# Patient Record
Sex: Male | Born: 1955
Health system: Southern US, Community
[De-identification: ages and names within clinical notes are randomized; demographics above are authoritative.]

## PROBLEM LIST (undated history)

## (undated) ENCOUNTER — Inpatient Hospital Stay: Admission: EM | Payer: Self-pay | Source: Home / Self Care

## (undated) DIAGNOSIS — H9190 Unspecified hearing loss, unspecified ear: Secondary | ICD-10-CM

## (undated) DIAGNOSIS — I252 Old myocardial infarction: Secondary | ICD-10-CM

## (undated) DIAGNOSIS — F329 Major depressive disorder, single episode, unspecified: Secondary | ICD-10-CM

## (undated) DIAGNOSIS — I10 Essential (primary) hypertension: Secondary | ICD-10-CM

## (undated) DIAGNOSIS — C679 Malignant neoplasm of bladder, unspecified: Secondary | ICD-10-CM

## (undated) DIAGNOSIS — G4730 Sleep apnea, unspecified: Secondary | ICD-10-CM

## (undated) DIAGNOSIS — I701 Atherosclerosis of renal artery: Secondary | ICD-10-CM

## (undated) DIAGNOSIS — H269 Unspecified cataract: Secondary | ICD-10-CM

## (undated) DIAGNOSIS — K219 Gastro-esophageal reflux disease without esophagitis: Secondary | ICD-10-CM

## (undated) DIAGNOSIS — N183 Chronic kidney disease, stage 3 unspecified: Secondary | ICD-10-CM

## (undated) DIAGNOSIS — E785 Hyperlipidemia, unspecified: Secondary | ICD-10-CM

## (undated) DIAGNOSIS — R3915 Urgency of urination: Secondary | ICD-10-CM

## (undated) DIAGNOSIS — K08409 Partial loss of teeth, unspecified cause, unspecified class: Secondary | ICD-10-CM

## (undated) DIAGNOSIS — Z8679 Personal history of other diseases of the circulatory system: Secondary | ICD-10-CM

## (undated) DIAGNOSIS — Z9989 Dependence on other enabling machines and devices: Secondary | ICD-10-CM

## (undated) DIAGNOSIS — D696 Thrombocytopenia, unspecified: Secondary | ICD-10-CM

## (undated) DIAGNOSIS — Z905 Acquired absence of kidney: Secondary | ICD-10-CM

## (undated) DIAGNOSIS — F411 Generalized anxiety disorder: Secondary | ICD-10-CM

## (undated) DIAGNOSIS — F32A Depression, unspecified: Secondary | ICD-10-CM

## (undated) DIAGNOSIS — Q600 Renal agenesis, unilateral: Secondary | ICD-10-CM

## (undated) DIAGNOSIS — J189 Pneumonia, unspecified organism: Secondary | ICD-10-CM

## (undated) DIAGNOSIS — Z794 Long term (current) use of insulin: Secondary | ICD-10-CM

## (undated) DIAGNOSIS — Z973 Presence of spectacles and contact lenses: Secondary | ICD-10-CM

## (undated) DIAGNOSIS — IMO0002 Reserved for concepts with insufficient information to code with codable children: Secondary | ICD-10-CM

## (undated) DIAGNOSIS — G620 Drug-induced polyneuropathy: Secondary | ICD-10-CM

## (undated) DIAGNOSIS — I201 Angina pectoris with documented spasm: Secondary | ICD-10-CM

## (undated) DIAGNOSIS — Z8554 Personal history of malignant neoplasm of ureter: Secondary | ICD-10-CM

## (undated) DIAGNOSIS — Z9221 Personal history of antineoplastic chemotherapy: Secondary | ICD-10-CM

## (undated) DIAGNOSIS — E119 Type 2 diabetes mellitus without complications: Secondary | ICD-10-CM

## (undated) DIAGNOSIS — G4733 Obstructive sleep apnea (adult) (pediatric): Secondary | ICD-10-CM

## (undated) DIAGNOSIS — R351 Nocturia: Secondary | ICD-10-CM

## (undated) DIAGNOSIS — M1990 Unspecified osteoarthritis, unspecified site: Secondary | ICD-10-CM

## (undated) DIAGNOSIS — C651 Malignant neoplasm of right renal pelvis: Secondary | ICD-10-CM

## (undated) DIAGNOSIS — I2510 Atherosclerotic heart disease of native coronary artery without angina pectoris: Secondary | ICD-10-CM

## (undated) DIAGNOSIS — T451X5A Adverse effect of antineoplastic and immunosuppressive drugs, initial encounter: Secondary | ICD-10-CM

## (undated) HISTORY — DX: Atherosclerotic heart disease of native coronary artery without angina pectoris: I25.10

## (undated) HISTORY — DX: Thrombocytopenia, unspecified: D69.6

## (undated) HISTORY — DX: Acquired absence of kidney: Z90.5

## (undated) HISTORY — PX: HERNIA REPAIR: SHX51

## (undated) HISTORY — PX: JOINT REPLACEMENT: SHX530

## (undated) HISTORY — DX: Unspecified cataract: H26.9

## (undated) HISTORY — PX: OTHER SURGICAL HISTORY: SHX169

## (undated) HISTORY — PX: COLON SURGERY: SHX602

## (undated) HISTORY — DX: Hyperlipidemia, unspecified: E78.5

## (undated) HISTORY — DX: Sleep apnea, unspecified: G47.30

## (undated) HISTORY — DX: Malignant neoplasm of right renal pelvis: C65.1

## (undated) HISTORY — DX: Atherosclerosis of renal artery: I70.1

## (undated) HISTORY — DX: Essential (primary) hypertension: I10

## (undated) HISTORY — DX: Malignant neoplasm of bladder, unspecified: C67.9

## (undated) HISTORY — PX: CARDIAC CATHETERIZATION: SHX172

---

## 2002-05-12 HISTORY — PX: OTHER SURGICAL HISTORY: SHX169

## 2002-11-24 ENCOUNTER — Encounter: Admission: RE | Admit: 2002-11-24 | Discharge: 2003-02-22 | Payer: Self-pay | Admitting: Internal Medicine

## 2003-03-20 ENCOUNTER — Encounter: Admission: RE | Admit: 2003-03-20 | Discharge: 2003-03-20 | Payer: Self-pay | Admitting: General Surgery

## 2003-04-28 ENCOUNTER — Other Ambulatory Visit: Admission: RE | Admit: 2003-04-28 | Discharge: 2003-04-28 | Payer: Self-pay | Admitting: Internal Medicine

## 2003-05-13 DIAGNOSIS — I252 Old myocardial infarction: Secondary | ICD-10-CM

## 2003-05-13 DIAGNOSIS — I219 Acute myocardial infarction, unspecified: Secondary | ICD-10-CM

## 2003-05-13 HISTORY — DX: Old myocardial infarction: I25.2

## 2003-05-13 HISTORY — DX: Acute myocardial infarction, unspecified: I21.9

## 2003-05-13 HISTORY — PX: SHOULDER SURGERY: SHX246

## 2003-06-07 ENCOUNTER — Encounter (INDEPENDENT_AMBULATORY_CARE_PROVIDER_SITE_OTHER): Payer: Self-pay | Admitting: Specialist

## 2003-06-07 ENCOUNTER — Ambulatory Visit (HOSPITAL_BASED_OUTPATIENT_CLINIC_OR_DEPARTMENT_OTHER): Admission: RE | Admit: 2003-06-07 | Discharge: 2003-06-07 | Payer: Self-pay | Admitting: Urology

## 2003-06-07 ENCOUNTER — Ambulatory Visit (HOSPITAL_COMMUNITY): Admission: RE | Admit: 2003-06-07 | Discharge: 2003-06-07 | Payer: Self-pay | Admitting: Urology

## 2003-07-07 ENCOUNTER — Ambulatory Visit (HOSPITAL_BASED_OUTPATIENT_CLINIC_OR_DEPARTMENT_OTHER): Admission: RE | Admit: 2003-07-07 | Discharge: 2003-07-07 | Payer: Self-pay | Admitting: Orthopedic Surgery

## 2003-07-07 ENCOUNTER — Ambulatory Visit (HOSPITAL_COMMUNITY): Admission: RE | Admit: 2003-07-07 | Discharge: 2003-07-07 | Payer: Self-pay | Admitting: Orthopedic Surgery

## 2003-07-07 HISTORY — PX: ELBOW SURGERY: SHX618

## 2003-09-08 ENCOUNTER — Inpatient Hospital Stay (HOSPITAL_COMMUNITY): Admission: AD | Admit: 2003-09-08 | Discharge: 2003-09-11 | Payer: Self-pay | Admitting: Internal Medicine

## 2003-11-02 ENCOUNTER — Inpatient Hospital Stay (HOSPITAL_COMMUNITY): Admission: EM | Admit: 2003-11-02 | Discharge: 2003-11-05 | Payer: Self-pay | Admitting: *Deleted

## 2003-11-26 ENCOUNTER — Encounter: Payer: Self-pay | Admitting: Pulmonary Disease

## 2003-11-26 ENCOUNTER — Ambulatory Visit (HOSPITAL_BASED_OUTPATIENT_CLINIC_OR_DEPARTMENT_OTHER): Admission: RE | Admit: 2003-11-26 | Discharge: 2003-11-26 | Payer: Self-pay | Admitting: Internal Medicine

## 2003-11-27 ENCOUNTER — Encounter (HOSPITAL_COMMUNITY): Admission: RE | Admit: 2003-11-27 | Discharge: 2004-02-25 | Payer: Self-pay | Admitting: Cardiology

## 2004-02-08 ENCOUNTER — Ambulatory Visit (HOSPITAL_COMMUNITY): Admission: RE | Admit: 2004-02-08 | Discharge: 2004-02-09 | Payer: Self-pay | Admitting: Orthopedic Surgery

## 2004-02-08 ENCOUNTER — Encounter (INDEPENDENT_AMBULATORY_CARE_PROVIDER_SITE_OTHER): Payer: Self-pay | Admitting: *Deleted

## 2004-02-08 HISTORY — PX: KNEE ARTHROSCOPY W/ DEBRIDEMENT: SHX1867

## 2004-02-26 ENCOUNTER — Encounter (HOSPITAL_COMMUNITY): Admission: RE | Admit: 2004-02-26 | Discharge: 2004-04-27 | Payer: Self-pay | Admitting: Cardiology

## 2004-04-25 ENCOUNTER — Ambulatory Visit: Payer: Self-pay | Admitting: Internal Medicine

## 2004-04-25 ENCOUNTER — Ambulatory Visit: Payer: Self-pay | Admitting: Pulmonary Disease

## 2004-05-23 ENCOUNTER — Ambulatory Visit: Payer: Self-pay | Admitting: Pulmonary Disease

## 2004-06-11 ENCOUNTER — Ambulatory Visit: Payer: Self-pay | Admitting: Internal Medicine

## 2004-08-03 ENCOUNTER — Inpatient Hospital Stay (HOSPITAL_COMMUNITY): Admission: EM | Admit: 2004-08-03 | Discharge: 2004-08-05 | Payer: Self-pay | Admitting: Emergency Medicine

## 2004-08-03 ENCOUNTER — Encounter (INDEPENDENT_AMBULATORY_CARE_PROVIDER_SITE_OTHER): Payer: Self-pay | Admitting: *Deleted

## 2004-08-03 HISTORY — PX: APPENDECTOMY: SHX54

## 2004-10-03 ENCOUNTER — Ambulatory Visit: Payer: Self-pay | Admitting: Internal Medicine

## 2004-11-23 ENCOUNTER — Emergency Department (HOSPITAL_COMMUNITY): Admission: EM | Admit: 2004-11-23 | Discharge: 2004-11-23 | Payer: Self-pay | Admitting: Emergency Medicine

## 2005-05-16 ENCOUNTER — Ambulatory Visit: Payer: Self-pay | Admitting: Internal Medicine

## 2005-07-11 ENCOUNTER — Ambulatory Visit: Payer: Self-pay | Admitting: Pulmonary Disease

## 2005-07-29 ENCOUNTER — Ambulatory Visit: Payer: Self-pay | Admitting: Cardiology

## 2005-07-31 ENCOUNTER — Inpatient Hospital Stay (HOSPITAL_BASED_OUTPATIENT_CLINIC_OR_DEPARTMENT_OTHER): Admission: RE | Admit: 2005-07-31 | Discharge: 2005-07-31 | Payer: Self-pay | Admitting: Internal Medicine

## 2005-07-31 ENCOUNTER — Ambulatory Visit: Payer: Self-pay | Admitting: Internal Medicine

## 2005-08-25 ENCOUNTER — Ambulatory Visit: Payer: Self-pay | Admitting: Internal Medicine

## 2005-10-08 ENCOUNTER — Ambulatory Visit: Payer: Self-pay | Admitting: Internal Medicine

## 2005-12-19 ENCOUNTER — Ambulatory Visit: Payer: Self-pay | Admitting: Internal Medicine

## 2006-02-06 ENCOUNTER — Ambulatory Visit: Payer: Self-pay | Admitting: Internal Medicine

## 2006-02-06 ENCOUNTER — Ambulatory Visit: Payer: Self-pay | Admitting: Gastroenterology

## 2006-02-11 ENCOUNTER — Ambulatory Visit: Payer: Self-pay | Admitting: Pulmonary Disease

## 2006-02-20 ENCOUNTER — Ambulatory Visit: Payer: Self-pay | Admitting: Gastroenterology

## 2006-02-20 ENCOUNTER — Encounter (INDEPENDENT_AMBULATORY_CARE_PROVIDER_SITE_OTHER): Payer: Self-pay | Admitting: Specialist

## 2006-06-01 ENCOUNTER — Ambulatory Visit (HOSPITAL_BASED_OUTPATIENT_CLINIC_OR_DEPARTMENT_OTHER): Admission: RE | Admit: 2006-06-01 | Discharge: 2006-06-01 | Payer: Self-pay | Admitting: Urology

## 2006-06-01 ENCOUNTER — Encounter (INDEPENDENT_AMBULATORY_CARE_PROVIDER_SITE_OTHER): Payer: Self-pay | Admitting: *Deleted

## 2006-08-27 ENCOUNTER — Ambulatory Visit: Payer: Self-pay | Admitting: Internal Medicine

## 2006-10-07 ENCOUNTER — Ambulatory Visit (HOSPITAL_BASED_OUTPATIENT_CLINIC_OR_DEPARTMENT_OTHER): Admission: RE | Admit: 2006-10-07 | Discharge: 2006-10-07 | Payer: Self-pay | Admitting: Urology

## 2006-10-07 ENCOUNTER — Encounter (INDEPENDENT_AMBULATORY_CARE_PROVIDER_SITE_OTHER): Payer: Self-pay | Admitting: Urology

## 2006-10-08 ENCOUNTER — Ambulatory Visit: Payer: Self-pay | Admitting: Internal Medicine

## 2006-12-14 ENCOUNTER — Ambulatory Visit: Payer: Self-pay | Admitting: Internal Medicine

## 2006-12-16 ENCOUNTER — Ambulatory Visit: Payer: Self-pay

## 2007-01-13 ENCOUNTER — Ambulatory Visit (HOSPITAL_BASED_OUTPATIENT_CLINIC_OR_DEPARTMENT_OTHER): Admission: RE | Admit: 2007-01-13 | Discharge: 2007-01-13 | Payer: Self-pay | Admitting: Urology

## 2007-01-13 ENCOUNTER — Encounter (INDEPENDENT_AMBULATORY_CARE_PROVIDER_SITE_OTHER): Payer: Self-pay | Admitting: Urology

## 2007-01-13 HISTORY — PX: TRANSURETHRAL RESECTION OF BLADDER TUMOR: SHX2575

## 2007-01-25 ENCOUNTER — Ambulatory Visit: Payer: Self-pay | Admitting: Internal Medicine

## 2007-01-25 LAB — CONVERTED CEMR LAB
ALT: 54 units/L — ABNORMAL HIGH (ref 0–53)
AST: 34 units/L (ref 0–37)
Albumin: 4 g/dL (ref 3.5–5.2)
Alkaline Phosphatase: 38 units/L — ABNORMAL LOW (ref 39–117)
Bilirubin, Direct: 0.1 mg/dL (ref 0.0–0.3)
Cholesterol: 103 mg/dL (ref 0–200)
HDL: 22.3 mg/dL — ABNORMAL LOW (ref >39.0)
LDL Cholesterol: 57 mg/dL (ref 0–99)
Total Bilirubin: 0.8 mg/dL (ref 0.3–1.2)
Total CHOL/HDL Ratio: 4.6
Total Protein: 6.6 g/dL (ref 6.0–8.3)
Triglycerides: 118 mg/dL (ref 0–149)
VLDL: 24 mg/dL (ref 0–40)

## 2007-04-19 ENCOUNTER — Ambulatory Visit (HOSPITAL_BASED_OUTPATIENT_CLINIC_OR_DEPARTMENT_OTHER): Admission: RE | Admit: 2007-04-19 | Discharge: 2007-04-19 | Payer: Self-pay | Admitting: Urology

## 2007-04-19 ENCOUNTER — Encounter (INDEPENDENT_AMBULATORY_CARE_PROVIDER_SITE_OTHER): Payer: Self-pay | Admitting: Urology

## 2007-05-17 ENCOUNTER — Ambulatory Visit (HOSPITAL_COMMUNITY): Admission: RE | Admit: 2007-05-17 | Discharge: 2007-05-18 | Payer: Self-pay | Admitting: Otolaryngology

## 2007-05-17 HISTORY — PX: OTHER SURGICAL HISTORY: SHX169

## 2007-06-14 ENCOUNTER — Ambulatory Visit: Payer: Self-pay | Admitting: Internal Medicine

## 2007-06-17 ENCOUNTER — Inpatient Hospital Stay (HOSPITAL_BASED_OUTPATIENT_CLINIC_OR_DEPARTMENT_OTHER): Admission: RE | Admit: 2007-06-17 | Discharge: 2007-06-17 | Payer: Self-pay | Admitting: Internal Medicine

## 2007-06-17 ENCOUNTER — Ambulatory Visit: Payer: Self-pay | Admitting: Internal Medicine

## 2007-07-09 ENCOUNTER — Ambulatory Visit: Payer: Self-pay | Admitting: Internal Medicine

## 2007-08-13 ENCOUNTER — Ambulatory Visit (HOSPITAL_BASED_OUTPATIENT_CLINIC_OR_DEPARTMENT_OTHER): Admission: RE | Admit: 2007-08-13 | Discharge: 2007-08-13 | Payer: Self-pay | Admitting: Urology

## 2007-08-13 ENCOUNTER — Encounter (INDEPENDENT_AMBULATORY_CARE_PROVIDER_SITE_OTHER): Payer: Self-pay | Admitting: Urology

## 2007-11-04 ENCOUNTER — Encounter: Payer: Self-pay | Admitting: Pulmonary Disease

## 2007-11-16 DIAGNOSIS — I10 Essential (primary) hypertension: Secondary | ICD-10-CM | POA: Insufficient documentation

## 2007-11-16 DIAGNOSIS — G4733 Obstructive sleep apnea (adult) (pediatric): Secondary | ICD-10-CM | POA: Insufficient documentation

## 2007-11-17 ENCOUNTER — Ambulatory Visit: Payer: Self-pay | Admitting: Pulmonary Disease

## 2007-11-17 DIAGNOSIS — G2581 Restless legs syndrome: Secondary | ICD-10-CM | POA: Insufficient documentation

## 2007-11-19 ENCOUNTER — Encounter: Payer: Self-pay | Admitting: Pulmonary Disease

## 2007-11-25 ENCOUNTER — Ambulatory Visit: Payer: Self-pay | Admitting: Internal Medicine

## 2007-12-20 ENCOUNTER — Encounter: Payer: Self-pay | Admitting: Pulmonary Disease

## 2007-12-30 ENCOUNTER — Ambulatory Visit: Payer: Self-pay | Admitting: Pulmonary Disease

## 2008-02-16 ENCOUNTER — Ambulatory Visit (HOSPITAL_BASED_OUTPATIENT_CLINIC_OR_DEPARTMENT_OTHER): Admission: RE | Admit: 2008-02-16 | Discharge: 2008-02-16 | Payer: Self-pay | Admitting: Urology

## 2008-02-16 HISTORY — PX: OTHER SURGICAL HISTORY: SHX169

## 2008-02-24 ENCOUNTER — Telehealth (INDEPENDENT_AMBULATORY_CARE_PROVIDER_SITE_OTHER): Payer: Self-pay | Admitting: *Deleted

## 2008-03-31 ENCOUNTER — Ambulatory Visit: Payer: Self-pay | Admitting: Internal Medicine

## 2008-05-10 DIAGNOSIS — C679 Malignant neoplasm of bladder, unspecified: Secondary | ICD-10-CM | POA: Insufficient documentation

## 2008-05-10 DIAGNOSIS — E785 Hyperlipidemia, unspecified: Secondary | ICD-10-CM | POA: Insufficient documentation

## 2008-05-10 DIAGNOSIS — I2510 Atherosclerotic heart disease of native coronary artery without angina pectoris: Secondary | ICD-10-CM | POA: Insufficient documentation

## 2008-05-15 ENCOUNTER — Ambulatory Visit: Payer: Self-pay | Admitting: Internal Medicine

## 2008-05-30 ENCOUNTER — Encounter: Payer: Self-pay | Admitting: Pulmonary Disease

## 2008-08-28 ENCOUNTER — Telehealth: Payer: Self-pay | Admitting: Internal Medicine

## 2008-09-13 ENCOUNTER — Encounter (INDEPENDENT_AMBULATORY_CARE_PROVIDER_SITE_OTHER): Payer: Self-pay | Admitting: Urology

## 2008-09-13 ENCOUNTER — Ambulatory Visit (HOSPITAL_BASED_OUTPATIENT_CLINIC_OR_DEPARTMENT_OTHER): Admission: RE | Admit: 2008-09-13 | Discharge: 2008-09-13 | Payer: Self-pay | Admitting: Urology

## 2008-09-25 ENCOUNTER — Encounter: Payer: Self-pay | Admitting: Internal Medicine

## 2008-10-01 ENCOUNTER — Encounter: Payer: Self-pay | Admitting: Internal Medicine

## 2008-10-11 ENCOUNTER — Encounter (INDEPENDENT_AMBULATORY_CARE_PROVIDER_SITE_OTHER): Payer: Self-pay | Admitting: Urology

## 2008-10-11 ENCOUNTER — Ambulatory Visit (HOSPITAL_BASED_OUTPATIENT_CLINIC_OR_DEPARTMENT_OTHER): Admission: RE | Admit: 2008-10-11 | Discharge: 2008-10-11 | Payer: Self-pay | Admitting: Urology

## 2008-10-11 HISTORY — PX: OTHER SURGICAL HISTORY: SHX169

## 2008-10-15 ENCOUNTER — Emergency Department (HOSPITAL_COMMUNITY): Admission: EM | Admit: 2008-10-15 | Discharge: 2008-10-15 | Payer: Self-pay | Admitting: Emergency Medicine

## 2008-10-30 ENCOUNTER — Encounter (INDEPENDENT_AMBULATORY_CARE_PROVIDER_SITE_OTHER): Payer: Self-pay | Admitting: Urology

## 2008-10-30 ENCOUNTER — Inpatient Hospital Stay (HOSPITAL_COMMUNITY): Admission: RE | Admit: 2008-10-30 | Discharge: 2008-11-08 | Payer: Self-pay | Admitting: Urology

## 2008-10-30 HISTORY — PX: OTHER SURGICAL HISTORY: SHX169

## 2008-11-20 ENCOUNTER — Ambulatory Visit: Payer: Self-pay | Admitting: Internal Medicine

## 2009-01-22 ENCOUNTER — Ambulatory Visit: Payer: Self-pay | Admitting: Internal Medicine

## 2009-01-24 ENCOUNTER — Encounter (INDEPENDENT_AMBULATORY_CARE_PROVIDER_SITE_OTHER): Payer: Self-pay | Admitting: *Deleted

## 2009-02-19 ENCOUNTER — Telehealth (INDEPENDENT_AMBULATORY_CARE_PROVIDER_SITE_OTHER): Payer: Self-pay | Admitting: *Deleted

## 2009-02-21 ENCOUNTER — Encounter (INDEPENDENT_AMBULATORY_CARE_PROVIDER_SITE_OTHER): Payer: Self-pay | Admitting: Urology

## 2009-02-21 ENCOUNTER — Ambulatory Visit (HOSPITAL_BASED_OUTPATIENT_CLINIC_OR_DEPARTMENT_OTHER): Admission: RE | Admit: 2009-02-21 | Discharge: 2009-02-21 | Payer: Self-pay | Admitting: Urology

## 2009-02-21 HISTORY — PX: OTHER SURGICAL HISTORY: SHX169

## 2009-03-19 ENCOUNTER — Ambulatory Visit: Payer: Self-pay | Admitting: Internal Medicine

## 2009-04-02 ENCOUNTER — Ambulatory Visit: Payer: Self-pay | Admitting: Internal Medicine

## 2009-05-28 ENCOUNTER — Telehealth: Payer: Self-pay | Admitting: Pulmonary Disease

## 2009-06-05 ENCOUNTER — Ambulatory Visit: Payer: Self-pay | Admitting: Pulmonary Disease

## 2009-08-08 ENCOUNTER — Telehealth: Payer: Self-pay | Admitting: Internal Medicine

## 2009-09-11 ENCOUNTER — Telehealth: Payer: Self-pay | Admitting: Pulmonary Disease

## 2009-09-17 ENCOUNTER — Ambulatory Visit: Payer: Self-pay | Admitting: Internal Medicine

## 2009-09-19 ENCOUNTER — Encounter: Payer: Self-pay | Admitting: Pulmonary Disease

## 2009-10-02 ENCOUNTER — Ambulatory Visit: Payer: Self-pay | Admitting: Internal Medicine

## 2009-10-02 ENCOUNTER — Encounter (INDEPENDENT_AMBULATORY_CARE_PROVIDER_SITE_OTHER): Payer: Self-pay | Admitting: *Deleted

## 2009-10-02 LAB — CONVERTED CEMR LAB
Cholesterol: 93 mg/dL
HDL: 33 mg/dL
LDL Cholesterol: 35 mg/dL
Triglycerides: 126 mg/dL

## 2009-10-22 ENCOUNTER — Ambulatory Visit: Payer: Self-pay | Admitting: Internal Medicine

## 2009-12-28 ENCOUNTER — Encounter: Payer: Self-pay | Admitting: Pulmonary Disease

## 2009-12-28 ENCOUNTER — Telehealth: Payer: Self-pay | Admitting: Pulmonary Disease

## 2010-03-25 ENCOUNTER — Ambulatory Visit: Payer: Self-pay | Admitting: Internal Medicine

## 2010-06-04 ENCOUNTER — Encounter: Payer: Self-pay | Admitting: Pulmonary Disease

## 2010-06-04 ENCOUNTER — Ambulatory Visit
Admission: RE | Admit: 2010-06-04 | Discharge: 2010-06-04 | Payer: Self-pay | Source: Home / Self Care | Attending: Pulmonary Disease | Admitting: Pulmonary Disease

## 2010-06-04 DIAGNOSIS — G4733 Obstructive sleep apnea (adult) (pediatric): Secondary | ICD-10-CM | POA: Insufficient documentation

## 2010-06-11 NOTE — Assessment & Plan Note (Signed)
Summary: rov for osa, RLS   Primary Provider/Referring Provider:  [name]  CC:  Yearly Follow up.  states he is wearing cpap 4-5 nights/wk for approx 8hours each night.  c/o the mask rubbing a sore on nose and states his mouth is very dry when using the cpap even with the humidifier.  states he believes pressure needs to be increased.  also would like to discuss requip dosage-believes this needs to be increased.Marland Kitchen  History of Present Illness: The pt comes in today for f/u of his known osa.  He has been managed on cpap, but has ongoing issues with mask fit and excoriation over the bridge of his nose.  He requires a full face mask due to mouth opening.  He has been wearing cpap compliantly, and his only compliant is that of dryness and ?inadequate pressure.  He uses heated humidifier at highest temperature, but still gets dry.  He has tried a sleeve for his cpap hose as well.  His machine is 55 years old, and may need to be replaced.  He feels that he sleeps fairly well, and denies any significant EDS.  His weight is up 20 pounds over the last one year.  The pt is also on requip for rls, and feels that he is having breakthru in the later evening.  He currently takes his dose in early evening.  Current Medications (verified): 1)  Crestor 10 Mg Tabs (Rosuvastatin Calcium) .... Take 1 Tablet By Mouth At Bedtime 2)  Wellbutrin Xl 300 Mg  Tb24 (Bupropion Hcl) .... Once Daily 3)  Cvs Aspirin Ec 325 Mg  Tbec (Aspirin) .... Once Daily 4)  Protonix 40 Mg  Pack (Pantoprazole Sodium) .... Once Daily 5)  Norvasc 5 Mg  Tabs (Amlodipine Besylate) .... Two Times A Day 6)  Actos 45 Mg  Tabs (Pioglitazone Hcl) .... Once Daily 7)  Toprol Xl 25 Mg  Tb24 (Metoprolol Succinate) .... Once Daily 8)  Requip 1 Mg  Tabs (Ropinirole Hcl) .... Once Daily 9)  Multivitamins   Caps (Multiple Vitamin) .... Take 1 Tablet By Mouth Once A Day 10)  Cpap 11)  Imdur 120 Mg  Tb24 (Isosorbide Mononitrate) .... Once Daily 12)  Janumet ....  Take 1 Tablet By Mouth Once A Day 13)  Glyburide 5 Mg Tabs (Glyburide) .... Take 1 By Mouth Once Daily  Allergies (verified): No Known Drug Allergies  Review of Systems      See HPI  Vital Signs:  Patient profile:   55 year old male Height:      70 inches Weight:      228.38 pounds BMI:     32.89 O2 Sat:      95 % on Room air Temp:     98.3 degrees F oral Pulse rate:   63 / minute BP sitting:   114 / 78  (left arm) Cuff size:   regular  Vitals Entered By: Raymondo Band RN (June 05, 2009 10:24 AM)  O2 Flow:  Room air CC: Yearly Follow up.  states he is wearing cpap 4-5 nights/wk for approx 8hours each night.  c/o the mask rubbing a sore on nose and states his mouth is very dry when using the cpap even with the humidifier.  states he believes pressure needs to be increased.  also would like to discuss requip dosage-believes this needs to be increased. Comments Medications reviewed with patient Daytime contact number verified with patient. Raymondo Band RN  June 05, 2009 10:31 AM  Physical Exam  General:  ow male in nad Nose:  no skin breakdown or pressure necrosis from cpap mask, but there is mild erythema on bridge of nose. Neurologic:  alert, not sleepy, moves all 4.   Impression & Recommendations:  Problem # 1:  SLEEP APNEA (ICD-780.57) the pt has been a very difficult mask fit, and I would like to try a sleep comfort pad for the bridge of his nose for now.  Will also see if he qualifies for a new cpap machine, and can recheck his pressure with autotitrating mode for optimization.  I have stressed to him the need to work on weight loss.  Problem # 2:  RESTLESS LEGS SYNDROME (ICD-333.94) the pt is having a mild increase in symptoms in the evening.  Will try to increase his requip just a little, but if continues, would like to check iron panel for completeness.  It is unclear if his recent increase in symptoms are truly due to RLS.  Medications Added to Medication  List This Visit: 1)  Requip 0.5 Mg Tabs (Ropinirole hcl) .... As directed to complete 1.5 mg dose at bedtime.  Other Orders: Est. Patient Level III [id number]) DME Referral (DME) DME Referral (DME)  Patient Instructions: 1)  can try taking one and a half mg of requip to see if better control of symptoms.  Will send a 30 day prescription for 0.5mg  to try with your 1mg  tab. 2)  will get sleep comfort care pad to help with sore nose. 3)  will see if we can get you a new machine 4)  work on weight loss 5)  follow up with me in one year   Prescriptions: REQUIP 0.5 MG  TABS (ROPINIROLE HCL) as directed to complete 1.5 mg dose at bedtime.  #30 x 0   Entered and Authorized by:   Kathee Delton MD   Signed by:   Kathee Delton MD on 06/05/2009   Method used:   Electronically to        [facility] (mail-order)             ,          Ph: [phone]       Fax: [phone]   [id number] REQUIP 1 MG  TABS (ROPINIROLE HCL) once daily  #90 x 4   Entered and Authorized by:   Kathee Delton MD   Signed by:   Kathee Delton MD on 06/05/2009   Method used:   Electronically to        [facility] (mail-order)             ,          Ph: [phone]       Fax: [phone]   RxID:   [id number]     Immunization History:  Influenza Immunization History:    Influenza:  historical (02/09/2009)  Pneumovax Immunization History:    Pneumovax:  historical (11/09/2008)

## 2010-06-11 NOTE — Miscellaneous (Signed)
Summary: huge mask leak on auto download.   Clinical Lists Changes  Orders: Added new Referral order of DME Referral (DME) - Signed auto shows optimal pressure at 10cm, huge leak, and adequate compliance. needs mask fit and auto for 2 weeks redone.

## 2010-06-11 NOTE — Miscellaneous (Signed)
Summary: lipids  Clinical Lists Changes  Observations: Added new observation of LDL: 35 mg/dL (10/02/2009 12:26) Added new observation of HDL: 33 mg/dL (10/02/2009 12:26) Added new observation of TRIGLYC TOT: 126 mg/dL (10/02/2009 12:26) Added new observation of CHOLESTEROL: 93 mg/dL (10/02/2009 12:26)

## 2010-06-11 NOTE — Assessment & Plan Note (Signed)
Summary: per check out/sf  Medications Added METOPROLOL SUCCINATE 25 MG XR24H-TAB (METOPROLOL SUCCINATE) 1 tab every day      Allergies Added: NKDA  Primary Provider:  [name]   History of Present Illness: Patient is a 55 year old with a history of mild CAD and probable microvascular dz with TIMII II flow in LAD and LCx.  Also a history of DM, dyslipidemia, HTN and bladder CA.  I las saw him in the spring. Currently he is doing fairly well.  He has infrequent chest discomfort.   If he goes on walk he will notice discomfort early that goes away with slowing. Breathing is OK.   Current Medications (verified): 1)  Crestor 10 Mg Tabs (Rosuvastatin Calcium) .... Take 1 Tablet By Mouth At Bedtime 2)  Wellbutrin Xl 300 Mg  Tb24 (Bupropion Hcl) .... Once Daily 3)  Cvs Aspirin Ec 325 Mg  Tbec (Aspirin) .... Once Daily 4)  Protonix 40 Mg  Pack (Pantoprazole Sodium) .... Once Daily 5)  Norvasc 5 Mg  Tabs (Amlodipine Besylate) .... Two Times A Day 6)  Actos 45 Mg  Tabs (Pioglitazone Hcl) .... Once Daily 7)  Toprol Xl 25 Mg  Tb24 (Metoprolol Succinate) .... Once Daily 8)  Requip 1 Mg  Tabs (Ropinirole Hcl) .... Once Daily 9)  Multivitamins   Caps (Multiple Vitamin) .... Take 1 Tablet By Mouth Once A Day 10)  Cpap 11)  Imdur 120 Mg  Tb24 (Isosorbide Mononitrate) .... Once Daily 12)  Janumet .... Take 1 Tablet By Mouth Once A Day 13)  Requip 0.5 Mg  Tabs (Ropinirole Hcl) .... As Directed To Complete 1.5 Mg Dose At Bedtime. 14)  Nitrostat 0.4 Mg Subl (Nitroglycerin) .... Dissolve 1 Tablet Under Tongue Every Five Minutes. Not To Exceed 3 Tablets in Fifteen Minutes As Needed  Allergies (verified): No Known Drug Allergies  Past History:  Past medical, surgical, family and social histories (including risk factors) reviewed, and no changes noted (except as noted below).  Past Medical History: Reviewed history from 05/10/2008 and no changes required. Coronary Artery  Disease Diabetes Hypertension Sleep Apnea Bladder CA followed by dr. [name] Dyslipidema Does not tolerate Niaspan  Past Surgical History: Reviewed history from 05/10/2008 and no changes required. Cystoscopys with tumor resections Umbilical hernia repair [id number] Elbow surgery (R) 2005 Shoulder surgery (R) 2005  Family History: Reviewed history from 05/10/2008 and no changes required. Mom died of CO poisoning Dad with CAD  Social History: Reviewed history from 05/10/2008 and no changes required. Single.  Disabliity from [employer] Occasional EtOH.  Quit tobacco 2003  Review of Systems       All systmes reviewed.  Neg to the above problem except as noted above.  Vital Signs:  Patient profile:   55 year old male Height:      70 inches Weight:      214 pounds BMI:     30.82 Pulse rate:   59 / minute BP sitting:   104 / 72  (left arm) Cuff size:   regular  Vitals Entered By: Hansel Feinstein CMA (March 25, 2010 3:41 PM)  Physical Exam  Additional Exam:  Patient is in NAD HEENT:  Normocephalic, atraumatic. EOMI, PERRLA.  Neck: JVP is normal. No thyromegaly. No bruits.  Lungs: clear to auscultation. No rales no wheezes.  Heart: Regular rate and rhythm. Normal S1, S2. No S3.   No significant murmurs. PMI not displaced.  Abdomen:  Supple, nontender. Normal bowel sounds. No masses. No hepatomegaly.  Extremities:   Good distal pulses throughout. No lower extremity edema.  Musculoskeletal :moving all extremities.  Neuro:   alert and oriented x3.    EKG  Procedure date:  03/25/2010  Findings:      Sinus bradycardia.  59 bpm.  Incomplete RBBB>  Septal MI.  Impression & Recommendations:  Problem # 1:  CAD, NATIVE VESSEL (ICD-414.01) Patient with probable microvasc disease.  I would keeep him on the same regimen.  He can takie NTG before exerciese to help with his endurance.  I encouraged him to stay active.  Problem # 2:  HYPERTENSION (ICD-401.9) Good control. The  following medications were removed from the medication list:    Toprol Xl 25 Mg Tb24 (Metoprolol succinate) ..... Once daily His updated medication list for this problem includes:    Cvs Aspirin Ec 325 Mg Tbec (Aspirin) ..... Once daily    Norvasc 5 Mg Tabs (Amlodipine besylate) .Marland Kitchen..Marland Kitchen Two times a day    Metoprolol Succinate 25 Mg Xr24h-tab (Metoprolol succinate) .Marland Kitchen... 1 tab every day  Problem # 3:  HYPERLIPIDEMIA-MIXED (P102836.4) Lidpids were excelllent.  He can decreased Crestor to 5 mg.  F/U lipid and AST in 3 months.  Waiting for Lipitor to go generic. His updated medication list for this problem includes:    Crestor 10 Mg Tabs (Rosuvastatin calcium) .Marland Kitchen... Take 1 tablet by mouth at bedtime  Other Orders: EKG w/ Interpretation (93000)  Patient Instructions: 1)  Your physician recommends that you return for a FASTING lipid profile: Lipid and ast 272.0 in Feb 2012 2)  Your physician wants you to follow-up in: 12 months with Dr.Ross  You will receive a reminder letter in the mail two months in advance. If you don't receive a letter, please call our office to schedule the follow-up appointment. Prescriptions: CRESTOR 10 MG TABS (ROSUVASTATIN CALCIUM) Take 1 tablet by mouth at bedtime  #90 x 3   Entered by:   Francetta Found, RN, BSN   Authorized by:   Annye Rusk, MD, [facility]   Signed by:   Francetta Found, RN, BSN on 03/25/2010   Method used:   Faxed to ...       [facility] (mail-order)             , [place]         Ph: [phone]       Fax: [phone]   [id number] METOPROLOL SUCCINATE 25 MG XR24H-TAB (METOPROLOL SUCCINATE) 1 tab every day  #90 x 3   Entered by:   Francetta Found, RN, BSN   Authorized by:   Annye Rusk, MD, [facility]   Signed by:   Francetta Found, RN, BSN on 03/25/2010   Method used:   Faxed to ...       [facility] (mail-order)             , [place]         Ph: [phone]       Fax: [phone]   RxID:   [id number]

## 2010-06-11 NOTE — Progress Notes (Signed)
Summary: cpap pressure ----LMTCB for Isaac [name] w/ [facility]  Phone Note Call from Patient Call back at [facility] Phone [phone]   Caller: Patient Call For: Isaac [name] Summary of Call: pt requests to have pressure changed on cpap. says it's still on "test mode".  call pt's home # or cell [phone] Initial call taken by: Cooper Render, CNA,  Sep 11, 2009 4:35 PM  Follow-up for Phone Call        LMOMTCBx1.  [name] LPN  May  3, 624THL 075-GRM PM   The patient states he had 2 week auto dwnload in Feb 2011 and has never been informed of those results. His machine is also still on test mode. He spoke with Isaac [name] at [facility] and she says she faxed the results on 07/23/2009. The patient gave me her number, [phone], and I have left a msg for her to call so we can have results refaxed to the office for [facility]. LMOMTCB for Isaac [name] with [facility]. Follow-up by: Francesca Jewett CMA,  Sep 12, 2009 9:55 AM  Additional Follow-up for Phone Call Additional follow up Details #1::        Isaac [name] is refaxing this download to [facility]. Iasked her to fax it to [phone], attn: Isaac [name]. Isaac [name] pls be on the look out for this. Francesca Jewett CMA  Sep 12, 2009 1:18 PM    Additional Follow-up for Phone Call Additional follow up Details #2::    spoke with Anette at [facility].  was informed that Isaac [name] is out of the office this week on vacation and won't return until Monday and no one else in the office knows how to get the download.  Will call back on Monday and speak to [place].  [name] LPN  May  6, 624THL [id number] AM   spoke with Holley Raring and [facility] and was informed Isaac [name] is still out of office today.  However, Holley Raring was able to pull up download and fax to me.  [name] LPN  May 9, 624THL QA348G AM    Still haven't received download.  called and spoke with Isaac [name] from [facility] and asked her to refax download.  [name] LPN  May 11, 624THL [id number] AM   received download and put in [facility] very important look at folder.   [name] LPN  May 11, 624THL 075-GRM AM   Additional Follow-up for Phone Call Additional follow up Details #3:: Details for Additional Follow-up Action Taken: noted.  will review Additional Follow-up by: Kathee Delton MD,  Sep 19, 2009 2:23 PM

## 2010-06-11 NOTE — Progress Notes (Signed)
Summary: refill   Phone Note Refill Request Call back at Home Phone [phone] Message from:  Patient on August 08, 2009 2:31 PM  Refills Requested: Medication #1:  TOPROL XL 25 MG  TB24 once daily  Medication #2:  CRESTOR 10 MG TABS Take 1 tablet by mouth at bedtime Send to [facility]   Initial call taken by: Delsa Sale,  August 08, 2009 2:32 PM    Prescriptions: TOPROL XL 25 MG  TB24 (METOPROLOL SUCCINATE) once daily  #90 x 3   Entered by:   Burnett Kanaris   Authorized by:   Annye Rusk, MD, [facility]   Signed by:   Burnett Kanaris on 08/10/2009   Method used:   Electronically to        [facility] (mail-order)             ,          Ph: [phone]       Fax: [phone]   [id number] CRESTOR 10 MG TABS (ROSUVASTATIN CALCIUM) Take 1 tablet by mouth at bedtime  #90 x 3   Entered by:   Burnett Kanaris   Authorized by:   Annye Rusk, MD, [facility]   Signed by:   Burnett Kanaris on 08/10/2009   Method used:   Electronically to        [facility] (mail-order)             ,          Ph: [phone]       Fax: [phone]   [id number]

## 2010-06-11 NOTE — Progress Notes (Signed)
Summary: c pap pressure  Phone Note Call from Patient   Caller: Patient Call For: clance Summary of Call: need prescript sent to [facility] for c pap pressure setting Initial call taken by: Gustavus Bryant,  December 28, 2009 1:10 PM  Follow-up for Phone Call        Pt states that he saw [facility] in jan/feb-had auto done-leak; redid test-july 14th faxed results to [facility]-needs pressure set through [facility]. Glenda in [place] office as contact if needed. Pt states he has been on auto mode since July. Please send order for pt to have setting placed. Thanks.Clayborne Dana CMA  December 28, 2009 2:21 PM   Additional Follow-up for Phone Call Additional follow up Details #1::        see clinical list update. Additional Follow-up by: Kathee Delton MD,  December 28, 2009 3:44 PM

## 2010-06-11 NOTE — Progress Notes (Signed)
Summary: prescript  Phone Note Call from Patient   Caller: Patient Call For: clance Summary of Call: need prescript for requip faxed to [facility] Initial call taken by: Gustavus Bryant,  May 28, 2009 1:39 PM  Follow-up for Phone Call        advised pt the he hasn't been seen since 12/2007 so we needed to get him set up for appt before we can give refills. Pt states he didnt realize it had been that long. Pt is scheduled to see [facility] on 06/05/09 at 10:15. Refill sent. pt aware needs to keep this appt for further refills. [place] Bing CMA  May 28, 2009 2:22 PM     Prescriptions: REQUIP 1 MG  TABS (ROPINIROLE HCL) once daily  #90 x 0   Entered by:   [place] Bing CMA   Authorized by:   Kathee Delton MD   Signed by:   [place] Bing CMA on 05/28/2009   Method used:   Electronically to        [facility] (mail-order)             ,          Ph: [phone]       Fax: [phone]   [id number]

## 2010-06-11 NOTE — Miscellaneous (Signed)
Summary: auto shows poor compliance and optimal pressure 12cm   Clinical Lists Changes  Orders: Added new Referral order of DME Referral (DME) - Signed auto shows that pt wore cpap only 18 days out of 30, and only 53% of time more than 4 hours. needs to improve compliance.  no signif. leak.   optimal pressure 12cm.

## 2010-06-19 ENCOUNTER — Encounter: Payer: Self-pay | Admitting: Pulmonary Disease

## 2010-06-19 ENCOUNTER — Other Ambulatory Visit (INDEPENDENT_AMBULATORY_CARE_PROVIDER_SITE_OTHER): Payer: Medicare Other

## 2010-06-19 ENCOUNTER — Other Ambulatory Visit: Payer: Self-pay

## 2010-06-19 DIAGNOSIS — E785 Hyperlipidemia, unspecified: Secondary | ICD-10-CM

## 2010-06-19 DIAGNOSIS — R0989 Other specified symptoms and signs involving the circulatory and respiratory systems: Secondary | ICD-10-CM

## 2010-06-19 LAB — LIPID PANEL
Cholesterol: 94 mg/dL (ref 0–200)
HDL: 19.9 mg/dL — ABNORMAL LOW (ref >39.00)
LDL Cholesterol: 35 mg/dL (ref 0–99)
Total CHOL/HDL Ratio: 5
Triglycerides: 195 mg/dL — ABNORMAL HIGH (ref 0.0–149.0)
VLDL: 39 mg/dL (ref 0.0–40.0)

## 2010-06-19 LAB — HEPATIC FUNCTION PANEL
ALT: 79 U/L — ABNORMAL HIGH (ref 0–53)
AST: 50 U/L — ABNORMAL HIGH (ref 0–37)
Albumin: 4.2 g/dL (ref 3.5–5.2)
Alkaline Phosphatase: 58 U/L (ref 39–117)
Bilirubin, Direct: 0.2 mg/dL (ref 0.0–0.3)
Total Bilirubin: 0.8 mg/dL (ref 0.3–1.2)
Total Protein: 6.6 g/dL (ref 6.0–8.3)

## 2010-06-19 NOTE — Medication Information (Signed)
Summary: [facility]  Rop[facility]   Imported By: Bubba Hales 06/11/2010 11:29:03  _____________________________________________________________________  External Attachment:    Type:   Image     Comment:   External Document

## 2010-06-19 NOTE — Assessment & Plan Note (Signed)
Summary: Isaac [name], Isaac [name]   Primary Provider/Referring Provider:  [name]  CC:  1 year f/u appt on [name] and Isaac [name].  Pt states Requip is helping with Isaac [name].  states he wears his cpap machine every night.  Approx 6 to 7 hours per night.  C/o problems with mask leaking.  Denies any complaints with pressure.  Marland Kitchen  History of Present Illness: the pt comes in today for f/u of his [name] and Isaac [name].  He is doing well with cpap, and has lost weight since his last visit.  He denies any issues with the cpap pressure, but is having some mask leaks.  He is due for a new set of seals.  He feels that he sleeps well, and is satisfied with his daytime alertness.  He continues a dopamine agonist for his Isaac [name], and is not having issues with this.    Medications Prior to Update: 1)  Crestor 10 Mg Tabs (Rosuvastatin Calcium) .... Take 1 Tablet By Mouth At Bedtime 2)  Wellbutrin Xl 300 Mg  Tb24 (Bupropion Hcl) .... Once Daily 3)  Cvs Aspirin Ec 325 Mg  Tbec (Aspirin) .... Once Daily 4)  Protonix 40 Mg  Pack (Pantoprazole Sodium) .... Once Daily 5)  Norvasc 5 Mg  Tabs (Amlodipine Besylate) .... Two Times A Day 6)  Actos 45 Mg  Tabs (Pioglitazone Hcl) .... Once Daily 7)  Requip 1 Mg  Tabs (Ropinirole Hcl) .... Once Daily 8)  Requip 0.5 Mg  Tabs (Ropinirole Hcl) .... As Directed To Complete 1.5 Mg Dose At Bedtime. 9)  Multivitamins   Caps (Multiple Vitamin) .... Take 1 Tablet By Mouth Once A Day 10)  Cpap 11)  Imdur 120 Mg  Tb24 (Isosorbide Mononitrate) .... Once Daily 12)  Janumet .... Take 1 Tablet By Mouth Once A Day 13)  Nitrostat 0.4 Mg Subl (Nitroglycerin) .... Dissolve 1 Tablet Under Tongue Every Five Minutes. Not To Exceed 3 Tablets in Fifteen Minutes As Needed 14)  Metoprolol Succinate 25 Mg Xr24h-Tab (Metoprolol Succinate) .Marland Kitchen.. 1 Tab Every Day  Allergies (verified): No Known Drug Allergies  Review of Systems       The patient complains of shortness of breath with activity, productive cough, chest pain, acid  heartburn, nasal congestion/difficulty breathing through nose, sneezing, and ear ache.  The patient denies shortness of breath at rest, non-productive cough, coughing up blood, irregular heartbeats, indigestion, loss of appetite, weight change, abdominal pain, difficulty swallowing, sore throat, tooth/dental problems, headaches, itching, anxiety, depression, hand/feet swelling, joint stiffness or pain, rash, change in color of mucus, and fever.    Vital Signs:  Patient profile:   55 year old male Height:      70 inches Weight:      215.50 pounds BMI:     31.03 O2 Sat:      98 % on Room air Temp:     98.0 degrees F oral Pulse rate:   71 / minute BP sitting:   124 / 70  (right arm) Cuff size:   regular  Vitals Entered By: [name] LPN (January 24, [id number] 10:08 AM)  O2 Flow:  Room air CC: 1 year f/u appt on [name] and Isaac [name].  Pt states Requip is helping with Isaac [name].  states he wears his cpap machine every night.  Approx 6 to 7 hours per night.  C/o problems with mask leaking.  Denies any complaints with pressure.   Comments Medications reviewed with patient [name] LPN  January 24, [id number] 10:10 AM  Physical Exam  General:  ow male in nad Nose:  no skin breakdown or pressure necrosis from cpap mask Extremities:  no edema or cyanosis noted. Neurologic:  alert, and oriented, moves all 4. does not appear sleepy.   Impression & Recommendations:  Problem # 1:  OBSTRUCTIVE SLEEP APNEA (ICD-327.23)  the pt overall is doing well with cpap.  He feels it has really helped his sleep and daytime alertness.  He is having some mask leaks that I suspect is due to old seals, although he may have to consider a different style of mask.  He will contact dme to correct this.  He has lost weight since his last visit, and I have encouraged him to continue.  Problem # 2:  RESTLESS LEGS SYNDROME (ICD-333.94)  he is doing well with requip.  will continue on this.  Other Orders: Est. Patient Level III  [id number])  Patient Instructions: 1)  try getting new seal for your mask.  If this does not take care of leak, may have to consider a different type of mask 2)  turn heat up on humidifier to try and get more moisture. 3)  continue to work on weight loss.  You are doing well. 4)  followup with me in one year, but call if having issues.

## 2010-08-05 ENCOUNTER — Telehealth: Payer: Self-pay | Admitting: Cardiovascular Disease

## 2010-08-05 NOTE — Telephone Encounter (Signed)
LOV faxed to [facility] @ [place] @ [phone]

## 2010-08-07 ENCOUNTER — Ambulatory Visit (HOSPITAL_BASED_OUTPATIENT_CLINIC_OR_DEPARTMENT_OTHER)
Admission: RE | Admit: 2010-08-07 | Discharge: 2010-08-07 | Disposition: A | Discharge status: Home / Self Care | Payer: Medicare Other | Source: Ambulatory Visit | Attending: Urology | Admitting: Urology

## 2010-08-07 ENCOUNTER — Other Ambulatory Visit: Payer: Self-pay | Admitting: Urology

## 2010-08-07 DIAGNOSIS — Z8559 Personal history of malignant neoplasm of other urinary tract organ: Secondary | ICD-10-CM | POA: Insufficient documentation

## 2010-08-07 DIAGNOSIS — C679 Malignant neoplasm of bladder, unspecified: Secondary | ICD-10-CM | POA: Insufficient documentation

## 2010-08-07 HISTORY — PX: OTHER SURGICAL HISTORY: SHX169

## 2010-08-07 LAB — POCT I-STAT 4, (NA,K, GLUC, HGB,HCT)
Glucose, Bld: 99 mg/dL (ref 70–99)
HCT: 42 % (ref 39.0–52.0)
Hemoglobin: 14.3 g/dL (ref 13.0–17.0)
Potassium: 3.9 mEq/L (ref 3.5–5.1)
Sodium: 141 mEq/L (ref 135–145)

## 2010-08-07 LAB — GLUCOSE, CAPILLARY: Glucose-Capillary: 94 mg/dL (ref 70–99)

## 2010-08-12 NOTE — Op Note (Signed)
NAMEMarland Kitchen  DURON, HUCKLEBY NO.:  [id number]  MEDICAL RECORD NO.:  [id number]          PATIENT TYPE:  LOCATION:                                 FACILITY:  PHYSICIAN:  Bernestine Amass, M.D.  DATE OF BIRTH:  1955-12-26  DATE OF PROCEDURE:  08/07/2010 DATE OF DISCHARGE:                              OPERATIVE REPORT   PREOPERATIVE DIAGNOSES: 1. Transitional cell carcinoma of the bladder. 2. 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  POSTOPERATIVE DIAGNOSES: 1. Transitional cell carcinoma of the bladder. 2. 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  PROCEDURE PERFORMED:  Cystoscopy, cold cup resection of bladder tumor, fulguration with Bugbee electrode, cystogram, left retrograde pyelogram with fluoroscopic interpretation, right rigid ureteroscopy.  SURGEON:  Bernestine Amass, M.D.  ANESTHESIA:  General.  INDICATIONS:  [name] is 55 years of age.  The patient has a history of transitional cell carcinoma of the bladder and also underwent distal right ureterectomy with right distal ureteral reimplantation in 2010.  The patient had some evidence of potential papillary tumor recurrence on recent assessment.  The patient did have a CT scan of his abdomen and pelvis in the fall of 2011, which showed no evidence of obstruction, obvious ureteral or bladder tumors at that time.  The patient now presents for an entire reevaluation of his urothelium as well as cold cup resection of what appears to be papillary recurrent tumor.  TECHNIQUE AND FINDINGS:  The patient was brought to the operating room where he had successful induction of general anesthesia.  He was placed in the lithotomy position and prepped and draped in the usual manner. Appropriate surgical time-out was performed.  The patient  received perioperative ciprofloxacin.  He also had placement of PAS compression boots.  Initial cystoscopy revealed unremarkable anterior urethra and prostatic urethra.  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  We utilized a cold cup resection forceps to resect most of the visible tumor, which was sent for pathologic analysis.  The surrounding mucosa and biopsy sites were fulgurated with a Bugbee electrode.  A cystogram was done with a red Robinson catheter with fluoroscopic interpretation.  The bladder was filled with approximately 500 cc of dilute contrast.  Bladder contour was normal.  One could see some reflux up the right ureter without obvious filling defects or obstruction.  The bladder was then drained.  A cystoscope was then reinserted.  A left retrograde pyelogram was done with a cone-tip catheter.  The left ureter appeared delicate without obvious obstruction or filling defects.  Fluoroscopic interpretation was performed.  With the cystoscope in position, a guidewire was placed up to the right renal pelvis through the neo-orifice.  Rigid ureteroscopy was then performed to the ureteropelvic junction with no sign of recurrent tumor. The bladder was drained.  Lidocaine jelly was instilled and the patient was brought to the recovery room in stable condition.  Bernestine Amass, M.D.     DSG/MEDQ  D:  08/07/2010  T:  08/07/2010  Job:  [id number]  Electronically Signed by Rana Snare M.D. on 08/12/2010 12:06:51 PM

## 2010-08-15 LAB — GLUCOSE, CAPILLARY: Glucose-Capillary: 135 mg/dL — ABNORMAL HIGH (ref 70–99)

## 2010-08-15 LAB — BASIC METABOLIC PANEL
BUN: 13 mg/dL (ref 6–23)
CO2: 26 mEq/L (ref 19–32)
Calcium: 9 mg/dL (ref 8.4–10.5)
Chloride: 102 mEq/L (ref 96–112)
Creatinine, Ser: 0.9 mg/dL (ref 0.4–1.5)
GFR calc Af Amer: >60 mL/min (ref >60)
GFR calc non Af Amer: >60 mL/min (ref >60)
Glucose, Bld: 93 mg/dL (ref 70–99)
Potassium: 3.9 mEq/L (ref 3.5–5.1)
Sodium: 134 mEq/L — ABNORMAL LOW (ref 135–145)

## 2010-08-15 LAB — HEMOGLOBIN: Hemoglobin: 14.7 g/dL (ref 13.0–17.0)

## 2010-08-19 LAB — URINALYSIS, ROUTINE W REFLEX MICROSCOPIC
Bilirubin Urine: NEGATIVE
Glucose, UA: NEGATIVE mg/dL
Glucose, UA: NEGATIVE mg/dL
Ketones, ur: NEGATIVE mg/dL
Nitrite: NEGATIVE
Protein, ur: 100 mg/dL — AB
Protein, ur: 30 mg/dL — AB
Specific Gravity, Urine: 1.006 (ref 1.005–1.030)
Specific Gravity, Urine: 1.012 (ref 1.005–1.030)
Urobilinogen, UA: 0.2 mg/dL (ref 0.0–1.0)
Urobilinogen, UA: 0.2 mg/dL (ref 0.0–1.0)
pH: 5.5 (ref 5.0–8.0)

## 2010-08-19 LAB — CBC
HCT: 41.2 % (ref 39.0–52.0)
HCT: 41.2 % (ref 39.0–52.0)
Hemoglobin: 14.1 g/dL (ref 13.0–17.0)
Hemoglobin: 13.8 g/dL (ref 13.0–17.0)
MCHC: 33.9 g/dL (ref 30.0–36.0)
MCHC: 34.4 g/dL (ref 30.0–36.0)
MCV: 96.7 fL (ref 78.0–100.0)
Platelets: 177 10*3/uL (ref 150–400)
RBC: 3.34 MIL/uL — ABNORMAL LOW (ref 4.22–5.81)
RBC: 4.26 MIL/uL (ref 4.22–5.81)
RBC: 4.23 MIL/uL (ref 4.22–5.81)
RDW: 12.4 % (ref 11.5–15.5)
WBC: 5 10*3/uL (ref 4.0–10.5)
WBC: 5.4 10*3/uL (ref 4.0–10.5)
WBC: 7.7 10*3/uL (ref 4.0–10.5)

## 2010-08-19 LAB — BASIC METABOLIC PANEL
BUN: 10 mg/dL (ref 6–23)
BUN: 13 mg/dL (ref 6–23)
BUN: 16 mg/dL (ref 6–23)
CO2: 22 mEq/L (ref 19–32)
CO2: 26 mEq/L (ref 19–32)
CO2: 29 mEq/L (ref 19–32)
Calcium: 8.4 mg/dL (ref 8.4–10.5)
Calcium: 8.4 mg/dL (ref 8.4–10.5)
Calcium: 8.5 mg/dL (ref 8.4–10.5)
Calcium: 8.9 mg/dL (ref 8.4–10.5)
Calcium: 9.2 mg/dL (ref 8.4–10.5)
Chloride: 103 mEq/L (ref 96–112)
Chloride: 104 mEq/L (ref 96–112)
Chloride: 105 mEq/L (ref 96–112)
Creatinine, Ser: 0.9 mg/dL (ref 0.4–1.5)
Creatinine, Ser: 0.95 mg/dL (ref 0.4–1.5)
Creatinine, Ser: 1.03 mg/dL (ref 0.4–1.5)
GFR calc Af Amer: 58 mL/min — ABNORMAL LOW (ref >60)
GFR calc Af Amer: >60 mL/min (ref >60)
GFR calc Af Amer: >60 mL/min (ref >60)
GFR calc Af Amer: >60 mL/min (ref >60)
GFR calc Af Amer: >60 mL/min (ref >60)
GFR calc non Af Amer: 48 mL/min — ABNORMAL LOW (ref >60)
GFR calc non Af Amer: >60 mL/min (ref >60)
GFR calc non Af Amer: >60 mL/min (ref >60)
GFR calc non Af Amer: >60 mL/min (ref >60)
Glucose, Bld: 119 mg/dL — ABNORMAL HIGH (ref 70–99)
Glucose, Bld: 138 mg/dL — ABNORMAL HIGH (ref 70–99)
Glucose, Bld: 198 mg/dL — ABNORMAL HIGH (ref 70–99)
Potassium: 3.6 mEq/L (ref 3.5–5.1)
Potassium: 4.1 mEq/L (ref 3.5–5.1)
Potassium: 4.2 mEq/L (ref 3.5–5.1)
Potassium: 5.1 mEq/L (ref 3.5–5.1)
Potassium: 4 mEq/L (ref 3.5–5.1)
Sodium: 133 mEq/L — ABNORMAL LOW (ref 135–145)
Sodium: 136 mEq/L (ref 135–145)
Sodium: 136 mEq/L (ref 135–145)
Sodium: 135 mEq/L (ref 135–145)

## 2010-08-19 LAB — GLUCOSE, CAPILLARY
Glucose-Capillary: 110 mg/dL — ABNORMAL HIGH (ref 70–99)
Glucose-Capillary: 110 mg/dL — ABNORMAL HIGH (ref 70–99)
Glucose-Capillary: 111 mg/dL — ABNORMAL HIGH (ref 70–99)
Glucose-Capillary: 114 mg/dL — ABNORMAL HIGH (ref 70–99)
Glucose-Capillary: 117 mg/dL — ABNORMAL HIGH (ref 70–99)
Glucose-Capillary: 119 mg/dL — ABNORMAL HIGH (ref 70–99)
Glucose-Capillary: 119 mg/dL — ABNORMAL HIGH (ref 70–99)
Glucose-Capillary: 121 mg/dL — ABNORMAL HIGH (ref 70–99)
Glucose-Capillary: 122 mg/dL — ABNORMAL HIGH (ref 70–99)
Glucose-Capillary: 123 mg/dL — ABNORMAL HIGH (ref 70–99)
Glucose-Capillary: 123 mg/dL — ABNORMAL HIGH (ref 70–99)
Glucose-Capillary: 124 mg/dL — ABNORMAL HIGH (ref 70–99)
Glucose-Capillary: 126 mg/dL — ABNORMAL HIGH (ref 70–99)
Glucose-Capillary: 128 mg/dL — ABNORMAL HIGH (ref 70–99)
Glucose-Capillary: 128 mg/dL — ABNORMAL HIGH (ref 70–99)
Glucose-Capillary: 134 mg/dL — ABNORMAL HIGH (ref 70–99)
Glucose-Capillary: 134 mg/dL — ABNORMAL HIGH (ref 70–99)
Glucose-Capillary: 155 mg/dL — ABNORMAL HIGH (ref 70–99)
Glucose-Capillary: 170 mg/dL — ABNORMAL HIGH (ref 70–99)
Glucose-Capillary: 86 mg/dL (ref 70–99)
Glucose-Capillary: 88 mg/dL (ref 70–99)
Glucose-Capillary: 90 mg/dL (ref 70–99)
Glucose-Capillary: 96 mg/dL (ref 70–99)
Glucose-Capillary: 97 mg/dL (ref 70–99)
Glucose-Capillary: 98 mg/dL (ref 70–99)
Glucose-Capillary: 98 mg/dL (ref 70–99)
Glucose-Capillary: 94 mg/dL (ref 70–99)

## 2010-08-19 LAB — HEMOGLOBIN AND HEMATOCRIT, BLOOD
HCT: 37.7 % — ABNORMAL LOW (ref 39.0–52.0)
HCT: 38.1 % — ABNORMAL LOW (ref 39.0–52.0)
Hemoglobin: 12.8 g/dL — ABNORMAL LOW (ref 13.0–17.0)
Hemoglobin: 13 g/dL (ref 13.0–17.0)

## 2010-08-19 LAB — DIFFERENTIAL
Basophils Absolute: 0 10*3/uL (ref 0.0–0.1)
Basophils Relative: 0 % (ref 0–1)
Eosinophils Relative: 1 % (ref 0–5)
Lymphocytes Relative: 21 % (ref 12–46)
Lymphs Abs: 1.6 10*3/uL (ref 0.7–4.0)
Monocytes Absolute: 1 10*3/uL (ref 0.1–1.0)
Monocytes Relative: 11 % (ref 3–12)
Monocytes Relative: 14 % — ABNORMAL HIGH (ref 3–12)
Neutro Abs: 2.8 10*3/uL (ref 1.7–7.7)
Neutro Abs: 4.9 10*3/uL (ref 1.7–7.7)
Neutrophils Relative %: 57 % (ref 43–77)

## 2010-08-19 LAB — POCT I-STAT 4, (NA,K, GLUC, HGB,HCT)
HCT: 45 % (ref 39.0–52.0)
Hemoglobin: 15.3 g/dL (ref 13.0–17.0)

## 2010-08-19 LAB — ABO/RH: ABO/RH(D): A POS

## 2010-08-19 LAB — URINE MICROSCOPIC-ADD ON

## 2010-08-19 LAB — COMPREHENSIVE METABOLIC PANEL
ALT: 27 U/L (ref 0–53)
AST: 29 U/L (ref 0–37)
Albumin: 3 g/dL — ABNORMAL LOW (ref 3.5–5.2)
Alkaline Phosphatase: 33 U/L — ABNORMAL LOW (ref 39–117)
GFR calc Af Amer: >60 mL/min (ref >60)
Glucose, Bld: 87 mg/dL (ref 70–99)
Potassium: 3.8 mEq/L (ref 3.5–5.1)
Sodium: 141 mEq/L (ref 135–145)
Total Protein: 5.3 g/dL — ABNORMAL LOW (ref 6.0–8.3)

## 2010-08-19 LAB — TYPE AND SCREEN
ABO/RH(D): A POS
Antibody Screen: NEGATIVE

## 2010-08-19 LAB — URINE CULTURE

## 2010-08-20 LAB — GLUCOSE, CAPILLARY: Glucose-Capillary: 131 mg/dL — ABNORMAL HIGH (ref 70–99)

## 2010-08-20 LAB — POCT I-STAT 4, (NA,K, GLUC, HGB,HCT)
Glucose, Bld: 120 mg/dL — ABNORMAL HIGH (ref 70–99)
HCT: 45 % (ref 39.0–52.0)
Hemoglobin: 15.3 g/dL (ref 13.0–17.0)

## 2010-08-30 ENCOUNTER — Other Ambulatory Visit: Payer: Self-pay | Admitting: Internal Medicine

## 2010-09-24 NOTE — Op Note (Signed)
Isaac [name], Isaac [name]             ACCOUNT NO.:  [id number]   MEDICAL RECORD NO.:  [id number]          PATIENT TYPE:  AMB   LOCATION:  [facility]                         [facility]ACILITY[facility]   PHYSICIAN:  Lucina Mellow. [name], M.D.DATE OF BIRTH:  [birth year]   DATE OF PROCEDURE:  01/13/2007  DATE OF DISCHARGE:                               OPERATIVE REPORT   PREOPERATIVE DIAGNOSIS:  History of bladder carcinoma.   POSTOPERATIVE DIAGNOSIS:  History of bladder carcinoma, recurrent  bladder tumor and urethral meatal stenosis.   PROCEDURE:  Cystoscopy, urethral meatal dilation, transurethral  resection of bladder tumor and cold cup bladder biopsies.   SURGEON:  Dr. Hessie Diener.   ANESTHESIA:  General.   INDICATIONS:  This 55 year old gentleman has history of bladder  carcinoma going back to January 2005 and then was treated with BCG.  He  had a recurrent bladder tumor earlier this year and underwent BCG  therapy again along with Intron-A.  He is brought to the OR today for  further evaluation and treatment.   PROCEDURE:  The patient brought to the operating room, placed lithotomy  position and external genitalia were prepped and draped in usual  fashion.  He was cystoscoped but only after dilating his urethral  meatus.  Anterior urethra was otherwise normal, prostatic urethra was  unremarkable, bladder had low capacity and a small papillary tumor on  the floor.  Rest of bladder looked fine except for minimal area of  erythema on the dome.  The small bladder tumor on the floor was resected  and sent to pathology along with biopsies from the dome and left wall  and right wall.  All biopsy sites were fulgurated with the Bugbee  electrode and at this time, the bladder was emptied, scope removed.  He  had good hemostasis and an essentially no blood loss.      Lucina Mellow. [name], M.D.  Electronically Signed     LJP/MEDQ  D:  01/13/2007  T:  01/13/2007  Job:  [id number]

## 2010-09-24 NOTE — Assessment & Plan Note (Signed)
[facility] OFFICE NOTE   NAME:O'REILLYLamin, Mcqueary                     MRN:          [id number]  DATE:07/09/2007                            DOB:          [birth year]    IDENTIFICATION:  [name] is a 55 year old gentleman with a history  of CAD (felt to have microvascular disease with a decreased flow).  I  last saw him in early February.   When I saw him his symptoms were worsening and I went ahead and set him  up for another cardiac catheterization to be done.  This was done by Dr.  Pierre Bali on February 5.  The LAD was a moderate-sized vessel, gave  off a moderate to large diagonal in the mid section, several smaller  diagonals.  There was a 30% proximal lesion.  The mid to distal LAD  appeared to have mild to moderate diffuse disease but also appeared to  have a component of vasospasm.  Distally it had TIMI 2 flow.  The left  circumflex gave off a large OM and three PLSAs, normal with borderline  TIMI 2 flow.  The RCA was moderate, codominant vessel.  Flow was just  minimally diminished.  LVF was 60%.   With these findings it was recommended that he probably had  microvascular disease and a possible component of vasospasm, plan to  maximize medical therapy and, again, refrain from smoking.   The patient continues to have episodes of chest pain especially if he  gets stressed.   CURRENT MEDICATIONS:  1. Wellbutrin XL 300.  2. Effexor 150.  3. Clonazepam 0.5 b.i.d.  4. Aspirin 325.  5. Protonix 40.  6. Norvasc 5 b.i.d.  7. Januvia 100.  8. Actos 45.  9. Toprol-XL 25.  10.ReQuip 1.  11.Multivitamin.  12.CPAP.  13.Crestor 20.  14.Imdur 120.   PHYSICAL EXAM:  The patient is in no distress at rest.  Blood pressure  is 132/81, pulse 74, weight 209, up 2 pounds from previous.  LUNGS:  Clear.  NECK:  JVP is normal.  CARDIAC EXAM:  Regular rate and rhythm.  S1, S2.  No S3, no murmurs.  ABDOMEN:   Benign.  EXTREMITIES:  No edema.   IMPRESSION:  1. Microvascular disease with chest pain.  I am going to try to      maximize his nitrates, increase Imdur to 180, also to increase his      Toprol-XL to 37.5.  I question if Ranexa would be useful in this      gentleman.  I think stress management is critical for him.  I think      if he gets adrenaline rises he will only have further vasospasm and      I think stress management and avoiding stressful situations is      imperative.  2. Dyslipidemia.  Continue on Crestor 20.  Will need to have follow-up      lipids at some point.  Note, his HDL is 22.  He failed Niaspan.   I would like to see the patient back in a  few months; again, thoughts  towards other medical therapy.     [name], MD, [facility]  Electronically Signed    PVR/MedQ  DD: 07/09/2007  DT: 07/11/2007  Job #: [id number]   cc:   [name]

## 2010-09-24 NOTE — Op Note (Signed)
Isaac [name], Isaac [name]             ACCOUNT NO.:  [id number]   MEDICAL RECORD NO.:  [id number]          PATIENT TYPE:  AMB   LOCATION:  [facility]                         [facility]ACILITY:  [facility]   PHYSICIAN:  Lucina Mellow. [name], M.D.DATE OF BIRTH:  1955-11-10   DATE OF PROCEDURE:  10/07/2006  DATE OF DISCHARGE:                               OPERATIVE REPORT   PREOPERATIVE DIAGNOSIS:  Rule out recurrent bladder carcinoma.   POSTOPERATIVE DIAGNOSES:  1. Rule out recurrent bladder carcinoma.  2. Urethral meatal stenosis.   PROCEDURE:  Cystoscopy, urethral meatal dilation, bilateral retrograde  pyelograms with interpretation, resection of small bladder tumor and  random bladder biopsies.   SURGEON:  Dr. Hessie Diener.   ANESTHESIA:  General.   INDICATIONS:  This 55 year old gentleman has had history of bladder  carcinoma going back to January 2005 and he was treated with BCG after  that.  He had recurrent tumor discovered earlier this year in January  and underwent BCG therapy again.  He is brought to the OR today for  further evaluation and follow-up.   PROCEDURE:  The patient is brought to the operating room and placed in  the lithotomy position.  External genitalia were prepped and draped in  the usual fashion.  He was cystoscoped after dilating the urethral  meatus.  Anterior urethra was otherwise normal.  Prostatic urethra was  unremarkable.  The bladder had a small papillary tumor on the dome but  otherwise was unremarkable.   Bilateral retrograde pyelograms were obtained by injecting contrast by  means of a cone-tip catheter through the cystoscope and upper tracts  were delicate nonobstructed with no filling defects.   Cold cup bladder biopsy forceps was used to resect this small papillary  tumor on the bladder dome and then random biopsies were taken on the  bladder base in two areas.  All biopsy sites were fulgurated with the  Bugbee electrode.  At this point the bladder was  emptied, the scope  removed and the patient sent to the recovery room in good condition  having tolerated the procedure well.      Lucina Mellow. [name], M.D.  Electronically Signed     LJP/MEDQ  D:  10/07/2006  T:  10/07/2006  Job:  [id number]

## 2010-09-24 NOTE — Op Note (Signed)
NAMEMarland Kitchen  Isaac [name], Isaac [name] NO.:  [id number]   MEDICAL RECORD NO.:  [id number]          PATIENT TYPE:  INP   LOCATION:  1423                         FACILITY:  [facility]   PHYSICIAN:  Bernestine Amass, M.D.  DATE OF BIRTH:  April 18, 1956   DATE OF PROCEDURE:  10/30/2008  DATE OF DISCHARGE:                               OPERATIVE REPORT   PREOPERATIVE DIAGNOSIS:  Transitional cell carcinoma of the right distal  ureter.   POSTOPERATIVE DIAGNOSIS:  Transitional cell carcinoma of the right  distal ureter.   PROCEDURE PERFORMED:  Pelvic exploration with right distal ureterectomy  and psoas hitch, ureteral reimplantation.   SURGEON:  Bernestine Amass, M.D.   ASSISTANT:  Thana Farr. Karsten Ro, M.D.   ANESTHESIA:  General.   INDICATIONS:  [name] is a 55 year old male.  He has had recurrent  papillary transitional cell carcinoma of the urinary bladder and has  been under the care of Dr. Hessie Diener.  The patient initially was  thought to have some tumor in his right distal ureter on evaluation by  Dr. [name].  We were asked to evaluate him for the possibility of  nephroureterectomy.  We reassessed the situation.  We found that the  distal third of his ureter on the right side was carpeted by papillary  tumor.  Cytology and biopsy showed this to be low grade with a couple of  small foci where there appeared to be higher grade tumor but no evidence  of stromal invasion.  The patient has longstanding diabetes mellitus.  We felt that if we could preserve the right renal unit that would be to  his advantage.  He appeared to have normal bladder capacity.  We thought  he might be better served with psoas edge reimplant or Boari flap  reconstruction.  These issues were extensively discussed with him and  his family and full informed consent has been obtained.  The patient  does have an indwelling right ureteral stent at this time.  He has  received perioperative antibiotics and  presents now for the procedure.  He has also had placement of compression boots for DVT prophylaxis.   TECHNIQUE AND FINDINGS:  The patient was brought to the operating room.  He had successful induction of general endotracheal anesthesia.  Appropriate surgical time-out was performed.  He was placed in straight  supine position with careful padding of the extremities.  He was then  prepped and draped in the usual manner.  A standard lower midline  incision was made from the pubic symphysis to just underneath the  umbilicus.  The patient had had a previous umbilical hernia repair with  mesh.  The fascia was incised and the retropubic space was initially  dissected.  Once retropubic space was fully developed, retractors were  placed.  The peritoneal cavity was then entered.  There was no evidence  of any adhesions, status post his umbilical hernia repair.  The bowel  was carefully packed in the upper abdomen.  The retroperitoneum on the  right side was exposed and the ureter was dissected, isolated and  looped.  The ureter  was dissected proximally and distally.  Distally we  freed the ureter to the bladder hiatus.  Based on previous measurements  and x-ray studies, we chose an area approximately 9 cm above the  ureterovesical junction.  Ureter was clipped, transected and a section  was sent for frozen section which revealed no evidence of malignancy  with negative margins.  Once proximal ureter was clipped attention was  turned towards the bladder.  A Foley catheter had been placed sterilely  on the field.  The bladder was filled to help identify and stay sutures  were placed.  A bladder incision was then performed keeping in mind the  possible need for a Boari flap.  The stent that had been placed in the  ureter had been removed from the proximal ureter and clipped into the  distal segment for security.  Once the bladder was opened, both ureteral  orifices were identified.  In the right  side, the stent was seen and a  stay suture was placed in the mucosa and then anchored to the stent.  A  bladder cuff of tissue was taken around the right distal ureter.  The  distal ureter was then taken out of the intramural aspect utilizing a  combination of sharp and blunt dissecting technique as well as some  electrocautery.  Once this was freed the entire distal ureteral segment  was removed and sent for permanent pathologic analysis.   The bladder was then closed at the site of the right ureteral orifice  utilizing a double layer closure with 2-0 and 3-0 Vicryl suture.  At  that juncture, we went up and freed up the psoas muscle.  Important  nerve structures were identified.  We placed three 2-0 PDS sutures in  the superficial fascia of the psoas muscle again making sure that there  was no nerves incorporated in those sutures.  With the bladder opened  and a finger in the dome of the bladder, the right side of the bladder  was then hitched to the psoas muscle.  An neohiatus was then created and  the proximal ureter was brought through the neohiatus utilizing a stay  suture.  There appeared to be no tension on the ureter.  The ureter was  spatulated and anastomosed to the mucosa of the bladder.  We elected to  do this in a refluxing manner.  Because of the possible need for BCG and  a low likelihood in a male of developing pyelonephritis.  The ureter was  also anchored to the outside of the detrusor muscle.  The 24 cm 7-French  stent was also placed in the ureter.  The cystotomy incision was then  closed with multiple layers.  A pelvic drain was placed.  We felt that  given the low grade papillary tumor, that lymph node dissection was not  necessary.  The pelvis was copiously irrigated.  Midline fascia was  closed with running #1 PDS suture.  The wound was infiltrated with  Marcaine and the skin was closed with clips.  All sponge and needle  counts were correct.  The patient was  taken to recovery room in stable  condition.  Estimated blood loss of 150 mL.      Bernestine Amass, M.D.  Electronically Signed     DSG/MEDQ  D:  10/31/2008  T:  11/01/2008  Job:  [id number]

## 2010-09-24 NOTE — Op Note (Signed)
NAMEFRANKY, Isaac [name]             ACCOUNT NO.:  [id number]   MEDICAL RECORD NO.:  [id number]          PATIENT TYPE:  AMB   LOCATION:  [facility]                         [facility]ACILITY:  [facility]   PHYSICIAN:  Lucina Mellow. [name], M.D.DATE OF BIRTH:  1955/08/18   DATE OF PROCEDURE:  04/19/2007  DATE OF DISCHARGE:                               OPERATIVE REPORT   PREOPERATIVE DIAGNOSIS:  Rule out recurrent bladder carcinoma.   POSTOPERATIVE DIAGNOSIS:  Rule out recurrent bladder carcinoma.   PROCEDURE:  Cystoscopy and cold cup bladder biopsy.   ANESTHESIA:  General.   SURGEON:  Lucina Mellow. [name], M.D.   ANESTHESIA:  General.   INDICATIONS:  This gentleman has had bladder carcinoma going back to  January 2005 and has had several recurrences, including this year.  He  has been on BCG which he does not tolerate well.  His tumor is  fortunately been usually low grade and superficial.  He is brought to  the OR today for surveillance cystoscopy and follow-up cold cup bladder  biopsy.   PROCEDURE:  The patient was brought to the operating room, placed in the  lithotomy position.  External genitalia were prepped and draped in usual  fashion.  He was cystoscoped and the anterior urethra was normal.  The  prostatic urethra was unremarkable.  The bladder fortunately had no  lesions whatsoever and had no inflammatory lesions, erythema or  papillary tumors.  Random biopsies were taken with the cold cup bladder  biopsy forceps, and I took 2 biopsies from the base; 1 from the right  wall and 1 from the left wall.  The biopsy sites were fulgurated with  the Bugbee electrode.  The bladder was emptied and the scope removed.  He had essentially no blood loss.  He was taken to the recovery room in  good condition, having tolerated the procedure well.      Lucina Mellow. [name], M.D.  Electronically Signed     LJP/MEDQ  D:  04/19/2007  T:  04/19/2007  Job:  [id number]

## 2010-09-24 NOTE — Assessment & Plan Note (Signed)
[facility] OFFICE NOTE   NAME:OREILLYHarles, Kauk                    MRN:          [id number]  DATE:10/08/2006                            DOB:          11-16-1955    IDENTIFICATION:  [name] is a 55 year old gentleman with a history  of CAD and status post MI in 2005 with an occluded ramus branch.  Also a  slow flow, as noted on cath, down his LAD.  Probable microvascular  disease versus spasm.  Patient also is being followed closely now with  his bladder cancer.  Is followed by Dr. [name].   Since seen, he is going through continued evaluation with Dr. [name]  with another biopsy.  He is also being treated for skin cancer on his  arms, finished topical therapy.   On talking to him, he still has episodes of chest pressure again,  brought on not with a lot of exertion, also with mental stress.  Some  decreased flushing with Norvasc.  A drop in Norvasc dose also.   He has cut his Niaspan in half.  I have told him to stop it totally, as  this will also take care of flushing.   CURRENT MEDICATIONS:  1. Wellbutrin 300.  2. Effexor 150.  3. Aspirin 325.  4. Protonix 40.  5. Actos 45.  6. Toprol XL 25.  7. Requip daily.  8. Jenuvia 1 gm daily.  9. Imdur 90 daily.  10.CPAP at night.  11.Crestor 10.  12.Multivitamin.  13.Klonopin 0.5 b.i.d.  14.Norvasc 5 b.i.d.  15.Niaspan 500 (1 gm, half).   PHYSICAL EXAMINATION:  GENERAL:  Patient is in no distress.  VITAL SIGNS:  Blood pressure is 136/80, pulse 75.  Weight 210, up from  206 last visit.  LUNGS:  Clear.  CARDIAC:  Regular rate and rhythm, S1 and S2.  No S3.  No significant  murmurs.  ABDOMEN:  Benign.  EXTREMITIES:  Skin lesions, upper extremities, were chemo-used.   IMPRESSION:  1. Coronary artery disease:  Again, patient was with an occluded ramus      in the past.  Microvascular disease versus spasm, again with low      flow down his left  anterior descending artery.  Still having      symptoms.  Again, stressed can exacerbate.  I would continue on      medical therapy for now.  I would not change things.  I again would      take activities as tolerated.  I do not feel he is a candidate for      getting back to work.  This has been too stressful.  2. Bladder cancer:  Again, followed by Dr. [name].  3. Dyslipidemia:  Stop Niaspan.  Continue Crestor.  4. Hypertension:  Fair control.   I will see the patient later in the summer.     [name], MD, [facility]  Electronically Signed    PVR/MedQ  DD: 10/15/2006  DT: 10/16/2006  Job #: [id number]

## 2010-09-24 NOTE — Op Note (Signed)
NAMEMarland Kitchen  Isaac [name], Isaac [name]            ACCOUNT NO.:  [id number]   MEDICAL RECORD NO.:  [id number]          PATIENT TYPE:  OIB   LOCATION:  2550                         FACILITY:  [facility]   PHYSICIAN:  Onnie Graham, MD     DATE OF BIRTH:  01-04-1956   DATE OF PROCEDURE:  05/17/2007  DATE OF DISCHARGE:                               OPERATIVE REPORT   PREOPERATIVE DIAGNOSES:  1. Internal nasal valve collapse.  2. Inferior turbinate hypertrophy.  3. Nasal septal perforation.  4. Obstructive sleep apnea.   POSTOP DIAGNOSES:  1. Internal nasal valve collapse.  2. Inferior turbinate hypertrophy.  3. Nasal septal perforation.  4. Obstructive sleep apnea.   PROCEDURE:  1. Open rhinoplasty with placement of modified cartilage graft.  2. Harvest of left ear conchal cartilage grafts.  3. Mucosal flap rotation closure of septal perforation.  4. Bilateral inferior turbinate reduction.   SURGEON:  Dr. Melida Quitter.   ANESTHESIA:  General endotracheal anesthesia.   COMPLICATIONS:  None.   INDICATIONS:  The patient is a 55 year old white male who has had nasal  trauma and nasal surgery in the past who complains of a fairly long  history of restricted nasal breathing.  His nose whistles as well.  Breathing is easier if he pulls the nostrils open.  He tried Breathe-  Right strips with some improvement and tried nasal cones but could not  tolerate them.  He uses CPAP at night because of sleep apnea and  presents for surgical management of his nasal airway as well as closure  of the septal perforation.   FINDINGS:  There was a less than 1 cm perforation of the anterior nasal  septum.  Internal nasal valves were narrow.   DESCRIPTION OF PROCEDURE:  The patient was identified in the holding  room and informed consent having been obtained including discussion of  risks, benefits, alternatives, the patient was brought to the operating  suite and put on the operating room table in the supine  position.  Anesthesia was induced and the patient was intubated by the anesthesia  team without difficulty.  The patient was given  intravenous antibiotics  during the case.  The eyes were taped closed and the face and left ear  were prepped and draped in a sterile fashion.  The left conchal area was  injected 1% lidocaine with 1:100,000 epinephrine.   A skin incision was then made with a 15 blade scalpel around the margin  of the conchal region posteriorly.  A subperichondrial flap was then  elevated over the conchal cartilage using curved scissors.  The incision  was then extended through the cartilage posteriorly around the margin  and the soft tissues were then elevated off the posterior surface of the  cartilage leaving the perichondrium on the cartilage.  The incision was  then extended around the anterior extent of the conchal cartilage and  the cartilage was then removed.  Bleeding was controlled with Bovie  electrocautery.   The surgical site was copiously irrigated with saline.  The skin was  closed with 5-0 nylon in a simple running fashion.  Xeroform was then  placed within the conchal bowl as well as behind the ear and a 2-0 nylon  was then used to do a single through-and-through mattress suture to  create a bolster dressing.  The cartilage had been placed in saline.  During the ear cartilage graft harvest, Afrin soaked pledgets were  placed in the nose and they were removed at this point.   The septum was then injected with 1% lidocaine with 1:100,000  epinephrine on both sides.  The perforation was then rimmed with a 15  blade scalpel removing the edge.  The Killian incision was made on the  right side using a 15 blade scalpel and extending into a  subperichondrial plane along the right side of the septum.  Dissection  was difficult due to previous scarring.  This extended into the into the  perforation.  Dissection then continued inferiorly along the septal bone  and  then onto this the floor of the right nasal passage.  It extended  then laterally toward the inferior turbinate takeoff.   An incision was then made underneath the inferior turbinate freeing the  mucosa to be removed in a medial fashion.  On the opposite side, a long  arcing skin incision was made along the along the dorsal edge of the  septum incurring caudally and inferiorly to the anterior to the  perforation.  The flap was then elevated in the subperichondrial plane  and was allowed to be rotated into the perforation.  An attempt was then  made to close the right side perforation with the nasal floor mucosal  flap but it did not move well.  A couple of 4-0 chromic sutures were  placed.  The left side septal flap was then laid down across the  perforation and covered it entirely.  It was then secured with 4-0  chromic suture around the edges.  A running 4-0 plain gut mattress  stitch was then placed around the perforation with a single running  stitch.   At this point, the nasal passages were suctioned.  The external nose was  then injected with 1% lidocaine with 1:100,000 epinephrine.  A  columellar incision was marked with a marking pen and extended into the  nasal ala in a marginal incision.  This was then made with 15 blade  scalpel.  Soft tissues were then elevated off the lower lateral  cartilages using curved scissors.  Bipolar electrocautery was used  control columellar bleeding.  Dissection extended up and around the  lateral portion of the lower lateral cartilage cartilages and onto the  upper lateral cartilages.  The ear cartilage graft was then laid into  the dissection region and was secured to the lateral extent of the  lateral crura of the lower lateral cartilages using 4-0 nylon suture.  It was then attached to the scroll region of the upper lateral cartilage  using the same suture.  The skin and subcutaneous flap was then laid  back down over the tip of the nose and  the incision was then closed with  5-0 nylon in  simple interrupted fashion.  The marginal incisions were closed with a  single 4-0 chromic suture.   At this point, the inferior turbinates were injected with 1% lidocaine  with 1:100,000 epinephrine.  The Killian incision was closed with a 4-0  chromic in simple interrupted fashion.  Stab incisions were then made at  the anterior edge of the inferior turbinates using a 15 blade scalpel  and the mucosal mucosa was elevated off the underlying bone.  The  microdebrider was then used to remove the submucosal tissue, keeping the  overlying mucosa and underlying bone intact.  This was done on both  sides.  After this was completed, the soft tissues were laid back into  place and Doyle stents were placed on both sides of the nose coated in  bacitracin ointment.  They were then secured at the anterior septum  using a single 2-0 chromic suture.   The nasal dorsal skin was then coated with benzoin and Steri-Strips.  A  thermoplast splint was then placed in hot water until it was malleable  and was then cooled slightly and laid on the dorsum until it hardened.  The throat was then suctioned and the patient was returned to anesthesia  for wake-up.  He was extubated and moved to the recovery room in stable  condition.      Onnie Graham, MD  Electronically Signed     DDB/MEDQ  D:  05/17/2007  T:  05/17/2007  Job:  [id number]   cc:   Onnie Graham, MD

## 2010-09-24 NOTE — Op Note (Signed)
NAME:  Isaac [name], Isaac [name]            ACCOUNT NO.:  [id number]   MEDICAL RECORD NO.:  [id number]          PATIENT TYPE:  AMB   LOCATION:  N[facility]C                         FACILITY:  [facility]   PHYSICIAN:  Lucina Mellow. [name], M.D.DATE OF BIRTH:  [birth year]   DATE OF PROCEDURE:  02/16/2008  DATE OF DISCHARGE:                               OPERATIVE REPORT   PREOPERATIVE DIAGNOSIS:  History of recurrent bladder carcinoma which  had been low-grade and superficial and noninvasive treated with BCG in  the past in addition to bladder biopsies and resections.  He comes in  today for surveillance cystoscopy and he is due for bilateral retrograde  pyelograms.  Preoperative diagnosis history of bladder carcinoma.   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   SURGEON:  Lucina Mellow. [name], M.D.   ANESTHESIA:  General.   PROCEDURE IN DETAIL:  The patient was brought to the operating room and  placed in lithotomy position.  External genitalia were prepped and  draped in the usual fashion and he was cystoscoped and the bladder was  totally unremarkable.  There was no evidence of papillary tumors or  inflammatory areas or increased vascularity.   Then using the cystoscope and then utilizing a cone-tip catheter a left  retrograde pyelogram was obtained which showed normal ureter, normal  pyelocalyceal system with no filling defects or abnormalities.  A right  retrograde pyelogram was then performed. At the brim of the bony pelvis.  there seemed to be slight hangup in contrast passage and almost a C-  shaped abnormality before contrast would go up higher.  This was  repeated on a couple of injections and then I inserted a  flexible core  guidewire, with the tip softened, up the right ureter without  difficulty.  I then injected contrast through an open-ended ureteral  catheter placed over this guidewire above the suspected lesion and the  right pyelocalyceal system was unremarkable and the right upper ureter  was nonobstructed.   I then kept the guidewire up and used a short ureteral access sheath to  dilate the intramural ureter.  I then used the flexible cystoscope to  evaluate the distal ureter including the ureter up to L4.  At the top of  the pelvic brim there were two tiny barely visible frond-like lesions  that to me were suspicious of TCC.  I gave thought to try and obtain a  biopsy as there was concern that there would be intraureteral bleeding  and then an inability necessarily to destroy these two lesions  completely so I opted to use the Holmium laser to treat these two  extremely tiny but suspicious areas.  He does not tolerate catheters  well so I was hoping to leave out a double-J catheter so I injected  contrast through an open-ended ureteral catheter that I had inserted  through the guidewire and the laser treated region was without problem  but there was some extravasation at the right intramural ureter.  Therefore I thought it best to leave a double-J catheter in place which  I did by reinserting a guidewire and at this time I used a Glidewire.  Over that a 6  French 26 cm double-J Contour catheter was placed with a full coil in  the renal pelvis and a full coil in the bladder.  The string was taped  to the penis.  He was taken to the recovery room in good condition  having tolerated the procedure well.      Lucina Mellow. [name], M.D.  Electronically Signed     LJP/MEDQ  D:  02/16/2008  T:  02/16/2008  Job:  [id number]

## 2010-09-24 NOTE — Assessment & Plan Note (Signed)
[place] OFFICE NOTE   NAME:O'REILLYHanan, Isaac [name]                     MRN:          [id number]  DATE:05/15/2008                            DOB:          1956-04-12    IDENTIFICATION:  [name] is a 55 year old gentleman who I follow in  clinic.  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  He  also has a history of diabetes, dyslipidemia, and bladder cancer.  He is  followed by Dr. Renold Genta and Dr. Dwyane Dee.   I saw him last in July.  In the interval, he has been doing fairly well.  He has had a few episodes of chest pain, is under decreased stress now  that he is on disability.   His current medicines include:  1. Wellbutrin XL 300.  2. Effexor XR 150.  3. Klonopin 5 b.i.d.  4. Aspirin 325.  5. Protonix 40.  6. Norvasc 5 b.i.d.  7. Actos 45.  8. Toprol-XL 25.  9. ReQuip 1 daily.  10.Multivitamin.  11.CPAP, does not tolerate well.  12.Crestor 20.  13.Imdur 120.  14.Janumet 1 g daily.  15.TriCor 145.  16.Niaspan 500.   REVIEW OF SYSTEMS:  Notes recent sore throat, coughing up a little brown  sputum.  No fevers or rigors.   PHYSICAL EXAMINATION:  GENERAL:  The patient is in no distress.  VITAL SIGNS:  Blood pressure is 114/73, pulse is 56 and regular, weight  210 pounds up 7 pounds from previous.  HEENT:  Throat is without exudate.  Tonsils are slightly prominent, but  not inflamed.  NECK:  JVP is normal.  LUNGS:  Clear.  No rales.  CARDIAC:  Regular rate and rhythm.  S1 and S2.  No S3.  No significant  murmurs.  ABDOMEN:  Benign.  EXTREMITIES:  No edema.   IMPRESSION:  1. Cardiac, seems to be doing well on current regimen.  I would      continue for now.  2. Hypertension, adequate control.  3. Dyslipidemia.  We will need to get fasting lipids on his new      regimen.  Dr. Dwyane Dee had added the Niaspan back.  He did  not      tolerate in the past.  4. Diabetes, on oral agents.  5. Bladder cancer.  Has urology followup.   I will set to see in October, sooner if problems develop.   ADDENDUM:  A 12-lead EKG, normal sinus rhythm, 64 beats per minute.  Nonspecific ST-T wave changes.     [name], MD, [facility]  Electronically Signed    PVR/MedQ  DD: 05/15/2008  DT: 05/16/2008  Job #: [id number]   cc:   Elayne Snare, M.D.  [name]. Renold Genta, M.D.

## 2010-09-24 NOTE — Assessment & Plan Note (Signed)
[facility] OFFICE NOTE   NAME:[name], [name]                     MRN:          [id number]  DATE:06/14/2007                            DOB:          1955/12/31    IDENTIFICATION:  [name] is a 55 year old gentleman whom I last saw  back in August.  He has a history of CAD (LAD with TIMI 2 flow in the  mid to distal portion; circumflex codominant; TIMI 2 flow in the PDA  from RCA).  The LVF is normal.   Since seen, the patient has complained of over the past month, chest  pain.  The pain can occur with and without activity. He actually has had  episodes that go into his neck.  He has taken nitro forehead.  He does  say he gets winded with activity.   Note, the patient had a Myoview done in August of last year that was  normal.   His current medications include Wellbutrin XL 300, Effexor XR 150,  clonazepam 5 b.i.d., aspirin 325 daily, Protonix 40, Norvasc 5 b.i.d.  Januvia 100 daily, Actos 45, Toprol XL 25, Requip 1 mg, multivitamins,  CPAP, Crestor 20 and Imdur 120 and metformin 1 gram at supper 500 mg  q.h.s.   PHYSICAL EXAM:  The patient is in no distress.  Blood pressure is 130/80 pulse was 81, weight 207.  His lungs are clear.  No wheezes or rales.  Cardiac exam regular rate and rhythm, S1-S2 no S3 no murmurs.  ABDOMEN:  Benign.  BACK:  Sweaty.  EXTREMITIES:  No edema.  2+ pulses.   12-lead EKG normal sinus rhythm 81 beats per minute sinus arrhythmia.   ALLERGIES:  None   PAST MEDICAL HISTORY:  1. Coronary artery disease.  2. Sleep apnea.  3. Bladder CA followed by Dr. [name].  4. Dyslipidemia.  Did not tolerate Niaspan.  5. Hypertension.  6. Diabetes.   SOCIAL HISTORY:  The patient does not smoke or drink.  Currently not  working.   REVIEW OF SYSTEMS:  All systems reviewed and just gives out he says.  Otherwise negative to the above problem except as noted.   IMPRESSION:  Mr.  Isaac [name] is a gentleman with CAD that shows no slow  flow in the LAD and PEA.  Question if he has might microvascular  disease.  Have been treating for this with nitrates and Norvasc.  His  symptoms have increased over the past month and it would be important to  rule out progressive disease.  It has been 2 years since his per last  catheterization.  I am, therefore, recommending that he proceed with  cardiac catheterization.  Will get pre cath testing today.  Continue on  his current medicines for now.  He does have a nitroglycerin to take if  needed.     [name], MD, [facility]  Electronically Signed    PVR/MedQ  DD: 06/14/2007  DT: 06/15/2007  Job #: [id number]

## 2010-09-24 NOTE — Op Note (Signed)
NAME:  Isaac [name], Isaac [name]            ACCOUNT NO.:  [id number]   MEDICAL RECORD NO.:  [id number]          PATIENT TYPE:  AMB   LOCATION:  NESC                         [facility]ACILITY:  [facility]   PHYSICIAN:  Lucina Mellow. [name], M.D.DATE OF BIRTH:  1955/11/30   DATE OF PROCEDURE:  08/13/2007  DATE OF DISCHARGE:                               OPERATIVE REPORT   PREOPERATIVE DIAGNOSIS:  History of bladder carcinoma.   POSTOPERATIVE DIAGNOSIS:  History of bladder carcinoma.   PROCEDURE:  Cystoscopy and cold cup bladder biopsies.   SURGEON:  Dr. Hessie Diener.   ANESTHESIA:  General.   INDICATIONS:  This gentleman has had a past history of bladder  carcinomas which have been low grade and superficial and noninvasive. He  has been treated with BCG before but does not tolerate that well.  He  has had bladder tumors in January 2005 and several recurrences including  resections in January, May and September 2008.  He presents today for  surveillance cystoscopy and of bladder biopsy.   PROCEDURE:  The patient was brought to the operating room, placed in  lithotomy position, external genitalia were prepped and draped in the  usual fashion.  He was cystoscoped and the anterior urethra was normal.  The prostate had partial outlet obstruction.  The bladder had no  papillary tumors and very minimal increased generalized erythema.  I did  random cold cup bladder biopsies on the right wall, left wall and  bladder base and hopefully these will be negative.  The bladder biopsy  sites were fulgurated with the Bugbee electrode and he was taken to the  recovery room in good condition having tolerated the procedure well.      Lucina Mellow. [name], M.D.  Electronically Signed     LJP/MEDQ  D:  08/13/2007  T:  08/13/2007  Job:  [id number]

## 2010-09-24 NOTE — Assessment & Plan Note (Signed)
[place] OFFICE NOTE   NAME:O'REILLYHorst, Stidham                     MRN:          [id number]  DATE:11/25/2007                            DOB:          20-Apr-1956    IDENTIFICATION:  [name] is a 55 year old gentleman.  I last saw  him back in February.  He has a history of mild CAD, but probable  microvascular disease with TIMI 2 flow noted particularly in the LAD and  circumflex, minimally in the RCA.   Since seen, he notes a spell a couple weeks ago, he was sitting and  watching TV when he began to develop chest pressure.  He took a  nitroglycerin, continued, he actually broke out into a sweat, ended up  taking 3, and these symptoms left.  He has not had any other spells like  this since.  He is trying to keep his stress level low, as this has  exacerbated things in the past.   CURRENT MEDICATIONS:  1. Wellbutrin XL 300.  2. Effexor XR 150.  3. Clonazepam 5 mg b.i.d.  4. Aspirin 325.  5. Protonix 40.  6. Norvasc 5 b.i.d.  7. Actos 45.  8. Toprol-XL 25.  9. ReQuip 1.  10.Multivitamin.  11.CPAP.  12.Crestor 20.  13.Imdur 120.   PHYSICAL EXAMINATION:  GENERAL:  The patient is currently in no  distress.  VITAL SIGNS:  His blood pressure is 106/63, pulse is 63, and weight 202.  NECK:  JVP is normal.  LUNGS:  Clear.  CARDIAC:  Regular rate and rhythm.  S1 and S2.  No murmurs.  ABDOMEN:  Benign.  EXTREMITIES:  No edema.   IMPRESSION:  1. Coronary artery disease, mild by cath, but microvascular disease      with TIMI 2 flow.  I have discussed with Alinda Deem indications      really opt for Ranexa I was considering, would keep him on same      medicines for now with nitro as needed.  2. Dyslipidemia.  Continue on statin.  3. Diabetes, on oral agents.  4. History of bladder cancer.  He has an appointment for followup.   I will set to see him back in the winter or sooner if problems  develop.     [name], MD, [facility]  Electronically Signed    PVR/MedQ  DD: 11/25/2007  DT: 11/26/2007  Job #: [id number]

## 2010-09-24 NOTE — Cardiovascular Report (Signed)
NAME:  JIRAH, DOEGE NO.:  [id number]   MEDICAL RECORD NO.:  [id number]          PATIENT TYPE:  OIB   LOCATION:  1962                         FACILITY:  [facility]   PHYSICIAN:  Shaune Pascal. Bensimhon, MDDATE OF BIRTH:  [birth year]   DATE OF PROCEDURE:  06/17/2007  DATE OF DISCHARGE:                            CARDIAC CATHETERIZATION   INDICATIONS:  [name] is a 55 year old male with a history of  hypertension, diabetes and tobacco use.  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  He underwent  cardiac catheterization in March 2007, which showed just minimal  nonobstructive coronary artery disease with TIMI-2 flow and his arteries  suggestive of microvascular disease or endothelial dysfunction.  He  recently has been having more chest pain and is referred for repeat  catheterization.  He does continue to smoke.  This was done in the  outpatient lab.   PROCEDURES PERFORMED:  1. Selective coronary angiography.  2. Left heart catheterization.  3. Left ventriculogram.  4. Abdominal aortogram.   DESCRIPTION OF PROCEDURE:  The risks and indication of procedure  explained.  Consent was signed and placed on the chart.  Right groin  area was prepped and draped in routine sterile fashion, anesthetized  with 1% local lidocaine.  A 4-French arterial sheath was placed in the  right femoral artery using a modified Seldinger technique.  Standard  catheters including a JL-4, 3-D RC and angled pigtail were used  procedure.  All catheter exchanges made over wire.  There were no  apparent complications.  Central aortic pressure is 103/76 with mean of  90.  LV was 113/7 with an EDP of 11.  There was no aortic stenosis.   Left main was normal.   LAD was a moderate-sized vessel that gave off a moderate-to-large  diagonal in the midsection and several small diagonals.  There is a 30%  lesion proximally.  The mid-to-distal LAD appeared to  have mild to  moderate diffuse disease, but also appeared to have a component of  vasospasm.  Intracoronary nitroglycerin was not administered.  Distally  it was TIMI-2 flow.   Left circumflex gave off a large OM-1 and three posterolaterals.  It was  angiographically normal and had borderline TIMI-2 flow.   Right coronary artery was moderate size codominant vessel that gave off  an RV branch and a PDA.  There was some mild luminal irregularities  throughout.  Flow was just minimally diminished.   Left ventriculogram done in the RAO position showed an EF of 60% with no  wall motion abnormalities.   Abdominal aortogram done to rule out any aneurysmal dilatation and  peripheral vascular disease given his smoking showed a 30% lesion in the  left renal.  The right renal was okay.  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   ASSESSMENT:  1. Mild nonobstructive coronary artery disease as described above.  2. TIMI-2 flow in most of the coronary beds likely secondary to      endothelial dysfunction or microvascular disease with a possible      component of  vasospasm.  3. Normal left ventricular function.  4. Thirty-percent left renal artery stenosis.   PLAN:  [name] truly has evidence of endothelial dysfunction and  perhaps microvascular disease and vasospasm.  He will need aggressive  risk factor management.  I emphasized to him that stopping smoking was  very, very important.  He also needed to be treated with aspirin,  nitroglycerin and possibly calcium channel blockers.  He will follow up  with Dr. Harrington Challenger.      Shaune Pascal. Bensimhon, MD  Electronically Signed     DRB/MEDQ  D:  06/17/2007  T:  06/18/2007  Job:  [id number]   cc:   [name], MD, [facility]

## 2010-09-24 NOTE — Op Note (Signed)
NAME:  Isaac [name], Isaac [name]            ACCOUNT NO.:  [id number]   MEDICAL RECORD NO.:  [id number]          PATIENT TYPE:  AMB   LOCATION:  [facility]                         FACILITY:  [facility]   PHYSICIAN:  Lucina Mellow. [name], M.D.DATE OF BIRTH:  1955/09/24   DATE OF PROCEDURE:  09/13/2008  DATE OF DISCHARGE:                               OPERATIVE REPORT   PREOPERATIVE DIAGNOSES:  History of bladder carcinoma and right ureteral  carcinoma.   POSTOPERATIVE DIAGNOSES:  History of bladder carcinoma and right  ureteral carcinoma.   PROCEDURE:  Cystoscopy, cold cup bladder biopsy, bilateral retrograde  pyelogram with interpretation and right ureteroscopy.   SURGEON:  Lucina Mellow. [name], M.D.   ANESTHESIA:  General.   INDICATIONS:  This gentleman has had bladder carcinoma going back to  January of 2005.  He has been treated with BCG.  He has also been  treated with Intron A.  He underwent right ureteroscopy and laser  therapy to two tiny right ureteral tumors in October of 2009.  He is  brought to the OR today for follow up evaluation and appropriate  therapy.   PROCEDURE:  The patient was brought to the operating room and placed in  lithotomy position and the external genitalia were prepped and draped in  usual fashion.  He was cystoscoped and the anterior and prostatic  urethra were unremarkable.  There were no obvious intravesical lesions  including papillary tumors or erythematous areas.   Using a cone-tip catheter and injecting contrast with this catheter  inserted through the cystoscope a left retrograde pyelogram was obtained  which showed a delicate nonobstructed ureter and pyelocaliceal system  with no filling defects.  In like fashion, a left retrograde pyelogram  was performed that unfortunately showed a filling defect in the ureter  just below the pelvic brim.  The pyelocaliceal system was normal.   I then inserted the 6-French rigid ureteroscope up the right ureter  without  difficulty and there was about a 2 cm area below the pelvic brim  that showed classic papillary tumors that were demonstrated on  photographic images.  On the last two fluoroscopy photos the  ureteroscope on film #6 is where the lesions begin and on photograph #7  the scope is above the area of concern.   I then performed random bladder biopsies across the bladder base and  fulgurated these with the Bugbee electrode.  The bladder was emptied,  the scope removed and the patient tolerated the procedure well with  essentially no blood loss.      Lucina Mellow. [name], M.D.  Electronically Signed     LJP/MEDQ  D:  09/13/2008  T:  09/13/2008  Job:  [id number]   cc:   [name], MD, [address] [place]  [place] [address]

## 2010-09-24 NOTE — Op Note (Signed)
NAME:  Isaac [name], Isaac [name]            ACCOUNT NO.:  [id number]   MEDICAL RECORD NO.:  [id number]          PATIENT TYPE:  AMB   LOCATION:  [facility]                         FACILITY:  [facility]   PHYSICIAN:  Bernestine Amass, M.D.  DATE OF BIRTH:  1955-11-28   DATE OF PROCEDURE:  DATE OF DISCHARGE:                               OPERATIVE REPORT   PREOPERATIVE DIAGNOSES:  1. Right distal ureteral tumor.  2. History transitional cell carcinoma of the bladder.   POSTOPERATIVE DIAGNOSES:  1. Right distal ureteral tumor.  2. History transitional cell carcinoma of the bladder.   PROCEDURES PERFORMED:  Cystoscopy, right ureteroscopy, biopsy of  ureteral tumor, brushing of ureteral tumor, and right retrograde  pyelogram, right double-J stent placement.   SURGEON:  Bernestine Amass, M.D.   ANESTHESIA:  General.   INDICATIONS:  Isaac [name] is currently 55 years of age.  He has been a  longstanding patient of Dr. Hessie Diener and has had recurrent  transitional cell carcinoma of the bladder.  These tumors have been  multifocal but low grade and noninvasive.  The patient has received  intravesical therapy on several occasions.  Recently, he was diagnosed  with what appeared to be a papillary tumor carpeting his distal ureter.  Dr. [name] did not do any biopsies and visually made that  determination.  The patient was sent to me for consideration of  nephroureterectomy versus other treatment options.  The patient does  have coronary artery disease along with hypertension and diabetes  mellitus.  We felt that any attempt to try to salvage his kidney might  do very well be reasonable given his situation.  We felt that  consideration needed to be given for the possibility of endoscopic  treatment as well as possible segmental resection of his ureter.  We  felt that pathologic assessment to determine if this was high or low  grade would also be potentially beneficial in his treatment options.  He  presents now for reassessment of the situation.   TECHNIQUE AND FINDINGS:  The patient was brought to the operating room.  He received perioperative antibiotics.  Appropriate surgical time-out  was performed.  The patient had successful induction of general  anesthesia and was placed in lithotomy position and prepped and draped  in the usual manner.  Cystoscopy revealed unremarkable anterior urethra.  No obvious visual obstruction.  The bladder itself, however, did show a  few patchy areas of erythema with some at least mild to moderate  trabeculation.  Initial capacity was only a few hundred mL,  but after  further relaxation with anesthesia, we were able to measure capacity of  his bladder of 800 mL.   Right retrograde pyelogram was performed.  This was done with an 8-  [place] cone-tip catheter with fluoroscopic interpretation.  One could  not see a definitive filling defect but there clearly was some mucosal  abnormalities which appeared to involve primarily the distal third of  the ureter.  More proximal ureter appeared normal.   Guidewire was placed to the right renal pelvis.  The distal ureter was  easily engaged with  a rigid ureteroscope.  We found there to be  carpeting of papillary tumor from just above the ureteral orifice  involving the majority of the distal border to 1/3 of the ureter.  Utilizing the ureteroscope, we measured the length of involvement of  approximately 7-8 cm.  The tumor carpeted the entire distal ureter and  appeared to involve the ureter circumferentially.  There did not appear  to be any option with regard to endoscopic management of this.  The  tumor itself visually appeared to be probably low grade papillary.  We  did cold cup biopsies x4 on some of the areas of involvement.  At the  completion of that, a brush was used for cytologic assessment.   At the completion of this, we decided to place a double-J stent.  We  used a 5-French Polaris 24-cm  stent which was confirmed to be in good  position.  The patient appeared to tolerate the procedure well and there  were no obvious complications or difficulties.  He was brought to the  recovery room in stable condition.      Bernestine Amass, M.D.  Electronically Signed     DSG/MEDQ  D:  10/11/2008  T:  10/11/2008  Job:  [id number]

## 2010-09-24 NOTE — Discharge Summary (Signed)
NAMEMarland Kitchen  ARMANIE, DOSSETT NO.:  [id number]   MEDICAL RECORD NO.:  [id number]          PATIENT TYPE:  INP   LOCATION:  [id number]                         FACILITY:  [facility]   PHYSICIAN:  Bernestine Amass, M.D.  DATE OF BIRTH:  1956-02-05   DATE OF ADMISSION:  10/30/2008  DATE OF DISCHARGE:  11/07/2008                               DISCHARGE SUMMARY   DISCHARGE DIAGNOSES:  1. 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  2. History of coronary artery disease.  3. Type 2 diabetes mellitus.   PROCEDURE PERFORMED:  Right distal ureterectomy with right ureteral  neocystostomy and psoas hitch.   HOSPITAL COURSE:  [name] is a 55 year old male.  He has a  longstanding history of transitional cell carcinoma of the bladder and  has had multiple recurrences, normally under the care of Dr. Hessie Diener.  He had been noted to have some papillary tumor involving his  distal right ureter.  This was visually observed by Dr. [name].  We  performed repeat endoscopy in saw multiple areas of papillary tumor in  the right distal ureter extending to about 7-8 cm above the ureteral  vesicle junction.  We did brushings and biopsies of these areas and  tumor was confirmed.  We felt that the nephroureterectomy would be an  over aggressive approach, but that, that segment of ureter did need to  be removed.  We suggested distal ureterectomy with reimplant and psoas  hitch or __________flap reconstruction.  The patient underwent distal  ureterectomy on October 30, 2008.  A psoas hitch was required to bridge the  gap between the bladder and the ureter and a reimplantation was  performed.  The patient has a double-J stent and a Foley catheter.  His  postoperative course was been relatively unremarkable.  He has had  issues with pain control and also bladder spasms, but otherwise his  situation has been relatively unremarkable.   The patient's final pathology showed acute inflammation  and some  granulation tissue within the ureter and no residual tumor was actually  noted on final pathology.  The patient did have an ileus which resolved.  Jackson-Pratt drain was always relatively minimal in output was removed  on approximately postoperative day #4.  The patient continued to have  excellent urinary output.  He did have an ileus which resolved and he  has currently been ambulating.  The patient is currently ambulating with  some assistance.  He is tolerating a carbohydrate modified diet without  difficulties.  He has had excellent urinary output.  His exam has  continued to show some bilateral lower extremity edema and some scrotal  edema.  There has been no evidence of any infection.  The patient's most  recent labs have been within normal limits.  The patient's hemoglobin on  November 06, 2008, was 11.1 with a normal white blood cell count.  The  patient's BUN and creatinine were 8 and 0.9 respectively.   DISPOSITION:  The patient is to be discharged for short-term  rehabilitation.  We feel he will benefit since he lives at home and his  family has had great concerns.   DISCHARGE MEDICATIONS:  1. Flomax 0.4 mg p.o. daily.  2. Wellbutrin 300 mg p.o. daily.  3. Protonix 40 mg p.o. b.i.d.  4. Isosorbide mononitrate 120 mg p.o. daily.  5. Norvasc 5 mg p.o. daily.  6. Metoprolol XL 25 mg p.o. daily.  7. Requip 1 mg p.o. daily.  8. Crestor 10 mg p.o. daily.  9. Amaryl 1 mg p.o. q.a.m.  10.Actos 45 mg p.o. daily at supper.  11.Januvia 50 mg p.o. at supper.  12.Metformin 1000 mg p.o. at supper.  13.Ditropan extended-release 10 mg p.o. daily.  14.Tylox 1-2 p.o. q.4-6 h., p.r.n.  15.Ciprofloxacin 250 mg p.o. b.i.d. to start on November 11, 2008, x7      days.  16.Colace 100 mg p.o. b.i.d.  17.Laxative of choice p.r.n.   Foley catheter will be left to drainage.   ADDITIONAL INSTRUCTIONS:  Removal of Foley catheter on November 13, 2008.  Removal of abdominal staples with  placement of Steri-Strips on November 13, 2008.      Bernestine Amass, M.D.  Electronically Signed     DSG/MEDQ  D:  11/07/2008  T:  11/07/2008  Job:  [id number]

## 2010-09-24 NOTE — Assessment & Plan Note (Signed)
[place] OFFICE NOTE   NAME:OREILLYJaven, Baldassare                    MRN:          [id number]  DATE:12/17/2006                            DOB:          03-15-1956    [name] is a gentleman I followed in clinic.  He has a history of  CAD by cardiac catheterization.  LAD had no angiographic CAD, but there  was TIMI II flow through the mid distal portion.  Left circumflex was  codominant.  Large OM normal.  There was no evidence of a ramus or  occluded ramus as documented previously.  RCA was codominant.  There  was, again, TIMI II flow in the PDA that was very small.  LV function  was 65%.  It was felt that, therefore, he had probably small vessel  disease.  I have managed him with vasodilators.  He is also being  treated for bladder cancer.   He comes in today for further evaluation.  He had been seen by Dr.  Renold Genta last week.  He has diffuse sweating.  His appetite is so-so.  He  gets winded with activity.  He has chest pain again with and without  activity.  Also complaining of fatigue.   CURRENT MEDICATIONS:  1. Wellbutrin 300.  2. Effexor 150.  3. Klonopin 5 b.i.d.  4. Aspirin 325 daily.  5. Protonix 40 daily.  6. Imdur 90.  7. Norvasc 5 b.i.d.  8. Metformin 1 g b.i.d.  9. Januvia 100 daily.  10.Actos 45 daily.  11.Toprol XL 25 daily.  12.Requip 1 daily.  13.Crestor 10.  14.Multivitamin.  15.CPAP.   PHYSICAL EXAM:  The patient is in no distress, but he is diffusely  sweating, saturating the paper on the table.  Blood pressure is 130/90, pulse is 64, weight 207.  NECK:  JVP is normal.  LUNGS:  No rales or wheezes.  CARDIAC:  Regular rate and rhythm.  S1, S2.  No S3.  No significant  murmurs.  ABDOMEN:  Benign.  EXTREMITIES:  No edema.   A 12-lead EKG shows sinus rhythm, 63 beats per minute.   IMPRESSION:  [name] has been difficult with his chest pain.  Again, he seems to have  microvascular disease versus spasm and I have  talked this over with his sister and the patient today.  I do not think  he is at risk for an acute event or dying suddenly now.  Again, I worry  about his bladder cancer, particularly with the diffuse sweating.   I have recommended for now that we increase his Imdur to 120 and we will  schedule for a Myoview scan.  If the Myoview is negative, schedule  followup in 3 months.   The patient has been going through so much.  I again favor that he be  considered for disability from the job he was working, which was very  high stress.  He could try to find other positions, but again, that is  up to him.  We will be in touch with him.     Nevin Bloodgood  Alcus Dad, MD, [facility]  Electronically Signed    PVR/MedQ  DD: 12/17/2006  DT: 12/18/2006  Job #: [id number]

## 2010-09-27 NOTE — Cardiovascular Report (Signed)
NAMECRISTOFHER, Isaac [name] NO.:  [id number]   MEDICAL RECORD NO.:  [id number]          PATIENT TYPE:  OIB   LOCATION:  1967                         FACILITY:  [facility]   PHYSICIAN:  Glori Bickers, M.D. LHCDATE OF BIRTH:  06/27/1955   DATE OF PROCEDURE:  07/31/2005  DATE OF DISCHARGE:  07/31/2005                              CARDIAC CATHETERIZATION   PRIMARY CARE PHYSICIAN:  Dr. Karle Starch [name].   ENDOCRINOLOGIST:  Dr. Dwyane Dee.   CARDIOLOGIST:  Dr. [name].   PATIENT IDENTIFICATION:  [name] is a 55 year old male with a history of  diabetes, hyperlipidemia, hypertension. He reportedly had a non-ST elevation  myocardial infarction in Juneof 2005. Cardiac catheterization at that time  showed a small occluded ramus branch, but otherwise normal coronary  arteries. He had a somewhat chronic chest pain which has been worse over the  past few weeks. This occurs both with exertion and at rest. He saw Dr.  Lia Foyer in the office for an unscheduled visit and was scheduled for cardiac  catheterization which was performed in the outpatient catheterization  laboratory.   PROCEDURES PERFORMED:  1.  Selective coronary angiography.  2.  Left heart cath.  3.  Left ventriculogram.   DESCRIPTION OF PROCEDURE:  Risks and benefits of catheterization were  explained to Isaac [name]; consent was signed and placed on the chart. A 4-  [place] arterial sheath was placed in the right femoral artery using a  modified Seldinger technique. Standard catheters including preformed Judkins  JL-4, JR-4 and angled pigtail were used for the procedure. All catheter  exchanges made over wire. There were no apparent complications.   Central aortic pressure was 83/57, mean of 70. LV pressure was 90/8 with EDP  of 14. There was no aortic stenosis.   Left main was normal.   LAD was a long vessel coursing to the apex. It gave off several septal  perforators and a large diagonal in the mid-portion.  There was no  angiographic CAD. However, there was TIMI II flow with evidence of vasospasm  through the mid-portion and distal LAD.   Left circumflex was a large codominant vessel with a large OM-1 and three  posterior laterals. It was angiographically normal with normal flow. I did  not visualize any evidence of a ramus or previous occluded ramus branch as  documented previously.    Right coronary artery was a moderate-sized codominant vessel. It gave off an  RV branch and terminated in a small PDA. There was TIMI II flow in the very  distal vessel without any significant angiographic CAD.   Left ventriculogram done in the RAO approach showed an EF of 65% with no  mitral regurgitation or wall motion abnormalities.   ASSESSMENT:  1.  Normal coronary arteries without any angiographic coronary artery      disease. However, there is slightly slow flow in the mid to distal left      anterior descending artery and the distal right coronary artery      consistent likely due to vasospam +/- microvascular disease.  2.  Normal left ventricular function.  PLAN/DISCUSSION:  At this point, we will continue medical management with  calcium channel blocker, nitroglycerin and Lipitor for his vasospasm and  possible microvascular disease. Unfortunately, given his low blood pressure,  we do not have room to titrate these medications at this time.      Glori Bickers, M.D. [facility]  Electronically Signed     DB/MEDQ  D:  07/31/2005  T:  08/01/2005  Job:  [id number]   cc:   [name]. Renold Genta, M.D.  Fax: [phone]   Elayne Snare, M.D.  Fax: [phone]   [name], M.D.  [address] [place]  [place] [address]

## 2010-09-27 NOTE — H&P (Signed)
NAME:  Isaac [name], Isaac [name] NO.:  [id number]   MEDICAL RECORD NO.:  [id number]                   PATIENT TYPE:  INP   LOCATION:  3701                                 FACILITY:  [place]   PHYSICIAN:  [name], M.D.                 DATE OF BIRTH:  01-12-1956   DATE OF ADMISSION:  11/02/2003  DATE OF DISCHARGE:                                HISTORY & PHYSICAL   IDENTIFICATION:  The patient is a 55 year old gentleman who presents to the  emergency room for evaluation of chest pain.   HISTORY OF PRESENT ILLNESS:  The patient has no known coronary artery  disease.  He notes that for the past couple of months occasional episodes  (about one time per week) of chest pressure.  It would occur with activity.  When the patient was at work, sometimes he would spells that he would have  to sit down to let the pressure ease.  It will also occur after working on a  treadmill.  He will walk for about 30-40 minutes and will have chest  pressure near the end.  It will again ease with rest.  The patient has also  had spells where he has woken up and with initial activity has had some  chest pressure that eases off on its own.   This morning he woke up with substernal chest pressure radiating to his jaw  associated with shortness of breath, sweating, and nausea.  He took Rolaids  without relief and went to bed.  He found that laying flat on his bed gave  him the best relief and he went on to sleep.  He awoke at 4:45 a.m. and the  pain was still present.  Positive diaphoresis.  He called his primary care  Maecyn Panning, who told him to come to the ER.  The patient was seen in the  clinic yesterday by his primary care Bobie Caris (Dr. Renold Genta) and was set up  for a stress test.   ALLERGIES:  None.   MEDICATIONS:  1. Humalog 28 units at breakfast and 14 units at supper.  2. Vicodin p.r.n.  3. Protonix 40 mg daily.  4. Klonopin b.i.d.  5. Wellbutrin XL 30 mg daily.  6. Effexor  75 mg daily.   PAST MEDICAL HISTORY:  1. Diabetes.  2. Diagnosed a year-and-a-half ago with bladder cancer.  Removed in February     of 2005.  3. 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  4. History of thyroid problems one year ago.  5. History of tendon repair of right elbow.   SOCIAL HISTORY:  The patient lives in [place], [place].  He works at  [employer].  He is single.  Quit tobacco in April of 2003, 32-pack-  year.  Drinks six beers per week.  Walks on a treadmill.   FAMILY HISTORY:  His father had bypass surgery at age 5.  His mother died  of carbon monoxide poisoning.  One sister with depression.   REVIEW OF SYSTEMS:  The patient notes increased fatigue over the past  several months (six months).  History of GE reflux.   PHYSICAL EXAMINATION:  GENERAL APPEARANCE:  The patient is in no distress.  Minimal discomfort.  VITAL SIGNS:  Blood pressure 144/87, pulse 76, temperature 97.5 degrees.  NECK:  No bruits.  JVP is normal.  LUNGS:  Clear.  CARDIAC:  Regular rate and rhythm.  S1 and S2.  No S3 or S4.  ABDOMEN:  No hepatomegaly.  No bruits.  Benign.  EXTREMITIES:  Good distal pulses.  No lower extremity edema.   LABORATORIES:  Significant for a hemoglobin of 15.5.  BUN and creatinine 21  and 1.1, respectively.  Potassium 3.9.  Initial CK less than 0.05, the  second was 0.09, and the third was 0.28.  MB fraction of 2, 2.9, and 4.8.  EKG shows normal sinus rhythm and no acute ST changes.  Chest x-ray with  borderline heart size and no acute disease.   IMPRESSION:  The patient is a 55 year old gentleman with diabetes and a  history of tobacco use in the past, who presents with increasing chest pain.  Enzymes show cardiac injury.  EKG without changes.  Currently with minimal  discomfort.  I would recommend admitting the patient for cardiac  catheterization.  The risks and benefits were described.  The patient  understands and agrees to proceed.   Will check a fasting lipid profile.  He  should be on a statin.  Will initiate aspirin, Lovenox, Plavix, and beta  blocker.                                                [name], M.D.    PVR/MEDQ  D:  11/02/2003  T:  11/04/2003  Job:  [id number]

## 2010-09-27 NOTE — Cardiovascular Report (Signed)
NAME:  Isaac [name], Isaac [name]                      ACCOUNT NO.:  [id number]   MEDICAL RECORD NO.:  [id number]                   PATIENT TYPE:  INP   LOCATION:  3701                                 FACILITY:  [facility]   PHYSICIAN:  Ernestine Mcmurray, M.D. [facility]             DATE OF BIRTH:  [birth year]   DATE OF PROCEDURE:  11/03/2003  DATE OF DISCHARGE:                              CARDIAC CATHETERIZATION   CARDIOLOGIST:  [name], M.D.   PROCEDURES PERFORMED:  1. Left heart catheterization with selective coronary angiography.  2. Ventriculography.   DIAGNOSES:  1. Single-vessel coronary disease with an occluded ramus branch.  2. Non-Q-wave myocardial infarction.   INDICATION:  The patient is a 55 year old diabetic male who was admitted  with sudden onset of substernal chest pain.  The patient had no EKG changes,  but had positive troponins and ruled in for a non-ST elevation myocardial  infarction.  He is now referred for diagnostic catheterization to assess his  coronary anatomy.   DESCRIPTION OF PROCEDURE:  After informed consent was obtained, the patient  was brought to the catheterization laboratory.  The right groin was  sterilely prepped and draped.  A 6-French arterial sheath was placed using  the modified Seldinger technique.  The 6-French JL-4 and JR-4 catheters were  used for coronary angiography.  A 6-French pigtail catheter was used for  ventriculography.  No complications were encountered during the procedure,  and at the termination of the procedure, all catheters and sheaths were  removed.  Of note was that the patient did experience substernal chest pain  after injection of the left coronary system.  This resolved with morphine  and nitroglycerin spray as well as a small dose of intravenous beta blocker  therapy.  At the termination of the case, the patient was pain free.   FINDINGS:   HEMODYNAMICS:  1. Left ventricular pressure 103/60 mmHg.  2. Aortic pressure 103/60  mmHg.  3. There was no gradient upon pullback across the aortic outflow tract.   VENTRICULOGRAPHY:  Ejection fraction 60-65% with no segmental wall motion  abnormalities.   SELECTIVE CORONARY ANGIOGRAPHY:  1. The left main coronary artery is a moderate size vessel with no evidence     of flow-limiting disease.  Overall, the patient had relatively small     vessels, likely secondary to underlying diabetes.  2. The LAD was a moderate size vessel wrapping around the apex giving rise     to several diagonal branches with flow-limiting lesions.  3. Circumflex coronary artery was a large caliber vessel with no evidence of     flow-limiting lesions.  PDA was also free of flow-limiting lesions.  4. Ramus:  The patient had a long but very small ramus branch which was     substernally occluded in the proximal segment which is likely the infarct-     related artery.  5. The right coronary artery was nondominant and  had no flow-limiting     lesions.   RECOMMENDATIONS:  It appears that the ramus intermedius is the infarct-  related artery.  Although this vessel is a long vessel, it is very small,  probably less than 1 mm, and it is not favorable for percutaneous  coronary  intervention.  The angiographic findings were reviewed with Dr. Olevia Perches, and  he also agreed to continue medical treatment.  The patient was given Imdur  as well as aspirin.  Consideration could be given to nine months of Plavix.  The patient also will need to continue statin drug therapy.  We anticipate  that the patient will be admitted for the next 24-48 hours to make sure that  he is completely pain free.                                               Ernestine Mcmurray, M.D. [facility]    GED/MEDQ  D:  11/03/2003  T:  11/04/2003  Job:  [id number]   cc:   [name]. Renold Genta, M.D.  [address]  [place]  [place] [address]  Fax: [phone]   [name], M.D.

## 2010-09-27 NOTE — Op Note (Signed)
NAME:  Isaac [name], Isaac [name]            ACCOUNT NO.:  [id number]   MEDICAL RECORD NO.:  [id number]          PATIENT TYPE:  OIB   LOCATION:  [id number]                         FACILITY:  [facility]   PHYSICIAN:  Youlanda Mighty. Sypher Brooke Bonito., M.D.DATE OF BIRTH:  [birth year]   DATE OF PROCEDURE:  02/08/2004  DATE OF DISCHARGE:                                 OPERATIVE REPORT   PREOPERATIVE DIAGNOSIS:  Chronic stage II/III impingement, right shoulder,  with plain film and MRI evidence of significant acromioclavicular  degenerative arthritis and evidence of supraspinatus tendinopathy with a  partial-thickness or near-full-thickness tear of the anterior supraspinatus  tendon noted on the MRI.   POSTOPERATIVE DIAGNOSIS:  Chronic stage II/III impingement, right shoulder,  with plain film and MRI evidence of significant acromioclavicular  degenerative arthritis and evidence of supraspinatus tendinopathy with a  partial-thickness or near-full-thickness tear of the anterior supraspinatus  tendon noted on the MRI with identification of severe acromioclavicular  arthropathy, a prominent type 2 acromion and intra-articular identification  of degenerative tendinopathy of the superior half of the subscapularis  tendon at its insertion anteriorly and a 70% partial-thickness tear of the  anterior supraspinatus at the rotator interval.   OPERATION:  1.  Diagnostic arthroscopy, right glenohumeral joint, with debridement of      the subscapularis and supraspinatus partial-thickness tendon      degenerative tears.  2.  Subacromial decompression with coracoacromial ligament release,      bursectomy and acromioplasty.  3.  Open distal clavicle resection.   OPERATING SURGEON:  Youlanda Mighty. Sypher, M.D.   ASSISTANT:  Julian Reil, P.A.   ANESTHESIA:  General by LMA.   SUPERVISING ANESTHESIOLOGIST:  Crissie Sickles. Conrad [place], M.D.   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   I initially met [name] in January of 2005 after a fall in which he  fell sustaining significant injury to his right upper extremity.   Clinical examination revealed a loose body in his elbow and signs of a  flexor pronator muscle injury.  Serial examinations documented a complete  rupture of the medial collateral ligament of the elbow and a disruption of  the flexor pronator muscles as well as an intra-articular loose body.   On July 07, 2003, [name] underwent a procedure with Dr. Daryll Brod  and myself consisting of elbow arthroscopy followed by reconstruction of his  elbow medial collateral ligament.  He successfully rehabilitated his elbow  during the spring of 2005, but during rehabilitation, had progressive pain  in his shoulder with stiffening of the shoulder.   Dr. Fredna Dow referred him for an MRI of his shoulder on August 28, 2003,  interpreted by Ophelia Charter at [facility] to reveal degenerative changes  of the [facility] joint with encroachment upon the subacromial outlet and  supraspinatus tendinopathy without evidence of retracted full-thickness  tear.   Over-reading of the films revealed signs of a significant partial-thickness  and even perhaps a full-thickness tear of the anterior supraspinatus.   [name] did not respond to therapy and has had  a number of intercurrent  severe medical problems including bladder cancer, a myocardial infarction  and a diagnosis of sleep apnea.   All of these issues have been evaluated by the physicians at [facility], [facility] and a sleep study.   He has been stabilized and is now prepared for decompression of his right  shoulder.   Preoperatively, we advised him that we may consider repair of the rotator  cuff if we found a full-thickness tear.   PROCEDURE:  Isaac [name] was brought to the operating room and placed in  a supine position on the operating  table.   An interscalen block had been placed in the holding area, leading to  complete anesthesia of the right forequarter.   General orotracheal anesthesia was induced followed by careful positioning  in the beach chair position with Beta torso and head-holder designed for  shoulder arthroscopy.   Procedure commenced with distention of the shoulder joint with 20 mL of  sterile saline with a spinal needle posteriorly.  Diagnostic arthroscopy  revealed intact hyaline and articular cartilages on the glenoid and humeral  head.  The long head of the  biceps was intact.  The labrum was basically  sound.  Deep surface of the subscapularis was inspected and found to be  quite degenerative with fibrillar tearing of the superior half of the  subscapularis at its insertion.  The supraspinatus and infraspinatus tendons  were well-visualized.  The anterior half of the supraspinatus had  considerable fibrillar degeneration.  An anterior portal was created and  both the subscapularis and supraspinatus were debrided of all necrotic-  appearing tendon tissues.  The subscapularis was about a 50% tear and the  supraspinatus about a 70% tear in the anterior half.  There was no sign of a  full-thickness retracted tear.  The remainder of the glenohumeral exam was  normal.  The biceps anchor was stable, the anterior glenohumeral ligaments  were noted to be normal and the biceps tendon was normal to the rotator  interval.   Arthroscope was removed from the glenohumeral joint and placed in the  subacromial space.  There was a florid bursitis noted.  After thorough  bursectomy, the anatomy of the coracoacromial arch was studied.  The distal  clavicle was quite prominent.  The cutting cautery was used to release the  coracoacromial ligament and approximately a 1-cm segment of the ligament was  removed.  Hemostasis was achieved with bipolar cautery.  The acromion was leveled to a type I morphology and a  thorough bursectomy revealed some  fibrillar degeneration of the supraspinatus, but again, no sign of a  retracted rotator cuff tear.   The capsule of the [facility] joint was taken down with the cutting cautery,  followed by use of a suction bur to remove the capsule.  At this point, we  converted to an open procedure to resect the distal 15 mm of clavicle.  A 2-  cm incision was fashioned directly over the distal clavicle, followed by  subperiosteal dissection, revealing the distal 15 mm of capsule and release  of the superior acromioclavicular ligaments.  The oscillating saw was used  to remove the distal 15 mm of clavicle without difficulty.  This was removed  and the resected surface palpated.  There were no medial osteophytes on the  acromion following the arthroscopic decompression.   The dead space created by the distal clavicle resection was closed with  mattress suture of #2 FiberWire, followed  by repair of the skin with  subdermal sutures of 2-0 Vicryl and intradermal 3-0 Prolene with Steri-  Strips.   The arthroscopic portals were then irrigated and the scope replaced in the  subacromial space to assure hemostasis.  There was some bleeding from the  bursa that was controlled with bipolar cautery.  Final photographic  documentation of the decompression was accomplished.   The wounds were then repaired with intradermal 3-0 Prolene mattress sutures.   A compressive dressing was applied with Hypafix over Xeroflo and sterile  gauze.  There were no apparent complications.       RVS/MEDQ  D:  02/08/2004  T:  02/09/2004  Job:  [id number]   cc:   Daryll Brod, M.D.  [address]  [place]  [place] [address]  Fax: [place] Renold Genta, M.D.  [address], [place]  [place] [address]  Fax: [phone]

## 2010-09-27 NOTE — H&P (Signed)
NAMEMarland [name]  [name], [name]            ACCOUNT NO.:  [id number]   MEDICAL RECORD NO.:  [id number]          PATIENT TYPE:  INP   LOCATION:  0101                         [facility]AC[facility]   PHYSICIAN:  Edsel Petrin. Dalbert Batman, M.D.DATE OF BIRTH:  [birth year]   DATE OF ADMISSION:  08/03/2004  DATE OF DISCHARGE:                                HISTORY & PHYSICAL   CHIEF COMPLAINT:  Right lower quadrant abdominal pain.   HISTORY OF PRESENT ILLNESS:  This is a 55 year old white man, who noted the  onset of right lower quadrant pain at 11 a.m. yesterday.  The pain has been  steady in character, progressive in intensity, associated with nausea but no  vomiting.  He does not really think he has had any fever or chills.  He has  been voiding okay.  He has had 2 episodes of diarrhea which is a bit unusual  for him.  He has not had any prior similar problems.  [facility]  They sent him to the [facility].  Dr. Eulis Foster saw the  patient.  A CT scan was obtained which shows an inflammatory mass at the  base of the appendix but no free fluid or abscess.  I was asked to see him  at that point.  I think he has appendicitis as the most likely diagnosis.  He is being admitted and being prepared for surgery.   PAST HISTORY:  1.  Diabetes mellitus which was out of control requiring hospitalization in      May 2005.  2.  Sleep apnea, followed by Dr. Danton Sewer.  Needs a CPAP machine at      home.  3.  Umbilical hernia repair with mesh by Dr. Zella Richer in November 2004.  4.  Coronary artery disease with myocardial infarction, November 02, 2003.  Dr.      Harrington Challenger arranged for cardiac catheterization.  He was found to have small      vessels, not a candidate for bypass, on Plavix and aspirin.  5.  Bladder cancer, in February 2005, underwent TUR of bladder tumor by Dr.      [name] with 6 instillations of BCG.  6.  Right elbow surgery, February 2005.  7.  Right shoulder surgery, September 2005.   CURRENT MEDICATIONS:  1.  Toprol.  2.  Norvasc.  3.  Lipitor.  4.  Trazodone.  5.  Wellbutrin.  6.  Effexor.  7.  Protonix.  8.  Plavix.  9.  Aspirin.  10. Imdur.  11. Actos.  12. Glipizide.  13. Requip.  All doses unknown.   DRUG ALLERGIES:  None known.   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  He used to smoke  but quit 3 years ago.  He drinks 1 beer a day.   FAMILY HISTORY:  Mother died of carbon monoxide poisoning as an accident in  her garage.  Father living, has had coronary artery bypass surgery.  Sister  living and well.  Grandmother has  diabetes.   REVIEW OF SYSTEMS:  Fifteen system review of systems is performed.  It is  noncontributory except as described above.   PHYSICAL EXAMINATION:  GENERAL:  A pleasant, middle-aged gentleman, somewhat  overweight in mild to moderate distress, very cooperative and oriented and  appropriate.  VITAL SIGNS:  Temp 98.9, pulse 79, respirations 18, blood pressure 130/83.  EYES:  Sclerae clear.  Extraocular movements intact.  EAR/NOSE/MOUTH/THROAT:  Nose, lips, tongue, and oropharynx are without gross  lesions.  NECK:  Supple, nontender, no thyroid mass, no adenopathy, no jugular venous  distention.  LUNGS:  Clear to auscultation.  No chest wall tenderness.  HEART:  Regular rate and rhythm, no murmurs.  Radial and femoral pulses are  palpable.  No peripheral edema.  ABDOMEN:  He has a transverse scar below the umbilicus about 8 cm in length.  There is no hernia there.  He is tender with involuntary guarding and direct  rebound in the right lower quadrant.  I cannot really feel a mass, but he is  guarding too much to be sure.  He seems relatively soft elsewhere.  Liver  and spleen do not appear enlarged.  No inguinal adenopathy or hernia.  EXTREMITIES:  He moves all 4 extremities well without pain or deformity.  NEUROLOGIC:  No gross  motor or sensory deficits.   ADMISSION DATA:  The CT scan is reviewed with the radiologist.  They state  that there is an inflammatory mass of the appendix, more at the base than at  the tip, but there is no sign of any rupture or abscess.  No other  abnormalities noted.  White blood cell count 8900, but he does have a left  shift.  Glucose 104, creatinine 1.0, urinalysis negative.   ASSESSMENT:  1.  Acute appendicitis is the most likely diagnosis.  Differential diagnosis      would include cancer of the cecum, diverticulitis of the cecum, Crohn's      disease, or other inflammatory or neoplastic process.  Appendicitis is      most likely.  2.  Coronary artery disease, on Plavix and aspirin.  3.  Diabetes mellitus.  4.  Hypertension.  5.  Sleep apnea.  6.  Status post umbilical hernia repair with mesh.  7.  Bladder cancer, status post TUR bladder tumor.   PLAN:  The patient will be taken to the operating room for diagnostic  laparoscopy and either laparoscopic or open appendectomy.   I have discussed the indications and details of surgery with the patient.  I  have discussed the differential diagnosis with him, and he understands that  this may be a simply appendectomy or may be a more complex bowel resection  depending on diagnosis.   I have discussed the indications and details of surgery with him.  Risks and  complications have been outlined, including but not limited to bleeding,  infection; conversion to open laparotomy; wound problems such infection or  hernia, injury to adjacent organs such as the ureter, intestine, bladder  with major reconstructive surgery; cardia, pulmonary, and thromboembolic  problems and wound problems.  He seems to understand these issues well.  At  this time, all of his questions are answered.  He is in full agreement with  this plan.      HMI/MEDQ  D:  08/03/2004  T:  08/03/2004  Job:  [id number]  cc:   [name]. Renold Genta, M.D.  [address]  [place]  [id number]  Fax: [phone]   Danton Sewer, M.D. [facility]   [employer]. [name], M.D.  [place]. [address], [address] [place]   [address]  Fax: [phone]

## 2010-09-27 NOTE — Letter (Signed)
August 31, 2007     RE:  [name], [name]  MRN:  [id number]  /  DOB:  1955-10-20   To Whom It May Concern:   This is a letter regarding [name].  He is a 55 year old  gentleman who I follow in cardiology clinic.  He has significant  microvascular coronary disease and has had significant symptoms from  this.  He also has moderate macrovascular disease.  I have followed him  for several years and I am working with medicines to help improve his  symptoms.   He has had problems with angina, particularly with stress.  In my  viewpoint, he should take activities only as tolerated.  His other  medical problems, as well, have also put a strain on him.   If he has spells of angina, he should stop and try to get control of the  situation.  In this regard, I do not think he is fit to work.  I would  support his move for Disability.   If you have any questions, please feel free to contact me at [phone].    Sincerely,      [name], MD, [facility]  Electronically Signed    PVR/MedQ  DD: 08/31/2007  DT: 08/31/2007  Job #: [id number]

## 2010-09-27 NOTE — Op Note (Signed)
NAME:  Isaac [name], Isaac [name]                      ACCOUNT NO.:  [id number]   MEDICAL RECORD NO.:  [id number]                   PATIENT TYPE:  AMB   LOCATION:  [facility]                                 FACILITY:  [facility]   PHYSICIAN:  Lucina Mellow. [name], M.D.             DATE OF BIRTH:  1955-07-31   DATE OF PROCEDURE:  06/07/2003  DATE OF DISCHARGE:                                 OPERATIVE REPORT   PREOPERATIVE DIAGNOSIS:  Bladder carcinoma.   POSTOPERATIVE DIAGNOSIS:  Bladder carcinoma.   PROCEDURE:  Cystoscopy, bilateral retrograde pyelograms with interpretation  and resection of bladder tumor, transurethral resection of bladder tumor.   SURGEON:  Lucina Mellow. [name], M.D.   ANESTHESIA:  General.   INDICATIONS FOR PROCEDURE:  This 56 year old white male had initial  hematuria in the last couple of months and he was seen in the office and he  had microhematuria and stone protocol CT scan revealed lesion on the bladder  base but no stones were seen.  No renal masses were seen.  He was  cystoscoped and he had a raised papillary tumor just on the right trigone  and no other lesions were noted and he was brought to the OR today for  resection of this lesion and further evaluation.   DESCRIPTION OF PROCEDURE:  The patient was brought to the operating room and  placed in lithotomy position.  The external genitalia are prepped and draped  in the usual fashion.  He was cystoscope, the anterior urethra and prostatic  urethra were unremarkable.  The only lesion in the bladder was a 3 cm  papillary lesion overlying the right intramural ureter and it was on a  fairly narrow stalk.  The cone tip catheter was utilized to perform  bilateral retrograde pyelograms which showed no obstruction or filling  defects in the upper tracts. I left the cone tip catheter in the right  ureter since this lesion was adjacent to it.   I resected the lesion completely by using the cold cup bladder biopsy  forceps at this  relatively narrow stalk to remove this 3 cm bladder tumor  completely.  Hemostasis was obtained with use of the Bugbee electrode and  was also used to coagulate the biopsy site and surrounding mucosa destroying  any residual tumor.  At this point, there  was good hemostasis, I injected contrast through the ureteral catheter and  the right ureter was delicate with no extravasation and drained properly so  I felt that he did not need a double J catheter.  He was taken to the  recovery room in good condition having tolerated the procedure well.                                               Lucina Mellow. [name], M.D.  LJP/MEDQ  D:  06/07/2003  T:  06/07/2003  Job:  [id number]   cc:   [name]. Renold Genta, M.D.  [address], [place]  [place] [address]  Fax: [phone]

## 2010-09-27 NOTE — Op Note (Signed)
NAMEMarland [name]  MARGUES, EXUME NO.:  [id number]   MEDICAL RECORD NO.:  [id number]          PATIENT TYPE:  INP   LOCATION:  [id number]                         FACILITY:  [facility]   PHYSICIAN:  Edsel Petrin. Dalbert Batman, M.D.DATE OF BIRTH:  Sep 07, 1955   DATE OF PROCEDURE:  08/03/2004  DATE OF DISCHARGE:                                 OPERATIVE REPORT   PREOPERATIVE DIAGNOSIS:  Acute appendicitis.   POSTOPERATIVE DIAGNOSIS:  Acute appendicitis.   OPERATION PERFORMED:  Laparoscopic appendectomy.   SURGEON:  Dr. Fanny Skates   OPERATIVE INDICATIONS:  This is a 55 year old white man with diabetes,  coronary artery disease, sleep apnea, and a history of an umbilical hernia  repair with mesh and a history of bladder cancer.  He presented with a 24-  hour history of right lower quadrant pain and nausea.  He had localized  tenderness in the right lower quadrant.  His white blood cell count was  normal, but a CT scan showed an obviously inflamed periappendiceal area,  consistent with acute appendicitis.  He is brought to the operating room  urgently.   OPERATIVE FINDINGS:  The patient had acute appendicitis.  The appendix was  clearly edematous, inflamed, and had an exudate on it.  The appendiceal  mesentery was thickened and edematous as well but once we had skeletonized  the appendiceal mesentery, the point of the appendix insertional to the  cecum was fairly soft, and there did not appear to be any mass of the  appendix.  The terminal ileum and right colon looked normal.  The liver  looked normal.  The pelvis looked normal.  There was no ascites.  There was  no abscess.   OPERATIVE TECHNIQUE:  Following the induction of general endotracheal  anesthesia, a Foley catheter was inserted.  The patient's abdomen was  prepped and draped in a sterile fashion.  Intravenous antibiotics had been  given within the past hour.  Marcaine 0.5% with epinephrine was used as a  local infiltration  anesthetic.  A short transverse incision was made at the  lateral border of the lateral border of the left rectus sheath just above  the level of the umbilicus.  Using a 0-degree scope and an OptiView port, an  OptiView port was inserted without any difficulty, and we found that we did  not have any adhesions under the umbilicus.  There was no mesh visible from  the abdominal cavity.  A 5 mm trocar was placed in the left rectus muscle  just below the umbilicus, and a 12 mm trocar was placed in the left  suprapubic area.  Exploration was carried out with findings as described  above.  The patient was positioned tilted to the left and in Trendelenburg  position.  We elevated the tip of the appendix.  We took down a few  adhesions.  We very slowly, very carefully used the harmonic scalpel to  divide the appendiceal mesentery, being careful to get excellent hemostasis  throughout.  Once we had skeletonized the appendiceal mesentery, we  visualized the insertion of the appendix into the base of the cecum.  A  EndoGIA stapler with a white vascular load was inserted and placed across  the cecum at the base of the appendix.  This was held in place for 60  seconds and then fired and removed.  The appendix was placed in the specimen  bag and removed.  The trocar was reinserted.  We spent some time irrigating  the operative field and watching for any bleeding.  We did not identify any  major bleeders anywhere, but there was just a slight amount of red  irrigation fluid that persisted.  I therefore used a piece of Surgicel gauze  and placed it over the staple line and on the raw surface of where we had  divided the appendiceal mesentery.  This was observed for about 5 minutes,  and we had excellent hemostasis.  I made sure that all the rest of the  irrigation fluid was removed.  The trocars were removed carefully under  direct vision and observed, and there was no bleeding from the trocar sites.  The  pneumoperitoneum was released.  The skin incisions were then closed  with subcuticular sutures of 4-0 Monocryl and Steri-Strips.  Clean bandages  were placed and the patient taken to recovery room in stable condition.  Estimated blood loss was about 15 mL.  Complications were none.  Sponge,  needle, and instrument counts were correct.      HMI/MEDQ  D:  08/03/2004  T:  08/03/2004  Job:  [id number]   cc:   [name]. Renold Genta, M.D.  [address]  [place]  [place] [address]  Fax: [phone]   Danton Sewer, M.D. [facility]

## 2010-09-27 NOTE — Consult Note (Signed)
NAME:  Isaac [name], Isaac [name]                      ACCOUNT NO.:  [id number]   MEDICAL RECORD NO.:  [id number]                   PATIENT TYPE:  INP   LOCATION:  5027                                 FACILITY:  [facility]   PHYSICIAN:  Elayne Snare, M.D.                    DATE OF BIRTH:  22-Feb-1956   DATE OF CONSULTATION:  DATE OF DISCHARGE:                                   CONSULTATION   REASON FOR CONSULTATION:  Diabetes management.   HISTORY:  This is a 55 year old Caucasian patient admitted yesterday with  poorly controlled diabetes.  The patient apparently was told last June to  have mild glucose intolerance, and was sent for some nutritional education.  However, the patient did not do any home glucose monitoring, and did not  have any symptoms at that time.  According to the chart, his hemoglobin A1c  in October of last year was up to 7%.   He says that for the last two weeks or so, he has started getting increasing  fatigue and lethargy.  He also has had increased thirst and urination as  well as blurred vision.  He also increased appetite.  He probably has lost  some weight.  According to his chart, he has lost about eight pounds this  year.  He apparently had been drinking a lot of regular soft drinks,  especially since he was told not to have any beer while taking pain  medications for his various operations that he has had.  Since his blood  sugar was found to be high again last week, he had stopped drinking these  regular soft drinks and is drinking more water.  He also gives a history of  steroid injection in his right shoulder, given by his orthopedic surgeon for  rotator cuff problem.  He was found to have a blood sugar of 316 on the 25th  of this month, and was given Glucotrol 5 mg.  Two days later, however, his  sugar was 528.  Because of ketonuria in the office, he was admitted for  management and rehydration.   On admission to the hospital yesterday, his blood sugar was 369,  and serum  ketones were negative.  He was not found to be in acidosis.  He did have  some small ketones in his urine.  He had been given Regular insulin last  evening, 10 units intravenous first and then total of 12 units  subcutaneously, with which his blood sugar came down to 139 last night, but  at lunch time today, it is up to 264.  He had received 20 NPH and 3 Regular  this morning at breakfast.   He does feel much better with his thirst and urination, and he feels a  little clearer in his head also.   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  He has  had umbilical hernia repair last November.  In February, he  had mediolateral ligament reconstruction of the right elbow.   MEDICATIONS ON ADMISSION:  1. Bextra.  2. Wellbutrin.  3. Glucotrol.  4. Protonix.  5. Effexor.   FAMILY HISTORY:  Grandmother had diabetes.   SOCIAL HISTORY:  He is single.  He is a nonsmoker.   REVIEW OF SYSTEMS:  He has had some depression and reflux problems.  He  otherwise had been healthy except for the surgical problems mentioned above.  He also had had pneumonia in 1995 and 1999.   He has not had any hypertension, any GI problems, or chest pain.  No history  of thryoid disease.  He has occasional numbness in his right toes only in  certain positions.  He does not have any other sensory symptoms in his feet  or hands.  No history of muscle weakness.   PHYSICAL EXAMINATION:  GENERAL:  The patient is heavy-set, pleasant, and  alert.  No pallor present, no lymphadenopathy in his neck.  VITAL SIGNS:  Blood pressure today is 178/79, pulse 70.  HEENT:  Eyes showed no adenopathy.  Fundus exam, ENT exam normal.  Thyroid  is not palpable.  HEART:  Sounds are normal.  LUNGS:  Clear.  ABDOMEN:  No mass or tenderness.  EXTREMITIES:  No edema or skin lesions.  He has normal ankle reflexes.  No  sensory loss in his feet by touch.  Gait was not tested.   ASSESSMENT:   The patient has marked hyperglycemia without ketoacidosis.  He  probably has type 2 diabetes which is relatively insulin dependent as he has  responded well to hydration and small amounts of insulin.  His diabetes is  clearly worse with intraarticular steroid last week.  Because of the glucose  toxicity, he will need insulin currently for management.  He may well be on  oral agents subsequently.   PLANS:  Detailed in the chart.                                               Elayne Snare, M.D.    AK/MEDQ  D:  09/09/2003  T:  09/09/2003  Job:  [id number]   cc:   [name]. Renold Genta, M.D.  [address] [place]  [place] [address]  Fax: [phone]

## 2010-09-27 NOTE — Discharge Summary (Signed)
NAME:  Isaac [name], Isaac [name]                      ACCOUNT NO.:  [id number]   MEDICAL RECORD NO.:  [id number]                   PATIENT TYPE:  INP   LOCATION:  5027                                 FACILITY:  [facility]   PHYSICIAN:  [name]. Renold Genta, M.D.                DATE OF BIRTH:  09/29/1955   DATE OF ADMISSION:  09/08/2003  DATE OF DISCHARGE:  09/11/2003                                 DISCHARGE SUMMARY   FINAL DIAGNOSES:  1. Hyperglycemia.  2. Ketonuria.  3. History of papillary carcinoma of the bladder.  4. History of anxiety/depression.  5. Right shoulder bursitis.  6. Status post right medial collateral ligament elbow reconstruction.  7. Gastroesophageal reflux.   DISCHARGE MEDICATIONS:  1. Humalog mixed insulin, 28 units subcutaneously before breakfast, 14 units     subcutaneously before supper.  2. Vicodin 1-2 every six hours as needed for shoulder pain.  3. Protonix 40 mg daily for reflux.  4. Klonopin 0.5 mg, 1/2 to 1 p.o. b.i.d. for anxiety.  5. Wellbutrin XL 300 mg daily.  6. Effexor 75 mg daily.   The patient was already out of work due to elbow problem for which he had  recently been under the treatment of Dr. Fredna Dow.  The patient was advised to  call for appointment to see Dr. Renold Genta one week after discharge and was to  see Dr. Dwyane Dee two weeks after discharge.  The patient was to check Accu-Chek  before meals and at bedtime, four times a day, and to keep a record which he  would bring to the office.   BRIEF HISTORY:  This 55 year old white male, with a history of mild glucose  intolerance - previously diet controlled, came to the office on September 04, 2003, for evaluation of urinary frequency, malaise, and fatigue, along with  blurry vision.  His urinalysis had 3+ glucose and 3+ ketones, and his Accu-  Chek, in the office, was 316.  He said he was fasting, so I thought the  ketones were due perhaps to fasting, and he was begun on Glucotrol XL 5 mg  daily, but his  Accu-Chek actually got worse as an outpatient.  His highest  value was 528 on April 27th.  On the day of admission, his Accu-Chek was  342, and he was noted to have 3+ ketones and 3+ glucose in his urine, after  eating toast and jelly for breakfast.  He had gone to the diabetes treatment  center last year for nutritional counseling.  Glycohemoglobin, in October  2004, was 7%.  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 Lucina Mellow. [name]).  He had an umbilical hernia repair in  November 2004, and in February 2005, he had medial lateral ligament  reconstruction of his right elbow by Dr. Fredna Dow.  He also  had a recent MRI of  his right shoulder to rule out rotator cuff tear.  No tear was noted, but he  was diagnosed with bursitis and the shoulder was injected with Celestone,  Xylocaine, and Marcaine.  It is plausible that the steroid injection  aggravated his hyperglycemia.  He is now admitted for IV fluids and  management of florid diabetes mellitus with ketosis.  He has lost 8 pounds  since January 2005.   He has no known drug allergies.   CURRENT MEDICATIONS:  1. Bextra 10 mg daily but that has been discontinued recently because it was     taken off the market.  2. He has been on Glucotrol XL 5 mg daily since April 25th.  3. Wellbutrin XL 300 mg daily for a history of anxiety depression.  4. Protonix 40 mg daily for GE reflux.  5. Effexor 75 mg XR daily for anxiety depression.   PAST MEDICAL HISTORY:  1. History of pneumonia in January 1995 and February 1999.  2. He has been depressed since September 2003.  3. He had a deviated septum repair twice, at ages 38 and 58.   Tetanus immunization:  Up to date in 1996.  He was given Pneumovax  immunization in 1999.   SOCIAL HISTORY:  He quit smoking two years prior to admission, i.e., April  2003, after smoking for 20 years.  He is  single, never been married.  Drinks  one beer daily.  His sister has a history of depression.  He has been a  Nature conservation officer at [employer] and formerly worked as a foreign Administrator, sports.  He does not like his job and describes it as very stressful.   FAMILY HISTORY:  Father living age 9.  Mother died accidentally with carbon  monoxide poisoning at age 50.   PHYSICAL EXAMINATION:  VITAL SIGNS:  Temperature 98 degrees orally, pulse  70, blood pressure 118/80, weight 194 pounds.  SKIN:  Pale, warm and dry.  NODES:  None.  HEENT:  Head is normocephalic, atraumatic.  Sclerae and conjunctivae are  clear.  Pharynx is clear.  TMs are clear.  NECK:  Supple.  No thyromegaly.  CHEST:  Clear.  CARDIAC:  Regular rate and rhythm.  Normal S1 and S2 without murmurs, rubs  or gallops.  ABDOMEN:  No organomegaly, masses, or tenderness.  EXTREMITIES:  No edema.   The patient was admitted to a regular medical floor and was given vigorous  IV hydration consisting of 1 liter of normal saline IV over one hour and 10  units of regular insulin IV.  He was then started on IV fluids consisting of  normal saline with 30 mEq of potassium chloride per liter at 200 cc/hr.  Accu-Chek was checked every two hours for eight hours, and he was placed on  a sliding scale regimen.  Chest x-ray was obtained and was negative.  He had  diabetic teaching by the nursing staff and a nutritionist did come to see  him.  He was seen in consultation by Dr. Elayne Snare, endocrinologist.  I  started him, the day following admission, on Humulin N 20 units subcu before  breakfast and Humulin N 7 units before supper.  Sliding scale was continued.  IV fluids were decreased, the day after admission, to 150 cc/hr.  Dr. Dwyane Dee  put him on 10 units of Humulin N h.s. instead of before supper and put him  on 24 units of Humulin N and 6  units of Novolog before breakfast.  He was also placed on 6 units of Novolog before supper daily.  He was  given Vicodin  for shoulder pain and Ambien to sleep.  He was given Xanax for anxiety.  The  patient expressed some anxiety and concern about whether or not he could  actually mix two types of insulin.  Subsequently, he was placed on a Humalog  mix insulin preparation 28 units before breakfast and 14 units before  supper.  His Accu-Chek improved considerably and rapidly.  He did well in  the hospital.   He was instructed in a diabetic diet, 1800 calories, although it was  anticipated he would need more calories when he returned to work, perhaps  2,000 to 2200 per day, depending on his activity at work.  It is anticipated  he was going to be out of work at least two more weeks with regard to his  elbow problem.  This will give him some time to adjust to this insulin  regimen.   He was discharged home in stable condition on May 2.   Accu-Chek had gotten down to the low to mid 100s by that time.  Maximum Accu-  Chek, on the day of admission, was 430.  He made considerable progress in a  short period of time and will be followed up closely as an outpatient.                                                [name]. Renold Genta, M.D.    MJB/MEDQ  D:  10/26/2003  T:  10/27/2003  Job:  [id number]   cc:   Elayne Snare, M.D.  [id number] [place] [address]  Fax: [phone]   Daryll Brod, M.D.  [address]  [place]  [place] [address]  Fax: [place] [name], M.D.  [place] [place]  [place] [address]  Fax: [phone]

## 2010-09-27 NOTE — Discharge Summary (Signed)
NAME:  Isaac [name], Isaac [name]                      ACCOUNT NO.:  [id number]   MEDICAL RECORD NO.:  [id number]                   PATIENT TYPE:  INP   LOCATION:  [id number]                                 FACILITY:  [facility]   PHYSICIAN:  [name], M.D.                 DATE OF BIRTH:  [birth year]   DATE OF ADMISSION:  11/02/2003  DATE OF DISCHARGE:  11/05/2003                           DISCHARGE SUMMARY - REFERRING   DISCHARGE DIAGNOSES:  1. Non-ST-segment-elevated myocardial infarction.  2. Diabetes mellitus, insulin dependent.  3. Leukopenia.  4. Dyslipidemia.  5. History of bladder cancer status post chemotherapy.   HOSPITAL COURSE:  [name] is a 55 year old white male with no known  history of coronary artery disease but with significant cardiac risk factors  including diabetes mellitus, insulin dependent, as well as former tobacco  abuse.  He was awakened on the morning of admission with substernal chest  pain that radiated to his jaw associated with shortness of breath,  diaphoresis, and nausea.  He did finally admit to chest pain spells at work  after waking for approximately a week.  The patient underwent cardiac  catheterization the following day and found that the small ramus branch was  occluded in the proximal segment.  The other vessels revealed no  angiographically evident coronary artery disease.  His EF was 60-65% with no  segmental wall motion abnormalities.  The patient remained in the hospital  until November 05, 2003 and was discharged to home in stable condition.   His EKG revealed sinus bradycardia, rate 59, with no acute ST-T wave  changes.  Laboratory studies during this hospital stay include a white count  of 3.9, hematocrit 45, hemoglobin 15.3, platelets 226.  Sodium 140,  potassium 3.9, BUN 21, creatinine 1.1.  Maximum CK was 154 with a 13.5 MB  fraction, maximum troponin was 2.22.  Total cholesterol 129, triglycerides  180, HDL 24, LDL 69.  TSH 0.466.   The  patient was discharged to home in stable condition on the following  medications:  1. Lipitor 40 mg q.h.s.  2. Enteric-coated aspirin 325 mg a day.  3. Protonix 40 mg a day.  4. Klonopin as needed.  5. Effexor 75 mg a day.  6. Diabetes medicines plus insulin as prior to admission.  7. Wellbutrin XL 300 mg a day.  8. Imdur 30 mg a day.  9. Lopressor 25 mg b.i.d.  10.      Plavix 75 mg a day.  11.      Sublingual nitroglycerin p.r.n. chest pain.   No lifting or straining, remain out of work until seen in the office.  Remain on low fat diet as well as a diabetic diet.  Clean over  catheterization site with soap and water, no scrubbing.  Call office if any  problems at [phone].  Dr. Arlina Robes office with call with a follow-up  appointment for him to see the patient  in 1-2 weeks.      Joesphine Bare, P.A. [facility]                      [name], M.D.    LB/MEDQ  D:  12/25/2003  T:  12/25/2003  Job:  [id number]   cc:   [name]. Renold Genta, M.D.  [address]  [place]  [place] [address]  Fax: [phone]   Ernestine Mcmurray, M.D. [facility]

## 2010-09-27 NOTE — Op Note (Signed)
NAME:  Isaac [name], Isaac [name]                      ACCOUNT NO.:  [id number]   MEDICAL RECORD NO.:  [id number]                   PATIENT TYPE:  AMB   LOCATION:  [place]                                  FACILITY:  [facility]   PHYSICIAN:  Daryll Brod, M.D.                    DATE OF BIRTH:  [birth year]   DATE OF PROCEDURE:  07/07/2003  DATE OF DISCHARGE:                                 OPERATIVE REPORT   PREOPERATIVE DIAGNOSES:  Rupture ulnar collateral ligament right elbow.   POSTOPERATIVE DIAGNOSES:  Rupture ulnar collateral ligament right elbow.   OPERATION:  Arthroscopy with debridement right elbow with open  reconstruction of ulnar collateral ligament using palmaris longus graft  right elbow.   SURGEON:  Daryll Brod, M.D.   ASSISTANT:  Youlanda Mighty. Sypher, M.D.   ANESTHESIA:  General.   HISTORY:  The patient is a 55 year old male who suffered an injury to his  right elbow. This was treated conservatively.  He, however, has had  continued pain and discomfort.  X-rays reveal a probable loose body within  the joint.  MRI reveals rupture of the ulnar collateral ligament, injury to  the radial side of his joint with possible rupture of the radial collateral  ligament and avulsion of flexor pronator mass and extensor origin.   DESCRIPTION OF PROCEDURE:  The patient was brought to the operating room  where a general anesthetic was carried out without difficulty.  It was  placed in the lateral decubitus position with the arm for placement of elbow  arthroscopy.  A beanbag was used, he was prepped using duraprep in that  position.  After prepping and draping, the portals were opened after  inflation of the joint with 30 mm of saline through the posterior radial  capitellar area. The joint was entered from the medial portal. This was  palpated approximately 2 cm proximal to the epicondyle, an incision made  longitudinally, the intramuscular septum identified to the anterior aspect  of the elbow. The  scope was inserted. The joint was inspected, the radial  capitellar joint showed no significant changes, very significant synovitis  was present across the joint. A large plica was present in the anterior  aspect with a portion of bony material centrally. A radial portal was then  opened using a switching stick passing this just anterior proximal to the  radial head. An incision was made, this was then used to inspect the ulnar  side of the joint. A significant opening was present to stressing in 30  degrees. This was also noted prior to the procedure when examination under  anesthesia revealed significant opening and subluxation of the entire joint.  The lateral side appeared stable. A debridement was then performed with a  shaver and grasper removing the loose material and the plica. The posterior  joint was then inspected, a compression area on the posterior aspect of the  capitellum was  immediately apparent with loss of cartilage. The ulnohumeral  joint showed no degenerative changes. The posterior fossa showed no loose  bodies. The arthroscopy was then terminated. The patient was reprepped and  redraped in the supine position with the right arm free, the limb was  exsanguinated with an esmarch bandage, tourniquet placed high and the arm  was inflated to 240 mmHg. A medial incision was made over the medial  epicondyle, carried down through subcutaneous tissue. The medial  antebrachial cutaneous nerve of the arm was identified and protected,  significant scarring of the entire flexor pronator mass with avulsion was  present.  The ulnar nerve was identified and protected throughout the  procedure. The flexor pronator muscle mass was then split, this was found to  be entirely avulsed with significant scar about the entire area. This was  dissected down to the medial collateral ligament which was entirely ruptured  and unable to be identified.  Again significant opening of the joint was   immediately apparent.  Drill holes were then placed in the coronoid and into  the medial epicondyle where the remnants of collateral ligament were  present. The palmaris longus was then harvested through a transverse  incision at the radius. A Brand tendon stripper was then used to remove  this.  This was done and the wound closed.  The tendon was put aside,  debrided of muscle.  The tendon was then triple folded and woven together  with a #2 FiberWire. This was done in a Krakow-type stitch incorporating all  three limbs. This was done proximally and distally. Drill holes were then  placed for placement of an AccuMed interference button. A 4 mm drill hole  was placed, the loop placed around the base of the graft and this was placed  into the hole. A 15 mm long 4.7 mm button was then selected and this was  then placed into the proximal hole and immediately pulled out. A second 5 mm  button was then inserted and this also pulled out. It was decided to proceed  with drilling of the opposite cortex using this as a bony bridge to suture  the sutures from the graft in over the ulna proximally.  This was done  through a separate incision after drilling Lanny Hurst needles through the  opposite cortex of the olecranon.  This was sutured firmly fixing the graft  distally. The graft was then placed proximally, the drill holes placed  through the entire epicondyle. It was decided not to proceed with the  interference screw on that side.  The ulnar nerve was protected, the graft  was then passed through this using the FiberWire to suture around the  epicondyle beneath the musculature. This firmly fixed the graft in position  with the elbow placed through a full range of motion to be certain that it  was taut in all positions without excessive pulling or stretching and any  giving.  It appeared to be symmetric. This was then fully sutured and closed onto itself with the sutures passing around the epicondyle  into the graft  proximally and sutured into position. This firmly fixed the graft into  position, the wound was then copiously irrigated with saline.  The remainder  of the collateral ligament was then repaired over the graft using 2-0  FiberWire. The medial musculature was closed with figure-of-eight 4-0 Vicryl  sutures, the subcutaneous tissue with 4-0 Vicryl and the skin was closed  with subcuticular 2-0 Prolene sutures.  A  sterile compressive dressing, long  arm splint maintaining no ulnar stress forces was applied. The patient  tolerated the procedure well and was taken to the recovery room for  observation in satisfactory condition. He is admitted for overnight stay, he  will be discharged on Percocet and Keflex to return to the office in one  week.                                               Daryll Brod, M.D.    Aretta Nip  D:  07/07/2003  T:  07/08/2003  Job:  [id number]   cc:   Youlanda Mighty. Luisa Dago., M.D.  [address]  [place]  [place] [address]  Fax: [phone]

## 2010-09-27 NOTE — Assessment & Plan Note (Signed)
[facility]                            CARDIOLOGY OFFICE NOTE   NAME:OREILLYPrecious, Derman                    MRN:          [id number]  DATE:08/27/2006                            DOB:          Sep 21, 1955    IDENTIFICATION:  [name] is a 55 year old gentleman with coronary  artery disease. I last saw him in cardiology clinic back in August. He  is status post myocardial infarction in 2005 with an occluded ramus  branch. Also with history of hypertension, dyslipidemia, and recently  diagnosed recurrent bladder cancer. He had undergone BCG therapy for  this.   [name] called once while he was undergoing therapy, he had had  some increased chest discomfort. I recommended going up on his Norvasc  as I did not think intervention during the time of his chemo was a good  idea.   He has __________ he has a lot of complaints today. His chest pressure  may be some better. He does note his blood pressure goes up without much  exertion, actually is quite labile 110s to 160s, appetite is gone, he  has had extreme temperature changes, has complaints of headaches. He is  due to be seen by Dr. [name] on May 5.   CURRENT MEDICATIONS:  Include;  1. Wellbutrin XL 300 daily.  2. Effexor 75 daily.  3. Aspirin 325 daily.  4. Protonix 40 daily.  5. Actos 45 daily.  6. Toprol XL 25 daily.  7. Requip 1 mg daily.  8. Januvia 100 daily.  9. Imdur 90 daily.  10.CPAP nightly.  11.Crestor 10 daily.  12.Norvasc 5 b.i.d.  13.__________ 5 b.i.d.  14.Niaspan 1 gram daily.  15.Metformin 1 gram q.a.m. 500 mg q.p.m.   PHYSICAL EXAMINATION:  Patient is in no distress. Blood pressure 118/73,  pulse is 71 and regular, weight 206.  NECK: JVP is normal.  LUNGS: Clear.  CARDIAC EXAM: Regular rate and rhythm. S1, S2, no S3.  ABDOMEN: Benign.  EXTREMITIES: No edema.   IMPRESSION:  1. Coronary artery disease, I am not sure if the patient's pain was      indeed cardiac  ischemia, with all  his other symptoms of hot and      heat intolerance, headaches, I told him to back down on his Norvasc      to 5 daily and we will follow. He will call in a couple of weeks.      Also to cut back on his Niaspan, __________ he may be getting some      flushing, cut back to 500 daily.  2. Dyslipidemia, as noted above. Continue Niaspan and Crestor. Cut      down Niaspan dose.  3. Hypertension, labile, adequate today. We will need to follow again      cutting back on Norvasc some.   I would like to see the patient back in a couple of months. He will call  in a couple of weeks.     [name], MD, [facility]  Electronically Signed    PVR/MedQ  DD: 08/27/2006  DT: 08/28/2006  Job #: [id number]  cc:   Lucina Mellow. [name], M.D.  [name]. Renold Genta, M.D.

## 2010-09-27 NOTE — Assessment & Plan Note (Signed)
[facility] OFFICE NOTE   NAME:OREILLYObryan, Rosberg                    MRN:          [id number]  DATE:12/19/2005                            DOB:          [birth year]    Mr. Pernell Dupre is a 55 year old gentleman whom I last saw in clinic back in  May.  He has a history of coronary artery disease (status post MI in June  2005 with an occluded ramus branch), hypertension, dyslipidemia.   When I saw him last, he had some chest pressure, question vasospasm,  question small vessel disease, and I recommended continuing his current  regimen.   Since then he has done fairly well.  He is under increased stress at his job  and says really his only chest pain comes when he gets really excited or  upset.  Blood pressure also when he is upset can go up into the 150/99 range  at maximum.  He notes occasional dizziness with bending.   Otherwise when not stressed, doing well.   CURRENT MEDICATIONS:  1. Wellbutrin XL 300 mg daily.  2. Effexor XR 75 mg daily.  3. Aspirin 325 mg daily.  4. Protonix 40 mg daily.  5. Tricor 145 mg daily.  6. Actos 45 mg daily.  7. Toprol XL 25 mg daily.  8. Requip one daily.  9. Lipitor 20 mg daily.  10.Norvasc 5 mg daily.  11.Trazodone 50 mg daily.  12.Genuvia 100 mg daily.  13.Imdur 90 mg daily.  14.CPAP at night.  15.Metformin ER 500 mg b.i.d.   PHYSICAL EXAMINATION:  GENERAL:  The patient is in no distress.  VITAL SIGNS:  Blood pressure is 112/72, pulse is 68 and regular.  Weight  201, down from 207 last visit.  LUNGS:  Clear.  CARDIAC:  Regular rate and rhythm, S1, S2, no S3, no murmurs.  ABDOMEN:  Benign.  EXTREMITIES:  No edema.   IMPRESSION:  1. Coronary artery disease.  Clinically doing well.  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  Again, keep on current regimen.  2. Hypertension.  Adequate control when not stressed.  3. Dyslipidemia.  His last lipid panel, I would like to get a better      improvement in his HDL today and I have switched him today and given      him a 54-month supply of Crestor.  He should take 10 mg a day and will      follow up with a Lipomed panel and see if we can improve the HDL from      31, it had been 23 at a nadir.  I will be in touch with him regarding      the blood work and how to proceed with his lipids.  If indeed we keep      him on Lipitor, we could go to combo Caduet to conserve costs.  4. Otherwise, follow-up in 6 months, sooner if problems develop.  I      recommended he set up follow-up with Kathee Delton, MD.  He is noting      some problems with his CPAP.                                [name], MD, [facility]    PVR/MedQ  DD:  12/19/2005  DT:  12/20/2005  Job #:  [id number]   cc:   Kathee Delton, MD, [facility]  [name]. Renold Genta, MD

## 2010-09-27 NOTE — Procedures (Signed)
NAME:  Isaac [name], Isaac [name]          ACCOUNT NO.:  [id number]   MEDICAL RECORD NO.:  [id number]          PATIENT TYPE:  OUT   LOCATION:  SLEEP CENTER                 FACILITY:  [facility]   PHYSICIAN:  Danton Sewer, M.D. [facility] DATE OF BIRTH:  [birth year]   DATE OF ADMISSION:  11/26/2003  DATE OF DISCHARGE:  11/26/2003                              NOCTURNAL POLYSOMNOGRAM   REFERRING PHYSICIAN:  Dr. [name]   INDICATION FOR STUDY/HISTORY OF PRESENT ILLNESS:  Hypersomnia with sleep  apnea.   SLEEP ARCHITECTURE:  The patient had total sleep time of 403 minutes with  large amounts of REM and very little slow wave sleep.  Sleep onset latency  was prolonged at 49 minutes and REM latency was normal.   IMPRESSION:  1. Split night study reveals severe obstructive sleep apnea during the first     half of the study, with 148 obstructive events in the first 101 minutes     of sleep.  This gave the patient a respiratory disturbance index of 88     events per hour with moderate O2 desaturation as low as 75%.  There was     moderate snoring during this time.  By protocol, the patient was then     placed on a large Respironics full face mask and CPAP was initiated.  At     a pressure of 5 both snoring and obstructive events were eliminated.  The     patient had a few scattered central events that are not considered     significant.  The patient had excellent control of his sleep apnea even     in supine REM on the 5 cm of pressure.  2. No clinically significant cardiac arrhythmias.  3. Large numbers of leg jerks with minimal sleep disruption.                                   ______________________________                                Danton Sewer, M.D. [facility]     KC/MEDQ  D:  12/04/2003 10:21:03  T:  12/04/2003 12:40:27  Job:  [id number]

## 2010-09-27 NOTE — Op Note (Signed)
NAMEGREYSYN, Isaac [name]             ACCOUNT NO.:  [id number]   MEDICAL RECORD NO.:  [id number]          PATIENT TYPE:  AMB   LOCATION:  [facility]                         FACILITY:  [facility]   PHYSICIAN:  Lucina Mellow. [name], M.D.DATE OF BIRTH:  July 04, 1955   DATE OF PROCEDURE:  06/01/2006  DATE OF DISCHARGE:                               OPERATIVE REPORT   PREOPERATIVE DIAGNOSIS:  Rule out recurrent bladder carcinoma.   POSTOPERATIVE DIAGNOSIS:  Rule out recurrent bladder carcinoma.   PROCEDURE:  Cystoscopy and bladder biopsy and fulguration.   SURGEON:  Dr. Hessie Diener.   ANESTHESIA:  General.   INDICATIONS:  This 55 year old gentleman had bladder carcinoma resected  in January 2005 followed by BCG therapy.  He had a papillary lesion at  cystoscopy the other day and he is brought to the OR today for bladder  biopsy.   DESCRIPTION OF PROCEDURE:  The patient was brought to the operating room  and placed in lithotomy position. The external genitalia were prepped  and draped in the usual fashion.  He was cystoscoped, the anterior and  prostatic urethras were unremarkable.  The only lesion on the bladder  was a slightly raised papillary lesion 1/2 to 1 cm in size on the  trigone.  The rest of the bladder was completely normal.  Cold cup  bladder biopsy forceps were used to biopsy this lesion on the trigone  and the rest of the lesion was destroyed with the Bugbee electrode.  At  this point, I  recystoscoped and there was no evidence of any residual  abnormal mucosa. The bladder was emptied, scope removed and the patient  sent to the recovery room in good condition having tolerated the  procedure well.      Lucina Mellow. [name], M.D.  Electronically Signed     LJP/MEDQ  D:  06/01/2006  T:  06/01/2006  Job:  [id number]

## 2010-12-27 ENCOUNTER — Telehealth: Payer: Self-pay | Admitting: Pulmonary Disease

## 2010-12-27 DIAGNOSIS — G4733 Obstructive sleep apnea (adult) (pediatric): Secondary | ICD-10-CM

## 2010-12-27 NOTE — Telephone Encounter (Signed)
Ok with me 

## 2010-12-27 NOTE — Telephone Encounter (Signed)
Dr. Gwenette Greet, pt is requesting an order to be sent to [facility] for a hose called Climate Line 2. He states this will help with the dryness.  Is this ok?

## 2010-12-27 NOTE — Telephone Encounter (Signed)
Order placed -- lmomtcb to inform pt. 

## 2010-12-30 NOTE — Telephone Encounter (Signed)
Called, spoke with pt.  He is aware order placed.  He verbalized understanding of this and voiced no further questions/concerns at this time.

## 2011-01-22 ENCOUNTER — Encounter: Payer: Self-pay | Admitting: Internal Medicine

## 2011-01-24 ENCOUNTER — Telehealth: Payer: Self-pay | Admitting: Internal Medicine

## 2011-01-24 NOTE — Telephone Encounter (Signed)
Called patient to schedule appt with Dr. Renold Genta.  Pt is being followed by Dr. Harrington Challenger for his lipids and Dr. Harrington Challenger prescribes Crestor.  Pt decided he would contact Dr. Harrington Challenger about labs and follow up there.  He will call back for an appt with Dr. Renold Genta if Dr. Harrington Challenger prefers Dr. Renold Genta handle.

## 2011-01-24 NOTE — Telephone Encounter (Signed)
Called patient back. He advised me that Dr.Kumar's office told him that he had a bump in his liver enzymes. Advised him to have the labs faxed to Dr.Ross so she can evaluate his Crestor therapy. Has appointment at the end of the month.

## 2011-01-24 NOTE — Telephone Encounter (Signed)
Pt calling to schedule appt w/ Dr. Harrington Challenger. Pt endocrinologists Dr. Dwyane Dee got blood work on pt recently and results showed decline liver function and explained it could be caused by pt taking crestor. Pt scheduled appt w/ Dr. Harrington Challenger 9/28 but in the meantime wants to know if he should stop taking his crestor. Please return pt call to discuss further.

## 2011-01-24 NOTE — Telephone Encounter (Signed)
Lab results shown to Dr. Renold Genta.  She advised me to schedule pt appt to see her next week.  Pt called & appt scheduled.

## 2011-02-04 LAB — BASIC METABOLIC PANEL
CO2: 27
Calcium: 9.5
Creatinine, Ser: 0.97
GFR calc Af Amer: >60
GFR calc non Af Amer: >60

## 2011-02-06 ENCOUNTER — Encounter: Payer: Self-pay | Admitting: Internal Medicine

## 2011-02-07 ENCOUNTER — Ambulatory Visit (INDEPENDENT_AMBULATORY_CARE_PROVIDER_SITE_OTHER): Payer: Medicare Other | Admitting: Internal Medicine

## 2011-02-07 ENCOUNTER — Encounter: Payer: Self-pay | Admitting: Internal Medicine

## 2011-02-07 DIAGNOSIS — R748 Abnormal levels of other serum enzymes: Secondary | ICD-10-CM

## 2011-02-07 DIAGNOSIS — E785 Hyperlipidemia, unspecified: Secondary | ICD-10-CM

## 2011-02-07 DIAGNOSIS — I10 Essential (primary) hypertension: Secondary | ICD-10-CM

## 2011-02-07 DIAGNOSIS — R7401 Elevation of levels of liver transaminase levels: Secondary | ICD-10-CM

## 2011-02-07 DIAGNOSIS — I2510 Atherosclerotic heart disease of native coronary artery without angina pectoris: Secondary | ICD-10-CM

## 2011-02-07 LAB — HEPATIC FUNCTION PANEL
ALT: 115 U/L — ABNORMAL HIGH (ref 0–53)
AST: 72 U/L — ABNORMAL HIGH (ref 0–37)
Total Bilirubin: 0.7 mg/dL (ref 0.3–1.2)
Total Protein: 6.8 g/dL (ref 6.0–8.3)

## 2011-02-07 MED ORDER — METOPROLOL SUCCINATE ER 25 MG PO TB24: 25.0000 mg | ORAL_TABLET | Freq: Every day | ORAL | Status: DC

## 2011-02-07 NOTE — Patient Instructions (Signed)
Lab work today. We will call you with results.  Your physician wants you to follow-up in:12 months with Dr.Ross You will receive a reminder letter in the mail two months in advance. If you don't receive a letter, please call our office to schedule the follow-up appointment.

## 2011-02-07 NOTE — Progress Notes (Signed)
Isaac [name] is a 55 year old with a history of mild CAD and probable microvascular dz with TIMII II flow in LAD and LCx.  Also a history of DM, dyslipidemia, HTN and bladder CA.  I las saw him last winter. Since Seen has been seen by K. Clance. Chest seems better   Not as many chest pains.  Trying to avoid stress.   Occasionally SOB.  With and without  Activity Sometimes with mowing lawn lawn with have chest pain will have SOB and chest pain Had LFTs checked and they were increased.  Told to stop crestor.  Does note trip to [facility]  Late summer.  Ate a lot of seafood, all cooked though.. Allergies not on file  Current Outpatient Prescriptions  Medication Sig Dispense Refill  . amLODipine (NORVASC) 5 MG tablet Take 5 mg by mouth daily.        Marland Kitchen aspirin 325 MG tablet Take 325 mg by mouth daily.        . Calcium-Magnesium-Vitamin D 185-50-100 MG-MG-UNIT CAPS Take by mouth.        Marland Kitchen glimepiride (AMARYL) 1 MG tablet Take 1 mg by mouth daily before breakfast.        . isosorbide mononitrate (IMDUR) 120 MG 24 hr tablet TAKE 1 TABLET DAILY  30 tablet  2  . Liraglutide (VICTOZA Spry) Inject 0.6 mg into the skin daily.        . milk thistle 175 MG tablet Take 175 mg by mouth daily.        . Multiple Vitamins-Minerals (MULTIVITAMIN WITH MINERALS) tablet Take 1 tablet by mouth daily.        . pantoprazole (PROTONIX) 40 MG tablet       . rOPINIRole (REQUIP) 1 MG tablet Take 1 mg by mouth daily. 1.5 mg daily       . rosuvastatin (CRESTOR) 10 MG tablet Take 5 mg by mouth daily.        . sitaGLIPtan-metformin (JANUMET) 50-1000 MG per tablet Take 1 tablet by mouth 2 (two) times daily with a meal.        . TOPROL XL 25 MG 24 hr tablet         Past Medical History  Diagnosis Date  . Coronary artery disease   . Diabetes mellitus   . Hypertension   . Sleep apnea   . Bladder cancer     followed Dr [name]  . Dyslipidemia     pt unable to tolerate niaspan    Past Surgical History  Procedure Date  .  Cystoscopy     with rumor resections  . Umbillical hernia repair [id number]  . Elbow surgery   . Shoulder surgery     2005    Family History  Problem Relation Age of Onset  . Coronary artery disease Father     History   Social History  . Marital Status: Single    Spouse Name: N/A    Number of Children: N/A  . Years of Education: N/A   Occupational History  . Not on file.   Social History Main Topics  . Smoking status: Former Research scientist (life sciences)  . Smokeless tobacco: Not on file  . Alcohol Use: Not on file  . Drug Use: Not on file  . Sexually Active: Not on file   Other Topics Concern  . Not on file   Social History Narrative  . No narrative on file    Review of Systems:  All systems reviewed.  They are  negative to the above problem except as previously stated.  Vital Signs: BP 130/85  Pulse 77  Ht 5\' 9"  (1.753 m)  Wt 210 lb 12.8 oz (95.618 kg)  BMI 31.13 kg/m2  Physical Exam   Pateint in NAD HEENT:  Normocephalic, atraumatic. EOMI, PERRLA.  Neck: JVP is normal. No thyromegaly. No bruits.  Lungs: clear to auscultation. No rales no wheezes.  Heart: Regular rate and rhythm. Normal S1, S2. No S3.   No significant murmurs. PMI not displaced.  Abdomen:  Supple, nontender. Normal bowel sounds. No masses. No hepatomegaly.  Extremities:   Good distal pulses throughout. No lower extremity edema.  Musculoskeletal :moving all extremities.  Neuro:   alert and oriented x3.  CN II-XII grossly intact.  EKG:  SInus rhythm  78 bpm.   Assessment and Plan:

## 2011-02-10 ENCOUNTER — Other Ambulatory Visit: Payer: Self-pay | Admitting: *Deleted

## 2011-02-10 DIAGNOSIS — R748 Abnormal levels of other serum enzymes: Secondary | ICD-10-CM

## 2011-02-10 LAB — POCT I-STAT 4, (NA,K, GLUC, HGB,HCT)
Hemoglobin: 14.6
Sodium: 138

## 2011-02-11 NOTE — Assessment & Plan Note (Signed)
Continue meds. 

## 2011-02-11 NOTE — Assessment & Plan Note (Signed)
INtermittent chest tightness.  NO worsening.  I would keep on same regimen.

## 2011-02-11 NOTE — Assessment & Plan Note (Signed)
LFTs were increased and Crestor was stopped.  I am not convinced it is the crestor as he has tolerated for a long time. Will recheck LFTs now.

## 2011-02-12 ENCOUNTER — Ambulatory Visit: Payer: Medicare Other | Admitting: *Deleted

## 2011-02-12 DIAGNOSIS — R748 Abnormal levels of other serum enzymes: Secondary | ICD-10-CM

## 2011-02-12 DIAGNOSIS — Z79899 Other long term (current) drug therapy: Secondary | ICD-10-CM

## 2011-02-13 LAB — HEPATITIS PANEL, ACUTE
Hep A IgM: NEGATIVE
Hep B C IgM: NEGATIVE
Hepatitis B Surface Ag: NEGATIVE

## 2011-02-14 LAB — BASIC METABOLIC PANEL
Chloride: 106
GFR calc Af Amer: >60
GFR calc non Af Amer: >60
Potassium: 4.7
Sodium: 138

## 2011-02-14 LAB — CBC
HCT: 46.8
MCV: 95.8
RBC: 4.88
WBC: 4.9

## 2011-02-17 LAB — BASIC METABOLIC PANEL
BUN: 19
CO2: 31
Chloride: 102
Creatinine, Ser: 1.01
Potassium: 4.8

## 2011-02-17 LAB — POCT HEMOGLOBIN-HEMACUE: Hemoglobin: 15.1

## 2011-02-18 ENCOUNTER — Telehealth: Payer: Self-pay | Admitting: Internal Medicine

## 2011-02-18 NOTE — Telephone Encounter (Signed)
Pt returning your call

## 2011-02-18 NOTE — Telephone Encounter (Signed)
Patient is aware of appointment for LFT on 03/06/11.

## 2011-02-18 NOTE — Telephone Encounter (Signed)
Pt called. He wants to know his tests results

## 2011-02-18 NOTE — Telephone Encounter (Signed)
Lm that his liver tests were not repeated. Is schedule 10/25 for repeat liver labs,

## 2011-02-21 LAB — I-STAT 8, (EC8 V) (CONVERTED LAB)
Acid-Base Excess: 1
Chloride: 104
Glucose, Bld: 132 — ABNORMAL HIGH
Hemoglobin: 15
Potassium: 4.3
Sodium: 139
TCO2: 27
pH, Ven: 7.397 — ABNORMAL HIGH

## 2011-03-06 ENCOUNTER — Other Ambulatory Visit (INDEPENDENT_AMBULATORY_CARE_PROVIDER_SITE_OTHER): Payer: Medicare Other | Admitting: *Deleted

## 2011-03-06 DIAGNOSIS — Z79899 Other long term (current) drug therapy: Secondary | ICD-10-CM

## 2011-03-06 DIAGNOSIS — R7401 Elevation of levels of liver transaminase levels: Secondary | ICD-10-CM

## 2011-03-06 DIAGNOSIS — R748 Abnormal levels of other serum enzymes: Secondary | ICD-10-CM

## 2011-03-06 LAB — HEPATIC FUNCTION PANEL: Albumin: 4.2 g/dL (ref 3.5–5.2)

## 2011-03-07 ENCOUNTER — Other Ambulatory Visit: Payer: Self-pay | Admitting: *Deleted

## 2011-03-07 DIAGNOSIS — IMO0001 Reserved for inherently not codable concepts without codable children: Secondary | ICD-10-CM

## 2011-03-07 MED ORDER — PANTOPRAZOLE SODIUM 40 MG PO TBEC: 40.0000 mg | DELAYED_RELEASE_TABLET | Freq: Every day | ORAL | Status: DC

## 2011-03-14 ENCOUNTER — Other Ambulatory Visit: Payer: Self-pay | Admitting: *Deleted

## 2011-03-14 DIAGNOSIS — R748 Abnormal levels of other serum enzymes: Secondary | ICD-10-CM

## 2011-03-18 ENCOUNTER — Encounter: Payer: Self-pay | Admitting: Gastroenterology

## 2011-03-28 ENCOUNTER — Other Ambulatory Visit: Payer: Self-pay | Admitting: Urology

## 2011-03-31 ENCOUNTER — Telehealth: Payer: Self-pay | Admitting: Internal Medicine

## 2011-03-31 ENCOUNTER — Encounter (HOSPITAL_BASED_OUTPATIENT_CLINIC_OR_DEPARTMENT_OTHER): Payer: Self-pay | Admitting: *Deleted

## 2011-03-31 NOTE — Telephone Encounter (Signed)
All Cardiac faxed to [facility] @ [phone]  03/31/11/km

## 2011-03-31 NOTE — Pre-Procedure Instructions (Signed)
Pt instructed npo after MN except imdur,protonix,norvasc w/ sip of water.  To [facility] @ [id number]. Needs [email or web address], cardiac studies requested from .Dr. [name].

## 2011-04-06 NOTE — H&P (Signed)
H&P  Chief Complaint: Here for outpatient endoscopic treatment of recurrent tumor.  History of Present Illness:  [name] returns for follow-up. 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  Prior to that, Dr. 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  He is  status post right ureteral resection with reimplantation due to recurrent transitional cell carcinoma of the bladder and involvement of the distal right ureter (June 2010).  Again, we felt nephroureterectomy would be overaggressive therapy.  The patient had reassessment about 24 months ago (02/2009).  This included a cystogram which showed nice reflux up the right side with no evidence of stricturing, no evidence of filling defects.  We were able to drive the ureteroscope right up the ureter as well and has had no evidence of recurrence.  Urine cytology was unremarkable and there was no evidence of any bladder cancer.  Overall we were quite pleased with things and he has recovered from that procedure very nicely. He continues to do well. Follow up 06/2009 including cystoscopy and NMP-22 was negative.         Follow up 10/2009 cystoscopy and cytology were negative.   Follow up 02/2010 cystoscopy and cytology were negative  CT scan also ok.  [name] status post his most recent cystoscopy with bladder biopsy.     Since my distal ureterectomy with reimplant, he  has had no evidence of recurrences until his follow-up in February of 2012 where we saw some carpeting of papillary tumor.  The patient recently underwent cold-cup biopsy/resection of these areas along with fulguration.  Pathology did indeed confirm this to  be transitional cell carcinoma, but fortunately it was low-grade and noninvasive. He has had 2 induction courses of BCG and did not really tolerate the last one that well.     Past  Medical History  Diagnosis Date  . Hypertension   . Sleep apnea   . Dyslipidemia     pt unable to tolerate niaspan  . Bladder cancer     followed Dr Isaac [name]  . Coronary artery disease     followed by Dr. Dorris [name]  . GERD (gastroesophageal reflux disease)   . Myocardial infarction 6/05    denies s&s  . Diabetes mellitus     followed by Dr. Dwyane [name]   Past Surgical History  Procedure Date  . Cystoscopy 2010      rt uretertal ca w/ removal  . Umbillical hernia repair [id number]  . Elbow surgery   . Shoulder surgery     2005  . Appendectomy   . Abdominal hysterectomy   . Ureterectomy 2010    rt ureterectomy w/ reimplantation for ca    Home Medications:  No prescriptions prior to admission   Allergies: No Known Allergies  Family History  Problem Relation Age of Onset  . Coronary artery disease Father    Social History:  reports that he has quit smoking. He does not have any smokeless tobacco history on file. He reports that he does not drink alcohol. His drug history not on file.  ROS: A complete review of systems was performed.  All systems are negative except for pertinent findings as noted. @ROS @  Physical Exam:  Vital signs in last 24 hours:   General:  Alert and oriented, No acute distress HEENT: Normocephalic, atraumatic Neck: No JVD or lymphadenopathy Cardiovascular: Regular rate  and rhythm Lungs: Clear bilaterally Abdomen: Soft, nontender, nondistended, no abdominal masses. Well healed incision. Back: No CVA tenderness Genitourinary:  Normal male phallus, testes are descended bilaterally and non-tender and without masses, scrotum is normal in appearance without lesions or masses, perineum is normal on inspection. Rectal: Normal sphincter tone, no rectal masses, prostate is non tender and without nodularity. Prostate size is estimated to be 30 cc Extremities: No edema Neurologic: Grossly intact  Laboratory Data:  No results found for this or any previous visit  (from the past 24 hour(s)). No results found for this or any previous visit (from the past 240 hour(s)). Creatinine: No results found for this basename: CREATININE:7 in the last 168 hours  Impression/Assessment:   On cystoscopy [name] really has a very small area of what appears to be low-grade papillary tumor recurrence. This is up on the start of the posterior wall of his bladder on the left side. This is again, highly less likely to be transitional cell carcinoma but is likely also to be low grade and noninvasive. This area is small enough that one could consider in-office fulguration but is not comfortable with this and has a fair amount of discomfort with cystoscopy. In addition, if we put him to sleep we would more effectively be able to do a cystogram and also a ureteroscopy to take a good look at that right collecting system where he has had his ureteral tumor in the past and required reconstructive surgery. There is certainly no urgent need to do this and we will try to get this set up for some time in the next several weeks.   Plan: Here today for cystoscopy with Bx/ fulgeration, cystogram and ureteroscopy.  Isaac [name] S 04/06/2011, 6:20 PM

## 2011-04-07 ENCOUNTER — Ambulatory Visit (HOSPITAL_BASED_OUTPATIENT_CLINIC_OR_DEPARTMENT_OTHER)
Admission: RE | Admit: 2011-04-07 | Discharge: 2011-04-07 | Disposition: A | Discharge status: Home / Self Care | Payer: Medicare Other | Source: Ambulatory Visit | Attending: Urology | Admitting: Urology

## 2011-04-07 ENCOUNTER — Encounter (HOSPITAL_BASED_OUTPATIENT_CLINIC_OR_DEPARTMENT_OTHER)
Admission: RE | Disposition: A | Discharge status: Home / Self Care | Payer: Self-pay | Source: Ambulatory Visit | Attending: Urology

## 2011-04-07 ENCOUNTER — Encounter (HOSPITAL_BASED_OUTPATIENT_CLINIC_OR_DEPARTMENT_OTHER): Payer: Self-pay | Admitting: Anesthesiology

## 2011-04-07 ENCOUNTER — Other Ambulatory Visit: Payer: Self-pay | Admitting: Urology

## 2011-04-07 ENCOUNTER — Ambulatory Visit (HOSPITAL_BASED_OUTPATIENT_CLINIC_OR_DEPARTMENT_OTHER): Payer: Medicare Other | Admitting: Anesthesiology

## 2011-04-07 DIAGNOSIS — E119 Type 2 diabetes mellitus without complications: Secondary | ICD-10-CM | POA: Insufficient documentation

## 2011-04-07 DIAGNOSIS — Z8559 Personal history of malignant neoplasm of other urinary tract organ: Secondary | ICD-10-CM | POA: Insufficient documentation

## 2011-04-07 DIAGNOSIS — C670 Malignant neoplasm of trigone of bladder: Secondary | ICD-10-CM | POA: Insufficient documentation

## 2011-04-07 DIAGNOSIS — I252 Old myocardial infarction: Secondary | ICD-10-CM | POA: Insufficient documentation

## 2011-04-07 DIAGNOSIS — G4730 Sleep apnea, unspecified: Secondary | ICD-10-CM | POA: Insufficient documentation

## 2011-04-07 DIAGNOSIS — C674 Malignant neoplasm of posterior wall of bladder: Secondary | ICD-10-CM | POA: Insufficient documentation

## 2011-04-07 DIAGNOSIS — I10 Essential (primary) hypertension: Secondary | ICD-10-CM | POA: Insufficient documentation

## 2011-04-07 DIAGNOSIS — K219 Gastro-esophageal reflux disease without esophagitis: Secondary | ICD-10-CM | POA: Insufficient documentation

## 2011-04-07 DIAGNOSIS — E785 Hyperlipidemia, unspecified: Secondary | ICD-10-CM | POA: Insufficient documentation

## 2011-04-07 DIAGNOSIS — I2510 Atherosclerotic heart disease of native coronary artery without angina pectoris: Secondary | ICD-10-CM | POA: Insufficient documentation

## 2011-04-07 HISTORY — DX: Gastro-esophageal reflux disease without esophagitis: K21.9

## 2011-04-07 HISTORY — PX: CYSTOSCOPY WITH BIOPSY: SHX5122

## 2011-04-07 LAB — POCT I-STAT 4, (NA,K, GLUC, HGB,HCT)
Glucose, Bld: 106 mg/dL — ABNORMAL HIGH (ref 70–99)
Potassium: 4 mEq/L (ref 3.5–5.1)
Sodium: 141 mEq/L (ref 135–145)

## 2011-04-07 LAB — GLUCOSE, CAPILLARY: Glucose-Capillary: 120 mg/dL — ABNORMAL HIGH (ref 70–99)

## 2011-04-07 SURGERY — CYSTOSCOPY, WITH BIOPSY
Anesthesia: General | Site: Ureter | Laterality: Right | Wound class: Clean Contaminated

## 2011-04-07 MED ORDER — LIDOCAINE HCL 2 % EX GEL
CUTANEOUS | Status: DC | PRN
  Administered 2011-04-07: 1

## 2011-04-07 MED ORDER — LACTATED RINGERS IV SOLN
INTRAVENOUS | Status: DC
  Administered 2011-04-07: 10:00:00 via INTRAVENOUS
  Administered 2011-04-07: 125 mL/h via INTRAVENOUS

## 2011-04-07 MED ORDER — MITOMYCIN CHEMO FOR BLADDER INSTILLATION 40 MG
40.0000 mg | Freq: Once | INTRAVENOUS | Status: AC
  Administered 2011-04-07: 40 mg via INTRAVESICAL
  Filled 2011-04-07: qty 40

## 2011-04-07 MED ORDER — SODIUM CHLORIDE 0.9 % IR SOLN
Status: DC | PRN
  Administered 2011-04-07: 3000 mL

## 2011-04-07 MED ORDER — CIPROFLOXACIN IN D5W 400 MG/200ML IV SOLN
400.0000 mg | INTRAVENOUS | Status: AC
  Administered 2011-04-07: 400 mg via INTRAVENOUS

## 2011-04-07 MED ORDER — PHENAZOPYRIDINE HCL 200 MG PO TABS
200.0000 mg | ORAL_TABLET | Freq: Once | ORAL | Status: AC
  Administered 2011-04-07: 200 mg via ORAL

## 2011-04-07 MED ORDER — ONDANSETRON HCL 4 MG/2ML IJ SOLN
INTRAMUSCULAR | Status: DC | PRN
  Administered 2011-04-07: 4 mg via INTRAVENOUS

## 2011-04-07 MED ORDER — OXYCODONE-ACETAMINOPHEN 5-500 MG PO CAPS: 1.0000 | ORAL_CAPSULE | ORAL | Status: AC | PRN

## 2011-04-07 MED ORDER — MIDAZOLAM HCL 5 MG/5ML IJ SOLN
INTRAMUSCULAR | Status: DC | PRN
  Administered 2011-04-07: 2 mg via INTRAVENOUS

## 2011-04-07 MED ORDER — PROMETHAZINE HCL 25 MG/ML IJ SOLN: 6.2500 mg | INTRAMUSCULAR | Status: DC | PRN

## 2011-04-07 MED ORDER — PROPOFOL 10 MG/ML IV EMUL
INTRAVENOUS | Status: DC | PRN
  Administered 2011-04-07: 200 mg via INTRAVENOUS

## 2011-04-07 MED ORDER — FENTANYL CITRATE 0.05 MG/ML IJ SOLN
INTRAMUSCULAR | Status: DC | PRN
  Administered 2011-04-07: 25 ug via INTRAVENOUS
  Administered 2011-04-07 (×2): 50 ug via INTRAVENOUS
  Administered 2011-04-07: 25 ug via INTRAVENOUS

## 2011-04-07 MED ORDER — DIATRIZOATE MEGLUMINE 30 % UR SOLN
URETHRAL | Status: DC | PRN
  Administered 2011-04-07: 300 mL

## 2011-04-07 MED ORDER — LIDOCAINE HCL (CARDIAC) 20 MG/ML IV SOLN
INTRAVENOUS | Status: DC | PRN
  Administered 2011-04-07: 100 mg via INTRAVENOUS

## 2011-04-07 MED ORDER — IOHEXOL 350 MG/ML SOLN
INTRAVENOUS | Status: DC | PRN
  Administered 2011-04-07: 50 mL

## 2011-04-07 MED ORDER — DEXAMETHASONE SODIUM PHOSPHATE 4 MG/ML IJ SOLN
INTRAMUSCULAR | Status: DC | PRN
  Administered 2011-04-07: 8 mg via INTRAVENOUS

## 2011-04-07 MED ORDER — MEPERIDINE HCL 25 MG/ML IJ SOLN: 6.2500 mg | INTRAMUSCULAR | Status: DC | PRN

## 2011-04-07 MED ORDER — FENTANYL CITRATE 0.05 MG/ML IJ SOLN
25.0000 ug | INTRAMUSCULAR | Status: AC | PRN
  Administered 2011-04-07 (×3): 25 ug via INTRAVENOUS
  Administered 2011-04-07 (×2): 50 ug via INTRAVENOUS
  Administered 2011-04-07: 25 ug via INTRAVENOUS

## 2011-04-07 MED ORDER — KETOROLAC TROMETHAMINE 30 MG/ML IJ SOLN: 15.0000 mg | Freq: Once | INTRAMUSCULAR | Status: DC | PRN

## 2011-04-07 MED ORDER — OXYCODONE-ACETAMINOPHEN 5-325 MG PO TABS
1.0000 | ORAL_TABLET | ORAL | Status: DC | PRN
  Administered 2011-04-07: 1 via ORAL

## 2011-04-07 MED ORDER — STERILE WATER FOR IRRIGATION IR SOLN
Status: DC | PRN
  Administered 2011-04-07: 3000 mL

## 2011-04-07 SURGICAL SUPPLY — 41 items
ADAPTER CATH URET PLST 4-6FR (CATHETERS) ×3 IMPLANT
BAG DRAIN URO-CYSTO SKYTR STRL (DRAIN) ×3 IMPLANT
BASKET LASER NITINOL 1.9FR (BASKET) IMPLANT
BASKET STNLS GEMINI 4WIRE 3FR (BASKET) IMPLANT
BASKET ZERO TIP NITINOL 2.4FR (BASKET) IMPLANT
BENZOIN TINCTURE PRP APPL 2/3 (GAUZE/BANDAGES/DRESSINGS) IMPLANT
CANISTER SUCT LVC 12 LTR MEDI- (MISCELLANEOUS) ×3 IMPLANT
CATH FOLEY 2WAY SLVR  5CC 18FR (CATHETERS) ×1
CATH FOLEY 2WAY SLVR 5CC 18FR (CATHETERS) ×2 IMPLANT
CATH ROBINSON RED A/P 16FR (CATHETERS) ×3 IMPLANT
CATH URET 5FR 28IN OPEN ENDED (CATHETERS) ×3 IMPLANT
CLOTH BEACON ORANGE TIMEOUT ST (SAFETY) ×3 IMPLANT
DRAPE CAMERA CLOSED 9X96 (DRAPES) ×3 IMPLANT
ELECT REM PT RETURN 9FT ADLT (ELECTROSURGICAL) ×3
ELECTRODE REM PT RTRN 9FT ADLT (ELECTROSURGICAL) ×2 IMPLANT
GLOVE BIO SURGEON STRL SZ 6.5 (GLOVE) ×3 IMPLANT
GLOVE BIO SURGEON STRL SZ7.5 (GLOVE) ×3 IMPLANT
GLOVE ECLIPSE 6.0 STRL STRAW (GLOVE) ×3 IMPLANT
GOWN BRE IMP SLV AUR LG STRL (GOWN DISPOSABLE) ×3 IMPLANT
GOWN STRL REIN XL XLG (GOWN DISPOSABLE) ×3 IMPLANT
GUIDEWIRE 0.038 PTFE COATED (WIRE) IMPLANT
GUIDEWIRE ANG ZIPWIRE 038X150 (WIRE) IMPLANT
GUIDEWIRE STR DUAL SENSOR (WIRE) ×3 IMPLANT
IV NS IRRIG 3000ML ARTHROMATIC (IV SOLUTION) ×3 IMPLANT
KIT BALLIN UROMAX 15FX10 (LABEL) IMPLANT
KIT BALLN UROMAX 15FX4 (MISCELLANEOUS) IMPLANT
KIT BALLN UROMAX 26 75X4 (MISCELLANEOUS)
LASER FIBER DISP (UROLOGICAL SUPPLIES) IMPLANT
LOOP CUTTING 24FR OLYMPUS (CUTTING LOOP) ×3 IMPLANT
NDL SAFETY ECLIPSE 18X1.5 (NEEDLE) IMPLANT
NEEDLE HYPO 18GX1.5 SHARP (NEEDLE)
NEEDLE HYPO 22GX1.5 SAFETY (NEEDLE) IMPLANT
NS IRRIG 500ML POUR BTL (IV SOLUTION) IMPLANT
PACK CYSTOSCOPY (CUSTOM PROCEDURE TRAY) ×3 IMPLANT
PLUG CATH AND CAP STER (CATHETERS) ×3 IMPLANT
SET HIGH PRES BAL DIL (LABEL)
SHEATH URET ACCESS 12FR/35CM (UROLOGICAL SUPPLIES) IMPLANT
SHEATH URET ACCESS 12FR/55CM (UROLOGICAL SUPPLIES) IMPLANT
SYR 20CC LL (SYRINGE) IMPLANT
SYR BULB IRRIGATION 50ML (SYRINGE) ×3 IMPLANT
WATER STERILE IRR 3000ML UROMA (IV SOLUTION) ×3 IMPLANT

## 2011-04-07 NOTE — Op Note (Signed)
Preoperative diagnosis: Recurrent transitional cell carcinoma the bladder, history of transitional cell carcinoma of the right ureter status post reimplantation. Postoperative diagnosis: Same Procedure: Cystoscopy, bladder biopsy with fulguration, cystogram, left retrograde pyelogram, right ureteroscopy and TURBT. Surgeon: Bernestine Amass M.D. Anesthesia: Gen.  Indications: The patient has a history of transitional cell carcinoma of the urinary bladder as well as the distal right ureter. He has undergone distal right ureteral resection with reimplantation. Recent office followup cystoscopy revealed multiple areas of transitional cell carcinoma recurrence within the urinary bladder. He presents now for management of those as well as a reassessment of his entire urothelium. Risks benefits carefully discussed in full informed consent obtained. The patient has received perioperative antibiotics and placement of PAS compression boots.  Technique and findings: The patient was brought to the operating room where he had successful induction of general anesthesia. He was placed in lithotomy position prepped and draped in the usual manner. Appropriate surgical timeout was performed. Cystoscopy was then performed. The patient's reimplanted right ureter was noted again near the dome of the bladder. The patient had 4 discrete areas of recurrent transitional cell carcinoma. There were 2 areas on the posterior wall in 2 areas in the right hemitrigone near the bladder neck. The largest of these was approximately 6-8 mm in size. We performed cold cup biopsy/resection of the 2 posterior tumors which were then fulgurated. The tumor on the right hemitrigone was resected with a 28 French resectoscope. The specimens were sent separately. Left retrograde pyelograms performed with fluoroscopic interpretation. This showed a delicate left ureter without filling defect or obstruction. A cystogram was done which showed clear evidence  of reflux up the right ureter. Again no evidence of obvious obstruction or filling defect. Through a cystoscope a guidewire was placed in the right renal unit. Rigid ureteroscopy revealed unremarkable distal right ureter without evidence of obvious tumor recurrence at the area of the anastomosis. At the completion of the procedure hemostasis was excellent. There is no evidence of bladder injury/perforation. A 20 French Foley catheter was placed. We will order mitomycin to be instilled in the recovery room with plans for removal of the catheter status post that instillation. The patient was brought to PACU in stable condition having had no obvious complications or problems.

## 2011-04-07 NOTE — Interval H&P Note (Signed)
History and Physical Interval Note:   04/07/2011   7:29 AM   Isaac [name]  has presented today for surgery, with the diagnosis of bladder cancer, history of urethral cancer  The various methods of treatment have been discussed with the patient and family. After consideration of risks, benefits and other options for treatment, the patient has consented to  Procedure(s): CYSTOSCOPY WITH BIOPSY URETEROSCOPY as a surgical intervention .  The patients' history has been reviewed, patient examined, no change in status, stable for surgery.  I have reviewed the patients' chart and labs.  Questions were answered to the patient's satisfaction.     Bernestine Amass  MD

## 2011-04-07 NOTE — Anesthesia Procedure Notes (Addendum)
Procedure Name: LMA Insertion Date/Time: 04/07/2011 7:41 AM Performed by: Denna Haggard D Pre-anesthesia Checklist: Patient identified, Emergency Drugs available, Suction available and Patient being monitored Patient Re-evaluated:Patient Re-evaluated prior to inductionOxygen Delivery Method: Circle System Utilized Preoxygenation: Pre-oxygenation with 100% oxygen Intubation Type: IV induction Ventilation: Mask ventilation without difficulty LMA: LMA inserted LMA Size: 4.0 Number of attempts: 1 Placement Confirmation: positive ETCO2 Tube secured with: Tape Dental Injury: Teeth and Oropharynx as per pre-operative assessment

## 2011-04-07 NOTE — Anesthesia Preprocedure Evaluation (Addendum)
Anesthesia Evaluation  Patient identified by MRN, date of birth, ID band Patient awake    Reviewed: Allergy & Precautions, H&P , NPO status , Patient's Chart, lab work & pertinent test results  Airway Mallampati: III      Dental  (+) Teeth Intact   Pulmonary neg pulmonary ROS, sleep apnea ,    Pulmonary exam normal       Cardiovascular hypertension, + CAD, + Past MI and neg cardio ROS Regular Normal    Neuro/Psych Negative Neurological ROS  Negative Psych ROS   GI/Hepatic negative GI ROS, Neg liver ROS, GERD-  Medicated and Controlled,  Endo/Other  Negative Endocrine ROSDiabetes mellitus-, Type 2  Renal/GU negative Renal ROS  Genitourinary negative   Musculoskeletal negative musculoskeletal ROS (+)   Abdominal Normal abdominal exam  (+)   Peds negative pediatric ROS (+)  Hematology negative hematology ROS (+)   Anesthesia Other Findings   Reproductive/Obstetrics negative OB ROS                          Anesthesia Physical Anesthesia Plan  ASA: III  Anesthesia Plan: General   Post-op Pain Management:    Induction: Intravenous  Airway Management Planned: LMA  Additional Equipment:   Intra-op Plan:   Post-operative Plan:   Informed Consent: I have reviewed the patients History and Physical, chart, labs and discussed the procedure including the risks, benefits and alternatives for the proposed anesthesia with the patient or authorized representative who has indicated his/her understanding and acceptance.   Dental advisory given  Plan Discussed with: CRNA  Anesthesia Plan Comments:         Anesthesia Quick Evaluation

## 2011-04-07 NOTE — Transfer of Care (Signed)
Immediate Anesthesia Transfer of Care Note  Patient: Isaac [name]  Procedure(s) Performed:  CYSTOSCOPY WITH BIOPSY -   cystoscopy, cystogram, biopsy and fulgeration; URETEROSCOPY; TRANSURETHRAL RESECTION OF BLADDER TUMOR (TURBT)  Patient Location: PACU  Anesthesia Type: General  Level of Consciousness: awake, oriented, sedated and patient cooperative  Airway & Oxygen Therapy: Patient Spontanous Breathing and Patient connected to face mask oxygen  Post-op Assessment: Report given to PACU RN and Post -op Vital signs reviewed and stable  Post vital signs: Reviewed and stable  Complications: No apparent anesthesia complications

## 2011-04-07 NOTE — Progress Notes (Signed)
Pt continue to c/o burning. Dr. Risa Grill paged and informed.  Order received for pyridium.  Pt states he has some at home.

## 2011-04-07 NOTE — Anesthesia Postprocedure Evaluation (Signed)
  Anesthesia Post-op Note  Patient: Isaac [name]  Procedure(s) Performed:  CYSTOSCOPY WITH BIOPSY -   cystoscopy, cystogram, biopsy and fulgeration; URETEROSCOPY; TRANSURETHRAL RESECTION OF BLADDER TUMOR (TURBT)  Patient Location: PACU  Anesthesia Type: General  Level of Consciousness: awake and alert   Airway and Oxygen Therapy: Patient Spontanous Breathing  Post-op Pain: mild  Post-op Assessment: Post-op Vital signs reviewed, Patient's Cardiovascular Status Stable, Respiratory Function Stable, Patent Airway and No signs of Nausea or vomiting  Post-op Vital Signs: stable  Complications: No apparent anesthesia complications

## 2011-04-07 NOTE — Progress Notes (Signed)
Reported to B. Wynetta Emery, RN as caregiver

## 2011-04-10 ENCOUNTER — Encounter (HOSPITAL_BASED_OUTPATIENT_CLINIC_OR_DEPARTMENT_OTHER): Payer: Self-pay | Admitting: Urology

## 2011-05-15 ENCOUNTER — Other Ambulatory Visit (INDEPENDENT_AMBULATORY_CARE_PROVIDER_SITE_OTHER): Payer: Medicare Other | Admitting: *Deleted

## 2011-05-15 DIAGNOSIS — R748 Abnormal levels of other serum enzymes: Secondary | ICD-10-CM

## 2011-05-15 DIAGNOSIS — R7401 Elevation of levels of liver transaminase levels: Secondary | ICD-10-CM

## 2011-05-15 LAB — HEPATIC FUNCTION PANEL
ALT: 101 U/L — ABNORMAL HIGH (ref 0–53)
AST: 49 U/L — ABNORMAL HIGH (ref 0–37)
Albumin: 4.1 g/dL (ref 3.5–5.2)
Total Protein: 6.9 g/dL (ref 6.0–8.3)

## 2011-05-23 ENCOUNTER — Telehealth: Payer: Self-pay | Admitting: Internal Medicine

## 2011-05-23 NOTE — Telephone Encounter (Signed)
New problem:  Test results.  

## 2011-05-23 NOTE — Telephone Encounter (Signed)
Dr.Ross left a message that she will review his liver panel with a GI physician and call him back.

## 2011-05-30 ENCOUNTER — Encounter: Payer: Self-pay | Admitting: Internal Medicine

## 2011-06-05 ENCOUNTER — Ambulatory Visit: Payer: Self-pay | Admitting: Pulmonary Disease

## 2011-06-16 ENCOUNTER — Other Ambulatory Visit: Payer: Self-pay | Admitting: *Deleted

## 2011-06-16 DIAGNOSIS — E782 Mixed hyperlipidemia: Secondary | ICD-10-CM

## 2011-06-19 ENCOUNTER — Encounter: Payer: Self-pay | Admitting: Internal Medicine

## 2011-06-19 ENCOUNTER — Ambulatory Visit (INDEPENDENT_AMBULATORY_CARE_PROVIDER_SITE_OTHER): Payer: Medicare Other | Admitting: Internal Medicine

## 2011-06-19 VITALS — BP 128/84 | HR 80 | Temp 98.8°F | Ht 68.25 in | Wt 208.0 lb

## 2011-06-19 DIAGNOSIS — H6503 Acute serous otitis media, bilateral: Secondary | ICD-10-CM

## 2011-06-19 DIAGNOSIS — I2510 Atherosclerotic heart disease of native coronary artery without angina pectoris: Secondary | ICD-10-CM

## 2011-06-19 DIAGNOSIS — J029 Acute pharyngitis, unspecified: Secondary | ICD-10-CM

## 2011-06-19 DIAGNOSIS — F341 Dysthymic disorder: Secondary | ICD-10-CM

## 2011-06-19 DIAGNOSIS — Z8551 Personal history of malignant neoplasm of bladder: Secondary | ICD-10-CM

## 2011-06-19 DIAGNOSIS — Z85828 Personal history of other malignant neoplasm of skin: Secondary | ICD-10-CM | POA: Insufficient documentation

## 2011-06-19 DIAGNOSIS — J069 Acute upper respiratory infection, unspecified: Secondary | ICD-10-CM

## 2011-06-19 DIAGNOSIS — H6500 Acute serous otitis media, unspecified ear: Secondary | ICD-10-CM

## 2011-06-19 DIAGNOSIS — F329 Major depressive disorder, single episode, unspecified: Secondary | ICD-10-CM

## 2011-06-19 DIAGNOSIS — E1110 Type 2 diabetes mellitus with ketoacidosis without coma: Secondary | ICD-10-CM | POA: Insufficient documentation

## 2011-06-19 DIAGNOSIS — F32A Depression, unspecified: Secondary | ICD-10-CM | POA: Insufficient documentation

## 2011-06-19 DIAGNOSIS — E119 Type 2 diabetes mellitus without complications: Secondary | ICD-10-CM

## 2011-06-19 NOTE — Progress Notes (Signed)
  Subjective:    Patient ID: Isaac [name], male    DOB: 1956-02-18, 56 y.o.   MRN: [id number]  HPI 56 year old white male with history of diabetes mellitus, coronary artery disease, bladder cancer, skin cancer, anxiety depression. Three-day history of URI symptoms. Has had scratchy throat now developed into a significant sore throat, cough, nasal congestion. No fever or shaking chills.    Review of Systems     Objective:   Physical Exam HEENT exam: TMs are full bilaterally; pharynx red without exudate; rapid strep screen negative. Neck is supple without significant adenopathy. Chest clear; cardiac exam regular rate and rhythm        Assessment & Plan:  Serous otitis media acute  Pharyngitis  URI  Coronary artery disease  Diabetes mellitus  History of bladder cancer  Plan: Levaquin 500 milligrams daily for 10 days. Hycodan 8 ounces 1 teaspoon by mouth every 6 hours when necessary cough.

## 2011-06-19 NOTE — Patient Instructions (Signed)
Take Levaquin 500 milligrams daily for 10 days. Use Hycodan sparingly for cough. Call if not better in 10 days to 2 weeks.

## 2011-07-02 ENCOUNTER — Encounter: Payer: Self-pay | Admitting: Internal Medicine

## 2011-07-25 ENCOUNTER — Other Ambulatory Visit: Payer: Self-pay | Admitting: *Deleted

## 2011-07-25 NOTE — Telephone Encounter (Signed)
Patient has not been seen in over 1 year, 06/04/10. Pt will need an appt for refills. Patient cancelled 06/05/11 appt and did not reschedule.

## 2011-07-28 ENCOUNTER — Telehealth: Payer: Self-pay | Admitting: Internal Medicine

## 2011-07-28 NOTE — Telephone Encounter (Signed)
Called patient and advised that he still needs lab work done because Dr.Ross ordered a Hepatic and a Lipid Panel and his other physician ordered a Liver Panel but not a Lipid Panel. He will come in for lab work on 4/2.

## 2011-07-28 NOTE — Telephone Encounter (Signed)
Pt needs to know if he still needs to come in for lab work or did you get lab results form other provider

## 2011-08-12 ENCOUNTER — Other Ambulatory Visit (INDEPENDENT_AMBULATORY_CARE_PROVIDER_SITE_OTHER): Payer: Medicare Other

## 2011-08-12 DIAGNOSIS — E782 Mixed hyperlipidemia: Secondary | ICD-10-CM

## 2011-08-12 LAB — LIPID PANEL
Cholesterol: 101 mg/dL (ref 0–200)
HDL: 31.7 mg/dL — ABNORMAL LOW (ref >39.00)
LDL Cholesterol: 54 mg/dL (ref 0–99)
Triglycerides: 77 mg/dL (ref 0.0–149.0)
VLDL: 15.4 mg/dL (ref 0.0–40.0)

## 2011-08-12 LAB — HEPATIC FUNCTION PANEL
AST: 33 U/L (ref 0–37)
Total Bilirubin: 0.6 mg/dL (ref 0.3–1.2)

## 2011-08-25 ENCOUNTER — Telehealth: Payer: Self-pay | Admitting: Pulmonary Disease

## 2011-08-25 MED ORDER — ROPINIROLE HCL 1 MG PO TABS: ORAL_TABLET | ORAL | Status: DC

## 2011-08-25 NOTE — Telephone Encounter (Signed)
Ok to have one month supply with no refills.

## 2011-08-25 NOTE — Telephone Encounter (Signed)
Pt advised and he states to send one month supply to [facility].  Bing, CMA

## 2011-08-25 NOTE — Telephone Encounter (Signed)
Patient last seen by [facility] 1.24.12, follow up in 1 year > cancelled Jan '13 appt, no upcoming appts.  Per last ov:  Impression & Recommendations:  Problem # 1:  OBSTRUCTIVE SLEEP APNEA (ICD-327.23)  the pt overall is doing well with cpap.  He feels it has really helped his sleep and daytime alertness.  He is having some mask leaks that I suspect is due to old seals, although he may have to consider a different style of mask.  He will contact dme to correct this.  He has lost weight since his last visit, and I have encouraged him to continue.  Problem # 2:  RESTLESS LEGS SYNDROME (ICD-333.94)  he is doing well with requip.  will continue on this.  Other Orders: Est. Patient Level III [id number]  Patient Instructions: 1)  try getting new seal for your mask.  If this does not take care of leak, may have to consider a different type of mask 2)  turn heat up on humidifier to try and get more moisture. 3)  continue to work on weight loss.  You are doing well. 4)  followup with me in one year, but call if having issues. ----------------- Called spoke with patient, verified the name and dosage of medication requested.  Advised pt that he is overdue for appt with [facility] > appt scheduled 5.2.13 @ 0945.  Pt okay with this date and time.  Dr Gwenette Greet, may pt have refills?  Thanks!  [facility], 90 day supply.

## 2011-09-11 ENCOUNTER — Encounter: Payer: Self-pay | Admitting: Pulmonary Disease

## 2011-09-11 ENCOUNTER — Ambulatory Visit (INDEPENDENT_AMBULATORY_CARE_PROVIDER_SITE_OTHER): Payer: Medicare Other | Admitting: Pulmonary Disease

## 2011-09-11 VITALS — BP 120/78 | HR 68 | Temp 97.7°F | Ht 69.0 in | Wt 208.8 lb

## 2011-09-11 DIAGNOSIS — G4733 Obstructive sleep apnea (adult) (pediatric): Secondary | ICD-10-CM

## 2011-09-11 DIAGNOSIS — G2581 Restless legs syndrome: Secondary | ICD-10-CM

## 2011-09-11 MED ORDER — ROPINIROLE HCL 1 MG PO TABS: ORAL_TABLET | ORAL | Status: DC

## 2011-09-11 MED ORDER — ROPINIROLE HCL 0.5 MG PO TABS: ORAL_TABLET | ORAL | Status: DC

## 2011-09-11 NOTE — Assessment & Plan Note (Signed)
The patient is doing fairly well on Requip, and is not having any significant breakthrough.  I've asked him to continue on this medication.

## 2011-09-11 NOTE — Patient Instructions (Signed)
Stay on cpap, ask your dme about new masks as they are coming to market Continue to work on weight loss Stay on requip followup with me in one year.

## 2011-09-11 NOTE — Progress Notes (Signed)
  Subjective:    Patient ID: Isaac [name], male    DOB: Apr 14, 1956, 56 y.o.   MRN: [id number]  HPI The patient and then today for followup of his known obstructive sleep apnea and also restless leg syndrome.  He is wearing CPAP compliantly, and is having no significant mask or pressure issues.  He has leaks at times from his mask, but they are not a significant issue.  He feels that he is sleeping well, and is satisfied with his daytime alertness.  He continues on his dopamine agonist for his restless leg syndrome.   Review of Systems  Constitutional: Negative for fever and unexpected weight change.  HENT: Negative for ear pain, nosebleeds, congestion, sore throat, rhinorrhea, sneezing, trouble swallowing, dental problem, postnasal drip and sinus pressure.   Eyes: Negative for redness and itching.  Respiratory: Negative for cough, chest tightness, shortness of breath and wheezing.   Cardiovascular: Negative for palpitations and leg swelling.  Gastrointestinal: Negative for nausea and vomiting.  Genitourinary: Negative for dysuria.  Musculoskeletal: Negative for joint swelling.  Skin: Negative for rash.  Neurological: Positive for dizziness and light-headedness. Negative for headaches.  Hematological: Does not bruise/bleed easily.  Psychiatric/Behavioral: Negative for dysphoric mood. The patient is not nervous/anxious.        Objective:   Physical Exam Ow male in nad No skin breakdown or pressure necrosis from cpap mask No LE edema or cyanosis noted. Alert, does not appear sleepy, moves all 4.        Assessment & Plan:

## 2011-09-11 NOTE — Assessment & Plan Note (Signed)
The patient is wearing CPAP compliantly, and is having no significant issues.  He is satisfied with his sleep and daytime alertness.  He will occasionally have leaks from his mask, and have asked him to continue looking at the new masks as they become available over the course of the year.  I've also encouraged him to continue working on weight loss.

## 2011-09-30 ENCOUNTER — Other Ambulatory Visit: Payer: Self-pay | Admitting: Internal Medicine

## 2011-09-30 MED ORDER — ISOSORBIDE MONONITRATE ER 120 MG PO TB24: 120.0000 mg | ORAL_TABLET | Freq: Every day | ORAL | Status: DC

## 2011-09-30 NOTE — Telephone Encounter (Signed)
Refill - Isosorbide Mononitrate (Imdur) 120 MG 24 hr tablet   Verified preferred [facility], patient can be reached at [phone] for additional information.

## 2011-10-30 ENCOUNTER — Other Ambulatory Visit: Payer: Self-pay | Admitting: Urology

## 2011-12-09 ENCOUNTER — Other Ambulatory Visit: Payer: Self-pay

## 2011-12-09 MED ORDER — ISOSORBIDE MONONITRATE ER 120 MG PO TB24: 120.0000 mg | ORAL_TABLET | Freq: Every day | ORAL | Status: DC

## 2011-12-11 ENCOUNTER — Other Ambulatory Visit: Payer: Self-pay | Admitting: Internal Medicine

## 2011-12-11 MED ORDER — ISOSORBIDE MONONITRATE ER 120 MG PO TB24: 120.0000 mg | ORAL_TABLET | Freq: Every day | ORAL | Status: DC

## 2011-12-19 ENCOUNTER — Encounter (HOSPITAL_BASED_OUTPATIENT_CLINIC_OR_DEPARTMENT_OTHER): Payer: Self-pay | Admitting: *Deleted

## 2011-12-19 NOTE — Progress Notes (Signed)
NPO AFTER MN. ARRIVES AT 0615. NEEDS ISTAT. CURRENT EKG IN EPIC AND CHART. WILL TAKE PROTONIX, NORVASC, AND IMDUR AM OF SURG W. SIP OF WATER.

## 2011-12-28 NOTE — H&P (Signed)
Isaac [name] is an 56 y.o. male.   Chief Complaint: Recurrent bladder cancer. HPI:   [name] returns for follow-up. 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  Prior to that, Dr. 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  He is  status post right ureteral resection with reimplantation due to recurrent transitional cell carcinoma of the bladder and involvement of the distal right ureter (June 2010).  Again, we felt nephroureterectomy would be overaggressive therapy.  The patient had reassessment about 18 months ago (02/2009).  This included a cystogram which showed nice reflux up the right side with no evidence of stricturing, no evidence of filling defects.  We were able to drive the ureteroscope right up the ureter as well and has had no evidence of recurrence.  Urine cytology was unremarkable and there was no evidence of any bladder cancer.  Overall we were quite pleased with things and he has recovered from that procedure very nicely. He continues to do well. Follow up 06/2009 including cystoscopy and NMP-22 was negative.         Follow up 10/2009 cystoscopy and cytology were negative.   Follow up 02/2010 cystoscopy and cytology were negative  CT scan also ok.  [name] status post his most recent cystoscopy with bladder biopsy.     Since my distal ureterectomy with reimplant, he  has had no evidence of recurrences until his follow-up in February of 2012 where we saw some carpeting of papillary tumor.  The patient recently underwent cold-cup biopsy/resection of these areas along with fulguration.  Pathology did indeed confirm this to  be transitional cell carcinoma, but fortunately it was low-grade and noninvasive. He has had 2 induction courses of BCG and did not really tolerate the last one that well.    Isaac [name]  was noted to have several small  bladder tumor recurrences in November of 2012.    The patient had papillary tumors noted.  These were noninvasive, felt to be low grade on the trigone.  The posterior wall tumor looked mostly low grade with some small little foci of potentially higher grade tumor, but again, no evidence of invasion on either lesion.  The patient did have installation of mitomycin.        Past Medical History  Diagnosis Date  . Hypertension   . Dyslipidemia     pt unable to tolerate niaspan  . Coronary artery disease     followed by Dr. [name]  . GERD (gastroesophageal reflux disease)   . Diabetes mellitus     followed by Dr. Dwyane Dee  . History of non-ST elevation myocardial infarction (NSTEMI) 2005  . OSA on CPAP   . Bladder cancer followed by dr Risa Grill  . 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 History of angina CHRONIC -- CONTROLLED W/ IMDUR  . Peripheral neuropathy RIGHT LEG NUMBNESS    Past Surgical History  Procedure Date  . Umbillical hernia repair [id number]  . Elbow surgery 07-07-2003    RIGHT ELBOW ARTHROSCOPY W/ OPEN RECONSTRUCTION  . Shoulder surgery 2005    RIGHT ROTATOR CUFF REPAIR  . Cystoscopy with biopsy 04/07/2011    Procedure: CYSTOSCOPY WITH BIOPSY/ RIGHT URETEROSCOPY;  Surgeon: Bernestine Amass, MD;  Location: [facility];  Service: Urology;  Laterality: N/A;    cystoscopy,  cystogram, biopsy and fulgeration  . Cysto/ resection bladder tumor/ left retrograde pyelogram/ right ureteroscopy 08-07-2010    TCC OF BLADDER   S/P DISTAL URETERECTOMY/ REIMPLANTATION  . Cysto/ cystogram/ ureteroscopy 02-21-2009  . Right distal ureterectomy w/ reimplantation 10-30-2008    TCC OF RIGHT DISTAL URETER  . Cysto/ right ureteroscopy/ bx ureteral tumor 10-11-2008  . Cysto/ bladder bx 09-13-2008;  08-13-2007; 04-19-2007; 06-01-2006  . Cysto/ bilateral retrograde pyelogram/ right ureteroscopic laser fulguration ureteral tumor 02-16-2008  .  Rhinoplasty w/ modified cartilage graft 05-17-2007    INTERNAL NASAL VALVE COLLAPSE/ OSA/ SEPTAL PERFERATION  . Transurethral resection of bladder tumor 01-13-2007  . Appendectomy 08-03-2004  . Knee arthroscopy w/ debridement 02-08-2004    RIGHT KNEE  . Cardiac catheterization x3  last one 06-17-2007    MILD NON-OBSTRUCTIVE CAD/ NORMAL LVF/ 30% LEFT RENAL ARTERY STENOSIS    Family History  Problem Relation Age of Onset  . Coronary artery disease Father    Social History:  reports that he quit smoking about 4 years ago. His smoking use included Cigarettes. He has a 10 pack-year smoking history. He has never used smokeless tobacco. He reports that he does not drink alcohol. His drug history not on file.  Allergies: No Known Allergies  No prescriptions prior to admission    No results found for this or any previous visit (from the past 48 hour(s)). No results found.  Review of Systems - Negative except for anxiety  Height 5\' 9"  (1.753 m), weight 94.348 kg (208 lb). General appearance: alert, cooperative and no distress Neck: no adenopathy and no JVD Resp: clear to auscultation bilaterally Cardio: regular rate and rhythm GI: soft, non-tender; bowel sounds normal; no masses,  no organomegaly Male genitalia: normal, penis: no lesions or discharge. testes: no masses or tenderness. no hernias Pelvic: external genitalia normal Extremities: extremities normal, atraumatic, no cyanosis or edema Skin: Skin color, texture, turgor normal. No rashes or lesions Neurologic: Grossly normal  Assessment/Plan [name] clearly has a recurrent papillary tumor on his posterior wall. It is quite small and appears noninvasive and is likely to be low grade. I do not believe he would tolerate in-office fulguration and does not really want to give that a try. We will plan on a out-patient quick biopsy with fulguration. Otherwise, things are fairly stable. He continues to use Viagra with pretty good  results and has no new issues today.     Timya Trimmer S 12/28/2011, 1:01 PM

## 2011-12-29 ENCOUNTER — Encounter (HOSPITAL_BASED_OUTPATIENT_CLINIC_OR_DEPARTMENT_OTHER): Payer: Self-pay | Admitting: *Deleted

## 2011-12-29 ENCOUNTER — Ambulatory Visit (HOSPITAL_BASED_OUTPATIENT_CLINIC_OR_DEPARTMENT_OTHER): Payer: Medicare Other | Admitting: Anesthesiology

## 2011-12-29 ENCOUNTER — Encounter (HOSPITAL_BASED_OUTPATIENT_CLINIC_OR_DEPARTMENT_OTHER): Payer: Self-pay | Admitting: Anesthesiology

## 2011-12-29 ENCOUNTER — Ambulatory Visit (HOSPITAL_BASED_OUTPATIENT_CLINIC_OR_DEPARTMENT_OTHER)
Admission: RE | Admit: 2011-12-29 | Discharge: 2011-12-29 | Disposition: A | Discharge status: Home / Self Care | Payer: Medicare Other | Source: Ambulatory Visit | Attending: Urology | Admitting: Urology

## 2011-12-29 ENCOUNTER — Other Ambulatory Visit: Payer: Self-pay

## 2011-12-29 ENCOUNTER — Encounter (HOSPITAL_BASED_OUTPATIENT_CLINIC_OR_DEPARTMENT_OTHER)
Admission: RE | Disposition: A | Discharge status: Home / Self Care | Payer: Self-pay | Source: Ambulatory Visit | Attending: Urology

## 2011-12-29 DIAGNOSIS — I2510 Atherosclerotic heart disease of native coronary artery without angina pectoris: Secondary | ICD-10-CM | POA: Insufficient documentation

## 2011-12-29 DIAGNOSIS — C679 Malignant neoplasm of bladder, unspecified: Secondary | ICD-10-CM | POA: Insufficient documentation

## 2011-12-29 DIAGNOSIS — I10 Essential (primary) hypertension: Secondary | ICD-10-CM | POA: Insufficient documentation

## 2011-12-29 DIAGNOSIS — E785 Hyperlipidemia, unspecified: Secondary | ICD-10-CM | POA: Insufficient documentation

## 2011-12-29 DIAGNOSIS — G4733 Obstructive sleep apnea (adult) (pediatric): Secondary | ICD-10-CM | POA: Insufficient documentation

## 2011-12-29 DIAGNOSIS — E119 Type 2 diabetes mellitus without complications: Secondary | ICD-10-CM | POA: Insufficient documentation

## 2011-12-29 DIAGNOSIS — C669 Malignant neoplasm of unspecified ureter: Secondary | ICD-10-CM | POA: Insufficient documentation

## 2011-12-29 DIAGNOSIS — K219 Gastro-esophageal reflux disease without esophagitis: Secondary | ICD-10-CM | POA: Insufficient documentation

## 2011-12-29 HISTORY — PX: CYSTOSCOPY WITH BIOPSY: SHX5122

## 2011-12-29 HISTORY — DX: Personal history of malignant neoplasm of ureter: Z85.54

## 2011-12-29 HISTORY — DX: Personal history of other diseases of the circulatory system: Z86.79

## 2011-12-29 HISTORY — DX: Dependence on other enabling machines and devices: Z99.89

## 2011-12-29 HISTORY — DX: Old myocardial infarction: I25.2

## 2011-12-29 HISTORY — DX: Obstructive sleep apnea (adult) (pediatric): G47.33

## 2011-12-29 LAB — GLUCOSE, CAPILLARY: Glucose-Capillary: 173 mg/dL — ABNORMAL HIGH (ref 70–99)

## 2011-12-29 SURGERY — CYSTOSCOPY, WITH BIOPSY
Anesthesia: General | Site: Bladder | Wound class: Clean Contaminated

## 2011-12-29 MED ORDER — ACETAMINOPHEN 10 MG/ML IV SOLN: 1000.0000 mg | Freq: Once | INTRAVENOUS | Status: DC | PRN

## 2011-12-29 MED ORDER — LACTATED RINGERS IV SOLN
INTRAVENOUS | Status: DC
  Administered 2011-12-29: 07:00:00 via INTRAVENOUS

## 2011-12-29 MED ORDER — OXYCODONE HCL 5 MG/5ML PO SOLN: 5.0000 mg | Freq: Once | ORAL | Status: DC | PRN

## 2011-12-29 MED ORDER — PROMETHAZINE HCL 25 MG/ML IJ SOLN: 6.2500 mg | INTRAMUSCULAR | Status: DC | PRN

## 2011-12-29 MED ORDER — OXYCODONE HCL 5 MG PO TABS: 5.0000 mg | ORAL_TABLET | Freq: Once | ORAL | Status: DC | PRN

## 2011-12-29 MED ORDER — CIPROFLOXACIN IN D5W 400 MG/200ML IV SOLN
INTRAVENOUS | Status: DC | PRN
  Administered 2011-12-29: 400 mg via INTRAVENOUS

## 2011-12-29 MED ORDER — CIPROFLOXACIN IN D5W 400 MG/200ML IV SOLN
400.0000 mg | INTRAVENOUS | Status: AC
  Administered 2011-12-29: 400 mg via INTRAVENOUS

## 2011-12-29 MED ORDER — LACTATED RINGERS IV SOLN
INTRAVENOUS | Status: DC | PRN
  Administered 2011-12-29: 07:00:00 via INTRAVENOUS

## 2011-12-29 MED ORDER — HYDROMORPHONE HCL PF 1 MG/ML IJ SOLN: 0.2500 mg | INTRAMUSCULAR | Status: DC | PRN

## 2011-12-29 MED ORDER — BELLADONNA ALKALOIDS-OPIUM 16.2-60 MG RE SUPP
RECTAL | Status: DC | PRN
  Administered 2011-12-29: 1 via RECTAL

## 2011-12-29 MED ORDER — FENTANYL CITRATE 0.05 MG/ML IJ SOLN
INTRAMUSCULAR | Status: DC | PRN
  Administered 2011-12-29: 50 ug via INTRAVENOUS
  Administered 2011-12-29 (×2): 25 ug via INTRAVENOUS
  Administered 2011-12-29: 100 ug via INTRAVENOUS

## 2011-12-29 MED ORDER — STERILE WATER FOR IRRIGATION IR SOLN
Status: DC | PRN
  Administered 2011-12-29: 3000 mL

## 2011-12-29 MED ORDER — KETOROLAC TROMETHAMINE 30 MG/ML IJ SOLN
INTRAMUSCULAR | Status: DC | PRN
  Administered 2011-12-29: 30 mg via INTRAVENOUS

## 2011-12-29 MED ORDER — MIDAZOLAM HCL 5 MG/5ML IJ SOLN
INTRAMUSCULAR | Status: DC | PRN
  Administered 2011-12-29: 2 mg via INTRAVENOUS

## 2011-12-29 MED ORDER — PROPOFOL 10 MG/ML IV EMUL
INTRAVENOUS | Status: DC | PRN
  Administered 2011-12-29: 100 mg via INTRAVENOUS
  Administered 2011-12-29: 300 mg via INTRAVENOUS

## 2011-12-29 MED ORDER — ONDANSETRON HCL 4 MG/2ML IJ SOLN
INTRAMUSCULAR | Status: DC | PRN
  Administered 2011-12-29: 4 mg via INTRAVENOUS

## 2011-12-29 MED ORDER — LIDOCAINE HCL (CARDIAC) 20 MG/ML IV SOLN
INTRAVENOUS | Status: DC | PRN
  Administered 2011-12-29: 100 mg via INTRAVENOUS

## 2011-12-29 MED ORDER — MEPERIDINE HCL 25 MG/ML IJ SOLN: 6.2500 mg | INTRAMUSCULAR | Status: DC | PRN

## 2011-12-29 MED ORDER — LIDOCAINE HCL 2 % EX GEL
CUTANEOUS | Status: DC | PRN
  Administered 2011-12-29: 1

## 2011-12-29 SURGICAL SUPPLY — 19 items
BAG DRAIN URO-CYSTO SKYTR STRL (DRAIN) ×2 IMPLANT
CANISTER SUCT LVC 12 LTR MEDI- (MISCELLANEOUS) ×2 IMPLANT
CATH ROBINSON RED A/P 14FR (CATHETERS) IMPLANT
CATH ROBINSON RED A/P 16FR (CATHETERS) IMPLANT
CLOTH BEACON ORANGE TIMEOUT ST (SAFETY) ×2 IMPLANT
DRAPE CAMERA CLOSED 9X96 (DRAPES) ×2 IMPLANT
ELECT REM PT RETURN 9FT ADLT (ELECTROSURGICAL) ×2
ELECTRODE REM PT RTRN 9FT ADLT (ELECTROSURGICAL) ×1 IMPLANT
GLOVE BIO SURGEON STRL SZ7.5 (GLOVE) ×2 IMPLANT
GOWN STRL NON-REIN LRG LVL3 (GOWN DISPOSABLE) ×2 IMPLANT
GOWN STRL REIN XL XLG (GOWN DISPOSABLE) ×2 IMPLANT
NDL SAFETY ECLIPSE 18X1.5 (NEEDLE) IMPLANT
NEEDLE HYPO 18GX1.5 SHARP (NEEDLE)
NEEDLE HYPO 22GX1.5 SAFETY (NEEDLE) IMPLANT
NS IRRIG 500ML POUR BTL (IV SOLUTION) IMPLANT
PACK CYSTOSCOPY (CUSTOM PROCEDURE TRAY) ×2 IMPLANT
SYR 20CC LL (SYRINGE) IMPLANT
SYR BULB IRRIGATION 50ML (SYRINGE) IMPLANT
WATER STERILE IRR 3000ML UROMA (IV SOLUTION) ×2 IMPLANT

## 2011-12-29 NOTE — Anesthesia Preprocedure Evaluation (Addendum)
Anesthesia Evaluation  Patient identified by MRN, date of birth, ID band Patient awake    Reviewed: Allergy & Precautions, H&P , NPO status , Patient's Chart, lab work & pertinent test results, reviewed documented beta blocker date and time   History of Anesthesia Complications Negative for: history of anesthetic complications  Airway Mallampati: I TM Distance: >3 FB Neck ROM: Full    Dental  (+) Teeth Intact and Dental Advisory Given   Pulmonary sleep apnea and Continuous Positive Airway Pressure Ventilation , former smoker,  breath sounds clear to auscultation  Pulmonary exam normal       Cardiovascular Exercise Tolerance: Good hypertension, Pt. on medications and Pt. on home beta blockers + CAD and + Past MI Rhythm:Regular Rate:Normal     Neuro/Psych    GI/Hepatic Neg liver ROS, GERD-  Medicated and Controlled,  Endo/Other  Type 2  Renal/GU negative Renal ROS     Musculoskeletal   Abdominal (+)  Abdomen: soft.    Peds  Hematology negative hematology ROS (+)   Anesthesia Other Findings   Reproductive/Obstetrics                          Anesthesia Physical Anesthesia Plan  ASA: III  Anesthesia Plan: General   Post-op Pain Management:    Induction: Intravenous  Airway Management Planned: LMA  Additional Equipment:   Intra-op Plan:   Post-operative Plan: Extubation in OR  Informed Consent: I have reviewed the patients History and Physical, chart, labs and discussed the procedure including the risks, benefits and alternatives for the proposed anesthesia with the patient or authorized representative who has indicated his/her understanding and acceptance.   Dental advisory given  Plan Discussed with: CRNA and Surgeon  Anesthesia Plan Comments:         Anesthesia Quick Evaluation

## 2011-12-29 NOTE — Op Note (Signed)
Preoperative diagnosis: Recurrent transitional cell carcinoma bladder. Postoperative diagnosis: Same Procedure: Cystoscopy with bladder biopsy/cold cup resection x2 and fulguration.   Surgeon: Bernestine Amass M.D.  Anesthesia: Gen.  Indications: Patient has a history of transitional cell carcinoma involving the right ureter and bladder. Full urologic history can be found in the patient's history and physical. Recent office cystoscopy revealed several areas of what appeared to be low-grade papillary transitional cell carcinoma recurrence primarily on the posterior wall the bladder. The patient has received perioperative antibiotics.      Technique and findings: Patient was brought to the operating room where he had successful induction of general anesthesia. He was placed in lithotomy position and prepped and draped usual manner. Cystoscopy was performed with both the 70 as well as 12 lens systems. The patient had 2 small areas of papillary tumor involving the posterior wall. We were also able to visualize the reimplanted right ureter and the distal aspect of that was carefully inspected and was without evidence of obvious tumor recurrence. The small posterior wall bladder tumors were cold cup resected and then fulgurated with a Bugbee electrode. There was no evidence of bladder perforation or other complication. The patient was brought to recovery room stable condition.

## 2011-12-29 NOTE — Transfer of Care (Signed)
Immediate Anesthesia Transfer of Care Note  Patient: Isaac [name]  Procedure(s) Performed: Procedure(s) (LRB): CYSTOSCOPY WITH BIOPSY (N/A)  Patient Location: PACU  Anesthesia Type: General  Level of Consciousness: awake, sedated, patient cooperative and responds to stimulation  Airway & Oxygen Therapy: Patient Spontanous Breathing and Patient connected to face mask oxygen  Post-op Assessment: Report given to PACU RN, Post -op Vital signs reviewed and stable and Patient moving all extremities  Post vital signs: Reviewed and stable  Complications: No apparent anesthesia complications

## 2011-12-29 NOTE — Anesthesia Postprocedure Evaluation (Signed)
Anesthesia Post Note  Patient: Isaac [name]  Procedure(s) Performed: Procedure(s) (LRB): CYSTOSCOPY WITH BIOPSY (N/A)  Anesthesia type: General  Patient location: PACU  Post pain: Pain level controlled  Post assessment: Post-op Vital signs reviewed  Last Vitals: BP 112/76  Pulse 64  Temp 36.3 C (Oral)  Resp 20  Ht 5\' 9"  (1.753 m)  Wt 208 lb (94.348 kg)  BMI 30.72 kg/m2  SpO2 95%  Post vital signs: Reviewed  Level of consciousness: sedated  Complications: No apparent anesthesia complications

## 2011-12-29 NOTE — Anesthesia Procedure Notes (Signed)
Procedure Name: LMA Insertion Date/Time: 12/29/2011 7:38 AM Performed by: Justice Rocher Pre-anesthesia Checklist: Patient identified, Emergency Drugs available, Suction available and Patient being monitored Patient Re-evaluated:Patient Re-evaluated prior to inductionOxygen Delivery Method: Circle System Utilized Preoxygenation: Pre-oxygenation with 100% oxygen Intubation Type: IV induction Ventilation: Mask ventilation without difficulty LMA: LMA with gastric port inserted LMA Size: 5.0 Number of attempts: 1 Placement Confirmation: positive ETCO2 Tube secured with: Tape Dental Injury: Teeth and Oropharynx as per pre-operative assessment

## 2011-12-29 NOTE — Interval H&P Note (Signed)
History and Physical Interval Note:  12/29/2011 7:31 AM  Isaac [name]  has presented today for surgery, with the diagnosis of BLADDER CANCER  The various methods of treatment have been discussed with the patient and family. After consideration of risks, benefits and other options for treatment, the patient has consented to  Procedure(s) (LRB): CYSTOSCOPY WITH BIOPSY (N/A) as a surgical intervention .  The patient's history has been reviewed, patient examined, no change in status, stable for surgery.  I have reviewed the patient's chart and labs.  Questions were answered to the patient's satisfaction.     Alivia Cimino S

## 2011-12-30 ENCOUNTER — Encounter (HOSPITAL_BASED_OUTPATIENT_CLINIC_OR_DEPARTMENT_OTHER): Payer: Self-pay | Admitting: Urology

## 2012-01-16 ENCOUNTER — Encounter: Payer: Self-pay | Admitting: Gastroenterology

## 2012-02-12 ENCOUNTER — Ambulatory Visit (AMBULATORY_SURGERY_CENTER): Payer: Medicare Other | Admitting: *Deleted

## 2012-02-12 ENCOUNTER — Encounter: Payer: Self-pay | Admitting: Gastroenterology

## 2012-02-12 VITALS — Ht 70.0 in | Wt 208.0 lb

## 2012-02-12 DIAGNOSIS — Z1211 Encounter for screening for malignant neoplasm of colon: Secondary | ICD-10-CM

## 2012-02-12 MED ORDER — MOVIPREP 100 G PO SOLR: ORAL | Status: DC

## 2012-02-26 ENCOUNTER — Encounter: Payer: Self-pay | Admitting: Gastroenterology

## 2012-02-26 ENCOUNTER — Ambulatory Visit (AMBULATORY_SURGERY_CENTER): Payer: Medicare Other | Admitting: Gastroenterology

## 2012-02-26 VITALS — BP 112/59 | HR 62 | Temp 97.1°F | Resp 21 | Ht 70.0 in | Wt 208.0 lb

## 2012-02-26 DIAGNOSIS — Z860100 Personal history of colon polyps, unspecified: Secondary | ICD-10-CM

## 2012-02-26 DIAGNOSIS — D126 Benign neoplasm of colon, unspecified: Secondary | ICD-10-CM

## 2012-02-26 DIAGNOSIS — Z1211 Encounter for screening for malignant neoplasm of colon: Secondary | ICD-10-CM

## 2012-02-26 MED ORDER — SODIUM CHLORIDE 0.9 % IV SOLN: 500.0000 mL | INTRAVENOUS | Status: DC

## 2012-02-26 NOTE — Patient Instructions (Addendum)
Findings:  Polyps, diverticulosis Recommendations:  High Fiber Diet with liberal fluid intake, Repeat colonoscopy in 5 years.  YOU HAD AN ENDOSCOPIC PROCEDURE TODAY AT [facility]: Refer to the procedure report that was given to you for any specific questions about what was found during the examination.  If the procedure report does not answer your questions, please call your gastroenterologist to clarify.  If you requested that your care partner not be given the details of your procedure findings, then the procedure report has been included in a sealed envelope for you to review at your convenience later.  YOU SHOULD EXPECT: Some feelings of bloating in the abdomen. Passage of more gas than usual.  Walking can help get rid of the air that was put into your GI tract during the procedure and reduce the bloating. If you had a lower endoscopy (such as a colonoscopy or flexible sigmoidoscopy) you may notice spotting of blood in your stool or on the toilet paper. If you underwent a bowel prep for your procedure, then you may not have a normal bowel movement for a few days.  DIET: Your first meal following the procedure should be a light meal and then it is ok to progress to your normal diet.  A half-sandwich or bowl of soup is an example of a good first meal.  Heavy or fried foods are harder to digest and may make you feel nauseous or bloated.  Likewise meals heavy in dairy and vegetables can cause extra gas to form and this can also increase the bloating.  Drink plenty of fluids but you should avoid alcoholic beverages for 24 hours.  ACTIVITY: Your care partner should take you home directly after the procedure.  You should plan to take it easy, moving slowly for the rest of the day.  You can resume normal activity the day after the procedure however you should NOT DRIVE or use heavy machinery for 24 hours (because of the sedation medicines used during the test).    SYMPTOMS TO REPORT  IMMEDIATELY: A gastroenterologist can be reached at any hour.  During normal business hours, 8:30 AM to 5:00 PM Monday through Friday, call [phone].  After hours and on weekends, please call the GI answering service at [phone] who will take a message and have the physician on call contact you.   Following lower endoscopy (colonoscopy or flexible sigmoidoscopy):  Excessive amounts of blood in the stool  Significant tenderness or worsening of abdominal pains  Swelling of the abdomen that is new, acute  Fever of 100F or higher  Following upper endoscopy (EGD)  Vomiting of blood or coffee ground material  New chest pain or pain under the shoulder blades  Painful or persistently difficult swallowing  New shortness of breath  Fever of 100F or higher  Black, tarry-looking stools  FOLLOW UP: If any biopsies were taken you will be contacted by phone or by letter within the next 1-3 weeks.  Call your gastroenterologist if you have not heard about the biopsies in 3 weeks.  Our staff will call the home number listed on your records the next business day following your procedure to check on you and address any questions or concerns that you may have at that time regarding the information given to you following your procedure. This is a courtesy call and so if there is no answer at the home number and we have not heard from you through the emergency physician on call, we  will assume that you have returned to your regular daily activities without incident.  SIGNATURES/CONFIDENTIALITY: You and/or your care partner have signed paperwork which will be entered into your electronic medical record.  These signatures attest to the fact that that the information above on your After Visit Summary has been reviewed and is understood.  Full responsibility of the confidentiality of this discharge information lies with you and/or your care-partner.   Please follow all discharge instructions given to you  by the recovery room nurse. If you have any questions or problems after discharge please call one of the numbers listed above. You will receive a phone call in the am to see how you are doing and answer any questions you may have. Thank you for choosing [facility] for your health care needs.

## 2012-02-26 NOTE — Progress Notes (Signed)
Patient did not experience any of the following events: a burn prior to discharge; a fall within the facility; wrong site/side/patient/procedure/implant event; or a hospital transfer or hospital admission upon discharge from the facility. (G8907) Patient did not have preoperative order for IV antibiotic SSI prophylaxis. (G8918)  

## 2012-02-26 NOTE — Op Note (Signed)
[facility]  [address] [place], [address]   COLONOSCOPY PROCEDURE REPORT  PATIENT: Isaac [name], Isaac [name]  MR#: [id number] BIRTHDATE: Aug 27, 1955 , 56  yrs. old GENDER: Male ENDOSCOPIST: [name], MD, [facility]  PROCEDURE DATE:  02/26/2012 PROCEDURE:   Colonoscopy with biopsy ASA CLASS:   Class II INDICATIONS:patient's personal history of adenomatous colon polyps.  MEDICATIONS: MAC sedation, administered by CRNA and propofol (Diprivan) 200mg  IV DESCRIPTION OF PROCEDURE:   After the risks benefits and alternatives of the procedure were thoroughly explained, informed consent was obtained.  A digital rectal exam revealed no abnormalities of the rectum.   The LB [id number]  endoscope was introduced through the anus and advanced to the cecum, which was identified by both the appendix and ileocecal valve. No adverse events experienced.   The quality of the prep was good, using MoviPrep  The instrument was then slowly withdrawn as the colon was fully examined.   COLON FINDINGS: Two sessile polyps measuring 4 mm in size were found in the sigmoid colon.  A polypectomy was performed with cold forceps.  The resection was complete and the polyp tissue was completely retrieved.   Moderate diverticulosis was noted in the sigmoid colon.   The colon was otherwise normal.  There was no diverticulosis, inflammation, polyps or cancers unless previously stated.  Retroflexed views revealed no abnormalities. The time to cecum=1 minutes 22 seconds.  Withdrawal time=12 minutes 30 seconds. The scope was withdrawn and the procedure completed. COMPLICATIONS: There were no complications.  ENDOSCOPIC IMPRESSION: 1.   Two sessile polyps in the sigmoid colon; polypectomy was performed with cold forceps 2.   Moderate diverticulosis was noted in the sigmoid colon  RECOMMENDATIONS: 1.  await pathology results 2.  High fiber diet with liberal fluid intake. 3.  repeat Colonoscopy  in 5 years.  eSigned:  [name], MD, [facility] 02/26/2012 10:04 AM

## 2012-02-27 ENCOUNTER — Telehealth: Payer: Self-pay

## 2012-02-27 NOTE — Telephone Encounter (Signed)
  Follow up Call-  Call back number 02/26/2012  Post procedure Call Back phone  # [phone]  Permission to leave phone message Yes     Patient questions:  Do you have a fever, pain , or abdominal swelling? no Pain Score  0 *  Have you tolerated food without any problems? yes  Have you been able to return to your normal activities? yes  Do you have any questions about your discharge instructions: Diet   no Medications  no Follow up visit  no  Do you have questions or concerns about your Care? no  Actions: * If pain score is 4 or above: No action needed, pain <4.  Per the pt, "I wanted to thank  everyone for all they did, everyone is so nice". Maw

## 2012-03-01 ENCOUNTER — Telehealth: Payer: Self-pay | Admitting: Gastroenterology

## 2012-03-01 NOTE — Telephone Encounter (Signed)
Patient called to report that he had a colonoscopy on Thursday of last week.  Friday evening he developed a sore throat, cough, and "my chest feels tight".  He is advised that symptoms should not be related to colonoscopy.  He does deny constipation, diarrhea, rectal bleeding, abdominal pain, or fever.  He is advised that he should follow up with his primary care MD if his symptoms don't resolve in the next few days or worsen.

## 2012-03-01 NOTE — Telephone Encounter (Signed)
agree

## 2012-03-02 ENCOUNTER — Encounter: Payer: Self-pay | Admitting: Internal Medicine

## 2012-03-02 ENCOUNTER — Ambulatory Visit (INDEPENDENT_AMBULATORY_CARE_PROVIDER_SITE_OTHER): Payer: Medicare Other | Admitting: Internal Medicine

## 2012-03-02 VITALS — BP 124/78 | HR 72 | Temp 97.5°F | Ht 68.5 in | Wt 211.0 lb

## 2012-03-02 DIAGNOSIS — Z8551 Personal history of malignant neoplasm of bladder: Secondary | ICD-10-CM

## 2012-03-02 DIAGNOSIS — J069 Acute upper respiratory infection, unspecified: Secondary | ICD-10-CM

## 2012-03-02 DIAGNOSIS — E119 Type 2 diabetes mellitus without complications: Secondary | ICD-10-CM

## 2012-03-02 DIAGNOSIS — I2510 Atherosclerotic heart disease of native coronary artery without angina pectoris: Secondary | ICD-10-CM

## 2012-03-02 NOTE — Progress Notes (Signed)
  Subjective:    Patient ID: Isaac [name], male    DOB: 1956/04/03, 56 y.o.   MRN: [id number]  HPI 56 year old white male with history of coronary artery disease, bladder cancer, adult onset diabetes mellitus in today with acute respiratory infection symptoms. No fever or shaking chills. Patient says symptoms started after having colonoscopy late last week. Has sinus pressure and nasal congestion. Sounds hoarse and congested. Cough is nonproductive. Dr. Dwyane Dee saw him recently and gave him a flu vaccine. Dr. Dwyane Dee follows him for diabetes.    Review of Systems     Objective:   Physical Exam  HENT:  Right Ear: External ear normal.  Left Ear: External ear normal.  Nose: Nose normal.  Mouth/Throat: Oropharynx is clear and moist. No oropharyngeal exudate.  Eyes: No scleral icterus.  Neck: Neck supple.  Pulmonary/Chest: Effort normal and breath sounds normal. No respiratory distress. He has no wheezes. He has no rales. He exhibits no tenderness.  Lymphadenopathy:    He has no cervical adenopathy.  Skin: Skin is warm and dry. He is not diaphoretic.  Psychiatric: He has a normal mood and affect.          Assessment & Plan:  Acute URI  Diabetes mellitus  Coronary artery disease  History of bladder cancer  Hyperlipidemia  Plan: Levaquin 500 milligrams daily with meals for 10 days. Hycodan 8 ounces 1 teaspoon by mouth Q6 to 8 hours when necessary cough with no refill.

## 2012-03-02 NOTE — Patient Instructions (Addendum)
Take antibiotic as prescribed for 10 days with meals. Take cough syrup sparingly. Call if not better in 10 days or sooner if worse.

## 2012-03-03 ENCOUNTER — Encounter: Payer: Self-pay | Admitting: Gastroenterology

## 2012-03-11 ENCOUNTER — Other Ambulatory Visit: Payer: Self-pay

## 2012-03-11 MED ORDER — PANTOPRAZOLE SODIUM 40 MG PO TBEC: 40.0000 mg | DELAYED_RELEASE_TABLET | Freq: Every morning | ORAL | Status: DC

## 2012-03-22 ENCOUNTER — Ambulatory Visit (INDEPENDENT_AMBULATORY_CARE_PROVIDER_SITE_OTHER): Payer: Medicare Other | Admitting: Internal Medicine

## 2012-03-22 ENCOUNTER — Encounter: Payer: Self-pay | Admitting: Internal Medicine

## 2012-03-22 VITALS — BP 127/79 | HR 65 | Ht 69.6 in | Wt 209.0 lb

## 2012-03-22 DIAGNOSIS — I2510 Atherosclerotic heart disease of native coronary artery without angina pectoris: Secondary | ICD-10-CM

## 2012-03-22 DIAGNOSIS — R42 Dizziness and giddiness: Secondary | ICD-10-CM

## 2012-03-22 LAB — CBC WITH DIFFERENTIAL/PLATELET
Basophils Absolute: 0 10*3/uL (ref 0.0–0.1)
Basophils Relative: 0.4 % (ref 0.0–3.0)
Eosinophils Absolute: 0.2 10*3/uL (ref 0.0–0.7)
Lymphocytes Relative: 31.8 % (ref 12.0–46.0)
MCHC: 33.8 g/dL (ref 30.0–36.0)
Neutrophils Relative %: 54.9 % (ref 43.0–77.0)
RBC: 4.65 Mil/uL (ref 4.22–5.81)

## 2012-03-22 LAB — HEPATIC FUNCTION PANEL
Alkaline Phosphatase: 53 U/L (ref 39–117)
Bilirubin, Direct: 0.2 mg/dL (ref 0.0–0.3)
Total Bilirubin: 0.9 mg/dL (ref 0.3–1.2)
Total Protein: 6.9 g/dL (ref 6.0–8.3)

## 2012-03-22 MED ORDER — PANTOPRAZOLE SODIUM 40 MG PO TBEC: 40.0000 mg | DELAYED_RELEASE_TABLET | Freq: Every morning | ORAL | Status: DC

## 2012-03-22 MED ORDER — ISOSORBIDE MONONITRATE ER 120 MG PO TB24: 120.0000 mg | ORAL_TABLET | Freq: Every morning | ORAL | Status: DC

## 2012-03-22 MED ORDER — METOPROLOL SUCCINATE ER 25 MG PO TB24: 25.0000 mg | ORAL_TABLET | Freq: Every evening | ORAL | Status: DC

## 2012-03-22 NOTE — Progress Notes (Signed)
HPI HPIPatient is a 56 year old with a history of mild CAD and probable microvascular dz with TIMII II flow in LAD and LCx. Also a history of DM, dyslipidemia, HTN and bladder CA. I last  saw him last winter.  Patient has had tightness intermitt  Not associated with activity  Some mild SOB  Notes occasional L sided chest discomfort that is different.  Hurt  Not pleuritic.Several times Neither associated with activity Tired more, gives out Had URI 10/22.  Symptoms of foggy, dizzy, not clear happened before cold No Known Allergies  Current Outpatient Prescriptions  Medication Sig Dispense Refill  . amLODipine (NORVASC) 5 MG tablet Take 5 mg by mouth 2 (two) times daily.       Marland Kitchen aspirin 325 MG tablet Take 325 mg by mouth daily.       Marland Kitchen glimepiride (AMARYL) 2 MG tablet Take 2 mg by mouth daily before breakfast.      . isosorbide mononitrate (IMDUR) 120 MG 24 hr tablet Take 120 mg by mouth every morning.      . metFORMIN (GLUCOPHAGE) 1000 MG tablet Take 500 mg in am and takes 1000 mg before evening meal      . metoprolol succinate (TOPROL-XL) 25 MG 24 hr tablet Take 25 mg by mouth every evening.      . pantoprazole (PROTONIX) 40 MG tablet Take 1 tablet (40 mg total) by mouth every morning.  90 tablet  3  . rOPINIRole (REQUIP) 1 MG tablet Take 1.5 mg by mouth at bedtime. Take 1 tab by mouth at bedtime (along with the 0.5mg  tab for a total of 1.5mg  daily)      . rosuvastatin (CRESTOR) 5 MG tablet Take 5 mg by mouth 3 (three) times a week. Take on M W F  30 tablet  11  . HUMALOG KWIKPEN 100 UNIT/ML injection         Past Medical History  Diagnosis Date  . Hypertension   . Dyslipidemia     pt unable to tolerate niaspan  . Coronary artery disease     followed by Dr. [name]  . GERD (gastroesophageal reflux disease)   . Diabetes mellitus     followed by Dr. Dwyane Dee  . History of non-ST elevation myocardial infarction (NSTEMI) 2005  . OSA on CPAP   . Bladder cancer followed by dr Risa Grill  .  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 History of angina CHRONIC -- CONTROLLED W/ IMDUR  . Peripheral neuropathy RIGHT LEG NUMBNESS    Past Surgical History  Procedure Date  . Umbillical hernia repair [id number]  . Elbow surgery 07-07-2003    RIGHT ELBOW ARTHROSCOPY W/ OPEN RECONSTRUCTION  . Shoulder surgery 2005    RIGHT ROTATOR CUFF REPAIR  . Cystoscopy with biopsy 04/07/2011    Procedure: CYSTOSCOPY WITH BIOPSY/ RIGHT URETEROSCOPY;  Surgeon: Bernestine Amass, MD;  Location: [facility];  Service: Urology;  Laterality: N/A;    cystoscopy, cystogram, biopsy and fulgeration  . Cysto/ resection bladder tumor/ left retrograde pyelogram/ right ureteroscopy 08-07-2010    TCC OF BLADDER   S/P DISTAL URETERECTOMY/ REIMPLANTATION  . Cysto/ cystogram/ ureteroscopy 02-21-2009  . Right distal ureterectomy w/ reimplantation 10-30-2008    TCC OF RIGHT DISTAL URETER  . Cysto/ right ureteroscopy/ bx ureteral tumor 10-11-2008  . Cysto/ bladder bx 09-13-2008;  08-13-2007; 04-19-2007; 06-01-2006  . Cysto/ bilateral retrograde pyelogram/ right ureteroscopic laser fulguration ureteral tumor 02-16-2008  .  Rhinoplasty w/ modified cartilage graft 05-17-2007    INTERNAL NASAL VALVE COLLAPSE/ OSA/ SEPTAL PERFERATION  . Transurethral resection of bladder tumor 01-13-2007  . Appendectomy 08-03-2004  . Knee arthroscopy w/ debridement 02-08-2004    RIGHT KNEE  . Cardiac catheterization x3  last one 06-17-2007    MILD NON-OBSTRUCTIVE CAD/ NORMAL LVF/ 30% LEFT RENAL ARTERY STENOSIS  . Cystoscopy with biopsy 12/29/2011    Procedure: CYSTOSCOPY WITH BIOPSY;  Surgeon: Bernestine Amass, MD;  Location: [facility];  Service: Urology;  Laterality: N/A;  30 MIN  WITH FULGERATION      Family History  Problem Relation Age of Onset  . Coronary artery disease Father   . Colon cancer Neg Hx   . Stomach cancer Neg Hx     History   Social History  .  Marital Status: Single    Spouse Name: N/A    Number of Children: N/A  . Years of Education: N/A   Occupational History  . Not on file.   Social History Main Topics  . Smoking status: Former Smoker -- 0.5 packs/day for 20 years    Types: Cigarettes    Quit date: 05/13/2007  . Smokeless tobacco: Never Used  . Alcohol Use: No  . Drug Use: No  . Sexually Active: Not on file   Other Topics Concern  . Not on file   Social History Narrative  . No narrative on file    Review of Systems:  All systems reviewed.  They are negative to the above problem except as previously stated.  Vital Signs: BP 127/79  Pulse 65  Ht 5' 9.6" (1.768 m)  Wt 209 lb (94.802 kg)  BMI 30.33 kg/m2  Physical Exam Patient is in NAD [employer] maneuver  Patient became very dizzy.  No nystagmus HEENT:  Normocephalic, atraumatic. EOMI, PERRLA.  Neck: JVP is normal.  No bruits.  Lungs: clear to auscultation. No rales no wheezes.  Heart: Regular rate and rhythm. Normal S1, S2. No S3.   No significant murmurs. PMI not displaced.  Abdomen:  Supple, nontender. Normal bowel sounds. No masses. No hepatomegaly.  Extremities:   Good distal pulses throughout. No lower extremity edema.  Musculoskeletal :moving all extremities.  Neuro:   alert and oriented x3.  CN II-XII grossly intact.  EKG:  SR  70 bpm.   Assessment and Plan:  1.  Dizziness.  He is not orthostatic on exam.  I think his symptoms are more vestibular in origin [facility] maneuver induced dizziness.  Will refer for formal vestibular testing.  2.  Coronary vasospasm.  Patient with intermtt tightness, really not changed  Does note some fatigue  Will defer any testing until after #1 tested.  I am not convinced represents worsening angina  3.  HL  Continues on crestor.  Will check LFTs.

## 2012-03-22 NOTE — Patient Instructions (Signed)
Will refer you to [place] [address] [phone]

## 2012-04-01 ENCOUNTER — Encounter: Payer: Self-pay | Admitting: Internal Medicine

## 2012-04-02 ENCOUNTER — Other Ambulatory Visit: Payer: Self-pay | Admitting: Urology

## 2012-04-05 ENCOUNTER — Ambulatory Visit: Payer: Medicare Other | Attending: Internal Medicine | Admitting: Rehabilitative and Restorative Service Providers"

## 2012-04-05 DIAGNOSIS — IMO0001 Reserved for inherently not codable concepts without codable children: Secondary | ICD-10-CM | POA: Insufficient documentation

## 2012-04-05 DIAGNOSIS — R42 Dizziness and giddiness: Secondary | ICD-10-CM | POA: Insufficient documentation

## 2012-04-13 ENCOUNTER — Encounter (HOSPITAL_BASED_OUTPATIENT_CLINIC_OR_DEPARTMENT_OTHER): Payer: Self-pay | Admitting: *Deleted

## 2012-04-13 NOTE — Progress Notes (Signed)
NPO AFTER MN. ARRIVES AT 0930. NEEDS ISTAT. CURRENT EKG IN EPIC AND CHART. WILL TAKE NORVASC, PROTONIX, AND IMDUR AM OF SURG W/ SIP OF WATER.

## 2012-04-16 NOTE — H&P (Signed)
Reason For Visit         Isaac [name] returns for routine transitional cell carcinoma follow-up.  Again, his most recent recurrence was in his bladder in August of 2013. At that time he had low volume tumor recurrence on the posterior wall. Again these tumors were low-grade and superficial. In November of 2012 he underwent ureteroscopy as well as retrograde pyelogram on the contralateral side as assessment of his upper tracts, which looked clear.     History of Present Illness        Past Gu Hx:           [name] returns for follow-up. 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  Prior to that, Dr. 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  He is  status post right ureteral resection with reimplantation due to recurrent transitional cell carcinoma of the bladder and involvement of the distal right ureter (June 2010).  Again, we felt nephroureterectomy would be overaggressive therapy.  The patient had reassessment about 18 months ago (02/2009).  This included a cystogram which showed nice reflux up the right side with no evidence of stricturing, no evidence of filling defects.  We were able to drive the ureteroscope right up the ureter as well and has had no evidence of recurrence.  Urine cytology was unremarkable and there was no evidence of any bladder cancer.  Overall we were quite pleased with things and he has recovered from that procedure very nicely. He continues to do well. Follow up 06/2009 including cystoscopy and NMP-22 was negative.         Follow up 10/2009 cystoscopy and cytology were negative.   Follow up 02/2010 cystoscopy and cytology were negative  CT scan also ok.  [name] status post his most recent cystoscopy with bladder biopsy.     Since my distal ureterectomy with reimplant, he  has had no evidence of recurrences until his  follow-up in February of 2012 where we saw some carpeting of papillary tumor.  The patient recently underwent cold-cup biopsy/resection of these areas along with fulguration.  Pathology did indeed confirm this to  be transitional cell carcinoma, but fortunately it was low-grade and noninvasive. He has had 2 induction courses of BCG and did not really tolerate the last one that well.    Isaac [name]  was noted to have several small bladder tumor recurrences in November of 2012.    The patient had papillary tumors noted.  These were noninvasive, felt to be low grade on the trigone.  The posterior wall tumor looked mostly low grade with some small little foci of potentially higher grade tumor, but again, no evidence of invasion on either lesion.  The patient did have installation of mitomycin.       Past Medical History Problems  1. History of  Acute Myocardial Infarction V12.59 2. History of  Adult Sleep Apnea 780.57 3. History of  Anxiety (Symptom) 799.2 4. History of  Bladder Cancer V10.51 5. History of  Depression 311 6. History of  Diabetes Mellitus 250.00 7. History of  Esophageal Reflux 530.81 8. History of  Hypercholesterolemia 272.0 9. History of  Hypertension 401.9  Surgical History Problems  1. History of  Appendectomy 2. History of  Bladder Injection Of Cancer Treatment 3. History of  Bladder Surgery 4. History of  Cystoscopy With Biopsy 5.  History of  Cystoscopy With Biopsy 6. History of  Cystoscopy With Biopsy 7. History of  Cystoscopy With Fulguration Small Lesion (5-30mm) 8. History of  Cystoscopy With Fulguration Small Lesion (5-53mm) 9. History of  Cystoscopy With Fulguration Small Lesion (5-31mm) 10. History of  Cystoscopy With Insertion Of Ureteral Stent Right 11. History of  Cystoscopy With Insertion Of Ureteral Stent Right 12. History of  Cystoscopy With Ureteroscopy For Biopsy Right 13. History of  Cystoscopy With Ureteroscopy Right 14. History of  Cystoscopy With  Ureteroscopy Right 15. History of  Cystoscopy With Ureteroscopy Right 16. History of  Cystoscopy With Ureteroscopy Right 17. History of  Cystoscopy With Ureteroscopy With Fulguration Of Lesion 18. History of  Elbow Surgery 19. History of  Inguinal Hernia Repair 20. History of  Shoulder Surgery 21. History of  Sinus Surgery 22. History of  Ureteroneocystostomy With Bladder Flap  Current Meds 1. Aspirin 325 MG Oral Tablet; TAKE 1 TABLET DAILY; Therapy: (Recorded:14Feb2012) to 2. Crestor 10 MG Oral Tablet; TAKE 1/2 TABLET DAILY; Therapy: (Recorded:14Feb2012) to 3. Glimepiride 2 MG Oral Tablet; 1 tablet daily; Therapy: [id number] to 4. GlyBURIDE 5 MG Oral Tablet; TAKE 1 TABLET DAILY; Therapy: (Recorded:05Apr2012) to 5. Isosorbide Mononitrate ER 120 MG Oral Tablet Extended Release 24 Hour; TAKE 1 TABLET  DAILY; Therapy: 28Aug2010 to 6. Janumet 50-1000 MG Oral Tablet; 1 tablet twice with meals; Therapy: [id number] to 7. Lantus SOLN; 20units 1 x daily; Therapy: (Recorded:05Apr2012) to 8. Norvasc 5 MG Oral Tablet; TAKE 1 TABLET TWICE DAILY; Therapy: (Recorded:14Feb2012) to 9. Pantoprazole Sodium 40 MG Oral Tablet Delayed Release; 1 per day; Therapy: [id number] to 10. Requip 1 MG Oral Tablet; 1.5 mg; 1 time daily; Therapy: (Recorded:14Feb2012) to 11. ROPINIRole HCl 1 MG Oral Tablet; 1mg ; 1 1/2 tablet at night; Therapy: [id number] to 12. Toprol XL 25 MG Oral Tablet Extended Release 24 Hour; TAKE 1 TABLET DAILY; Therapy:   (Recorded:14Feb2012) to 13. Victoza 18 MG/3ML Subcutaneous Solution; Therapy: [id number] to  Allergies Medication  1. No Known Drug Allergies  Family History Problems  1. Paternal history of  Death In The Family Father AccidentAge 52 2. Maternal history of  Death In The Family Mother AccidentAge 51 3. Maternal grandmother's history of  Diabetes Mellitus V18.0  Social History Problems  1. Alcohol Use 1 a day 2. Caffeine Use 5 a day 3. Marital History - Single 4.  History of  Tobacco Use V15.82 smoked for 30 years and quit 3 years ago  Review of Systems Genitourinary, constitutional, skin, eye, otolaryngeal, hematologic/lymphatic, cardiovascular, pulmonary, endocrine, musculoskeletal, gastrointestinal, neurological and psychiatric system(s) were reviewed and pertinent findings if present are noted.  Genitourinary: urinary frequency, but no difficulty starting the urinary stream, urine stream is not weak and no hematuria.  Gastrointestinal: no vomiting and no abdominal pain.  Constitutional: feeling tired (fatigue), but no fever.  Hematologic/Lymphatic: a tendency to easily bruise.  Cardiovascular: chest pain.  Respiratory: shortness of breath.  Psychiatric: anxiety.    Vitals Vital Signs [Data Includes: Last 1 Day]  [id number] 09:43AM  Blood Pressure: 119 / 79 Temperature: 97.3 F Heart Rate: 26  Well-developed well-nourished male in no acute distress Respiratory: Normal effort Cardiac: Regular in rhythm Abdomen: Soft nontender Extremities: No edema/tenderness  Results/Data Urine [Data Includes: Last 1 Day]   [id number] COLOR YELLOW  APPEARANCE CLEAR  SPECIFIC GRAVITY 1.015  pH 6.0  GLUCOSE NEG mg/dL BILIRUBIN NEG  KETONE NEG mg/dL BLOOD NEG  PROTEIN NEG mg/dL UROBILINOGEN 0.2 mg/dL NITRITE NEG  LEUKOCYTE ESTERASE  NEG   Procedure  Procedure: Cystoscopy   Indication: History of Urothelial Carcinoma.  Informed Consent: Risks, benefits, and potential adverse events were discussed and informed consent was obtained from the patient . Specific risks including, but not limited to bleeding, infection, pain, allergic reaction etc. were explained.  Prep: The patient was prepped with betadine.  Anesthesia:. Local anesthesia was administered intraurethrally with 2% lidocaine jelly.  Antibiotic prophylaxis: Ciprofloxacin.  Procedure Note:  Urethral meatus:. No abnormalities.  Anterior urethra: No abnormalities.  Prostatic urethra: No  abnormalities.  Bladder: Visulization was clear. A solitary tumor was visualized in the bladder. A papillary tumor was seen in the bladder measuring approximately 2 cm in size. This tumor was located on the posterior aspect of the bladder. The patient tolerated the procedure well.  Complications: None.    Assessment Assessed  1. Bladder Cancer 188.9 2. 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 PSA  Requested for: [id number]  Discussion/Summary  Isaac [name] has evidence of a papillary tumor recurrence of his posterior wall. This measures 1-1/2-2 cm. It again appears well-differentiated and probably superficial. I recommended resection/fulguration of this. He is had this multiple times understands the rationale as well as the risks benefits and typical recovery. Mitomycin will be installed after the procedure.   Signatures Electronically signed by : Rana Snare, M.D.; Mar 30 2012 10:28AM

## 2012-04-19 ENCOUNTER — Ambulatory Visit (HOSPITAL_BASED_OUTPATIENT_CLINIC_OR_DEPARTMENT_OTHER)
Admission: RE | Admit: 2012-04-19 | Discharge: 2012-04-19 | Disposition: A | Discharge status: Home / Self Care | Payer: Medicare Other | Source: Ambulatory Visit | Attending: Urology | Admitting: Urology

## 2012-04-19 ENCOUNTER — Encounter (HOSPITAL_BASED_OUTPATIENT_CLINIC_OR_DEPARTMENT_OTHER)
Admission: RE | Disposition: A | Discharge status: Home / Self Care | Payer: Self-pay | Source: Ambulatory Visit | Attending: Urology

## 2012-04-19 ENCOUNTER — Encounter (HOSPITAL_BASED_OUTPATIENT_CLINIC_OR_DEPARTMENT_OTHER): Payer: Self-pay | Admitting: Anesthesiology

## 2012-04-19 ENCOUNTER — Encounter (HOSPITAL_BASED_OUTPATIENT_CLINIC_OR_DEPARTMENT_OTHER): Payer: Self-pay | Admitting: *Deleted

## 2012-04-19 ENCOUNTER — Ambulatory Visit (HOSPITAL_BASED_OUTPATIENT_CLINIC_OR_DEPARTMENT_OTHER): Payer: Medicare Other | Admitting: Anesthesiology

## 2012-04-19 DIAGNOSIS — I10 Essential (primary) hypertension: Secondary | ICD-10-CM | POA: Insufficient documentation

## 2012-04-19 DIAGNOSIS — K219 Gastro-esophageal reflux disease without esophagitis: Secondary | ICD-10-CM | POA: Insufficient documentation

## 2012-04-19 DIAGNOSIS — E7800 Pure hypercholesterolemia, unspecified: Secondary | ICD-10-CM | POA: Insufficient documentation

## 2012-04-19 DIAGNOSIS — I252 Old myocardial infarction: Secondary | ICD-10-CM | POA: Insufficient documentation

## 2012-04-19 DIAGNOSIS — C679 Malignant neoplasm of bladder, unspecified: Secondary | ICD-10-CM | POA: Insufficient documentation

## 2012-04-19 DIAGNOSIS — Z7982 Long term (current) use of aspirin: Secondary | ICD-10-CM | POA: Insufficient documentation

## 2012-04-19 DIAGNOSIS — D0910 Carcinoma in situ of unspecified urinary organ: Secondary | ICD-10-CM | POA: Insufficient documentation

## 2012-04-19 DIAGNOSIS — Z79899 Other long term (current) drug therapy: Secondary | ICD-10-CM | POA: Insufficient documentation

## 2012-04-19 DIAGNOSIS — E119 Type 2 diabetes mellitus without complications: Secondary | ICD-10-CM | POA: Insufficient documentation

## 2012-04-19 DIAGNOSIS — G4730 Sleep apnea, unspecified: Secondary | ICD-10-CM | POA: Insufficient documentation

## 2012-04-19 HISTORY — PX: TRANSURETHRAL RESECTION OF BLADDER TUMOR: SHX2575

## 2012-04-19 HISTORY — PX: CYSTOSCOPY W/ RETROGRADES: SHX1426

## 2012-04-19 HISTORY — DX: Angina pectoris with documented spasm: I20.1

## 2012-04-19 LAB — GLUCOSE, CAPILLARY: Glucose-Capillary: 173 mg/dL — ABNORMAL HIGH (ref 70–99)

## 2012-04-19 LAB — POCT I-STAT 4, (NA,K, GLUC, HGB,HCT)
Glucose, Bld: 163 mg/dL — ABNORMAL HIGH (ref 70–99)
HCT: 45 % (ref 39.0–52.0)
Hemoglobin: 15.3 g/dL (ref 13.0–17.0)

## 2012-04-19 SURGERY — CYSTOSCOPY, WITH RETROGRADE PYELOGRAM
Anesthesia: General | Site: Bladder | Wound class: Clean Contaminated

## 2012-04-19 MED ORDER — ONDANSETRON HCL 4 MG/2ML IJ SOLN
INTRAMUSCULAR | Status: DC | PRN
  Administered 2012-04-19: 4 mg via INTRAVENOUS

## 2012-04-19 MED ORDER — MIDAZOLAM HCL 5 MG/5ML IJ SOLN
INTRAMUSCULAR | Status: DC | PRN
  Administered 2012-04-19: 2 mg via INTRAVENOUS

## 2012-04-19 MED ORDER — MIDAZOLAM HCL 5 MG/ML IJ SOLN
2.0000 mg | Freq: Once | INTRAMUSCULAR | Status: AC
  Administered 2012-04-19: 2 mg via INTRAVENOUS
  Filled 2012-04-19: qty 1

## 2012-04-19 MED ORDER — IOHEXOL 350 MG/ML SOLN
INTRAVENOUS | Status: DC | PRN
  Administered 2012-04-19: 10 mL via INTRAVENOUS

## 2012-04-19 MED ORDER — MITOMYCIN CHEMO FOR BLADDER INSTILLATION 40 MG
40.0000 mg | Freq: Once | INTRAVENOUS | Status: AC
  Administered 2012-04-19: 40 mg via INTRAVESICAL
  Filled 2012-04-19: qty 40

## 2012-04-19 MED ORDER — OXYCODONE-ACETAMINOPHEN 5-325 MG PO TABS
1.0000 | ORAL_TABLET | ORAL | Status: AC | PRN
  Administered 2012-04-19: 1 via ORAL
  Filled 2012-04-19: qty 1

## 2012-04-19 MED ORDER — MEPERIDINE HCL 25 MG/ML IJ SOLN
6.2500 mg | INTRAMUSCULAR | Status: DC | PRN
  Filled 2012-04-19: qty 1

## 2012-04-19 MED ORDER — LIDOCAINE HCL (CARDIAC) 20 MG/ML IV SOLN
INTRAVENOUS | Status: DC | PRN
  Administered 2012-04-19: 60 mg via INTRAVENOUS

## 2012-04-19 MED ORDER — OXYCODONE-ACETAMINOPHEN 5-500 MG PO CAPS: 1.0000 | ORAL_CAPSULE | ORAL | Status: DC | PRN

## 2012-04-19 MED ORDER — PHENAZOPYRIDINE HCL 200 MG PO TABS
200.0000 mg | ORAL_TABLET | Freq: Three times a day (TID) | ORAL | Status: AC
  Administered 2012-04-19: 200 mg via ORAL
  Filled 2012-04-19: qty 1

## 2012-04-19 MED ORDER — STERILE WATER FOR IRRIGATION IR SOLN
Status: DC | PRN
  Administered 2012-04-19: 3000 mL

## 2012-04-19 MED ORDER — ROCURONIUM BROMIDE 100 MG/10ML IV SOLN
INTRAVENOUS | Status: DC | PRN
  Administered 2012-04-19: 20 mg via INTRAVENOUS

## 2012-04-19 MED ORDER — FENTANYL CITRATE 0.05 MG/ML IJ SOLN
25.0000 ug | INTRAMUSCULAR | Status: DC | PRN
  Administered 2012-04-19: 25 ug via INTRAVENOUS
  Administered 2012-04-19: 50 ug via INTRAVENOUS
  Filled 2012-04-19: qty 1

## 2012-04-19 MED ORDER — LIDOCAINE HCL 2 % EX GEL
CUTANEOUS | Status: DC | PRN
  Administered 2012-04-19: 1

## 2012-04-19 MED ORDER — GLYCOPYRROLATE 0.2 MG/ML IJ SOLN
INTRAMUSCULAR | Status: DC | PRN
  Administered 2012-04-19: 0.4 mg via INTRAVENOUS

## 2012-04-19 MED ORDER — DIATRIZOATE MEGLUMINE 30 % UR SOLN
URETHRAL | Status: DC | PRN
  Administered 2012-04-19: 300 mL via URETHRAL

## 2012-04-19 MED ORDER — LACTATED RINGERS IV SOLN
INTRAVENOUS | Status: DC
  Filled 2012-04-19: qty 1000

## 2012-04-19 MED ORDER — LACTATED RINGERS IV SOLN
INTRAVENOUS | Status: DC
  Administered 2012-04-19: 09:00:00 via INTRAVENOUS
  Filled 2012-04-19: qty 1000

## 2012-04-19 MED ORDER — PROMETHAZINE HCL 25 MG/ML IJ SOLN
6.2500 mg | INTRAMUSCULAR | Status: DC | PRN
  Filled 2012-04-19: qty 1

## 2012-04-19 MED ORDER — CIPROFLOXACIN IN D5W 400 MG/200ML IV SOLN
400.0000 mg | INTRAVENOUS | Status: AC
  Administered 2012-04-19: 400 mg via INTRAVENOUS
  Filled 2012-04-19: qty 200

## 2012-04-19 MED ORDER — NEOSTIGMINE METHYLSULFATE 1 MG/ML IJ SOLN
INTRAMUSCULAR | Status: DC | PRN
  Administered 2012-04-19: 3 mg via INTRAVENOUS

## 2012-04-19 MED ORDER — PROPOFOL 10 MG/ML IV BOLUS
INTRAVENOUS | Status: DC | PRN
  Administered 2012-04-19: 200 mg via INTRAVENOUS

## 2012-04-19 MED ORDER — SUCCINYLCHOLINE CHLORIDE 20 MG/ML IJ SOLN
INTRAMUSCULAR | Status: DC | PRN
  Administered 2012-04-19: 100 mg via INTRAVENOUS

## 2012-04-19 MED ORDER — FENTANYL CITRATE 0.05 MG/ML IJ SOLN
INTRAMUSCULAR | Status: DC | PRN
  Administered 2012-04-19 (×2): 50 ug via INTRAVENOUS

## 2012-04-19 SURGICAL SUPPLY — 49 items
ADAPTER CATH URET PLST 4-6FR (CATHETERS) IMPLANT
BAG DRAIN URO-CYSTO SKYTR STRL (DRAIN) ×3 IMPLANT
BAG URINE DRAINAGE (UROLOGICAL SUPPLIES) IMPLANT
BAG URINE LEG 19OZ MD ST LTX (BAG) IMPLANT
BENZOIN TINCTURE PRP APPL 2/3 (GAUZE/BANDAGES/DRESSINGS) IMPLANT
CANISTER SUCT LVC 12 LTR MEDI- (MISCELLANEOUS) ×3 IMPLANT
CATH FOLEY 2WAY SLVR  5CC 16FR (CATHETERS) ×1
CATH FOLEY 2WAY SLVR  5CC 20FR (CATHETERS)
CATH FOLEY 2WAY SLVR  5CC 22FR (CATHETERS)
CATH FOLEY 2WAY SLVR 5CC 16FR (CATHETERS) ×2 IMPLANT
CATH FOLEY 2WAY SLVR 5CC 20FR (CATHETERS) IMPLANT
CATH FOLEY 2WAY SLVR 5CC 22FR (CATHETERS) IMPLANT
CATH INTERMIT  6FR 70CM (CATHETERS) ×3 IMPLANT
CATH ROBINSON RED A/P 20FR (CATHETERS) ×3 IMPLANT
CATH URET 5FR 28IN CONE TIP (BALLOONS)
CATH URET 5FR 28IN OPEN ENDED (CATHETERS) IMPLANT
CATH URET 5FR 70CM CONE TIP (BALLOONS) IMPLANT
CLOTH BEACON ORANGE TIMEOUT ST (SAFETY) ×3 IMPLANT
DRAPE CAMERA CLOSED 9X96 (DRAPES) ×3 IMPLANT
DRSG TEGADERM 2-3/8X2-3/4 SM (GAUZE/BANDAGES/DRESSINGS) IMPLANT
ELECT BUTTON BIOP 24F 90D PLAS (MISCELLANEOUS) IMPLANT
ELECT LOOP HF 26F 30D .35MM (CUTTING LOOP) IMPLANT
ELECT NEEDLE 45D HF 24-28F 12D (CUTTING LOOP) IMPLANT
ELECT REM PT RETURN 9FT ADLT (ELECTROSURGICAL) ×3
ELECTRODE REM PT RTRN 9FT ADLT (ELECTROSURGICAL) ×2 IMPLANT
EVACUATOR MICROVAS BLADDER (UROLOGICAL SUPPLIES) IMPLANT
GLOVE BIO SURGEON STRL SZ7.5 (GLOVE) ×3 IMPLANT
GOWN PREVENTION PLUS XLARGE (GOWN DISPOSABLE) ×3 IMPLANT
GOWN STRL NON-REIN LRG LVL3 (GOWN DISPOSABLE) ×3 IMPLANT
GOWN STRL REIN XL XLG (GOWN DISPOSABLE) ×3 IMPLANT
GOWN XL W/COTTON TOWEL STD (GOWNS) ×3 IMPLANT
GUIDEWIRE 0.038 PTFE COATED (WIRE) IMPLANT
GUIDEWIRE ANG ZIPWIRE 038X150 (WIRE) IMPLANT
GUIDEWIRE STR DUAL SENSOR (WIRE) ×3 IMPLANT
HOLDER FOLEY CATH W/STRAP (MISCELLANEOUS) IMPLANT
KIT ASPIRATION TUBING (SET/KITS/TRAYS/PACK) IMPLANT
KIT BALLIN UROMAX 15FX10 (LABEL) IMPLANT
KIT BALLN UROMAX 15FX4 (MISCELLANEOUS) IMPLANT
KIT BALLN UROMAX 26 75X4 (MISCELLANEOUS)
LOOP CUTTING 24FR OLYMPUS (CUTTING LOOP) IMPLANT
LOOP ELECTRODE 28FR (MISCELLANEOUS) IMPLANT
NS IRRIG 500ML POUR BTL (IV SOLUTION) IMPLANT
PACK CYSTOSCOPY (CUSTOM PROCEDURE TRAY) ×3 IMPLANT
PLUG CATH AND CAP STER (CATHETERS) IMPLANT
SET ASPIRATION TUBING (TUBING) IMPLANT
SET HIGH PRES BAL DIL (LABEL)
SHEATH URET ACCESS 12FR/35CM (UROLOGICAL SUPPLIES) IMPLANT
SHEATH URET ACCESS 12FR/55CM (UROLOGICAL SUPPLIES) IMPLANT
SYRINGE IRR TOOMEY STRL 70CC (SYRINGE) ×3 IMPLANT

## 2012-04-19 NOTE — Anesthesia Preprocedure Evaluation (Signed)
Anesthesia Evaluation  Patient identified by MRN, date of birth, ID band Patient awake    Reviewed: Allergy & Precautions, H&P , NPO status , Patient's Chart, lab work & pertinent test results, reviewed documented beta blocker date and time   History of Anesthesia Complications Negative for: history of anesthetic complications  Airway Mallampati: I TM Distance: >3 FB Neck ROM: Full    Dental  (+) Teeth Intact and Dental Advisory Given   Pulmonary sleep apnea and Continuous Positive Airway Pressure Ventilation , former smoker,  breath sounds clear to auscultation  Pulmonary exam normal       Cardiovascular Exercise Tolerance: Good hypertension, Pt. on medications and Pt. on home beta blockers + CAD and + Past MI Rhythm:Regular Rate:Normal     Neuro/Psych    GI/Hepatic Neg liver ROS, GERD-  Medicated and Controlled,  Endo/Other  diabetes, Type 2  Renal/GU negative Renal ROS     Musculoskeletal   Abdominal (+)  Abdomen: soft.    Peds  Hematology negative hematology ROS (+)   Anesthesia Other Findings   Reproductive/Obstetrics                           Anesthesia Physical  Anesthesia Plan  ASA: III  Anesthesia Plan: General   Post-op Pain Management:    Induction: Intravenous  Airway Management Planned: LMA  Additional Equipment:   Intra-op Plan:   Post-operative Plan: Extubation in OR  Informed Consent: I have reviewed the patients History and Physical, chart, labs and discussed the procedure including the risks, benefits and alternatives for the proposed anesthesia with the patient or authorized representative who has indicated his/her understanding and acceptance.   Dental advisory given  Plan Discussed with: CRNA and Surgeon  Anesthesia Plan Comments:         Anesthesia Quick Evaluation

## 2012-04-19 NOTE — Op Note (Signed)
Preoperative diagnosis: Recurrent transitional cell carcinoma of the bladder Postoperative diagnosis: Same  Procedure: Cystoscopy, left retrograde pyelogram, transurethral resection of bladder tumor and cystogram  and instillation of mitomycin in PACU. Surgeon: Bernestine Amass M.D.  Anesthesia: Gen.  Indications: [name] is 56 years of age. He has a history of transitional cell carcinoma. Approximately 4-5 years ago he underwent a distal right ureteral resection with reimplantation due to tumor. The patient is had a multitude of bladder recurrences. His tumors have been low-grade and noninvasive. The patient was recently noted to have a recurrent tumor at the dome of his bladder. This appeared to be a recurrent transitional cell carcinoma. He is here for resection of that as well as assessment of his upper tracts.     Technique and findings: Patient was brought to the operating room where he had successful induction of general anesthesia. Placed in lithotomy position and prepped and draped in usual manner. He received perioperative antibiotics and PAS compression boots. Appropriate surgical timeout was performed. Cystoscopy revealed unremarkable anterior prostatic urethra. The patient's tumor was again noted at the dome of the bladder. This appeared to be papillary. It measured approximately 2.5 x 3 cm. The tumor was cold cup resected. We attempted to obtain some underlying muscles well this tumor did appear to be superficial. At completion of the resection the patient had fulguration of the biopsy site. No evidence of bladder perforation occurred.   The patient had left retrograde pyelogram with fluoroscopic interpretation. This was done with an open-ended catheter. The ureter itself was delicate and there was no evidence of filling defect. To assess the right ureter it was necessary to perform a cystogram. For that reason a red Robinson catheter was placed and 300 cc of contrast was instilled in the  bladder. There was no evidence of any bladder perforation or leak from the resection. Dye could be seen refluxing up the right side. There was mild dilation of the ureter but no filling defects or evidence of obstruction. A 16 French Foley catheter was inserted. The bladder was completely drained and urine was clear. We will plan on mitomycin instillation in the PACU.

## 2012-04-19 NOTE — Anesthesia Postprocedure Evaluation (Signed)
  Anesthesia Post-op Note  Patient: Isaac [name]  Procedure(s) Performed: Procedure(s) (LRB): CYSTOSCOPY WITH RETROGRADE PYELOGRAM (Bilateral) TRANSURETHRAL RESECTION OF BLADDER TUMOR (TURBT) (N/A)  Patient Location: PACU  Anesthesia Type: General  Level of Consciousness: awake and alert   Airway and Oxygen Therapy: Patient Spontanous Breathing  Post-op Pain: mild  Post-op Assessment: Post-op Vital signs reviewed, Patient's Cardiovascular Status Stable, Respiratory Function Stable, Patent Airway and No signs of Nausea or vomiting  Last Vitals:  Filed Vitals:   04/19/12 1125  BP: 136/92  Pulse:   Temp: 36.1 C  Resp: 17    Post-op Vital Signs: stable   Complications: No apparent anesthesia complications

## 2012-04-19 NOTE — Interval H&P Note (Signed)
History and Physical Interval Note:  04/19/2012 9:39 AM  Isaac [name]  has presented today for surgery, with the diagnosis of RECURRENT BLADDER CANCER  The various methods of treatment have been discussed with the patient and family. After consideration of risks, benefits and other options for treatment, the patient has consented to  Procedure(s) (LRB) with comments: CYSTOSCOPY WITH RETROGRADE PYELOGRAM (Bilateral) - 30 MINS Cysto, Biopsy, possible TURBT, Bilateral retrograde pyelograms with Mitomycin C instilation Post op  TRANSURETHRAL RESECTION OF BLADDER TUMOR (TURBT) (N/A) as a surgical intervention .  The patient's history has been reviewed, patient examined, no change in status, stable for surgery.  I have reviewed the patient's chart and labs.  Questions were answered to the patient's satisfaction.     [name]

## 2012-04-19 NOTE — Transfer of Care (Signed)
Immediate Anesthesia Transfer of Care Note  Patient: Isaac [name]  Procedure(s) Performed: Procedure(s) (LRB): CYSTOSCOPY WITH RETROGRADE PYELOGRAM (Bilateral) TRANSURETHRAL RESECTION OF BLADDER TUMOR (TURBT) (N/A)  Patient Location: PACU  Anesthesia Type: General  Level of Consciousness: awake, oriented, sedated and patient cooperative  Airway & Oxygen Therapy: Patient Spontanous Breathing and Patient connected to face mask oxygen  Post-op Assessment: Report given to PACU RN and Post -op Vital signs reviewed and stable  Post vital signs: Reviewed and stable  Complications: No apparent anesthesia complications

## 2012-04-19 NOTE — Anesthesia Procedure Notes (Signed)
Procedure Name: LMA Insertion Date/Time: 04/19/2012 10:41 AM Performed by: Denna Haggard D Pre-anesthesia Checklist: Patient identified, Emergency Drugs available, Suction available and Patient being monitored Patient Re-evaluated:Patient Re-evaluated prior to inductionOxygen Delivery Method: Circle System Utilized Preoxygenation: Pre-oxygenation with 100% oxygen Intubation Type: IV induction Ventilation: Mask ventilation without difficulty LMA: LMA inserted LMA Size: 4.0 Number of attempts: 1 Airway Equipment and Method: bite block Placement Confirmation: positive ETCO2 Tube secured with: Tape Dental Injury: Teeth and Oropharynx as per pre-operative assessment

## 2012-04-20 ENCOUNTER — Encounter (HOSPITAL_BASED_OUTPATIENT_CLINIC_OR_DEPARTMENT_OTHER): Payer: Self-pay | Admitting: Urology

## 2012-05-26 ENCOUNTER — Telehealth: Payer: Self-pay | Admitting: Internal Medicine

## 2012-05-26 NOTE — Telephone Encounter (Signed)
Left hand went numb on Saturday and went to ED.  Saw neurologist today and he saw a couple of places on brain where blood vessels had burst at some time but was not anything to worry about.  He was told sometimes taking the ASA 325 mg daily can cause this.  Recommended ASA 81 mg or Plavix but wanted him to check with Dr Harrington Challenger.  Will forward to her for review

## 2012-05-26 NOTE — Telephone Encounter (Signed)
Need note from neurologist in ED  As well as head CT  It does not look like he went to [facility].

## 2012-05-26 NOTE — Telephone Encounter (Signed)
Pt calling re going to ER due to hand numbess, then saw neurologist who suggested stopping asa and going on Plavix but to check with Dr Harrington Challenger

## 2012-05-27 NOTE — Telephone Encounter (Signed)
Received note from Neuro. Will review with Dr.Ross on 1/17

## 2012-06-03 ENCOUNTER — Telehealth: Payer: Self-pay | Admitting: *Deleted

## 2012-06-03 NOTE — Telephone Encounter (Signed)
Would recomm 81 mg enteric coated ASA. Called patient and left message concerning ASA.

## 2012-06-03 NOTE — Telephone Encounter (Signed)
See phone note from 06/03/2012. OK to take ASA 81mg  every day per Dr.Ross. LMOM with above information.

## 2012-09-15 ENCOUNTER — Encounter: Payer: Self-pay | Admitting: Pulmonary Disease

## 2012-09-15 ENCOUNTER — Ambulatory Visit (INDEPENDENT_AMBULATORY_CARE_PROVIDER_SITE_OTHER): Payer: Medicare Other | Admitting: Pulmonary Disease

## 2012-09-15 VITALS — BP 128/82 | HR 68 | Temp 98.2°F | Ht 68.25 in | Wt 207.0 lb

## 2012-09-15 DIAGNOSIS — G4733 Obstructive sleep apnea (adult) (pediatric): Secondary | ICD-10-CM

## 2012-09-15 DIAGNOSIS — G2581 Restless legs syndrome: Secondary | ICD-10-CM

## 2012-09-15 MED ORDER — ROPINIROLE HCL 1 MG PO TABS: 1.5000 mg | ORAL_TABLET | Freq: Every day | ORAL | Status: DC

## 2012-09-15 NOTE — Assessment & Plan Note (Signed)
The patient has good control of his symptoms on his current dopamine agonist.

## 2012-09-15 NOTE — Addendum Note (Signed)
Addended by: Danella Maiers on: 09/15/2012 11:03 AM   Modules accepted: Orders

## 2012-09-15 NOTE — Progress Notes (Signed)
  Subjective:    Patient ID: Isaac [name], male    DOB: 10-Dec-1955, 57 y.o.   MRN: [id number]  HPI Patient comes in today for followup of his extract of sleep apnea and restless leg syndrome.  He is maintaining on CPAP with a full facemask, and overall is doing fairly well.  He feels that he is sleeping well, with excellent daytime alertness.  His only complaint is occasional mask leaks and fit issues.  He has been staying on his dopamine agonist with good control of his restless leg symptoms.   Review of Systems  Constitutional: Negative for fever and unexpected weight change.  HENT: Positive for sneezing. Negative for ear pain, nosebleeds, congestion, sore throat, rhinorrhea, trouble swallowing, dental problem, postnasal drip and sinus pressure.        Allergies   Eyes: Negative for redness and itching.  Respiratory: Negative for cough, chest tightness, shortness of breath and wheezing.   Cardiovascular: Negative for palpitations and leg swelling.  Gastrointestinal: Negative for nausea and vomiting.  Genitourinary: Negative for dysuria.  Musculoskeletal: Negative for joint swelling.  Skin: Negative for rash.  Neurological: Negative for headaches.  Hematological: Does not bruise/bleed easily.  Psychiatric/Behavioral: Negative for dysphoric mood. The patient is not nervous/anxious.        Objective:   Physical Exam Overweight male in no acute distress Nose without purulence or discharge noted No skin breakdown or pressure necrosis from the CPAP mask Neck without lymphadenopathy or thyromegaly Lower extremities without edema, no cyanosis Alert and oriented, moves all 4 extremities.       Assessment & Plan:

## 2012-09-15 NOTE — Assessment & Plan Note (Signed)
The patient is doing well overall on CPAP, and feels that it is helping his sleep and daytime alertness.  He is having some mask fit issues, but is dealing with it fairly well.  I stressed to him the importance of keeping up with mask changes and supplies, and asked him to work on weight loss as well.

## 2012-09-15 NOTE — Patient Instructions (Addendum)
Will refill your requip Stay on cpap, keep up with mask changes and supplies Work on weight loss followup with me in one year if doing well.

## 2012-09-20 ENCOUNTER — Telehealth: Payer: Self-pay | Admitting: Internal Medicine

## 2012-09-20 NOTE — Telephone Encounter (Signed)
New problem   Pt need to talk to Dr Harrington Challenger about something's but wouldn't say why.Please call pt.

## 2012-09-21 NOTE — Telephone Encounter (Signed)
Pt has question regarding his disability paperwork.  Advised pt to drop paperwork off at office an I will take a look at it.  Pt agreed.

## 2012-09-24 ENCOUNTER — Telehealth: Payer: Self-pay | Admitting: Pulmonary Disease

## 2012-09-24 MED ORDER — ROPINIROLE HCL 1 MG PO TABS: 1.5000 mg | ORAL_TABLET | Freq: Every day | ORAL | Status: DC

## 2012-09-24 NOTE — Telephone Encounter (Signed)
spsoke with patient Patient requesting to fill his requip at this time At last OV 09/15/12 patient had this ordered but placed on hold at [facility] I have contacted pharmacy to make them aware to take off hold and ship medication per patient request Spoke w [place] this has been done , patient aware and nothing further needed at this time

## 2012-12-03 ENCOUNTER — Other Ambulatory Visit: Payer: Self-pay | Admitting: *Deleted

## 2012-12-03 MED ORDER — METOPROLOL SUCCINATE ER 25 MG PO TB24: 25.0000 mg | ORAL_TABLET | Freq: Every evening | ORAL | Status: DC

## 2012-12-03 MED ORDER — PANTOPRAZOLE SODIUM 40 MG PO TBEC: 40.0000 mg | DELAYED_RELEASE_TABLET | Freq: Every morning | ORAL | Status: DC

## 2012-12-09 ENCOUNTER — Encounter: Payer: Self-pay | Admitting: Internal Medicine

## 2012-12-14 ENCOUNTER — Other Ambulatory Visit: Payer: Self-pay | Admitting: Endocrinology

## 2012-12-14 ENCOUNTER — Other Ambulatory Visit (INDEPENDENT_AMBULATORY_CARE_PROVIDER_SITE_OTHER): Payer: Medicare Other

## 2012-12-14 DIAGNOSIS — E785 Hyperlipidemia, unspecified: Secondary | ICD-10-CM

## 2012-12-14 DIAGNOSIS — IMO0001 Reserved for inherently not codable concepts without codable children: Secondary | ICD-10-CM

## 2012-12-14 LAB — COMPREHENSIVE METABOLIC PANEL
ALT: 48 U/L (ref 0–53)
AST: 32 U/L (ref 0–37)
Alkaline Phosphatase: 41 U/L (ref 39–117)
CO2: 27 mEq/L (ref 19–32)
Creatinine, Ser: 1 mg/dL (ref 0.4–1.5)
GFR: 79 mL/min (ref >60.00)
Sodium: 140 mEq/L (ref 135–145)
Total Bilirubin: 0.7 mg/dL (ref 0.3–1.2)
Total Protein: 6.2 g/dL (ref 6.0–8.3)

## 2012-12-14 LAB — LIPID PANEL
LDL Cholesterol: 68 mg/dL (ref 0–99)
Total CHOL/HDL Ratio: 3
Triglycerides: 76 mg/dL (ref 0.0–149.0)
VLDL: 15.2 mg/dL (ref 0.0–40.0)

## 2012-12-14 LAB — URINALYSIS
Hgb urine dipstick: NEGATIVE
Nitrite: NEGATIVE
Urine Glucose: >=1000
Urobilinogen, UA: 0.2 (ref 0.0–1.0)

## 2012-12-14 LAB — MICROALBUMIN / CREATININE URINE RATIO: Microalb, Ur: 0.3 mg/dL (ref 0.0–1.9)

## 2012-12-14 LAB — HEMOGLOBIN A1C: Hgb A1c MFr Bld: 7 % — ABNORMAL HIGH (ref 4.6–6.5)

## 2012-12-16 ENCOUNTER — Ambulatory Visit (INDEPENDENT_AMBULATORY_CARE_PROVIDER_SITE_OTHER): Payer: Medicare Other | Admitting: Endocrinology

## 2012-12-16 ENCOUNTER — Encounter: Payer: Self-pay | Admitting: Endocrinology

## 2012-12-16 VITALS — BP 118/68 | HR 72 | Temp 98.5°F | Resp 12 | Ht 69.0 in | Wt 200.6 lb

## 2012-12-16 DIAGNOSIS — E119 Type 2 diabetes mellitus without complications: Secondary | ICD-10-CM

## 2012-12-16 DIAGNOSIS — E785 Hyperlipidemia, unspecified: Secondary | ICD-10-CM

## 2012-12-16 DIAGNOSIS — I10 Essential (primary) hypertension: Secondary | ICD-10-CM

## 2012-12-16 NOTE — Patient Instructions (Addendum)
Please check blood sugars at least half the time about 2 hours after any meal and as directed on waking up. Please bring blood sugar monitor to each visit  Check dosage of Metformin

## 2012-12-16 NOTE — Progress Notes (Signed)
Patient ID: Isaac [name], male   DOB: 19-Nov-1955, 57 y.o.   MRN: [id number]  Isaac [name] is an 57 y.o. male.   Reason for Appointment: Diabetes follow-up   History of Present Illness   Diagnosis: Type 2 DIABETES MELITUS, long-standing      He has been on various regimens of treatment in the past including mealtime insulin, Victoza and Januvia Previously he has had difficulties tolerating with chores and also large doses of metformin Not clear if he is taking extended release metformin and exact dosage, currently not having diarrhea He appears to have benefited significantly from using Invokana and is not complaining about the cost Also has had some weight loss with this also and no side effects.  He will occasionally have a high postprandial readings which is dependent on his diet but his average very well later in the evening is only about 150  Oral hypoglycemic drugs: Invokana, metformin, Amaryl        Side effects from medications: nausea with Victoza Insulin regimen: none at present          Monitors blood glucose: 1-2 times a day.    Glucometer: One Touch.          Blood Glucose readings from meter download: readings before breakfast:  Median 111 with range 93-154, nonfasting 69-221 and overall median 112 Checking readings mostly late morning and late evening Hypoglycemia frequency: minimal with lowest reading 69 .          Meals: 3 meals per day. he is trying to eat healthy meals recently Physical activity: exercise: some walking, limited by knee joint pain          Dietician visit: Most recent:3-4 years ago          Complications: are: coronary artery disease    Wt Readings from Last 3 Encounters:  12/16/12 200 lb 9.6 oz (90.992 kg)  09/15/12 207 lb (93.895 kg)  04/19/12 206 lb (93.441 kg)    Appointment on 12/14/2012  Component Date Value Range Status  . Hemoglobin A1C 12/14/2012 7.0* 4.6 - 6.5 % Final   Glycemic Control Guidelines for People with Diabetes:Non  Diabetic:  <6%Goal of Therapy: <7%Additional Action Suggested:  >8%   . Sodium 12/14/2012 140  135 - 145 mEq/L Final  . Potassium 12/14/2012 4.5  3.5 - 5.1 mEq/L Final  . Chloride 12/14/2012 107  96 - 112 mEq/L Final  . CO2 12/14/2012 27  19 - 32 mEq/L Final  . Glucose, Bld 12/14/2012 117* 70 - 99 mg/dL Final  . BUN 12/14/2012 17  6 - 23 mg/dL Final  . Creatinine, Ser 12/14/2012 1.0  0.4 - 1.5 mg/dL Final  . Total Bilirubin 12/14/2012 0.7  0.3 - 1.2 mg/dL Final  . Alkaline Phosphatase 12/14/2012 41  39 - 117 U/L Final  . AST 12/14/2012 32  0 - 37 U/L Final  . ALT 12/14/2012 48  0 - 53 U/L Final  . Total Protein 12/14/2012 6.2  6.0 - 8.3 g/dL Final  . Albumin 12/14/2012 3.7  3.5 - 5.2 g/dL Final  . Calcium 12/14/2012 9.1  8.4 - 10.5 mg/dL Final  . GFR 12/14/2012 79.00  >60.00 mL/min Final  . Color, Urine 12/14/2012 LT. YELLOW  Yellow;Lt. Yellow Final  . APPearance 12/14/2012 CLEAR  Clear Final  . Specific Gravity, Urine 12/14/2012 1.025  1.000-1.030 Final  . pH 12/14/2012 6.0  5.0 - 8.0 Final  . Total Protein, Urine 12/14/2012 NEGATIVE  Negative Final  . Urine  Glucose 12/14/2012 >=1000  Negative Final  . Ketones, ur 12/14/2012 NEGATIVE  Negative Final  . Bilirubin Urine 12/14/2012 NEGATIVE  Negative Final  . Hgb urine dipstick 12/14/2012 NEGATIVE  Negative Final  . Urobilinogen, UA 12/14/2012 0.2  0.0 - 1.0 Final  . Leukocytes, UA 12/14/2012 NEGATIVE  Negative Final  . Nitrite 12/14/2012 NEGATIVE  Negative Final  . Microalb, Ur 12/14/2012 0.3  0.0 - 1.9 mg/dL Final  . Creatinine,U 12/14/2012 81.3   Final  . Microalb Creat Ratio 12/14/2012 0.4  0.0 - 30.0 mg/g Final  . Cholesterol 12/14/2012 118  0 - 200 mg/dL Final   ATP III Classification       Desirable:  < 200 mg/dL               Borderline High:  200 - 239 mg/dL          High:  > = 240 mg/dL  . Triglycerides 12/14/2012 76.0  0.0 - 149.0 mg/dL Final   Normal:  <150 mg/dLBorderline High:  150 - 199 mg/dL  . HDL 12/14/2012 35.00*  >39.00 mg/dL Final  . VLDL 12/14/2012 15.2  0.0 - 40.0 mg/dL Final  . LDL Cholesterol 12/14/2012 68  0 - 99 mg/dL Final  . Total CHOL/HDL Ratio 12/14/2012 3   Final                  Men          Women1/2 Average Risk     3.4          3.3Average Risk          5.0          4.42X Average Risk          9.6          7.13X Average Risk          15.0          11.0                          Medication List       This list is accurate as of: 12/16/12 10:48 AM.  Always use your most recent med list.               amLODipine 5 MG tablet  Commonly known as:  NORVASC  Take 5 mg by mouth 2 (two) times daily.     aspirin EC 81 MG tablet  Take 1 tablet (81 mg total) by mouth daily.     CRESTOR 5 MG tablet  Generic drug:  rosuvastatin  Take 5 mg by mouth 3 (three) times a week. Take on M W F     fluorouracil 5 % cream  Commonly known as:  EFUDEX     glimepiride 2 MG tablet  Commonly known as:  AMARYL  Take 2 mg by mouth daily before breakfast.     HUMALOG KWIKPEN 100 UNIT/ML Sopn  Generic drug:  insulin lispro  Inject 6 Units into the skin as needed.     INVOKANA 300 MG Tabs  Generic drug:  Canagliflozin  Take 1 tablet by mouth daily.     isosorbide mononitrate 120 MG 24 hr tablet  Commonly known as:  IMDUR  Take 1 tablet (120 mg total) by mouth every morning.     meloxicam 15 MG tablet  Commonly known as:  MOBIC  15 mg.     metFORMIN 1000 MG tablet  Commonly known as:  GLUCOPHAGE  Take 1,000 mg by mouth 2 (two) times daily with a meal. Take 500 mg in am and takes 1000 mg before evening meal     metoprolol succinate 25 MG 24 hr tablet  Commonly known as:  TOPROL-XL  Take 1 tablet (25 mg total) by mouth every evening.     pantoprazole 40 MG tablet  Commonly known as:  PROTONIX  Take 1 tablet (40 mg total) by mouth every morning.     rOPINIRole 1 MG tablet  Commonly known as:  REQUIP  Take 1.5 tablets (1.5 mg total) by mouth at bedtime. Take 1 tab by mouth at bedtime (along  with the 0.5mg  tab for a total of 1.5mg  daily)        Allergies: No Known Allergies  Past Medical History  Diagnosis Date  . Hypertension   . Dyslipidemia     pt unable to tolerate niaspan  . GERD (gastroesophageal reflux disease)   . Diabetes mellitus     followed by Dr. Dwyane Dee  . History of non-ST elevation myocardial infarction (NSTEMI) 2005  . OSA on CPAP   . Bladder cancer followed by dr Risa Grill  . 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 History of angina CHRONIC -- CONTROLLED W/ IMDUR  . Peripheral neuropathy RIGHT LEG NUMBNESS  . Coronary artery disease     followed by Dr. [name]  . Coronary vasospasm PER DR ROSS NOTE 03-22-2012--  INTER. TIGHTNESS    PER PT PROBABLE FROM STRESS    Past Surgical History  Procedure Laterality Date  . Umbillical hernia repair  2004  . Elbow surgery  07-07-2003    RIGHT ELBOW ARTHROSCOPY W/ OPEN RECONSTRUCTION  . Shoulder surgery  2005    RIGHT ROTATOR CUFF REPAIR  . Cystoscopy with biopsy  04/07/2011    Procedure: CYSTOSCOPY WITH BIOPSY/ RIGHT URETEROSCOPY;  Surgeon: Bernestine Amass, MD;  Location: [facility];  Service: Urology;  Laterality: N/A;    cystoscopy, cystogram, biopsy and fulgeration  . Cysto/ resection bladder tumor/ left retrograde pyelogram/ right ureteroscopy  08-07-2010    TCC OF BLADDER   S/P DISTAL URETERECTOMY/ REIMPLANTATION  . Cysto/ cystogram/ ureteroscopy  02-21-2009  . Right distal ureterectomy w/ reimplantation  10-30-2008    TCC OF RIGHT DISTAL URETER  . Cysto/ right ureteroscopy/ bx ureteral tumor  10-11-2008  . Cysto/ bladder bx  09-13-2008;  08-13-2007; 04-19-2007; 06-01-2006  . Cysto/ bilateral retrograde pyelogram/ right ureteroscopic laser fulguration ureteral tumor  02-16-2008  . Rhinoplasty w/ modified cartilage graft  05-17-2007    INTERNAL NASAL VALVE COLLAPSE/ OSA/ SEPTAL PERFERATION  . Transurethral resection of bladder tumor   01-13-2007  . Appendectomy  08-03-2004  . Knee arthroscopy w/ debridement  02-08-2004    RIGHT KNEE  . Cardiac catheterization  x3  last one 06-17-2007    MILD NON-OBSTRUCTIVE CAD/ NORMAL LVF/ 30% LEFT RENAL ARTERY STENOSIS  . Cystoscopy with biopsy  12/29/2011    Procedure: CYSTOSCOPY WITH BIOPSY;  Surgeon: Bernestine Amass, MD;  Location: [facility];  Service: Urology;  Laterality: N/A;  [place]    . Cystoscopy w/ retrogrades  04/19/2012    Procedure: CYSTOSCOPY WITH RETROGRADE PYELOGRAM;  Surgeon: Bernestine Amass, MD;  Location: [facility];  Service: Urology;  Laterality: Bilateral;  30 MINS Cysto, Biopsy, possible TURBT, Bilateral retrograde pyelograms with Mitomycin C instilation Post op   .  Transurethral resection of bladder tumor  04/19/2012    Procedure: TRANSURETHRAL RESECTION OF BLADDER TUMOR (TURBT);  Surgeon: Bernestine Amass, MD;  Location: [facility];  Service: Urology;  Laterality: N/A;    Family History  Problem Relation Age of Onset  . Coronary artery disease Father   . Colon cancer Neg Hx   . Stomach cancer Neg Hx     Social History:  reports that he quit smoking about 5 years ago. His smoking use included Cigarettes. He has a 10 pack-year smoking history. He has never used smokeless tobacco. He reports that he does not drink alcohol or use illicit drugs.  Review of Systems:  HYPERTENSION:  Very well controlled and is on amlodipine and metoprolol only He is currently not on ACE inhibitor and has normal microalbumin  HYPERLIPIDEMIA: The lipid abnormality consists of elevated LDL and low HDL.  He has had recent knee joint pains and is taking Mobic, may potentially have knee replacement  No recent numbness or tingling in his feet     Examination:   BP 118/68  Pulse 72  Temp(Src) 98.5 F (36.9 C)  Resp 12  Ht 5\' 9"  (1.753 m)  Wt 200 lb 9.6 oz (90.992 kg)  BMI 29.61 kg/m2  SpO2 94%  Body mass index  is 29.61 kg/(m^2).   Diabetic foot exam done  ASSESSMENT/ PLAN::   Diabetes type 2   The patient's diabetes control appears to be overall fairly good with only mild periodic increases in his late evening readings Discussed balanced meals, limiting carbohydrates and high-fat foods He will need to check more readings about 2 hours after meals to help dietary compliance He can try and use a recumbent bike for exercise His A1c is currently well controlled around 7% and he is benefiting from [place] He is trying to be as active as possible but is limited by knee joint pain No changes in medications to be made as yet  His lipids are well controlled with Crestor although HDL is still relatively low. Encouraged more exercise Consider checking an MR with LDL particle number Not clear if he has had increased liver functions from Crestor but these are normal now.  Consider adding low-dose ace inhibitor or an ARB drug, will defer to cardiologist  [facility] 12/16/2012, 10:48 AM

## 2012-12-20 ENCOUNTER — Ambulatory Visit: Payer: Medicare Other | Admitting: Internal Medicine

## 2013-01-20 ENCOUNTER — Ambulatory Visit: Payer: Medicare Other | Admitting: Physician Assistant

## 2013-02-07 ENCOUNTER — Ambulatory Visit: Payer: Medicare Other | Admitting: Internal Medicine

## 2013-02-07 ENCOUNTER — Ambulatory Visit: Payer: Medicare Other | Admitting: Physician Assistant

## 2013-02-08 ENCOUNTER — Ambulatory Visit (INDEPENDENT_AMBULATORY_CARE_PROVIDER_SITE_OTHER): Payer: Medicare Other | Admitting: Physician Assistant

## 2013-02-08 ENCOUNTER — Encounter: Payer: Self-pay | Admitting: Physician Assistant

## 2013-02-08 VITALS — BP 134/78 | HR 60 | Ht 69.0 in | Wt 208.2 lb

## 2013-02-08 DIAGNOSIS — R079 Chest pain, unspecified: Secondary | ICD-10-CM

## 2013-02-08 DIAGNOSIS — I2510 Atherosclerotic heart disease of native coronary artery without angina pectoris: Secondary | ICD-10-CM

## 2013-02-08 DIAGNOSIS — E782 Mixed hyperlipidemia: Secondary | ICD-10-CM

## 2013-02-08 DIAGNOSIS — Z01810 Encounter for preprocedural cardiovascular examination: Secondary | ICD-10-CM

## 2013-02-08 DIAGNOSIS — R55 Syncope and collapse: Secondary | ICD-10-CM

## 2013-02-08 MED ORDER — NITROGLYCERIN 0.4 MG SL SUBL: 0.4000 mg | SUBLINGUAL_TABLET | SUBLINGUAL | Status: DC | PRN

## 2013-02-08 NOTE — Progress Notes (Signed)
[facility]. [address] [place], [place]  [address] Phone: [phone] Fax:  [phone]  Date:  02/08/2013   ID:  Isaac [name], DOB 02-05-1956, MRN [id number]  PCP:  Elby Showers, MD  Cardiologist:  Dr. [name]   History of Present Illness: Isaac [name] is a 57 y.o. male who returns for surgical clearance.  He needs a right knee arthroscopic surgery by Dr. [name].  He has a hx of CAD and probable microvascular ischemia with TIMI-2 flow in the LAD and circumflex, DM2, HL, HTN, bladder CA. He has a prior history of non-STEMI in 2005 thought to be secondary to acute occlusion of the ramus intermediate. Last LHC (2/09): Proximal LAD 30%, RCA with luminal irregularities, EF 60%; left renal artery 30%.  He demonstrated TIMI-2 flow in most of the coronary beds likely secondary to endothelial dysfunction or microvascular disease with a possible component of vasospasm.  Last seen by Dr. Harrington Challenger 03/2012.  Since last seen, he continues to have fairly stable chest tightness relieved by nitroglycerin. He probably can achieve 4 METs.  He denies exertional CP or SOB.  He occasionally gets dizzy if he stands too quickly.  He notes an episode of syncope 3 mos ago.  He was nauseated after eating shellfish and stood to go to the BR.  He passed out with this and denies assoc bowel or bladder incontinence or tongue biting.  He denies orthopnea, PND, edema.    Labs (8/14):  K 4.5, creatinine 1.0, ALT 48, HDL 35, LDL 68  Wt Readings from Last 3 Encounters:  12/16/12 200 lb 9.6 oz (90.992 kg)  09/15/12 207 lb (93.895 kg)  04/19/12 206 lb (93.441 kg)     Past Medical History  Diagnosis Date  . Hypertension   . Dyslipidemia     pt unable to tolerate niaspan  . GERD (gastroesophageal reflux disease)   . Diabetes mellitus     followed by Dr. Dwyane Dee  . History of non-ST elevation myocardial infarction (NSTEMI) 2005  . OSA on CPAP   . Bladder cancer followed by dr Risa Grill  . History of malignant  neoplasm of ureter tcc  s/p ureterotomy w/ reimplantation  . History of angina CHRONIC -- CONTROLLED W/ IMDUR  . Peripheral neuropathy RIGHT LEG NUMBNESS  . Coronary artery disease     followed by Dr. [name]  . Coronary vasospasm PER DR ROSS NOTE 03-22-2012--  INTER. TIGHTNESS    PER PT PROBABLE FROM STRESS    Current Outpatient Prescriptions  Medication Sig Dispense Refill  . amLODipine (NORVASC) 5 MG tablet Take 5 mg by mouth 2 (two) times daily.       Marland Kitchen aspirin EC 81 MG tablet Take 1 tablet (81 mg total) by mouth daily.      . Canagliflozin (INVOKANA) 300 MG TABS Take 1 tablet by mouth daily.      . fluorouracil (EFUDEX) 5 % cream       . glimepiride (AMARYL) 2 MG tablet Take 2 mg by mouth daily before breakfast.      . HUMALOG KWIKPEN 100 UNIT/ML injection Inject 6 Units into the skin as needed.       . isosorbide mononitrate (IMDUR) 120 MG 24 hr tablet Take 1 tablet (120 mg total) by mouth every morning.  90 tablet  3  . meloxicam (MOBIC) 15 MG tablet 15 mg.      . metFORMIN (GLUCOPHAGE) 1000 MG tablet Take 1,000 mg by mouth 2 (two) times daily  with a meal. Take 500 mg in am and takes 1000 mg before evening meal      . metoprolol succinate (TOPROL-XL) 25 MG 24 hr tablet Take 1 tablet (25 mg total) by mouth every evening.  90 tablet  1  . pantoprazole (PROTONIX) 40 MG tablet Take 1 tablet (40 mg total) by mouth every morning.  90 tablet  1  . rOPINIRole (REQUIP) 1 MG tablet Take 1.5 tablets (1.5 mg total) by mouth at bedtime. Take 1 tab by mouth at bedtime (along with the 0.5mg  tab for a total of 1.5mg  daily)  135 tablet  4  . rosuvastatin (CRESTOR) 5 MG tablet Take 5 mg by mouth 3 (three) times a week. Take on M W F  30 tablet  11   No current facility-administered medications for this visit.    Allergies:   No Known Allergies  Social History:  The patient  reports that he quit smoking about 5 years ago. His smoking use included Cigarettes. He has a 10 pack-year smoking  history. He has never used smokeless tobacco. He reports that he does not drink alcohol or use illicit drugs.   ROS:  Please see the history of present illness.      All other systems reviewed and negative.   PHYSICAL EXAM: VS:  BP 134/78  Pulse 60  Ht 5\' 9"  (1.753 m)  Wt 208 lb 3.2 oz (94.439 kg)  BMI 30.73 kg/m2 Well nourished, well developed, in no acute distress HEENT: normal Neck: no JVD Cardiac:  normal S1, S2; RRR; no murmur Lungs:  clear to auscultation bilaterally, no wheezing, rhonchi or rales Abd: soft, nontender, no hepatomegaly Ext: no edema Skin: warm and dry Neuro:  CNs 2-12 intact, no focal abnormalities noted  EKG:  NSR, HR 64, LAD, septal Q waves, nonspecific ST-T wave changes, no change from prior tracing     ASSESSMENT AND PLAN:  1. Surgical Clearance:  It has been 7 years since his last assessment for ischemia.  He has occasional CP that is relieved by NTG that is likely c/w his microvascular angina.  He did have an episode of syncope recently.  However, this is fairly consistent with vasovagal syncope.  He probably can achieve 4 METs.  He is a diabetic.  I reviewed his case with Dr. Minus Breeding (DOD).  We have recommended proceeding with stress testing prior to making a final determination of his risk for surgery.  I will arrange a Lexiscan Myoview (he cannot walk on a treadmill 2/2 knee pain).   2. CAD:  Proceed with Myoview as noted.  Continue ASA, statin. 3. Microvascular Ischemia:  Proceed with myoview.  Continue nitrates, amlodipine. 4. Hypertension:  Controlled.  Continue current therapy.  5. Hyperlipidemia:  Continue statin. 6. Syncope:  Likely vasovagal by his description. ECG is stable.  Will assess EF on Myoview.  If myoview low risk and EF normal, no further workup needed.   7. Disposition:  F/u with Dr. [name] as planned.   Signed, Richardson Dopp, PA-C  02/08/2013 8:48 AM

## 2013-02-08 NOTE — Patient Instructions (Addendum)
An RX for Nitro has been sent to your pharmacy  Your physician recommends that you continue on your current medications as directed. Please refer to the Current Medication list given to you today.  Your physician has requested that you have a lexiscan myoview. For further information please visit [email or web address]. Please follow instruction sheet, as given. (TO BE SCHEDULED ASAP FOR SURGICAL CLEARANCE)

## 2013-02-18 ENCOUNTER — Encounter: Payer: Self-pay | Admitting: Internal Medicine

## 2013-02-22 ENCOUNTER — Ambulatory Visit (HOSPITAL_COMMUNITY): Payer: Medicare Other | Attending: Cardiovascular Disease | Admitting: Radiology

## 2013-02-22 VITALS — BP 118/86 | HR 60 | Ht 69.0 in | Wt 204.0 lb

## 2013-02-22 DIAGNOSIS — R0789 Other chest pain: Secondary | ICD-10-CM | POA: Insufficient documentation

## 2013-02-22 DIAGNOSIS — R079 Chest pain, unspecified: Secondary | ICD-10-CM

## 2013-02-22 DIAGNOSIS — R0609 Other forms of dyspnea: Secondary | ICD-10-CM | POA: Insufficient documentation

## 2013-02-22 DIAGNOSIS — R110 Nausea: Secondary | ICD-10-CM | POA: Insufficient documentation

## 2013-02-22 DIAGNOSIS — R5383 Other fatigue: Secondary | ICD-10-CM | POA: Insufficient documentation

## 2013-02-22 DIAGNOSIS — R0989 Other specified symptoms and signs involving the circulatory and respiratory systems: Secondary | ICD-10-CM | POA: Insufficient documentation

## 2013-02-22 DIAGNOSIS — I2510 Atherosclerotic heart disease of native coronary artery without angina pectoris: Secondary | ICD-10-CM

## 2013-02-22 DIAGNOSIS — R5381 Other malaise: Secondary | ICD-10-CM | POA: Insufficient documentation

## 2013-02-22 DIAGNOSIS — R0602 Shortness of breath: Secondary | ICD-10-CM

## 2013-02-22 DIAGNOSIS — R55 Syncope and collapse: Secondary | ICD-10-CM | POA: Insufficient documentation

## 2013-02-22 MED ORDER — TECHNETIUM TC 99M SESTAMIBI GENERIC - CARDIOLITE
10.0000 | Freq: Once | INTRAVENOUS | Status: AC | PRN
Start: 2013-02-22 — End: 2013-02-22
  Administered 2013-02-22: 10 via INTRAVENOUS

## 2013-02-22 MED ORDER — REGADENOSON 0.4 MG/5ML IV SOLN
0.4000 mg | Freq: Once | INTRAVENOUS | Status: AC
  Administered 2013-02-22: 0.4 mg via INTRAVENOUS

## 2013-02-22 MED ORDER — TECHNETIUM TC 99M SESTAMIBI GENERIC - CARDIOLITE
30.0000 | Freq: Once | INTRAVENOUS | Status: AC | PRN
  Administered 2013-02-22: 30 via INTRAVENOUS

## 2013-02-22 NOTE — Progress Notes (Signed)
[facility] [place], [place] [address] [phone]    Cardiology Nuclear Med Study  Isaac [name] is a 57 y.o. male     MRN : [id number]     DOB: July 13, 1955  Procedure Date: 02/22/2013  Nuclear Med Background Indication for Stress Test:  Evaluation for Ischemia and Pending Surgical Clearance for (R) Arthroscopy by Dr. [name] History: '05 MI; '09 Cath: EF:60% Cardiac Risk Factors: Family History - CAD, History of Smoking, Hypertension, IDDM Type 2 and Lipids  Symptoms:  Chest Tightness with and without Exertion (last episode of chest discomfort was about a week ago), DOE, Fatigue, Nausea, SOB and Syncope was about 39-months ago (did not go to ED)   Nuclear Pre-Procedure Caffeine/Decaff Intake:  None >12 hours NPO After: 11:30pm   Lungs:  Clear. O2 Sat: 96% on room air. IV 0.9% NS with Angio Cath:  22g  IV Site: R Antecubital  IV Started by:  Nyra Market  Chest Size (in):  44 Cup Size: n/a  Height: 5\' 9"  (1.753 m)  Weight:  204 lb (92.534 kg)  BMI:  Body mass index is 30.11 kg/(m^2). Tech Comments:  Held Toprol this a.m., no insulin in a.m.    Nuclear Med Study 1 or 2 day study: 1 day  Stress Test Type:  Lexiscan  Reading MD: Jenkins Rouge, MD  Order Authorizing Provider: Melinda Crutch, MD and Richardson Dopp, MD  Resting Radionuclide: Technetium 83m Sestamibi  Resting Radionuclide Dose: 11.0 mCi   Stress Radionuclide:  Technetium 38m Sestamibi  Stress Radionuclide Dose: 33.0 mCi           Stress Protocol Rest HR: 60 Stress HR: 84  Rest BP: 118/86 Stress BP: 138/99  Exercise Time (min): n/a METS: n/a   Predicted Max HR: 163 bpm % Max HR: 51.53 bpm Rate Pressure Product: 11592   Dose of Adenosine (mg):  n/a Dose of Lexiscan: 0.4 mg  Dose of Atropine (mg): n/a Dose of Dobutamine: n/a mcg/kg/min (at max HR)  Stress Test Technologist: Letta Moynahan, CMA-N  Nuclear Technologist:  Annye Rusk, CNMT     Rest Procedure:   Myocardial perfusion imaging was performed at rest 45 minutes following the intravenous administration of Technetium 30m Sestamibi.  Rest ECG: NSR with non-specific ST-T wave changes  Stress Procedure:  The patient received IV Lexiscan 0.4 mg over 15-seconds.  Technetium 58m Sestamibi injected at 30-seconds. He c/o lightheadedness, headache and nausea with Lexiscan.   Quantitative spect images were obtained after a 45 minute delay.  Stress ECG: No significant change from baseline ECG  QPS Raw Data Images:  There is interference from nuclear activity from structures below the diaphragm. This does not affect the ability to read the study. Stress Images:  There is decreased uptake in the inferior wall. Rest Images:  There is decreased uptake in the inferior wall. Subtraction (SDS):  There is a fixed defect that is most consistent with a previous infarction. Transient Ischemic Dilatation (Normal <1.22):  n/a Lung/Heart Ratio (Normal <0.45):  0.38  Quantitative Gated Spect Images QGS EDV:  104 ml QGS ESV:  45 ml  Impression Exercise Capacity:  Lexiscan with no exercise. BP Response:  Normal blood pressure response. Clinical Symptoms:  Nausea ECG Impression:  No significant ST segment change suggestive of ischemia. Comparison with Prior Nuclear Study: No previous nuclear study performed  Overall Impression:  Low risk stress nuclear study Small inferior wall infarct at mid and basal level.  LV Ejection Fraction: 57%.  LV Wall Motion:  NL LV Function; NL Wall Motion  Jenkins Rouge   .

## 2013-02-23 ENCOUNTER — Encounter: Payer: Self-pay | Admitting: Physician Assistant

## 2013-02-23 ENCOUNTER — Telehealth: Payer: Self-pay | Admitting: Physician Assistant

## 2013-02-23 HISTORY — PX: CARDIOVASCULAR STRESS TEST: SHX262

## 2013-02-23 NOTE — Telephone Encounter (Signed)
New Problem:  Pt states he is returning Carol's call for his test results.

## 2013-02-23 NOTE — Telephone Encounter (Signed)
pt notified about myoview and cleared for surgery. I will fax tomorrow to [employer] at [facility] surg coordinator  . Pt verbalized understanding and said thank you

## 2013-02-23 NOTE — Telephone Encounter (Signed)
pt notified about myoview and cleared for surgery. I will fax tomorrow to [employer] at [facility] surg coordinator  . Pt verbalized understanding and said thank you

## 2013-03-10 ENCOUNTER — Other Ambulatory Visit: Payer: Self-pay | Admitting: Endocrinology

## 2013-03-10 ENCOUNTER — Other Ambulatory Visit: Payer: Medicare Other

## 2013-03-10 LAB — BASIC METABOLIC PANEL
BUN: 18 mg/dL (ref 6–23)
CO2: 27 mEq/L (ref 19–32)
Calcium: 9.4 mg/dL (ref 8.4–10.5)
Creat: 0.91 mg/dL (ref 0.50–1.35)
Glucose, Bld: 122 mg/dL — ABNORMAL HIGH (ref 70–99)
Potassium: 4.3 mEq/L (ref 3.5–5.3)

## 2013-03-10 LAB — HEMOGLOBIN A1C: Mean Plasma Glucose: 151 mg/dL — ABNORMAL HIGH (ref <117)

## 2013-03-17 ENCOUNTER — Encounter: Payer: Self-pay | Admitting: Endocrinology

## 2013-03-17 ENCOUNTER — Other Ambulatory Visit: Payer: Self-pay

## 2013-03-17 ENCOUNTER — Ambulatory Visit (INDEPENDENT_AMBULATORY_CARE_PROVIDER_SITE_OTHER): Payer: Medicare Other | Admitting: Endocrinology

## 2013-03-17 VITALS — BP 154/92 | HR 71 | Temp 98.4°F | Resp 12 | Ht 69.0 in | Wt 206.4 lb

## 2013-03-17 DIAGNOSIS — E119 Type 2 diabetes mellitus without complications: Secondary | ICD-10-CM

## 2013-03-17 DIAGNOSIS — Z23 Encounter for immunization: Secondary | ICD-10-CM

## 2013-03-17 NOTE — Patient Instructions (Signed)
May stop Glimeperide and restart if am sugar > 140

## 2013-03-17 NOTE — Progress Notes (Signed)
Patient ID: Isaac [name], male   DOB: [birth year], 57 y.o.   MRN: [id number]  Isaac [name] is an 57 y.o. male.   Reason for Appointment: Diabetes follow-up   History of Present Illness   Diagnosis: Type 2 DIABETES MELITUS, long-standing      He has been on various regimens of treatment in the past including mealtime insulin, Victoza and Januvia Previously he has had difficulty tolerating Victoza and also large doses of metformin He appears to have benefited significantly from using [place] in 2014 and is tolerating this well Initially had some weight loss with this also  More recently his blood sugars are excellent and only rarely above 150 Amaryl dose is 1 mg at supper  Oral hypoglycemic drugs: Invokana, metformin, Amaryl        Side effects from medications: nausea with Victoza Insulin regimen: Has not taken Humalog lately         Monitors blood glucose: 1-2 times a day.    Glucometer: One Touch.          Blood Glucose readings from meter download: readings before breakfast: 86-141, nonfasting 65-160 Median reading in the morning 118 and at bedtime around 125 Checking readings mostly before breakfast and late evening Hypoglycemia frequency: once with increased physical activity .          Meals: 3 meals per day. he is trying to eat healthy meals usually Physical activity: exercise: some walking, limited by knee joint pain          Dietician visit: Most recent:3-4 years ago          Complications: are: coronary artery disease     Wt Readings from Last 3 Encounters:  03/17/13 206 lb 6.4 oz (93.622 kg)  02/22/13 204 lb (92.534 kg)  02/08/13 208 lb 3.2 oz (94.439 kg)    Lab Results  Component Value Date   HGBA1C 6.9* 03/10/2013   HGBA1C 7.0* 12/14/2012   Lab Results  Component Value Date   MICROALBUR 0.3 12/14/2012   LDLCALC 68 12/14/2012   CREATININE 0.91 03/10/2013     No visits with results within 1 Week(s) from this visit. Latest known visit with results  is:  Orders Only on 03/10/2013  Component Date Value Range Status  . Sodium 03/10/2013 137  135 - 145 mEq/L Final  . Potassium 03/10/2013 4.3  3.5 - 5.3 mEq/L Final  . Chloride 03/10/2013 102  96 - 112 mEq/L Final  . CO2 03/10/2013 27  19 - 32 mEq/L Final  . Glucose, Bld 03/10/2013 122* 70 - 99 mg/dL Final  . BUN 03/10/2013 18  6 - 23 mg/dL Final  . Creat 03/10/2013 0.91  0.50 - 1.35 mg/dL Final  . Calcium 03/10/2013 9.4  8.4 - 10.5 mg/dL Final  . Hemoglobin A1C 03/10/2013 6.9* <5.7 % Final   Comment:                                                                                                 According to the ADA Clinical Practice Recommendations for 2011, when  HbA1c is used as a screening test:                                                       >=6.5%   Diagnostic of Diabetes Mellitus                                     (if abnormal result is confirmed)                                                     5.7-6.4%   Increased risk of developing Diabetes Mellitus                                                     References:Diagnosis and Classification of Diabetes Mellitus,Diabetes                          [id number] 1):S62-S69 and Standards of Medical Care in                                  Diabetes - 2011,Diabetes [id number] (Suppl 1):S11-S61.                             . Mean Plasma Glucose 03/10/2013 151* <117 mg/dL Final      Medication List       This list is accurate as of: 03/17/13 10:16 AM.  Always use your most recent med list.               amLODipine 5 MG tablet  Commonly known as:  NORVASC  Take 5 mg by mouth 2 (two) times daily.     aspirin EC 81 MG tablet  Take 1 tablet (81 mg total) by mouth daily.     CRESTOR 5 MG tablet  Generic drug:  rosuvastatin  Take 5 mg by mouth 3 (three) times a week. Take on M W F     diazepam 10 MG tablet  Commonly known as:  VALIUM  10 mg.     fluorouracil 5 % cream  Commonly  known as:  EFUDEX     glimepiride 2 MG tablet  Commonly known as:  AMARYL  Take 2 mg by mouth daily before breakfast.     HUMALOG KWIKPEN 100 UNIT/ML Sopn  Generic drug:  insulin lispro  Inject 6 Units into the skin as needed.     INVOKANA 300 MG Tabs  Generic drug:  Canagliflozin  Take 1 tablet by mouth daily.     isosorbide mononitrate 120 MG 24 hr tablet  Commonly known as:  IMDUR  Take 1 tablet (120 mg total) by mouth every morning.     meloxicam 15 MG tablet  Commonly known as:  MOBIC  15 mg.     metFORMIN 1000 MG  tablet  Commonly known as:  GLUCOPHAGE  Take 1,000 mg by mouth 2 (two) times daily with a meal. Take 500 mg in am and takes 1000 mg before evening meal     metoprolol succinate 25 MG 24 hr tablet  Commonly known as:  TOPROL-XL  Take 1 tablet (25 mg total) by mouth every evening.     nitroGLYCERIN 0.4 MG SL tablet  Commonly known as:  NITROSTAT  Place 1 tablet (0.4 mg total) under the tongue every 5 (five) minutes as needed for chest pain.     pantoprazole 40 MG tablet  Commonly known as:  PROTONIX  Take 1 tablet (40 mg total) by mouth every morning.     rOPINIRole 1 MG tablet  Commonly known as:  REQUIP  Take 1.5 tablets (1.5 mg total) by mouth at bedtime. Take 1 tab by mouth at bedtime (along with the 0.5mg  tab for a total of 1.5mg  daily)        Allergies: No Known Allergies  Past Medical History  Diagnosis Date  . Hypertension   . Dyslipidemia     pt unable to tolerate niaspan  . GERD (gastroesophageal reflux disease)   . Diabetes mellitus     followed by Dr. Dwyane Dee  . History of non-ST elevation myocardial infarction (NSTEMI) 2005  . OSA on CPAP   . Bladder cancer followed by dr Risa Grill  . 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 History of angina CHRONIC -- CONTROLLED W/ IMDUR  . Peripheral neuropathy RIGHT LEG NUMBNESS  . Coronary artery disease     followed by Dr. [name]; Lexiscan Myoview  (10/14):  Low risk, EF 57%, small inf infarct  . Coronary vasospasm PER DR ROSS NOTE 03-22-2012--  INTER. TIGHTNESS    PER PT PROBABLE FROM STRESS    Past Surgical History  Procedure Laterality Date  . Umbillical hernia repair  2004  . Elbow surgery  07-07-2003    RIGHT ELBOW ARTHROSCOPY W/ OPEN RECONSTRUCTION  . Shoulder surgery  2005    RIGHT ROTATOR CUFF REPAIR  . Cystoscopy with biopsy  04/07/2011    Procedure: CYSTOSCOPY WITH BIOPSY/ RIGHT URETEROSCOPY;  Surgeon: Bernestine Amass, MD;  Location: [facility];  Service: Urology;  Laterality: N/A;    cystoscopy, cystogram, biopsy and fulgeration  . Cysto/ resection bladder tumor/ left retrograde pyelogram/ right ureteroscopy  08-07-2010    TCC OF BLADDER   S/P DISTAL URETERECTOMY/ REIMPLANTATION  . Cysto/ cystogram/ ureteroscopy  02-21-2009  . Right distal ureterectomy w/ reimplantation  10-30-2008    TCC OF RIGHT DISTAL URETER  . Cysto/ right ureteroscopy/ bx ureteral tumor  10-11-2008  . Cysto/ bladder bx  09-13-2008;  08-13-2007; 04-19-2007; 06-01-2006  . Cysto/ bilateral retrograde pyelogram/ right ureteroscopic laser fulguration ureteral tumor  02-16-2008  . Rhinoplasty w/ modified cartilage graft  05-17-2007    INTERNAL NASAL VALVE COLLAPSE/ OSA/ SEPTAL PERFERATION  . Transurethral resection of bladder tumor  01-13-2007  . Appendectomy  08-03-2004  . Knee arthroscopy w/ debridement  02-08-2004    RIGHT KNEE  . Cardiac catheterization  x3  last one 06-17-2007    MILD NON-OBSTRUCTIVE CAD/ NORMAL LVF/ 30% LEFT RENAL ARTERY STENOSIS  . Cystoscopy with biopsy  12/29/2011    Procedure: CYSTOSCOPY WITH BIOPSY;  Surgeon: Bernestine Amass, MD;  Location: [facility];  Service: Urology;  Laterality: N/A;  [place]    . Cystoscopy w/  retrogrades  04/19/2012    Procedure: CYSTOSCOPY WITH RETROGRADE PYELOGRAM;  Surgeon: Bernestine Amass, MD;  Location: [facility];  Service:  Urology;  Laterality: Bilateral;  30 MINS Cysto, Biopsy, possible TURBT, Bilateral retrograde pyelograms with Mitomycin C instilation Post op   . Transurethral resection of bladder tumor  04/19/2012    Procedure: TRANSURETHRAL RESECTION OF BLADDER TUMOR (TURBT);  Surgeon: Bernestine Amass, MD;  Location: [facility];  Service: Urology;  Laterality: N/A;    Family History  Problem Relation Age of Onset  . Coronary artery disease Father   . Colon cancer Neg Hx   . Stomach cancer Neg Hx     Social History:  reports that he quit smoking about 5 years ago. His smoking use included Cigarettes. He has a 10 pack-year smoking history. He has never used smokeless tobacco. He reports that he does not drink alcohol or use illicit drugs.  Review of Systems:  HYPERTENSION:  Very well controlled and is on amlodipine and metoprolol only He is currently not on ACE inhibitor and has normal microalbumin  HYPERLIPIDEMIA: The lipid abnormality consists of elevated LDL and low HDL.  He has had recent knee joint pains and is going to get arthroscopy     Examination:   BP 154/92  Pulse 71  Temp(Src) 98.4 F (36.9 C)  Resp 12  Ht 5\' 9"  (1.753 m)  Wt 206 lb 6.4 oz (93.622 kg)  BMI 30.47 kg/m2  SpO2 97%  Body mass index is 30.47 kg/(m^2).    ASSESSMENT/ PLAN::   Diabetes type 2   The patient's diabetes control appears to be overall excellent with current regimen of Invokana, metformin and low dose Amaryl Because of his tendency to occasional low blood sugars and difficulty losing weight will try to leave off his Amaryl Encouraged him to start exercising once his knee pain is better  Consider adding low-dose ace inhibitor or an ARB drug, will defer to cardiologist  [facility] 03/17/2013, 10:16 AM

## 2013-03-18 DIAGNOSIS — Z23 Encounter for immunization: Secondary | ICD-10-CM

## 2013-03-22 ENCOUNTER — Other Ambulatory Visit: Payer: Self-pay

## 2013-03-22 MED ORDER — AMLODIPINE BESYLATE 5 MG PO TABS: 5.0000 mg | ORAL_TABLET | Freq: Two times a day (BID) | ORAL | Status: DC

## 2013-05-10 ENCOUNTER — Other Ambulatory Visit: Payer: Self-pay | Admitting: *Deleted

## 2013-05-10 MED ORDER — METFORMIN HCL 1000 MG PO TABS: ORAL_TABLET | ORAL | Status: DC

## 2013-05-13 ENCOUNTER — Other Ambulatory Visit: Payer: Self-pay

## 2013-05-13 MED ORDER — PANTOPRAZOLE SODIUM 40 MG PO TBEC: 40.0000 mg | DELAYED_RELEASE_TABLET | Freq: Every morning | ORAL | Status: DC

## 2013-05-13 MED ORDER — METOPROLOL SUCCINATE ER 25 MG PO TB24: 25.0000 mg | ORAL_TABLET | Freq: Every evening | ORAL | Status: DC

## 2013-05-13 MED ORDER — ISOSORBIDE MONONITRATE ER 120 MG PO TB24: 120.0000 mg | ORAL_TABLET | Freq: Every morning | ORAL | Status: DC

## 2013-06-29 ENCOUNTER — Other Ambulatory Visit: Payer: Self-pay | Admitting: *Deleted

## 2013-06-29 MED ORDER — CANAGLIFLOZIN 300 MG PO TABS: 1.0000 | ORAL_TABLET | Freq: Every day | ORAL | Status: DC

## 2013-07-11 ENCOUNTER — Other Ambulatory Visit (INDEPENDENT_AMBULATORY_CARE_PROVIDER_SITE_OTHER): Payer: Medicare Other

## 2013-07-11 DIAGNOSIS — E119 Type 2 diabetes mellitus without complications: Secondary | ICD-10-CM

## 2013-07-11 LAB — COMPREHENSIVE METABOLIC PANEL
ALT: 57 U/L — ABNORMAL HIGH (ref 0–53)
AST: 35 U/L (ref 0–37)
Albumin: 3.8 g/dL (ref 3.5–5.2)
Alkaline Phosphatase: 41 U/L (ref 39–117)
BUN: 11 mg/dL (ref 6–23)
CO2: 28 mEq/L (ref 19–32)
Calcium: 9 mg/dL (ref 8.4–10.5)
Chloride: 105 mEq/L (ref 96–112)
Creatinine, Ser: 1 mg/dL (ref 0.4–1.5)
GFR: 82.53 mL/min (ref >60.00)
Glucose, Bld: 145 mg/dL — ABNORMAL HIGH (ref 70–99)
Potassium: 4.3 mEq/L (ref 3.5–5.1)
Sodium: 138 mEq/L (ref 135–145)
Total Bilirubin: 0.8 mg/dL (ref 0.3–1.2)
Total Protein: 6.5 g/dL (ref 6.0–8.3)

## 2013-07-11 LAB — LIPID PANEL
Cholesterol: 97 mg/dL (ref 0–200)
HDL: 31.3 mg/dL — ABNORMAL LOW (ref >39.00)
LDL Cholesterol: 42 mg/dL (ref 0–99)
Total CHOL/HDL Ratio: 3
Triglycerides: 118 mg/dL (ref 0.0–149.0)
VLDL: 23.6 mg/dL (ref 0.0–40.0)

## 2013-07-11 LAB — HEMOGLOBIN A1C: Hgb A1c MFr Bld: 8.4 % — ABNORMAL HIGH (ref 4.6–6.5)

## 2013-07-11 LAB — MICROALBUMIN / CREATININE URINE RATIO
Creatinine,U: 89.3 mg/dL
MICROALB UR: 1.3 mg/dL (ref 0.0–1.9)
Microalb Creat Ratio: 1.5 mg/g (ref 0.0–30.0)

## 2013-07-15 ENCOUNTER — Ambulatory Visit (INDEPENDENT_AMBULATORY_CARE_PROVIDER_SITE_OTHER): Payer: Medicare Other | Admitting: Endocrinology

## 2013-07-15 ENCOUNTER — Encounter: Payer: Self-pay | Admitting: Endocrinology

## 2013-07-15 VITALS — BP 118/80 | HR 73 | Temp 98.5°F | Resp 16 | Ht 69.0 in | Wt 207.2 lb

## 2013-07-15 DIAGNOSIS — E785 Hyperlipidemia, unspecified: Secondary | ICD-10-CM

## 2013-07-15 DIAGNOSIS — IMO0001 Reserved for inherently not codable concepts without codable children: Secondary | ICD-10-CM

## 2013-07-15 DIAGNOSIS — E1165 Type 2 diabetes mellitus with hyperglycemia: Principal | ICD-10-CM

## 2013-07-15 MED ORDER — GLIMEPIRIDE 2 MG PO TABS: 2.0000 mg | ORAL_TABLET | Freq: Every day | ORAL | Status: DC

## 2013-07-15 NOTE — Patient Instructions (Signed)
Amaryl 2 mg before dinner; if am sugars > 140 then call  Low carb and low fat meals and snacks, no sodas

## 2013-07-15 NOTE — Progress Notes (Signed)
Patient ID: Isaac [name], male   DOB: [birth year], 58 y.o.   MRN: [id number]   Reason for Appointment: Diabetes follow-up   History of Present Illness   Diagnosis: Type 2 DIABETES MELITUS, long-standing      He has been on various regimens of treatment in the past including mealtime insulin, Victoza and Januvia Previously he has had difficulty tolerating Victoza and also large doses of metformin He appears to have benefited  from using [place] in 2014 with improved control and some weight loss Subsequently with using low dose Amaryl he was still getting some hypoglycemia Recent history: Although his blood sugars are excellent in 10/14 and he was told to stop his Amaryl his blood sugars have been much higher recently He thinks that this is mostly related to periodic injections of steroid in his knees However his blood sugars are still significantly high even though he has not had a steroid injection to 3 weeks Has several high readings even fasting Also appears to be not consistent with diet or watching simple sugars  Oral hypoglycemic drugs: Invokana, metformin, Amaryl        Side effects from medications: nausea with Victoza Insulin regimen: Has not taken Humalog lately         Monitors blood glucose: 1-2 times a day.    Glucometer: One Touch.          Blood Glucose readings from meter download:   PREMEAL Breakfast  midday   6-9 PM  Bedtime Overall  Glucose range:  119-213   133, 174   114-208   201-268    Mean/median:  171      174   Checking readings mostly before breakfast and late evening Hypoglycemia: None .          Meals: 3 meals per day.  He is occasionally drinking regular soft drinks  Physical activity: exercise: no walking, limited by knee joint pain          Dietician visit: Most recent:3-4 years ago          Complications: are: coronary artery disease     Wt Readings from Last 3 Encounters:  07/15/13 207 lb 3.2 oz (93.985 kg)  03/17/13 206 lb 6.4 oz (93.622  kg)  02/22/13 204 lb (92.534 kg)    Lab Results  Component Value Date   HGBA1C 8.4* 07/11/2013   HGBA1C 6.9* 03/10/2013   HGBA1C 7.0* 12/14/2012   Lab Results  Component Value Date   MICROALBUR 1.3 07/11/2013   LDLCALC 42 07/11/2013   CREATININE 1.0 07/11/2013     Appointment on 07/11/2013  Component Date Value Ref Range Status  . Hemoglobin A1C 07/11/2013 8.4* 4.6 - 6.5 % Final   Glycemic Control Guidelines for People with Diabetes:Non Diabetic:  <6%Goal of Therapy: <7%Additional Action Suggested:  >8%   . Sodium 07/11/2013 138  135 - 145 mEq/L Final  . Potassium 07/11/2013 4.3  3.5 - 5.1 mEq/L Final  . Chloride 07/11/2013 105  96 - 112 mEq/L Final  . CO2 07/11/2013 28  19 - 32 mEq/L Final  . Glucose, Bld 07/11/2013 145* 70 - 99 mg/dL Final  . BUN 07/11/2013 11  6 - 23 mg/dL Final  . Creatinine, Ser 07/11/2013 1.0  0.4 - 1.5 mg/dL Final  . Total Bilirubin 07/11/2013 0.8  0.3 - 1.2 mg/dL Final  . Alkaline Phosphatase 07/11/2013 41  39 - 117 U/L Final  . AST 07/11/2013 35  0 - 37 U/L Final  .  ALT 07/11/2013 57* 0 - 53 U/L Final  . Total Protein 07/11/2013 6.5  6.0 - 8.3 g/dL Final  . Albumin 07/11/2013 3.8  3.5 - 5.2 g/dL Final  . Calcium 07/11/2013 9.0  8.4 - 10.5 mg/dL Final  . GFR 07/11/2013 82.53  >60.00 mL/min Final  . Cholesterol 07/11/2013 97  0 - 200 mg/dL Final   ATP III Classification       Desirable:  < 200 mg/dL               Borderline High:  200 - 239 mg/dL          High:  > = 240 mg/dL  . Triglycerides 07/11/2013 118.0  0.0 - 149.0 mg/dL Final   Normal:  <150 mg/dLBorderline High:  150 - 199 mg/dL  . HDL 07/11/2013 31.30* >39.00 mg/dL Final  . VLDL 07/11/2013 23.6  0.0 - 40.0 mg/dL Final  . LDL Cholesterol 07/11/2013 42  0 - 99 mg/dL Final  . Total CHOL/HDL Ratio 07/11/2013 3   Final                  Men          Women1/2 Average Risk     3.4          3.3Average Risk          5.0          4.42X Average Risk          9.6          7.13X Average Risk          15.0           11.0                      . Microalb, Ur 07/11/2013 1.3  0.0 - 1.9 mg/dL Final  . Creatinine,U 07/11/2013 89.3   Final  . Microalb Creat Ratio 07/11/2013 1.5  0.0 - 30.0 mg/g Final      Medication List       This list is accurate as of: 07/15/13 10:59 AM.  Always use your most recent med list.               amLODipine 5 MG tablet  Commonly known as:  NORVASC  Take 1 tablet (5 mg total) by mouth 2 (two) times daily.     aspirin EC 81 MG tablet  Take 1 tablet (81 mg total) by mouth daily.     Canagliflozin 300 MG Tabs  Commonly known as:  INVOKANA  Take 1 tablet (300 mg total) by mouth daily.     CRESTOR 5 MG tablet  Generic drug:  rosuvastatin  Take 5 mg by mouth 3 (three) times a week. Take on M W F     diazepam 10 MG tablet  Commonly known as:  VALIUM  10 mg.     fluorouracil 5 % cream  Commonly known as:  EFUDEX     HUMALOG KWIKPEN 100 UNIT/ML KiwkPen  Generic drug:  insulin lispro  Inject 6 Units into the skin as needed.     isosorbide mononitrate 120 MG 24 hr tablet  Commonly known as:  IMDUR  Take 1 tablet (120 mg total) by mouth every morning.     meloxicam 15 MG tablet  Commonly known as:  MOBIC  15 mg.     metFORMIN 1000 MG tablet  Commonly known as:  GLUCOPHAGE  Takes 500 mg in  am and takes 1000 mg before evening meal     metoprolol succinate 25 MG 24 hr tablet  Commonly known as:  TOPROL-XL  Take 1 tablet (25 mg total) by mouth every evening.     nitroGLYCERIN 0.4 MG SL tablet  Commonly known as:  NITROSTAT  Place 1 tablet (0.4 mg total) under the tongue every 5 (five) minutes as needed for chest pain.     pantoprazole 40 MG tablet  Commonly known as:  PROTONIX  Take 1 tablet (40 mg total) by mouth every morning.     rOPINIRole 1 MG tablet  Commonly known as:  REQUIP  Take 1.5 tablets (1.5 mg total) by mouth at bedtime. Take 1 tab by mouth at bedtime (along with the 0.5mg  tab for a total of 1.5mg  daily)        Allergies: No Known  Allergies  Past Medical History  Diagnosis Date  . Hypertension   . Dyslipidemia     pt unable to tolerate niaspan  . GERD (gastroesophageal reflux disease)   . Diabetes mellitus     followed by Dr. Dwyane Dee  . History of non-ST elevation myocardial infarction (NSTEMI) 2005  . OSA on CPAP   . Bladder cancer followed by dr Risa Grill  . 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 History of angina CHRONIC -- CONTROLLED W/ IMDUR  . Peripheral neuropathy RIGHT LEG NUMBNESS  . Coronary artery disease     followed by Dr. [name]; Lexiscan Myoview (10/14):  Low risk, EF 57%, small inf infarct  . Coronary vasospasm PER DR ROSS NOTE 03-22-2012--  INTER. TIGHTNESS    PER PT PROBABLE FROM STRESS    Past Surgical History  Procedure Laterality Date  . Umbillical hernia repair  2004  . Elbow surgery  07-07-2003    RIGHT ELBOW ARTHROSCOPY W/ OPEN RECONSTRUCTION  . Shoulder surgery  2005    RIGHT ROTATOR CUFF REPAIR  . Cystoscopy with biopsy  04/07/2011    Procedure: CYSTOSCOPY WITH BIOPSY/ RIGHT URETEROSCOPY;  Surgeon: Bernestine Amass, MD;  Location: [facility];  Service: Urology;  Laterality: N/A;    cystoscopy, cystogram, biopsy and fulgeration  . Cysto/ resection bladder tumor/ left retrograde pyelogram/ right ureteroscopy  08-07-2010    TCC OF BLADDER   S/P DISTAL URETERECTOMY/ REIMPLANTATION  . Cysto/ cystogram/ ureteroscopy  02-21-2009  . Right distal ureterectomy w/ reimplantation  10-30-2008    TCC OF RIGHT DISTAL URETER  . Cysto/ right ureteroscopy/ bx ureteral tumor  10-11-2008  . Cysto/ bladder bx  09-13-2008;  08-13-2007; 04-19-2007; 06-01-2006  . Cysto/ bilateral retrograde pyelogram/ right ureteroscopic laser fulguration ureteral tumor  02-16-2008  . Rhinoplasty w/ modified cartilage graft  05-17-2007    INTERNAL NASAL VALVE COLLAPSE/ OSA/ SEPTAL PERFERATION  . Transurethral resection of bladder tumor  01-13-2007  .  Appendectomy  08-03-2004  . Knee arthroscopy w/ debridement  02-08-2004    RIGHT KNEE  . Cardiac catheterization  x3  last one 06-17-2007    MILD NON-OBSTRUCTIVE CAD/ NORMAL LVF/ 30% LEFT RENAL ARTERY STENOSIS  . Cystoscopy with biopsy  12/29/2011    Procedure: CYSTOSCOPY WITH BIOPSY;  Surgeon: Bernestine Amass, MD;  Location: [facility];  Service: Urology;  Laterality: N/A;  [place]    . Cystoscopy w/ retrogrades  04/19/2012    Procedure: CYSTOSCOPY WITH RETROGRADE PYELOGRAM;  Surgeon: Bernestine Amass, MD;  Location: [facility];  Service: Urology;  Laterality: Bilateral;  30 MINS Cysto, Biopsy, possible TURBT, Bilateral retrograde pyelograms with Mitomycin C instilation Post op   . Transurethral resection of bladder tumor  04/19/2012    Procedure: TRANSURETHRAL RESECTION OF BLADDER TUMOR (TURBT);  Surgeon: Bernestine Amass, MD;  Location: [facility];  Service: Urology;  Laterality: N/A;    Family History  Problem Relation Age of Onset  . Coronary artery disease Father   . Colon cancer Neg Hx   . Stomach cancer Neg Hx     Social History:  reports that he quit smoking about 6 years ago. His smoking use included Cigarettes. He has a 10 pack-year smoking history. He has never used smokeless tobacco. He reports that he does not drink alcohol or use illicit drugs.  Review of Systems:  HYPERTENSION:  Very well controlled and is on amlodipine and metoprolol only He is currently not on ACE inhibitor and has normal microalbumin  HYPERLIPIDEMIA: The lipid abnormality consists of elevated LDL and low HDL, HDL is still low.  He has had recent knee joint pains and is  not benefiting from multiple steroid injections      Examination:   BP 118/80  Pulse 73  Temp(Src) 98.5 F (36.9 C)  Resp 16  Ht 5\' 9"  (1.753 m)  Wt 207 lb 3.2 oz (93.985 kg)  BMI 30.58 kg/m2  SpO2 96%  Body mass index is 30.58 kg/(m^2).    ASSESSMENT/ PLAN::    Diabetes type 2   The patient's diabetes control appears to bemuch worse with current regimen of Invokana, metformin   Reason for worsening control are as follows:  Stopping Amaryl although this was only 1 mg dose  Inconsistent diet and also use of simple sugars  Inability to exercise  Some weight gain   Recommendations made today:  Start Amaryl 2 mg at dinnertime and consider increasing this if needed  If blood sugars are not improved he will need to add either basal or mealtime insulin. Discussed need for more consistent glucose monitoring at various times including after meals. Discussed blood sugar targets to be at least under 140 fasting and 180 post prandial. Explained the action of Humalog and Lantus for mealtime and overnight control respectively  Improve diet and not consume any regular soft drinks  Exercise when able to; I recommended water aerobics  Short-term followup in one month   Counseling time over 50% of today's 25 minute visit  Cynethia Schindler 07/15/2013, 10:59 AM

## 2013-08-12 ENCOUNTER — Ambulatory Visit: Payer: Medicare Other | Admitting: Endocrinology

## 2013-09-05 ENCOUNTER — Telehealth: Payer: Self-pay | Admitting: Gastroenterology

## 2013-09-05 NOTE — Telephone Encounter (Signed)
Left message for patient to call back  

## 2013-09-06 NOTE — Telephone Encounter (Signed)
Patient with GERD and nausea.  He would like to be seen earlier than 6/9.  He will come in and see Nicoletta Ba PA  tomorrow at 11:00

## 2013-09-07 ENCOUNTER — Ambulatory Visit (INDEPENDENT_AMBULATORY_CARE_PROVIDER_SITE_OTHER): Payer: Medicare Other | Admitting: Physician Assistant

## 2013-09-07 ENCOUNTER — Encounter: Payer: Self-pay | Admitting: Physician Assistant

## 2013-09-07 VITALS — BP 130/82 | HR 68 | Ht 69.0 in | Wt 205.4 lb

## 2013-09-07 DIAGNOSIS — R140 Abdominal distension (gaseous): Secondary | ICD-10-CM

## 2013-09-07 DIAGNOSIS — R142 Eructation: Secondary | ICD-10-CM

## 2013-09-07 DIAGNOSIS — R143 Flatulence: Secondary | ICD-10-CM

## 2013-09-07 DIAGNOSIS — Z86010 Personal history of colonic polyps: Secondary | ICD-10-CM

## 2013-09-07 DIAGNOSIS — R197 Diarrhea, unspecified: Secondary | ICD-10-CM

## 2013-09-07 DIAGNOSIS — R141 Gas pain: Secondary | ICD-10-CM

## 2013-09-07 MED ORDER — SACCHAROMYCES BOULARDII 250 MG PO CAPS: 250.0000 mg | ORAL_CAPSULE | Freq: Two times a day (BID) | ORAL | Status: DC

## 2013-09-07 NOTE — Progress Notes (Signed)
Subjective:    Patient ID: Isaac [name], male    DOB: [birth year], 58 y.o.   MRN: [id number]  HPI 58 year old white male known to Dr.Stark, who comes in today because of a change in bowel habits with loose more frequent stools and complaints of abdominal bloating gas and occasional nausea. He has history of adult-onset diabetes mellitus is on several medications for diabetes, also has coronary artery disease, hypertension, restless leg syndrome, sleep apnea, anxiety, and history of bladder cancer. He had undergone colonoscopy in October of 2013 was noted to have mild diverticulosis and 2 small sessile polyps in the sigmoid colon were removed. Biopsies were consistent with hyperplastic polyps and he was recommended for 10 year interval followup. He does have some mild chronic GERD symptoms, and has been on Protonix for several years. He says he is not having any regular heartburn or indigestion no dysphagia or diet aphasia. He has no early satiety symptoms. He says after he does occasionally he gets a lot of gurgling and nausea at times without vomiting. His appetite has been fine, his weight has been stable. He's also had looser stools for the past several months and is having 4-5 bowel movements per day. Says this is been associated with some gas and bloating. No melena or hematochezia. His only new medication was restarting Glimepride  about 3 months ago. No recent antibiotics or other changes in meds.     Review of Systems  Constitutional: Negative.   HENT: Negative.   Eyes: Negative.   Respiratory: Negative.   Cardiovascular: Negative.   Gastrointestinal: Positive for nausea, diarrhea and abdominal distention.  Endocrine: Negative.   Genitourinary: Negative.   Musculoskeletal: Negative.   Allergic/Immunologic: Negative.   Neurological: Negative.   Hematological: Negative.   Psychiatric/Behavioral: Negative.    Outpatient Prescriptions Prior to Visit  Medication Sig Dispense Refill   . amLODipine (NORVASC) 5 MG tablet Take 1 tablet (5 mg total) by mouth 2 (two) times daily.  180 tablet  3  . aspirin EC 81 MG tablet Take 1 tablet (81 mg total) by mouth daily.      . Canagliflozin (INVOKANA) 300 MG TABS Take 1 tablet (300 mg total) by mouth daily.  90 tablet  1  . diazepam (VALIUM) 10 MG tablet 10 mg.      . fluorouracil (EFUDEX) 5 % cream       . glimepiride (AMARYL) 2 MG tablet Take 1 tablet (2 mg total) by mouth daily before supper.  90 tablet  3  . HUMALOG KWIKPEN 100 UNIT/ML injection Inject 6 Units into the skin as needed.       . isosorbide mononitrate (IMDUR) 120 MG 24 hr tablet Take 1 tablet (120 mg total) by mouth every morning.  90 tablet  3  . metFORMIN (GLUCOPHAGE) 1000 MG tablet Takes 500 mg in am and takes 1000 mg before evening meal  270 tablet  1  . metoprolol succinate (TOPROL-XL) 25 MG 24 hr tablet Take 1 tablet (25 mg total) by mouth every evening.  90 tablet  1  . nitroGLYCERIN (NITROSTAT) 0.4 MG SL tablet Place 1 tablet (0.4 mg total) under the tongue every 5 (five) minutes as needed for chest pain.  25 tablet  11  . pantoprazole (PROTONIX) 40 MG tablet Take 1 tablet (40 mg total) by mouth every morning.  90 tablet  1  . rOPINIRole (REQUIP) 1 MG tablet Take 1.5 tablets (1.5 mg total) by mouth at bedtime. Take 1 tab  by mouth at bedtime (along with the 0.5mg  tab for a total of 1.5mg  daily)  135 tablet  4  . meloxicam (MOBIC) 15 MG tablet 15 mg.      . rosuvastatin (CRESTOR) 5 MG tablet Take 5 mg by mouth 3 (three) times a week. Take on M W F  30 tablet  11   No facility-administered medications prior to visit.   No Known Allergies Patient Active Problem List   Diagnosis Date Noted  . Personal history of colonic polyps 09/07/2013  . Type II or unspecified type diabetes mellitus without mention of complication, not stated as uncontrolled 06/19/2011  . Anxiety and depression 06/19/2011  . History of skin cancer 06/19/2011  . BLADDER CANCER , UNSPEC.  05/10/2008  . HYPERLIPIDEMIA-MIXED 05/10/2008  . CAD, NATIVE VESSEL 05/10/2008  . RESTLESS LEGS SYNDROME 11/17/2007  . HYPERTENSION 11/16/2007  . OSA (obstructive sleep apnea) 11/16/2007   History  Substance Use Topics  . Smoking status: Former Smoker -- 0.50 packs/day for 20 years    Types: Cigarettes    Quit date: 05/13/2007  . Smokeless tobacco: Never Used  . Alcohol Use: No   family history includes Coronary artery disease in his father; Diabetes in his maternal grandmother; Heart disease in his paternal grandfather. There is no history of Colon cancer or Stomach cancer.     Objective:   Physical Exam  obese white male in no acute distress blood pressure 130/82 pulse 68 ,height 5 foot 9 weight 205 BMI 30.Marland Kitchen HEENT; nontraumatic, normocephalic ,EOMI PERRLA sclera anicteric, Supple; no JVD, Cardiovascular; regular rate and rhythm with S1-S2 no murmur or gallop, Pulmonary; clear bilaterally, Abdomen; soft there is no focal tenderness but he does have some minimal tenderness in the lower abdomen suprapubic area no palpable mass or hepatosplenomegaly no guarding or rebound bowel sounds are present. He does have a low midline incisional scar, Rectal ;exam not done, Extremities; no clubbing cyanosis or edema skin warm and dry, Psych; mood and affect normal and appropriate        Assessment & Plan:  #86  58 year old male diabetic with 3-4 month history of more frequent looser stools abdominal bloating gas and intermittent nausea . Etiology not clear he may have small bowel bacterial overgrowth, he is also on multiple medications which may be contributing to his GI symptoms #2 chronic GERD #3 history of hyperplastic colon polyps last colonoscopy October 2013 #4 diverticulosis #5 history of bladder cancer #6 sleep apnea #7 coronary artery disease #8 restless leg syndrome  Plan; Will give him an empiric trial of Xifaxan 550 mg by mouth twice daily x7 days for small bowel bacterial overgrowth  and also start him on Florastor  one by mouth daily for the next month If symptoms are persisting after he finishes the Xifaxan he may try Imodium each morning He will call if he does not have improvement in his symptoms with the above regimen and will need followup with Dr. [name] or myself.

## 2013-09-07 NOTE — Patient Instructions (Addendum)
We have given you samples of Xifaxan antibiotic, Take 1 tab twice daily with food for 7 days. After you finish taking the antibiotic, Xifaxan, if you still have loose stools, you can take Imodium 1 tab daily until stools are normal.  Call also for a follow up with Dr. [name] or Nicoletta Ba PA-C if your problem persists.  Take Florastor, Probiotic,  1 tab daily for 1 month.  You can get this at the pharmacy.   We gave you a coupon for Restora also which is another probiotic.  You can get this at [facility] also.

## 2013-09-07 NOTE — Progress Notes (Signed)
Reviewed and agree with management plan.  Aeron Lheureux T. Daily Crate, MD FACG 

## 2013-09-14 ENCOUNTER — Ambulatory Visit: Payer: Medicare Other | Admitting: Pulmonary Disease

## 2013-09-27 ENCOUNTER — Other Ambulatory Visit (INDEPENDENT_AMBULATORY_CARE_PROVIDER_SITE_OTHER): Payer: Medicare Other

## 2013-09-27 ENCOUNTER — Other Ambulatory Visit: Payer: Self-pay | Admitting: *Deleted

## 2013-09-27 DIAGNOSIS — E119 Type 2 diabetes mellitus without complications: Secondary | ICD-10-CM

## 2013-09-27 LAB — LIPID PANEL
Cholesterol: 108 mg/dL (ref 0–200)
HDL: 31.6 mg/dL — ABNORMAL LOW (ref >39.00)
LDL Cholesterol: 57 mg/dL (ref 0–99)
Total CHOL/HDL Ratio: 3
Triglycerides: 97 mg/dL (ref 0.0–149.0)
VLDL: 19.4 mg/dL (ref 0.0–40.0)

## 2013-09-27 LAB — COMPREHENSIVE METABOLIC PANEL
ALT: 77 U/L — ABNORMAL HIGH (ref 0–53)
AST: 46 U/L — AB (ref 0–37)
Albumin: 3.9 g/dL (ref 3.5–5.2)
Alkaline Phosphatase: 41 U/L (ref 39–117)
BUN: 16 mg/dL (ref 6–23)
CALCIUM: 9.1 mg/dL (ref 8.4–10.5)
CO2: 28 mEq/L (ref 19–32)
Chloride: 102 mEq/L (ref 96–112)
Creatinine, Ser: 1.2 mg/dL (ref 0.4–1.5)
GFR: 68.68 mL/min (ref >60.00)
Glucose, Bld: 130 mg/dL — ABNORMAL HIGH (ref 70–99)
Potassium: 4 mEq/L (ref 3.5–5.1)
Sodium: 137 mEq/L (ref 135–145)
Total Bilirubin: 0.5 mg/dL (ref 0.2–1.2)
Total Protein: 6.6 g/dL (ref 6.0–8.3)

## 2013-09-27 LAB — HEMOGLOBIN A1C: Hgb A1c MFr Bld: 7.7 % — ABNORMAL HIGH (ref 4.6–6.5)

## 2013-09-28 ENCOUNTER — Other Ambulatory Visit: Payer: Self-pay

## 2013-09-28 MED ORDER — PANTOPRAZOLE SODIUM 40 MG PO TBEC: 40.0000 mg | DELAYED_RELEASE_TABLET | Freq: Every morning | ORAL | Status: DC

## 2013-09-28 MED ORDER — METOPROLOL SUCCINATE ER 25 MG PO TB24: 25.0000 mg | ORAL_TABLET | Freq: Every evening | ORAL | Status: DC

## 2013-09-30 ENCOUNTER — Ambulatory Visit: Payer: Medicare Other | Admitting: Endocrinology

## 2013-10-05 ENCOUNTER — Ambulatory Visit (INDEPENDENT_AMBULATORY_CARE_PROVIDER_SITE_OTHER): Payer: Medicare Other | Admitting: Endocrinology

## 2013-10-05 ENCOUNTER — Encounter: Payer: Self-pay | Admitting: Endocrinology

## 2013-10-05 VITALS — BP 124/72 | HR 71 | Temp 98.2°F | Resp 14 | Ht 69.0 in | Wt 201.6 lb

## 2013-10-05 DIAGNOSIS — E119 Type 2 diabetes mellitus without complications: Secondary | ICD-10-CM

## 2013-10-05 NOTE — Patient Instructions (Addendum)
Please check blood sugars at least half the time about 2 hours after any meal and as directed on waking up. Please bring blood sugar monitor to each visit  If sugar frequently < 80 may stop Amaryl; double the dose with steroids

## 2013-10-05 NOTE — Progress Notes (Signed)
Patient ID: Isaac [name], male   DOB: 1955-08-01, 58 y.o.   MRN: [id number]   Reason for Appointment: Diabetes follow-up   History of Present Illness   Diagnosis: Type 2 DIABETES MELITUS, long-standing      He has been on various regimens of treatment in the past including mealtime insulin, Victoza and Januvia Previously he has had difficulty tolerating Victoza and also large doses of metformin He appears to have benefited  from using Invokana in 2014 with improved control and some weight loss Subsequently with using low dose Amaryl he was still getting some hypoglycemia; in 10/14  he was told to stop his Amaryl  Recent history: Because of relatively high readings on his visit in 3/15 especially late evening and fasting he was told to resume Amaryl, half of the 2 mg tablet at suppertime With this his blood sugars have improved significantly although his A1c is still not at target He may have had some high readings with getting a steroid shot in his knee about a month ago; he did not increase his Amaryl for this Most of his high readings are in the evenings because of variability in his diet  Oral hypoglycemic drugs: Invokana, metformin, Amaryl 1 mg        Side effects from medications: nausea with Victoza Insulin: None         Monitors blood glucose: 1-2 times a day.    Glucometer: One Touch.          Blood Glucose readings from meter download:   PREMEAL Breakfast Lunch Dinner  PCS  Overall  Glucose range:  87-127    97   93-215   87-215   Mean/median:     116   Hypoglycemia: None documented although he may feel shaky when blood sugar is below 90 .          Meals: 2- 3 meals per day.  He is rarely drinking regular soft drinks  Physical activity: exercise: some gardening/ walking, limited by knee joint pain          Dietician visit: Most recent:3-4 years ago          Complications: are: coronary artery disease     Wt Readings from Last 3 Encounters:  10/05/13 201 lb 9.6 oz  (91.445 kg)  09/07/13 205 lb 6.4 oz (93.169 kg)  07/15/13 207 lb 3.2 oz (93.985 kg)    Lab Results  Component Value Date   HGBA1C 7.7* 09/27/2013   HGBA1C 8.4* 07/11/2013   HGBA1C 6.9* 03/10/2013   Lab Results  Component Value Date   MICROALBUR 1.3 07/11/2013   LDLCALC 57 09/27/2013   CREATININE 1.2 09/27/2013     No visits with results within 1 Week(s) from this visit. Latest known visit with results is:  Appointment on 09/27/2013  Component Date Value Ref Range Status  . Hemoglobin A1C 09/27/2013 7.7* 4.6 - 6.5 % Final   Glycemic Control Guidelines for People with Diabetes:Non Diabetic:  <6%Goal of Therapy: <7%Additional Action Suggested:  >8%   . Sodium 09/27/2013 137  135 - 145 mEq/L Final  . Potassium 09/27/2013 4.0  3.5 - 5.1 mEq/L Final  . Chloride 09/27/2013 102  96 - 112 mEq/L Final  . CO2 09/27/2013 28  19 - 32 mEq/L Final  . Glucose, Bld 09/27/2013 130* 70 - 99 mg/dL Final  . BUN 09/27/2013 16  6 - 23 mg/dL Final  . Creatinine, Ser 09/27/2013 1.2  0.4 - 1.5 mg/dL Final  .  Total Bilirubin 09/27/2013 0.5  0.2 - 1.2 mg/dL Final  . Alkaline Phosphatase 09/27/2013 41  39 - 117 U/L Final  . AST 09/27/2013 46* 0 - 37 U/L Final  . ALT 09/27/2013 77* 0 - 53 U/L Final  . Total Protein 09/27/2013 6.6  6.0 - 8.3 g/dL Final  . Albumin 09/27/2013 3.9  3.5 - 5.2 g/dL Final  . Calcium 09/27/2013 9.1  8.4 - 10.5 mg/dL Final  . GFR 09/27/2013 68.68  >60.00 mL/min Final  . Cholesterol 09/27/2013 108  0 - 200 mg/dL Final   ATP III Classification       Desirable:  < 200 mg/dL               Borderline High:  200 - 239 mg/dL          High:  > = 240 mg/dL  . Triglycerides 09/27/2013 97.0  0.0 - 149.0 mg/dL Final   Normal:  <150 mg/dLBorderline High:  150 - 199 mg/dL  . HDL 09/27/2013 31.60* >39.00 mg/dL Final  . VLDL 09/27/2013 19.4  0.0 - 40.0 mg/dL Final  . LDL Cholesterol 09/27/2013 57  0 - 99 mg/dL Final  . Total CHOL/HDL Ratio 09/27/2013 3   Final                  Men           Women1/2 Average Risk     3.4          3.3Average Risk          5.0          4.42X Average Risk          9.6          7.13X Average Risk          15.0          11.0                          Medication List       This list is accurate as of: 10/05/13  3:28 PM.  Always use your most recent med list.               amLODipine 5 MG tablet  Commonly known as:  NORVASC  Take 1 tablet (5 mg total) by mouth 2 (two) times daily.     aspirin EC 81 MG tablet  Take 1 tablet (81 mg total) by mouth daily.     Canagliflozin 300 MG Tabs  Commonly known as:  INVOKANA  Take 1 tablet (300 mg total) by mouth daily.     diazepam 10 MG tablet  Commonly known as:  VALIUM  10 mg.     fluorouracil 5 % cream  Commonly known as:  EFUDEX     glimepiride 2 MG tablet  Commonly known as:  AMARYL  Take 1 tablet (2 mg total) by mouth daily before supper.     HUMALOG KWIKPEN 100 UNIT/ML KiwkPen  Generic drug:  insulin lispro  Inject 6 Units into the skin as needed.     HYDROmorphone 2 MG tablet  Commonly known as:  DILAUDID  as needed.     isosorbide mononitrate 120 MG 24 hr tablet  Commonly known as:  IMDUR  Take 1 tablet (120 mg total) by mouth every morning.     metFORMIN 1000 MG tablet  Commonly known as:  GLUCOPHAGE  Takes 500 mg in am  and takes 1000 mg before evening meal     metoprolol succinate 25 MG 24 hr tablet  Commonly known as:  TOPROL-XL  Take 1 tablet (25 mg total) by mouth every evening.     nitroGLYCERIN 0.4 MG SL tablet  Commonly known as:  NITROSTAT  Place 1 tablet (0.4 mg total) under the tongue every 5 (five) minutes as needed for chest pain.     pantoprazole 40 MG tablet  Commonly known as:  PROTONIX  Take 1 tablet (40 mg total) by mouth every morning.     rOPINIRole 1 MG tablet  Commonly known as:  REQUIP  Take 1.5 tablets (1.5 mg total) by mouth at bedtime. Take 1 tab by mouth at bedtime (along with the 0.5mg  tab for a total of 1.5mg  daily)     saccharomyces  boulardii 250 MG capsule  Commonly known as:  FLORASTOR  Take 1 capsule (250 mg total) by mouth 2 (two) times daily.        Allergies: No Known Allergies  Past Medical History  Diagnosis Date  . Hypertension   . Dyslipidemia     pt unable to tolerate niaspan  . GERD (gastroesophageal reflux disease)   . Diabetes mellitus     followed by Dr. Dwyane Dee  . History of non-ST elevation myocardial infarction (NSTEMI) 2005  . OSA on CPAP   . Bladder cancer followed by dr Risa Grill  . 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 History of angina CHRONIC -- CONTROLLED W/ IMDUR  . Peripheral neuropathy RIGHT LEG NUMBNESS  . Coronary artery disease     followed by Dr. [name]; Lexiscan Myoview (10/14):  Low risk, EF 57%, small inf infarct  . Coronary vasospasm PER DR ROSS NOTE 03-22-2012--  INTER. TIGHTNESS    PER PT PROBABLE FROM STRESS  . Bladder cancer     Past Surgical History  Procedure Laterality Date  . Umbillical hernia repair  2004  . Elbow surgery  07-07-2003    RIGHT ELBOW ARTHROSCOPY W/ OPEN RECONSTRUCTION  . Shoulder surgery  2005    RIGHT ROTATOR CUFF REPAIR  . Cystoscopy with biopsy  04/07/2011    Procedure: CYSTOSCOPY WITH BIOPSY/ RIGHT URETEROSCOPY;  Surgeon: Bernestine Amass, MD;  Location: [facility];  Service: Urology;  Laterality: N/A;    cystoscopy, cystogram, biopsy and fulgeration  . Cysto/ resection bladder tumor/ left retrograde pyelogram/ right ureteroscopy  08-07-2010    TCC OF BLADDER   S/P DISTAL URETERECTOMY/ REIMPLANTATION  . Cysto/ cystogram/ ureteroscopy  02-21-2009  . Right distal ureterectomy w/ reimplantation  10-30-2008    TCC OF RIGHT DISTAL URETER  . Cysto/ right ureteroscopy/ bx ureteral tumor  10-11-2008  . Cysto/ bladder bx  09-13-2008;  08-13-2007; 04-19-2007; 06-01-2006  . Cysto/ bilateral retrograde pyelogram/ right ureteroscopic laser fulguration ureteral tumor  02-16-2008  .  Rhinoplasty w/ modified cartilage graft  05-17-2007    INTERNAL NASAL VALVE COLLAPSE/ OSA/ SEPTAL PERFERATION  . Transurethral resection of bladder tumor  01-13-2007  . Appendectomy  08-03-2004  . Knee arthroscopy w/ debridement  02-08-2004    RIGHT KNEE  . Cardiac catheterization  x3  last one 06-17-2007    MILD NON-OBSTRUCTIVE CAD/ NORMAL LVF/ 30% LEFT RENAL ARTERY STENOSIS  . Cystoscopy with biopsy  12/29/2011    Procedure: CYSTOSCOPY WITH BIOPSY;  Surgeon: Bernestine Amass, MD;  Location: [facility];  Service: Urology;  Laterality: N/A;  30 MIN  WITH FULGERATION    . Cystoscopy w/ retrogrades  04/19/2012    Procedure: CYSTOSCOPY WITH RETROGRADE PYELOGRAM;  Surgeon: Bernestine Amass, MD;  Location: [facility];  Service: Urology;  Laterality: Bilateral;  30 MINS Cysto, Biopsy, possible TURBT, Bilateral retrograde pyelograms with Mitomycin C instilation Post op   . Transurethral resection of bladder tumor  04/19/2012    Procedure: TRANSURETHRAL RESECTION OF BLADDER TUMOR (TURBT);  Surgeon: Bernestine Amass, MD;  Location: [facility];  Service: Urology;  Laterality: N/A;    Family History  Problem Relation Age of Onset  . Coronary artery disease Father   . Colon cancer Neg Hx   . Stomach cancer Neg Hx   . Diabetes Maternal Grandmother   . Heart disease Paternal Grandfather     Social History:  reports that he quit smoking about 6 years ago. His smoking use included Cigarettes. He has a 10 pack-year smoking history. He has never used smokeless tobacco. He reports that he does not drink alcohol or use illicit drugs.  Review of Systems:  HYPERTENSION:  Very well controlled and is on amlodipine and metoprolol only He is currently not on ACE inhibitor and has normal microalbumin  HYPERLIPIDEMIA: The lipid abnormality consists of elevated LDL and low HDL  Lab Results  Component Value Date   CHOL 108 09/27/2013   HDL 31.60* 09/27/2013   LDLCALC  57 09/27/2013   TRIG 97.0 09/27/2013   CHOLHDL 3 09/27/2013   He still has abnormal liver functions ? Fatty liver     Examination:   BP 124/72  Pulse 71  Temp(Src) 98.2 F (36.8 C)  Resp 14  Ht 5\' 9"  (1.753 m)  Wt 201 lb 9.6 oz (91.445 kg)  BMI 29.76 kg/m2  SpO2 96%  Body mass index is 29.76 kg/(m^2).    ASSESSMENT/ PLAN:   Diabetes type 2   The patient's diabetes control appears to be Better with adding low-dose Amaryl to his regimen of Invokana and  metformin   Although his home readings are mostly good he has sporadic readings in the evenings based on his diet or resuming regular drinks He also has not been able to be active because of his knee joint pain A1c has improved but not at target as yet  He will continue the same regimen for now, to call if he has any significant hypoglycemia  Elayne Snare 10/05/2013, 3:28 PM

## 2014-01-02 ENCOUNTER — Other Ambulatory Visit (INDEPENDENT_AMBULATORY_CARE_PROVIDER_SITE_OTHER): Payer: Medicare Other

## 2014-01-02 DIAGNOSIS — E119 Type 2 diabetes mellitus without complications: Secondary | ICD-10-CM

## 2014-01-02 LAB — BASIC METABOLIC PANEL
BUN: 13 mg/dL (ref 6–23)
CHLORIDE: 104 meq/L (ref 96–112)
CO2: 24 mEq/L (ref 19–32)
CREATININE: 1 mg/dL (ref 0.4–1.5)
Calcium: 8.9 mg/dL (ref 8.4–10.5)
GFR: 77.83 mL/min (ref >60.00)
Glucose, Bld: 134 mg/dL — ABNORMAL HIGH (ref 70–99)
POTASSIUM: 3.8 meq/L (ref 3.5–5.1)
Sodium: 139 mEq/L (ref 135–145)

## 2014-01-02 LAB — MICROALBUMIN / CREATININE URINE RATIO
Creatinine,U: 120.9 mg/dL
MICROALB UR: 1.2 mg/dL (ref 0.0–1.9)
Microalb Creat Ratio: 1 mg/g (ref 0.0–30.0)

## 2014-01-02 LAB — HEMOGLOBIN A1C: Hgb A1c MFr Bld: 7.3 % — ABNORMAL HIGH (ref 4.6–6.5)

## 2014-01-05 ENCOUNTER — Encounter: Payer: Self-pay | Admitting: Endocrinology

## 2014-01-05 ENCOUNTER — Ambulatory Visit (INDEPENDENT_AMBULATORY_CARE_PROVIDER_SITE_OTHER): Payer: Medicare Other | Admitting: Endocrinology

## 2014-01-05 VITALS — BP 118/78 | HR 67 | Temp 98.2°F | Resp 14 | Ht 69.0 in | Wt 201.6 lb

## 2014-01-05 DIAGNOSIS — E785 Hyperlipidemia, unspecified: Secondary | ICD-10-CM

## 2014-01-05 DIAGNOSIS — E119 Type 2 diabetes mellitus without complications: Secondary | ICD-10-CM

## 2014-01-05 DIAGNOSIS — I10 Essential (primary) hypertension: Secondary | ICD-10-CM

## 2014-01-05 NOTE — Progress Notes (Signed)
Patient ID: Isaac [name], male   DOB: July 06, 1955, 58 y.o.   MRN: [id number]   Reason for Appointment: Diabetes follow-up   History of Present Illness   Diagnosis: Type 2 DIABETES MELITUS, long-standing      He has been on various regimens of treatment in the past including mealtime insulin, Victoza and Januvia Previously he has had difficulty tolerating Victoza and also large doses of metformin He appears to have benefited  from using Invokana in 2014 with improved control and some weight loss Subsequently with using low dose Amaryl he was still getting some hypoglycemia; in 10/14  he was told to stop his Amaryl  Recent history: Because of relatively high readings on his visit in 3/15 especially late evening and fasting he was restarted on Amaryl, half of a 2 mg tablet at suppertime With this his blood sugars have continue to be well controlled A1c has improved further since his last visit although still over 7% Blood sugar however at home are averaging only 124 and he is checking them at various times of the day Again his blood sugars are relatively higher in the mornings Recent he has not had high readings after supper  Oral hypoglycemic drugs: Invokana, metformin, Amaryl 1 mg ACS        Side effects from medications: nausea with Victoza Insulin: None         Monitors blood glucose: 1-2 times a day.    Glucometer: One Touch.          Blood Glucose readings from meter download:   PREMEAL Breakfast Lunch Dinner  PCS  Overall  Glucose range:  104-161   121-149    99-150    Median:  125      122    Hypoglycemia: None documented although he may feel shaky when blood sugar is below 80 .          Meals: 2- 3 meals per day.  He is rarely drinking regular soft drinks  Physical activity: exercise: some gardening/ walking, limited by knee joint pain          Dietician visit: Most recent:3-4 years ago          Complications: are: coronary artery disease     Wt Readings from Last 3  Encounters:  01/05/14 201 lb 9.6 oz (91.445 kg)  10/05/13 201 lb 9.6 oz (91.445 kg)  09/07/13 205 lb 6.4 oz (93.169 kg)    Lab Results  Component Value Date   HGBA1C 7.3* 01/02/2014   HGBA1C 7.7* 09/27/2013   HGBA1C 8.4* 07/11/2013   Lab Results  Component Value Date   MICROALBUR 1.2 01/02/2014   LDLCALC 57 09/27/2013   CREATININE 1.0 01/02/2014     Appointment on 01/02/2014  Component Date Value Ref Range Status  . Hemoglobin A1C 01/02/2014 7.3* 4.6 - 6.5 % Final   Glycemic Control Guidelines for People with Diabetes:Non Diabetic:  <6%Goal of Therapy: <7%Additional Action Suggested:  >8%   . Sodium 01/02/2014 139  135 - 145 mEq/L Final  . Potassium 01/02/2014 3.8  3.5 - 5.1 mEq/L Final  . Chloride 01/02/2014 104  96 - 112 mEq/L Final  . CO2 01/02/2014 24  19 - 32 mEq/L Final  . Glucose, Bld 01/02/2014 134* 70 - 99 mg/dL Final  . BUN 01/02/2014 13  6 - 23 mg/dL Final  . Creatinine, Ser 01/02/2014 1.0  0.4 - 1.5 mg/dL Final  . Calcium 01/02/2014 8.9  8.4 - 10.5 mg/dL Final  .  GFR 01/02/2014 77.83  >60.00 mL/min Final  . Microalb, Ur 01/02/2014 1.2  0.0 - 1.9 mg/dL Final  . Creatinine,U 01/02/2014 120.9   Final  . Microalb Creat Ratio 01/02/2014 1.0  0.0 - 30.0 mg/g Final      Medication List       This list is accurate as of: 01/05/14 10:28 AM.  Always use your most recent med list.               amLODipine 5 MG tablet  Commonly known as:  NORVASC  Take 1 tablet (5 mg total) by mouth 2 (two) times daily.     aspirin EC 81 MG tablet  Take 1 tablet (81 mg total) by mouth daily.     Canagliflozin 300 MG Tabs  Commonly known as:  INVOKANA  Take 1 tablet (300 mg total) by mouth daily.     diazepam 10 MG tablet  Commonly known as:  VALIUM  10 mg.     glimepiride 2 MG tablet  Commonly known as:  AMARYL  Take 1 tablet (2 mg total) by mouth daily before supper.     HUMALOG KWIKPEN 100 UNIT/ML KiwkPen  Generic drug:  insulin lispro  Inject 6 Units into the skin as  needed.     HYDROmorphone 2 MG tablet  Commonly known as:  DILAUDID  as needed.     isosorbide mononitrate 120 MG 24 hr tablet  Commonly known as:  IMDUR  Take 1 tablet (120 mg total) by mouth every morning.     metFORMIN 1000 MG tablet  Commonly known as:  GLUCOPHAGE  Takes 500 mg in am and takes 1000 mg before evening meal     metoprolol succinate 25 MG 24 hr tablet  Commonly known as:  TOPROL-XL  Take 1 tablet (25 mg total) by mouth every evening.     nitroGLYCERIN 0.4 MG SL tablet  Commonly known as:  NITROSTAT  Place 1 tablet (0.4 mg total) under the tongue every 5 (five) minutes as needed for chest pain.     pantoprazole 40 MG tablet  Commonly known as:  PROTONIX  Take 1 tablet (40 mg total) by mouth every morning.     rOPINIRole 1 MG tablet  Commonly known as:  REQUIP  Take 1.5 tablets (1.5 mg total) by mouth at bedtime. Take 1 tab by mouth at bedtime (along with the 0.5mg  tab for a total of 1.5mg  daily)     saccharomyces boulardii 250 MG capsule  Commonly known as:  FLORASTOR  Take 1 capsule (250 mg total) by mouth 2 (two) times daily.        Allergies: No Known Allergies  Past Medical History  Diagnosis Date  . Hypertension   . Dyslipidemia     pt unable to tolerate niaspan  . GERD (gastroesophageal reflux disease)   . Diabetes mellitus     followed by Dr. Dwyane Dee  . History of non-ST elevation myocardial infarction (NSTEMI) 2005  . OSA on CPAP   . Bladder cancer followed by dr Risa Grill  . 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 History of angina CHRONIC -- CONTROLLED W/ IMDUR  . Peripheral neuropathy RIGHT LEG NUMBNESS  . Coronary artery disease     followed by Dr. [name]; Lexiscan Myoview (10/14):  Low risk, EF 57%, small inf infarct  . Coronary vasospasm PER DR ROSS NOTE 03-22-2012--  INTER. TIGHTNESS    PER PT PROBABLE FROM  STRESS  . Bladder cancer     Past Surgical History  Procedure Laterality Date   . Umbillical hernia repair  2004  . Elbow surgery  07-07-2003    RIGHT ELBOW ARTHROSCOPY W/ OPEN RECONSTRUCTION  . Shoulder surgery  2005    RIGHT ROTATOR CUFF REPAIR  . Cystoscopy with biopsy  04/07/2011    Procedure: CYSTOSCOPY WITH BIOPSY/ RIGHT URETEROSCOPY;  Surgeon: Bernestine Amass, MD;  Location: [facility];  Service: Urology;  Laterality: N/A;    cystoscopy, cystogram, biopsy and fulgeration  . Cysto/ resection bladder tumor/ left retrograde pyelogram/ right ureteroscopy  08-07-2010    TCC OF BLADDER   S/P DISTAL URETERECTOMY/ REIMPLANTATION  . Cysto/ cystogram/ ureteroscopy  02-21-2009  . Right distal ureterectomy w/ reimplantation  10-30-2008    TCC OF RIGHT DISTAL URETER  . Cysto/ right ureteroscopy/ bx ureteral tumor  10-11-2008  . Cysto/ bladder bx  09-13-2008;  08-13-2007; 04-19-2007; 06-01-2006  . Cysto/ bilateral retrograde pyelogram/ right ureteroscopic laser fulguration ureteral tumor  02-16-2008  . Rhinoplasty w/ modified cartilage graft  05-17-2007    INTERNAL NASAL VALVE COLLAPSE/ OSA/ SEPTAL PERFERATION  . Transurethral resection of bladder tumor  01-13-2007  . Appendectomy  08-03-2004  . Knee arthroscopy w/ debridement  02-08-2004    RIGHT KNEE  . Cardiac catheterization  x3  last one 06-17-2007    MILD NON-OBSTRUCTIVE CAD/ NORMAL LVF/ 30% LEFT RENAL ARTERY STENOSIS  . Cystoscopy with biopsy  12/29/2011    Procedure: CYSTOSCOPY WITH BIOPSY;  Surgeon: Bernestine Amass, MD;  Location: [facility];  Service: Urology;  Laterality: N/A;  [place]    . Cystoscopy w/ retrogrades  04/19/2012    Procedure: CYSTOSCOPY WITH RETROGRADE PYELOGRAM;  Surgeon: Bernestine Amass, MD;  Location: [facility];  Service: Urology;  Laterality: Bilateral;  30 MINS Cysto, Biopsy, possible TURBT, Bilateral retrograde pyelograms with Mitomycin C instilation Post op   . Transurethral resection of bladder tumor  04/19/2012     Procedure: TRANSURETHRAL RESECTION OF BLADDER TUMOR (TURBT);  Surgeon: Bernestine Amass, MD;  Location: [facility];  Service: Urology;  Laterality: N/A;    Family History  Problem Relation Age of Onset  . Coronary artery disease Father   . Colon cancer Neg Hx   . Stomach cancer Neg Hx   . Diabetes Maternal Grandmother   . Heart disease Paternal Grandfather     Social History:  reports that he quit smoking about 6 years ago. His smoking use included Cigarettes. He has a 10 pack-year smoking history. He has never used smokeless tobacco. He reports that he does not drink alcohol or use illicit drugs.  Review of Systems:  HYPERTENSION:  Very well controlled and is on amlodipine and metoprolol only He is currently not on ACE inhibitor and has normal microalbumin  HYPERLIPIDEMIA: The lipid abnormality consists of elevated LDL and low HDL  Lab Results  Component Value Date   CHOL 108 09/27/2013   HDL 31.60* 09/27/2013   LDLCALC 57 09/27/2013   TRIG 97.0 09/27/2013   CHOLHDL 3 09/27/2013   He  has had abnormal liver functions ? Fatty liver     Examination:   BP 118/78  Pulse 67  Temp(Src) 98.2 F (36.8 C)  Resp 14  Ht 5\' 9"  (1.753 m)  Wt 201 lb 9.6 oz (91.445 kg)  BMI 29.76 kg/m2  SpO2 98%  Body mass index is 29.76 kg/(m^2).  ASSESSMENT/ PLAN:   Diabetes type 2   The patient's diabetes control appears to be good and is doing well with the 3 drug regimen  No hypoglycemia with his 1 mg Amaryl in addition to his regimen of Invokana and  metformin   Although his home readings are mostly good he has sporadic readings in the mornings A1c is 7.3 and higher than expected for his home blood sugars He also has not been able to exercise much because of his knee joint pain  He will continue the same regimen for now, to call if he has any significant change in blood sugar control  Latima Hamza 01/05/2014, 10:28 AM

## 2014-02-13 ENCOUNTER — Other Ambulatory Visit: Payer: Self-pay | Admitting: *Deleted

## 2014-02-13 ENCOUNTER — Ambulatory Visit (INDEPENDENT_AMBULATORY_CARE_PROVIDER_SITE_OTHER): Payer: Medicare Other | Admitting: *Deleted

## 2014-02-13 DIAGNOSIS — Z23 Encounter for immunization: Secondary | ICD-10-CM

## 2014-03-09 ENCOUNTER — Other Ambulatory Visit: Payer: Self-pay | Admitting: *Deleted

## 2014-03-09 MED ORDER — GLIMEPIRIDE 2 MG PO TABS: 2.0000 mg | ORAL_TABLET | Freq: Every day | ORAL | Status: DC

## 2014-03-09 MED ORDER — METFORMIN HCL 1000 MG PO TABS: ORAL_TABLET | ORAL | Status: DC

## 2014-03-13 ENCOUNTER — Other Ambulatory Visit: Payer: Self-pay

## 2014-03-13 MED ORDER — PANTOPRAZOLE SODIUM 40 MG PO TBEC: 40.0000 mg | DELAYED_RELEASE_TABLET | Freq: Every morning | ORAL | Status: DC

## 2014-03-13 MED ORDER — AMLODIPINE BESYLATE 5 MG PO TABS: 5.0000 mg | ORAL_TABLET | Freq: Two times a day (BID) | ORAL | Status: DC

## 2014-03-13 MED ORDER — METOPROLOL SUCCINATE ER 25 MG PO TB24: 25.0000 mg | ORAL_TABLET | Freq: Every evening | ORAL | Status: DC

## 2014-03-13 MED ORDER — NITROGLYCERIN 0.4 MG SL SUBL: 0.4000 mg | SUBLINGUAL_TABLET | SUBLINGUAL | Status: DC | PRN

## 2014-03-13 MED ORDER — ISOSORBIDE MONONITRATE ER 120 MG PO TB24: 120.0000 mg | ORAL_TABLET | Freq: Every morning | ORAL | Status: DC

## 2014-05-22 ENCOUNTER — Telehealth: Payer: Self-pay | Admitting: *Deleted

## 2014-05-22 ENCOUNTER — Other Ambulatory Visit (INDEPENDENT_AMBULATORY_CARE_PROVIDER_SITE_OTHER): Payer: BLUE CROSS/BLUE SHIELD

## 2014-05-22 ENCOUNTER — Ambulatory Visit: Payer: Medicare Other | Admitting: Endocrinology

## 2014-05-22 ENCOUNTER — Other Ambulatory Visit: Payer: Self-pay | Admitting: *Deleted

## 2014-05-22 DIAGNOSIS — Z129 Encounter for screening for malignant neoplasm, site unspecified: Secondary | ICD-10-CM

## 2014-05-22 DIAGNOSIS — E119 Type 2 diabetes mellitus without complications: Secondary | ICD-10-CM

## 2014-05-22 LAB — COMPREHENSIVE METABOLIC PANEL
ALK PHOS: 44 U/L (ref 39–117)
ALT: 44 U/L (ref 0–53)
AST: 28 U/L (ref 0–37)
Albumin: 4 g/dL (ref 3.5–5.2)
BUN: 20 mg/dL (ref 6–23)
CO2: 28 mEq/L (ref 19–32)
Calcium: 9.3 mg/dL (ref 8.4–10.5)
Chloride: 100 mEq/L (ref 96–112)
Creatinine, Ser: 1 mg/dL (ref 0.4–1.5)
GFR: 78.6 mL/min (ref >60.00)
GLUCOSE: 173 mg/dL — AB (ref 70–99)
POTASSIUM: 4.5 meq/L (ref 3.5–5.1)
SODIUM: 136 meq/L (ref 135–145)
TOTAL PROTEIN: 7 g/dL (ref 6.0–8.3)
Total Bilirubin: 0.7 mg/dL (ref 0.2–1.2)

## 2014-05-22 LAB — MICROALBUMIN / CREATININE URINE RATIO
Creatinine,U: 90.2 mg/dL
Microalb Creat Ratio: 0.9 mg/g (ref 0.0–30.0)
Microalb, Ur: 0.8 mg/dL (ref 0.0–1.9)

## 2014-05-22 LAB — HEMOGLOBIN A1C: HEMOGLOBIN A1C: 7.6 % — AB (ref 4.6–6.5)

## 2014-05-22 NOTE — Telephone Encounter (Signed)
[place] for cancer screening

## 2014-05-22 NOTE — Telephone Encounter (Signed)
Patient came in today for labs, he said Dr. Risa Grill wants to know if you can add on a PSA, patient had blood work last week and this wasn't drawn, okay to add?

## 2014-05-23 LAB — PSA: PSA: 0.73 ng/mL (ref 0.10–4.00)

## 2014-05-25 ENCOUNTER — Ambulatory Visit (INDEPENDENT_AMBULATORY_CARE_PROVIDER_SITE_OTHER): Payer: Medicare Other | Admitting: Endocrinology

## 2014-05-25 ENCOUNTER — Encounter: Payer: Self-pay | Admitting: Endocrinology

## 2014-05-25 VITALS — BP 116/78 | HR 64 | Temp 98.2°F | Resp 14 | Ht 69.0 in | Wt 197.2 lb

## 2014-05-25 DIAGNOSIS — E1165 Type 2 diabetes mellitus with hyperglycemia: Secondary | ICD-10-CM

## 2014-05-25 DIAGNOSIS — IMO0002 Reserved for concepts with insufficient information to code with codable children: Secondary | ICD-10-CM

## 2014-05-25 NOTE — Patient Instructions (Signed)
Take Metformin twice daily with food and if able to take 3 daily go up on dose

## 2014-05-25 NOTE — Progress Notes (Signed)
Patient ID: Isaac [name], male   DOB: 10/20/1955, 59 y.o.   MRN: [id number]   Reason for Appointment: Diabetes follow-up   History of Present Illness   Diagnosis: Type 2 DIABETES MELITUS, long-standing      He has been on various regimens of treatment in the past including mealtime insulin, Victoza and Januvia Previously he has had difficulty tolerating Victoza and also large doses of metformin He appears to have benefited  from using Invokana in 2014 with improved control and some weight loss Subsequently with using low dose Amaryl he was still getting some hypoglycemia; in 10/14  he was told to stop his Amaryl Because of relatively high readings  in 3/15 especially late evening and fasting he was restarted on Amaryl, half of a 2 mg tablet at suppertime  Recent history: His A1c has been consistently over 7% and he has had difficulty getting good control This is despite his trying to be watching his portions more recently and losing some weight However he now reveals that he is taking only 1 tablet of metformin a day instead of the prescribed 3 tablets.  He says that he heard that it was hard on the kidneys and did not inform us about this He is checking his blood sugars at various times a day but mostly in the morning Resting blood sugars are mostly high especially recently Also has  high readings and evenings after meals but inconsistently He continues to be compliant with taking his Invokana and Amaryl  Oral hypoglycemic drugs: Invokana, metformin 500 qd, Amaryl 1 mg ACS        Side effects from medications: nausea with Victoza Insulin: None         Monitors blood glucose: 1-2 times a day.    Glucometer: One Touch.          Blood Glucose readings from meter download:   PRE-MEAL Breakfast Lunch Dinner Bedtime Overall  Glucose range:  86-165    85-204   90-202    Median:  130    136   128    Hypoglycemia: None documented although he may feel shaky when blood sugar is below 80 .           Meals: 2- 3 meals per day.  He is rarely drinking regular soft drinks  Physical activity: exercise: some walking, limited by knee joint pain          Dietician visit: Most recent:3-4 years ago          Complications: are: coronary artery disease     Wt Readings from Last 3 Encounters:  05/25/14 197 lb 3.2 oz (89.449 kg)  01/05/14 201 lb 9.6 oz (91.445 kg)  10/05/13 201 lb 9.6 oz (91.445 kg)    Lab Results  Component Value Date   HGBA1C 7.6* 05/22/2014   HGBA1C 7.3* 01/02/2014   HGBA1C 7.7* 09/27/2013   Lab Results  Component Value Date   MICROALBUR 0.8 05/22/2014   LDLCALC 57 09/27/2013   CREATININE 1.0 05/22/2014     Appointment on 05/22/2014  Component Date Value Ref Range Status  . PSA 05/22/2014 0.73  0.10 - 4.00 ng/mL Final  Appointment on 05/22/2014  Component Date Value Ref Range Status  . Hgb A1c MFr Bld 05/22/2014 7.6* 4.6 - 6.5 % Final   Glycemic Control Guidelines for People with Diabetes:Non Diabetic:  <6%Goal of Therapy: <7%Additional Action Suggested:  >8%   . Sodium 05/22/2014 136  135 - 145 mEq/L Final  .  Potassium 05/22/2014 4.5  3.5 - 5.1 mEq/L Final  . Chloride 05/22/2014 100  96 - 112 mEq/L Final  . CO2 05/22/2014 28  19 - 32 mEq/L Final  . Glucose, Bld 05/22/2014 173* 70 - 99 mg/dL Final  . BUN 05/22/2014 20  6 - 23 mg/dL Final  . Creatinine, Ser 05/22/2014 1.0  0.4 - 1.5 mg/dL Final  . Total Bilirubin 05/22/2014 0.7  0.2 - 1.2 mg/dL Final  . Alkaline Phosphatase 05/22/2014 44  39 - 117 U/L Final  . AST 05/22/2014 28  0 - 37 U/L Final  . ALT 05/22/2014 44  0 - 53 U/L Final  . Total Protein 05/22/2014 7.0  6.0 - 8.3 g/dL Final  . Albumin 05/22/2014 4.0  3.5 - 5.2 g/dL Final  . Calcium 05/22/2014 9.3  8.4 - 10.5 mg/dL Final  . GFR 05/22/2014 78.60  >60.00 mL/min Final  . Microalb, Ur 05/22/2014 0.8  0.0 - 1.9 mg/dL Final  . Creatinine,U 05/22/2014 90.2   Final  . Microalb Creat Ratio 05/22/2014 0.9  0.0 - 30.0 mg/g Final       Medication List       This list is accurate as of: 05/25/14 10:17 AM.  Always use your most recent med list.               amLODipine 5 MG tablet  Commonly known as:  NORVASC  Take 1 tablet (5 mg total) by mouth 2 (two) times daily.     aspirin EC 81 MG tablet  Take 1 tablet (81 mg total) by mouth daily.     canagliflozin 300 MG Tabs tablet  Commonly known as:  INVOKANA  Take 1 tablet (300 mg total) by mouth daily.     diazepam 10 MG tablet  Commonly known as:  VALIUM  10 mg.     glimepiride 2 MG tablet  Commonly known as:  AMARYL  Take 1 tablet (2 mg total) by mouth daily before supper.     HUMALOG KWIKPEN 100 UNIT/ML KiwkPen  Generic drug:  insulin lispro  Inject 6 Units into the skin as needed.     HYDROmorphone 2 MG tablet  Commonly known as:  DILAUDID  as needed.     isosorbide mononitrate 120 MG 24 hr tablet  Commonly known as:  IMDUR  Take 1 tablet (120 mg total) by mouth every morning.     metFORMIN 1000 MG tablet  Commonly known as:  GLUCOPHAGE  Takes 500 mg in am and takes 1000 mg before evening meal     metoprolol succinate 25 MG 24 hr tablet  Commonly known as:  TOPROL-XL  Take 1 tablet (25 mg total) by mouth every evening.     nitroGLYCERIN 0.4 MG SL tablet  Commonly known as:  NITROSTAT  Place 1 tablet (0.4 mg total) under the tongue every 5 (five) minutes as needed for chest pain.     pantoprazole 40 MG tablet  Commonly known as:  PROTONIX  Take 1 tablet (40 mg total) by mouth every morning.     rOPINIRole 1 MG tablet  Commonly known as:  REQUIP  Take 1.5 tablets (1.5 mg total) by mouth at bedtime. Take 1 tab by mouth at bedtime (along with the 0.5mg  tab for a total of 1.5mg  daily)     saccharomyces boulardii 250 MG capsule  Commonly known as:  FLORASTOR  Take 1 capsule (250 mg total) by mouth 2 (two) times daily.  Allergies: No Known Allergies  Past Medical History  Diagnosis Date  . Hypertension   . Dyslipidemia     pt  unable to tolerate niaspan  . GERD (gastroesophageal reflux disease)   . Diabetes mellitus     followed by Dr. Dwyane Dee  . History of non-ST elevation myocardial infarction (NSTEMI) 2005  . OSA on CPAP   . Bladder cancer followed by dr Risa Grill  . 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 History of angina CHRONIC -- CONTROLLED W/ IMDUR  . Peripheral neuropathy RIGHT LEG NUMBNESS  . Coronary artery disease     followed by Dr. [name]; Lexiscan Myoview (10/14):  Low risk, EF 57%, small inf infarct  . Coronary vasospasm PER DR ROSS NOTE 03-22-2012--  INTER. TIGHTNESS    PER PT PROBABLE FROM STRESS  . Bladder cancer     Past Surgical History  Procedure Laterality Date  . Umbillical hernia repair  2004  . Elbow surgery  07-07-2003    RIGHT ELBOW ARTHROSCOPY W/ OPEN RECONSTRUCTION  . Shoulder surgery  2005    RIGHT ROTATOR CUFF REPAIR  . Cystoscopy with biopsy  04/07/2011    Procedure: CYSTOSCOPY WITH BIOPSY/ RIGHT URETEROSCOPY;  Surgeon: Bernestine Amass, MD;  Location: [facility];  Service: Urology;  Laterality: N/A;    cystoscopy, cystogram, biopsy and fulgeration  . Cysto/ resection bladder tumor/ left retrograde pyelogram/ right ureteroscopy  08-07-2010    TCC OF BLADDER   S/P DISTAL URETERECTOMY/ REIMPLANTATION  . Cysto/ cystogram/ ureteroscopy  02-21-2009  . Right distal ureterectomy w/ reimplantation  10-30-2008    TCC OF RIGHT DISTAL URETER  . Cysto/ right ureteroscopy/ bx ureteral tumor  10-11-2008  . Cysto/ bladder bx  09-13-2008;  08-13-2007; 04-19-2007; 06-01-2006  . Cysto/ bilateral retrograde pyelogram/ right ureteroscopic laser fulguration ureteral tumor  02-16-2008  . Rhinoplasty w/ modified cartilage graft  05-17-2007    INTERNAL NASAL VALVE COLLAPSE/ OSA/ SEPTAL PERFERATION  . Transurethral resection of bladder tumor  01-13-2007  . Appendectomy  08-03-2004  . Knee arthroscopy w/ debridement  02-08-2004    RIGHT  KNEE  . Cardiac catheterization  x3  last one 06-17-2007    MILD NON-OBSTRUCTIVE CAD/ NORMAL LVF/ 30% LEFT RENAL ARTERY STENOSIS  . Cystoscopy with biopsy  12/29/2011    Procedure: CYSTOSCOPY WITH BIOPSY;  Surgeon: Bernestine Amass, MD;  Location: [facility];  Service: Urology;  Laterality: N/A;  [place]    . Cystoscopy w/ retrogrades  04/19/2012    Procedure: CYSTOSCOPY WITH RETROGRADE PYELOGRAM;  Surgeon: Bernestine Amass, MD;  Location[facility];  Service: Urology;  Laterality: Bilateral;  30 MINS Cysto, Biopsy, possible TURBT, Bilateral retrograde pyelograms with Mitomycin C instilation Post op   . Transurethral resection of bladder tumor  04/19/2012    Procedure: TRANSURETHRAL RESECTION OF BLADDER TUMOR (TURBT);  Surgeon: Bernestine Amass, MD;  Location: [facility];  Service: Urology;  Laterality: N/A;    Family History  Problem Relation Age of Onset  . Coronary artery disease Father   . Colon cancer Neg Hx   . Stomach cancer Neg Hx   . Diabetes Maternal Grandmother   . Heart disease Paternal Grandfather     Social History:  reports that he quit smoking about 7 years ago. His smoking use included Cigarettes. He has a 10 pack-year smoking history. He has never used smokeless tobacco. He reports  that he does not drink alcohol or use illicit drugs.  Review of Systems:  HYPERTENSION:   well controlled and is on amlodipine and metoprolol only He is currently not on ACE inhibitor and has normal microalbumin  HYPERLIPIDEMIA: The lipid abnormality consists of elevated LDL and low HDL, on Crestor 3/7 days a week  Lab Results  Component Value Date   CHOL 108 09/27/2013   HDL 31.60* 09/27/2013   LDLCALC 57 09/27/2013   TRIG 97.0 09/27/2013   CHOLHDL 3 09/27/2013   He  has had abnormal liver functions from ? Fatty liver but better now     Examination:   BP 116/78 mmHg  Pulse 64  Temp(Src) 98.2 F (36.8 C)  Resp 14   Ht 5\' 9"  (1.753 m)  Wt 197 lb 3.2 oz (89.449 kg)  BMI 29.11 kg/m2  SpO2 97%  Body mass index is 29.11 kg/(m^2).   No pedal edema  ASSESSMENT/ PLAN:   Diabetes type 2   The patient's diabetes control appears to be somewhat worse with dietary A1c and periodic readings over 200 Although his blood sugars at home are averaging only about 135 he is checking readings mostly in the mornings Blood sugars appear to be inconsistent also at various times of the day and postprandial hyperglycemia is likely to be from inconsistent diet Previously had been intolerant to Victoza and also does not like to take brand-name medication Discussed with him that he is taking subtherapeutic of metformin ER and that he does not damage the kidneys  He will try to increase his metformin to at least 1000 mg a day and also if tolerated go to 1500 mg Encouraged him to be consistent with diet and start more walking  Abnormal liver functions: Resolved  Pecola Haxton 05/25/2014, 10:17 AM

## 2014-08-17 LAB — HM DIABETES EYE EXAM

## 2014-08-21 ENCOUNTER — Other Ambulatory Visit (INDEPENDENT_AMBULATORY_CARE_PROVIDER_SITE_OTHER): Payer: BLUE CROSS/BLUE SHIELD

## 2014-08-21 DIAGNOSIS — IMO0002 Reserved for concepts with insufficient information to code with codable children: Secondary | ICD-10-CM

## 2014-08-21 DIAGNOSIS — E1165 Type 2 diabetes mellitus with hyperglycemia: Secondary | ICD-10-CM | POA: Diagnosis not present

## 2014-08-21 LAB — LIPID PANEL
Cholesterol: 128 mg/dL (ref 0–200)
HDL: 31.8 mg/dL — AB (ref >39.00)
LDL CALC: 70 mg/dL (ref 0–99)
NonHDL: 96.2
TRIGLYCERIDES: 130 mg/dL (ref 0.0–149.0)
Total CHOL/HDL Ratio: 4
VLDL: 26 mg/dL (ref 0.0–40.0)

## 2014-08-21 LAB — COMPREHENSIVE METABOLIC PANEL
ALBUMIN: 4.2 g/dL (ref 3.5–5.2)
ALT: 63 U/L — ABNORMAL HIGH (ref 0–53)
AST: 40 U/L — ABNORMAL HIGH (ref 0–37)
Alkaline Phosphatase: 44 U/L (ref 39–117)
BUN: 13 mg/dL (ref 6–23)
CALCIUM: 9.7 mg/dL (ref 8.4–10.5)
CO2: 29 meq/L (ref 19–32)
Chloride: 97 mEq/L (ref 96–112)
Creatinine, Ser: 1.03 mg/dL (ref 0.40–1.50)
GFR: 78.54 mL/min (ref >60.00)
Glucose, Bld: 142 mg/dL — ABNORMAL HIGH (ref 70–99)
Potassium: 4.3 mEq/L (ref 3.5–5.1)
SODIUM: 134 meq/L — AB (ref 135–145)
Total Bilirubin: 1 mg/dL (ref 0.2–1.2)
Total Protein: 6.9 g/dL (ref 6.0–8.3)

## 2014-08-21 LAB — HEMOGLOBIN A1C: HEMOGLOBIN A1C: 6.8 % — AB (ref 4.6–6.5)

## 2014-08-24 ENCOUNTER — Ambulatory Visit (INDEPENDENT_AMBULATORY_CARE_PROVIDER_SITE_OTHER): Payer: BLUE CROSS/BLUE SHIELD | Admitting: Endocrinology

## 2014-08-24 VITALS — BP 110/58 | HR 74 | Temp 97.8°F | Ht 69.0 in | Wt 197.1 lb

## 2014-08-24 DIAGNOSIS — E1165 Type 2 diabetes mellitus with hyperglycemia: Secondary | ICD-10-CM

## 2014-08-24 DIAGNOSIS — R7989 Other specified abnormal findings of blood chemistry: Secondary | ICD-10-CM | POA: Diagnosis not present

## 2014-08-24 DIAGNOSIS — IMO0002 Reserved for concepts with insufficient information to code with codable children: Secondary | ICD-10-CM

## 2014-08-24 DIAGNOSIS — R945 Abnormal results of liver function studies: Secondary | ICD-10-CM

## 2014-08-24 MED ORDER — METFORMIN HCL 1000 MG PO TABS: 1000.0000 mg | ORAL_TABLET | Freq: Two times a day (BID) | ORAL | Status: DC

## 2014-08-24 MED ORDER — CANAGLIFLOZIN 300 MG PO TABS: 300.0000 mg | ORAL_TABLET | Freq: Every day | ORAL | Status: DC

## 2014-08-24 MED ORDER — GLIMEPIRIDE 2 MG PO TABS: 2.0000 mg | ORAL_TABLET | Freq: Every day | ORAL | Status: DC

## 2014-08-24 NOTE — Patient Instructions (Signed)
Follow up with PCP  Snack when very active  Hold Crestor

## 2014-08-24 NOTE — Progress Notes (Signed)
Pre visit review using our clinic review tool, if applicable. No additional management support is needed unless otherwise documented below in the visit note. 

## 2014-08-24 NOTE — Progress Notes (Signed)
Patient ID: Isaac [name], male   DOB: Mar 22, 1956, 59 y.o.   MRN: [id number]   Reason for Appointment: Diabetes follow-up   History of Present Illness   Diagnosis: Type 2 DIABETES MELITUS, long-standing      He has been on various regimens of treatment in the past including mealtime insulin, Victoza and Januvia Previously he has had difficulty tolerating Victoza and also large doses of metformin He appears to have benefited  from using Invokana in 2014 with improved control and some weight loss Subsequently with using low dose Amaryl he was still getting some hypoglycemia; in 10/14  he was told to stop his Amaryl Because of relatively high readings  in 3/15 especially late evening and fasting he was restarted on Amaryl, half of a 2 mg tablet at suppertime  Recent history: His A1c is significantly improved now On his last visit in 05/2014 he apparently had not been taking his metformin as directed and had reduced it on his own for fear of side effects He now is taking 1 g twice a day without any GI side effects His blood sugars improved considerably at home also  He is only taking 1 mg Amaryl at suppertime now and with this his fasting readings are generally near normal without overnight hypoglycemia Only hypoglycemia occurred when he was very active during the day and did not eat enough Also postprandial readings are excellent usually He continues to be compliant with taking his Invokana and Amaryl  Oral hypoglycemic drugs: Invokana, metformin 500 qd, Amaryl 1 mg ACS         Side effects from medications: nausea with Victoza         Monitors blood glucose: 1-2 times a day.    Glucometer: One Touch.          Blood Glucose readings from meter download:   PRE-MEAL Breakfast Lunch Dinner  PCS  Overall  Glucose range:  98-155    101-127   105   104-195    Mean/median:  112     120  114.     Hypoglycemia: He had one episode of hypoglycemia in the afternoon and also the next morning  with blood sugars in the 40s .          Meals: 2- 3 meals per day.  Trying to eat healthy now Physical activity: exercise: more walking, limited by knee joint pain, yard work          Dietician visit: Most recent:3-4 years ago          Complications: are: coronary artery disease     Wt Readings from Last 3 Encounters:  08/24/14 197 lb 2 oz (89.415 kg)  05/25/14 197 lb 3.2 oz (89.449 kg)  01/05/14 201 lb 9.6 oz (91.445 kg)    Lab Results  Component Value Date   HGBA1C 6.8* 08/21/2014   HGBA1C 7.6* 05/22/2014   HGBA1C 7.3* 01/02/2014   Lab Results  Component Value Date   MICROALBUR 0.8 05/22/2014   [place] 70 08/21/2014   CREATININE 1.03 08/21/2014     Appointment on 08/21/2014  Component Date Value Ref Range Status  . Sodium 08/21/2014 134* 135 - 145 mEq/L Final  . Potassium 08/21/2014 4.3  3.5 - 5.1 mEq/L Final  . Chloride 08/21/2014 97  96 - 112 mEq/L Final  . CO2 08/21/2014 29  19 - 32 mEq/L Final  . Glucose, Bld 08/21/2014 142* 70 - 99 mg/dL Final  . BUN 08/21/2014 13  6 -  23 mg/dL Final  . Creatinine, Ser 08/21/2014 1.03  0.40 - 1.50 mg/dL Final  . Total Bilirubin 08/21/2014 1.0  0.2 - 1.2 mg/dL Final  . Alkaline Phosphatase 08/21/2014 44  39 - 117 U/L Final  . AST 08/21/2014 40* 0 - 37 U/L Final  . ALT 08/21/2014 63* 0 - 53 U/L Final  . Total Protein 08/21/2014 6.9  6.0 - 8.3 g/dL Final  . Albumin 08/21/2014 4.2  3.5 - 5.2 g/dL Final  . Calcium 08/21/2014 9.7  8.4 - 10.5 mg/dL Final  . GFR 08/21/2014 78.54  >60.00 mL/min Final  . Hgb A1c MFr Bld 08/21/2014 6.8* 4.6 - 6.5 % Final   Glycemic Control Guidelines for People with Diabetes:Non Diabetic:  <6%Goal of Therapy: <7%Additional Action Suggested:  >8%   . Cholesterol 08/21/2014 128  0 - 200 mg/dL Final   ATP III Classification       Desirable:  < 200 mg/dL               Borderline High:  200 - 239 mg/dL          High:  > = 240 mg/dL  . Triglycerides 08/21/2014 130.0  0.0 - 149.0 mg/dL Final   Normal:  <150  mg/dLBorderline High:  150 - 199 mg/dL  . HDL 08/21/2014 31.80* >39.00 mg/dL Final  . VLDL 08/21/2014 26.0  0.0 - 40.0 mg/dL Final  . LDL Cholesterol 08/21/2014 70  0 - 99 mg/dL Final  . Total CHOL/HDL Ratio 08/21/2014 4   Final                  Men          Women1/2 Average Risk     3.4          3.3Average Risk          5.0          4.42X Average Risk          9.6          7.13X Average Risk          15.0          11.0                      . NonHDL 08/21/2014 96.20   Final   NOTE:  Non-HDL goal should be 30 mg/dL higher than patient's LDL goal (i.e. LDL goal of < 70 mg/dL, would have non-HDL goal of < 100 mg/dL)      Medication List       This list is accurate as of: 08/24/14 10:11 AM.  Always use your most recent med list.               amLODipine 5 MG tablet  Commonly known as:  NORVASC  Take 1 tablet (5 mg total) by mouth 2 (two) times daily.     aspirin EC 81 MG tablet  Take 1 tablet (81 mg total) by mouth daily.     canagliflozin 300 MG Tabs tablet  Commonly known as:  INVOKANA  Take 1 tablet (300 mg total) by mouth daily.     diazepam 10 MG tablet  Commonly known as:  VALIUM  10 mg.     glimepiride 2 MG tablet  Commonly known as:  AMARYL  Take 1 tablet (2 mg total) by mouth daily before supper.     HUMALOG KWIKPEN 100 UNIT/ML KiwkPen  Generic drug:  insulin lispro  Inject 6 Units into the skin as needed.     HYDROmorphone 2 MG tablet  Commonly known as:  DILAUDID  as needed.     isosorbide mononitrate 120 MG 24 hr tablet  Commonly known as:  IMDUR  Take 1 tablet (120 mg total) by mouth every morning.     metFORMIN 1000 MG tablet  Commonly known as:  GLUCOPHAGE  Takes 500 mg in am and takes 1000 mg before evening meal     metoprolol succinate 25 MG 24 hr tablet  Commonly known as:  TOPROL-XL  Take 1 tablet (25 mg total) by mouth every evening.     nitroGLYCERIN 0.4 MG SL tablet  Commonly known as:  NITROSTAT  Place 1 tablet (0.4 mg total) under the  tongue every 5 (five) minutes as needed for chest pain.     pantoprazole 40 MG tablet  Commonly known as:  PROTONIX  Take 1 tablet (40 mg total) by mouth every morning.     rOPINIRole 1 MG tablet  Commonly known as:  REQUIP  Take 1.5 tablets (1.5 mg total) by mouth at bedtime. Take 1 tab by mouth at bedtime (along with the 0.5mg  tab for a total of 1.5mg  daily)     saccharomyces boulardii 250 MG capsule  Commonly known as:  FLORASTOR  Take 1 capsule (250 mg total) by mouth 2 (two) times daily.        Allergies: No Known Allergies  Past Medical History  Diagnosis Date  . Hypertension   . Dyslipidemia     pt unable to tolerate niaspan  . GERD (gastroesophageal reflux disease)   . Diabetes mellitus     followed by Dr. Dwyane Dee  . History of non-ST elevation myocardial infarction (NSTEMI) 2005  . OSA on CPAP   . Bladder cancer followed by dr Risa Grill  . 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 History of angina CHRONIC -- CONTROLLED W/ IMDUR  . Peripheral neuropathy RIGHT LEG NUMBNESS  . Coronary artery disease     followed by Dr. [name]; Lexiscan Myoview (10/14):  Low risk, EF 57%, small inf infarct  . Coronary vasospasm PER DR ROSS NOTE 03-22-2012--  INTER. TIGHTNESS    PER PT PROBABLE FROM STRESS  . Bladder cancer     Past Surgical History  Procedure Laterality Date  . Umbillical hernia repair  2004  . Elbow surgery  07-07-2003    RIGHT ELBOW ARTHROSCOPY W/ OPEN RECONSTRUCTION  . Shoulder surgery  2005    RIGHT ROTATOR CUFF REPAIR  . Cystoscopy with biopsy  04/07/2011    Procedure: CYSTOSCOPY WITH BIOPSY/ RIGHT URETEROSCOPY;  Surgeon: Bernestine Amass, MD;  Location: [facility];  Service: Urology;  Laterality: N/A;    cystoscopy, cystogram, biopsy and fulgeration  . Cysto/ resection bladder tumor/ left retrograde pyelogram/ right ureteroscopy  08-07-2010    TCC OF BLADDER   S/P DISTAL URETERECTOMY/ REIMPLANTATION   . Cysto/ cystogram/ ureteroscopy  02-21-2009  . Right distal ureterectomy w/ reimplantation  10-30-2008    TCC OF RIGHT DISTAL URETER  . Cysto/ right ureteroscopy/ bx ureteral tumor  10-11-2008  . Cysto/ bladder bx  09-13-2008;  08-13-2007; 04-19-2007; 06-01-2006  . Cysto/ bilateral retrograde pyelogram/ right ureteroscopic laser fulguration ureteral tumor  02-16-2008  . Rhinoplasty w/ modified cartilage graft  05-17-2007    INTERNAL NASAL VALVE COLLAPSE/ OSA/ SEPTAL PERFERATION  . Transurethral resection of bladder tumor  01-13-2007  . Appendectomy  08-03-2004  . Knee arthroscopy w/ debridement  02-08-2004    RIGHT KNEE  . Cardiac catheterization  x3  last one 06-17-2007    MILD NON-OBSTRUCTIVE CAD/ NORMAL LVF/ 30% LEFT RENAL ARTERY STENOSIS  . Cystoscopy with biopsy  12/29/2011    Procedure: CYSTOSCOPY WITH BIOPSY;  Surgeon: Bernestine Amass, MD;  Location: [facility];  Service: Urology;  Laterality: N/A;  [place]    . Cystoscopy w/ retrogrades  04/19/2012    Procedure: CYSTOSCOPY WITH RETROGRADE PYELOGRAM;  Surgeon: Bernestine Amass, MD;  Location: [facility];  Service: Urology;  Laterality: Bilateral;  30 MINS Cysto, Biopsy, possible TURBT, Bilateral retrograde pyelograms with Mitomycin C instilation Post op   . Transurethral resection of bladder tumor  04/19/2012    Procedure: TRANSURETHRAL RESECTION OF BLADDER TUMOR (TURBT);  Surgeon: Bernestine Amass, MD;  Location: [facility];  Service: Urology;  Laterality: N/A;    Family History  Problem Relation Age of Onset  . Coronary artery disease Father   . Colon cancer Neg Hx   . Stomach cancer Neg Hx   . Diabetes Maternal Grandmother   . Heart disease Paternal Grandfather     Social History:  reports that he quit smoking about 7 years ago. His smoking use included Cigarettes. He has a 10 pack-year smoking history. He has never used smokeless tobacco. He reports that he  does not drink alcohol or use illicit drugs.  Review of Systems:  HYPERTENSION:   well controlled and is on amlodipine and metoprolol only He is currently not on ACE inhibitor and has normal microalbumin  HYPERLIPIDEMIA: The lipid abnormality consists of elevated LDL and low HDL, on Crestor 3/7 days a week  Lab Results  Component Value Date   CHOL 128 08/21/2014   HDL 31.80* 08/21/2014   LDLCALC 70 08/21/2014   TRIG 130.0 08/21/2014   CHOLHDL 4 08/21/2014   He  has had abnormal liver functions since about 2012 His cardiologist had evaluated this with hepatitis studies previously and these were negative However even though it was felt to be from Crestor his liver functions have gone up again despite being normal 3 months ago without any change in the dose Not clear if he has taken other statin drugs and he does not remember if he had any side effects from Lipitor      Examination:   BP 110/58 mmHg  Pulse 74  Temp(Src) 97.8 F (36.6 C) (Oral)  Ht 5\' 9"  (1.753 m)  Wt 197 lb 2 oz (89.415 kg)  BMI 29.10 kg/m2  SpO2 95%  Body mass index is 29.1 kg/(m^2).   No pedal edema  ASSESSMENT/ PLAN:   Diabetes type 2   The patient's diabetes control is excellent now with his A1c improving and his blood sugars averaging only 114 at home including after meals He has benefited significantly from increasing his metformin to the maximum dose Also taking low dose Amaryl and Invokana He generally active also now and is watching his diet Will continue same regimen  Abnormal liver functions:  Etiology unclear and does not appear to be from [place] him to follow-up with PCP for evaluation and possible GI consultation   [facility] 08/24/2014, 10:11 AM   Note: This office note was prepared with Dragon voice recognition system technology. Any transcriptional errors that result from this process are unintentional.

## 2014-08-25 ENCOUNTER — Encounter: Payer: Self-pay | Admitting: Endocrinology

## 2014-12-20 ENCOUNTER — Other Ambulatory Visit (INDEPENDENT_AMBULATORY_CARE_PROVIDER_SITE_OTHER): Payer: BLUE CROSS/BLUE SHIELD

## 2014-12-20 DIAGNOSIS — E1165 Type 2 diabetes mellitus with hyperglycemia: Secondary | ICD-10-CM

## 2014-12-20 DIAGNOSIS — IMO0002 Reserved for concepts with insufficient information to code with codable children: Secondary | ICD-10-CM

## 2014-12-20 LAB — COMPREHENSIVE METABOLIC PANEL
ALT: 77 U/L — AB (ref 0–53)
AST: 40 U/L — AB (ref 0–37)
Albumin: 3.8 g/dL (ref 3.5–5.2)
Alkaline Phosphatase: 43 U/L (ref 39–117)
BILIRUBIN TOTAL: 0.4 mg/dL (ref 0.2–1.2)
BUN: 13 mg/dL (ref 6–23)
CALCIUM: 9 mg/dL (ref 8.4–10.5)
CHLORIDE: 103 meq/L (ref 96–112)
CO2: 28 meq/L (ref 19–32)
Creatinine, Ser: 0.99 mg/dL (ref 0.40–1.50)
GFR: 82.12 mL/min (ref >60.00)
Glucose, Bld: 146 mg/dL — ABNORMAL HIGH (ref 70–99)
Potassium: 3.9 mEq/L (ref 3.5–5.1)
Sodium: 138 mEq/L (ref 135–145)
Total Protein: 6.3 g/dL (ref 6.0–8.3)

## 2014-12-20 LAB — HEMOGLOBIN A1C: Hgb A1c MFr Bld: 7 % — ABNORMAL HIGH (ref 4.6–6.5)

## 2014-12-25 ENCOUNTER — Encounter: Payer: Self-pay | Admitting: Endocrinology

## 2014-12-25 ENCOUNTER — Ambulatory Visit (INDEPENDENT_AMBULATORY_CARE_PROVIDER_SITE_OTHER): Payer: BLUE CROSS/BLUE SHIELD | Admitting: Endocrinology

## 2014-12-25 ENCOUNTER — Other Ambulatory Visit: Payer: Self-pay | Admitting: *Deleted

## 2014-12-25 VITALS — BP 134/88 | HR 66 | Temp 98.1°F | Resp 16 | Ht 69.0 in | Wt 201.8 lb

## 2014-12-25 DIAGNOSIS — IMO0002 Reserved for concepts with insufficient information to code with codable children: Secondary | ICD-10-CM

## 2014-12-25 DIAGNOSIS — R7989 Other specified abnormal findings of blood chemistry: Secondary | ICD-10-CM

## 2014-12-25 DIAGNOSIS — R945 Abnormal results of liver function studies: Secondary | ICD-10-CM

## 2014-12-25 DIAGNOSIS — E1165 Type 2 diabetes mellitus with hyperglycemia: Secondary | ICD-10-CM | POA: Diagnosis not present

## 2014-12-25 MED ORDER — METOPROLOL SUCCINATE ER 25 MG PO TB24: 25.0000 mg | ORAL_TABLET | Freq: Every evening | ORAL | Status: DC

## 2014-12-25 MED ORDER — ISOSORBIDE MONONITRATE ER 120 MG PO TB24
120.0000 mg | ORAL_TABLET | Freq: Every morning | ORAL | Status: DC
Start: 2014-12-25 — End: 2015-02-23

## 2014-12-25 MED ORDER — AMLODIPINE BESYLATE 5 MG PO TABS: 5.0000 mg | ORAL_TABLET | Freq: Two times a day (BID) | ORAL | Status: DC

## 2014-12-25 MED ORDER — ROPINIROLE HCL 1 MG PO TABS: 1.5000 mg | ORAL_TABLET | Freq: Every day | ORAL | Status: DC

## 2014-12-25 MED ORDER — METFORMIN HCL 1000 MG PO TABS: 1000.0000 mg | ORAL_TABLET | Freq: Two times a day (BID) | ORAL | Status: DC

## 2014-12-25 MED ORDER — PANTOPRAZOLE SODIUM 40 MG PO TBEC: 40.0000 mg | DELAYED_RELEASE_TABLET | Freq: Every morning | ORAL | Status: DC

## 2014-12-25 MED ORDER — GLIMEPIRIDE 2 MG PO TABS: 2.0000 mg | ORAL_TABLET | Freq: Every day | ORAL | Status: DC

## 2014-12-25 MED ORDER — ROSUVASTATIN CALCIUM 10 MG PO TABS: 10.0000 mg | ORAL_TABLET | Freq: Every day | ORAL | Status: DC

## 2014-12-25 MED ORDER — CANAGLIFLOZIN 300 MG PO TABS: 300.0000 mg | ORAL_TABLET | Freq: Every day | ORAL | Status: DC

## 2014-12-25 NOTE — Progress Notes (Signed)
Patient ID: Isaac [name], male   DOB: 1955/09/19, 58 y.o.   MRN: [id number]   Reason for Appointment: Diabetes follow-up   History of Present Illness   Diagnosis: Type 2 DIABETES MELITUS, long-standing      He has been on various regimens of treatment in the past including mealtime insulin, Victoza and Januvia Previously he has had difficulty tolerating Victoza and also large doses of metformin He appears to have benefited  from using Invokana in 2014 with improved control and some weight loss Subsequently with using low dose Amaryl he was still getting some hypoglycemia; in 10/14  he was told to stop his Amaryl Because of relatively high readings  in 3/15 especially late evening and fasting he was restarted on Amaryl, half of a 2 mg tablet at suppertime  Recent history: His A1c is slightly higher at 7%, however improved since relatively high A1c of 7.6 in January He is taking metformin 1 g twice a day without any GI side effects He is only taking 1 mg Amaryl at suppertime now current blood sugar patterns and problems identified:  Current blood sugar patterns and problems identified:  His fasting blood sugars are fairly good overall but somewhat variable  He does check some readings after supper and bedtime and this can fluctuate significantly; he thinks that blood sugars are high when he is eating more carbohydrate or sweets; occasionally blood sugar may be still high in the morning from the night before.  He is still not able to exercise much and has gained a little weight  He is still concerned about the cost of Invokana but is only taking half a tablet daily   Oral hypoglycemic drugs: Invokana, metformin 500 qd, Amaryl 1 mg ACS         Side effects from medications: nausea with Victoza         Monitors blood glucose: 1-2 times a day.    Glucometer: One Touch.          Blood Glucose readings from meter download:   Mean values apply above for all meters except  median for One Touch  PRE-MEAL Fasting Lunch Dinner Bedtime Overall  Glucose range:  110-169     79-236   Mean/median:  128     135  128    Hypoglycemia: He has not had any recently .          Meals: 2- 3 meals per day.  Trying to eat healthy now Physical activity: exercise:  limited by knee joint pain, yard work          Dietician visit: Most recent:3-4 years ago          Complications: are: coronary artery disease     Wt Readings from Last 3 Encounters:  12/25/14 201 lb 12.8 oz (91.536 kg)  08/24/14 197 lb 2 oz (89.415 kg)  05/25/14 197 lb 3.2 oz (89.449 kg)    Lab Results  Component Value Date   HGBA1C 7.0* 12/20/2014   HGBA1C 6.8* 08/21/2014   HGBA1C 7.6* 05/22/2014   Lab Results  Component Value Date   MICROALBUR 0.8 05/22/2014   LDLCALC 70 08/21/2014   CREATININE 0.99 12/20/2014     Appointment on 12/20/2014  Component Date Value Ref Range Status  . Hgb A1c MFr Bld 12/20/2014 7.0* 4.6 - 6.5 % Final   Glycemic Control Guidelines for People with Diabetes:Non Diabetic:  <6%Goal of Therapy: <7%Additional Action Suggested:  >8%   . Sodium 12/20/2014  138  135 - 145 mEq/L Final  . Potassium 12/20/2014 3.9  3.5 - 5.1 mEq/L Final  . Chloride 12/20/2014 103  96 - 112 mEq/L Final  . CO2 12/20/2014 28  19 - 32 mEq/L Final  . Glucose, Bld 12/20/2014 146* 70 - 99 mg/dL Final  . BUN 12/20/2014 13  6 - 23 mg/dL Final  . Creatinine, Ser 12/20/2014 0.99  0.40 - 1.50 mg/dL Final  . Total Bilirubin 12/20/2014 0.4  0.2 - 1.2 mg/dL Final  . Alkaline Phosphatase 12/20/2014 43  39 - 117 U/L Final  . AST 12/20/2014 40* 0 - 37 U/L Final  . ALT 12/20/2014 77* 0 - 53 U/L Final  . Total Protein 12/20/2014 6.3  6.0 - 8.3 g/dL Final  . Albumin 12/20/2014 3.8  3.5 - 5.2 g/dL Final  . Calcium 12/20/2014 9.0  8.4 - 10.5 mg/dL Final  . GFR 12/20/2014 82.12  >60.00 mL/min Final      Medication List       This list is accurate as of: 12/25/14  8:32 PM.  Always use your most recent med  list.               amLODipine 5 MG tablet  Commonly known as:  NORVASC  Take 1 tablet (5 mg total) by mouth 2 (two) times daily.     aspirin EC 81 MG tablet  Take 1 tablet (81 mg total) by mouth daily.     canagliflozin 300 MG Tabs tablet  Commonly known as:  INVOKANA  Take 300 mg by mouth daily.     diazepam 10 MG tablet  Commonly known as:  VALIUM  10 mg.     glimepiride 2 MG tablet  Commonly known as:  AMARYL  Take 1 tablet (2 mg total) by mouth daily before supper.     HYDROmorphone 2 MG tablet  Commonly known as:  DILAUDID  as needed.     isosorbide mononitrate 120 MG 24 hr tablet  Commonly known as:  IMDUR  Take 1 tablet (120 mg total) by mouth every morning.     metFORMIN 1000 MG tablet  Commonly known as:  GLUCOPHAGE  Take 1 tablet (1,000 mg total) by mouth 2 (two) times daily with a meal.     metoprolol succinate 25 MG 24 hr tablet  Commonly known as:  TOPROL-XL  Take 1 tablet (25 mg total) by mouth every evening.     nitroGLYCERIN 0.4 MG SL tablet  Commonly known as:  NITROSTAT  Place 1 tablet (0.4 mg total) under the tongue every 5 (five) minutes as needed for chest pain.     pantoprazole 40 MG tablet  Commonly known as:  PROTONIX  Take 1 tablet (40 mg total) by mouth every morning.     rOPINIRole 1 MG tablet  Commonly known as:  REQUIP  Take 1.5 tablets (1.5 mg total) by mouth at bedtime. Take 1 tab by mouth at bedtime (along with the 0.5mg  tab for a total of 1.5mg  daily)     rosuvastatin 10 MG tablet  Commonly known as:  CRESTOR  Take 1 tablet (10 mg total) by mouth daily.     saccharomyces boulardii 250 MG capsule  Commonly known as:  FLORASTOR  Take 1 capsule (250 mg total) by mouth 2 (two) times daily.        Allergies: No Known Allergies  Past Medical History  Diagnosis Date  . Hypertension   . Dyslipidemia  pt unable to tolerate niaspan  . GERD (gastroesophageal reflux disease)   . Diabetes mellitus     followed by Dr.  Dwyane Dee  . History of non-ST elevation myocardial infarction (NSTEMI) 2005  . OSA on CPAP   . Bladder cancer followed by dr Risa Grill  . 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 History of angina CHRONIC -- CONTROLLED W/ IMDUR  . Peripheral neuropathy RIGHT LEG NUMBNESS  . Coronary artery disease     followed by Dr. [name]; Lexiscan Myoview (10/14):  Low risk, EF 57%, small inf infarct  . Coronary vasospasm PER DR ROSS NOTE 03-22-2012--  INTER. TIGHTNESS    PER PT PROBABLE FROM STRESS  . Bladder cancer     Past Surgical History  Procedure Laterality Date  . Umbillical hernia repair  2004  . Elbow surgery  07-07-2003    RIGHT ELBOW ARTHROSCOPY W/ OPEN RECONSTRUCTION  . Shoulder surgery  2005    RIGHT ROTATOR CUFF REPAIR  . Cystoscopy with biopsy  04/07/2011    Procedure: CYSTOSCOPY WITH BIOPSY/ RIGHT URETEROSCOPY;  Surgeon: Bernestine Amass, MD;  Location: [facility];  Service: Urology;  Laterality: N/A;    cystoscopy, cystogram, biopsy and fulgeration  . Cysto/ resection bladder tumor/ left retrograde pyelogram/ right ureteroscopy  08-07-2010    TCC OF BLADDER   S/P DISTAL URETERECTOMY/ REIMPLANTATION  . Cysto/ cystogram/ ureteroscopy  02-21-2009  . Right distal ureterectomy w/ reimplantation  10-30-2008    TCC OF RIGHT DISTAL URETER  . Cysto/ right ureteroscopy/ bx ureteral tumor  10-11-2008  . Cysto/ bladder bx  09-13-2008;  08-13-2007; 04-19-2007; 06-01-2006  . Cysto/ bilateral retrograde pyelogram/ right ureteroscopic laser fulguration ureteral tumor  02-16-2008  . Rhinoplasty w/ modified cartilage graft  05-17-2007    INTERNAL NASAL VALVE COLLAPSE/ OSA/ SEPTAL PERFERATION  . Transurethral resection of bladder tumor  01-13-2007  . Appendectomy  08-03-2004  . Knee arthroscopy w/ debridement  02-08-2004    RIGHT KNEE  . Cardiac catheterization  x3  last one 06-17-2007    MILD NON-OBSTRUCTIVE CAD/ NORMAL LVF/ 30% LEFT RENAL  ARTERY STENOSIS  . Cystoscopy with biopsy  12/29/2011    Procedure: CYSTOSCOPY WITH BIOPSY;  Surgeon: Bernestine Amass, MD;  Location: [facility];  Service: Urology;  Laterality: N/A;  [place]    . Cystoscopy w/ retrogrades  04/19/2012    Procedure: CYSTOSCOPY WITH RETROGRADE PYELOGRAM;  Surgeon: Bernestine Amass, MD;  Location: [facility];  Service: Urology;  Laterality: Bilateral;  30 MINS Cysto, Biopsy, possible TURBT, Bilateral retrograde pyelograms with Mitomycin C instilation Post op   . Transurethral resection of bladder tumor  04/19/2012    Procedure: TRANSURETHRAL RESECTION OF BLADDER TUMOR (TURBT);  Surgeon: Bernestine Amass, MD;  Location: [facility];  Service: Urology;  Laterality: N/A;    Family History  Problem Relation Age of Onset  . Coronary artery disease Father   . Colon cancer Neg Hx   . Stomach cancer Neg Hx   . Diabetes Maternal Grandmother   . Heart disease Paternal Grandfather     Social History:  reports that he quit smoking about 7 years ago. His smoking use included Cigarettes. He has a 10 pack-year smoking history. He has never used smokeless tobacco. He reports that he does not drink alcohol or use illicit drugs.  Review of Systems:  HYPERTENSION:   diastolic is higher today  and is on amlodipine and metoprolol; he thinks blood pressure is better at home He is currently not on ACE inhibitor and has normal microalbumin  HYPERLIPIDEMIA: The lipid abnormality consists of elevated LDL and low HDL, previously controlled with Crestor 3 days a week but he ran out of this 2 months ago  Lab Results  Component Value Date   CHOL 128 08/21/2014   HDL 31.80* 08/21/2014   LDLCALC 70 08/21/2014   TRIG 130.0 08/21/2014   CHOLHDL 4 08/21/2014   He  has had abnormal liver functions since about 2012 His cardiologist had evaluated this with hepatitis studies previously and these were negative His liver function  abnormality appears to be somewhat intermittent No recent ultrasound of abdomen available  However even though  he has not taken Crestor for about 2 months because he did not have a prescription his liver functions are still high and about the same       Examination:   BP 134/88 mmHg  Pulse 66  Temp(Src) 98.1 F (36.7 C)  Resp 16  Ht 5\' 9"  (1.753 m)  Wt 201 lb 12.8 oz (91.536 kg)  BMI 29.79 kg/m2  SpO2 96%  Body mass index is 29.79 kg/(m^2).   No pedal edema  ASSESSMENT/ PLAN:   Diabetes type 2   The patient's diabetes control is  reasonably good with A1c 7% However his blood sugars are relatively higher compared to the last visit and inconsistent This is from variable diet Has not been as active and has gained a little weight   Since A1c is fairly good and he can do better with diet will not change his 3 drug regimen as yet  Abnormal liver functions:  Etiology unclear and does not appear to be from [place] him to get  GI consultation  Also advised him to follow-up with his PCP for general wellness exam  [facility] 12/25/2014, 8:32 PM   Note: This office note was prepared with Dragon voice recognition system technology. Any transcriptional errors that result from this process are unintentional.

## 2014-12-26 ENCOUNTER — Other Ambulatory Visit: Payer: Self-pay | Admitting: *Deleted

## 2014-12-26 MED ORDER — ROPINIROLE HCL 1 MG PO TABS: 1.5000 mg | ORAL_TABLET | Freq: Every day | ORAL | Status: DC

## 2015-01-05 ENCOUNTER — Encounter: Payer: Self-pay | Admitting: Internal Medicine

## 2015-01-25 ENCOUNTER — Ambulatory Visit (INDEPENDENT_AMBULATORY_CARE_PROVIDER_SITE_OTHER): Payer: Medicare Other | Admitting: Gastroenterology

## 2015-01-25 ENCOUNTER — Encounter: Payer: Self-pay | Admitting: Gastroenterology

## 2015-01-25 ENCOUNTER — Other Ambulatory Visit (INDEPENDENT_AMBULATORY_CARE_PROVIDER_SITE_OTHER): Payer: Medicare Other

## 2015-01-25 VITALS — BP 106/64 | HR 72 | Ht 69.0 in | Wt 198.0 lb

## 2015-01-25 DIAGNOSIS — R945 Abnormal results of liver function studies: Principal | ICD-10-CM

## 2015-01-25 DIAGNOSIS — R7989 Other specified abnormal findings of blood chemistry: Secondary | ICD-10-CM | POA: Diagnosis not present

## 2015-01-25 LAB — IBC PANEL
Iron: 141 ug/dL (ref 42–165)
SATURATION RATIOS: 34.5 % (ref 20.0–50.0)
TRANSFERRIN: 292 mg/dL (ref 212.0–360.0)

## 2015-01-25 LAB — PROTIME-INR
INR: 1.1 ratio — ABNORMAL HIGH (ref 0.8–1.0)
Prothrombin Time: 12.1 s (ref 9.6–13.1)

## 2015-01-25 LAB — FERRITIN: FERRITIN: 62.3 ng/mL (ref 22.0–322.0)

## 2015-01-25 LAB — IGA: IgA: 232 mg/dL (ref 68–378)

## 2015-01-25 NOTE — Progress Notes (Signed)
    History of Present Illness: This is a 59 year old male referred by Elayne Snare, MD for the evaluation of elevated transaminases. Chart review shows mildly elevated transaminases dating back to 2014. Most recently AST=40 and ALT=77. He has long-standing diabetes mellitus. He has been treated with Crestor for several years and this was stopped 3 or 4 months ago however his elevated transaminases persist. No history of liver disease, hepatitis transfusions or family history of liver disease. He has occasional loose stools. He feels that the regular use of probiotics substantially reduces his loose stools. Denies weight loss, abdominal pain, constipation, change in stool caliber, melena, hematochezia, nausea, vomiting, dysphagia, reflux symptoms, chest pain.  Review of Systems: Pertinent positive and negative review of systems were noted in the above HPI section. All other review of systems were otherwise negative.  Current Medications, Allergies, Past Medical History, Past Surgical History, Family History and Social History were reviewed in [employer] record.  Physical Exam: General: Well developed, well nourished, no acute distress Head: Normocephalic and atraumatic Eyes:  sclerae anicteric, EOMI Ears: Normal auditory acuity Mouth: No deformity or lesions Neck: Supple, no masses or thyromegaly Lungs: Clear throughout to auscultation Heart: Regular rate and rhythm; no murmurs, rubs or bruits Abdomen: Soft, non tender and non distended. No masses, hepatosplenomegaly or hernias noted. Normal Bowel sounds Musculoskeletal: Symmetrical with no gross deformities  Skin: No lesions on visible extremities Pulses:  Normal pulses noted Extremities: No clubbing, cyanosis, edema or deformities noted Neurological: Alert oriented x 4, grossly nonfocal Cervical Nodes:  No significant cervical adenopathy Inguinal Nodes: No significant inguinal adenopathy Psychological:  Alert and  cooperative. Normal mood and affect  Assessment and Recommendations:  1. Mildly elevated transaminases. Rule out hepatic steatosis, medication side effect, chronic viral hepatitis and other etiologies. Statin side effect unlikely as LFTs have remained elevated despite stopping Crestor. Schedule abdominal ultrasound. Obtain standard hepatic serologies. We will contact patient with further plans based on these results.  2. Diarrhea, likely functional. Continue daily probiotic. Avoid foods that assess abate symptoms.  3. Personal history of adenomatous colon polyps. Five-year interval surveillance colonoscopy due October 2018.   cc: Elayne Snare, MD [place] [address] [place] [place], [place] [id number]

## 2015-01-25 NOTE — Patient Instructions (Signed)
Your physician has requested that you go to the basement for lab work before leaving today.  You have been scheduled for an abdominal ultrasound at [facility] Radiology (1st floor of hospital) on 01-29-2015 at [place]. Please arrive 15 minutes prior to your appointment for registration. Make certain not to have anything to eat or drink 6 hours prior to your appointment. Should you need to reschedule your appointment, please contact radiology at [phone]. This test typically takes about 30 minutes to perform.  CC: Elayne Snare, MD

## 2015-01-26 LAB — HEPATITIS B SURFACE ANTIGEN: Hepatitis B Surface Ag: NEGATIVE

## 2015-01-26 LAB — ANA: Anti Nuclear Antibody(ANA): NEGATIVE

## 2015-01-26 LAB — HEPATITIS B SURFACE ANTIBODY,QUALITATIVE: Hep B S Ab: NEGATIVE

## 2015-01-26 LAB — TISSUE TRANSGLUTAMINASE, IGA: Tissue Transglutaminase Ab, IgA: 1 U/mL (ref <4)

## 2015-01-26 LAB — HEPATITIS C ANTIBODY: HCV Ab: NEGATIVE

## 2015-01-26 LAB — ANTI-SMOOTH MUSCLE ANTIBODY, IGG: SMOOTH MUSCLE AB: 14 U (ref <20)

## 2015-01-29 ENCOUNTER — Ambulatory Visit (HOSPITAL_COMMUNITY)
Admission: RE | Admit: 2015-01-29 | Discharge: 2015-01-29 | Disposition: A | Discharge status: Home / Self Care | Payer: Medicare Other | Source: Ambulatory Visit | Attending: Gastroenterology | Admitting: Gastroenterology

## 2015-01-29 DIAGNOSIS — R945 Abnormal results of liver function studies: Secondary | ICD-10-CM

## 2015-01-29 DIAGNOSIS — R7989 Other specified abnormal findings of blood chemistry: Secondary | ICD-10-CM

## 2015-01-29 DIAGNOSIS — R932 Abnormal findings on diagnostic imaging of liver and biliary tract: Secondary | ICD-10-CM | POA: Diagnosis not present

## 2015-01-29 LAB — MITOCHONDRIAL ANTIBODIES: MITOCHONDRIAL M2 AB, IGG: 0.8 (ref <0.91)

## 2015-01-29 LAB — ALPHA-1-ANTITRYPSIN: A1 ANTITRYPSIN SER: 133 mg/dL (ref 83–199)

## 2015-01-29 LAB — CERULOPLASMIN: CERULOPLASMIN: 19 mg/dL (ref 18–36)

## 2015-02-15 ENCOUNTER — Encounter: Payer: Self-pay | Admitting: Internal Medicine

## 2015-02-15 ENCOUNTER — Ambulatory Visit (INDEPENDENT_AMBULATORY_CARE_PROVIDER_SITE_OTHER): Payer: Medicare Other | Admitting: Internal Medicine

## 2015-02-15 VITALS — BP 102/70 | HR 65 | Ht 70.0 in | Wt 201.6 lb

## 2015-02-15 DIAGNOSIS — Z23 Encounter for immunization: Secondary | ICD-10-CM

## 2015-02-15 DIAGNOSIS — I10 Essential (primary) hypertension: Secondary | ICD-10-CM

## 2015-02-15 MED ORDER — ROSUVASTATIN CALCIUM 10 MG PO TABS: 10.0000 mg | ORAL_TABLET | ORAL | Status: DC

## 2015-02-15 NOTE — Progress Notes (Signed)
Cardiology Office Note   Date:  02/15/2015   ID:  JAXSTEN ATCITTY, DOB 21-Sep-1955, MRN [id number]  PCP:  Elby Showers, MD  Cardiologist:   [name], MD   No chief complaint on file.     History of Present Illness: Isaac [name] is a 59 y.o. 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 I last Tired all the time   Occasional CP  Not associated with activity Spells infrequent Biggest problem was kneels  No ntg  Feet cold a lot   This is new for him       Current Outpatient Prescriptions  Medication Sig Dispense Refill  . amLODipine (NORVASC) 5 MG tablet Take 1 tablet (5 mg total) by mouth 2 (two) times daily. 180 tablet 0  . aspirin EC 81 MG tablet Take 1 tablet (81 mg total) by mouth daily.    . canagliflozin (INVOKANA) 300 MG TABS tablet Take 300 mg by mouth daily. 90 tablet 1  . diazepam (VALIUM) 10 MG tablet Take 10 mg by mouth every 6 (six) hours as needed.     Marland Kitchen glimepiride (AMARYL) 2 MG tablet Take 1 tablet (2 mg total) by mouth daily before supper. 90 tablet 1  . HYDROmorphone (DILAUDID) 2 MG tablet Take 2 mg by mouth as needed for moderate pain.     . isosorbide mononitrate (IMDUR) 120 MG 24 hr tablet Take 1 tablet (120 mg total) by mouth every morning. 90 tablet 0  . metFORMIN (GLUCOPHAGE) 1000 MG tablet Take 1 tablet (1,000 mg total) by mouth 2 (two) times daily with a meal. 180 tablet 1  . metoprolol succinate (TOPROL-XL) 25 MG 24 hr tablet Take 1 tablet (25 mg total) by mouth every evening. 90 tablet 0  . nitroGLYCERIN (NITROSTAT) 0.4 MG SL tablet Place 1 tablet (0.4 mg total) under the tongue every 5 (five) minutes as needed for chest pain. 90 tablet 3  . pantoprazole (PROTONIX) 40 MG tablet Take 1 tablet (40 mg total) by mouth every morning. 90 tablet 0  . rOPINIRole (REQUIP) 1 MG tablet Take 1.5 tablets (1.5 mg total) by mouth at bedtime. Take 1  tab by mouth at bedtime (along with the 0.5mg  tab for a total of 1.5mg  daily) 135 tablet 4  . rosuvastatin (CRESTOR) 10 MG tablet Take 1 tablet (10 mg total) by mouth daily. 90 tablet 1   No current facility-administered medications for this visit.    Allergies:   Review of patient's allergies indicates no known allergies.   Past Medical History  Diagnosis Date  . Hypertension   . Dyslipidemia     pt unable to tolerate niaspan  . GERD (gastroesophageal reflux disease)   . Diabetes mellitus     followed by Dr. Dwyane Dee  . History of non-ST elevation myocardial infarction (NSTEMI) 2005  . OSA on CPAP   . Bladder cancer [facility]) followed by dr Risa Grill  . 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 History of angina CHRONIC -- CONTROLLED W/ IMDUR  . Peripheral neuropathy (HCC) RIGHT LEG NUMBNESS  . Coronary artery disease     followed by Dr. [name]; Lexiscan Myoview (10/14):  Low risk, EF 57%, small inf infarct  . Coronary vasospasm ([place]) PER DR Rilla Buckman NOTE 03-22-2012--  INTER. TIGHTNESS    PER PT PROBABLE  FROM STRESS  . Bladder cancer [facility])     Past Surgical History  Procedure Laterality Date  . Umbillical hernia repair  2004  . Elbow surgery  07-07-2003    RIGHT ELBOW ARTHROSCOPY W/ OPEN RECONSTRUCTION  . Shoulder surgery  2005    RIGHT ROTATOR CUFF REPAIR  . Cystoscopy with biopsy  04/07/2011    Procedure: CYSTOSCOPY WITH BIOPSY/ RIGHT URETEROSCOPY;  Surgeon: Bernestine Amass, MD;  Location: [facility];  Service: Urology;  Laterality: N/A;    cystoscopy, cystogram, biopsy and fulgeration  . Cysto/ resection bladder tumor/ left retrograde pyelogram/ right ureteroscopy  08-07-2010    TCC OF BLADDER   S/P DISTAL URETERECTOMY/ REIMPLANTATION  . Cysto/ cystogram/ ureteroscopy  02-21-2009  . Right distal ureterectomy w/ reimplantation  10-30-2008    TCC OF RIGHT DISTAL URETER  . Cysto/ right ureteroscopy/ bx ureteral tumor   10-11-2008  . Cysto/ bladder bx  09-13-2008;  08-13-2007; 04-19-2007; 06-01-2006  . Cysto/ bilateral retrograde pyelogram/ right ureteroscopic laser fulguration ureteral tumor  02-16-2008  . Rhinoplasty w/ modified cartilage graft  05-17-2007    INTERNAL NASAL VALVE COLLAPSE/ OSA/ SEPTAL PERFERATION  . Transurethral resection of bladder tumor  01-13-2007  . Appendectomy  08-03-2004  . Knee arthroscopy w/ debridement  02-08-2004    RIGHT KNEE  . Cardiac catheterization  x3  last one 06-17-2007    MILD NON-OBSTRUCTIVE CAD/ NORMAL LVF/ 30% LEFT RENAL ARTERY STENOSIS  . Cystoscopy with biopsy  12/29/2011    Procedure: CYSTOSCOPY WITH BIOPSY;  Surgeon: Bernestine Amass, MD;  Location: [facility];  Service: Urology;  Laterality: N/A;  [place]    . Cystoscopy w/ retrogrades  04/19/2012    Procedure: CYSTOSCOPY WITH RETROGRADE PYELOGRAM;  Surgeon: Bernestine Amass, MD;  Location: [facility];  Service: Urology;  Laterality: Bilateral;  30 MINS Cysto, Biopsy, possible TURBT, Bilateral retrograde pyelograms with Mitomycin C instilation Post op   . Transurethral resection of bladder tumor  04/19/2012    Procedure: TRANSURETHRAL RESECTION OF BLADDER TUMOR (TURBT);  Surgeon: Bernestine Amass, MD;  Location[facility];  Service: Urology;  Laterality: N/A;     Social History:  The patient  reports that he quit smoking about 7 years ago. His smoking use included Cigarettes. He has a 10 pack-year smoking history. He has never used smokeless tobacco. He reports that he does not drink alcohol or use illicit drugs.   Family History:  The patient's family history includes Coronary artery disease in his father; Diabetes in his maternal grandmother; Heart disease in his paternal grandfather. There is no history of Colon cancer or Stomach cancer.    ROS:  Please see the history of present illness. All other systems are reviewed and  Negative to the above  problem except as noted.    PHYSICAL EXAM: VS:  BP 102/70 mmHg  Pulse 65  Ht 5\' 10"  (1.778 m)  Wt 201 lb 9.6 oz (91.445 kg)  BMI 28.93 kg/m2  SpO2 96%  GEN: Well nourished, well developed, in no acute distress HEENT: normal Neck: no JVD, carotid bruits, or masses Cardiac: RRR; no murmurs, rubs, or gallops,no edema  Respiratory:  clear to auscultation bilaterally, normal work of breathing GI: soft, nontender, nondistended, + BS  No hepatomegaly  MS: no deformity Moving all extremities   Skin: warm and dry, no rash Neuro:  Strength and sensation are intact Psych: euthymic mood, full affect  EKG:  EKG is ordered today  SR 65 bpm  Occasional PACss    Lipid Panel    Component Value Date/Time   CHOL 128 08/21/2014 1013   TRIG 130.0 08/21/2014 1013   HDL 31.80* 08/21/2014 1013   CHOLHDL 4 08/21/2014 1013   VLDL 26.0 08/21/2014 1013   LDLCALC 70 08/21/2014 1013      Wt Readings from Last 3 Encounters:  02/15/15 201 lb 9.6 oz (91.445 kg)  01/25/15 198 lb (89.812 kg)  12/25/14 201 lb 12.8 oz (91.536 kg)      ASSESSMENT AND PLAN: 1  Cardiac  CP he is having is atypical  I dont think represents angina.  FOllow    Continue current regimen    2.  HL  Last LDL was 70  HDL 32  Stay active    3.  HTN  Good control    I encouraged him to increase his physcial activity on regular basis.   F/u in 1 year    Signed, [name], MD  02/15/2015 10:27 AM    [address], [place], [place]  [address] Phone: [phone]; Fax: [phone]

## 2015-02-15 NOTE — Patient Instructions (Signed)
Your physician recommends that you continue on your current medications as directed. Please refer to the Current Medication list given to you today. Your physician wants you to follow-up in: [place].  You will receive a reminder letter in the mail two months in advance. If you don't receive a letter, please call our office to schedule the follow-up appointment.

## 2015-02-22 ENCOUNTER — Telehealth: Payer: Self-pay | Admitting: Internal Medicine

## 2015-02-22 ENCOUNTER — Telehealth: Payer: Self-pay | Admitting: Endocrinology

## 2015-02-22 NOTE — Telephone Encounter (Signed)
New message     STAT if patient is at the pharmacy , call can be transferred to refill team.   1. Which medications need to be refilled? Amlodipine 5 mg, metoprolol 25 mg , pantoprazole  40 mg isosorbide 120 mg   2. Which pharmacy/location is medication to be sent to? [facility]    3. Do they need a 30 day or 90 day supply?  90 day supply

## 2015-02-22 NOTE — Telephone Encounter (Signed)
Patient called stating that he is in the process for (L) knee surgery  Dr. Dwyane Dee would need to provide clearence for Isaac [name]   Please advise    Thank you

## 2015-02-23 ENCOUNTER — Other Ambulatory Visit: Payer: Self-pay | Admitting: *Deleted

## 2015-02-23 MED ORDER — ISOSORBIDE MONONITRATE ER 120 MG PO TB24: 120.0000 mg | ORAL_TABLET | Freq: Every morning | ORAL | Status: DC

## 2015-02-23 MED ORDER — PANTOPRAZOLE SODIUM 40 MG PO TBEC
40.0000 mg | DELAYED_RELEASE_TABLET | Freq: Every morning | ORAL | Status: DC
Start: 2015-02-23 — End: 2016-03-31

## 2015-02-23 MED ORDER — METOPROLOL SUCCINATE ER 25 MG PO TB24: 25.0000 mg | ORAL_TABLET | Freq: Every evening | ORAL | Status: DC

## 2015-02-23 MED ORDER — AMLODIPINE BESYLATE 5 MG PO TABS: 5.0000 mg | ORAL_TABLET | Freq: Two times a day (BID) | ORAL | Status: DC

## 2015-02-23 NOTE — Telephone Encounter (Signed)
Please see below.

## 2015-02-23 NOTE — Telephone Encounter (Signed)
Complete

## 2015-02-23 NOTE — Telephone Encounter (Signed)
Faxed

## 2015-04-23 ENCOUNTER — Other Ambulatory Visit (INDEPENDENT_AMBULATORY_CARE_PROVIDER_SITE_OTHER): Payer: Medicare Other

## 2015-04-23 DIAGNOSIS — IMO0002 Reserved for concepts with insufficient information to code with codable children: Secondary | ICD-10-CM

## 2015-04-23 DIAGNOSIS — E1165 Type 2 diabetes mellitus with hyperglycemia: Secondary | ICD-10-CM | POA: Diagnosis not present

## 2015-04-23 LAB — BASIC METABOLIC PANEL
BUN: 15 mg/dL (ref 6–23)
CALCIUM: 9 mg/dL (ref 8.4–10.5)
CHLORIDE: 103 meq/L (ref 96–112)
CO2: 28 meq/L (ref 19–32)
CREATININE: 1.07 mg/dL (ref 0.40–1.50)
GFR: 74.99 mL/min (ref >60.00)
Glucose, Bld: 138 mg/dL — ABNORMAL HIGH (ref 70–99)
Potassium: 4.2 mEq/L (ref 3.5–5.1)
Sodium: 137 mEq/L (ref 135–145)

## 2015-04-23 LAB — HEMOGLOBIN A1C: Hgb A1c MFr Bld: 8.5 % — ABNORMAL HIGH (ref 4.6–6.5)

## 2015-04-26 ENCOUNTER — Ambulatory Visit (INDEPENDENT_AMBULATORY_CARE_PROVIDER_SITE_OTHER): Payer: Medicare Other | Admitting: Endocrinology

## 2015-04-26 ENCOUNTER — Encounter: Payer: Self-pay | Admitting: Endocrinology

## 2015-04-26 ENCOUNTER — Other Ambulatory Visit: Payer: Self-pay | Admitting: Endocrinology

## 2015-04-26 VITALS — BP 130/80 | HR 73 | Temp 98.6°F | Resp 14 | Ht 70.0 in | Wt 182.2 lb

## 2015-04-26 DIAGNOSIS — E1165 Type 2 diabetes mellitus with hyperglycemia: Secondary | ICD-10-CM | POA: Diagnosis not present

## 2015-04-26 DIAGNOSIS — Z23 Encounter for immunization: Secondary | ICD-10-CM

## 2015-04-26 NOTE — Patient Instructions (Signed)
Take 300mg  invokana daily  Check blood sugars on waking up 3-4  times a week Also check blood sugars about 2 hours after a meal and do this after different meals by rotation  Recommended blood sugar levels on waking up is 90-130 and about 2 hours after meal is 130-160  Please bring your blood sugar monitor to each visit, thank you

## 2015-04-26 NOTE — Progress Notes (Signed)
Patient ID: Isaac [name], male   DOB: 10-Mar-1956, 59 y.o.   MRN: [id number]   Reason for Appointment: Diabetes follow-up   History of Present Illness   Diagnosis: Type 2 DIABETES MELITUS, long-standing      He has been on various regimens of treatment in the past including mealtime insulin, Victoza and Januvia Previously he has had difficulty tolerating Victoza and also large doses of metformin He appears to have benefited  from using [place] in 2014 with improved control and some weight loss Subsequently with using low dose Amaryl he was still getting some hypoglycemia; in 10/14  he was told to stop his Amaryl Because of relatively high readings  in 3/15 especially late evening and fasting he was restarted on Amaryl, half of a 2 mg tablet at suppertime  Recent history: His A1c is significantly higher at 8.5% compared to 7.0 This is despite only a mild increase in his blood sugar readings at home He is taking metformin 1 g twice a day without any GI side effects He is only taking 1 mg Amaryl at suppertime   His current blood sugar patterns, management and problems identified:   His fasting blood sugars are mostly higher although somewhat variable  He thinks that some of his high readings at waking up time is from late night snacks  He tends to eat more sweets at times and overall does not think his diet has been as good  He has lost a significant amount of weight which he thinks is from overall cutting back on calories and trying to walk more in preparation for his knee surgery  Oral hypoglycemic drugs: Invokana 150mg , metformin 1 g twice a day, Amaryl 1 mg ACS         Side effects from medications: nausea with Victoza         Monitors blood glucose: 1-2 times a day.    Glucometer: One Touch.          Blood Glucose readings from meter download:   Mean values apply above for all meters except median for One Touch  PRE-MEAL Fasting Lunch Dinner Bedtime Overall    Glucose range:  94-190    84-146   78-242   78-242   Mean/median:  155    116   140  148    Hypoglycemia: He has not had any  .          Meals: 2- 3 meals per day. Dinner may 8-9 pm Physical activity: exercise:  limited by knee joint pain          Dietician visit: Most recent:3-4 years ago          Complications: are: coronary artery disease     Wt Readings from Last 3 Encounters:  04/26/15 182 lb 3.2 oz (82.645 kg)  02/15/15 201 lb 9.6 oz (91.445 kg)  01/25/15 198 lb (89.812 kg)    Lab Results  Component Value Date   HGBA1C 8.5* 04/23/2015   HGBA1C 7.0* 12/20/2014   HGBA1C 6.8* 08/21/2014   Lab Results  Component Value Date   MICROALBUR 0.8 05/22/2014   LDLCALC 70 08/21/2014   CREATININE 1.07 04/23/2015     Lab on 04/23/2015  Component Date Value Ref Range Status  . Hgb A1c MFr Bld 04/23/2015 8.5* 4.6 - 6.5 % Final   Glycemic Control Guidelines for People with Diabetes:Non Diabetic:  <6%Goal of Therapy: <7%Additional Action Suggested:  >8%   . Sodium 04/23/2015 137  135 - 145 mEq/L Final  . Potassium 04/23/2015 4.2  3.5 - 5.1 mEq/L Final  . Chloride 04/23/2015 103  96 - 112 mEq/L Final  . CO2 04/23/2015 28  19 - 32 mEq/L Final  . Glucose, Bld 04/23/2015 138* 70 - 99 mg/dL Final  . BUN 04/23/2015 15  6 - 23 mg/dL Final  . Creatinine, Ser 04/23/2015 1.07  0.40 - 1.50 mg/dL Final  . Calcium 04/23/2015 9.0  8.4 - 10.5 mg/dL Final  . GFR 04/23/2015 74.99  >60.00 mL/min Final      Medication List       This list is accurate as of: 04/26/15 12:24 PM.  Always use your most recent med list.               amLODipine 5 MG tablet  Commonly known as:  NORVASC  Take 1 tablet (5 mg total) by mouth 2 (two) times daily.     aspirin EC 81 MG tablet  Take 1 tablet (81 mg total) by mouth daily.     canagliflozin 300 MG Tabs tablet  Commonly known as:  INVOKANA  Take 300 mg by mouth daily.     diazepam 10 MG tablet  Commonly known as:  VALIUM  Take 10 mg by mouth  every 6 (six) hours as needed.     glimepiride 2 MG tablet  Commonly known as:  AMARYL  Take 1 tablet (2 mg total) by mouth daily before supper.     HYDROmorphone 2 MG tablet  Commonly known as:  DILAUDID  Take 2 mg by mouth as needed for moderate pain.     isosorbide mononitrate 120 MG 24 hr tablet  Commonly known as:  IMDUR  Take 1 tablet (120 mg total) by mouth every morning.     metFORMIN 1000 MG tablet  Commonly known as:  GLUCOPHAGE  Take 1 tablet (1,000 mg total) by mouth 2 (two) times daily with a meal.     metoprolol succinate 25 MG 24 hr tablet  Commonly known as:  TOPROL-XL  Take 1 tablet (25 mg total) by mouth every evening.     nitroGLYCERIN 0.4 MG SL tablet  Commonly known as:  NITROSTAT  Place 1 tablet (0.4 mg total) under the tongue every 5 (five) minutes as needed for chest pain.     pantoprazole 40 MG tablet  Commonly known as:  PROTONIX  Take 1 tablet (40 mg total) by mouth every morning.     rOPINIRole 1 MG tablet  Commonly known as:  REQUIP  Take 1.5 tablets (1.5 mg total) by mouth at bedtime. Take 1 tab by mouth at bedtime (along with the 0.5mg  tab for a total of 1.5mg  daily)     rosuvastatin 10 MG tablet  Commonly known as:  CRESTOR  Take 1 tablet (10 mg total) by mouth every other day.        Allergies: No Known Allergies  Past Medical History  Diagnosis Date  . Hypertension   . Dyslipidemia     pt unable to tolerate niaspan  . GERD (gastroesophageal reflux disease)   . Diabetes mellitus     followed by Dr. Dwyane Dee  . History of non-ST elevation myocardial infarction (NSTEMI) 2005  . OSA on CPAP   . Bladder cancer [facility]) followed by dr Risa Grill  . 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 History of angina CHRONIC -- CONTROLLED W/ IMDUR  . Peripheral neuropathy (HCC) RIGHT  LEG NUMBNESS  . Coronary artery disease     followed by Dr. [name]; Lexiscan Myoview (10/14):  Low risk, EF 57%, small inf  infarct  . Coronary vasospasm ([place]) PER DR ROSS NOTE 03-22-2012--  INTER. TIGHTNESS    PER PT PROBABLE FROM STRESS  . Bladder cancer [facility])     Past Surgical History  Procedure Laterality Date  . Umbillical hernia repair  2004  . Elbow surgery  07-07-2003    RIGHT ELBOW ARTHROSCOPY W/ OPEN RECONSTRUCTION  . Shoulder surgery  2005    RIGHT ROTATOR CUFF REPAIR  . Cystoscopy with biopsy  04/07/2011    Procedure: CYSTOSCOPY WITH BIOPSY/ RIGHT URETEROSCOPY;  Surgeon: Bernestine Amass, MD;  Location: [facility];  Service: Urology;  Laterality: N/A;    cystoscopy, cystogram, biopsy and fulgeration  . Cysto/ resection bladder tumor/ left retrograde pyelogram/ right ureteroscopy  08-07-2010    TCC OF BLADDER   S/P DISTAL URETERECTOMY/ REIMPLANTATION  . Cysto/ cystogram/ ureteroscopy  02-21-2009  . Right distal ureterectomy w/ reimplantation  10-30-2008    TCC OF RIGHT DISTAL URETER  . Cysto/ right ureteroscopy/ bx ureteral tumor  10-11-2008  . Cysto/ bladder bx  09-13-2008;  08-13-2007; 04-19-2007; 06-01-2006  . Cysto/ bilateral retrograde pyelogram/ right ureteroscopic laser fulguration ureteral tumor  02-16-2008  . Rhinoplasty w/ modified cartilage graft  05-17-2007    INTERNAL NASAL VALVE COLLAPSE/ OSA/ SEPTAL PERFERATION  . Transurethral resection of bladder tumor  01-13-2007  . Appendectomy  08-03-2004  . Knee arthroscopy w/ debridement  02-08-2004    RIGHT KNEE  . Cardiac catheterization  x3  last one 06-17-2007    MILD NON-OBSTRUCTIVE CAD/ NORMAL LVF/ 30% LEFT RENAL ARTERY STENOSIS  . Cystoscopy with biopsy  12/29/2011    Procedure: CYSTOSCOPY WITH BIOPSY;  Surgeon: Bernestine Amass, MD;  Location: [facility];  Service: Urology;  Laterality: N/A;  [place]    . Cystoscopy w/ retrogrades  04/19/2012    Procedure: CYSTOSCOPY WITH RETROGRADE PYELOGRAM;  Surgeon: Bernestine Amass, MD;  Location: [facility];  Service: Urology;   Laterality: Bilateral;  30 MINS Cysto, Biopsy, possible TURBT, Bilateral retrograde pyelograms with Mitomycin C instilation Post op   . Transurethral resection of bladder tumor  04/19/2012    Procedure: TRANSURETHRAL RESECTION OF BLADDER TUMOR (TURBT);  Surgeon: Bernestine Amass, MD;  Location: [facility];  Service: Urology;  Laterality: N/A;    Family History  Problem Relation Age of Onset  . Coronary artery disease Father   . Colon cancer Neg Hx   . Stomach cancer Neg Hx   . Diabetes Maternal Grandmother   . Heart disease Paternal Grandfather     Social History:  reports that he quit smoking about 7 years ago. His smoking use included Cigarettes. He has a 10 pack-year smoking history. He has never used smokeless tobacco. He reports that he does not drink alcohol or use illicit drugs.  Review of Systems:  HYPERTENSION:   on treatment and is on amlodipine and metoprolol; he thinks blood pressure is better at home He is currently not on ACE inhibitor and has normal microalbumin  HYPERLIPIDEMIA: The lipid abnormality consists of elevated LDL and low HDL, previously controlled with Crestor 3 days a week  Has not had any recent follow-up  Lab Results  Component Value Date   CHOL 128 08/21/2014   HDL 31.80* 08/21/2014   LDLCALC 70 08/21/2014   TRIG  130.0 08/21/2014   CHOLHDL 4 08/21/2014   He  has had abnormal liver functions since about 2012 His cardiologist had evaluated this with hepatitis studies previously and these were negative His liver function abnormality appears to be somewhat intermittent Recent ultrasound shows fatty liver and apparently all other studies were negative from gastroenterologist  Lab Results  Component Value Date   ALT 77* 12/20/2014        Examination:   BP 130/80 mmHg  Pulse 73  Temp(Src) 98.6 F (37 C)  Resp 14  Ht 5\' 10"  (1.778 m)  Wt 182 lb 3.2 oz (82.645 kg)  BMI 26.14 kg/m2  SpO2 99%  Body mass index is 26.14 kg/(m^2).     No pedal edema  ASSESSMENT/ PLAN:   Diabetes type 2   The patient's diabetes control is significantly worse with A1c 8.5 This is despite no consistent high blood sugars at home See history of present illness for detailed discussion of his current management, blood sugar patterns and problems identified He does think that his diet has been inconsistent in type of meals that he is eating and getting more sweets and foods like cereals especially at night after supper This is despite his losing weight with overall being more active and cutting back on portions  For now will have him try to improve his diet especially with carbohydrates and sweets as well as avoiding foods like cereal at night He will increase his Invokana to 300 mg for now  Abnormal liver functions: May improve with weight loss and improved blood sugar, will follow-up on next visit  Also advised him to follow-up with his PCP for general wellness exam Prevnar given today  [facility] 04/26/2015, 12:24 PM   Note: This office note was prepared with Dragon voice recognition system technology. Any transcriptional errors that result from this process are unintentional.

## 2015-05-01 ENCOUNTER — Ambulatory Visit: Payer: Self-pay | Admitting: Physician Assistant

## 2015-05-01 NOTE — H&P (Signed)
TOTAL KNEE ADMISSION H&P  Patient is being admitted for left total knee arthroplasty.  Subjective:  Chief Complaint:left knee pain.  HPI: Isaac [name], 59 y.o. male, has a history of pain and functional disability in the left knee due to arthritis and has failed non-surgical conservative treatments for greater than 12 weeks to includeNSAID's and/or analgesics and activity modification.  Onset of symptoms was gradual, starting 5 years ago with gradually worsening course since that time. The patient noted no past surgery on the left knee(s).  Patient currently rates pain in the left knee(s) at 9 out of 10 with activity. Patient has night pain, worsening of pain with activity and weight bearing, pain that interferes with activities of daily living, pain with passive range of motion, crepitus and joint swelling.  Patient has evidence of periarticular osteophytes and joint space narrowing by imaging studies. There is no active infection.  Patient Active Problem List   Diagnosis Date Noted  . Personal history of colonic polyps 09/07/2013  . Type II or unspecified type diabetes mellitus without mention of complication, not stated as uncontrolled 06/19/2011  . Anxiety and depression 06/19/2011  . History of skin cancer 06/19/2011  . BLADDER CANCER , UNSPEC. 05/10/2008  . HYPERLIPIDEMIA-MIXED 05/10/2008  . CAD, NATIVE VESSEL 05/10/2008  . RESTLESS LEGS SYNDROME 11/17/2007  . Essential hypertension 11/16/2007  . OSA (obstructive sleep apnea) 11/16/2007   Past Medical History  Diagnosis Date  . Hypertension   . Dyslipidemia     pt unable to tolerate niaspan  . GERD (gastroesophageal reflux disease)   . Diabetes mellitus     followed by Dr. Dwyane Dee  . History of non-ST elevation myocardial infarction (NSTEMI) 2005  . OSA on CPAP   . Bladder cancer [facility]) followed by dr Risa Grill  . 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 History of angina CHRONIC --  CONTROLLED W/ IMDUR  . Peripheral neuropathy (HCC) RIGHT LEG NUMBNESS  . Coronary artery disease     followed by Dr. [name]; Lexiscan Myoview (10/14):  Low risk, EF 57%, small inf infarct  . Coronary vasospasm ([place]) PER DR ROSS NOTE 03-22-2012--  INTER. TIGHTNESS    PER PT PROBABLE FROM STRESS  . Bladder cancer [facility])     Past Surgical History  Procedure Laterality Date  . Umbillical hernia repair  2004  . Elbow surgery  07-07-2003    RIGHT ELBOW ARTHROSCOPY W/ OPEN RECONSTRUCTION  . Shoulder surgery  2005    RIGHT ROTATOR CUFF REPAIR  . Cystoscopy with biopsy  04/07/2011    Procedure: CYSTOSCOPY WITH BIOPSY/ RIGHT URETEROSCOPY;  Surgeon: Bernestine Amass, MD;  Location: [facility];  Service: Urology;  Laterality: N/A;    cystoscopy, cystogram, biopsy and fulgeration  . Cysto/ resection bladder tumor/ left retrograde pyelogram/ right ureteroscopy  08-07-2010    TCC OF BLADDER   S/P DISTAL URETERECTOMY/ REIMPLANTATION  . Cysto/ cystogram/ ureteroscopy  02-21-2009  . Right distal ureterectomy w/ reimplantation  10-30-2008    TCC OF RIGHT DISTAL URETER  . Cysto/ right ureteroscopy/ bx ureteral tumor  10-11-2008  . Cysto/ bladder bx  09-13-2008;  08-13-2007; 04-19-2007; 06-01-2006  . Cysto/ bilateral retrograde pyelogram/ right ureteroscopic laser fulguration ureteral tumor  02-16-2008  . Rhinoplasty w/ modified cartilage graft  05-17-2007    INTERNAL NASAL VALVE COLLAPSE/ OSA/ SEPTAL PERFERATION  . Transurethral resection of bladder tumor  01-13-2007  . Appendectomy  08-03-2004  . Knee arthroscopy w/  debridement  02-08-2004    RIGHT KNEE  . Cardiac catheterization  x3  last one 06-17-2007    MILD NON-OBSTRUCTIVE CAD/ NORMAL LVF/ 30% LEFT RENAL ARTERY STENOSIS  . Cystoscopy with biopsy  12/29/2011    Procedure: CYSTOSCOPY WITH BIOPSY;  Surgeon: Bernestine Amass, MD;  Location: [facility];  Service: Urology;  Laterality: N/A;  [place]     . Cystoscopy w/ retrogrades  04/19/2012    Procedure: CYSTOSCOPY WITH RETROGRADE PYELOGRAM;  Surgeon: Bernestine Amass, MD;  Location: [facility];  Service: Urology;  Laterality: Bilateral;  30 MINS Cysto, Biopsy, possible TURBT, Bilateral retrograde pyelograms with Mitomycin C instilation Post op   . Transurethral resection of bladder tumor  04/19/2012    Procedure: TRANSURETHRAL RESECTION OF BLADDER TUMOR (TURBT);  Surgeon: Bernestine Amass, MD;  Location: [facility];  Service: Urology;  Laterality: N/A;     (Not in a hospital admission) No Known Allergies  Social History  Substance Use Topics  . Smoking status: Former Smoker -- 0.50 packs/day for 20 years    Types: Cigarettes    Quit date: 05/13/2007  . Smokeless tobacco: Never Used  . Alcohol Use: No    Family History  Problem Relation Age of Onset  . Coronary artery disease Father   . Colon cancer Neg Hx   . Stomach cancer Neg Hx   . Diabetes Maternal Grandmother   . Heart disease Paternal Grandfather      Review of Systems  HENT: Positive for hearing loss and tinnitus.   Respiratory: Positive for shortness of breath.   Cardiovascular: Positive for chest pain.  Gastrointestinal: Positive for diarrhea.  Genitourinary: Positive for dysuria, frequency and hematuria.  Musculoskeletal: Positive for joint pain.  Neurological: Positive for dizziness and headaches.  Endo/Heme/Allergies: Bruises/bleeds easily.  All other systems reviewed and are negative.   Objective:  Physical Exam  Constitutional: He is oriented to person, place, and time. He appears well-developed and well-nourished. No distress.  HENT:  Head: Normocephalic and atraumatic.  Nose: Nose normal.  Eyes: Conjunctivae and EOM are normal. Pupils are equal, round, and reactive to light.  Neck: Normal range of motion. Neck supple.  Cardiovascular: Normal rate, regular rhythm, normal heart sounds and intact distal pulses.    Respiratory: Effort normal and breath sounds normal. No respiratory distress. He has no wheezes.  GI: Soft. Bowel sounds are normal. He exhibits no distension. There is no tenderness.  Musculoskeletal:       Left knee: He exhibits swelling. He exhibits normal range of motion, no erythema, no LCL laxity and no MCL laxity. Tenderness found.  Lymphadenopathy:    He has no cervical adenopathy.  Neurological: He is alert and oriented to person, place, and time. No cranial nerve deficit.  Skin: Skin is warm and dry. No rash noted. No erythema.  Psychiatric: He has a normal mood and affect. His behavior is normal.    Vital signs in last 24 hours: @VSRANGES @  Labs:   Estimated body mass index is 26.14 kg/(m^2) as calculated from the following:   Height as of 04/26/15: 5\' 10"  (1.778 m).   Weight as of 04/26/15: 82.645 kg (182 lb 3.2 oz).   Imaging Review Plain radiographs demonstrate severe degenerative joint disease of the left knee(s). The overall alignment ismild varus. The bone quality appears to be good for age and reported activity level.  Assessment/Plan:  End stage arthritis, left knee   The  patient history, physical examination, clinical judgment of the provider and imaging studies are consistent with end stage degenerative joint disease of the left knee(s) and total knee arthroplasty is deemed medically necessary. The treatment options including medical management, injection therapy arthroscopy and arthroplasty were discussed at length. The risks and benefits of total knee arthroplasty were presented and reviewed. The risks due to aseptic loosening, infection, stiffness, patella tracking problems, thromboembolic complications and other imponderables were discussed. The patient acknowledged the explanation, agreed to proceed with the plan and consent was signed. Patient is being admitted for inpatient treatment for surgery, pain control, PT, OT, prophylactic antibiotics, VTE  prophylaxis, progressive ambulation and ADL's and discharge planning. The patient is planning to be discharged to skilled nursing facility [facility], pt lives alone.

## 2015-05-08 ENCOUNTER — Encounter (HOSPITAL_COMMUNITY)
Admission: RE | Admit: 2015-05-08 | Discharge: 2015-05-08 | Disposition: A | Discharge status: Home / Self Care | Payer: Medicare Other | Source: Ambulatory Visit | Attending: Physician Assistant | Admitting: Physician Assistant

## 2015-05-08 ENCOUNTER — Other Ambulatory Visit (HOSPITAL_COMMUNITY): Payer: Self-pay | Admitting: *Deleted

## 2015-05-08 ENCOUNTER — Encounter (HOSPITAL_COMMUNITY): Payer: Self-pay

## 2015-05-08 ENCOUNTER — Encounter (HOSPITAL_COMMUNITY)
Admission: RE | Admit: 2015-05-08 | Discharge: 2015-05-08 | Disposition: A | Discharge status: Home / Self Care | Payer: Medicare Other | Source: Ambulatory Visit | Attending: Orthopedic Surgery | Admitting: Orthopedic Surgery

## 2015-05-08 DIAGNOSIS — I10 Essential (primary) hypertension: Secondary | ICD-10-CM | POA: Insufficient documentation

## 2015-05-08 DIAGNOSIS — Z01812 Encounter for preprocedural laboratory examination: Secondary | ICD-10-CM | POA: Diagnosis not present

## 2015-05-08 DIAGNOSIS — E1142 Type 2 diabetes mellitus with diabetic polyneuropathy: Secondary | ICD-10-CM | POA: Insufficient documentation

## 2015-05-08 DIAGNOSIS — M1712 Unilateral primary osteoarthritis, left knee: Secondary | ICD-10-CM | POA: Insufficient documentation

## 2015-05-08 DIAGNOSIS — Z01818 Encounter for other preprocedural examination: Secondary | ICD-10-CM | POA: Insufficient documentation

## 2015-05-08 DIAGNOSIS — Z87891 Personal history of nicotine dependence: Secondary | ICD-10-CM | POA: Diagnosis not present

## 2015-05-08 DIAGNOSIS — I2510 Atherosclerotic heart disease of native coronary artery without angina pectoris: Secondary | ICD-10-CM | POA: Diagnosis not present

## 2015-05-08 DIAGNOSIS — Z79899 Other long term (current) drug therapy: Secondary | ICD-10-CM | POA: Diagnosis not present

## 2015-05-08 DIAGNOSIS — Z7984 Long term (current) use of oral hypoglycemic drugs: Secondary | ICD-10-CM | POA: Insufficient documentation

## 2015-05-08 DIAGNOSIS — G4733 Obstructive sleep apnea (adult) (pediatric): Secondary | ICD-10-CM | POA: Diagnosis not present

## 2015-05-08 DIAGNOSIS — Z7982 Long term (current) use of aspirin: Secondary | ICD-10-CM | POA: Diagnosis not present

## 2015-05-08 DIAGNOSIS — K219 Gastro-esophageal reflux disease without esophagitis: Secondary | ICD-10-CM | POA: Insufficient documentation

## 2015-05-08 DIAGNOSIS — Z0183 Encounter for blood typing: Secondary | ICD-10-CM | POA: Diagnosis not present

## 2015-05-08 DIAGNOSIS — Z8551 Personal history of malignant neoplasm of bladder: Secondary | ICD-10-CM | POA: Diagnosis not present

## 2015-05-08 DIAGNOSIS — E785 Hyperlipidemia, unspecified: Secondary | ICD-10-CM | POA: Insufficient documentation

## 2015-05-08 DIAGNOSIS — I252 Old myocardial infarction: Secondary | ICD-10-CM | POA: Diagnosis not present

## 2015-05-08 HISTORY — DX: Major depressive disorder, single episode, unspecified: F32.9

## 2015-05-08 HISTORY — DX: Depression, unspecified: F32.A

## 2015-05-08 HISTORY — DX: Unspecified osteoarthritis, unspecified site: M19.90

## 2015-05-08 LAB — CBC WITH DIFFERENTIAL/PLATELET
Basophils Absolute: 0 10*3/uL (ref 0.0–0.1)
Basophils Relative: 1 %
EOS ABS: 0.1 10*3/uL (ref 0.0–0.7)
Eosinophils Relative: 1 %
HCT: 49.8 % (ref 39.0–52.0)
HEMOGLOBIN: 17.2 g/dL — AB (ref 13.0–17.0)
LYMPHS ABS: 1.6 10*3/uL (ref 0.7–4.0)
Lymphocytes Relative: 25 %
MCH: 32.6 pg (ref 26.0–34.0)
MCHC: 34.5 g/dL (ref 30.0–36.0)
MCV: 94.3 fL (ref 78.0–100.0)
MONOS PCT: 12 %
Monocytes Absolute: 0.7 10*3/uL (ref 0.1–1.0)
NEUTROS PCT: 61 %
Neutro Abs: 3.8 10*3/uL (ref 1.7–7.7)
Platelets: 143 10*3/uL — ABNORMAL LOW (ref 150–400)
RBC: 5.28 MIL/uL (ref 4.22–5.81)
RDW: 12.7 % (ref 11.5–15.5)
WBC: 6.2 10*3/uL (ref 4.0–10.5)

## 2015-05-08 LAB — URINALYSIS, ROUTINE W REFLEX MICROSCOPIC
Bilirubin Urine: NEGATIVE
HGB URINE DIPSTICK: NEGATIVE
Ketones, ur: NEGATIVE mg/dL
Leukocytes, UA: NEGATIVE
Nitrite: NEGATIVE
Protein, ur: NEGATIVE mg/dL
SPECIFIC GRAVITY, URINE: 1.02 (ref 1.005–1.030)
pH: 5.5 (ref 5.0–8.0)

## 2015-05-08 LAB — COMPREHENSIVE METABOLIC PANEL
ALK PHOS: 55 U/L (ref 38–126)
ALT: 66 U/L — ABNORMAL HIGH (ref 17–63)
ANION GAP: 9 (ref 5–15)
AST: 50 U/L — ABNORMAL HIGH (ref 15–41)
Albumin: 4.2 g/dL (ref 3.5–5.0)
BILIRUBIN TOTAL: 1.1 mg/dL (ref 0.3–1.2)
BUN: 12 mg/dL (ref 6–20)
CALCIUM: 9.7 mg/dL (ref 8.9–10.3)
CO2: 27 mmol/L (ref 22–32)
Chloride: 102 mmol/L (ref 101–111)
Creatinine, Ser: 1.08 mg/dL (ref 0.61–1.24)
GFR calc non Af Amer: >60 mL/min (ref >60)
Glucose, Bld: 215 mg/dL — ABNORMAL HIGH (ref 65–99)
Potassium: 4.4 mmol/L (ref 3.5–5.1)
SODIUM: 138 mmol/L (ref 135–145)
TOTAL PROTEIN: 7.2 g/dL (ref 6.5–8.1)

## 2015-05-08 LAB — PROTIME-INR
INR: 1.13 (ref 0.00–1.49)
PROTHROMBIN TIME: 14.7 s (ref 11.6–15.2)

## 2015-05-08 LAB — TYPE AND SCREEN
ABO/RH(D): A POS
Antibody Screen: NEGATIVE

## 2015-05-08 LAB — SURGICAL PCR SCREEN
MRSA, PCR: NEGATIVE
Staphylococcus aureus: POSITIVE — AB

## 2015-05-08 LAB — URINE MICROSCOPIC-ADD ON: RBC / HPF: NONE SEEN RBC/hpf (ref 0–5)

## 2015-05-08 LAB — APTT: aPTT: 25 seconds (ref 24–37)

## 2015-05-08 LAB — GLUCOSE, CAPILLARY: GLUCOSE-CAPILLARY: 224 mg/dL — AB (ref 65–99)

## 2015-05-08 LAB — ABO/RH: ABO/RH(D): A POS

## 2015-05-08 NOTE — Pre-Procedure Instructions (Addendum)
Isaac [name]  05/08/2015      [facility] - [name], [place] [address] [place] [address] Phone: [phone] Fax: [phone]  [id number] [place], [name] [address] Phone: [phone] Fax: [phone]  Isaac [name], Isaac [name] [address] [place] [address] Phone: [phone] Fax: [phone], Isaac [name], Isaac [name] - [address] [name] [address] Phone: [phone] Fax: [phone]    Your procedure is scheduled on Friday, May 18, 2015   Report to [facility] Entrance "A" Admitting Office at 5:30 AM.  Call this number if you have problems the morning of surgery: [phone]  Any questions prior to day of surgery, please call [phone] between 8 & 4 PM.   Remember:  Do not eat food or drink liquids after midnight Thursday, 05/17/15.  Take these medicines the morning of surgery with A SIP OF WATER: Amlodipine (Norvasc), Isosorbide Mononitrate (Imdur), Pantoprazole (Protonix) valium,nitro if needed  STOP all herbel meds, nsaids (aleve,naproxen,advil,ibuprofen) 5 days prior to surgery starting 05/13/15 including aspirin, vitamins  How to Manage Your Diabetes Before Surgery   Why is it important to control my blood sugar before and after surgery?   Improving blood sugar levels before and after surgery helps healing and can limit problems.  A way of improving blood sugar control is eating a healthy diet by:  - Eating less sugar and carbohydrates  - Increasing activity/exercise  - Talk with your doctor about reaching your blood sugar goals  High blood sugars (greater than 180 mg/dL) can raise your risk of infections and slow down your recovery so you will need to focus on controlling your diabetes during the  weeks before surgery.  Make sure that the doctor who takes care of your diabetes knows about your planned surgery including the date and location.  How do I manage my blood sugars before surgery?   Check your blood sugar at least 4 times a day, 2 days before surgery to make sure that they are not too high or low.  Check your blood sugar the morning of your surgery when you wake up and every 2 hours until you get to the Short-Stay unit.  Treat a low blood sugar (less than 70 mg/dL) with 1/2 cup of clear juice (cranberry or apple), 4 glucose tablets, OR glucose gel.  Recheck blood sugar in 15 minutes after treatment (to make sure it is greater than 70 mg/dL).  If blood sugar is not greater than 70 mg/dL on re-check, call [phone] for further instructions.   Report your blood sugar to the Short-Stay nurse when you get to Short-Stay.  References:  [facility], 2007 "How to Manage your Diabetes Before and After Surgery".  What do I do about my diabetes medications?   Do not take oral diabetes medicines (pills) the morning of surgery.(metformin, invokana)    THE NIGHT BEFORE SURGERY, do not take your dinner time Glimepiride (Amaryl) dose    Do not wear jewelry.  Do not wear lotions, powders, or cologne.  You may wear deodorant.  Do not shave 48 hours prior to surgery.  Men may shave face and neck.  Do not bring valuables to the hospital.  [facility] is not responsible for  any belongings or valuables.  Contacts, dentures or bridgework may not be worn into surgery.  Leave your suitcase in the car.  After surgery it may be brought to your room.  For patients admitted to the hospital, discharge time will be determined by your treatment team.  Special instructions:  Special Instructions: [place] - Preparing for Surgery  Before surgery, you can play an important role.  Because skin is not sterile, your skin needs to be as free of germs as possible.   You can reduce the number of germs on you skin by washing with CHG (chlorahexidine gluconate) soap before surgery.  CHG is an antiseptic cleaner which kills germs and bonds with the skin to continue killing germs even after washing.  Please DO NOT use if you have an allergy to CHG or antibacterial soaps.  If your skin becomes reddened/irritated stop using the CHG and inform your nurse when you arrive at Short Stay.  Do not shave (including legs and underarms) for at least 48 hours prior to the first CHG shower.  You may shave your face.  Please follow these instructions carefully:   1.  Shower with CHG Soap the night before surgery and the morning of Surgery.  2.  If you choose to wash your hair, wash your hair first as usual with your normal shampoo.  3.  After you shampoo, rinse your hair and body thoroughly to remove the Shampoo.  4.  Use CHG as you would any other liquid soap.  You can apply chg directly  to the skin and wash gently with scrungie or a clean washcloth.  5.  Apply the CHG Soap to your body ONLY FROM THE NECK DOWN.  Do not use on open wounds or open sores.  Avoid contact with your eyes ears, mouth and genitals (private parts).  Wash genitals (private parts)       with your normal soap.  6.  Wash thoroughly, paying special attention to the area where your surgery will be performed.  7.  Thoroughly rinse your body with warm water from the neck down.  8.  DO NOT shower/wash with your normal soap after using and rinsing off the CHG Soap.  9.  Pat yourself dry with a clean towel.            10.  Wear clean pajamas.            11.  Place clean sheets on your bed the night of your first shower and do not sleep with pets.  Day of Surgery  Do not apply any lotions/deodorants the morning of surgery.  Please wear clean clothes to the hospital/surgery center.  Please read over the following fact sheets that you were given. Pain Booklet, Coughing and Deep Breathing, Blood Transfusion  Information, MRSA Information and Surgical Site Infection Prevention

## 2015-05-08 NOTE — Progress Notes (Signed)
[facility]up[facility] for positive PCR of Staph. Pt notified and voiced understanding.

## 2015-05-09 LAB — URINE CULTURE: CULTURE: NO GROWTH

## 2015-05-09 NOTE — Progress Notes (Signed)
Anesthesia Chart Review: Patient is a 59 year old male scheduled for left TKA on 05/18/15 by Dr. [name].  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 Risa Grill), depression, peripheral neuropathy RLE, UHR, rhinoplasty.   PCP is listed as Dr. Tedra Senegal.  Endocrinologist is Dr. Elayne Snare.  Saw GI Dr. [name] 01/2015 for elevated LFTs and found to have fatty liver by U/S.  Cardiologist is Dr. [name], last visit 02/15/15, who signed a note of medical/cardiac clearance on 02/27/15 (scanned under Media tab).   Meds include amlodipine, ASA 81 mg, Invokana, Valium, Dilaudid, Imdur, metformin, Toprol XL, Nitro, Protonix, Requip, Crestor.   02/15/15 EKG: NSR with sinus arrhythmia, LAD.  02/23/13 Nuclear stress test: Overall Impression: Low risk stress nuclear study Small inferior wall infarct at mid and basal level. LV Ejection Fraction: 57%. LV Wall Motion: NL LV Function; NL Wall Motion.  06/17/07 Cardiac cath: ASSESSMENT: 1. Mild nonobstructive coronary artery disease as described above. 2. TIMI-2 flow in most of the coronary beds likely secondary to  endothelial dysfunction or microvascular disease with a possible  component of vasospasm. 3. Normal left ventricular function. 4. Thirty-percent left renal artery stenosis. PLAN: [name] truly has evidence of endothelial dysfunction and perhaps microvascular disease and vasospasm. He will need aggressive risk factor management. I emphasized to him that stopping smoking was very, very important. He also needed to be treated with aspirin, nitroglycerin and possibly calcium channel blockers. He will follow up with Dr. Harrington Challenger.  05/08/15 CXR: IMPRESSION: Normal chest.  Preoperative labs  noted. AST, ALT mildly elevated at 50, 60 respectively. PLT 143K. Cr 1.08. PT/PTT WNL. Urine culture showed no growth. A1C on 04/23/15 was 8.5 (up from 7.0 on 12/20/14). A1C results routed to Dr. [name] and voice message left with Claiborne Billings at his office. He will need a fasting glucose on arrival.   He has been cleared by his cardiologist following evaluation within the past three months. If no acute changes and CBG results acceptable then I would anticipate that he could proceed as planned.  [name] Center/Anesthesiology Phone [phone] 05/09/2015 5:47 PM

## 2015-05-17 MED ORDER — CHLORHEXIDINE GLUCONATE 4 % EX LIQD: 60.0000 mL | Freq: Once | CUTANEOUS | Status: DC

## 2015-05-17 MED ORDER — SODIUM CHLORIDE 0.9 % IV SOLN: INTRAVENOUS | Status: DC

## 2015-05-17 MED ORDER — CEFAZOLIN SODIUM-DEXTROSE 2-3 GM-% IV SOLR
2.0000 g | INTRAVENOUS | Status: AC
  Administered 2015-05-18: 2 g via INTRAVENOUS
  Filled 2015-05-17: qty 50

## 2015-05-17 MED ORDER — TRANEXAMIC ACID 1000 MG/10ML IV SOLN
2000.0000 mg | INTRAVENOUS | Status: DC
  Filled 2015-05-17 (×2): qty 20

## 2015-05-17 NOTE — Anesthesia Preprocedure Evaluation (Addendum)
Anesthesia Evaluation  Patient identified by MRN, date of birth, ID band Patient awake    Reviewed: Allergy & Precautions, NPO status , Patient's Chart, lab work & pertinent test results, reviewed documented beta blocker date and time   History of Anesthesia Complications Negative for: history of anesthetic complications  Airway Mallampati: II  TM Distance: >3 FB Neck ROM: Full    Dental  (+) Dental Advisory Given, Teeth Intact   Pulmonary sleep apnea and Continuous Positive Airway Pressure Ventilation , former smoker (quit '09),    breath sounds clear to auscultation       Cardiovascular hypertension, Pt. on medications and Pt. on home beta blockers (-) angina+ CAD (mild, non-obstructive by cath) and + Past MI (vasospastic)   Rhythm:Regular Rate:Normal  '14 ECHO stress: EF 57%, small inf scar   Neuro/Psych Depression    GI/Hepatic Neg liver ROS, GERD  Controlled and Medicated,  Endo/Other  diabetes (glu 124), Type 2, Oral Hypoglycemic Agents  Renal/GU negative Renal ROS     Musculoskeletal   Abdominal   Peds  Hematology negative hematology ROS (+)   Anesthesia Other Findings   Reproductive/Obstetrics                       Anesthesia Physical Anesthesia Plan  ASA: III  Anesthesia Plan: Spinal   Post-op Pain Management: MAC Combined w/ Regional for Post-op pain   Induction:   Airway Management Planned: Natural Airway and Simple Face Mask  Additional Equipment:   Intra-op Plan:   Post-operative Plan:   Informed Consent: I have reviewed the patients History and Physical, chart, labs and discussed the procedure including the risks, benefits and alternatives for the proposed anesthesia with the patient or authorized representative who has indicated his/her understanding and acceptance.   Dental advisory given  Plan Discussed with: CRNA and Surgeon  Anesthesia Plan Comments: (Plan  routine monitors, SAB, adductor canal block for post op analgesia)        Anesthesia Quick Evaluation

## 2015-05-17 NOTE — Anesthesia Preprocedure Evaluation (Deleted)
Anesthesia Evaluation    Airway        Dental   Pulmonary           Cardiovascular      Neuro/Psych    GI/Hepatic   Endo/Other    Renal/GU      Musculoskeletal   Abdominal   Peds  Hematology   Anesthesia Other Findings   Reproductive/Obstetrics                             Anesthesia Physical  Anesthesia Plan  ASA: III  Anesthesia Plan: Spinal and Regional   Post-op Pain Management: MAC Combined w/ Regional for Post-op pain   Induction: Intravenous  Airway Management Planned:   Additional Equipment:   Intra-op Plan:   Post-operative Plan: Extubation in OR  Informed Consent: I have reviewed the patients History and Physical, chart, labs and discussed the procedure including the risks, benefits and alternatives for the proposed anesthesia with the patient or authorized representative who has indicated his/her understanding and acceptance.   Dental advisory given  Plan Discussed with: CRNA and Surgeon  Anesthesia Plan Comments:         Anesthesia Quick Evaluation

## 2015-05-18 ENCOUNTER — Inpatient Hospital Stay (HOSPITAL_COMMUNITY)
Admission: RE | Admit: 2015-05-18 | Discharge: 2015-05-21 | DRG: 469 | Disposition: A | Discharge status: Skilled Nursing Facility | Payer: Medicare Other | Source: Ambulatory Visit | Attending: Orthopedic Surgery | Admitting: Orthopedic Surgery

## 2015-05-18 ENCOUNTER — Inpatient Hospital Stay (HOSPITAL_COMMUNITY): Payer: Medicare Other | Admitting: Anesthesiology

## 2015-05-18 ENCOUNTER — Encounter (HOSPITAL_COMMUNITY)
Admission: RE | Disposition: A | Discharge status: Skilled Nursing Facility | Payer: Self-pay | Source: Ambulatory Visit | Attending: Orthopedic Surgery

## 2015-05-18 ENCOUNTER — Encounter (HOSPITAL_COMMUNITY): Payer: Self-pay | Admitting: *Deleted

## 2015-05-18 ENCOUNTER — Inpatient Hospital Stay (HOSPITAL_COMMUNITY): Payer: Medicare Other | Admitting: Vascular Surgery

## 2015-05-18 DIAGNOSIS — M1712 Unilateral primary osteoarthritis, left knee: Principal | ICD-10-CM | POA: Diagnosis present

## 2015-05-18 DIAGNOSIS — E1310 Other specified diabetes mellitus with ketoacidosis without coma: Secondary | ICD-10-CM | POA: Diagnosis present

## 2015-05-18 DIAGNOSIS — E1142 Type 2 diabetes mellitus with diabetic polyneuropathy: Secondary | ICD-10-CM | POA: Diagnosis present

## 2015-05-18 DIAGNOSIS — I10 Essential (primary) hypertension: Secondary | ICD-10-CM | POA: Diagnosis present

## 2015-05-18 DIAGNOSIS — F329 Major depressive disorder, single episode, unspecified: Secondary | ICD-10-CM | POA: Diagnosis present

## 2015-05-18 DIAGNOSIS — Z8551 Personal history of malignant neoplasm of bladder: Secondary | ICD-10-CM

## 2015-05-18 DIAGNOSIS — I252 Old myocardial infarction: Secondary | ICD-10-CM

## 2015-05-18 DIAGNOSIS — M25562 Pain in left knee: Secondary | ICD-10-CM | POA: Diagnosis present

## 2015-05-18 DIAGNOSIS — F419 Anxiety disorder, unspecified: Secondary | ICD-10-CM | POA: Diagnosis present

## 2015-05-18 DIAGNOSIS — E782 Mixed hyperlipidemia: Secondary | ICD-10-CM | POA: Diagnosis present

## 2015-05-18 DIAGNOSIS — G4733 Obstructive sleep apnea (adult) (pediatric): Secondary | ICD-10-CM | POA: Diagnosis present

## 2015-05-18 DIAGNOSIS — E1110 Type 2 diabetes mellitus with ketoacidosis without coma: Secondary | ICD-10-CM | POA: Diagnosis present

## 2015-05-18 DIAGNOSIS — I2510 Atherosclerotic heart disease of native coronary artery without angina pectoris: Secondary | ICD-10-CM | POA: Diagnosis present

## 2015-05-18 DIAGNOSIS — F32A Depression, unspecified: Secondary | ICD-10-CM | POA: Diagnosis present

## 2015-05-18 DIAGNOSIS — Z87891 Personal history of nicotine dependence: Secondary | ICD-10-CM | POA: Diagnosis not present

## 2015-05-18 DIAGNOSIS — C669 Malignant neoplasm of unspecified ureter: Secondary | ICD-10-CM | POA: Diagnosis present

## 2015-05-18 DIAGNOSIS — K219 Gastro-esophageal reflux disease without esophagitis: Secondary | ICD-10-CM | POA: Diagnosis present

## 2015-05-18 HISTORY — PX: TOTAL KNEE ARTHROPLASTY: SHX125

## 2015-05-18 LAB — GLUCOSE, CAPILLARY
GLUCOSE-CAPILLARY: 116 mg/dL — AB (ref 65–99)
Glucose-Capillary: 124 mg/dL — ABNORMAL HIGH (ref 65–99)
Glucose-Capillary: 111 mg/dL — ABNORMAL HIGH (ref 65–99)
Glucose-Capillary: 98 mg/dL (ref 65–99)
Glucose-Capillary: 222 mg/dL — ABNORMAL HIGH (ref 65–99)

## 2015-05-18 SURGERY — ARTHROPLASTY, KNEE, TOTAL
Anesthesia: Spinal | Site: Knee | Laterality: Left

## 2015-05-18 MED ORDER — HYDROMORPHONE HCL 2 MG PO TABS
ORAL_TABLET | ORAL | Status: AC
  Filled 2015-05-18: qty 1

## 2015-05-18 MED ORDER — ISOSORBIDE MONONITRATE ER 60 MG PO TB24
120.0000 mg | ORAL_TABLET | Freq: Every morning | ORAL | Status: DC
Start: 2015-05-19 — End: 2015-05-21
  Administered 2015-05-19 – 2015-05-21 (×3): 120 mg via ORAL
  Filled 2015-05-18 (×3): qty 2

## 2015-05-18 MED ORDER — DIAZEPAM 5 MG PO TABS
ORAL_TABLET | ORAL | Status: AC
  Filled 2015-05-18: qty 2

## 2015-05-18 MED ORDER — HYDROMORPHONE HCL 1 MG/ML IJ SOLN
0.2500 mg | INTRAMUSCULAR | Status: DC | PRN
Start: 2015-05-18 — End: 2015-05-18
  Administered 2015-05-18 (×4): 0.5 mg via INTRAVENOUS

## 2015-05-18 MED ORDER — SODIUM CHLORIDE 0.9 % IV SOLN
2000.0000 mg | INTRAVENOUS | Status: DC | PRN
  Administered 2015-05-18: 2000 mg via INTRAVENOUS

## 2015-05-18 MED ORDER — DIAZEPAM 5 MG PO TABS
10.0000 mg | ORAL_TABLET | Freq: Four times a day (QID) | ORAL | Status: DC | PRN
  Administered 2015-05-18: 10 mg via ORAL

## 2015-05-18 MED ORDER — METOCLOPRAMIDE HCL 5 MG PO TABS: 5.0000 mg | ORAL_TABLET | Freq: Three times a day (TID) | ORAL | Status: DC | PRN

## 2015-05-18 MED ORDER — PANTOPRAZOLE SODIUM 40 MG PO TBEC
40.0000 mg | DELAYED_RELEASE_TABLET | Freq: Every morning | ORAL | Status: DC
  Administered 2015-05-19 – 2015-05-21 (×3): 40 mg via ORAL
  Filled 2015-05-18 (×3): qty 1

## 2015-05-18 MED ORDER — ACETAMINOPHEN 650 MG RE SUPP: 650.0000 mg | Freq: Four times a day (QID) | RECTAL | Status: DC | PRN

## 2015-05-18 MED ORDER — HYDROMORPHONE HCL 1 MG/ML IJ SOLN
1.0000 mg | INTRAMUSCULAR | Status: DC | PRN
  Administered 2015-05-18: 1 mg via INTRAVENOUS
  Administered 2015-05-18: 2 mg via INTRAVENOUS
  Administered 2015-05-19 – 2015-05-20 (×6): 1 mg via INTRAVENOUS
  Filled 2015-05-18 (×12): qty 1

## 2015-05-18 MED ORDER — FENTANYL CITRATE (PF) 100 MCG/2ML IJ SOLN
INTRAMUSCULAR | Status: DC | PRN
  Administered 2015-05-18: 100 ug via INTRAVENOUS
  Administered 2015-05-18: 25 ug via INTRAVENOUS
  Administered 2015-05-18 (×2): 50 ug via INTRAVENOUS
  Administered 2015-05-18: 25 ug via INTRAVENOUS

## 2015-05-18 MED ORDER — SODIUM CHLORIDE 0.9 % IV SOLN
INTRAVENOUS | Status: DC
  Administered 2015-05-18: 75 mL/h via INTRAVENOUS

## 2015-05-18 MED ORDER — ROSUVASTATIN CALCIUM 10 MG PO TABS
10.0000 mg | ORAL_TABLET | Freq: Every day | ORAL | Status: DC
  Administered 2015-05-19 – 2015-05-21 (×3): 10 mg via ORAL
  Filled 2015-05-18 (×3): qty 1

## 2015-05-18 MED ORDER — ROPINIROLE HCL 1 MG PO TABS
1.5000 mg | ORAL_TABLET | Freq: Every day | ORAL | Status: DC
  Administered 2015-05-18 – 2015-05-20 (×3): 1.5 mg via ORAL
  Filled 2015-05-18 (×3): qty 1

## 2015-05-18 MED ORDER — PROPOFOL 10 MG/ML IV BOLUS
INTRAVENOUS | Status: AC
  Filled 2015-05-18: qty 20

## 2015-05-18 MED ORDER — MIDAZOLAM HCL 2 MG/2ML IJ SOLN
INTRAMUSCULAR | Status: AC
  Filled 2015-05-18: qty 2

## 2015-05-18 MED ORDER — POLYETHYLENE GLYCOL 3350 17 G PO PACK: 17.0000 g | PACK | Freq: Every day | ORAL | Status: DC | PRN

## 2015-05-18 MED ORDER — ONDANSETRON HCL 4 MG/2ML IJ SOLN
4.0000 mg | Freq: Four times a day (QID) | INTRAMUSCULAR | Status: DC | PRN
  Administered 2015-05-18 – 2015-05-19 (×2): 4 mg via INTRAVENOUS
  Filled 2015-05-18 (×2): qty 2

## 2015-05-18 MED ORDER — INSULIN ASPART 100 UNIT/ML ~~LOC~~ SOLN
0.0000 [IU] | Freq: Three times a day (TID) | SUBCUTANEOUS | Status: DC
  Administered 2015-05-19 – 2015-05-20 (×3): 3 [IU] via SUBCUTANEOUS
  Administered 2015-05-21: 4 [IU] via SUBCUTANEOUS
  Administered 2015-05-21: 3 [IU] via SUBCUTANEOUS

## 2015-05-18 MED ORDER — GLYCOPYRROLATE 0.2 MG/ML IJ SOLN
INTRAMUSCULAR | Status: AC
  Filled 2015-05-18: qty 1

## 2015-05-18 MED ORDER — PHENOL 1.4 % MT LIQD: 1.0000 | OROMUCOSAL | Status: DC | PRN

## 2015-05-18 MED ORDER — METOPROLOL SUCCINATE ER 25 MG PO TB24
25.0000 mg | ORAL_TABLET | Freq: Every evening | ORAL | Status: DC
  Administered 2015-05-19: 25 mg via ORAL
  Filled 2015-05-18: qty 1

## 2015-05-18 MED ORDER — LACTATED RINGERS IV SOLN
INTRAVENOUS | Status: DC | PRN
  Administered 2015-05-18 (×2): via INTRAVENOUS

## 2015-05-18 MED ORDER — EPHEDRINE SULFATE 50 MG/ML IJ SOLN
INTRAMUSCULAR | Status: AC
  Filled 2015-05-18: qty 1

## 2015-05-18 MED ORDER — PROPOFOL 500 MG/50ML IV EMUL
INTRAVENOUS | Status: DC | PRN
  Administered 2015-05-18: 75 ug/kg/min via INTRAVENOUS
  Administered 2015-05-18: 08:00:00 via INTRAVENOUS

## 2015-05-18 MED ORDER — PHENYLEPHRINE HCL 10 MG/ML IJ SOLN
INTRAMUSCULAR | Status: DC | PRN
  Administered 2015-05-18: 80 ug via INTRAVENOUS
  Administered 2015-05-18 (×3): 40 ug via INTRAVENOUS

## 2015-05-18 MED ORDER — MIDAZOLAM HCL 5 MG/5ML IJ SOLN
INTRAMUSCULAR | Status: DC | PRN
  Administered 2015-05-18 (×2): 2 mg via INTRAVENOUS

## 2015-05-18 MED ORDER — PHENYLEPHRINE 40 MCG/ML (10ML) SYRINGE FOR IV PUSH (FOR BLOOD PRESSURE SUPPORT)
PREFILLED_SYRINGE | INTRAVENOUS | Status: AC
  Filled 2015-05-18: qty 10

## 2015-05-18 MED ORDER — ACETAMINOPHEN 10 MG/ML IV SOLN
INTRAVENOUS | Status: AC
  Filled 2015-05-18: qty 100

## 2015-05-18 MED ORDER — AMLODIPINE BESYLATE 5 MG PO TABS
5.0000 mg | ORAL_TABLET | Freq: Two times a day (BID) | ORAL | Status: DC
  Administered 2015-05-18 – 2015-05-21 (×6): 5 mg via ORAL
  Filled 2015-05-18 (×6): qty 1

## 2015-05-18 MED ORDER — FENTANYL CITRATE (PF) 250 MCG/5ML IJ SOLN
INTRAMUSCULAR | Status: AC
  Filled 2015-05-18: qty 5

## 2015-05-18 MED ORDER — MENTHOL 3 MG MT LOZG: 1.0000 | LOZENGE | OROMUCOSAL | Status: DC | PRN

## 2015-05-18 MED ORDER — ALUM & MAG HYDROXIDE-SIMETH 200-200-20 MG/5ML PO SUSP: 30.0000 mL | ORAL | Status: DC | PRN

## 2015-05-18 MED ORDER — APIXABAN 2.5 MG PO TABS
2.5000 mg | ORAL_TABLET | Freq: Two times a day (BID) | ORAL | Status: DC
  Administered 2015-05-19 – 2015-05-21 (×5): 2.5 mg via ORAL
  Filled 2015-05-18 (×5): qty 1

## 2015-05-18 MED ORDER — ONDANSETRON HCL 4 MG PO TABS
4.0000 mg | ORAL_TABLET | Freq: Four times a day (QID) | ORAL | Status: DC | PRN
  Administered 2015-05-20: 4 mg via ORAL

## 2015-05-18 MED ORDER — FLEET ENEMA 7-19 GM/118ML RE ENEM: 1.0000 | ENEMA | Freq: Once | RECTAL | Status: DC | PRN

## 2015-05-18 MED ORDER — SUCCINYLCHOLINE CHLORIDE 20 MG/ML IJ SOLN
INTRAMUSCULAR | Status: AC
  Filled 2015-05-18: qty 1

## 2015-05-18 MED ORDER — INSULIN ASPART 100 UNIT/ML ~~LOC~~ SOLN
6.0000 [IU] | Freq: Three times a day (TID) | SUBCUTANEOUS | Status: DC
  Administered 2015-05-19 – 2015-05-21 (×6): 6 [IU] via SUBCUTANEOUS

## 2015-05-18 MED ORDER — BUPIVACAINE-EPINEPHRINE (PF) 0.5% -1:200000 IJ SOLN
INTRAMUSCULAR | Status: AC
Start: 2015-05-18 — End: 2015-05-18
  Filled 2015-05-18: qty 30

## 2015-05-18 MED ORDER — BUPIVACAINE IN DEXTROSE 0.75-8.25 % IT SOLN
INTRATHECAL | Status: DC | PRN
  Administered 2015-05-18: 15 mg via INTRATHECAL

## 2015-05-18 MED ORDER — KETOROLAC TROMETHAMINE 30 MG/ML IJ SOLN
30.0000 mg | Freq: Once | INTRAMUSCULAR | Status: AC
  Administered 2015-05-18: 30 mg via INTRAVENOUS

## 2015-05-18 MED ORDER — BUPIVACAINE-EPINEPHRINE (PF) 0.5% -1:200000 IJ SOLN
INTRAMUSCULAR | Status: DC | PRN
  Administered 2015-05-18: 30 mL via PERINEURAL

## 2015-05-18 MED ORDER — SODIUM CHLORIDE 0.9 % IR SOLN
Status: DC | PRN
  Administered 2015-05-18 (×2): 1000 mL

## 2015-05-18 MED ORDER — BISACODYL 5 MG PO TBEC: 5.0000 mg | DELAYED_RELEASE_TABLET | Freq: Every day | ORAL | Status: DC | PRN

## 2015-05-18 MED ORDER — HYDROMORPHONE HCL 2 MG PO TABS
2.0000 mg | ORAL_TABLET | ORAL | Status: DC | PRN
  Administered 2015-05-18 – 2015-05-19 (×3): 2 mg via ORAL
  Filled 2015-05-18 (×3): qty 1

## 2015-05-18 MED ORDER — ONDANSETRON HCL 4 MG/2ML IJ SOLN
INTRAMUSCULAR | Status: DC | PRN
  Administered 2015-05-18: 4 mg via INTRAVENOUS

## 2015-05-18 MED ORDER — SODIUM CHLORIDE 0.9 % IJ SOLN
INTRAMUSCULAR | Status: AC
  Filled 2015-05-18: qty 10

## 2015-05-18 MED ORDER — ACETAMINOPHEN 10 MG/ML IV SOLN
1000.0000 mg | Freq: Four times a day (QID) | INTRAVENOUS | Status: AC
  Administered 2015-05-18 – 2015-05-19 (×4): 1000 mg via INTRAVENOUS
  Filled 2015-05-18 (×3): qty 100

## 2015-05-18 MED ORDER — ROCURONIUM BROMIDE 50 MG/5ML IV SOLN
INTRAVENOUS | Status: AC
  Filled 2015-05-18: qty 1

## 2015-05-18 MED ORDER — ONDANSETRON HCL 4 MG/2ML IJ SOLN
INTRAMUSCULAR | Status: AC
  Filled 2015-05-18: qty 2

## 2015-05-18 MED ORDER — METOCLOPRAMIDE HCL 5 MG/ML IJ SOLN
5.0000 mg | Freq: Three times a day (TID) | INTRAMUSCULAR | Status: DC | PRN
  Administered 2015-05-19 (×2): 10 mg via INTRAVENOUS
  Filled 2015-05-18 (×2): qty 2

## 2015-05-18 MED ORDER — MEPERIDINE HCL 25 MG/ML IJ SOLN: 6.2500 mg | INTRAMUSCULAR | Status: DC | PRN

## 2015-05-18 MED ORDER — APIXABAN 2.5 MG PO TABS: 2.5000 mg | ORAL_TABLET | Freq: Two times a day (BID) | ORAL | Status: DC

## 2015-05-18 MED ORDER — INSULIN ASPART 100 UNIT/ML ~~LOC~~ SOLN
0.0000 [IU] | Freq: Every day | SUBCUTANEOUS | Status: DC
  Administered 2015-05-18: 2 [IU] via SUBCUTANEOUS
  Administered 2015-05-19: 5 [IU] via SUBCUTANEOUS

## 2015-05-18 MED ORDER — KETOROLAC TROMETHAMINE 30 MG/ML IJ SOLN
INTRAMUSCULAR | Status: AC
  Filled 2015-05-18: qty 1

## 2015-05-18 MED ORDER — LIDOCAINE HCL (CARDIAC) 20 MG/ML IV SOLN
INTRAVENOUS | Status: AC
  Filled 2015-05-18: qty 5

## 2015-05-18 MED ORDER — BUPIVACAINE-EPINEPHRINE 0.5% -1:200000 IJ SOLN: INTRAMUSCULAR | Status: DC | PRN

## 2015-05-18 MED ORDER — HYDROMORPHONE HCL 1 MG/ML IJ SOLN
INTRAMUSCULAR | Status: AC
  Administered 2015-05-18: 1 mg
  Filled 2015-05-18: qty 2

## 2015-05-18 MED ORDER — NITROGLYCERIN 0.4 MG SL SUBL: 0.4000 mg | SUBLINGUAL_TABLET | SUBLINGUAL | Status: DC | PRN

## 2015-05-18 MED ORDER — HYDROMORPHONE HCL 2 MG PO TABS: ORAL_TABLET | ORAL | Status: DC

## 2015-05-18 MED ORDER — PROMETHAZINE HCL 25 MG/ML IJ SOLN: 6.2500 mg | INTRAMUSCULAR | Status: DC | PRN

## 2015-05-18 MED ORDER — DOCUSATE SODIUM 100 MG PO CAPS
100.0000 mg | ORAL_CAPSULE | Freq: Two times a day (BID) | ORAL | Status: DC
  Administered 2015-05-18 – 2015-05-21 (×6): 100 mg via ORAL
  Filled 2015-05-18 (×6): qty 1

## 2015-05-18 MED ORDER — MIDAZOLAM HCL 2 MG/2ML IJ SOLN
0.5000 mg | Freq: Once | INTRAMUSCULAR | Status: AC | PRN
  Administered 2015-05-18: 2 mg via INTRAVENOUS

## 2015-05-18 MED ORDER — DIPHENHYDRAMINE HCL 12.5 MG/5ML PO ELIX: 12.5000 mg | ORAL_SOLUTION | ORAL | Status: DC | PRN

## 2015-05-18 MED ORDER — CEFAZOLIN SODIUM 1-5 GM-% IV SOLN
1.0000 g | Freq: Four times a day (QID) | INTRAVENOUS | Status: AC
  Administered 2015-05-18 (×2): 1 g via INTRAVENOUS
  Filled 2015-05-18 (×2): qty 50

## 2015-05-18 MED ORDER — ACETAMINOPHEN 325 MG PO TABS
650.0000 mg | ORAL_TABLET | Freq: Four times a day (QID) | ORAL | Status: DC | PRN
  Administered 2015-05-19: 650 mg via ORAL
  Filled 2015-05-18: qty 2

## 2015-05-18 SURGICAL SUPPLY — 68 items
BAG DECANTER FOR FLEXI CONT (MISCELLANEOUS) ×2 IMPLANT
BANDAGE ELASTIC 4 VELCRO ST LF (GAUZE/BANDAGES/DRESSINGS) ×2 IMPLANT
BANDAGE ELASTIC 6 VELCRO ST LF (GAUZE/BANDAGES/DRESSINGS) ×2 IMPLANT
BANDAGE ESMARK 6X9 LF (GAUZE/BANDAGES/DRESSINGS) ×1 IMPLANT
BLADE SAGITTAL 25.0X1.19X90 (BLADE) ×2 IMPLANT
BLADE SAW SAG 90X13X1.27 (BLADE) ×2 IMPLANT
BNDG ESMARK 6X9 LF (GAUZE/BANDAGES/DRESSINGS) ×2
BONE CEMENT GENTAMICIN (Cement) ×4 IMPLANT
BOWL SMART MIX CTS (DISPOSABLE) ×2 IMPLANT
CAP KNEE TOTAL 3 SIGMA ×2 IMPLANT
CEMENT BONE GENTAMICIN 40 (Cement) ×2 IMPLANT
COVER SURGICAL LIGHT HANDLE (MISCELLANEOUS) ×2 IMPLANT
CUFF TOURNIQUET SINGLE 34IN LL (TOURNIQUET CUFF) ×2 IMPLANT
CUFF TOURNIQUET SINGLE 44IN (TOURNIQUET CUFF) IMPLANT
DRAPE INCISE IOBAN 66X45 STRL (DRAPES) ×2 IMPLANT
DRAPE ORTHO SPLIT 77X108 STRL (DRAPES) ×2
DRAPE SURG ORHT 6 SPLT 77X108 (DRAPES) ×2 IMPLANT
DRAPE U-SHAPE 47X51 STRL (DRAPES) ×2 IMPLANT
DRSG ADAPTIC 3X8 NADH LF (GAUZE/BANDAGES/DRESSINGS) ×2 IMPLANT
DRSG PAD ABDOMINAL 8X10 ST (GAUZE/BANDAGES/DRESSINGS) ×4 IMPLANT
DURAPREP 26ML APPLICATOR (WOUND CARE) ×2 IMPLANT
ELECT CAUTERY BLADE 6.4 (BLADE) ×2 IMPLANT
ELECT REM PT RETURN 9FT ADLT (ELECTROSURGICAL) ×2
ELECTRODE REM PT RTRN 9FT ADLT (ELECTROSURGICAL) ×1 IMPLANT
EVACUATOR 1/8 PVC DRAIN (DRAIN) ×2 IMPLANT
FACESHIELD STD STERILE (MASK) ×2 IMPLANT
FACESHIELD WRAPAROUND (MASK) ×4 IMPLANT
FLOSEAL 10ML (HEMOSTASIS) IMPLANT
GAUZE SPONGE 4X4 12PLY STRL (GAUZE/BANDAGES/DRESSINGS) ×2 IMPLANT
GLOVE BIOGEL PI IND STRL 8 (GLOVE) ×4 IMPLANT
GLOVE BIOGEL PI INDICATOR 8 (GLOVE) ×4
GLOVE ORTHO TXT STRL SZ7.5 (GLOVE) ×2 IMPLANT
GLOVE SURG ORTHO 8.0 STRL STRW (GLOVE) ×2 IMPLANT
GOWN STRL REUS W/ TWL LRG LVL3 (GOWN DISPOSABLE) ×2 IMPLANT
GOWN STRL REUS W/ TWL XL LVL3 (GOWN DISPOSABLE) ×1 IMPLANT
GOWN STRL REUS W/TWL 2XL LVL3 (GOWN DISPOSABLE) ×2 IMPLANT
GOWN STRL REUS W/TWL LRG LVL3 (GOWN DISPOSABLE) ×2
GOWN STRL REUS W/TWL XL LVL3 (GOWN DISPOSABLE) ×1
HANDPIECE INTERPULSE COAX TIP (DISPOSABLE) ×1
HOOD PEEL AWAY FACE SHEILD DIS (HOOD) ×2 IMPLANT
IMMOBILIZER KNEE 22 UNIV (SOFTGOODS) IMPLANT
KIT BASIN OR (CUSTOM PROCEDURE TRAY) ×2 IMPLANT
KIT ROOM TURNOVER OR (KITS) ×2 IMPLANT
MANIFOLD NEPTUNE II (INSTRUMENTS) ×2 IMPLANT
NEEDLE 22X1 1/2 (OR ONLY) (NEEDLE) IMPLANT
NS IRRIG 1000ML POUR BTL (IV SOLUTION) ×2 IMPLANT
PACK TOTAL JOINT (CUSTOM PROCEDURE TRAY) ×2 IMPLANT
PACK UNIVERSAL I (CUSTOM PROCEDURE TRAY) ×2 IMPLANT
PAD ABD 8X10 STRL (GAUZE/BANDAGES/DRESSINGS) ×4 IMPLANT
PAD ARMBOARD 7.5X6 YLW CONV (MISCELLANEOUS) ×4 IMPLANT
PAD CAST 4YDX4 CTTN HI CHSV (CAST SUPPLIES) ×1 IMPLANT
PADDING CAST COTTON 4X4 STRL (CAST SUPPLIES) ×1
PADDING CAST COTTON 6X4 STRL (CAST SUPPLIES) ×2 IMPLANT
SET HNDPC FAN SPRY TIP SCT (DISPOSABLE) ×1 IMPLANT
SPONGE GAUZE 4X4 12PLY STER LF (GAUZE/BANDAGES/DRESSINGS) ×2 IMPLANT
STAPLER VISISTAT 35W (STAPLE) ×2 IMPLANT
STRYKER SAGITTAL BLADE DUAL CUT ×2 IMPLANT
SUCTION FRAZIER TIP 10 FR DISP (SUCTIONS) ×2 IMPLANT
SUT ETHIBOND NAB CT1 #1 30IN (SUTURE) ×6 IMPLANT
SUT VIC AB 0 CT1 27 (SUTURE) ×1
SUT VIC AB 0 CT1 27XBRD ANBCTR (SUTURE) ×1 IMPLANT
SUT VIC AB 2-0 CT1 27 (SUTURE) ×2
SUT VIC AB 2-0 CT1 TAPERPNT 27 (SUTURE) ×2 IMPLANT
SYR CONTROL 10ML LL (SYRINGE) IMPLANT
TOWEL OR 17X24 6PK STRL BLUE (TOWEL DISPOSABLE) ×2 IMPLANT
TOWEL OR 17X26 10 PK STRL BLUE (TOWEL DISPOSABLE) ×2 IMPLANT
TRAY FOLEY CATH 16FRSI W/METER (SET/KITS/TRAYS/PACK) IMPLANT
WATER STERILE IRR 1000ML POUR (IV SOLUTION) ×2 IMPLANT

## 2015-05-18 NOTE — Interval H&P Note (Signed)
History and Physical Interval Note:  05/18/2015 7:27 AM  Isaac [name]  has presented today for surgery, with the diagnosis of OA LEFT KNEE  The various methods of treatment have been discussed with the patient and family. After consideration of risks, benefits and other options for treatment, the patient has consented to  Procedure(s): TOTAL KNEE ARTHROPLASTY (Left) as a surgical intervention .  The patient's history has been reviewed, patient examined, no change in status, stable for surgery.  I have reviewed the patient's chart and labs.  Questions were answered to the patient's satisfaction.     Fartun Paradiso JR,W D

## 2015-05-18 NOTE — Anesthesia Procedure Notes (Addendum)
Anesthesia Regional Block:  Adductor canal block  Pre-Anesthetic Checklist: ,, timeout performed, Correct Patient, Correct Site, Correct Laterality, Correct Procedure, Correct Position, site marked, Risks and benefits discussed,  Surgical consent,  Pre-op evaluation,  At surgeon's request and post-op pain management  Laterality: Left and Lower  Prep: chloraprep       Needles:  Injection technique: Single-shot  Needle Type: Echogenic Stimulator Needle     Needle Length: 9cm 9 cm Needle Gauge: 22 and 22 G    Additional Needles:  Procedures: ultrasound guided (picture in chart) Adductor canal block Narrative:  Start time: 05/18/2015 7:15 AM End time: 05/18/2015 7:24 AM Injection made incrementally with aspirations every 5 mL.  Performed by: Personally  Anesthesiologist: Glennon Mac, [place]  Additional Notes: Pt identified in Holding room.  Monitors applied. Working IV access confirmed. Sterile prep, drape L thigh.  #22ga ECHOgenic needle into adductor canal with US guidance.  30cc 0.5% Bupivacaine with 1:200k epi injected incrementally after negative test dose.  Patient asymptomatic, VSS, no heme aspirated, tolerated well.  Jenita Seashore, MD   Spinal Patient location during procedure: OR Start time: 05/18/2015 7:37 AM End time: 05/18/2015 7:41 AM Staffing Anesthesiologist: Annye Asa Performed by: anesthesiologist  Preanesthetic Checklist Completed: patient identified, site marked, surgical consent, pre-op evaluation, timeout performed, IV checked, risks and benefits discussed and monitors and equipment checked Spinal Block Patient position: sitting Prep: Betadine and ChloraPrep Patient monitoring: heart rate, cardiac monitor, continuous pulse ox and blood pressure Approach: midline Location: L3-4 Injection technique: single-shot Needle Needle type: Quincke  Needle gauge: 25 G Needle length: 9 cm Additional Notes Pt identified in Operating room.  Monitors applied. Working  IV access confirmed. Sterile prep, drape lumbar spine.  #25ga Quincke into clear CSF 3rd pass. 15mg  Bupivacaine with dextrose injected with asp CSF beginning/end of injection. Patient asymptomatic, VSS, no heme aspirated, tolerated well.  Jenita Seashore, MD  Procedure Name: [place] Date/Time: 05/18/2015 7:38 AM Performed by: Jenne Campus Pre-anesthesia Checklist: Patient identified, Emergency Drugs available, Suction available, Patient being monitored and Timeout performed Patient Re-evaluated:Patient Re-evaluated prior to inductionOxygen Delivery Method: Simple face mask

## 2015-05-18 NOTE — Evaluation (Signed)
Physical Therapy Evaluation Patient Details Name: LAMOUR FULLBRIGHT MRN: [id number] DOB: 09/02/1955 Today's Date: 05/18/2015   History of Present Illness  60 y.o. male s/p left total knee arthroplasty. Hx of DM II, anxiety, bladder cancer, and HTN.  Clinical Impression  Pt is s/p left TKA resulting in the deficits listed below (see PT Problem List). Demonstrates fair knee stability while taking several steps post op day #0. Min assist for transfer and walker control. Educated on positioning and knee exercises. Pt will benefit from skilled PT to increase their independence and safety with mobility to allow discharge to the venue listed below.       Follow Up Recommendations SNF (Pt reports he lives alone with no support) - will need to reach Mod I level with mobility to safely D/c home    Equipment Recommendations  Rolling walker with 5" wheels    Recommendations for Other Services       Precautions / Restrictions Precautions Precautions: Knee Precaution Booklet Issued: Yes (comment) Precaution Comments: reviewed handout Restrictions Weight Bearing Restrictions: Yes LLE Weight Bearing: Weight bearing as tolerated      Mobility  Bed Mobility Overal bed mobility: Needs Assistance Bed Mobility: Supine to Sit     Supine to sit: Supervision     General bed mobility comments: supervision for safety. Performed without physical assist. cues to use RLE to support LLE as needed  Transfers Overall transfer level: Needs assistance Equipment used: Rolling walker (2 wheeled) Transfers: Sit to/from [facility] Sit to Stand: Min assist Stand pivot transfers: Min assist       General transfer comment: Min assist for boost to stand. VC for hand placement. Min assist for walker control with pivot to recliner. Tactile cues for Lt knee extension while weight-bearing for quad activation.  Ambulation/Gait Ambulation/Gait assistance: Min assist Ambulation Distance (Feet):  4 Feet Assistive device: Rolling walker (2 wheeled) Gait Pattern/deviations: Decreased stride length;Step-to pattern;Decreased step length - right;Decreased stance time - left;Antalgic;Trunk flexed Gait velocity: decreased Gait velocity interpretation: Below normal speed for age/gender General Gait Details: Educated on safe DME use with a rolling walker. Tolerated several steps forward and backwards focusing on Lt knee extension for quad activation. No overt buckling noted, although using UE heavily for support. Reported mild dizziness but resolved after sitting.  Stairs            Wheelchair Mobility    Modified Rankin (Stroke Patients Only)       Balance Overall balance assessment: Needs assistance Sitting-balance support: No upper extremity supported;Feet supported Sitting balance-Leahy Scale: Normal     Standing balance support: Single extremity supported Standing balance-Leahy Scale: Poor                               Pertinent Vitals/Pain Pain Assessment: 0-10 Pain Score: 8  Pain Location: Lt knee Pain Descriptors / Indicators: Aching Pain Intervention(s): Monitored during session;Repositioned    Home Living Family/patient expects to be discharged to:: Skilled nursing facility Living Arrangements: Alone Available Help at Discharge:  (no assist available) Type of Home: House Home Access: Stairs to enter Entrance Stairs-Rails: None Entrance Stairs-Number of Steps: 1 Home Layout: One level Home Equipment: None      Prior Function Level of Independence: Independent               Hand Dominance        Extremity/Trunk Assessment   Upper Extremity Assessment: Defer to  OT evaluation           Lower Extremity Assessment: LLE deficits/detail   LLE Deficits / Details: decreased strength and ROM, able to lift against gravity with  approx 10 degrees extension lag     Communication   Communication: No difficulties  Cognition  Arousal/Alertness: Awake/alert Behavior During Therapy: WFL for tasks assessed/performed Overall Cognitive Status: Within Functional Limits for tasks assessed                      General Comments General comments (skin integrity, edema, etc.): reviewed positioning to promote optimal healing of Lt knee    Exercises Total Joint Exercises Ankle Circles/Pumps: AROM;Both;10 reps;Supine Quad Sets: Strengthening;Both;10 reps;Seated      Assessment/Plan    PT Assessment Patient needs continued PT services  PT Diagnosis Difficulty walking;Abnormality of gait;Acute pain   PT Problem List Decreased strength;Decreased range of motion;Decreased activity tolerance;Decreased balance;Decreased mobility;Decreased knowledge of use of DME;Decreased knowledge of precautions;Pain  PT Treatment Interventions DME instruction;Gait training;Functional mobility training;Therapeutic activities;Therapeutic exercise;Balance training;Neuromuscular re-education;Stair training;Patient/family education;Modalities   PT Goals (Current goals can be found in the Care Plan section) Acute Rehab PT Goals Patient Stated Goal: Go to rehab and get stronger before I have my other knee replaced. PT Goal Formulation: With patient Time For Goal Achievement: 05/25/15 Potential to Achieve Goals: Good    Frequency 7X/week   Barriers to discharge Decreased caregiver support lives alone    Co-evaluation               End of Session Equipment Utilized During Treatment: Gait belt Activity Tolerance: Patient tolerated treatment well Patient left: in chair;with call bell/phone within reach;with SCD's reapplied Nurse Communication: Mobility status         Time: [id number] PT Time Calculation (min) (ACUTE ONLY): 27 min   Charges:   PT Evaluation $PT Eval Moderate Complexity: 1 Procedure PT Treatments $Therapeutic Activity: 8-22 mins   PT G CodesEllouise Newer 05/18/2015, 7:04 PM Camille Bal  [place], [place]

## 2015-05-18 NOTE — Brief Op Note (Signed)
05/18/2015  9:45 AM  PATIENT:  Isaac [name]  60 y.o. male  PRE-OPERATIVE DIAGNOSIS:  OA LEFT KNEE  POST-OPERATIVE DIAGNOSIS:  OA LEFT KNEE  PROCEDURE:  Procedure(s): TOTAL KNEE ARTHROPLASTY (Left)  SURGEON:  Surgeon(s) and Role:    * Earlie Server, MD - Primary  PHYSICIAN ASSISTANT: Chriss Czar, PA-C  ASSISTANTS:    ANESTHESIA:   spinal and IV sedation  EBL:  Total I/O In: 1400 [I.V.:1400] Out: -   BLOOD ADMINISTERED:none  DRAINS: 1 hemovac drain left lateral knee self suction   LOCAL MEDICATIONS USED:  NONE  SPECIMEN:  No Specimen  DISPOSITION OF SPECIMEN:  N/A  COUNTS:  YES  TOURNIQUET:   Total Tourniquet Time Documented: Thigh (Left) - 62 minutes Total: Thigh (Left) - 62 minutes   DICTATION: .Other Dictation: Dictation Number unknown  PLAN OF CARE: Admit to inpatient   PATIENT DISPOSITION:  PACU - hemodynamically stable.   Delay start of Pharmacological VTE agent (>24hrs) due to surgical blood loss or risk of bleeding: yes

## 2015-05-18 NOTE — Transfer of Care (Signed)
Immediate Anesthesia Transfer of Care Note  Patient: Isaac [name]  Procedure(s) Performed: Procedure(s): TOTAL KNEE ARTHROPLASTY (Left)  Patient Location: PACU  Anesthesia Type:MAC and Spinal  Level of Consciousness: awake, alert , oriented and patient cooperative  Airway & Oxygen Therapy: Patient Spontanous Breathing and Patient connected to nasal cannula oxygen  Post-op Assessment: Report given to RN and Post -op Vital signs reviewed and stable  Post vital signs: Reviewed  Last Vitals:  Filed Vitals:   05/18/15 0608  BP: 126/84  Pulse: 73  Temp: 36.6 C  Resp: 20    Complications: No apparent anesthesia complications

## 2015-05-18 NOTE — Anesthesia Postprocedure Evaluation (Signed)
Anesthesia Post Note  Patient: Isaac [name]  Procedure(s) Performed: Procedure(s) (LRB): TOTAL KNEE ARTHROPLASTY (Left)  Patient location during evaluation: PACU Anesthesia Type: Spinal Level of consciousness: awake and alert, oriented and patient cooperative Pain management: pain level controlled Vital Signs Assessment: post-procedure vital signs reviewed and stable Respiratory status: spontaneous breathing, nonlabored ventilation, respiratory function stable and patient connected to nasal cannula oxygen Cardiovascular status: blood pressure returned to baseline and stable Postop Assessment: no signs of nausea or vomiting, no headache, no backache, spinal receding and patient able to bend at knees Anesthetic complications: no    Last Vitals:  Filed Vitals:   05/18/15 1300 05/18/15 1325  BP:    Pulse: 57 68  Temp:    Resp: 15 15    Last Pain:  Filed Vitals:   05/18/15 1329  PainSc: Asleep                 Jaquan Sadowsky,E. Jacai Kipp

## 2015-05-18 NOTE — Progress Notes (Signed)
Orthopedic Tech Progress Note Patient Details:  Isaac [name] 1955/11/29 [id number]  CPM Left Knee CPM Left Knee: On Left Knee Flexion (Degrees): 90 Left Knee Extension (Degrees): 0 Additional Comments: Trapeze bar    [name] 05/18/2015, 11:26 AM

## 2015-05-18 NOTE — Progress Notes (Signed)
Utilization review completed.  

## 2015-05-18 NOTE — Progress Notes (Signed)
Set up with [facility] for HHPT by MD office. CM to follow for d/c needs.

## 2015-05-18 NOTE — H&P (View-Only) (Signed)
TOTAL KNEE ADMISSION H&P  Patient is being admitted for left total knee arthroplasty.  Subjective:  Chief Complaint:left knee pain.  HPI: Isaac [name], 60 y.o. male, has a history of pain and functional disability in the left knee due to arthritis and has failed non-surgical conservative treatments for greater than 12 weeks to includeNSAID's and/or analgesics and activity modification.  Onset of symptoms was gradual, starting 5 years ago with gradually worsening course since that time. The patient noted no past surgery on the left knee(s).  Patient currently rates pain in the left knee(s) at 9 out of 10 with activity. Patient has night pain, worsening of pain with activity and weight bearing, pain that interferes with activities of daily living, pain with passive range of motion, crepitus and joint swelling.  Patient has evidence of periarticular osteophytes and joint space narrowing by imaging studies. There is no active infection.  Patient Active Problem List   Diagnosis Date Noted  . Personal history of colonic polyps 09/07/2013  . Type II or unspecified type diabetes mellitus without mention of complication, not stated as uncontrolled 06/19/2011  . Anxiety and depression 06/19/2011  . History of skin cancer 06/19/2011  . BLADDER CANCER , UNSPEC. 05/10/2008  . HYPERLIPIDEMIA-MIXED 05/10/2008  . CAD, NATIVE VESSEL 05/10/2008  . RESTLESS LEGS SYNDROME 11/17/2007  . Essential hypertension 11/16/2007  . OSA (obstructive sleep apnea) 11/16/2007   Past Medical History  Diagnosis Date  . Hypertension   . Dyslipidemia     pt unable to tolerate niaspan  . GERD (gastroesophageal reflux disease)   . Diabetes mellitus     followed by Dr. Dwyane Dee  . History of non-ST elevation myocardial infarction (NSTEMI) 2005  . OSA on CPAP   . Bladder cancer [facility]) followed by dr Risa Grill  . 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 History of angina CHRONIC --  CONTROLLED W/ IMDUR  . Peripheral neuropathy (HCC) RIGHT LEG NUMBNESS  . Coronary artery disease     followed by Dr. [name]; Lexiscan Myoview (10/14):  Low risk, EF 57%, small inf infarct  . Coronary vasospasm ([place]) PER DR ROSS NOTE 03-22-2012--  INTER. TIGHTNESS    PER PT PROBABLE FROM STRESS  . Bladder cancer [facility])     Past Surgical History  Procedure Laterality Date  . Umbillical hernia repair  2004  . Elbow surgery  07-07-2003    RIGHT ELBOW ARTHROSCOPY W/ OPEN RECONSTRUCTION  . Shoulder surgery  2005    RIGHT ROTATOR CUFF REPAIR  . Cystoscopy with biopsy  04/07/2011    Procedure: CYSTOSCOPY WITH BIOPSY/ RIGHT URETEROSCOPY;  Surgeon: Bernestine Amass, MD;  Location: [facility];  Service: Urology;  Laterality: N/A;    cystoscopy, cystogram, biopsy and fulgeration  . Cysto/ resection bladder tumor/ left retrograde pyelogram/ right ureteroscopy  08-07-2010    TCC OF BLADDER   S/P DISTAL URETERECTOMY/ REIMPLANTATION  . Cysto/ cystogram/ ureteroscopy  02-21-2009  . Right distal ureterectomy w/ reimplantation  10-30-2008    TCC OF RIGHT DISTAL URETER  . Cysto/ right ureteroscopy/ bx ureteral tumor  10-11-2008  . Cysto/ bladder bx  09-13-2008;  08-13-2007; 04-19-2007; 06-01-2006  . Cysto/ bilateral retrograde pyelogram/ right ureteroscopic laser fulguration ureteral tumor  02-16-2008  . Rhinoplasty w/ modified cartilage graft  05-17-2007    INTERNAL NASAL VALVE COLLAPSE/ OSA/ SEPTAL PERFERATION  . Transurethral resection of bladder tumor  01-13-2007  . Appendectomy  08-03-2004  . Knee arthroscopy w/  debridement  02-08-2004    RIGHT KNEE  . Cardiac catheterization  x3  last one 06-17-2007    MILD NON-OBSTRUCTIVE CAD/ NORMAL LVF/ 30% LEFT RENAL ARTERY STENOSIS  . Cystoscopy with biopsy  12/29/2011    Procedure: CYSTOSCOPY WITH BIOPSY;  Surgeon: Bernestine Amass, MD;  Location: [facility];  Service: Urology;  Laterality: N/A;  Pope     . Cystoscopy w/ retrogrades  04/19/2012    Procedure: CYSTOSCOPY WITH RETROGRADE PYELOGRAM;  Surgeon: Bernestine Amass, MD;  Location: [facility];  Service: Urology;  Laterality: Bilateral;  30 MINS Cysto, Biopsy, possible TURBT, Bilateral retrograde pyelograms with Mitomycin C instilation Post op   . Transurethral resection of bladder tumor  04/19/2012    Procedure: TRANSURETHRAL RESECTION OF BLADDER TUMOR (TURBT);  Surgeon: Bernestine Amass, MD;  Location: [facility];  Service: Urology;  Laterality: N/A;     (Not in a hospital admission) No Known Allergies  Social History  Substance Use Topics  . Smoking status: Former Smoker -- 0.50 packs/day for 20 years    Types: Cigarettes    Quit date: 05/13/2007  . Smokeless tobacco: Never Used  . Alcohol Use: No    Family History  Problem Relation Age of Onset  . Coronary artery disease Father   . Colon cancer Neg Hx   . Stomach cancer Neg Hx   . Diabetes Maternal Grandmother   . Heart disease Paternal Grandfather      Review of Systems  HENT: Positive for hearing loss and tinnitus.   Respiratory: Positive for shortness of breath.   Cardiovascular: Positive for chest pain.  Gastrointestinal: Positive for diarrhea.  Genitourinary: Positive for dysuria, frequency and hematuria.  Musculoskeletal: Positive for joint pain.  Neurological: Positive for dizziness and headaches.  Endo/Heme/Allergies: Bruises/bleeds easily.  All other systems reviewed and are negative.   Objective:  Physical Exam  Constitutional: He is oriented to person, place, and time. He appears well-developed and well-nourished. No distress.  HENT:  Head: Normocephalic and atraumatic.  Nose: Nose normal.  Eyes: Conjunctivae and EOM are normal. Pupils are equal, round, and reactive to light.  Neck: Normal range of motion. Neck supple.  Cardiovascular: Normal rate, regular rhythm, normal heart sounds and intact distal pulses.    Respiratory: Effort normal and breath sounds normal. No respiratory distress. He has no wheezes.  GI: Soft. Bowel sounds are normal. He exhibits no distension. There is no tenderness.  Musculoskeletal:       Left knee: He exhibits swelling. He exhibits normal range of motion, no erythema, no LCL laxity and no MCL laxity. Tenderness found.  Lymphadenopathy:    He has no cervical adenopathy.  Neurological: He is alert and oriented to person, place, and time. No cranial nerve deficit.  Skin: Skin is warm and dry. No rash noted. No erythema.  Psychiatric: He has a normal mood and affect. His behavior is normal.    Vital signs in last 24 hours: @VSRANGES @  Labs:   Estimated body mass index is 26.14 kg/(m^2) as calculated from the following:   Height as of 04/26/15: 5\' 10"  (1.778 m).   Weight as of 04/26/15: 82.645 kg (182 lb 3.2 oz).   Imaging Review Plain radiographs demonstrate severe degenerative joint disease of the left knee(s). The overall alignment ismild varus. The bone quality appears to be good for age and reported activity level.  Assessment/Plan:  End stage arthritis, left knee   The  patient history, physical examination, clinical judgment of the provider and imaging studies are consistent with end stage degenerative joint disease of the left knee(s) and total knee arthroplasty is deemed medically necessary. The treatment options including medical management, injection therapy arthroscopy and arthroplasty were discussed at length. The risks and benefits of total knee arthroplasty were presented and reviewed. The risks due to aseptic loosening, infection, stiffness, patella tracking problems, thromboembolic complications and other imponderables were discussed. The patient acknowledged the explanation, agreed to proceed with the plan and consent was signed. Patient is being admitted for inpatient treatment for surgery, pain control, PT, OT, prophylactic antibiotics, VTE  prophylaxis, progressive ambulation and ADL's and discharge planning. The patient is planning to be discharged to skilled nursing facility [facility], pt lives alone.

## 2015-05-18 NOTE — Op Note (Signed)
NAME:  Isaac [name], Isaac [name] NO.:  [id number]  MEDICAL RECORD NO.:  [id number]  LOCATION:  [facility]                         FACILITY:  [facility]  PHYSICIAN:  Lockie Pares, M.D.    DATE OF BIRTH:  03-Jul-1955  DATE OF PROCEDURE:  05/18/2015 DATE OF DISCHARGE:                              OPERATIVE REPORT   PREOPERATIVE DIAGNOSIS:  Osteoarthritis, left knee with varus deformity.  POSTOPERATIVE DIAGNOSIS:  Osteoarthritis, left knee with varus deformity.  OPERATION:  Left knee total knee replacement (Sigma cemented knee size 4 femur, tibia 15 mm bearing with 38 mm all poly patella).  SURGEON:  Lockie Pares, M.D.  ASSISTANT:  Chriss Czar, PA-C.  TOURNIQUET TIME:  60 minutes.  ANESTHESIA:  Spinal anesthetic with an adductor nerve block.  DESCRIPTION OF PROCEDURE:  After spinal anesthetic and adductor nerve block, supine positioning, application of a tourniquet, inflated to 350, straight skin incision with a medial parapatellar approach to the knee made.  We identified the distal femur, cut 11 mm of distal femur, a 5- degree valgus cut.  This was followed by cutting the tibia about 4 mm below the most diseased medial compartment, eventually settling on a 15 mm spacer block for the extension gap, then measured the femur to be a size 4 femur, with placement of the all in 1 cutting block with the appropriate degree of external rotation, accomplishing the anterior- posterior chamfer cuts.  Attention was next directed back at the tibia, where the keel was cut for the tibia with the tibial base plate trial placed.  We then cut the box for the femur, placed the trial femur with 15 mm bearing.  All parameters were checked, specifically full extension to slight hyperextension, good stability in extension, good balance in ligaments mediolaterally and in flexion.  There was no tendency for any instability.  The talar was cut, leaving about 17-18 mm of native patella.  All poly  trial placed again all parameters checked and deemed to be acceptable.  The trial components were removed and the final components were inserted with antibiotic impregnated cements, due to the patient's history of diabetes with a premixed 1 g of gentamicin per batch, for a total of 2 batches.  Cement was inserted in the doughy state, tibia followed by femur and patella.  We used a trial bearing though of the cement was allowed to harden.  Once the cement was hardened, we checked for any excess cement with the trial bearing removed.  None was noted.  The tourniquet was released with the trial bearing out of the knee.  No excessive bleeding was noted posteriorly. Small bleeders were coagulated.  Final bearing was placed.  Hemovac drain was placed in the superior lateral gutter and capsule was closed with #1 Ethibond with the subcu tissue with 2-0 Vicryl and skin with stapling device lightly compressive sterile dressing, knee immobilizer applied, taken to the recovery room in stable condition.     Lockie Pares, M.D.     WDC/MEDQ  D:  05/18/2015  T:  05/18/2015  Job:  [id number]

## 2015-05-18 NOTE — Progress Notes (Signed)
Pt complained of nausea and states he didn't need anything felt better put back to bed

## 2015-05-19 LAB — BASIC METABOLIC PANEL
Anion gap: 6 (ref 5–15)
BUN: 12 mg/dL (ref 6–20)
CALCIUM: 8.7 mg/dL — AB (ref 8.9–10.3)
CO2: 28 mmol/L (ref 22–32)
CREATININE: 1.05 mg/dL (ref 0.61–1.24)
Chloride: 104 mmol/L (ref 101–111)
GFR calc non Af Amer: >60 mL/min (ref >60)
Glucose, Bld: 129 mg/dL — ABNORMAL HIGH (ref 65–99)
Potassium: 4.1 mmol/L (ref 3.5–5.1)
SODIUM: 138 mmol/L (ref 135–145)

## 2015-05-19 LAB — CBC
HCT: 39.3 % (ref 39.0–52.0)
Hemoglobin: 12.9 g/dL — ABNORMAL LOW (ref 13.0–17.0)
MCH: 31.3 pg (ref 26.0–34.0)
MCHC: 32.8 g/dL (ref 30.0–36.0)
MCV: 95.4 fL (ref 78.0–100.0)
Platelets: 143 10*3/uL — ABNORMAL LOW (ref 150–400)
RBC: 4.12 MIL/uL — ABNORMAL LOW (ref 4.22–5.81)
RDW: 13.1 % (ref 11.5–15.5)
WBC: 6.3 10*3/uL (ref 4.0–10.5)

## 2015-05-19 LAB — GLUCOSE, CAPILLARY
GLUCOSE-CAPILLARY: 139 mg/dL — AB (ref 65–99)
GLUCOSE-CAPILLARY: 137 mg/dL — AB (ref 65–99)

## 2015-05-19 MED ORDER — DIAZEPAM 2 MG PO TABS
2.0000 mg | ORAL_TABLET | Freq: Four times a day (QID) | ORAL | Status: DC | PRN
  Administered 2015-05-19: 4 mg via ORAL
  Administered 2015-05-19: 2 mg via ORAL
  Administered 2015-05-19: 4 mg via ORAL
  Administered 2015-05-20: 2 mg via ORAL
  Administered 2015-05-20 – 2015-05-21 (×3): 4 mg via ORAL
  Filled 2015-05-19 (×5): qty 2
  Filled 2015-05-19: qty 1
  Filled 2015-05-19: qty 2
  Filled 2015-05-19: qty 1

## 2015-05-19 MED ORDER — HYDROMORPHONE HCL 2 MG PO TABS
2.0000 mg | ORAL_TABLET | ORAL | Status: DC | PRN
  Administered 2015-05-19: 2 mg via ORAL
  Administered 2015-05-19 – 2015-05-21 (×10): 4 mg via ORAL
  Filled 2015-05-19 (×10): qty 2

## 2015-05-19 MED ORDER — BISACODYL 5 MG PO TBEC
10.0000 mg | DELAYED_RELEASE_TABLET | Freq: Every day | ORAL | Status: DC
  Administered 2015-05-19 – 2015-05-20 (×2): 10 mg via ORAL
  Filled 2015-05-19 (×2): qty 2

## 2015-05-19 NOTE — Progress Notes (Signed)
Orthopedic Tech Progress Note Patient Details:  Isaac [name] June 04, 1955 [id number]  Ortho Devices Type of Ortho Device:  (footsie roll) Ortho Device/Splint Location: lle Ortho Device/Splint Interventions: Application   Mette Southgate 05/19/2015, 11:13 AM

## 2015-05-19 NOTE — Progress Notes (Signed)
Occupational Therapy Evaluation Patient Details Name: Isaac [name] MRN: [id number] DOB: [birth year] Today's Date: 05/19/2015    History of Present Illness 60 y.o. male s/p left total knee arthroplasty. Hx of DM II, anxiety, bladder cancer, and HTN.   Clinical Impression   Pt admitted with the above diagnoses and presents with below problem list. Pt will benefit from continued acute OT to address the below listed deficits and maximize independence with BADLs prior to d/c to venue below. PTA pt was independent with ADLs. Pt is currently min A for LB ADLs, transfers, and mobility. Pt reports he lives alone and has no assist at d/c. OT to continue to follow acutely.      Follow Up Recommendations  SNF;Supervision/Assistance - 24 hour    Equipment Recommendations  Other (comment) (likely will need 3n1)    Recommendations for Other Services       Precautions / Restrictions Precautions Precautions: Knee Precaution Comments: reviewed precautions Restrictions Weight Bearing Restrictions: Yes LLE Weight Bearing: Weight bearing as tolerated      Mobility Bed Mobility Overal bed mobility: Needs Assistance Bed Mobility: Supine to Sit     Supine to sit: Supervision     General bed mobility comments: supervision for safety. Performed without physical assist. cues to use RLE to support LLE as needed  Transfers Overall transfer level: Needs assistance Equipment used: Rolling walker (2 wheeled) Transfers: Sit to/from Stand Sit to Stand: Min assist         General transfer comment: light min steadying assist. Cues for technique    Balance Overall balance assessment: Needs assistance Sitting-balance support: No upper extremity supported;Feet supported Sitting balance-Leahy Scale: Normal     Standing balance support: Bilateral upper extremity supported;During functional activity Standing balance-Leahy Scale: Poor                              ADL Overall  ADL's : Needs assistance/impaired Eating/Feeding: Set up;Sitting   Grooming: Set up;Min guard;Sitting;Standing   Upper Body Bathing: Set up;Sitting   Lower Body Bathing: Moderate assistance;Sit to/from stand   Upper Body Dressing : Set up;Sitting   Lower Body Dressing: Minimal assistance;Sit to/from stand   Toilet Transfer: Minimal assistance;Ambulation;RW (3n1 over toilet)   Toileting- Clothing Manipulation and Hygiene: Set up;Sitting/lateral lean   Tub/ Shower Transfer: Minimal assistance;Ambulation;3 in 1;Rolling walker   Functional mobility during ADLs: Minimal assistance;Rolling walker General ADL Comments: Pt ambulated to/from bathroom with light min A at times for giat and transfer. Pt limited by 7/10 pain, premedicated. ADL education provided.      Vision     Perception     Praxis      Pertinent Vitals/Pain Pain Assessment: 0-10 Pain Score: 7  Pain Location: Lt knee Pain Descriptors / Indicators: Aching Pain Intervention(s): Monitored during session;Limited activity within patient's tolerance;Premedicated before session;Repositioned;Ice applied     Hand Dominance     Extremity/Trunk Assessment Upper Extremity Assessment Upper Extremity Assessment: Overall WFL for tasks assessed   Lower Extremity Assessment Lower Extremity Assessment: Defer to PT evaluation       Communication Communication Communication: No difficulties   Cognition Arousal/Alertness: Awake/alert Behavior During Therapy: WFL for tasks assessed/performed Overall Cognitive Status: Within Functional Limits for tasks assessed                     General Comments       Exercises       Shoulder Instructions  Home Living   Living Arrangements: Alone Available Help at Discharge: Other (Comment) (no assist available) Type of Home: House Home Access: Stairs to enter [employer] of Steps: 1 Entrance Stairs-Rails: None Home Layout: One level     Bathroom  Shower/Tub: Tub/shower unit         Home Equipment: None          Prior Functioning/Environment Level of Independence: Independent             OT Diagnosis: Acute pain   OT Problem List: Impaired balance (sitting and/or standing);Decreased knowledge of use of DME or AE;Decreased knowledge of precautions;Pain   OT Treatment/Interventions: Self-care/ADL training;DME and/or AE instruction;Therapeutic activities;Patient/family education;Balance training    OT Goals(Current goals can be found in the care plan section) Acute Rehab OT Goals Patient Stated Goal: Go to rehab and get stronger before I have my other knee replaced. OT Goal Formulation: With patient Time For Goal Achievement: 05/26/15 Potential to Achieve Goals: Good ADL Goals Pt Will Perform Grooming: with modified independence;standing Pt Will Perform Lower Body Bathing: with modified independence;sit to/from stand;with adaptive equipment Pt Will Perform Lower Body Dressing: with modified independence;with adaptive equipment;sit to/from stand Pt Will Transfer to Toilet: with modified independence;ambulating;bedside commode Pt Will Perform Toileting - Clothing Manipulation and hygiene: with modified independence;with adaptive equipment;sit to/from stand Pt Will Perform Tub/Shower Transfer: Tub transfer;with modified independence;ambulating;3 in 1;rolling walker  OT Frequency: Min 2X/week   Barriers to D/C: Decreased caregiver support          Co-evaluation              End of Session Equipment Utilized During Treatment: Gait belt;Rolling walker CPM Left Knee CPM Left Knee: Off  Activity Tolerance: Patient tolerated treatment well;Patient limited by pain Patient left: in chair;with call bell/phone within reach   Time: 0920-0945 OT Time Calculation (min): 25 min Charges:  OT General Charges $OT Visit: 1 Procedure OT Evaluation $OT Eval Low Complexity: 1 Procedure OT Treatments $Self Care/Home  Management : 8-22 mins G-Codes:    Hortencia Pilar 2015/06/11, 10:03 AM

## 2015-05-19 NOTE — Progress Notes (Signed)
Orthopedic Tech Progress Note Patient Details:  Isaac [name] [birth year] [id number] On cpm  At 1900 Patient ID: Isaac [name], male   DOB: Jun 20, 1955, 60 y.o.   MRN: [id number]   Isaac [name] 05/19/2015, 6:52 PM

## 2015-05-19 NOTE — Progress Notes (Signed)
Physical Therapy Treatment Patient Details Name: Isaac [name] MRN: [id number] DOB: 01-21-1956 Today's Date: 05/19/2015    History of Present Illness 60 y.o. male s/p left total knee arthroplasty. Hx of DM II, anxiety, bladder cancer, and HTN.    PT Comments    Pt is limited today by pain and fatigue, but was still able to ambulate to the hallway with RW and therapist's assist.  HEP handout given and exercises reviewed.  PT will continue to follow acutely.   Follow Up Recommendations  SNF     Equipment Recommendations  Rolling walker with 5" wheels;3in1 (PT)    Recommendations for Other Services  NA     Precautions / Restrictions Precautions Precautions: Knee Precaution Booklet Issued: Yes (comment) Precaution Comments: hanout given  Restrictions LLE Weight Bearing: Weight bearing as tolerated    Mobility  Bed Mobility               General bed mobility comments: Pt OOB in recliner chair  Transfers Overall transfer level: Needs assistance Equipment used: Rolling walker (2 wheeled) Transfers: Sit to/from Stand Sit to Stand: Min assist         General transfer comment: Min assist to help support trunk when going to stand for balance.  Verbal cues for safe hand placement.   Ambulation/Gait Ambulation/Gait assistance: Min assist;Min guard Ambulation Distance (Feet): 75 Feet Assistive device: Rolling walker (2 wheeled) Gait Pattern/deviations: Step-to pattern;Antalgic Gait velocity: decreased Gait velocity interpretation: Below normal speed for age/gender General Gait Details: Verbal cues for safe RW use, upright posture, and WBAT status.  Pt limited by pain and fatigue, but was able to get up to min guard assist by the end of gait for safety.           Balance Overall balance assessment: Needs assistance Sitting-balance support: Feet supported;No upper extremity supported Sitting balance-Leahy Scale: Normal     Standing balance support: Single  extremity supported;Bilateral upper extremity supported;No upper extremity supported Standing balance-Leahy Scale: Poor Standing balance comment: needs external support from therapist and RW                    Cognition Arousal/Alertness: Awake/alert Behavior During Therapy: WFL for tasks assessed/performed Overall Cognitive Status: Within Functional Limits for tasks assessed                      Exercises Total Joint Exercises Ankle Circles/Pumps: AROM;Both;20 reps Quad Sets: AROM;Left;10 reps Towel Squeeze: AROM;Both;10 reps Heel Slides: AAROM;Left;10 reps Goniometric ROM: 12-75        Pertinent Vitals/Pain Pain Assessment: 0-10 Pain Score: 9  Pain Location: left knee in knee extension foam Pain Descriptors / Indicators: Aching;Burning Pain Intervention(s): Limited activity within patient's tolerance;Monitored during session;Repositioned;RN gave pain meds during session;Patient requesting pain meds-RN notified;Ice applied     PT Goals (current goals can now be found in the care plan section) Acute Rehab PT Goals Patient Stated Goal: Go to rehab and get stronger before I have my other knee replaced. Progress towards PT goals: Progressing toward goals    Frequency  7X/week    PT Plan Current plan remains appropriate       End of Session   Activity Tolerance: Patient limited by pain Patient left: in chair;with call bell/phone within reach     Time: 1148-1229 PT Time Calculation (min) (ACUTE ONLY): 41 min  Charges:  $Gait Training: 23-37 mins $Therapeutic Exercise: 8-22 mins  Barbarann Ehlers [place], [place], DPT [phone]   05/19/2015, 2:19 PM

## 2015-05-19 NOTE — Progress Notes (Signed)
Subjective: 1 Day Post-Op Procedure(s) (LRB): TOTAL KNEE ARTHROPLASTY (Left) Patient reports pain as 8 on 0-10 scale.    Objective: Vital signs in last 24 hours: Temp:  [97.5 F (36.4 C)-98.8 F (37.1 C)] 98.8 F (37.1 C) (01/07 0511) Pulse Rate:  [36-89] 89 (01/07 0511) Resp:  [10-19] 17 (01/07 0511) BP: (105-119)/(71-86) 119/86 mmHg (01/07 0511) SpO2:  [90 %-100 %] 97 % (01/07 0511)  Intake/Output from previous day: 01/06 0701 - 01/07 0700 In: 2005 [P.O.:120; I.V.:1785; IV Piggyback:100] Out: [id number] [Urine:1000; Drains:535] Intake/Output this shift: Total I/O In: 360 [P.O.:360] Out: -    Recent Labs  05/19/15 0455  HGB 12.9*    Recent Labs  05/19/15 0455  WBC 6.3  RBC 4.12*  HCT 39.3  PLT 143*    Recent Labs  05/19/15 0455  NA 138  K 4.1  CL 104  CO2 28  BUN 12  CREATININE 1.05  GLUCOSE 129*  CALCIUM 8.7*   No results for input(s): LABPT, INR in the last 72 hours.  ABD soft Neurovascular intact Sensation intact distally Intact pulses distally Dorsiflexion/Plantar flexion intact Incision: dressing C/D/I  Assessment/Plan: 1 Day Post-Op Procedure(s) (LRB): TOTAL KNEE ARTHROPLASTY (Left)  Principal Problem:   Primary localized osteoarthritis of left knee Active Problems:   Essential hypertension   OSA (obstructive sleep apnea)   DM (diabetes mellitus) type 2, uncontrolled, with ketoacidosis ([place])   Anxiety and depression  Advance diet Up with therapy  Patient states he wants to go to SNF on Monday.   Will work on pain control and mobilization over the weekend  [facility] J 05/19/2015, 10:08 AM

## 2015-05-20 LAB — CBC
HCT: 39.8 % (ref 39.0–52.0)
Hemoglobin: 12.9 g/dL — ABNORMAL LOW (ref 13.0–17.0)
MCH: 31.3 pg (ref 26.0–34.0)
MCHC: 32.4 g/dL (ref 30.0–36.0)
MCV: 96.6 fL (ref 78.0–100.0)
PLATELETS: 158 10*3/uL (ref 150–400)
RBC: 4.12 MIL/uL — ABNORMAL LOW (ref 4.22–5.81)
RDW: 13 % (ref 11.5–15.5)
WBC: 7.4 10*3/uL (ref 4.0–10.5)

## 2015-05-20 LAB — GLUCOSE, CAPILLARY
GLUCOSE-CAPILLARY: 133 mg/dL — AB (ref 65–99)
Glucose-Capillary: 122 mg/dL — ABNORMAL HIGH (ref 65–99)
Glucose-Capillary: 135 mg/dL — ABNORMAL HIGH (ref 65–99)
Glucose-Capillary: 121 mg/dL — ABNORMAL HIGH (ref 65–99)

## 2015-05-20 NOTE — Progress Notes (Signed)
Pt placed on cpap for the night.  Pt is tolerating well and resting comfortably.

## 2015-05-20 NOTE — Clinical Social Work Note (Signed)
Clinical Social Work Assessment  Patient Details  Name: Isaac [name] MRN: [id number] Date of Birth: 1955-06-15  Date of referral:  05/20/15               Reason for consult:  Facility Placement                Permission sought to share information with:  Chartered certified accountant granted to share information::  Yes, Verbal Permission Granted  Name::        Agency::  [facility]  Relationship::     Contact Information:     Housing/Transportation Living arrangements for the past 2 months:  [place] of Information:  Patient Patient Interpreter Needed:  None Criminal Activity/Legal Involvement Pertinent to Current Situation/Hospitalization:  No - Comment as needed Significant Relationships:  Siblings, Parents Lives with:  Self Do you feel safe going back to the place where you live?  Yes Need for family participation in patient care:  No (Coment)  Care giving concerns: Pt lives alone   Facilities manager / plan:  Pt stated that he is in full agreement with SNF as his d/c plan.  Pt stated that he lives alone and doesn't have family in the immediate area, so SNF is the best option.  Pt was in [facility] in [facility] and did not like that facility.  He is hopeful that [name] will accept him; he agreed to a county-wide search, minus [place] ([place]).  Employment status:  Disabled (Comment on whether or not currently receiving Disability) Insurance information:  Managed Medicare PT Recommendations:  [place] / Referral to community resources:     Patient/Family's Response to care:  Pt is in full agreement with SNF placement.  Patient/Family's Understanding of and Emotional Response to Diagnosis, Current Treatment, and Prognosis:  Pt feels that he is getting good care.  Emotional Assessment Appearance:  Appears stated  age Attitude/Demeanor/Rapport:   (cooperative) Affect (typically observed):  Accepting Orientation:  Oriented to Self, Oriented to Place, Oriented to  Time, Oriented to Situation Alcohol / Substance use:  Never Used Psych involvement (Current and /or in the community):  No (Comment)  Discharge Needs  Concerns to be addressed:  No discharge needs identified Readmission within the last 30 days:  No Current discharge risk:  None Barriers to Discharge:  No Barriers Identified   Isaac [name], Isaac [name] 05/20/2015, 1:18 PM

## 2015-05-20 NOTE — Progress Notes (Signed)
Subjective: 2 Days Post-Op Procedure(s) (LRB): TOTAL KNEE ARTHROPLASTY (Left) Patient reports pain as 6 on 0-10 scale.    Objective: Vital signs in last 24 hours: Temp:  [99.6 F (37.6 C)-100.6 F (38.1 C)] 99.6 F (37.6 C) (01/08 0611) Pulse Rate:  [86-87] 86 (01/08 0611) Resp:  [18] 18 (01/08 0611) BP: (127-134)/(76-81) 134/76 mmHg (01/08 0611) SpO2:  [96 %-97 %] 97 % (01/08 0611)  Intake/Output from previous day: 01/07 0701 - 01/08 0700 In: 1195 [P.O.:360; I.V.:835] Out: 800 [Urine:800] Intake/Output this shift:     Recent Labs  05/19/15 0455 05/20/15 0335  HGB 12.9* 12.9*    Recent Labs  05/19/15 0455 05/20/15 0335  WBC 6.3 7.4  RBC 4.12* 4.12*  HCT 39.3 39.8  PLT 143* 158    Recent Labs  05/19/15 0455  NA 138  K 4.1  CL 104  CO2 28  BUN 12  CREATININE 1.05  GLUCOSE 129*  CALCIUM 8.7*   No results for input(s): LABPT, INR in the last 72 hours.  ABD soft Neurovascular intact Sensation intact distally Intact pulses distally Dorsiflexion/Plantar flexion intact Incision: dressing C/D/I  Assessment/Plan: 2 Days Post-Op Procedure(s) (LRB): TOTAL KNEE ARTHROPLASTY (Left)  Principal Problem:   Primary localized osteoarthritis of left knee Active Problems:   Essential hypertension   OSA (obstructive sleep apnea)   DM (diabetes mellitus) type 2, uncontrolled, with ketoacidosis ([place])   Anxiety and depression  Advance diet Up with therapy Plan for discharge tomorrow SNF vs home depending on insurance.  Patient is requesting SNF at [facility]  Patient is still complaining of pain despite multiple narcotics.  He prefers IV meds.  I explained that he would only get IV meds if he was still in need of medication after exhausting all other PO medications.  Mazie Fencl J 05/20/2015, 1:41 PM

## 2015-05-20 NOTE — Progress Notes (Signed)
Physical Therapy Treatment Patient Details Name: Isaac [name] MRN: [id number] DOB: 1955-08-23 Today's Date: 05/20/2015    History of Present Illness 60 y.o. male s/p left total knee arthroplasty. Hx of DM II, anxiety, bladder cancer, and HTN.    PT Comments    Making slow progress, but still participates with good effort despite pain  Follow Up Recommendations  SNF     Equipment Recommendations  Rolling walker with 5" wheels;3in1 (PT)    Recommendations for Other Services       Precautions / Restrictions Precautions Precautions: Knee Precaution Comments: hanout given  Restrictions LLE Weight Bearing: Weight bearing as tolerated    Mobility  Bed Mobility Overal bed mobility: Needs Assistance Bed Mobility: Sit to Supine       Sit to supine: Min assist   General bed mobility comments: Min assist to help LLE into bed  Transfers Overall transfer level: Needs assistance Equipment used: Rolling walker (2 wheeled) Transfers: Sit to/from Stand Sit to Stand: Min guard         General transfer comment: Cues for hand position and technique  Ambulation/Gait Ambulation/Gait assistance: Min guard Ambulation Distance (Feet): 60 Feet Assistive device: Rolling walker (2 wheeled) Gait Pattern/deviations: Step-through pattern     General Gait Details: Verbal cues for safe RW use, upright posture, to activate quad for stance stability, and WBAT status.  Pt limited by pain and fatigue, but was able to get up to min guard assist by the end of gait for safety.    Stairs            Wheelchair Mobility    Modified Rankin (Stroke Patients Only)       Balance             Standing balance-Leahy Scale: Poor                      Cognition Arousal/Alertness: Awake/alert Behavior During Therapy: WFL for tasks assessed/performed Overall Cognitive Status: Within Functional Limits for tasks assessed                      Exercises Total  Joint Exercises Quad Sets: AROM;Left;10 reps Short Arc QuadSinclair [name];Left;10 reps Heel Slides: AAROM;Left;10 reps Straight Leg Raises: AAROM;Left;10 reps Goniometric ROM: approx 7-65, quite limited by pain this pm    General Comments        Pertinent Vitals/Pain Pain Assessment: 0-10 Pain Score: 10-Worst pain ever Pain Location: 10/10 L knee by the end of sesion Pain Descriptors / Indicators: Aching Pain Intervention(s): Limited activity within patient's tolerance;Monitored during session;Patient requesting pain meds-RN notified    Home Living                      Prior Function            PT Goals (current goals can now be found in the care plan section) Acute Rehab PT Goals Patient Stated Goal: Go to rehab and get stronger before I have my other knee replaced. PT Goal Formulation: With patient Time For Goal Achievement: 05/25/15 Potential to Achieve Goals: Good Progress towards PT goals: Progressing toward goals    Frequency  7X/week    PT Plan Current plan remains appropriate    Co-evaluation             End of Session   Activity Tolerance: Patient limited by pain Patient left: in bed;with call bell/phone within reach     Time:  [id number] PT Time Calculation (min) (ACUTE ONLY): 39 min  Charges:  $Gait Training: 8-22 mins $Therapeutic Exercise: 8-22 mins $Therapeutic Activity: 8-22 mins                    G Codes:      Isaac [name] 05/20/2015, 2:46 PM  Isaac [name], [place] Pager [phone] Office [phone]

## 2015-05-20 NOTE — Clinical Social Work Placement (Addendum)
   CLINICAL SOCIAL WORK PLACEMENT  NOTE  Date:  05/20/2015  Patient Details  Name: Isaac [name] MRN: [id number] Date of Birth: [birth year]  Clinical Social Work is seeking post-discharge placement for this patient at the [place] level of care (*CSW will initial, date and re-position this form in  chart as items are completed):  Yes   Patient/family provided with [facility] Work Department's list of facilities offering this level of care within the geographic area requested by the patient (or if unable, by the patient's family).  Yes   Patient/family informed of their freedom to choose among providers that offer the needed level of care, that participate in Medicare, Medicaid or managed care program needed by the patient, have an available bed and are willing to accept the patient.  Yes   Patient/family informed of [facility]'s ownership interest in [facility] and [facility], as well as of the fact that they are under no obligation to receive care at these facilities.  PASRR submitted to EDS on       PASRR number received on       Existing PASRR number confirmed on 05/20/15     FL2 transmitted to all facilities in geographic area requested by pt/family on 05/20/15     FL2 transmitted to all facilities within larger geographic area on       Patient informed that his/her managed care company has contracts with or will negotiate with certain facilities, including the following:         05/21/15   Patient/family informed of bed offers received.  (updated by Evette Cristal, MSW, LCSWA, 05/21/15)  Patient chooses bed at  [facility]  (updated by Evette Cristal, MSW, LCSWA, 05/21/15)    Physician recommends and patient chooses bed at      Patient to be transferred to  [facility] on  05/20/14  (updated by Evette Cristal, MSW, LCSWA, 05/21/15)  Patient to be transferred to facility by  [facility]  (updated by Evette Cristal, MSW, LCSWA, 05/21/15)      Patient family notified on  05/21/15 of transfer.  (updated by Evette Cristal, MSW, LCSWA, 05/21/15)  Name of family member notified:   Patient states he does not have any other family (updated by Evette Cristal, MSW, LCSWA, 05/21/15)   PHYSICIAN       Additional Comment:    _______________________________________________ Matilde Bash, LCSW 05/20/2015, 1:20 PM   Jones Broom. [place], MSW, [place] 05/21/2015 6:21 PM

## 2015-05-20 NOTE — Progress Notes (Signed)
Pt. Places himself on cpap. RT filled water reservoir up for pt.

## 2015-05-20 NOTE — NC FL2 (Signed)
[place] LEVEL OF CARE SCREENING TOOL     IDENTIFICATION  Patient Name: Isaac [name] Birthdate: 1955/07/14 Sex: male Admission Date (Current Location): 05/18/2015  [facility] and [place] Number:  Herbalist and Address:  [facility], [place] [address], [place], [place] [address]      Provider Number: [id number]  Attending Physician Name and Address:  Earlie Server, MD  Relative Name and Phone Number:       Current Level of Care: Hospital Recommended Level of Care: [place] Prior Approval Number:    Date Approved/Denied:   PASRR Number: [id number]  Discharge Plan: SNF    Current Diagnoses: Patient Active Problem List   Diagnosis Date Noted  . Primary localized osteoarthritis of left knee 05/18/2015  . Personal history of colonic polyps 09/07/2013  . DM (diabetes mellitus) type 2, uncontrolled, with ketoacidosis ([place]) 06/19/2011  . Anxiety and depression 06/19/2011  . History of skin cancer 06/19/2011  . BLADDER CANCER , UNSPEC. 05/10/2008  . HYPERLIPIDEMIA-MIXED 05/10/2008  . CAD, NATIVE VESSEL 05/10/2008  . RESTLESS LEGS SYNDROME 11/17/2007  . Essential hypertension 11/16/2007  . OSA (obstructive sleep apnea) 11/16/2007    Orientation RESPIRATION BLADDER Height & Weight    Self, Time, Situation, Place  Normal Continent   196 lbs.  BEHAVIORAL SYMPTOMS/MOOD NEUROLOGICAL BOWEL NUTRITION STATUS      Continent  (diabetic)  AMBULATORY STATUS COMMUNICATION OF NEEDS Skin   Limited Assist Verbally Surgical wounds                       Personal Care Assistance Level of Assistance  Bathing, Dressing Bathing Assistance: Limited assistance   Dressing Assistance: Limited assistance     Functional Limitations Info             SPECIAL CARE FACTORS FREQUENCY   (sliding scale insulin 4 times per day)                    Contractures      Additional Factors Info                 Current Medications (05/20/2015):  This is the current hospital active medication list Current Facility-Administered Medications  Medication Dose Route Frequency Provider Last Rate Last Dose  . 0.9 %  sodium chloride infusion   Intravenous Continuous Chriss Czar, PA-C   Stopped at 05/19/15 0129  . acetaminophen (TYLENOL) tablet 650 mg  650 mg Oral Q6H PRN Chriss Czar, PA-C   650 mg at 05/19/15 2100   Or  . acetaminophen (TYLENOL) suppository 650 mg  650 mg Rectal Q6H PRN Chriss Czar, PA-C      . alum & mag hydroxide-simeth (MAALOX/MYLANTA) 200-200-20 MG/5ML suspension 30 mL  30 mL Oral Q4H PRN Joshua Chadwell, PA-C      . amLODipine (NORVASC) tablet 5 mg  5 mg Oral BID Joshua Chadwell, PA-C   5 mg at 05/20/15 1025  . apixaban (ELIQUIS) tablet 2.5 mg  2.5 mg Oral Q12H Joshua Chadwell, PA-C   2.5 mg at 05/20/15 1026  . bisacodyl (DULCOLAX) EC tablet 10 mg  10 mg Oral QHS Kirstin Shepperson, PA-C   10 mg at 05/19/15 2054  . diazepam (VALIUM) tablet 2-4 mg  2-4 mg Oral Q6H PRN Kirstin Shepperson, PA-C   4 mg at 05/20/15 [id number]  . diphenhydrAMINE (BENADRYL) 12.5 MG/5ML elixir 12.5-25 mg  12.5-25 mg Oral Q4H PRN Chriss Czar, PA-C      .  docusate sodium (COLACE) capsule 100 mg  100 mg Oral BID Joshua Chadwell, PA-C   100 mg at 05/20/15 1025  . HYDROmorphone (DILAUDID) injection 1 mg  1 mg Intravenous Q2H PRN Earlie Server, MD   1 mg at 05/20/15 0056  . HYDROmorphone (DILAUDID) tablet 2-4 mg  2-4 mg Oral Q3H PRN Kirstin Shepperson, PA-C   4 mg at 05/20/15 1026  . insulin aspart (novoLOG) injection 0-20 Units  0-20 Units Subcutaneous TID WC Joshua Chadwell, PA-C   3 Units at 05/20/15 1024  . insulin aspart (novoLOG) injection 0-5 Units  0-5 Units Subcutaneous QHS Chriss Czar, PA-C   5 Units at 05/19/15 2200  . insulin aspart (novoLOG) injection 6 Units  6 Units Subcutaneous TID WC Joshua Chadwell, PA-C   6 Units at 05/20/15 1024  . isosorbide mononitrate (IMDUR) 24 hr tablet 120 mg   120 mg Oral q morning - 10a Joshua Chadwell, PA-C   120 mg at 05/20/15 1025  . menthol-cetylpyridinium (CEPACOL) lozenge 3 mg  1 lozenge Oral PRN Chriss Czar, PA-C       Or  . phenol (CHLORASEPTIC) mouth spray 1 spray  1 spray Mouth/Throat PRN Joshua Chadwell, PA-C      . metoCLOPramide (REGLAN) tablet 5-10 mg  5-10 mg Oral Q8H PRN Joshua Chadwell, PA-C       Or  . metoCLOPramide (REGLAN) injection 5-10 mg  5-10 mg Intravenous Q8H PRN Joshua Chadwell, PA-C   10 mg at 05/19/15 1713  . metoprolol succinate (TOPROL-XL) 24 hr tablet 25 mg  25 mg Oral QPM Joshua Chadwell, PA-C   25 mg at 05/19/15 1712  . nitroGLYCERIN (NITROSTAT) SL tablet 0.4 mg  0.4 mg Sublingual Q5 min PRN Joshua Chadwell, PA-C      . ondansetron (ZOFRAN) tablet 4 mg  4 mg Oral Q6H PRN Chriss Czar, PA-C   4 mg at 05/20/15 1026   Or  . ondansetron (ZOFRAN) injection 4 mg  4 mg Intravenous Q6H PRN Chriss Czar, PA-C   4 mg at 05/19/15 0853  . pantoprazole (PROTONIX) EC tablet 40 mg  40 mg Oral q morning - 10a Joshua Chadwell, PA-C   40 mg at 05/20/15 1026  . polyethylene glycol (MIRALAX / GLYCOLAX) packet 17 g  17 g Oral Daily PRN Chriss Czar, PA-C      . rOPINIRole (REQUIP) tablet 1.5 mg  1.5 mg Oral QHS Joshua Chadwell, PA-C   1.5 mg at 05/19/15 2055  . rosuvastatin (CRESTOR) tablet 10 mg  10 mg Oral Daily Joshua Chadwell, PA-C   10 mg at 05/20/15 1026  . sodium phosphate (FLEET) 7-19 GM/118ML enema 1 enema  1 enema Rectal Once PRN Chriss Czar, PA-C         Discharge Medications: Please see discharge summary for a list of discharge medications.  Relevant Imaging Results:  Relevant Lab Results:   Additional Information    Moshe Cipro Berneice Heinrich, LCSW

## 2015-05-20 NOTE — Care Management Note (Signed)
Case Management Note  Patient Details  Name: Isaac [name] MRN: [id number] Date of Birth: [birth year]  Subjective/Objective:                    Action/Plan:Disposition is now deferred to CSW as pt will be going to SNF at discharge.  Anticipate discharge to [facility] 05/21/2015. No further CM needs but will be available should additional discharge needs arise.  Expected Discharge Date:                  Expected Discharge Plan:  Skilled Nursing Facility  In-House Referral:  NA, Clinical Social Work  Discharge planning Services  CM Consult  Post Acute Care Choice:    Choice offered to:     DME Arranged:    DME Agency:     HH Arranged:    [place] Agency:     Status of Service:  Completed, signed off  Medicare Important Message Given:    Date Medicare IM Given:    Medicare IM give by:    Date Additional Medicare IM Given:    Additional Medicare Important Message give by:     If discussed at [place] of Stay Meetings, dates discussed:    Additional Comments:  Delrae Sawyers, RN 05/20/2015, 3:00 PM

## 2015-05-21 ENCOUNTER — Encounter (HOSPITAL_COMMUNITY): Payer: Self-pay | Admitting: Orthopedic Surgery

## 2015-05-21 LAB — GLUCOSE, CAPILLARY
GLUCOSE-CAPILLARY: 156 mg/dL — AB (ref 65–99)
Glucose-Capillary: 102 mg/dL — ABNORMAL HIGH (ref 65–99)
Glucose-Capillary: 132 mg/dL — ABNORMAL HIGH (ref 65–99)

## 2015-05-21 LAB — CBC
HCT: 35.9 % — ABNORMAL LOW (ref 39.0–52.0)
HEMOGLOBIN: 12 g/dL — AB (ref 13.0–17.0)
MCH: 31.8 pg (ref 26.0–34.0)
MCHC: 33.4 g/dL (ref 30.0–36.0)
MCV: 95.2 fL (ref 78.0–100.0)
PLATELETS: 145 10*3/uL — AB (ref 150–400)
RBC: 3.77 MIL/uL — AB (ref 4.22–5.81)
RDW: 12.8 % (ref 11.5–15.5)
WBC: 6 10*3/uL (ref 4.0–10.5)

## 2015-05-21 MED ORDER — DOCUSATE SODIUM 100 MG PO CAPS: 100.0000 mg | ORAL_CAPSULE | Freq: Two times a day (BID) | ORAL | Status: DC

## 2015-05-21 MED ORDER — ACETAMINOPHEN 325 MG PO TABS: 650.0000 mg | ORAL_TABLET | Freq: Four times a day (QID) | ORAL | Status: DC | PRN

## 2015-05-21 NOTE — Progress Notes (Signed)
Physical Therapy Treatment Patient Details Name: Isaac [name] MRN: [id number] DOB: 05-27-1955 Today's Date: 05/21/2015    History of Present Illness 60 y.o. male s/p left total knee arthroplasty. Hx of DM II, anxiety, bladder cancer, and HTN.    PT Comments    Patient with overall mobility level of min A/min guard for transfers and gait training. Pt continues to be limited by pain. Ability to ambulate 80 ft with two standing rest breaks. Continue to progress as tolerated with anticipation of d/c to SNF.   Follow Up Recommendations  SNF     Equipment Recommendations  Rolling walker with 5" wheels;3in1 (PT)    Recommendations for Other Services       Precautions / Restrictions Precautions Precautions: Knee Precaution Comments: hanout given  Restrictions Weight Bearing Restrictions: Yes LLE Weight Bearing: Weight bearing as tolerated    Mobility  Bed Mobility Overal bed mobility: Needs Assistance Bed Mobility: Sit to Supine;Supine to Sit     Supine to sit: Supervision Sit to supine: Min assist   General bed mobility comments: supervision for safety and use of bedrail, overhead trapeze, and R LE assist to bring L LE to EOB; min A to bring L LE onto bed with vc for technique  Transfers Overall transfer level: Needs assistance Equipment used: Rolling walker (2 wheeled) Transfers: Sit to/from Stand Sit to Stand: Min guard         General transfer comment: vc for hand placement and technique  Ambulation/Gait Ambulation/Gait assistance: Min guard Ambulation Distance (Feet): 80 Feet Assistive device: Rolling walker (2 wheeled) Gait Pattern/deviations: Step-through pattern;Decreased step length - right;Decreased stance time - left;Antalgic;Trunk flexed   Gait velocity interpretation: Below normal speed for age/gender General Gait Details: vc for sequencing and position of RW, upright posture and L heel strike; two standing rest breaks required   Stairs             Wheelchair Mobility    Modified Rankin (Stroke Patients Only)       Balance Overall balance assessment: Needs assistance Sitting-balance support: Feet supported Sitting balance-Leahy Scale: Normal     Standing balance support: Bilateral upper extremity supported Standing balance-Leahy Scale: Poor                      Cognition Arousal/Alertness: Awake/alert Behavior During Therapy: WFL for tasks assessed/performed Overall Cognitive Status: Within Functional Limits for tasks assessed                      Exercises Total Joint Exercises Quad Sets: AROM;Left;10 reps Short Arc QuadSinclair Ship;Left;10 reps Heel Slides: AAROM;Left;10 reps Straight Leg Raises: AAROM;Left;10 reps Goniometric ROM: 5-60    General Comments        Pertinent Vitals/Pain Pain Assessment: 0-10 Pain Score: 10-Worst pain ever (8 while resting) Pain Location: L knee Pain Descriptors / Indicators: Aching;Sore Pain Intervention(s): Limited activity within patient's tolerance;Premedicated before session;Monitored during session;Repositioned    Home Living                      Prior Function            PT Goals (current goals can now be found in the care plan section) Acute Rehab PT Goals Patient Stated Goal: Go to rehab and get stronger before I have my other knee replaced. PT Goal Formulation: With patient Time For Goal Achievement: 05/25/15 Potential to Achieve Goals: Good Progress towards PT goals: Progressing toward goals  Frequency  7X/week    PT Plan Current plan remains appropriate    Co-evaluation             End of Session Equipment Utilized During Treatment: Gait belt Activity Tolerance: Patient limited by pain Patient left: in bed;with call bell/phone within reach     Time: 1032-1057 PT Time Calculation (min) (ACUTE ONLY): 25 min  Charges:  $Gait Training: 8-22 mins $Therapeutic Exercise: 8-22 mins                    G Codes:       [place] April, PTA Pager: [phone]   05/21/2015, 11:05 AM

## 2015-05-21 NOTE — Discharge Instructions (Addendum)
INSTRUCTIONS AFTER JOINT REPLACEMENT  ° °o Remove items at home which could result in a fall. This includes throw rugs or furniture in walking pathways °o ICE to the affected joint every three hours while awake for 30 minutes at a time, for at least the first 3-5 days, and then as needed for pain and swelling.  Continue to use ice for pain and swelling. You may notice swelling that will progress down to the foot and ankle.  This is normal after surgery.  Elevate your leg when you are not up walking on it.   °o Continue to use the breathing machine you got in the hospital (incentive spirometer) which will help keep your temperature down.  It is common for your temperature to cycle up and down following surgery, especially at night when you are not up moving around and exerting yourself.  The breathing machine keeps your lungs expanded and your temperature down. ° ° °DIET:  As you were doing prior to hospitalization, we recommend a well-balanced diet. ° °DRESSING / WOUND CARE / SHOWERING ° °You may change your dressing 3-5 days after surgery.  Then change the dressing every day with sterile gauze.  Please use good hand washing techniques before changing the dressing.  Do not use any lotions or creams on the incision until instructed by your surgeon. ° °ACTIVITY ° °o Increase activity slowly as tolerated, but follow the weight bearing instructions below.   °o No driving for 6 weeks or until further direction given by your physician.  You cannot drive while taking narcotics.  °o No lifting or carrying greater than 10 lbs. until further directed by your surgeon. °o Avoid periods of inactivity such as sitting longer than an hour when not asleep. This helps prevent blood clots.  °o You may return to work once you are authorized by your doctor.  ° ° ° °WEIGHT BEARING  ° °Weight bearing as tolerated with assist device (walker, cane, etc) as directed, use it as long as suggested by your surgeon or therapist, typically at  least 4-6 weeks. ° ° °EXERCISES ° °Results after joint replacement surgery are often greatly improved when you follow the exercise, range of motion and muscle strengthening exercises prescribed by your doctor. Safety measures are also important to protect the joint from further injury. Any time any of these exercises cause you to have increased pain or swelling, decrease what you are doing until you are comfortable again and then slowly increase them. If you have problems or questions, call your caregiver or physical therapist for advice.  ° °Rehabilitation is important following a joint replacement. After just a few days of immobilization, the muscles of the leg can become weakened and shrink (atrophy).  These exercises are designed to build up the tone and strength of the thigh and leg muscles and to improve motion. Often times heat used for twenty to thirty minutes before working out will loosen up your tissues and help with improving the range of motion but do not use heat for the first two weeks following surgery (sometimes heat can increase post-operative swelling).  ° °These exercises can be done on a training (exercise) mat, on the floor, on a table or on a bed. Use whatever works the best and is most comfortable for you.    Use music or television while you are exercising so that the exercises are a pleasant break in your day. This will make your life better with the exercises acting as a break   in your routine that you can look forward to.   Perform all exercises about fifteen times, three times per day or as directed.  You should exercise both the operative leg and the other leg as well. ° °Exercises include: °  °• Quad Sets - Tighten up the muscle on the front of the thigh (Quad) and hold for 5-10 seconds.   °• Straight Leg Raises - With your knee straight (if you were given a brace, keep it on), lift the leg to 60 degrees, hold for 3 seconds, and slowly lower the leg.  Perform this exercise against  resistance later as your leg gets stronger.  °• Leg Slides: Lying on your back, slowly slide your foot toward your buttocks, bending your knee up off the floor (only go as far as is comfortable). Then slowly slide your foot back down until your leg is flat on the floor again.  °• Angel Wings: Lying on your back spread your legs to the side as far apart as you can without causing discomfort.  °• Hamstring Strength:  Lying on your back, push your heel against the floor with your leg straight by tightening up the muscles of your buttocks.  Repeat, but this time bend your knee to a comfortable angle, and push your heel against the floor.  You may put a pillow under the heel to make it more comfortable if necessary.  ° °A rehabilitation program following joint replacement surgery can speed recovery and prevent re-injury in the future due to weakened muscles. Contact your doctor or a physical therapist for more information on knee rehabilitation.  ° ° °CONSTIPATION ° °Constipation is defined medically as fewer than three stools per week and severe constipation as less than one stool per week.  Even if you have a regular bowel pattern at home, your normal regimen is likely to be disrupted due to multiple reasons following surgery.  Combination of anesthesia, postoperative narcotics, change in appetite and fluid intake all can affect your bowels.  ° °YOU MUST use at least one of the following options; they are listed in order of increasing strength to get the job done.  They are all available over the counter, and you may need to use some, POSSIBLY even all of these options:   ° °Drink plenty of fluids (prune juice may be helpful) and high fiber foods °Colace 100 mg by mouth twice a day  °Senokot for constipation as directed and as needed Dulcolax (bisacodyl), take with full glass of water  °Miralax (polyethylene glycol) once or twice a day as needed. ° °If you have tried all these things and are unable to have a bowel  movement in the first 3-4 days after surgery call either your surgeon or your primary doctor.   ° °If you experience loose stools or diarrhea, hold the medications until you stool forms back up.  If your symptoms do not get better within 1 week or if they get worse, check with your doctor.  If you experience "the worst abdominal pain ever" or develop nausea or vomiting, please contact the office immediately for further recommendations for treatment. ° ° °ITCHING:  If you experience itching with your medications, try taking only a single pain pill, or even half a pain pill at a time.  You can also use Benadryl over the counter for itching or also to help with sleep.  ° °TED HOSE STOCKINGS:  Use stockings on both legs until for at least 2 weeks or as   directed by physician office. They may be removed at night for sleeping.  MEDICATIONS:  See your medication summary on the After Visit Summary that nursing will review with you.  You may have some home medications which will be placed on hold until you complete the course of blood thinner medication.  It is important for you to complete the blood thinner medication as prescribed.  PRECAUTIONS:  If you experience chest pain or shortness of breath - call 911 immediately for transfer to the hospital emergency department.   If you develop a fever greater that 101 F, purulent drainage from wound, increased redness or drainage from wound, foul odor from the wound/dressing, or calf pain - CONTACT YOUR SURGEON.                                                   FOLLOW-UP APPOINTMENTS:  If you do not already have a post-op appointment, please call the office for an appointment to be seen by your surgeon.  Guidelines for how soon to be seen are listed in your After Visit Summary, but are typically between 1-4 weeks after surgery.  OTHER INSTRUCTIONS:   Knee Replacement:  Do not place pillow under knee, focus on keeping the knee straight while resting. CPM  instructions: 0-90 degrees, 2 hours in the morning, 2 hours in the afternoon, and 2 hours in the evening. Place foam block, curve side up under heel at all times except when in CPM or when walking.  DO NOT modify, tear, cut, or change the foam block in any way.  MAKE SURE YOU:   Understand these instructions.   Get help right away if you are not doing well or get worse.    Thank you for letting us be a part of your medical care team.  It is a privilege we respect greatly.  We hope these instructions will help you stay on track for a fast and full recovery!   Information on my medicine - ELIQUIS (apixaban)  This medication education was reviewed with me or my healthcare representative as part of my discharge preparation.  The pharmacist that spoke with me during my hospital stay was:  Duayne Cal, [facility]  Why was Eliquis prescribed for you? Eliquis was prescribed for you to reduce the risk of blood clots forming after orthopedic surgery.    What do You need to know about Eliquis? Take your Eliquis TWICE DAILY - one tablet in the morning and one tablet in the evening with or without food.  It would be best to take the dose about the same time each day.  If you have difficulty swallowing the tablet whole please discuss with your pharmacist how to take the medication safely.  Take Eliquis exactly as prescribed by your doctor and DO NOT stop taking Eliquis without talking to the doctor who prescribed the medication.  Stopping without other medication to take the place of Eliquis may increase your risk of developing a clot.  After discharge, you should have regular check-up appointments with your healthcare provider that is prescribing your Eliquis.  What do you do if you miss a dose? If a dose of ELIQUIS is not taken at the scheduled time, take it as soon as possible on the same day and twice-daily administration should be resumed.  The dose should not be doubled  to make up for a  missed dose.  Do not take more than one tablet of ELIQUIS at the same time.  Important Safety Information A possible side effect of Eliquis is bleeding. You should call your healthcare provider right away if you experience any of the following: ? Bleeding from an injury or your nose that does not stop. ? Unusual colored urine (red or dark brown) or unusual colored stools (red or black). ? Unusual bruising for unknown reasons. ? A serious fall or if you hit your head (even if there is no bleeding).  Some medicines may interact with Eliquis and might increase your risk of bleeding or clotting while on Eliquis. To help avoid this, consult your healthcare provider or pharmacist prior to using any new prescription or non-prescription medications, including herbals, vitamins, non-steroidal anti-inflammatory drugs (NSAIDs) and supplements.  This website has more information on Eliquis (apixaban): http://www.eliquis.com/eliquis/home

## 2015-05-21 NOTE — Discharge Summary (Signed)
DISCHARGE SUMMARY  ADMISSION DATE:    05/18/2015 DISCHARGE DATE:   05/21/2015   ADMISSION DIAGNOSIS: OA LEFT KNEE    DISCHARGE DIAGNOSIS:  OA LEFT KNEE    ADDITIONAL DIAGNOSIS: Principal Problem:   Primary localized osteoarthritis of left knee Active Problems:   Essential hypertension   OSA (obstructive sleep apnea)   DM (diabetes mellitus) type 2, uncontrolled, with ketoacidosis (HCC)   Anxiety and depression  Past Medical History  Diagnosis Date  . Hypertension   . Dyslipidemia     pt unable to tolerate niaspan  . GERD (gastroesophageal reflux disease)   . Diabetes mellitus     followed by Dr. Dwyane Dee  . History of non-ST elevation myocardial infarction (NSTEMI) 2005  . 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 History of angina CHRONIC -- CONTROLLED W/ IMDUR  . Peripheral neuropathy (HCC) RIGHT LEG NUMBNESS  . Coronary artery disease     followed by Dr. [name]; Lexiscan Myoview (10/14):  Low risk, EF 57%, small inf infarct  . Coronary vasospasm ([place]) PER DR ROSS NOTE 03-22-2012--  INTER. TIGHTNESS    PER PT PROBABLE FROM STRESS  . Depression     no rx  . Arthritis   . Bladder cancer [facility]) followed by dr Risa Grill  . Bladder cancer ([place])   . OSA on CPAP     PROCEDURE: Procedure(s): TOTAL KNEE ARTHROPLASTY Left on 05/18/2015  CONSULTS: PT/OT/Resp      HISTORY:  See H&P in chart  HOSPITAL COURSE:  Isaac [name] is a 60 y.o. admitted on 05/18/2015 and found to have a diagnosis of OA LEFT KNEE.  After appropriate laboratory studies were obtained  they were taken to the operating room on 05/18/2015 and underwent  Procedure(s): TOTAL KNEE ARTHROPLASTY  Left.   They were given perioperative antibiotics:  Anti-infectives    Start     Dose/Rate Route Frequency Ordered Stop   05/18/15 1530  ceFAZolin (ANCEF) IVPB 1 g/50 mL premix     1 g 100 mL/hr over 30 Minutes Intravenous Every 6 hours 05/18/15 1446 05/18/15 2225    05/18/15 0500  ceFAZolin (ANCEF) IVPB 2 g/50 mL premix     2 g 100 mL/hr over 30 Minutes Intravenous To [facility] 05/17/15 1317 05/18/15 0745    .  Tolerated the procedure well.  Toradol was given post op.  POD #1, allowed out of bed to a chair.  PT for ambulation and exercise program.  IV saline locked.  O2 discontionued.  POD #2, continued PT and ambulation.  Hemovac pulled. .  The remainder of the hospital course was dedicated to ambulation and strengthening.   The patient was discharged on 3 Days Post-Op in  Stable condition.  Blood products given:none  DIAGNOSTIC STUDIES: Recent vital signs: Patient Vitals for the past 24 hrs:  BP Temp Temp src Pulse Resp SpO2  05/21/15 0421 138/84 mmHg 98.1 F (36.7 C) Oral 88 18 96 %  05/20/15 2134 (!) 147/96 mmHg 98 F (36.7 C) Oral 90 17 98 %  05/20/15 1500 (!) 143/83 mmHg 100.1 F (37.8 C) Oral 99 18 94 %       Recent laboratory studies:  Recent Labs  05/19/15 0455 05/20/15 0335 05/21/15 0611  WBC 6.3 7.4 6.0  HGB 12.9* 12.9* 12.0*  HCT 39.3 39.8 35.9*  PLT 143* 158 145*    Recent Labs  05/19/15 0455  NA 138  K 4.1  CL 104  CO2 28  BUN 12  CREATININE 1.05  GLUCOSE 129*  CALCIUM 8.7*   Lab Results  Component Value Date   INR 1.13 05/08/2015   INR 1.1* 01/25/2015     Recent Radiographic Studies :  Dg Chest 2 View  05/08/2015  CLINICAL DATA:  Pre operative respiratory exam. Osteoarthritis of the left knee. For total knee arthroplasty on 05/18/2015. EXAM: CHEST  2 VIEW COMPARISON:  10/26/2008 FINDINGS: The heart size and mediastinal contours are within normal limits. Both lungs are clear. The visualized skeletal structures are unremarkable. IMPRESSION: Normal chest. Electronically Signed   By: Lorriane Shire M.D.   On: 05/08/2015 14:18    DISCHARGE INSTRUCTIONS:   DISCHARGE MEDICATIONS:     Medication List    TAKE these medications        acetaminophen 325 MG tablet  Commonly known as:  TYLENOL   Take 2 tablets (650 mg total) by mouth every 6 (six) hours as needed for mild pain (or Fever >/= 101).     amLODipine 5 MG tablet  Commonly known as:  NORVASC  Take 1 tablet (5 mg total) by mouth 2 (two) times daily.     apixaban 2.5 MG Tabs tablet  Commonly known as:  ELIQUIS  Take 1 tablet (2.5 mg total) by mouth 2 (two) times daily.     aspirin EC 81 MG tablet  Take 1 tablet (81 mg total) by mouth daily.     canagliflozin 300 MG Tabs tablet  Commonly known as:  INVOKANA  Take 300 mg by mouth daily.     diazepam 10 MG tablet  Commonly known as:  VALIUM  Take 10 mg by mouth every 6 (six) hours as needed for anxiety.     docusate sodium 100 MG capsule  Commonly known as:  COLACE  Take 1 capsule (100 mg total) by mouth 2 (two) times daily.     glimepiride 2 MG tablet  Commonly known as:  AMARYL  Take 1 tablet (2 mg total) by mouth daily before supper.     HYDROmorphone 2 MG tablet  Commonly known as:  DILAUDID  1-2 tabs po q4-6hrs prn pain     isosorbide mononitrate 120 MG 24 hr tablet  Commonly known as:  IMDUR  Take 1 tablet (120 mg total) by mouth every morning.     metFORMIN 1000 MG tablet  Commonly known as:  GLUCOPHAGE  Take 1 tablet (1,000 mg total) by mouth 2 (two) times daily with a meal.     metoprolol succinate 25 MG 24 hr tablet  Commonly known as:  TOPROL-XL  Take 1 tablet (25 mg total) by mouth every evening.     nitroGLYCERIN 0.4 MG SL tablet  Commonly known as:  NITROSTAT  Place 1 tablet (0.4 mg total) under the tongue every 5 (five) minutes as needed for chest pain.     pantoprazole 40 MG tablet  Commonly known as:  PROTONIX  Take 1 tablet (40 mg total) by mouth every morning.     rOPINIRole 1 MG tablet  Commonly known as:  REQUIP  Take 1.5 tablets (1.5 mg total) by mouth at bedtime. Take 1 tab by mouth at bedtime (along with the 0.5mg  tab for a total of 1.5mg  daily)     rosuvastatin 10 MG tablet  Commonly known as:  CRESTOR  Take 10 mg by  mouth daily.     rosuvastatin 10 MG tablet  Commonly known as:  CRESTOR  TAKE 1 BY MOUTH DAILY        FOLLOW UP VISIT:       Follow-up Information    Follow up with CAFFREY JR,W D, MD. Schedule an appointment as soon as possible for a visit in 2 weeks.   Specialty:  Orthopedic Surgery   Contact information:   [place] [address] [phone]       Follow up with [facility]   Why:  They will contact you to schedule home therapy visits.    Contact information:   [place] [address] [phone]       DISPOSITION:   Skilled Nursing Facility/Rehab  CONDITION:  Stable   Chriss Czar, PA-C  05/21/2015 8:37 AM

## 2015-05-21 NOTE — Clinical Social Work Note (Signed)
Patient to be d/c'ed today to [facility].  Patient and family agreeable to plans will transport via ems RN to call report to [phone].  Evette Cristal, MSW, [place]

## 2015-05-22 ENCOUNTER — Non-Acute Institutional Stay (SKILLED_NURSING_FACILITY): Payer: Medicare Other | Admitting: Adult Health

## 2015-05-22 ENCOUNTER — Encounter: Payer: Self-pay | Admitting: Adult Health

## 2015-05-22 DIAGNOSIS — K5900 Constipation, unspecified: Secondary | ICD-10-CM

## 2015-05-22 DIAGNOSIS — E785 Hyperlipidemia, unspecified: Secondary | ICD-10-CM | POA: Diagnosis not present

## 2015-05-22 DIAGNOSIS — E1310 Other specified diabetes mellitus with ketoacidosis without coma: Secondary | ICD-10-CM | POA: Diagnosis not present

## 2015-05-22 DIAGNOSIS — I2510 Atherosclerotic heart disease of native coronary artery without angina pectoris: Secondary | ICD-10-CM | POA: Diagnosis not present

## 2015-05-22 DIAGNOSIS — G4733 Obstructive sleep apnea (adult) (pediatric): Secondary | ICD-10-CM

## 2015-05-22 DIAGNOSIS — K219 Gastro-esophageal reflux disease without esophagitis: Secondary | ICD-10-CM

## 2015-05-22 DIAGNOSIS — G2581 Restless legs syndrome: Secondary | ICD-10-CM | POA: Diagnosis not present

## 2015-05-22 DIAGNOSIS — M1712 Unilateral primary osteoarthritis, left knee: Secondary | ICD-10-CM | POA: Diagnosis not present

## 2015-05-22 DIAGNOSIS — F419 Anxiety disorder, unspecified: Secondary | ICD-10-CM

## 2015-05-22 DIAGNOSIS — I10 Essential (primary) hypertension: Secondary | ICD-10-CM | POA: Diagnosis not present

## 2015-05-22 DIAGNOSIS — E1110 Type 2 diabetes mellitus with ketoacidosis without coma: Secondary | ICD-10-CM

## 2015-05-22 NOTE — Progress Notes (Signed)
Patient ID: Isaac [name], male   DOB: 1955-10-14, 60 y.o.   MRN: [id number]    DATE:  05/22/2015   MRN:  [id number]  BIRTHDAY: 1955-07-22  Facility:  Nursing Home Location:  [place] Room Number: 507-P  LEVEL OF CARE:  SNF (31)  Contact Information    Name Stuart Father [phone]  [phone]   Loni Muse;Laurena Spies    [phone]       Code Status History    Date Active Date Inactive Code Status Order ID Comments User Context   05/18/2015  2:46 PM 05/21/2015  6:54 PM Full Code [id number]  Chriss Czar, PA-C Inpatient       Chief Complaint  Patient presents with  . Hospitalization Follow-up    Osteoarthritis S/P left total knee arthroplasty, hypertension, CAD, diabetes mellitus, GERD, RLS and HLD    HISTORY OF PRESENT ILLNESS:  This is a 60 year old male who has been admitted to [facility] on 05/21/15 from [facility]. He has PMH of hypertension, dyslipidemia, GERD, diabetes mellitus, history of NTEMI, peripheral neuropathy, CAD and OSA on  CPAP. He has osteoarthritis for which she had left total knee arthroplasty on 05/18/15. He has been admitted for a short-term rehabilitation.  He is complaining of severe pain on his left knee. He is able to move LLE.  PAST MEDICAL HISTORY:  Past Medical History  Diagnosis Date  . Hypertension   . Dyslipidemia     pt unable to tolerate niaspan  . GERD (gastroesophageal reflux disease)   . Diabetes mellitus     followed by Dr. Dwyane Dee  . History of non-ST elevation myocardial infarction (NSTEMI) 2005  . 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 History of angina CHRONIC -- CONTROLLED W/ IMDUR  . Peripheral neuropathy (HCC) RIGHT LEG NUMBNESS  . Coronary artery disease     followed by Dr. [name]; Lexiscan Myoview (10/14):  Low risk, EF 57%, small inf infarct  . Coronary vasospasm ([place]) PER DR ROSS NOTE  03-22-2012--  INTER. TIGHTNESS    PER PT PROBABLE FROM STRESS  . Depression     no rx  . Arthritis   . Bladder cancer [facility]) followed by dr Risa Grill  . Bladder cancer ([place])   . OSA on CPAP      CURRENT MEDICATIONS: Reviewed  Patient's Medications  New Prescriptions   No medications on file  Previous Medications   ACETAMINOPHEN (TYLENOL) 325 MG TABLET    Take 2 tablets (650 mg total) by mouth every 6 (six) hours as needed for mild pain (or Fever >/= 101).   AMLODIPINE (NORVASC) 5 MG TABLET    Take 1 tablet (5 mg total) by mouth 2 (two) times daily.   APIXABAN (ELIQUIS) 2.5 MG TABS TABLET    Take 1 tablet (2.5 mg total) by mouth 2 (two) times daily.   ASPIRIN EC 81 MG TABLET    Take 1 tablet (81 mg total) by mouth daily.   CANAGLIFLOZIN (INVOKANA) 300 MG TABS TABLET    Take 300 mg by mouth daily.   DIAZEPAM (VALIUM) 10 MG TABLET    Take 10 mg by mouth every 6 (six) hours as needed for anxiety.    DOCUSATE SODIUM (COLACE) 100 MG CAPSULE    Take 1 capsule (100 mg total) by mouth 2 (two) times daily.   GLIMEPIRIDE (AMARYL) 2 MG TABLET    Take 1  tablet (2 mg total) by mouth daily before supper.   HYDROMORPHONE (DILAUDID) 2 MG TABLET    Take 2 mg by mouth every 4 (four) hours as needed for severe pain. 1-2 tabs Q 4 hours PRN   ISOSORBIDE MONONITRATE (IMDUR) 120 MG 24 HR TABLET    Take 1 tablet (120 mg total) by mouth every morning.   METFORMIN (GLUCOPHAGE) 1000 MG TABLET    Take 1 tablet (1,000 mg total) by mouth 2 (two) times daily with a meal.   METOPROLOL SUCCINATE (TOPROL-XL) 25 MG 24 HR TABLET    Take 1 tablet (25 mg total) by mouth every evening.   NITROGLYCERIN (NITROSTAT) 0.4 MG SL TABLET    Place 1 tablet (0.4 mg total) under the tongue every 5 (five) minutes as needed for chest pain.   PANTOPRAZOLE (PROTONIX) 40 MG TABLET    Take 1 tablet (40 mg total) by mouth every morning.   ROPINIROLE (REQUIP) 1 MG TABLET    Take 1.5 tablets (1.5 mg total) by mouth at bedtime. Take 1 tab by mouth at  bedtime (along with the 0.5mg  tab for a total of 1.5mg  daily)   ROSUVASTATIN (CRESTOR) 10 MG TABLET    Take 10 mg by mouth daily.  Modified Medications   No medications on file  Discontinued Medications   HYDROMORPHONE (DILAUDID) 2 MG TABLET    1-2 tabs po q4-6hrs prn pain   ROSUVASTATIN (CRESTOR) 10 MG TABLET    TAKE 1 BY MOUTH DAILY     No Known Allergies   REVIEW OF SYSTEMS:  GENERAL: no change in appetite, no fatigue, no weight changes, no fever, chills or weakness EYES: Denies change in vision, dry eyes, eye pain, itching or discharge EARS: Denies change in hearing, ringing in ears, or earache NOSE: Denies nasal congestion or epistaxis MOUTH and THROAT: Denies oral discomfort, gingival pain or bleeding, pain from teeth or hoarseness   RESPIRATORY: no cough, SOB, DOE, wheezing, hemoptysis CARDIAC: no chest pain, edema or palpitations GI: no abdominal pain, diarrhea, constipation, heart burn, nausea or vomiting GU: Denies dysuria, frequency, hematuria, incontinence, or discharge PSYCHIATRIC: Denies feeling of depression or anxiety. No report of hallucinations, insomnia, paranoia, or agitation   PHYSICAL EXAMINATION  GENERAL APPEARANCE: Well nourished. In no acute distress. Normal body habitus SKIN:  Left knee is covered with dressing and ACE wrap; dry HEAD: Normal in size and contour. No evidence of trauma EYES: Lids open and close normally. No blepharitis, entropion or ectropion. PERRL. Conjunctivae are clear and sclerae are white. Lenses are without opacity EARS: Pinnae are normal. Patient hears normal voice tunes of the examiner MOUTH and THROAT: Lips are without lesions. Oral mucosa is moist and without lesions. Tongue is normal in shape, size, and color and without lesions NECK: supple, trachea midline, no neck masses, no thyroid tenderness, no thyromegaly LYMPHATICS: no LAN in the neck, no supraclavicular LAN RESPIRATORY: breathing is even & unlabored, BS CTAB CARDIAC:  RRR, no murmur,no extra heart sounds, no edema GI: abdomen soft, normal BS, no masses, no tenderness, no hepatomegaly, no splenomegaly EXTREMITIES:  Able to move X 4 extremities PSYCHIATRIC: Alert and oriented X 3. Affect and behavior are appropriate  LABS/RADIOLOGY: Labs reviewed: Basic Metabolic Panel:  Recent Labs  04/23/15 1053 05/08/15 1356 05/19/15 0455  NA 137 138 138  K 4.2 4.4 4.1  CL 103 102 104  CO2 28 27 28   GLUCOSE 138* 215* 129*  BUN 15 12 12   CREATININE 1.07 1.08 1.05  CALCIUM 9.0 9.7 8.7*   Liver Function Tests:  Recent Labs  08/21/14 1013 12/20/14 1050 05/08/15 1356  AST 40* 40* 50*  ALT 63* 77* 66*  ALKPHOS 44 43 55  BILITOT 1.0 0.4 1.1  PROT 6.9 6.3 7.2  ALBUMIN 4.2 3.8 4.2   CBC:  Recent Labs  05/08/15 1356 05/19/15 0455 05/20/15 0335 05/21/15 0611  WBC 6.2 6.3 7.4 6.0  NEUTROABS 3.8  --   --   --   HGB 17.2* 12.9* 12.9* 12.0*  HCT 49.8 39.3 39.8 35.9*  MCV 94.3 95.4 96.6 95.2  PLT 143* 143* 158 145*   Lipid Panel:  Recent Labs  08/21/14 1013  HDL 31.80*   CBG:  Recent Labs  05/20/15 2139 05/21/15 0619 05/21/15 1115  GLUCAP 121* 132* 156*     Dg Chest 2 View  05/08/2015  CLINICAL DATA:  Pre operative respiratory exam. Osteoarthritis of the left knee. For total knee arthroplasty on 05/18/2015. EXAM: CHEST  2 VIEW COMPARISON:  10/26/2008 FINDINGS: The heart size and mediastinal contours are within normal limits. Both lungs are clear. The visualized skeletal structures are unremarkable. IMPRESSION: Normal chest. Electronically Signed   By: Lorriane Shire M.D.   On: 05/08/2015 14:18    ASSESSMENT/PLAN:  Osteoarthritis S/P left total knee arthroplasty - for rehabilitation; continue Tylenol 325 mg 2 tabs = 650 mg every 6 hours when necessary and Dilaudid 2 mg 1-2 tabs by mouth every  4 hours PRN for pain, physiatry consult; Eliquis 2.5 mg 1 tab by mouth twice a day for DVT prophylaxis; follow-up with Dr. [name], orthopedic  surgeon, in 2 weeks; check CBC  Hypertension - continue amlodipine 5 mg 1 tab daily and Toprol-XL 25 mg daily; check BMP  CAD - continue aspirin EC 81 mg daily and Imdur 24 hour 1 tab by mouth every morning  Diabetes mellitus, type II - hemoglobin A1c 8.5; continue Invokana 300 mg 1 tab by mouth daily, glimepiride 2 mg 1 tab daily and metformin 1000 mg twice a day  GERD - continue Protonix 40 mg 1 tab by mouth every morning  RLS - continue Requip 1 mg 1 tab by mouth daily at bedtime  Hyperlipidemia - continue Crestor 10 mg 1 tab  daily  Anxiety - mood is stable; continue Valium 10 mg 1 tab by mouth every 6 hours when necessary  Constipation - continue Colace 100 mg 1 capsule by mouth twice a day  OSA - continue CPAP     Goals of care:  Short-term rehabilitation     [facility], NP  [facility] [phone]

## 2015-05-23 ENCOUNTER — Other Ambulatory Visit: Payer: Self-pay | Admitting: *Deleted

## 2015-05-23 ENCOUNTER — Non-Acute Institutional Stay (SKILLED_NURSING_FACILITY): Payer: Medicare Other | Admitting: Internal Medicine

## 2015-05-23 DIAGNOSIS — G2581 Restless legs syndrome: Secondary | ICD-10-CM | POA: Diagnosis not present

## 2015-05-23 DIAGNOSIS — K5900 Constipation, unspecified: Secondary | ICD-10-CM

## 2015-05-23 DIAGNOSIS — E118 Type 2 diabetes mellitus with unspecified complications: Secondary | ICD-10-CM | POA: Diagnosis not present

## 2015-05-23 DIAGNOSIS — R2681 Unsteadiness on feet: Secondary | ICD-10-CM | POA: Diagnosis not present

## 2015-05-23 DIAGNOSIS — D62 Acute posthemorrhagic anemia: Secondary | ICD-10-CM

## 2015-05-23 DIAGNOSIS — L22 Diaper dermatitis: Secondary | ICD-10-CM | POA: Diagnosis not present

## 2015-05-23 DIAGNOSIS — M1712 Unilateral primary osteoarthritis, left knee: Secondary | ICD-10-CM | POA: Diagnosis not present

## 2015-05-23 DIAGNOSIS — R21 Rash and other nonspecific skin eruption: Secondary | ICD-10-CM

## 2015-05-23 DIAGNOSIS — K219 Gastro-esophageal reflux disease without esophagitis: Secondary | ICD-10-CM

## 2015-05-23 DIAGNOSIS — I10 Essential (primary) hypertension: Secondary | ICD-10-CM | POA: Diagnosis not present

## 2015-05-23 DIAGNOSIS — I2510 Atherosclerotic heart disease of native coronary artery without angina pectoris: Secondary | ICD-10-CM | POA: Diagnosis not present

## 2015-05-23 DIAGNOSIS — E785 Hyperlipidemia, unspecified: Secondary | ICD-10-CM | POA: Diagnosis not present

## 2015-05-23 MED ORDER — HYDROMORPHONE HCL 2 MG PO TABS: ORAL_TABLET | ORAL | Status: DC

## 2015-05-23 MED ORDER — DIAZEPAM 10 MG PO TABS: 10.0000 mg | ORAL_TABLET | Freq: Four times a day (QID) | ORAL | Status: DC | PRN

## 2015-05-23 NOTE — Telephone Encounter (Signed)
[facility] 

## 2015-05-23 NOTE — Progress Notes (Signed)
Patient ID: Isaac [name], male   DOB: [birth year], 60 y.o.   MRN: [id number]      [facility]  PCP: Elby Showers, MD  Code Status: full code  No Known Allergies  Chief Complaint  Patient presents with  . New Admit To SNF     HPI:  60 y.o. patient is here for short term rehabilitation post hospital admission from 05/18/15-05/21/15 with left knee OA. He underwent left total knee arthroplasty. He is seen in his room today with his friend present. His pain had not been under control on arrival to the facility. His dilaudid was increased by physiatrist yesterday. Today, his pain is under control. He has been constipated. He was started on a bowel regimen last night. He has occasional headache. He has been having itching on his back and groin area x 1 day now  Review of Systems:  Constitutional: Negative for fever, chills HENT: Negative for headache, congestion, nasal discharge, difficulty swallowing.   Eyes: Negative for blurred vision, double vision and discharge.  Respiratory: Negative for cough, shortness of breath and wheezing.   Cardiovascular: Negative for chest pain, palpitations, leg swelling.  Gastrointestinal: Negative for heartburn, nausea, vomiting, abdominal pain. No bowel movement for 5 days Genitourinary: Negative for dysuria and flank pain.  Musculoskeletal: Negative for back pain, falls in the facility Neurological: Negative for dizziness Psychiatric/Behavioral: Negative for depression   Past Medical History  Diagnosis Date  . Hypertension   . Dyslipidemia     pt unable to tolerate niaspan  . GERD (gastroesophageal reflux disease)   . Diabetes mellitus     followed by Dr. Dwyane Dee  . History of non-ST elevation myocardial infarction (NSTEMI) 2005  . 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 History of angina CHRONIC -- CONTROLLED W/ IMDUR  . Peripheral neuropathy (HCC) RIGHT LEG NUMBNESS    . Coronary artery disease     followed by Dr. [name]; Lexiscan Myoview (10/14):  Low risk, EF 57%, small inf infarct  . Coronary vasospasm ([place]) PER DR ROSS NOTE 03-22-2012--  INTER. TIGHTNESS    PER PT PROBABLE FROM STRESS  . Depression     no rx  . Arthritis   . Bladder cancer [facility]) followed by dr Risa Grill  . Bladder cancer ([place])   . OSA on CPAP    Past Surgical History  Procedure Laterality Date  . Umbillical hernia repair  2004  . Elbow surgery  07-07-2003    RIGHT ELBOW ARTHROSCOPY W/ OPEN RECONSTRUCTION  . Shoulder surgery  2005    RIGHT ROTATOR CUFF REPAIR  . Cystoscopy with biopsy  04/07/2011    Procedure: CYSTOSCOPY WITH BIOPSY/ RIGHT URETEROSCOPY;  Surgeon: Bernestine Amass, MD;  Location: [facility];  Service: Urology;  Laterality: N/A;    cystoscopy, cystogram, biopsy and fulgeration  . Cysto/ resection bladder tumor/ left retrograde pyelogram/ right ureteroscopy  08-07-2010    TCC OF BLADDER   S/P DISTAL URETERECTOMY/ REIMPLANTATION  . Cysto/ cystogram/ ureteroscopy  02-21-2009  . Right distal ureterectomy w/ reimplantation  10-30-2008    TCC OF RIGHT DISTAL URETER  . Cysto/ right ureteroscopy/ bx ureteral tumor  10-11-2008  . Cysto/ bladder bx  09-13-2008;  08-13-2007; 04-19-2007; 06-01-2006  . Cysto/ bilateral retrograde pyelogram/ right ureteroscopic laser fulguration ureteral tumor  02-16-2008  . Rhinoplasty w/ modified cartilage graft  05-17-2007    INTERNAL NASAL VALVE COLLAPSE/ OSA/ SEPTAL PERFERATION  .  Transurethral resection of bladder tumor  01-13-2007  . Appendectomy  08-03-2004  . Knee arthroscopy w/ debridement  02-08-2004    RIGHT KNEE  . Cardiac catheterization  x3  last one 06-17-2007    MILD NON-OBSTRUCTIVE CAD/ NORMAL LVF/ 30% LEFT RENAL ARTERY STENOSIS  . Cystoscopy with biopsy  12/29/2011    Procedure: CYSTOSCOPY WITH BIOPSY;  Surgeon: Bernestine Amass, MD;  Location: [facility];  Service: Urology;  Laterality:  N/A;  [place]    . Cystoscopy w/ retrogrades  04/19/2012    Procedure: CYSTOSCOPY WITH RETROGRADE PYELOGRAM;  Surgeon: Bernestine Amass, MD;  Location: [facility];  Service: Urology;  Laterality: Bilateral;  30 MINS Cysto, Biopsy, possible TURBT, Bilateral retrograde pyelograms with Mitomycin C instilation Post op   . Transurethral resection of bladder tumor  04/19/2012    Procedure: TRANSURETHRAL RESECTION OF BLADDER TUMOR (TURBT);  Surgeon: Bernestine Amass, MD;  Location: [facility];  Service: Urology;  Laterality: N/A;  . Total knee arthroplasty Left 05/18/2015  . Total knee arthroplasty Left 05/18/2015    Procedure: TOTAL KNEE ARTHROPLASTY;  Surgeon: Earlie Server, MD;  Location: [place];  Service: Orthopedics;  Laterality: Left;   Social History:   reports that he quit smoking about 8 years ago. His smoking use included Cigarettes. He has a 10 pack-year smoking history. He has never used smokeless tobacco. He reports that he does not drink alcohol or use illicit drugs.  Family History  Problem Relation Age of Onset  . Coronary artery disease Father   . Colon cancer Neg Hx   . Stomach cancer Neg Hx   . Diabetes Maternal Grandmother   . Heart disease Paternal Grandfather     Medications:   Medication List       This list is accurate as of: 05/23/15  1:58 PM.  Always use your most recent med list.               acetaminophen 325 MG tablet  Commonly known as:  TYLENOL  Take 2 tablets (650 mg total) by mouth every 6 (six) hours as needed for mild pain (or Fever >/= 101).     amLODipine 5 MG tablet  Commonly known as:  NORVASC  Take 1 tablet (5 mg total) by mouth 2 (two) times daily.     apixaban 2.5 MG Tabs tablet  Commonly known as:  ELIQUIS  Take 1 tablet (2.5 mg total) by mouth 2 (two) times daily.     aspirin EC 81 MG tablet  Take 1 tablet (81 mg total) by mouth daily.     canagliflozin 300 MG Tabs tablet  Commonly known  as:  INVOKANA  Take 300 mg by mouth daily.     diazepam 10 MG tablet  Commonly known as:  VALIUM  Take 1 tablet (10 mg total) by mouth every 6 (six) hours as needed for anxiety.     docusate sodium 100 MG capsule  Commonly known as:  COLACE  Take 1 capsule (100 mg total) by mouth 2 (two) times daily.     glimepiride 2 MG tablet  Commonly known as:  AMARYL  Take 1 tablet (2 mg total) by mouth daily before supper.     HYDROmorphone 2 MG tablet  Commonly known as:  DILAUDID  Take one tablet by mouth every 3 hours as needed for pain between 4-6. Hold for sedation     isosorbide mononitrate 120 MG  24 hr tablet  Commonly known as:  IMDUR  Take 1 tablet (120 mg total) by mouth every morning.     metFORMIN 1000 MG tablet  Commonly known as:  GLUCOPHAGE  Take 1 tablet (1,000 mg total) by mouth 2 (two) times daily with a meal.     metoprolol succinate 25 MG 24 hr tablet  Commonly known as:  TOPROL-XL  Take 1 tablet (25 mg total) by mouth every evening.     nitroGLYCERIN 0.4 MG SL tablet  Commonly known as:  NITROSTAT  Place 1 tablet (0.4 mg total) under the tongue every 5 (five) minutes as needed for chest pain.     pantoprazole 40 MG tablet  Commonly known as:  PROTONIX  Take 1 tablet (40 mg total) by mouth every morning.     rOPINIRole 1 MG tablet  Commonly known as:  REQUIP  Take 1.5 tablets (1.5 mg total) by mouth at bedtime. Take 1 tab by mouth at bedtime (along with the 0.5mg  tab for a total of 1.5mg  daily)     rosuvastatin 10 MG tablet  Commonly known as:  CRESTOR  Take 10 mg by mouth daily.         Physical Exam: Filed Vitals:   05/23/15 1355  BP: 154/100  Pulse: 94  Temp: 98.5 F (36.9 C)  Resp: 18    General- adult male, well built, in no acute distress Head- normocephalic, atraumatic Nose- no maxillary or frontal sinus tenderness, no nasal discharge Throat- moist mucus membrane Eyes- PERRLA, EOMI, no pallor, no icterus, no discharge, normal  conjunctiva, normal sclera Neck- no cervical lymphadenopathy Cardiovascular- normal s1,s2, no murmurs, palpable dorsalis pedis and radial pulses, trace leg edema Respiratory- bilateral clear to auscultation, no wheeze, no rhonchi, no crackles, no use of accessory muscles Abdomen- bowel sounds present, soft, non tender Musculoskeletal- able to move all 4 extremities, limited left knee ROM, left leg in ace wrap and has ice pack in place Neurological- no focal deficit, alert and oriented to person, place and time Skin- warm and dry, erythematous macular rash to his back and erythematous rash to groin area, skin intact Psychiatry- normal mood and affect    Labs reviewed: Basic Metabolic Panel:  Recent Labs  04/23/15 1053 05/08/15 1356 05/19/15 0455  NA 137 138 138  K 4.2 4.4 4.1  CL 103 102 104  CO2 28 27 28   GLUCOSE 138* 215* 129*  BUN 15 12 12   CREATININE 1.07 1.08 1.05  CALCIUM 9.0 9.7 8.7*   Liver Function Tests:  Recent Labs  08/21/14 1013 12/20/14 1050 05/08/15 1356  AST 40* 40* 50*  ALT 63* 77* 66*  ALKPHOS 44 43 55  BILITOT 1.0 0.4 1.1  PROT 6.9 6.3 7.2  ALBUMIN 4.2 3.8 4.2   No results for input(s): LIPASE, AMYLASE in the last 8760 hours. No results for input(s): AMMONIA in the last 8760 hours. CBC:  Recent Labs  05/08/15 1356 05/19/15 0455 05/20/15 0335 05/21/15 0611  WBC 6.2 6.3 7.4 6.0  NEUTROABS 3.8  --   --   --   HGB 17.2* 12.9* 12.9* 12.0*  HCT 49.8 39.3 39.8 35.9*  MCV 94.3 95.4 96.6 95.2  PLT 143* 143* 158 145*   Cardiac Enzymes: No results for input(s): CKTOTAL, CKMB, CKMBINDEX, TROPONINI in the last 8760 hours. BNP: Invalid input(s): POCBNP CBG:  Recent Labs  05/20/15 2139 05/21/15 0619 05/21/15 1115  GLUCAP 121* 132* 156*    Radiological Exams: Dg Chest 2 View  05/08/2015  CLINICAL DATA:  Pre operative respiratory exam. Osteoarthritis of the left knee. For total knee arthroplasty on 05/18/2015. EXAM: CHEST  2 VIEW  COMPARISON:  10/26/2008 FINDINGS: The heart size and mediastinal contours are within normal limits. Both lungs are clear. The visualized skeletal structures are unremarkable. IMPRESSION: Normal chest. Electronically Signed   By: Lorriane Shire M.D.   On: 05/08/2015 14:18     Assessment/Plan  Unsteady gait Post left knee surgery. Will have him work with physical therapy and occupational therapy team to help with gait training and muscle strengthening exercises.fall precautions. Skin care. Encourage to be out of bed.   Left knee Osteoarthritis  S/P left total knee arthroplasty. Has f/u with orthopedics. Will have patient work with PT/OT as tolerated to regain strength and restore function.  Fall precautions are in place. Continue dilaudid 2 mg 1-2 tab q3h prn pain, being followed by PMR. Continue valium 5 mg q6h prn muscle spasm. Continue Eliquis 2.5 mg bid for DVT prophylaxis.  Blood loss anemia Post op, monitor cbc  Hypertension Elevated BP, currently on norvac 5 mg bid. change to amlodipine to 10 mg daily. change Toprol-XL to 50 mg daily and continue imdur 120 mg daily. Check bp bid and bmp  Back rash With moisture, start triamcinolone cream 0.01% bid to back area x 1 week and benadryl 25 mg q8h prn itching x 5 days  Diaper rash With moisture, keep area clean and dry and apply nystatin powder bid x 1 week and reassess  Constipation On miralax 17 g bid and senna s 2 tab qhs started last night, no signs of bowel obstruction on exam. Monitor clinically, hydration encouraged  gerd Stable on protonix 40 mg daily, monitor  CAD Remains chest pain free. Continue baby aspirin, toprol xl, imdur, statin and prn NTG.   HLD Continue crestor 10 mg daily  Diabetes mellitus, type II A1c 8.5. Continue Invokana 300 mg daily, glimepiride 2 mg daily and metformin 1000 mg bid, monitor cbg  RLS continue Requip 1 mg qhs   Goals of care: short term rehabilitation   Labs/tests ordered: cbc,  bmp  Family/ staff Communication: reviewed care plan with patient and nursing supervisor    Blanchie Serve, MD  [facility] [phone] (Monday-Friday 8 am - 5 pm) [phone] (afterhours)

## 2015-06-01 ENCOUNTER — Encounter: Payer: Self-pay | Admitting: Adult Health

## 2015-06-01 ENCOUNTER — Non-Acute Institutional Stay (SKILLED_NURSING_FACILITY): Payer: Medicare Other | Admitting: Adult Health

## 2015-06-01 DIAGNOSIS — E1310 Other specified diabetes mellitus with ketoacidosis without coma: Secondary | ICD-10-CM | POA: Diagnosis not present

## 2015-06-01 DIAGNOSIS — K219 Gastro-esophageal reflux disease without esophagitis: Secondary | ICD-10-CM | POA: Diagnosis not present

## 2015-06-01 DIAGNOSIS — I2510 Atherosclerotic heart disease of native coronary artery without angina pectoris: Secondary | ICD-10-CM | POA: Diagnosis not present

## 2015-06-01 DIAGNOSIS — E785 Hyperlipidemia, unspecified: Secondary | ICD-10-CM | POA: Diagnosis not present

## 2015-06-01 DIAGNOSIS — E1110 Type 2 diabetes mellitus with ketoacidosis without coma: Secondary | ICD-10-CM

## 2015-06-01 DIAGNOSIS — M1712 Unilateral primary osteoarthritis, left knee: Secondary | ICD-10-CM | POA: Diagnosis not present

## 2015-06-01 DIAGNOSIS — K5900 Constipation, unspecified: Secondary | ICD-10-CM | POA: Diagnosis not present

## 2015-06-01 DIAGNOSIS — I10 Essential (primary) hypertension: Secondary | ICD-10-CM | POA: Diagnosis not present

## 2015-06-01 DIAGNOSIS — G2581 Restless legs syndrome: Secondary | ICD-10-CM

## 2015-06-01 DIAGNOSIS — G4733 Obstructive sleep apnea (adult) (pediatric): Secondary | ICD-10-CM | POA: Diagnosis not present

## 2015-06-01 NOTE — Progress Notes (Signed)
Patient ID: Isaac [name], male   DOB: 28-Sep-1955, 60 y.o.   MRN: [id number]    DATE:  06/01/2015   MRN:  [id number]  BIRTHDAY: 14-Oct-1955  Facility:  [facility]ursing Home Location:  [place] Room Number: 507-P  LEVEL OF CARE:  SNF (31)  Contact Information    Name [place] Father [phone]  [phone]   Loni Muse;Laurena Spies    [phone]       Code Status History    Date Active Date Inactive Code Status Order ID Comments User Context   05/18/2015  2:46 PM 05/21/2015  6:54 PM Full Code [id number]  Chriss Czar, PA-C Inpatient       Chief Complaint  Patient presents with  . Discharge Note    Osteoarthritis S/P left total knee arthroplasty, hypertension, CAD, diabetes mellitus, GERD, RLS and HLD    HISTORY OF PRESENT ILLNESS:  This is a 60 year old male who is for discharge home with Home health for PT for endurance, OT for ADLs, CNA for showers and skilled nurse for disease management. DME:  3-in-1 bedside commode and rolling walker. He has been admitted to [facility] on 05/21/15 from [facility]. He has PMH of hypertension, dyslipidemia, GERD, diabetes mellitus, history of NTEMI, peripheral neuropathy, CAD and OSA on  CPAP. He has osteoarthritis for which she had left total knee arthroplasty on 05/18/15.   Patient was admitted to this facility for short-term rehabilitation after the patient's recent hospitalization.  Patient has completed SNF rehabilitation and therapy has cleared the patient for discharge.  PAST MEDICAL HISTORY:  Past Medical History  Diagnosis Date  . Hypertension   . Dyslipidemia     pt unable to tolerate niaspan  . GERD (gastroesophageal reflux disease)   . Diabetes mellitus     followed by Dr. Dwyane Dee  . History of non-ST elevation myocardial infarction (NSTEMI) 2005  . 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 History of angina CHRONIC --  CONTROLLED W/ IMDUR  . Peripheral neuropathy (HCC) RIGHT LEG NUMBNESS  . Coronary artery disease     followed by Dr. [name]; Lexiscan Myoview (10/14):  Low risk, EF 57%, small inf infarct  . Coronary vasospasm ([place]) PER DR ROSS NOTE 03-22-2012--  INTER. TIGHTNESS    PER PT PROBABLE FROM STRESS  . Depression     no rx  . Arthritis   . Bladder cancer [facility]) followed by dr Risa Grill  . Bladder cancer ([place])   . OSA on CPAP      CURRENT MEDICATIONS: Reviewed  Patient's Medications  New Prescriptions   No medications on file  Previous Medications   ACETAMINOPHEN (TYLENOL) 325 MG TABLET    Take 2 tablets (650 mg total) by mouth every 6 (six) hours as needed for mild pain (or Fever >/= 101).   AMLODIPINE (NORVASC) 10 MG TABLET    Take 10 mg by mouth daily. Hold for SBP < 110   APIXABAN (ELIQUIS) 2.5 MG TABS TABLET    Take 2.5 mg by mouth 2 (two) times daily.    ASPIRIN EC 81 MG TABLET    Take 1 tablet (81 mg total) by mouth daily.   ATORVASTATIN (LIPITOR) 10 MG TABLET    Take 10 mg by mouth daily.   CANAGLIFLOZIN (INVOKANA) 300 MG TABS TABLET    Take 300 mg by mouth daily.   DIAZEPAM (VALIUM) 5 MG TABLET  Take 5 mg by mouth every 6 (six) hours as needed for anxiety.   DOCUSATE SODIUM (COLACE) 100 MG CAPSULE    Take 1 capsule (100 mg total) by mouth 2 (two) times daily.   GLIMEPIRIDE (AMARYL) 2 MG TABLET    Take 1 tablet (2 mg total) by mouth daily before supper.   HYDROMORPHONE (DILAUDID) 2 MG TABLET    1 by mouth every 3 hours as needed for pain 4-6/10, 2 by mouth every 3 hours as needed for pain 7-10/10   ISOSORBIDE MONONITRATE (IMDUR) 120 MG 24 HR TABLET    Take 1 tablet (120 mg total) by mouth every morning.   METFORMIN (GLUCOPHAGE) 1000 MG TABLET    Take 1 tablet (1,000 mg total) by mouth 2 (two) times daily with a meal.   METOPROLOL SUCCINATE (TOPROL-XL) 50 MG 24 HR TABLET    Take 50 mg by mouth daily. Take with or immediately following a meal. Hold for SBP <110 ID<60   NITROGLYCERIN  (NITROSTAT) 0.4 MG SL TABLET    Place 1 tablet (0.4 mg total) under the tongue every 5 (five) minutes as needed for chest pain.   PANTOPRAZOLE (PROTONIX) 40 MG TABLET    Take 1 tablet (40 mg total) by mouth every morning.   POLYETHYLENE GLYCOL (MIRALAX / GLYCOLAX) PACKET    Take 17 g by mouth daily.   ROPINIROLE (REQUIP) 1 MG TABLET    1.5 tablets by mouth at bedtime for restless legs   SENNOSIDES-DOCUSATE SODIUM (SENOKOT-S) 8.6-50 MG TABLET    Take 2 tablets by mouth daily.  Modified Medications   No medications on file  Discontinued Medications   No medications on file     No Known Allergies   REVIEW OF SYSTEMS:  GENERAL: no change in appetite, no fatigue, no weight changes, no fever, chills or weakness EYES: Denies change in vision, dry eyes, eye pain, itching or discharge EARS: Denies change in hearing, ringing in ears, or earache NOSE: Denies nasal congestion or epistaxis MOUTH and THROAT: Denies oral discomfort, gingival pain or bleeding, pain from teeth or hoarseness   RESPIRATORY: no cough, SOB, DOE, wheezing, hemoptysis CARDIAC: no chest pain, edema or palpitations GI: no abdominal pain, diarrhea, constipation, heart burn, nausea or vomiting GU: Denies dysuria, frequency, hematuria, incontinence, or discharge PSYCHIATRIC: Denies feeling of depression or anxiety. No report of hallucinations, insomnia, paranoia, or agitation   PHYSICAL EXAMINATION  GENERAL APPEARANCE: Well nourished. In no acute distress. Normal body habitus SKIN:  Left knee surgical incision has steri-strips and dry dressing, no erythema HEAD: Normal in size and contour. No evidence of trauma EYES: Lids open and close normally. No blepharitis, entropion or ectropion. PERRL. Conjunctivae are clear and sclerae are white. Lenses are without opacity EARS: Pinnae are normal. Patient hears normal voice tunes of the examiner MOUTH and THROAT: Lips are without lesions. Oral mucosa is moist and without lesions.  Tongue is normal in shape, size, and color and without lesions NECK: supple, trachea midline, no neck masses, no thyroid tenderness, no thyromegaly LYMPHATICS: no LAN in the neck, no supraclavicular LAN RESPIRATORY: breathing is even & unlabored, BS CTAB CARDIAC: RRR, no murmur,no extra heart sounds, no edema GI: abdomen soft, normal BS, no masses, no tenderness, no hepatomegaly, no splenomegaly EXTREMITIES:  Able to move X 4 extremities PSYCHIATRIC: Alert and oriented X 3. Affect and behavior are appropriate  LABS/RADIOLOGY: Labs reviewed: 05/30/15  WBC 5.5 hemoglobin 12.3 hematocrit 38.6 MCV 96.3 platelet 266 hemoglobin A1c 7.3 Basic  Metabolic Panel:  Recent Labs  04/23/15 1053 05/08/15 1356 05/19/15 0455  NA 137 138 138  K 4.2 4.4 4.1  CL 103 102 104  CO2 28 27 28   GLUCOSE 138* 215* 129*  BUN 15 12 12   CREATININE 1.07 1.08 1.05  CALCIUM 9.0 9.7 8.7*   Liver Function Tests:  Recent Labs  08/21/14 1013 12/20/14 1050 05/08/15 1356  AST 40* 40* 50*  ALT 63* 77* 66*  ALKPHOS 44 43 55  BILITOT 1.0 0.4 1.1  PROT 6.9 6.3 7.2  ALBUMIN 4.2 3.8 4.2   CBC:  Recent Labs  05/08/15 1356 05/19/15 0455 05/20/15 0335 05/21/15 0611  WBC 6.2 6.3 7.4 6.0  NEUTROABS 3.8  --   --   --   HGB 17.2* 12.9* 12.9* 12.0*  HCT 49.8 39.3 39.8 35.9*  MCV 94.3 95.4 96.6 95.2  PLT 143* 143* 158 145*   Lipid Panel:  Recent Labs  08/21/14 1013  HDL 31.80*   CBG:  Recent Labs  05/20/15 2139 05/21/15 0619 05/21/15 1115  GLUCAP 121* 132* 156*     Dg Chest 2 View  05/08/2015  CLINICAL DATA:  Pre operative respiratory exam. Osteoarthritis of the left knee. For total knee arthroplasty on 05/18/2015. EXAM: CHEST  2 VIEW COMPARISON:  10/26/2008 FINDINGS: The heart size and mediastinal contours are within normal limits. Both lungs are clear. The visualized skeletal structures are unremarkable. IMPRESSION: Normal chest. Electronically Signed   By: Lorriane Shire M.D.   On: 05/08/2015  14:18    ASSESSMENT/PLAN:  Osteoarthritis S/P left total knee arthroplasty - for Home health PT, OT, skilled Nurse and CNA; continue Tylenol 325 mg 2 tabs = 650 mg every 6 hours when necessary and Dilaudid 2 mg 1-2 tabs by mouth every  4 hours PRN for pain ; Eliquis 2.5 mg 1 tab by mouth twice a day for DVT prophylaxis; Valium 5 mg 1 tab PO Q 6 hours PRN for muscle spasm;  follow-up with Dr. [name], orthopedic surgeon  Hypertension - recently increased amlodipine to 10 mg 1 tab daily and Toprol-XL to 50 mg daily  CAD - continue aspirin EC 81 mg daily and Imdur 24 hour 1 tab by mouth every morning  Diabetes mellitus, type II - hemoglobin A1c 8.5, recheck 7.3, improved; continue Invokana 300 mg 1 tab by mouth daily, glimepiride 2 mg 1 tab daily and metformin 1000 mg twice a day  GERD - continue Protonix 40 mg 1 tab by mouth every morning  RLS - continue Requip 1 mg  Take 1 1/2 = 1.5 mg tab by mouth daily at bedtime  Hyperlipidemia - continue Crestor 10 mg 1 tab  daily  Constipation - continue Colace 100 mg 1 capsule by mouth twice a day  OSA - continue CPAP      I have filled out patient's discharge paperwork and written prescriptions.  Patient will receive home health PT, OT, skilled Nurse and CNA.  DME provided:  3 in 1 bedside commode and rolling walker  Total discharge time: Greater than 30 minutes  Discharge time involved coordination of the discharge process with social worker, nursing staff and therapy department. Medical justification for home health services/DME verified.    [facility], NP  [employer] [phone]

## 2015-06-05 ENCOUNTER — Other Ambulatory Visit: Payer: Self-pay | Admitting: Endocrinology

## 2015-06-19 ENCOUNTER — Ambulatory Visit (INDEPENDENT_AMBULATORY_CARE_PROVIDER_SITE_OTHER): Payer: Medicare Other | Admitting: Internal Medicine

## 2015-06-19 ENCOUNTER — Encounter: Payer: Self-pay | Admitting: Internal Medicine

## 2015-06-19 VITALS — BP 120/80 | HR 69 | Temp 98.6°F | Ht 70.0 in | Wt 189.0 lb

## 2015-06-19 DIAGNOSIS — C679 Malignant neoplasm of bladder, unspecified: Secondary | ICD-10-CM | POA: Diagnosis not present

## 2015-06-19 DIAGNOSIS — I519 Heart disease, unspecified: Secondary | ICD-10-CM

## 2015-06-19 DIAGNOSIS — G4733 Obstructive sleep apnea (adult) (pediatric): Secondary | ICD-10-CM | POA: Diagnosis not present

## 2015-06-19 DIAGNOSIS — Z96652 Presence of left artificial knee joint: Secondary | ICD-10-CM | POA: Diagnosis not present

## 2015-06-19 DIAGNOSIS — Z85828 Personal history of other malignant neoplasm of skin: Secondary | ICD-10-CM

## 2015-06-19 DIAGNOSIS — Z0000 Encounter for general adult medical examination without abnormal findings: Secondary | ICD-10-CM

## 2015-06-19 DIAGNOSIS — I10 Essential (primary) hypertension: Secondary | ICD-10-CM

## 2015-06-19 DIAGNOSIS — F419 Anxiety disorder, unspecified: Secondary | ICD-10-CM

## 2015-06-19 DIAGNOSIS — I252 Old myocardial infarction: Secondary | ICD-10-CM | POA: Diagnosis not present

## 2015-06-19 DIAGNOSIS — E1121 Type 2 diabetes mellitus with diabetic nephropathy: Secondary | ICD-10-CM

## 2015-06-19 DIAGNOSIS — G2581 Restless legs syndrome: Secondary | ICD-10-CM | POA: Diagnosis not present

## 2015-06-19 DIAGNOSIS — F32A Depression, unspecified: Secondary | ICD-10-CM

## 2015-06-19 DIAGNOSIS — E785 Hyperlipidemia, unspecified: Secondary | ICD-10-CM | POA: Diagnosis not present

## 2015-06-19 DIAGNOSIS — F418 Other specified anxiety disorders: Secondary | ICD-10-CM | POA: Diagnosis not present

## 2015-06-19 DIAGNOSIS — F329 Major depressive disorder, single episode, unspecified: Secondary | ICD-10-CM

## 2015-06-19 DIAGNOSIS — E1142 Type 2 diabetes mellitus with diabetic polyneuropathy: Secondary | ICD-10-CM

## 2015-06-19 NOTE — Progress Notes (Signed)
Subjective:    Patient ID: Isaac [name], male    DOB: Jan 13, 1956, 60 y.o.   MRN: [id number]  HPI 60 year old Male not seen here since 2013 in to reestablish and have health maintenance exam and evaluation of medical issues. He has multiple specialists that take care of his many medical issues.  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 Risa Grill initially diagnosed by Dr. [name] in 2008, restless leg syndrome, status post total left knee arthroplasty January 2017.  Dr. Dwyane Dee is endocrinologist. Followed by cardiology. Pulmonary takes care of sleep apnea issues. Dr. [name] did his left knee arthroplasty. In December 2016, hemoglobin A1c was 8.5%. In August 2016 hemoglobin A1c was 7%.  He had umbilical hernia repair in 2004, right elbow arthroscopy with open reconstruction 2005, right rotator cuff repair 2005. Right knee arthroscopic with debridement 2005, multiple cystoscopies and resection of bladder tumor. Right distal ureterectomy with reimplantation 2010. Rhinoplasty with modified cartilage graft 2009. Appendectomy 2006.  Social history: He is not married. No children. Former smoker for many years but quit.  Formerly worked as Cabin crew. He receives disability benefits.Occasional alcohol consumption.  Had Prevnar 04/24/2015. Had Pneumovax 23 in October 2007 and again July 2010.  Dr. Herbert Deaner does annual eye exam.  History of left lower lobe pneumonia in 1995.  His most recent recurrence of bladder cancer was November 2013. He had a solitary tumor at the dome of his bladder and pathology revealed this to be a low-grade tumor and noninvasive. Was last seen by Dr. Risa Grill November 2016.  Family history: Father died of an accident at age 63. Father with history of CABG. Mother  died at age 80 apparently of accidental carbon monoxide poisoning when she locked herself out of the house and stayed in a running car to keep warm. Maternal grandmother with history of diabetes. One sister. Maternal grandfather with history of lung cancer.  No known drug allergies  Initially presented to this practice in 1989.  Colonoscopy 2007 by Dr. [name]    Review of Systems no new complaints     Objective:   Physical Exam  Constitutional: He is oriented to person, place, and time. He appears well-developed and well-nourished. No distress.  HENT:  Head: Normocephalic.  Right Ear: External ear normal.  Left Ear: External ear normal.  Mouth/Throat: No oropharyngeal exudate.  Eyes: Conjunctivae are normal. Pupils are equal, round, and reactive to light. Right eye exhibits no discharge. Left eye exhibits no discharge. No scleral icterus.  Neck: Neck supple. No JVD present. No thyromegaly present.  Cardiovascular: Normal rate, regular rhythm, normal heart sounds and intact distal pulses.   No murmur heard. Pulmonary/Chest: Effort normal and breath sounds normal. He has no wheezes. He has no rales.  Abdominal: Soft. Bowel sounds are normal. He exhibits no distension. There is no tenderness. There is no rebound.  Genitourinary:  Deferred to Dr. Risa Grill  Musculoskeletal: He exhibits no edema.  Lymphadenopathy:    He has no cervical adenopathy.  Neurological: He is alert and oriented to person, place, and time. He has normal reflexes. No cranial nerve deficit. Coordination normal.  Skin: Skin is warm and dry. He is not diaphoretic. No pallor.  Psychiatric: He has a normal mood and affect. His behavior is normal. Judgment and thought content normal.  Vitals  reviewed.         Assessment & Plan:  Controlled type 2 diabetes mellitus. Last hemoglobin A1c 8.5%. Usually he does a bit better diabetic control. Encouraged diet and exercise.  Essential  hypertension  Hyperlipidemia  History of bladder and ureter transitional cell carcinoma  Status post left knee arthroplasty January 2017  History of obstructive sleep apnea  Anxiety depression  GE reflux  Coronary disease status post non-ST elevation MI 2005  Peripheral neuropathy  Restless leg syndrome  Remote history of smoking  Plan: Return in one year or as needed. Try to keep diabetes under excellent control with multiple risk factors for coronary disease. Continue follow-up with Dr. Dwyane Dee and Dr. Risa Grill.  Subjective:   Patient presents for Medicare Annual/Subsequent preventive examination.  Review Past Medical/Family/Social:See above   Risk Factors  Current exercise habits: Once he recovers from the surgery, needs to develop diet and exercise regimen Dietary issues discussed: Low fat low carbohydrate  Cardiac risk factors:History of smoking, hyperlipidemia, diabetes mellitus  Depression Screen  (Note: if answer to either of the following is "Yes", a more complete depression screening is indicated)   Over the past two weeks, have you felt down, depressed or hopeless? Yes due to knee issue Over the past two weeks, have you felt little interest or pleasure in doing things? Yes due to knee issue Have you lost interest or pleasure in daily life? No Do you often feel hopeless? No Do you cry easily over simple problems? No   Activities of Daily Living  In your present state of health, do you have any difficulty performing the following activities?:   Driving? No Septal recent knee surgery Managing money? No  Feeding yourself? No  Getting from bed to chair? No  Climbing a flight of stairs? Yes due to knee issue and recent surgery Preparing food and eating?: No  Bathing or showering? Due to recent knee surgery Getting dressed: No  Getting to the toilet? No  Using the toilet:No  Moving around from place to place: Yes due to knee issue and recent surgery In the  past year have you fallen or had a near fall?: Yes due to knee issue Are you sexually active? No  Do you have more than one partner? No   Hearing Difficulties: No  Do you often ask people to speak up or repeat themselves? No  Do you experience ringing or noises in your ears? Yes Do you have difficulty understanding soft or whispered voices? Sometimes Do you feel that you have a problem with memory? No Do you often misplace items? Sometimes   Home Safety:  Do you have a smoke alarm at your residence? Yes Do you have grab bars in the bathroom?No Do you have throw rugs in your house?Yes   Cognitive Testing  Alert? Yes Normal Appearance?Yes  Oriented to person? Yes Place? Yes  Time? Yes  Recall of three objects? Yes  Can perform simple calculations? Yes  Displays appropriate judgment?Yes  Can read the correct time from a watch face?Yes   List the Names of Other Physician/Practitioners you currently use:  See referral list for the physicians patient is currently seeing.  Dr. Grandville Silos pulmonary  [facility] cardiology  Dr. Risa Grill  Dr. [name]   Review of Systems: See above   Objective:     General appearance: Appears stated age and mildly obese  Head: Normocephalic, without obvious abnormality, atraumatic  Eyes: conj clear, EOMi PEERLA  Ears: normal TM's and external  ear canals both ears  Nose: Nares normal. Septum midline. Mucosa normal. No drainage or sinus tenderness.  Throat: lips, mucosa, and tongue normal; teeth and gums normal  Neck: no adenopathy, no carotid bruit, no JVD, supple, symmetrical, trachea midline and thyroid not enlarged, symmetric, no tenderness/mass/nodules  No CVA tenderness.  Lungs: clear to auscultation bilaterally  Breasts: normal appearance, no masses or tenderness Heart: regular rate and rhythm, S1, S2 normal, no murmur, click, rub or gallop  Abdomen: soft, non-tender; bowel sounds normal; no masses, no organomegaly  Musculoskeletal: ROM  normal in all joints, no crepitus, no deformity, Normal muscle strengthen. Back  is symmetric, no curvature. Skin: Skin color, texture, turgor normal. No rashes or lesions  Lymph nodes: Cervical, supraclavicular, and axillary nodes normal.  Neurologic: CN 2 -12 Normal, Normal symmetric reflexes. Normal coordination and gait  Psych: Alert & Oriented x 3, Mood appear stable.    Assessment:    Annual wellness medicare exam   Plan:    During the course of the visit the patient was educated and counseled about appropriate screening and preventive services including:  Prevnar 41-- given 2016  Annual flu vaccine-given 2016      Patient Instructions (the written plan) was given to the patient.  Medicare Attestation  I have personally reviewed:  The patient's medical and social history  Their use of alcohol, tobacco or illicit drugs  Their current medications and supplements  The patient's functional ability including ADLs,fall risks, home safety risks, cognitive, and hearing and visual impairment  Diet and physical activities  Evidence for depression or mood disorders  The patient's weight, height, BMI, and visual acuity have been recorded in the chart. I have made referrals, counseling, and provided education to the patient based on review of the above and I have provided the patient with a written personalized care plan for preventive services.

## 2015-07-03 DIAGNOSIS — M1712 Unilateral primary osteoarthritis, left knee: Secondary | ICD-10-CM | POA: Diagnosis not present

## 2015-07-04 DIAGNOSIS — E119 Type 2 diabetes mellitus without complications: Secondary | ICD-10-CM | POA: Diagnosis not present

## 2015-07-10 DIAGNOSIS — M1712 Unilateral primary osteoarthritis, left knee: Secondary | ICD-10-CM | POA: Diagnosis not present

## 2015-07-12 DIAGNOSIS — M1712 Unilateral primary osteoarthritis, left knee: Secondary | ICD-10-CM | POA: Diagnosis not present

## 2015-07-13 DIAGNOSIS — M1712 Unilateral primary osteoarthritis, left knee: Secondary | ICD-10-CM | POA: Diagnosis not present

## 2015-07-16 DIAGNOSIS — M1712 Unilateral primary osteoarthritis, left knee: Secondary | ICD-10-CM | POA: Diagnosis not present

## 2015-07-18 DIAGNOSIS — M1712 Unilateral primary osteoarthritis, left knee: Secondary | ICD-10-CM | POA: Diagnosis not present

## 2015-07-20 ENCOUNTER — Other Ambulatory Visit (INDEPENDENT_AMBULATORY_CARE_PROVIDER_SITE_OTHER): Payer: Medicare Other

## 2015-07-20 DIAGNOSIS — M1712 Unilateral primary osteoarthritis, left knee: Secondary | ICD-10-CM | POA: Diagnosis not present

## 2015-07-20 DIAGNOSIS — E1165 Type 2 diabetes mellitus with hyperglycemia: Secondary | ICD-10-CM | POA: Diagnosis not present

## 2015-07-20 LAB — LIPID PANEL
CHOL/HDL RATIO: 5
Cholesterol: 156 mg/dL (ref 0–200)
HDL: 29.9 mg/dL — AB (ref >39.00)
NONHDL: 125.75
Triglycerides: 296 mg/dL — ABNORMAL HIGH (ref 0.0–149.0)
VLDL: 59.2 mg/dL — AB (ref 0.0–40.0)

## 2015-07-20 LAB — POCT URINALYSIS DIPSTICK
Blood, UA: NEGATIVE
KETONES UA: NEGATIVE
Leukocytes, UA: NEGATIVE
Nitrite, UA: NEGATIVE
Protein, UA: NEGATIVE
SPEC GRAV UA: 1.01
UROBILINOGEN UA: 0.2
pH, UA: 6

## 2015-07-20 LAB — HEMOGLOBIN A1C: Hgb A1c MFr Bld: 6.3 % (ref 4.6–6.5)

## 2015-07-20 LAB — COMPREHENSIVE METABOLIC PANEL
ALT: 31 U/L (ref 0–53)
AST: 20 U/L (ref 0–37)
Albumin: 4.2 g/dL (ref 3.5–5.2)
Alkaline Phosphatase: 56 U/L (ref 39–117)
BUN: 17 mg/dL (ref 6–23)
CHLORIDE: 101 meq/L (ref 96–112)
CO2: 25 meq/L (ref 19–32)
CREATININE: 0.8 mg/dL (ref 0.40–1.50)
Calcium: 9.2 mg/dL (ref 8.4–10.5)
GFR: 104.8 mL/min (ref >60.00)
GLUCOSE: 107 mg/dL — AB (ref 70–99)
POTASSIUM: 3.8 meq/L (ref 3.5–5.1)
SODIUM: 135 meq/L (ref 135–145)
Total Bilirubin: 0.5 mg/dL (ref 0.2–1.2)
Total Protein: 6.7 g/dL (ref 6.0–8.3)

## 2015-07-20 LAB — MICROALBUMIN / CREATININE URINE RATIO
Creatinine,U: 34.9 mg/dL
MICROALB UR: 1.1 mg/dL (ref 0.0–1.9)
Microalb Creat Ratio: 3.2 mg/g (ref 0.0–30.0)

## 2015-07-20 LAB — LDL CHOLESTEROL, DIRECT: LDL DIRECT: 96 mg/dL

## 2015-07-23 DIAGNOSIS — M1712 Unilateral primary osteoarthritis, left knee: Secondary | ICD-10-CM | POA: Diagnosis not present

## 2015-07-25 ENCOUNTER — Encounter: Payer: Self-pay | Admitting: Endocrinology

## 2015-07-25 ENCOUNTER — Ambulatory Visit (INDEPENDENT_AMBULATORY_CARE_PROVIDER_SITE_OTHER): Payer: Medicare Other | Admitting: Endocrinology

## 2015-07-25 VITALS — BP 126/84 | HR 80 | Temp 98.2°F | Resp 16 | Ht 70.0 in | Wt 187.0 lb

## 2015-07-25 DIAGNOSIS — M1712 Unilateral primary osteoarthritis, left knee: Secondary | ICD-10-CM | POA: Diagnosis not present

## 2015-07-25 DIAGNOSIS — K760 Fatty (change of) liver, not elsewhere classified: Secondary | ICD-10-CM | POA: Diagnosis not present

## 2015-07-25 DIAGNOSIS — E785 Hyperlipidemia, unspecified: Secondary | ICD-10-CM | POA: Diagnosis not present

## 2015-07-25 DIAGNOSIS — E119 Type 2 diabetes mellitus without complications: Secondary | ICD-10-CM

## 2015-07-25 NOTE — Progress Notes (Signed)
Patient ID: Isaac [name], male   DOB: 10/19/1955, 60 y.o.   MRN: [id number]   Reason for Appointment: Diabetes follow-up   History of Present Illness   Diagnosis: Type 2 DIABETES MELITUS, long-standing      He has been on various regimens of treatment in the past including mealtime insulin, Victoza and Januvia Previously he has had difficulty tolerating Victoza and also large doses of metformin He appears to have benefited  from using Invokana in 2014 with improved control and some weight loss Subsequently with using low dose Amaryl he was still getting some hypoglycemia; in 10/14  he was told to stop his Amaryl Because of relatively high readings  in 3/15 especially late evening and fasting he was restarted on Amaryl, half of a 2 mg tablet at suppertime  Recent history:  His A1c in December was significantly higher at 8.5% compared to 7.0 He had also started losing weight and not clear whether this was from his dietary changes or hyperglycemia However with his trying to be very consistent with his diet his blood sugars are looking excellent now He was told to take 300 mg of Invokana but he still taking a half tablet   His current blood sugar patterns, management and problems identified:   His fasting blood sugars are mostly just above 100 and fairly consistent  Blood sugars are also within the target range in the evenings fairly consistently  Has had only one notably low blood sugar of 66 with not eating much during the day, does take Amaryl at suppertime  He has done a few readings after his evening meal or bedtime and these are also excellent  Although his weight has gone back up since his last visit his blood sugars are overall much better  Still not able to exercise because of his post surgical pain in the left knee  Oral hypoglycemic drugs: Invokana 150mg , metformin 1 g twice a day, Amaryl 1 mg ACS         Side effects from medications: nausea with Victoza         Monitors blood glucose: 1-2 times a day.    Glucometer: One Touch.          Blood Glucose readings from meter download:   Mean values apply above for all meters except median for One Touch  PRE-MEAL Fasting Lunch Dinner Bedtime Overall  Glucose range:  102-125   107, 119   66, 87   97-135    Mean/median:  112     113    Hypoglycemia: Rarely at suppertime .          Meals: 2- 3 meals per day. Dinner may 8-9 pm Physical activity: exercise:  limited by knee joint pain          Dietician visit: Most recent:3-4 years ago          Complications: are: coronary artery disease     Wt Readings from Last 3 Encounters:  07/25/15 187 lb (84.823 kg)  06/19/15 189 lb (85.73 kg)  06/01/15 200 lb 3.2 oz (90.81 kg)    Lab Results  Component Value Date   HGBA1C 6.3 07/20/2015   HGBA1C 8.5* 04/23/2015   HGBA1C 7.0* 12/20/2014   Lab Results  Component Value Date   MICROALBUR 1.1 07/20/2015   LDLCALC 70 08/21/2014   CREATININE 0.80 07/20/2015     Lab on 07/20/2015  Component Date Value Ref Range Status  . Sodium 07/20/2015 135  135 - 145 mEq/L Final  . Potassium 07/20/2015 3.8  3.5 - 5.1 mEq/L Final  . Chloride 07/20/2015 101  96 - 112 mEq/L Final  . CO2 07/20/2015 25  19 - 32 mEq/L Final  . Glucose, Bld 07/20/2015 107* 70 - 99 mg/dL Final  . BUN 07/20/2015 17  6 - 23 mg/dL Final  . Creatinine, Ser 07/20/2015 0.80  0.40 - 1.50 mg/dL Final  . Total Bilirubin 07/20/2015 0.5  0.2 - 1.2 mg/dL Final  . Alkaline Phosphatase 07/20/2015 56  39 - 117 U/L Final  . AST 07/20/2015 20  0 - 37 U/L Final  . ALT 07/20/2015 31  0 - 53 U/L Final  . Total Protein 07/20/2015 6.7  6.0 - 8.3 g/dL Final  . Albumin 07/20/2015 4.2  3.5 - 5.2 g/dL Final  . Calcium 07/20/2015 9.2  8.4 - 10.5 mg/dL Final  . GFR 07/20/2015 104.80  >60.00 mL/min Final  . Hgb A1c MFr Bld 07/20/2015 6.3  4.6 - 6.5 % Final   Glycemic Control Guidelines for People with Diabetes:Non Diabetic:  <6%Goal of Therapy:  <7%Additional Action Suggested:  >8%   . Cholesterol 07/20/2015 156  0 - 200 mg/dL Final   ATP III Classification       Desirable:  < 200 mg/dL               Borderline High:  200 - 239 mg/dL          High:  > = 240 mg/dL  . Triglycerides 07/20/2015 296.0* 0.0 - 149.0 mg/dL Final   Normal:  <150 mg/dLBorderline High:  150 - 199 mg/dL  . HDL 07/20/2015 29.90* >39.00 mg/dL Final  . VLDL 07/20/2015 59.2* 0.0 - 40.0 mg/dL Final  . Total CHOL/HDL Ratio 07/20/2015 5   Final                  Men          Women1/2 Average Risk     3.4          3.3Average Risk          5.0          4.42X Average Risk          9.6          7.13X Average Risk          15.0          11.0                      . NonHDL 07/20/2015 125.75   Final   NOTE:  Non-HDL goal should be 30 mg/dL higher than patient's LDL goal (i.e. LDL goal of < 70 mg/dL, would have non-HDL goal of < 100 mg/dL)  . Microalb, Ur 07/20/2015 1.1  0.0 - 1.9 mg/dL Final  . Creatinine,U 07/20/2015 34.9   Final  . Microalb Creat Ratio 07/20/2015 3.2  0.0 - 30.0 mg/g Final  . Color, UA 07/20/2015 yellow   Final  . Clarity, UA 07/20/2015 clear   Final  . Glucose, UA 07/20/2015 2 or more   Final  . Bilirubin, UA 07/20/2015 small   Final  . Ketones, UA 07/20/2015 negative   Final  . Spec Grav, UA 07/20/2015 1.010   Final  . Blood, UA 07/20/2015 negative   Final  . pH, UA 07/20/2015 6.0   Final  . Protein, UA 07/20/2015 negative   Final  . Urobilinogen, UA 07/20/2015 0.2  Final  . Nitrite, UA 07/20/2015 negative   Final  . Leukocytes, UA 07/20/2015 Negative  Negative Final  . Direct LDL 07/20/2015 96.0   Final   Optimal:  <100 mg/dLNear or Above Optimal:  100-129 mg/dLBorderline High:  130-159 mg/dLHigh:  160-189 mg/dLVery High:  >190 mg/dL      Medication List       This list is accurate as of: 07/25/15  1:10 PM.  Always use your most recent med list.               amLODipine 10 MG tablet  Commonly known as:  NORVASC  Take 10 mg by mouth daily.  Hold for SBP < 110     canagliflozin 300 MG Tabs tablet  Commonly known as:  INVOKANA  Take 300 mg by mouth daily.     docusate sodium 100 MG capsule  Commonly known as:  COLACE  Take 1 capsule (100 mg total) by mouth 2 (two) times daily.     ELIQUIS 2.5 MG Tabs tablet  Generic drug:  apixaban  Take 2.5 mg by mouth 2 (two) times daily.     glimepiride 2 MG tablet  Commonly known as:  AMARYL  TAKE 1 BY MOUTH DAILY BEFORE SUPPER     HYDROmorphone 2 MG tablet  Commonly known as:  DILAUDID  Take 2 mg by mouth every 6 (six) hours as needed for severe pain.     isosorbide mononitrate 120 MG 24 hr tablet  Commonly known as:  IMDUR  Take 1 tablet (120 mg total) by mouth every morning.     metFORMIN 1000 MG tablet  Commonly known as:  GLUCOPHAGE  Take 1 tablet (1,000 mg total) by mouth 2 (two) times daily with a meal.     methocarbamol 750 MG tablet  Commonly known as:  ROBAXIN  TAKE ONE TABLET BY MOUTH EVERY SIX TO EIGHT HOURS AS NEEDED FOR SPASMS     metoprolol succinate 50 MG 24 hr tablet  Commonly known as:  TOPROL-XL  Take 50 mg by mouth daily. Take with or immediately following a meal. Hold for SBP <110 ID<60     nitroGLYCERIN 0.4 MG SL tablet  Commonly known as:  NITROSTAT  Place 1 tablet (0.4 mg total) under the tongue every 5 (five) minutes as needed for chest pain.     pantoprazole 40 MG tablet  Commonly known as:  PROTONIX  Take 1 tablet (40 mg total) by mouth every morning.     polyethylene glycol packet  Commonly known as:  MIRALAX / GLYCOLAX  Take 17 g by mouth daily.     rOPINIRole 1 MG tablet  Commonly known as:  REQUIP  1.5 tablets by mouth at bedtime for restless legs     rosuvastatin 10 MG tablet  Commonly known as:  CRESTOR        Allergies: No Known Allergies  Past Medical History  Diagnosis Date  . Hypertension   . Dyslipidemia     pt unable to tolerate niaspan  . GERD (gastroesophageal reflux disease)   . Diabetes mellitus      followed by Dr. Dwyane Dee  . History of non-ST elevation myocardial infarction (NSTEMI) 2005  . 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 History of angina CHRONIC -- CONTROLLED W/ IMDUR  . Peripheral neuropathy (HCC) RIGHT LEG NUMBNESS  . Coronary artery disease     followed by Dr. [name]; Lexiscan Myoview (10/14):  Low risk, EF 57%, small inf infarct  . Coronary vasospasm ([place]) PER DR ROSS NOTE 03-22-2012--  INTER. TIGHTNESS    PER PT PROBABLE FROM STRESS  . Depression     no rx  . Arthritis   . Bladder cancer [facility]) followed by dr Risa Grill  . Bladder cancer ([place])   . OSA on CPAP     Past Surgical History  Procedure Laterality Date  . Umbillical hernia repair  2004  . Elbow surgery  07-07-2003    RIGHT ELBOW ARTHROSCOPY W/ OPEN RECONSTRUCTION  . Shoulder surgery  2005    RIGHT ROTATOR CUFF REPAIR  . Cystoscopy with biopsy  04/07/2011    Procedure: CYSTOSCOPY WITH BIOPSY/ RIGHT URETEROSCOPY;  Surgeon: Bernestine Amass, MD;  Location: [facility];  Service: Urology;  Laterality: N/A;    cystoscopy, cystogram, biopsy and fulgeration  . Cysto/ resection bladder tumor/ left retrograde pyelogram/ right ureteroscopy  08-07-2010    TCC OF BLADDER   S/P DISTAL URETERECTOMY/ REIMPLANTATION  . Cysto/ cystogram/ ureteroscopy  02-21-2009  . Right distal ureterectomy w/ reimplantation  10-30-2008    TCC OF RIGHT DISTAL URETER  . Cysto/ right ureteroscopy/ bx ureteral tumor  10-11-2008  . Cysto/ bladder bx  09-13-2008;  08-13-2007; 04-19-2007; 06-01-2006  . Cysto/ bilateral retrograde pyelogram/ right ureteroscopic laser fulguration ureteral tumor  02-16-2008  . Rhinoplasty w/ modified cartilage graft  05-17-2007    INTERNAL NASAL VALVE COLLAPSE/ OSA/ SEPTAL PERFERATION  . Transurethral resection of bladder tumor  01-13-2007  . Appendectomy  08-03-2004  . Knee arthroscopy w/ debridement  02-08-2004    RIGHT KNEE  . Cardiac catheterization   x3  last one 06-17-2007    MILD NON-OBSTRUCTIVE CAD/ NORMAL LVF/ 30% LEFT RENAL ARTERY STENOSIS  . Cystoscopy with biopsy  12/29/2011    Procedure: CYSTOSCOPY WITH BIOPSY;  Surgeon: Bernestine Amass, MD;  Location: [facility];  Service: Urology;  Laterality: N/A;  [place]    . Cystoscopy w/ retrogrades  04/19/2012    Procedure: CYSTOSCOPY WITH RETROGRADE PYELOGRAM;  Surgeon: Bernestine Amass, MD;  Location: [facility];  Service: Urology;  Laterality: Bilateral;  30 MINS Cysto, Biopsy, possible TURBT, Bilateral retrograde pyelograms with Mitomycin C instilation Post op   . Transurethral resection of bladder tumor  04/19/2012    Procedure: TRANSURETHRAL RESECTION OF BLADDER TUMOR (TURBT);  Surgeon: Bernestine Amass, MD;  Location: [facility];  Service: Urology;  Laterality: N/A;  . Total knee arthroplasty Left 05/18/2015  . Total knee arthroplasty Left 05/18/2015    Procedure: TOTAL KNEE ARTHROPLASTY;  Surgeon: Earlie Server, MD;  Location: [place];  Service: Orthopedics;  Laterality: Left;    Family History  Problem Relation Age of Onset  . Coronary artery disease Father   . Colon cancer Neg Hx   . Stomach cancer Neg Hx   . Diabetes Maternal Grandmother   . Heart disease Paternal Grandfather     Social History:  reports that he quit smoking about 8 years ago. His smoking use included Cigarettes. He has a 10 pack-year smoking history. He has never used smokeless tobacco. He reports that he does not drink alcohol or use illicit drugs.  Review of Systems:  HYPERTENSION:   on treatment and is on amlodipine and metoprolol He is currently not on ACE inhibitor and has normal microalbumin  HYPERLIPIDEMIA: The lipid abnormality consists of elevated LDL and low HDL, previously controlled with Crestor  His triglycerides are high but he is not sure if he was fasting that morning LDL target is below 70   Lab Results  Component Value Date     CHOL 156 07/20/2015   HDL 29.90* 07/20/2015   LDLCALC 70 08/21/2014   LDLDIRECT 96.0 07/20/2015   TRIG 296.0* 07/20/2015   CHOLHDL 5 07/20/2015    He  has had abnormal liver functions since about 2012, apparently not related to Crestor His cardiologist had evaluated this with hepatitis studies previously and these were negative His liver function abnormality has been intermittent but now much better  Previous ultrasound shows fatty liver and apparently all other studies were negative from gastroenterologist  Lab Results  Component Value Date   ALT 31 07/20/2015        Examination:   BP 126/84 mmHg  Pulse 80  Temp(Src) 98.2 F (36.8 C)  Resp 16  Ht 5\' 10"  (1.778 m)  Wt 187 lb (84.823 kg)  BMI 26.83 kg/m2  SpO2 97%  Body mass index is 26.83 kg/(m^2).   He has swelling of the left knee with mornings  ASSESSMENT/ PLAN:   Diabetes type 2   The patient's diabetes control is significantly better with A1c 6.3 This is mostly related to his doing very well with his diet and also his weight loss over the last 3-4 months for various reasons He will continue his regimen unchanged Encouraged him to start exercising once his knee pain is better  Abnormal liver functions:  improved with weight loss and improved blood sugar,   HYPERLIPIDEMIA: He will go back to Crestor every other day because of his history of CAD and LDL of over 70  Ivania Teagarden 07/25/2015, 1:10 PM   Note: This office note was prepared with Estate agent. Any transcriptional errors that result from this process are unintentional.

## 2015-07-25 NOTE — Patient Instructions (Addendum)
Check blood sugars on waking up 3  times a week Also check blood sugars about 2 hours after a meal and do this after different meals by rotation  Recommended blood sugar levels on waking up is 90-130 and about 2 hours after meal is 130-160  Please bring your blood sugar monitor to each visit, thank you  Crestor every 2 days  Lab on 07/20/2015  Component Date Value Ref Range Status  . Sodium 07/20/2015 135  135 - 145 mEq/L Final  . Potassium 07/20/2015 3.8  3.5 - 5.1 mEq/L Final  . Chloride 07/20/2015 101  96 - 112 mEq/L Final  . CO2 07/20/2015 25  19 - 32 mEq/L Final  . Glucose, Bld 07/20/2015 107* 70 - 99 mg/dL Final  . BUN 07/20/2015 17  6 - 23 mg/dL Final  . Creatinine, Ser 07/20/2015 0.80  0.40 - 1.50 mg/dL Final  . Total Bilirubin 07/20/2015 0.5  0.2 - 1.2 mg/dL Final  . Alkaline Phosphatase 07/20/2015 56  39 - 117 U/L Final  . AST 07/20/2015 20  0 - 37 U/L Final  . ALT 07/20/2015 31  0 - 53 U/L Final  . Total Protein 07/20/2015 6.7  6.0 - 8.3 g/dL Final  . Albumin 07/20/2015 4.2  3.5 - 5.2 g/dL Final  . Calcium 07/20/2015 9.2  8.4 - 10.5 mg/dL Final  . GFR 07/20/2015 104.80  >60.00 mL/min Final  . Hgb A1c MFr Bld 07/20/2015 6.3  4.6 - 6.5 % Final   Glycemic Control Guidelines for People with Diabetes:Non Diabetic:  <6%Goal of Therapy: <7%Additional Action Suggested:  >8%   . Cholesterol 07/20/2015 156  0 - 200 mg/dL Final   ATP III Classification       Desirable:  < 200 mg/dL               Borderline High:  200 - 239 mg/dL          High:  > = 240 mg/dL  . Triglycerides 07/20/2015 296.0* 0.0 - 149.0 mg/dL Final   Normal:  <150 mg/dLBorderline High:  150 - 199 mg/dL  . HDL 07/20/2015 29.90* >39.00 mg/dL Final  . VLDL 07/20/2015 59.2* 0.0 - 40.0 mg/dL Final  . Total CHOL/HDL Ratio 07/20/2015 5   Final                  Men          Women1/2 Average Risk     3.4          3.3Average Risk          5.0          4.42X Average Risk          9.6          7.13X Average Risk          15.0           11.0                      . NonHDL 07/20/2015 125.75   Final   NOTE:  Non-HDL goal should be 30 mg/dL higher than patient's LDL goal (i.e. LDL goal of < 70 mg/dL, would have non-HDL goal of < 100 mg/dL)  . Microalb, Ur 07/20/2015 1.1  0.0 - 1.9 mg/dL Final  . Creatinine,U 07/20/2015 34.9   Final  . Microalb Creat Ratio 07/20/2015 3.2  0.0 - 30.0 mg/g Final  . Color, UA 07/20/2015 yellow   Final  .  Clarity, UA 07/20/2015 clear   Final  . Glucose, UA 07/20/2015 2 or more   Final  . Bilirubin, UA 07/20/2015 small   Final  . Ketones, UA 07/20/2015 negative   Final  . Spec Grav, UA 07/20/2015 1.010   Final  . Blood, UA 07/20/2015 negative   Final  . pH, UA 07/20/2015 6.0   Final  . Protein, UA 07/20/2015 negative   Final  . Urobilinogen, UA 07/20/2015 0.2   Final  . Nitrite, UA 07/20/2015 negative   Final  . Leukocytes, UA 07/20/2015 Negative  Negative Final  . Direct LDL 07/20/2015 96.0   Final   Optimal:  <100 mg/dLNear or Above Optimal:  100-129 mg/dLBorderline High:  130-159 mg/dLHigh:  160-189 mg/dLVery High:  >190 mg/dL

## 2015-07-31 DIAGNOSIS — M1712 Unilateral primary osteoarthritis, left knee: Secondary | ICD-10-CM | POA: Diagnosis not present

## 2015-08-02 DIAGNOSIS — M1712 Unilateral primary osteoarthritis, left knee: Secondary | ICD-10-CM | POA: Diagnosis not present

## 2015-08-05 NOTE — Patient Instructions (Signed)
Once recovered from knee surgery, please get on strict diet and exercise program. Continue to be followed by endocrinologist for diabetes mellitus. Return in one year or as needed.

## 2015-08-06 DIAGNOSIS — M1712 Unilateral primary osteoarthritis, left knee: Secondary | ICD-10-CM | POA: Diagnosis not present

## 2015-08-08 DIAGNOSIS — M1712 Unilateral primary osteoarthritis, left knee: Secondary | ICD-10-CM | POA: Diagnosis not present

## 2015-08-09 DIAGNOSIS — Z96652 Presence of left artificial knee joint: Secondary | ICD-10-CM | POA: Diagnosis not present

## 2015-08-14 DIAGNOSIS — M1712 Unilateral primary osteoarthritis, left knee: Secondary | ICD-10-CM | POA: Diagnosis not present

## 2015-08-16 DIAGNOSIS — M1712 Unilateral primary osteoarthritis, left knee: Secondary | ICD-10-CM | POA: Diagnosis not present

## 2015-08-21 DIAGNOSIS — M1712 Unilateral primary osteoarthritis, left knee: Secondary | ICD-10-CM | POA: Diagnosis not present

## 2015-08-23 ENCOUNTER — Encounter: Payer: Self-pay | Admitting: *Deleted

## 2015-08-23 DIAGNOSIS — E119 Type 2 diabetes mellitus without complications: Secondary | ICD-10-CM | POA: Diagnosis not present

## 2015-08-23 DIAGNOSIS — H524 Presbyopia: Secondary | ICD-10-CM | POA: Diagnosis not present

## 2015-08-23 DIAGNOSIS — H25013 Cortical age-related cataract, bilateral: Secondary | ICD-10-CM | POA: Diagnosis not present

## 2015-08-23 DIAGNOSIS — H43393 Other vitreous opacities, bilateral: Secondary | ICD-10-CM | POA: Diagnosis not present

## 2015-08-23 DIAGNOSIS — M1712 Unilateral primary osteoarthritis, left knee: Secondary | ICD-10-CM | POA: Diagnosis not present

## 2015-08-23 LAB — HM DIABETES EYE EXAM

## 2015-09-06 DIAGNOSIS — Z96652 Presence of left artificial knee joint: Secondary | ICD-10-CM | POA: Diagnosis not present

## 2015-09-27 DIAGNOSIS — G4733 Obstructive sleep apnea (adult) (pediatric): Secondary | ICD-10-CM | POA: Diagnosis not present

## 2015-10-02 DIAGNOSIS — E119 Type 2 diabetes mellitus without complications: Secondary | ICD-10-CM | POA: Diagnosis not present

## 2015-10-05 DIAGNOSIS — Z8551 Personal history of malignant neoplasm of bladder: Secondary | ICD-10-CM | POA: Diagnosis not present

## 2015-10-05 DIAGNOSIS — N400 Enlarged prostate without lower urinary tract symptoms: Secondary | ICD-10-CM | POA: Diagnosis not present

## 2015-10-05 DIAGNOSIS — Z0000 Encounter for general adult medical examination without abnormal findings: Secondary | ICD-10-CM | POA: Diagnosis not present

## 2015-10-10 DIAGNOSIS — Z8551 Personal history of malignant neoplasm of bladder: Secondary | ICD-10-CM | POA: Diagnosis not present

## 2015-10-10 DIAGNOSIS — Z0000 Encounter for general adult medical examination without abnormal findings: Secondary | ICD-10-CM | POA: Diagnosis not present

## 2015-10-10 DIAGNOSIS — N3000 Acute cystitis without hematuria: Secondary | ICD-10-CM | POA: Diagnosis not present

## 2015-10-17 DIAGNOSIS — N3000 Acute cystitis without hematuria: Secondary | ICD-10-CM | POA: Diagnosis not present

## 2015-10-22 ENCOUNTER — Other Ambulatory Visit: Payer: Medicare Other

## 2015-10-22 ENCOUNTER — Other Ambulatory Visit (INDEPENDENT_AMBULATORY_CARE_PROVIDER_SITE_OTHER): Payer: Medicare Other

## 2015-10-22 DIAGNOSIS — M1000 Idiopathic gout, unspecified site: Secondary | ICD-10-CM

## 2015-10-22 DIAGNOSIS — E119 Type 2 diabetes mellitus without complications: Secondary | ICD-10-CM

## 2015-10-22 DIAGNOSIS — M109 Gout, unspecified: Secondary | ICD-10-CM | POA: Insufficient documentation

## 2015-10-22 LAB — COMPREHENSIVE METABOLIC PANEL
ALT: 34 U/L (ref 0–53)
AST: 19 U/L (ref 0–37)
Albumin: 4 g/dL (ref 3.5–5.2)
Alkaline Phosphatase: 41 U/L (ref 39–117)
BUN: 16 mg/dL (ref 6–23)
CHLORIDE: 101 meq/L (ref 96–112)
CO2: 27 meq/L (ref 19–32)
CREATININE: 0.97 mg/dL (ref 0.40–1.50)
Calcium: 9.6 mg/dL (ref 8.4–10.5)
GFR: 83.83 mL/min (ref >60.00)
Glucose, Bld: 99 mg/dL (ref 70–99)
POTASSIUM: 4.7 meq/L (ref 3.5–5.1)
Sodium: 136 mEq/L (ref 135–145)
Total Bilirubin: 0.4 mg/dL (ref 0.2–1.2)
Total Protein: 6.7 g/dL (ref 6.0–8.3)

## 2015-10-22 LAB — LIPID PANEL
CHOL/HDL RATIO: 3
Cholesterol: 128 mg/dL (ref 0–200)
HDL: 38.1 mg/dL — ABNORMAL LOW (ref >39.00)
LDL CALC: 58 mg/dL (ref 0–99)
NonHDL: 89.69
Triglycerides: 156 mg/dL — ABNORMAL HIGH (ref 0.0–149.0)
VLDL: 31.2 mg/dL (ref 0.0–40.0)

## 2015-10-22 LAB — URIC ACID: URIC ACID, SERUM: 4.5 mg/dL (ref 4.0–7.8)

## 2015-10-22 LAB — HEMOGLOBIN A1C: HEMOGLOBIN A1C: 6.8 % — AB (ref 4.6–6.5)

## 2015-10-24 DIAGNOSIS — D485 Neoplasm of uncertain behavior of skin: Secondary | ICD-10-CM | POA: Diagnosis not present

## 2015-10-24 DIAGNOSIS — N3000 Acute cystitis without hematuria: Secondary | ICD-10-CM | POA: Diagnosis not present

## 2015-10-24 DIAGNOSIS — Z85828 Personal history of other malignant neoplasm of skin: Secondary | ICD-10-CM | POA: Diagnosis not present

## 2015-10-24 DIAGNOSIS — L821 Other seborrheic keratosis: Secondary | ICD-10-CM | POA: Diagnosis not present

## 2015-10-24 DIAGNOSIS — D2271 Melanocytic nevi of right lower limb, including hip: Secondary | ICD-10-CM | POA: Diagnosis not present

## 2015-10-24 DIAGNOSIS — D225 Melanocytic nevi of trunk: Secondary | ICD-10-CM | POA: Diagnosis not present

## 2015-10-24 DIAGNOSIS — N401 Enlarged prostate with lower urinary tract symptoms: Secondary | ICD-10-CM | POA: Diagnosis not present

## 2015-10-24 DIAGNOSIS — L570 Actinic keratosis: Secondary | ICD-10-CM | POA: Diagnosis not present

## 2015-10-24 DIAGNOSIS — D1801 Hemangioma of skin and subcutaneous tissue: Secondary | ICD-10-CM | POA: Diagnosis not present

## 2015-10-24 DIAGNOSIS — C44519 Basal cell carcinoma of skin of other part of trunk: Secondary | ICD-10-CM | POA: Diagnosis not present

## 2015-10-24 DIAGNOSIS — R350 Frequency of micturition: Secondary | ICD-10-CM | POA: Diagnosis not present

## 2015-10-25 ENCOUNTER — Ambulatory Visit (INDEPENDENT_AMBULATORY_CARE_PROVIDER_SITE_OTHER): Payer: Medicare Other | Admitting: Endocrinology

## 2015-10-25 ENCOUNTER — Encounter: Payer: Self-pay | Admitting: Endocrinology

## 2015-10-25 VITALS — BP 106/60 | HR 89 | Ht 70.0 in | Wt 195.0 lb

## 2015-10-25 DIAGNOSIS — E1165 Type 2 diabetes mellitus with hyperglycemia: Secondary | ICD-10-CM

## 2015-10-25 DIAGNOSIS — K760 Fatty (change of) liver, not elsewhere classified: Secondary | ICD-10-CM

## 2015-10-25 DIAGNOSIS — M25571 Pain in right ankle and joints of right foot: Secondary | ICD-10-CM

## 2015-10-25 DIAGNOSIS — E785 Hyperlipidemia, unspecified: Secondary | ICD-10-CM | POA: Diagnosis not present

## 2015-10-25 NOTE — Progress Notes (Signed)
Patient ID: Isaac [name], male   DOB: 13-Jun-1955, 60 y.o.   MRN: [id number]   Reason for Appointment:  follow-up   History of Present Illness   Diagnosis: Type 2 DIABETES MELITUS, long-standing      He has been on various regimens of treatment in the past including mealtime insulin, Victoza and Januvia Previously he has had difficulty tolerating Victoza and also large doses of metformin He appears to have benefited  from using [place] in 2014 with improved control and some weight loss Subsequently with using low dose Amaryl he was still getting some hypoglycemia; in 10/14  he was told to stop his Amaryl Because of relatively high readings  in 3/15 especially late evening and fasting he was restarted on Amaryl, half of a 2 mg tablet at suppertime  Recent history: Oral hypoglycemic drugs: Invokana 150mg , metformin 1 g twice a day, Amaryl 1 mg ACS    His A1c in December was significantly higher at 8.5% which improved with his modifying his diet and losing weight  His A1c is slightly higher at 6.8 compared 6.3 and his weight has gone up slightly again He was told to take 300 mg of Invokana but he still taking a half tablet the cause of the cost   His current blood sugar patterns, management and problems identified:   His fasting blood sugars are generally just above 100 and fairly consistent  Blood sugars are also near normal before evening meal  He does try to check a few readings after supper and these are also excellent including some at bedtime  Has had only one low blood sugar of 65  Highest blood sugar 294 after going off his diet one evening  Although he was starting to do some walking he has not done any recently because of UTI after surgical procedure         Side effects from medications: nausea with Victoza         Monitors blood glucose: 1-2 times a day.    Glucometer: One Touch.          Blood Glucose readings from meter download:   Mean values  apply above for all meters except median for One Touch  PRE-MEAL Fasting Lunch Dinner Bedtime Overall  Glucose range: 86-161   78-147  103-294    Mean/median: 114   108  111  112  .          Meals: 2- 3 meals per day. Dinner 8-9 pm  Physical activity: exercise:  limited by knee joint And ankle pain          Dietician visit: Most recent:3-4 years ago          Complications: are: coronary artery disease     Wt Readings from Last 3 Encounters:  10/25/15 195 lb (88.451 kg)  07/25/15 187 lb (84.823 kg)  06/19/15 189 lb (85.73 kg)    Lab Results  Component Value Date   HGBA1C 6.8* 10/22/2015   HGBA1C 6.3 07/20/2015   HGBA1C 8.5* 04/23/2015   Lab Results  Component Value Date   MICROALBUR 1.1 07/20/2015   LDLCALC 58 10/22/2015   CREATININE 0.97 10/22/2015     Lab on 10/22/2015  Component Date Value Ref Range Status  . Hgb A1c MFr Bld 10/22/2015 6.8* 4.6 - 6.5 % Final   Glycemic Control Guidelines for People with Diabetes:Non Diabetic:  <6%Goal of Therapy: <7%Additional Action Suggested:  >8%   . Sodium 10/22/2015 136  135 - 145 mEq/L Final  . Potassium 10/22/2015 4.7  3.5 - 5.1 mEq/L Final  . Chloride 10/22/2015 101  96 - 112 mEq/L Final  . CO2 10/22/2015 27  19 - 32 mEq/L Final  . Glucose, Bld 10/22/2015 99  70 - 99 mg/dL Final  . BUN 10/22/2015 16  6 - 23 mg/dL Final  . Creatinine, Ser 10/22/2015 0.97  0.40 - 1.50 mg/dL Final  . Total Bilirubin 10/22/2015 0.4  0.2 - 1.2 mg/dL Final  . Alkaline Phosphatase 10/22/2015 41  39 - 117 U/L Final  . AST 10/22/2015 19  0 - 37 U/L Final  . ALT 10/22/2015 34  0 - 53 U/L Final  . Total Protein 10/22/2015 6.7  6.0 - 8.3 g/dL Final  . Albumin 10/22/2015 4.0  3.5 - 5.2 g/dL Final  . Calcium 10/22/2015 9.6  8.4 - 10.5 mg/dL Final  . GFR 10/22/2015 83.83  >60.00 mL/min Final  . Cholesterol 10/22/2015 128  0 - 200 mg/dL Final   ATP III Classification       Desirable:  < 200 mg/dL               Borderline High:  200 - 239 mg/dL           High:  > = 240 mg/dL  . Triglycerides 10/22/2015 156.0* 0.0 - 149.0 mg/dL Final   Normal:  <150 mg/dLBorderline High:  150 - 199 mg/dL  . HDL 10/22/2015 38.10* >39.00 mg/dL Final  . VLDL 10/22/2015 31.2  0.0 - 40.0 mg/dL Final  . LDL Cholesterol 10/22/2015 58  0 - 99 mg/dL Final  . Total CHOL/HDL Ratio 10/22/2015 3   Final                  Men          Women1/2 Average Risk     3.4          3.3Average Risk          5.0          4.42X Average Risk          9.6          7.13X Average Risk          15.0          11.0                      . NonHDL 10/22/2015 89.69   Final   NOTE:  Non-HDL goal should be 30 mg/dL higher than patient's LDL goal (i.e. LDL goal of < 70 mg/dL, would have non-HDL goal of < 100 mg/dL)  . Uric Acid, Serum 10/22/2015 4.5  4.0 - 7.8 mg/dL Final      Medication List       This list is accurate as of: 10/25/15 10:44 AM.  Always use your most recent med list.               amLODipine 10 MG tablet  Commonly known as:  NORVASC  Take 10 mg by mouth daily. Hold for SBP < 110     canagliflozin 300 MG Tabs tablet  Commonly known as:  INVOKANA  Take 300 mg by mouth daily.     ciprofloxacin 500 MG tablet  Commonly known as:  CIPRO  Take 500 mg by mouth 2 (two) times daily.     docusate sodium 100 MG capsule  Commonly known as:  COLACE  Take 1  capsule (100 mg total) by mouth 2 (two) times daily.     ELIQUIS 2.5 MG Tabs tablet  Generic drug:  apixaban  Take 2.5 mg by mouth 2 (two) times daily. Reported on 10/25/2015     glimepiride 2 MG tablet  Commonly known as:  AMARYL  TAKE 1 BY MOUTH DAILY BEFORE SUPPER     HYDROmorphone 2 MG tablet  Commonly known as:  DILAUDID  Take 2 mg by mouth every 6 (six) hours as needed for severe pain. Reported on 10/25/2015     isosorbide mononitrate 120 MG 24 hr tablet  Commonly known as:  IMDUR  Take 1 tablet (120 mg total) by mouth every morning.     metFORMIN 1000 MG tablet  Commonly known as:  GLUCOPHAGE  Take 1 tablet  (1,000 mg total) by mouth 2 (two) times daily with a meal.     methocarbamol 750 MG tablet  Commonly known as:  ROBAXIN  TAKE ONE TABLET BY MOUTH EVERY SIX TO EIGHT HOURS AS NEEDED FOR SPASMS     metoprolol succinate 50 MG 24 hr tablet  Commonly known as:  TOPROL-XL  Take 50 mg by mouth daily. Take with or immediately following a meal. Hold for SBP <110 ID<60     nitroGLYCERIN 0.4 MG SL tablet  Commonly known as:  NITROSTAT  Place 1 tablet (0.4 mg total) under the tongue every 5 (five) minutes as needed for chest pain.     pantoprazole 40 MG tablet  Commonly known as:  PROTONIX  Take 1 tablet (40 mg total) by mouth every morning.     polyethylene glycol packet  Commonly known as:  MIRALAX / GLYCOLAX  Take 17 g by mouth daily. Reported on 10/25/2015     rOPINIRole 1 MG tablet  Commonly known as:  REQUIP  1.5 tablets by mouth at bedtime for restless legs     rosuvastatin 10 MG tablet  Commonly known as:  CRESTOR     tamsulosin 0.4 MG Caps capsule  Commonly known as:  FLOMAX  Take 0.4 mg by mouth.        Allergies: No Known Allergies  Past Medical History  Diagnosis Date  . Hypertension   . Dyslipidemia     pt unable to tolerate niaspan  . GERD (gastroesophageal reflux disease)   . Diabetes mellitus     followed by Dr. Dwyane Dee  . History of non-ST elevation myocardial infarction (NSTEMI) 2005  . 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 History of angina CHRONIC -- CONTROLLED W/ IMDUR  . Peripheral neuropathy (HCC) RIGHT LEG NUMBNESS  . Coronary artery disease     followed by Dr. [name]; Lexiscan Myoview (10/14):  Low risk, EF 57%, small inf infarct  . Coronary vasospasm ([place]) PER DR ROSS NOTE 03-22-2012--  INTER. TIGHTNESS    PER PT PROBABLE FROM STRESS  . Depression     no rx  . Arthritis   . Bladder cancer [facility]) followed by dr Risa Grill  . Bladder cancer ([place])   . OSA on CPAP     Past Surgical History  Procedure  Laterality Date  . Umbillical hernia repair  2004  . Elbow surgery  07-07-2003    RIGHT ELBOW ARTHROSCOPY W/ OPEN RECONSTRUCTION  . Shoulder surgery  2005    RIGHT ROTATOR CUFF REPAIR  . Cystoscopy with biopsy  04/07/2011    Procedure: CYSTOSCOPY WITH BIOPSY/ RIGHT URETEROSCOPY;  Surgeon: Bernestine Amass,  MD;  Location: [place];  Service: Urology;  Laterality: N/A;    cystoscopy, cystogram, biopsy and fulgeration  . Cysto/ resection bladder tumor/ left retrograde pyelogram/ right ureteroscopy  08-07-2010    TCC OF BLADDER   S/P DISTAL URETERECTOMY/ REIMPLANTATION  . Cysto/ cystogram/ ureteroscopy  02-21-2009  . Right distal ureterectomy w/ reimplantation  10-30-2008    TCC OF RIGHT DISTAL URETER  . Cysto/ right ureteroscopy/ bx ureteral tumor  10-11-2008  . Cysto/ bladder bx  09-13-2008;  08-13-2007; 04-19-2007; 06-01-2006  . Cysto/ bilateral retrograde pyelogram/ right ureteroscopic laser fulguration ureteral tumor  02-16-2008  . Rhinoplasty w/ modified cartilage graft  05-17-2007    INTERNAL NASAL VALVE COLLAPSE/ OSA/ SEPTAL PERFERATION  . Transurethral resection of bladder tumor  01-13-2007  . Appendectomy  08-03-2004  . Knee arthroscopy w/ debridement  02-08-2004    RIGHT KNEE  . Cardiac catheterization  x3  last one 06-17-2007    MILD NON-OBSTRUCTIVE CAD/ NORMAL LVF/ 30% LEFT RENAL ARTERY STENOSIS  . Cystoscopy with biopsy  12/29/2011    Procedure: CYSTOSCOPY WITH BIOPSY;  Surgeon: Bernestine Amass, MD;  Location[facility];  Service: Urology;  Laterality: N/A;  [place]    . Cystoscopy w/ retrogrades  04/19/2012    Procedure: CYSTOSCOPY WITH RETROGRADE PYELOGRAM;  Surgeon: Bernestine Amass, MD;  Location: [facility];  Service: Urology;  Laterality: Bilateral;  30 MINS Cysto, Biopsy, possible TURBT, Bilateral retrograde pyelograms with Mitomycin C instilation Post op   . Transurethral resection of bladder tumor   04/19/2012    Procedure: TRANSURETHRAL RESECTION OF BLADDER TUMOR (TURBT);  Surgeon: Bernestine Amass, MD;  Location: [facility];  Service: Urology;  Laterality: N/A;  . Total knee arthroplasty Left 05/18/2015  . Total knee arthroplasty Left 05/18/2015    Procedure: TOTAL KNEE ARTHROPLASTY;  Surgeon: Earlie Server, MD;  Location: [place];  Service: Orthopedics;  Laterality: Left;    Family History  Problem Relation Age of Onset  . Coronary artery disease Father   . Colon cancer Neg Hx   . Stomach cancer Neg Hx   . Diabetes Maternal Grandmother   . Heart disease Paternal Grandfather     Social History:  reports that he quit smoking about 8 years ago. His smoking use included Cigarettes. He has a 10 pack-year smoking history. He has never used smokeless tobacco. He reports that he does not drink alcohol or use illicit drugs.  Review of Systems:  He is asking about evaluation of his right ankle pain. He says when he tries to walk and sometimes when lying down he has been in the right ankle and proximal foot area without any injury.  Asking about testing for uric acid which was normal  HYPERTENSION:   on treatment and is on amlodipine and metoprolol He is currently not on ACE inhibitor and has normal microalbumin  HYPERLIPIDEMIA: The lipid abnormality consists of elevated LDL and low HDL,  controlled with Crestor  His triglycerides are near normal Since Crestor was increased to every other day on the last visit his LDL is below 70 again LDL target is below 70   Lab Results  Component Value Date   CHOL 128 10/22/2015   HDL 38.10* 10/22/2015   LDLCALC 58 10/22/2015   LDLDIRECT 96.0 07/20/2015   TRIG 156.0* 10/22/2015   CHOLHDL 3 10/22/2015    He  has had abnormal liver functions since about 2012, apparently not  related to Crestor His cardiologist had evaluated this with hepatitis studies previously and these were negative His liver function abnormality has been  intermittent but now much better  Previous ultrasound shows fatty liver and apparently all other studies were negative from gastroenterologist  Lab Results  Component Value Date   ALT 34 10/22/2015        Examination:   BP 106/60 mmHg  Pulse 89  Ht 5\' 10"  (1.778 m)  Wt 195 lb (88.451 kg)  BMI 27.98 kg/m2  SpO2 95%  Body mass index is 27.98 kg/(m^2).    No swelling, redness, tenderness of the right ankle or foot No edema  ASSESSMENT/ PLAN:   Diabetes type 2 With BMI 28 See history of present illness for detailed discussion of current diabetes management, blood sugar patterns and problems identified His blood sugars are excellent at home although not clear why his A1c is relatively higher at 6.8 He did have some intercurrent illness over the last 2-4 weeks and sporadically has high readings after meals or even morning He is not able to exercise recently because of various issues He is still trying to watch his diet but has had slight increase in weight He will continue his medications unchanged including Invokana with continued normal renal functions Although he has had a UTI this is unlikely to be related to taking Invokana and was supposed procedure  Abnormal liver functions:  Still normal with weight loss and improved blood sugar,   Right ankle/foot pain: Does not appear to have any inflammation and will need to discuss with his orthopedic surgeon  HYPERLIPIDEMIA:   better controlled with every other day Crestor   total visit time to evaluate multiple problems, counseling and labs = 25 minutes  Jonus Coble 10/25/2015, 10:44 AM   Note: This office note was prepared with Estate agent. Any transcriptional errors that result from this process are unintentional.

## 2015-11-06 DIAGNOSIS — Z96652 Presence of left artificial knee joint: Secondary | ICD-10-CM | POA: Diagnosis not present

## 2015-11-28 ENCOUNTER — Telehealth: Payer: Self-pay | Admitting: Endocrinology

## 2015-11-28 ENCOUNTER — Telehealth: Payer: Self-pay | Admitting: Internal Medicine

## 2015-11-28 MED ORDER — CANAGLIFLOZIN 300 MG PO TABS: 300.0000 mg | ORAL_TABLET | Freq: Every day | ORAL | Status: DC

## 2015-11-28 MED ORDER — GLIMEPIRIDE 2 MG PO TABS: ORAL_TABLET | ORAL | Status: AC

## 2015-11-28 NOTE — Telephone Encounter (Signed)
PT needs Glimepiride, Invokana, and Ropinirole sent into [place]

## 2015-11-28 NOTE — Telephone Encounter (Signed)
I contacted the pt and advised per Dr. Dwyane Dee to request the Requip rx from his pcp. Pt advised invokana and glimepiride have been submitted.

## 2015-11-28 NOTE — Telephone Encounter (Signed)
See below and please advise if ok to refill Requip. This medication is listed under a historical provider. Thanks!

## 2015-11-28 NOTE — Telephone Encounter (Signed)
By PCP.

## 2015-11-28 NOTE — Telephone Encounter (Signed)
°*  STAT* If patient is at the pharmacy, call can be transferred to refill team.   1. Which medications need to be refilled? (please list name of each medication and dose if known) Amlodipine,Metoprolol,Nitroglycerin and Rosuvastatin-need new prescriptions 2. Which pharmacy/location (including street and city if local pharmacy) is medication to be sent to?[facility]-Fax#-[phone]  3. Do they need a 30 day or 90 day supply?90 days and refills

## 2015-11-29 ENCOUNTER — Other Ambulatory Visit: Payer: Self-pay

## 2015-11-29 ENCOUNTER — Telehealth: Payer: Self-pay | Admitting: Internal Medicine

## 2015-11-29 MED ORDER — ROSUVASTATIN CALCIUM 10 MG PO TABS: 10.0000 mg | ORAL_TABLET | Freq: Every day | ORAL | Status: DC

## 2015-11-29 MED ORDER — AMLODIPINE BESYLATE 10 MG PO TABS: 10.0000 mg | ORAL_TABLET | Freq: Every day | ORAL | Status: DC

## 2015-11-29 MED ORDER — NITROGLYCERIN 0.4 MG SL SUBL: 0.4000 mg | SUBLINGUAL_TABLET | SUBLINGUAL | Status: DC | PRN

## 2015-11-29 MED ORDER — METOPROLOL SUCCINATE ER 50 MG PO TB24: 50.0000 mg | ORAL_TABLET | Freq: Every day | ORAL | Status: DC

## 2015-11-29 NOTE — Telephone Encounter (Signed)
Date/Time Report Action User    02/15/2015 10:42 AM After Visit Summary Printed Rodman Key, RN      Patient Instructions     Your physician recommends that you continue on your current medications as directed. Please refer to the Current Medication list given to you today. Your physician wants you to follow-up in: [place]. You will receive a reminder letter in the mail two months in advance. If you don't receive a letter, please call our office to schedule the follow-up appointment.   Per Dr. Harrington Challenger last office visit pt should follow up in a year. As of 11/29/15 no follow up appt has not been scheduled. Will refill medication for 90 days. Pt will be instructed to schedule 1 yr follow up appt to receive further refills.

## 2015-11-29 NOTE — Telephone Encounter (Signed)
APPEAL INITIATED   WAITING ON PEER REVIEW  .Adonis Housekeeper

## 2015-11-29 NOTE — Telephone Encounter (Signed)
Isaac [name] is needing to get a Tier exceptional request for his medications , Metoprolol tartrate, Ropinirole, and Isosorbide/Mono Nitrate. All three are in the second attempt to get this request after the third attempt it denied for no Clinical information and then a appeal will have to be done . Please call   Thanks

## 2015-11-30 ENCOUNTER — Telehealth: Payer: Self-pay | Admitting: Endocrinology

## 2015-11-30 NOTE — Telephone Encounter (Signed)
[employer] auth calling for clnical info for pt please call [place]

## 2015-12-03 ENCOUNTER — Telehealth: Payer: Self-pay | Admitting: *Deleted

## 2015-12-03 ENCOUNTER — Telehealth: Payer: Self-pay | Admitting: Endocrinology

## 2015-12-03 ENCOUNTER — Telehealth: Payer: Self-pay

## 2015-12-03 ENCOUNTER — Telehealth: Payer: Self-pay | Admitting: Internal Medicine

## 2015-12-03 NOTE — Telephone Encounter (Signed)
Isaac [name] w/[employer] needs the diagnosis for the Ropinirole, as well as any medications the patient has tried and failed.

## 2015-12-03 NOTE — Telephone Encounter (Signed)
Tier exceptions for Isosorbide and Metoprolol faxed to [facility].

## 2015-12-03 NOTE — Telephone Encounter (Signed)
Colletta Maryland from [facility] sent a request for medication ROPINIRole (REQUIP) 1 MG tablet, they haven't heard anything back from [place] therefore it was denied, you will have to appeal, you can't put in the same request for the same med for 60 days. Please advise  [phone]

## 2015-12-03 NOTE — Telephone Encounter (Signed)
Attempted to call [employer] to give diagnosis for medication; no answer. Will try again later.

## 2015-12-05 ENCOUNTER — Telehealth: Payer: Self-pay | Admitting: Internal Medicine

## 2015-12-05 NOTE — Telephone Encounter (Signed)
Left message for patient to contact [facility] @ [phone] to make an appointment regarding his restless leg/sleep apnea.  Advised that he had not been seen (that we could tell) since 2014 and he would need a follow up with their office and need to be followed by them for this medication.    Patient instructed to call us back if he has any further questions.

## 2015-12-05 NOTE — Telephone Encounter (Signed)
Isaac Burkitt Reiland, LPN      [id number] AM  Note    Tier exceptions for Isosorbide and Metoprolol faxed to [facility].

## 2015-12-05 NOTE — Telephone Encounter (Signed)
Patient is calling to see if you will write him a Rx for his Requip 1mg ?  He was having it filled by Dr. Gwenette Greet.  Dr. Dwyane Dee told him that he needed to have you fill it for him.  So, he wants to know if you will fill this for him?  He saw you for his CPE 06/19/15.    Pharmacy:  [facility] (they go through [employer] order.  They switched in 06/2015).  Fax [phone]

## 2015-12-05 NOTE — Telephone Encounter (Signed)
Dr. Gwenette Greet was managing his sleep apnea and restless leg syndrome. I do not see an appointment with Pulmonology since 2014. Patient needs to contact pulmonology department for follow-up of both sleep apnea and restless leg syndrome. He will likely need some adjustment with sleep apnea apparatus and new prescription for supplies as well .

## 2015-12-05 NOTE — Telephone Encounter (Signed)
Called patient and informed refills of Requip would need to be by PCP.  Was being followed by Dr. Gwenette Greet for sleep and he had been prescibing it.       He verbalizes understanding that he will need to discuss with PCP and appreciates the call back.

## 2015-12-20 DIAGNOSIS — R310 Gross hematuria: Secondary | ICD-10-CM | POA: Diagnosis not present

## 2015-12-27 DIAGNOSIS — R310 Gross hematuria: Secondary | ICD-10-CM | POA: Diagnosis not present

## 2015-12-27 DIAGNOSIS — R319 Hematuria, unspecified: Secondary | ICD-10-CM | POA: Diagnosis not present

## 2016-01-02 DIAGNOSIS — E119 Type 2 diabetes mellitus without complications: Secondary | ICD-10-CM | POA: Diagnosis not present

## 2016-01-04 DIAGNOSIS — Z85828 Personal history of other malignant neoplasm of skin: Secondary | ICD-10-CM | POA: Diagnosis not present

## 2016-01-04 DIAGNOSIS — L910 Hypertrophic scar: Secondary | ICD-10-CM | POA: Diagnosis not present

## 2016-01-04 DIAGNOSIS — L918 Other hypertrophic disorders of the skin: Secondary | ICD-10-CM | POA: Diagnosis not present

## 2016-01-04 DIAGNOSIS — L821 Other seborrheic keratosis: Secondary | ICD-10-CM | POA: Diagnosis not present

## 2016-01-07 NOTE — Telephone Encounter (Signed)
[employer] requested requip, Per Dr.Kumar, has to get from PCP.

## 2016-01-22 ENCOUNTER — Other Ambulatory Visit: Payer: Medicare Other

## 2016-01-23 ENCOUNTER — Telehealth: Payer: Self-pay | Admitting: Endocrinology

## 2016-01-23 NOTE — Telephone Encounter (Signed)
Patient stated he will no need our services he will be seeing another Endo Doctor.

## 2016-01-23 NOTE — Telephone Encounter (Signed)
FYI: please see below 

## 2016-01-25 ENCOUNTER — Ambulatory Visit: Payer: Medicare Other | Admitting: Endocrinology

## 2016-02-07 DIAGNOSIS — Z96652 Presence of left artificial knee joint: Secondary | ICD-10-CM | POA: Diagnosis not present

## 2016-02-11 DIAGNOSIS — R310 Gross hematuria: Secondary | ICD-10-CM | POA: Diagnosis not present

## 2016-02-11 DIAGNOSIS — Z8551 Personal history of malignant neoplasm of bladder: Secondary | ICD-10-CM | POA: Diagnosis not present

## 2016-02-13 DIAGNOSIS — I252 Old myocardial infarction: Secondary | ICD-10-CM | POA: Diagnosis not present

## 2016-02-13 DIAGNOSIS — E119 Type 2 diabetes mellitus without complications: Secondary | ICD-10-CM | POA: Diagnosis not present

## 2016-02-13 DIAGNOSIS — Z23 Encounter for immunization: Secondary | ICD-10-CM | POA: Diagnosis not present

## 2016-02-20 ENCOUNTER — Encounter: Payer: Self-pay | Admitting: Physician Assistant

## 2016-02-20 ENCOUNTER — Ambulatory Visit (INDEPENDENT_AMBULATORY_CARE_PROVIDER_SITE_OTHER): Payer: Medicare Other | Admitting: Physician Assistant

## 2016-02-20 ENCOUNTER — Telehealth: Payer: Self-pay | Admitting: *Deleted

## 2016-02-20 VITALS — BP 138/62 | HR 75 | Ht 70.0 in | Wt 206.8 lb

## 2016-02-20 DIAGNOSIS — E7800 Pure hypercholesterolemia, unspecified: Secondary | ICD-10-CM

## 2016-02-20 DIAGNOSIS — Z01810 Encounter for preprocedural cardiovascular examination: Secondary | ICD-10-CM

## 2016-02-20 DIAGNOSIS — I10 Essential (primary) hypertension: Secondary | ICD-10-CM

## 2016-02-20 DIAGNOSIS — I25118 Atherosclerotic heart disease of native coronary artery with other forms of angina pectoris: Secondary | ICD-10-CM

## 2016-02-20 DIAGNOSIS — G4733 Obstructive sleep apnea (adult) (pediatric): Secondary | ICD-10-CM

## 2016-02-20 NOTE — Patient Instructions (Addendum)
Medication Instructions:  Your physician recommends that you continue on your current medications as directed. Please refer to the Current Medication list given to you today.   Labwork: None  Testing/Procedures: None  Follow-Up: Your physician wants you to follow-up in: 1 year with Dr. Harrington Challenger. You will receive a reminder letter in the mail two months in advance. If you don't receive a letter, please call our office to schedule the follow-up appointment.   Any Other Special Instructions Will Be Listed Below (If Applicable).  If you need a refill on your cardiac medications before your next appointment, please call your pharmacy.

## 2016-02-20 NOTE — Progress Notes (Signed)
Cardiology Office Note:    Date:  02/20/2016   ID:  [name], DOB 1955/12/08, MRN [id number]  PCP:  Elby Showers, MD  Cardiologist:  Dr. [name]   Electrophysiologist:  N/a Endocrine: Dr. Susette Racer Jule Ser)  Orthopedics: Dr. Earlie Server with [facility]  Referring MD: Elby Showers, MD   Chief Complaint  Patient presents with  . Follow-up    CAD    History of Present Illness:    RINGO SHEROD is a 60 y.o. male with a hx of CAD and probable microvascular ischemia, DM, HL, HTN, bladder CA.  He has a prior history of non-STEMI in 2005 thought to be secondary to acute occlusion of the ramus intermediate.  LHC (2/09): Proximal LAD 30%, RCA with luminal irregularities, EF 60%; left renal artery 30%.  He demonstrated TIMI-2 flow in most of the coronary beds likely secondary to endothelial dysfunction or microvascular disease with a possible component of vasospasm.  Myoview in 2014 was low risk and neg for ischemia.  Last seen by Dr. [name] in 10/16.  In 1/17, he underwent L TKR with Dr. [name].    Of note, he has a clearance form for R TKR in his chart already signed by Dr. [name] 02/07/16 ("cleared for surgery").  Returns for FU.  He continues to have occasional episodes of chest pain and shortness of breath. These are stable over several years without significant change. He takes occasional nitroglycerin with relief. He denies orthopnea, PND or edema. He denies syncope. He had a UTI and hematuria earlier this year. He is followed by urology.  Prior CV studies that were reviewed today include:    Myoview 10/14 Low risk stress nuclear study Small inferior wall infarct at mid and basal level.  LV Ejection Fraction: 57%.   LHC 2/09 LM normal  LAD proximal 30, mid to dist mild to mod diff dz and poss vasospasm LCx normal RCA irregs EF 60% TIMI-2 flow in most of the coronary beds likely 2/2 endothelial dysfunction  Past Medical History:    Diagnosis Date  . Arthritis   . Bladder cancer [facility]) followed by dr Risa Grill  . Bladder cancer ([place])   . Coronary artery disease    followed by Dr. [name]; Lexiscan Myoview (10/14):  Low risk, EF 57%, small inf infarct  . Coronary vasospasm ([place]) PER DR ROSS NOTE 03-22-2012--  INTER. TIGHTNESS   PER PT PROBABLE FROM STRESS  . Depression    no rx  . Diabetes mellitus    followed by Dr. Dwyane Dee  . Dyslipidemia    pt unable to tolerate niaspan  . GERD (gastroesophageal reflux disease)   . History of angina CHRONIC -- CONTROLLED W/ IMDUR  . 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 History of non-ST elevation myocardial infarction (NSTEMI) 2005  . Hypertension   . OSA on CPAP   . Peripheral neuropathy (HCC) RIGHT LEG NUMBNESS    Past Surgical History:  Procedure Laterality Date  . APPENDECTOMY  08-03-2004  . CARDIAC CATHETERIZATION  x3  last one 06-17-2007   MILD NON-OBSTRUCTIVE CAD/ NORMAL LVF/ 30% LEFT RENAL ARTERY STENOSIS  . CYSTO/ BILATERAL RETROGRADE PYELOGRAM/ RIGHT URETEROSCOPIC LASER FULGURATION URETERAL TUMOR  02-16-2008  . CYSTO/ BLADDER BX  09-13-2008;  08-13-2007; 04-19-2007; 06-01-2006  . CYSTO/ CYSTOGRAM/ URETEROSCOPY  02-21-2009  . CYSTO/ RESECTION BLADDER TUMOR/ LEFT RETROGRADE PYELOGRAM/ RIGHT URETEROSCOPY  08-07-2010   TCC OF BLADDER  S/P DISTAL URETERECTOMY/ REIMPLANTATION  . CYSTO/ RIGHT URETEROSCOPY/ BX URETERAL TUMOR  10-11-2008  . CYSTOSCOPY W/ RETROGRADES  04/19/2012   Procedure: CYSTOSCOPY WITH RETROGRADE PYELOGRAM;  Surgeon: Bernestine Amass, MD;  Location: [facility];  Service: Urology;  Laterality: Bilateral;  30 MINS Cysto, Biopsy, possible TURBT, Bilateral retrograde pyelograms with Mitomycin C instilation Post op   . CYSTOSCOPY WITH BIOPSY  04/07/2011   Procedure: CYSTOSCOPY WITH BIOPSY/ RIGHT URETEROSCOPY;  Surgeon: Bernestine Amass, MD;  Location: [facility];  Service: Urology;   Laterality: N/A;    cystoscopy, cystogram, biopsy and fulgeration  . CYSTOSCOPY WITH BIOPSY  12/29/2011   Procedure: CYSTOSCOPY WITH BIOPSY;  Surgeon: Bernestine Amass, MD;  Location: [facility];  Service: Urology;  Laterality: N/A;  [place]    . ELBOW SURGERY  07-07-2003   RIGHT ELBOW ARTHROSCOPY W/ OPEN RECONSTRUCTION  . KNEE ARTHROSCOPY W/ DEBRIDEMENT  02-08-2004   RIGHT KNEE  . RHINOPLASTY W/ MODIFIED CARTILAGE GRAFT  05-17-2007   INTERNAL NASAL VALVE COLLAPSE/ OSA/ SEPTAL PERFERATION  . RIGHT DISTAL URETERECTOMY W/ REIMPLANTATION  10-30-2008   TCC OF RIGHT DISTAL URETER  . SHOULDER SURGERY  2005   RIGHT ROTATOR CUFF REPAIR  . TOTAL KNEE ARTHROPLASTY Left 05/18/2015  . TOTAL KNEE ARTHROPLASTY Left 05/18/2015   Procedure: TOTAL KNEE ARTHROPLASTY;  Surgeon: Earlie Server, MD;  Location: [place];  Service: Orthopedics;  Laterality: Left;  . TRANSURETHRAL RESECTION OF BLADDER TUMOR  01-13-2007  . TRANSURETHRAL RESECTION OF BLADDER TUMOR  04/19/2012   Procedure: TRANSURETHRAL RESECTION OF BLADDER TUMOR (TURBT);  Surgeon: Bernestine Amass, MD;  Location: [facility];  Service: Urology;  Laterality: N/A;  . umbillical hernia repair  2004    Current Medications: Current Meds  Medication Sig  . amLODipine (NORVASC) 10 MG tablet Take 1 tablet (10 mg total) by mouth daily. Hold for SBP < 110  . aspirin EC 81 MG tablet Take 81 mg by mouth daily.  . canagliflozin (INVOKANA) 300 MG TABS tablet Take 1 tablet (300 mg total) by mouth daily.  Marland Kitchen glimepiride (AMARYL) 2 MG tablet TAKE 1 BY MOUTH DAILY BEFORE SUPPER  . HYDROmorphone (DILAUDID) 2 MG tablet Take 2 mg by mouth every 6 (six) hours as needed for severe pain. Reported on 10/25/2015  . isosorbide mononitrate (IMDUR) 120 MG 24 hr tablet Take 1 tablet (120 mg total) by mouth every morning.  . metFORMIN (GLUCOPHAGE) 1000 MG tablet Take 1 tablet (1,000 mg total) by mouth 2 (two) times daily with a meal.    . methocarbamol (ROBAXIN) 750 MG tablet TAKE ONE TABLET BY MOUTH EVERY SIX TO EIGHT HOURS AS NEEDED FOR SPASMS  . metoprolol succinate (TOPROL-XL) 50 MG 24 hr tablet Take 1 tablet (50 mg total) by mouth daily. Take with or immediately following a meal. Hold for SBP <110 ID<60  . nitroGLYCERIN (NITROSTAT) 0.4 MG SL tablet Place 1 tablet (0.4 mg total) under the tongue every 5 (five) minutes as needed for chest pain.  . pantoprazole (PROTONIX) 40 MG tablet Take 1 tablet (40 mg total) by mouth every morning.  Marland Kitchen rOPINIRole (REQUIP) 1 MG tablet 1.5 tablets by mouth at bedtime for restless legs  . rosuvastatin (CRESTOR) 10 MG tablet TAKE 1 TABLET (10 MG TOTAL) BY MOUTH EVERY OTHER DAY     Allergies:   Review of patient's allergies indicates no known allergies.   Social History   Social History  .  Marital status: Single    Spouse name: N/A  . Number of children: N/A  . Years of education: N/A   Social History Main Topics  . Smoking status: Former Smoker    Packs/day: 0.50    Years: 20.00    Types: Cigarettes    Quit date: 05/13/2007  . Smokeless tobacco: Never Used  . Alcohol use No  . Drug use: No  . Sexual activity: Not Asked   Other Topics Concern  . None   Social History Narrative  . None     Family History:  The patient's family history includes Coronary artery disease in his father; Diabetes in his maternal grandmother; Heart disease in his paternal grandfather.   ROS:   Please see the history of present illness.    Review of Systems  Constitution: Positive for chills, diaphoresis and malaise/fatigue.  Cardiovascular: Positive for chest pain, dyspnea on exertion and leg swelling.  Gastrointestinal: Positive for diarrhea.  Genitourinary: Positive for hematuria.  Neurological: Positive for dizziness.  Psychiatric/Behavioral: Positive for depression. The patient is nervous/anxious.    All other systems reviewed and are negative.   EKGs/Labs/Other Test Reviewed:    EKG:   EKG is  ordered today.  The ekg ordered today demonstrates NSR, HR 75, left axis deviation, PACs, QTc 435 ms, no significant change compared to prior tracing dated 02/15/15  Recent Labs: 05/21/2015: Hemoglobin 12.0; Platelets 145 10/22/2015: ALT 34; BUN 16; Creatinine, Ser 0.97; Potassium 4.7; Sodium 136   Recent Lipid Panel    Component Value Date/Time   CHOL 128 10/22/2015 1054   TRIG 156.0 (H) 10/22/2015 1054   HDL 38.10 (L) 10/22/2015 1054   CHOLHDL 3 10/22/2015 1054   VLDL 31.2 10/22/2015 1054   LDLCALC 58 10/22/2015 1054   LDLDIRECT 96.0 07/20/2015 1035     Physical Exam:    VS:  BP 138/62   Pulse 75   Ht 5\' 10"  (1.778 m)   Wt 206 lb 12.8 oz (93.8 kg)   BMI 29.67 kg/m     Wt Readings from Last 3 Encounters:  02/20/16 206 lb 12.8 oz (93.8 kg)  10/25/15 195 lb (88.5 kg)  07/25/15 187 lb (84.8 kg)     Physical Exam  Constitutional: He is oriented to person, place, and time. He appears well-developed and well-nourished. No distress.  HENT:  Head: Normocephalic and atraumatic.  Eyes: No scleral icterus.  Neck: Normal range of motion. No JVD present.  Cardiovascular: Normal rate, regular rhythm, S1 normal and S2 normal.   No murmur heard. Pulmonary/Chest: Effort normal and breath sounds normal. He has no wheezes. He has no rhonchi. He has no rales.  Abdominal: Soft. There is no tenderness.  Musculoskeletal: He exhibits no edema.  Neurological: He is alert and oriented to person, place, and time.  Skin: Skin is warm and dry.  Psychiatric: He has a normal mood and affect.    ASSESSMENT:    1. Atherosclerosis of native coronary artery of native heart with other form of angina pectoris ([place])   2. Essential hypertension   3. Pure hypercholesterolemia   4. Preoperative cardiovascular examination   5. OSA (obstructive sleep apnea)    PLAN:    In order of problems listed above:  1. CAD - He has minimal CAD by cardiac catheterization in the past. He also has a  history of microvascular ischemia as well as possible vasospasm. He has chronic stable anginal symptoms without significant change. Myoview in 2014 was low risk. Continue  aspirin, amlodipine, nitrates, beta blocker, statin.  2. HTN -  Blood pressure is controlled.  3. HL -  LDL in 6/17 was 58.  Continue statin.   4. Surgical Clearance - He has already been cleared for his upcoming total knee replacement by Dr. Harrington Challenger.  No further cardiac testing needed.  5. OSA - He has restless leg syndrome and is requesting refills on Requip.  I have asked him to FU with his PCP for this.  I did ask him if he is interested in seeing Dr. Fransico Him or Dr. Shelva Majestic with our group for FU on his OSA.  He will think about it and let us know.    Medication Adjustments/Labs and Tests Ordered: Current medicines are reviewed at length with the patient today.  Concerns regarding medicines are outlined above.  Medication changes, Labs and Tests ordered today are outlined in the Patient Instructions noted below. Patient Instructions  Medication Instructions:  Your physician recommends that you continue on your current medications as directed. Please refer to the Current Medication list given to you today.   Labwork: None  Testing/Procedures: None  Follow-Up: Your physician wants you to follow-up in: 1 year with Dr. Harrington Challenger. You will receive a reminder letter in the mail two months in advance. If you don't receive a letter, please call our office to schedule the follow-up appointment.   Any Other Special Instructions Will Be Listed Below (If Applicable).  If you need a refill on your cardiac medications before your next appointment, please call your pharmacy.  Signed, Richardson Dopp, PA-C  02/20/2016 5:39 PM    [facility] [place] [place], [place], [place]  [address] Phone: [phone]; Fax: [phone]

## 2016-02-20 NOTE — Telephone Encounter (Signed)
Received second request for surgical clearance for right total knee replacement by [place].  This was faxed on 02/14/16 and was scanned into EPIC at that time.   Refaxed via EPIC at this time.  See media tab for more details.

## 2016-02-25 ENCOUNTER — Ambulatory Visit: Payer: Medicare Other | Admitting: Physician Assistant

## 2016-02-26 ENCOUNTER — Other Ambulatory Visit: Payer: Self-pay

## 2016-02-26 DIAGNOSIS — M1711 Unilateral primary osteoarthritis, right knee: Secondary | ICD-10-CM | POA: Diagnosis not present

## 2016-03-04 ENCOUNTER — Ambulatory Visit: Payer: Self-pay | Admitting: Physician Assistant

## 2016-03-04 NOTE — H&P (Signed)
TOTAL KNEE ADMISSION H&P  Patient is being admitted for right total knee arthroplasty.  Subjective:  Chief Complaint:right knee pain.  HPI: Isaac [name], 60 y.o. male, has a history of pain and functional disability in the right knee due to arthritis and has failed non-surgical conservative treatments for greater than 12 weeks to includeNSAID's and/or analgesics, corticosteriod injections, viscosupplementation injections, use of assistive devices and activity modification.  Onset of symptoms was gradual, starting >10 years ago with gradually worsening course since that time. The patient noted prior procedures on the knee to include  arthroscopy, menisectomy and microfracture on the right knee(s).  Patient currently rates pain in the right knee(s) at 10 out of 10 with activity. Patient has night pain, worsening of pain with activity and weight bearing, pain that interferes with activities of daily living, pain with passive range of motion, crepitus and joint swelling.  Patient has evidence of periarticular osteophytes and joint space narrowing by imaging studies.There is no active infection.  Patient Active Problem List   Diagnosis Date Noted  . Gout 10/22/2015  . Primary localized osteoarthritis of left knee 05/18/2015  . Personal history of colonic polyps 09/07/2013  . DM (diabetes mellitus) type 2, uncontrolled, with ketoacidosis ([place]) 06/19/2011  . Anxiety and depression 06/19/2011  . History of skin cancer 06/19/2011  . BLADDER CANCER , UNSPEC. 05/10/2008  . Hyperlipidemia 05/10/2008  . CAD, NATIVE VESSEL 05/10/2008  . RESTLESS LEGS SYNDROME 11/17/2007  . Essential hypertension 11/16/2007  . OSA (obstructive sleep apnea) 11/16/2007   Past Medical History:  Diagnosis Date  . Arthritis   . Bladder cancer [facility]) followed by dr Risa Grill  . Bladder cancer ([place])   . Coronary artery disease    followed by Dr. [name]; Lexiscan Myoview (10/14):  Low risk, EF 57%, small inf infarct   . Coronary vasospasm ([place]) PER DR ROSS NOTE 03-22-2012--  INTER. TIGHTNESS   PER PT PROBABLE FROM STRESS  . Depression    no rx  . Diabetes mellitus    followed by Dr. Dwyane Dee  . Dyslipidemia    pt unable to tolerate niaspan  . GERD (gastroesophageal reflux disease)   . History of angina CHRONIC -- CONTROLLED W/ IMDUR  . 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 History of non-ST elevation myocardial infarction (NSTEMI) 2005  . Hypertension   . OSA on CPAP   . Peripheral neuropathy (HCC) RIGHT LEG NUMBNESS    Past Surgical History:  Procedure Laterality Date  . APPENDECTOMY  08-03-2004  . CARDIAC CATHETERIZATION  x3  last one 06-17-2007   MILD NON-OBSTRUCTIVE CAD/ NORMAL LVF/ 30% LEFT RENAL ARTERY STENOSIS  . CYSTO/ BILATERAL RETROGRADE PYELOGRAM/ RIGHT URETEROSCOPIC LASER FULGURATION URETERAL TUMOR  02-16-2008  . CYSTO/ BLADDER BX  09-13-2008;  08-13-2007; 04-19-2007; 06-01-2006  . CYSTO/ CYSTOGRAM/ URETEROSCOPY  02-21-2009  . CYSTO/ RESECTION BLADDER TUMOR/ LEFT RETROGRADE PYELOGRAM/ RIGHT URETEROSCOPY  08-07-2010   TCC OF BLADDER   S/P DISTAL URETERECTOMY/ REIMPLANTATION  . CYSTO/ RIGHT URETEROSCOPY/ BX URETERAL TUMOR  10-11-2008  . CYSTOSCOPY W/ RETROGRADES  04/19/2012   Procedure: CYSTOSCOPY WITH RETROGRADE PYELOGRAM;  Surgeon: Bernestine Amass, MD;  Location: [facility];  Service: Urology;  Laterality: Bilateral;  30 MINS Cysto, Biopsy, possible TURBT, Bilateral retrograde pyelograms with Mitomycin C instilation Post op   . CYSTOSCOPY WITH BIOPSY  04/07/2011   Procedure: CYSTOSCOPY WITH BIOPSY/ RIGHT URETEROSCOPY;  Surgeon: Bernestine Amass, MD;  Location: [facility];  Service: Urology;  Laterality: N/A;    cystoscopy, cystogram, biopsy and fulgeration  . CYSTOSCOPY WITH BIOPSY  12/29/2011   Procedure: CYSTOSCOPY WITH BIOPSY;  Surgeon: Bernestine Amass, MD;  Location[facility];  Service:  Urology;  Laterality: N/A;  [place]    . ELBOW SURGERY  07-07-2003   RIGHT ELBOW ARTHROSCOPY W/ OPEN RECONSTRUCTION  . KNEE ARTHROSCOPY W/ DEBRIDEMENT  02-08-2004   RIGHT KNEE  . RHINOPLASTY W/ MODIFIED CARTILAGE GRAFT  05-17-2007   INTERNAL NASAL VALVE COLLAPSE/ OSA/ SEPTAL PERFERATION  . RIGHT DISTAL URETERECTOMY W/ REIMPLANTATION  10-30-2008   TCC OF RIGHT DISTAL URETER  . SHOULDER SURGERY  2005   RIGHT ROTATOR CUFF REPAIR  . TOTAL KNEE ARTHROPLASTY Left 05/18/2015  . TOTAL KNEE ARTHROPLASTY Left 05/18/2015   Procedure: TOTAL KNEE ARTHROPLASTY;  Surgeon: Earlie Server, MD;  Location: [place];  Service: Orthopedics;  Laterality: Left;  . TRANSURETHRAL RESECTION OF BLADDER TUMOR  01-13-2007  . TRANSURETHRAL RESECTION OF BLADDER TUMOR  04/19/2012   Procedure: TRANSURETHRAL RESECTION OF BLADDER TUMOR (TURBT);  Surgeon: Bernestine Amass, MD;  Location: [facility];  Service: Urology;  Laterality: N/A;  . umbillical hernia repair  2004     (Not in a hospital admission) No Known Allergies  Social History  Substance Use Topics  . Smoking status: Former Smoker    Packs/day: 0.50    Years: 20.00    Types: Cigarettes    Quit date: 05/13/2007  . Smokeless tobacco: Never Used  . Alcohol use No    Family History  Problem Relation Age of Onset  . Coronary artery disease Father   . Diabetes Maternal Grandmother   . Heart disease Paternal Grandfather   . Colon cancer Neg Hx   . Stomach cancer Neg Hx      Review of Systems  HENT: Positive for tinnitus.   Respiratory: Positive for shortness of breath.   Cardiovascular: Positive for chest pain.  Gastrointestinal: Positive for constipation and diarrhea.  Genitourinary: Positive for hematuria.  Musculoskeletal: Positive for joint pain.  Neurological: Positive for dizziness.  All other systems reviewed and are negative.   Objective:  Physical Exam  Constitutional: He is oriented to person, place, and  time. He appears well-developed and well-nourished. No distress.  HENT:  Head: Normocephalic and atraumatic.  Nose: Nose normal.  Eyes: Conjunctivae and EOM are normal. Pupils are equal, round, and reactive to light.  Neck: Normal range of motion. Neck supple.  Cardiovascular: Normal rate, regular rhythm, normal heart sounds and intact distal pulses.   Respiratory: Effort normal and breath sounds normal. No respiratory distress. He has no wheezes.  GI: Soft. Bowel sounds are normal. He exhibits no distension. There is no tenderness.  Musculoskeletal:       Right knee: He exhibits swelling. He exhibits no LCL laxity and no MCL laxity. Tenderness found.  Lymphadenopathy:    He has no cervical adenopathy.  Neurological: He is alert and oriented to person, place, and time. No cranial nerve deficit.  Skin: Skin is warm and dry. No rash noted. No erythema.  Psychiatric: He has a normal mood and affect. His behavior is normal.    Vital signs in last 24 hours: @VSRANGES @  Labs:   Estimated body mass index is 29.67 kg/m as calculated from the following:   Height as of 02/20/16: 5\' 10"  (1.778 m).   Weight as of 02/20/16: 93.8 kg (206 lb 12.8 oz).  Imaging Review Plain radiographs demonstrate moderate degenerative joint disease of the right knee(s). The overall alignment ismild varus. The bone quality appears to be good for age and reported activity level.  Assessment/Plan:  End stage arthritis, right knee   The patient history, physical examination, clinical judgment of the provider and imaging studies are consistent with end stage degenerative joint disease of the right knee(s) and total knee arthroplasty is deemed medically necessary. The treatment options including medical management, injection therapy arthroscopy and arthroplasty were discussed at length. The risks and benefits of total knee arthroplasty were presented and reviewed. The risks due to aseptic loosening, infection,  stiffness, patella tracking problems, thromboembolic complications and other imponderables were discussed. The patient acknowledged the explanation, agreed to proceed with the plan and consent was signed. Patient is being admitted for inpatient treatment for surgery, pain control, PT, OT, prophylactic antibiotics, VTE prophylaxis, progressive ambulation and ADL's and discharge planning. The patient is planning to be discharged to skilled [place].

## 2016-03-06 ENCOUNTER — Other Ambulatory Visit: Payer: Self-pay | Admitting: *Deleted

## 2016-03-06 MED ORDER — AMLODIPINE BESYLATE 10 MG PO TABS: 10.0000 mg | ORAL_TABLET | Freq: Every day | ORAL | 3 refills | Status: DC

## 2016-03-06 MED ORDER — METOPROLOL SUCCINATE ER 50 MG PO TB24: 50.0000 mg | ORAL_TABLET | Freq: Every day | ORAL | 3 refills | Status: DC

## 2016-03-11 ENCOUNTER — Encounter (HOSPITAL_COMMUNITY)
Admission: RE | Admit: 2016-03-11 | Discharge: 2016-03-11 | Disposition: A | Discharge status: Home / Self Care | Payer: Medicare Other | Source: Ambulatory Visit | Attending: Orthopedic Surgery | Admitting: Orthopedic Surgery

## 2016-03-11 ENCOUNTER — Encounter (HOSPITAL_COMMUNITY): Payer: Self-pay

## 2016-03-11 DIAGNOSIS — M1711 Unilateral primary osteoarthritis, right knee: Secondary | ICD-10-CM | POA: Diagnosis not present

## 2016-03-11 DIAGNOSIS — Z01818 Encounter for other preprocedural examination: Secondary | ICD-10-CM | POA: Diagnosis not present

## 2016-03-11 DIAGNOSIS — Z01812 Encounter for preprocedural laboratory examination: Secondary | ICD-10-CM | POA: Insufficient documentation

## 2016-03-11 HISTORY — DX: Pneumonia, unspecified organism: J18.9

## 2016-03-11 LAB — CBC WITH DIFFERENTIAL/PLATELET
Basophils Absolute: 0 10*3/uL (ref 0.0–0.1)
Basophils Relative: 0 %
EOS PCT: 3 %
Eosinophils Absolute: 0.1 10*3/uL (ref 0.0–0.7)
HCT: 46 % (ref 39.0–52.0)
Hemoglobin: 15.2 g/dL (ref 13.0–17.0)
LYMPHS ABS: 1.8 10*3/uL (ref 0.7–4.0)
LYMPHS PCT: 41 %
MCH: 30.5 pg (ref 26.0–34.0)
MCHC: 33 g/dL (ref 30.0–36.0)
MCV: 92.4 fL (ref 78.0–100.0)
MONO ABS: 0.4 10*3/uL (ref 0.1–1.0)
Monocytes Relative: 10 %
Neutro Abs: 2.1 10*3/uL (ref 1.7–7.7)
Neutrophils Relative %: 46 %
PLATELETS: 171 10*3/uL (ref 150–400)
RBC: 4.98 MIL/uL (ref 4.22–5.81)
RDW: 14.7 % (ref 11.5–15.5)
WBC: 4.5 10*3/uL (ref 4.0–10.5)

## 2016-03-11 LAB — COMPREHENSIVE METABOLIC PANEL
ALT: 90 U/L — ABNORMAL HIGH (ref 17–63)
ANION GAP: 10 (ref 5–15)
AST: 58 U/L — ABNORMAL HIGH (ref 15–41)
Albumin: 4.2 g/dL (ref 3.5–5.0)
Alkaline Phosphatase: 49 U/L (ref 38–126)
BUN: 11 mg/dL (ref 6–20)
CHLORIDE: 105 mmol/L (ref 101–111)
CO2: 23 mmol/L (ref 22–32)
CREATININE: 0.96 mg/dL (ref 0.61–1.24)
Calcium: 9.6 mg/dL (ref 8.9–10.3)
Glucose, Bld: 195 mg/dL — ABNORMAL HIGH (ref 65–99)
POTASSIUM: 4.2 mmol/L (ref 3.5–5.1)
Sodium: 138 mmol/L (ref 135–145)
Total Bilirubin: 0.7 mg/dL (ref 0.3–1.2)
Total Protein: 7 g/dL (ref 6.5–8.1)

## 2016-03-11 LAB — TYPE AND SCREEN
ABO/RH(D): A POS
Antibody Screen: NEGATIVE

## 2016-03-11 LAB — URINE MICROSCOPIC-ADD ON: RBC / HPF: NONE SEEN RBC/hpf (ref 0–5)

## 2016-03-11 LAB — URINALYSIS, ROUTINE W REFLEX MICROSCOPIC
Bilirubin Urine: NEGATIVE
Hgb urine dipstick: NEGATIVE
KETONES UR: NEGATIVE mg/dL
LEUKOCYTES UA: NEGATIVE
Nitrite: NEGATIVE
PH: 5.5 (ref 5.0–8.0)
Protein, ur: NEGATIVE mg/dL
Specific Gravity, Urine: 1.02 (ref 1.005–1.030)

## 2016-03-11 LAB — PROTIME-INR
INR: 0.94
PROTHROMBIN TIME: 12.5 s (ref 11.4–15.2)

## 2016-03-11 LAB — APTT: aPTT: 26 seconds (ref 24–36)

## 2016-03-11 LAB — GLUCOSE, CAPILLARY: GLUCOSE-CAPILLARY: 242 mg/dL — AB (ref 65–99)

## 2016-03-11 LAB — SURGICAL PCR SCREEN
MRSA, PCR: NEGATIVE
STAPHYLOCOCCUS AUREUS: NEGATIVE

## 2016-03-11 NOTE — Pre-Procedure Instructions (Signed)
Isaac [name]  03/11/2016      [facility] (MAIL ORDER) ELECTRONIC - [name], [place] - [place] [address] [place] [address] Phone: [phone] Fax: [phone]  [facility] filled by [place], [address] [place] [address] Phone: [phone] Fax: [place], [place], [place] - [address] [place] [address] Phone: [phone] Fax: [place], [place] [address] [place] [address] Phone: [phone] Fax: [phone]    Your procedure is scheduled on Nov 10  Report to [facility] at 815 A.M.  Call this number if you have problems the morning of surgery:  [phone]   Remember:  Do not eat food or drink liquids after midnight.  Take these medicines the morning of surgery with A SIP OF WATER Amlodipine (Norvasc), Isosorbide mononitrate (Imdur), Nitroglycerin if needed, Pantoprazole (Protonix)  Amaryl- do not take supper dose day before surgery Metformin (Glucophage) and Invokana -take normal dose day before surgery Do not take any day of surgery.   Stop taking aspirin, BC's, Goody's, herbal medications, Fish Oil, Ibuprofen, Advil, Motrin, Aleve, Vitamins    How to Manage Your Diabetes Before and After Surgery  Why is it important to control my blood sugar before and after surgery? . Improving blood sugar levels before and after surgery helps healing and can limit problems. . A way of improving blood sugar control is eating a healthy diet by: o  Eating less sugar and carbohydrates o  Increasing activity/exercise o  Talking with your doctor about reaching your blood sugar goals . High blood sugars (greater than 180 mg/dL) can raise your risk of infections and slow your recovery, so you will need  to focus on controlling your diabetes during the weeks before surgery. . Make sure that the doctor who takes care of your diabetes knows about your planned surgery including the date and location.  How do I manage my blood sugar before surgery? . Check your blood sugar at least 4 times a day, starting 2 days before surgery, to make sure that the level is not too high or low. o Check your blood sugar the morning of your surgery when you wake up and every 2 hours until you get to the Short Stay unit. . If your blood sugar is less than 70 mg/dL, you will need to treat for low blood sugar: o Do not take insulin. o Treat a low blood sugar (less than 70 mg/dL) with  cup of clear juice (cranberry or apple), 4 glucose tablets, OR glucose gel. o Recheck blood sugar in 15 minutes after treatment (to make sure it is greater than 70 mg/dL). If your blood sugar is not greater than 70 mg/dL on recheck, call [phone] for further instructions. . Report your blood sugar to the short stay nurse when you get to Short Stay.  . If you are admitted to the hospital after surgery: o Your blood sugar will be checked by the staff and you will probably be given insulin after surgery (instead of oral diabetes medicines) to make sure you have good blood sugar levels. o The goal for blood sugar control after surgery is 80-180 mg/dL      WHAT DO I DO ABOUT MY DIABETES MEDICATION?   Marland Kitchen  Do not take oral diabetes medicines (pills) the morning of surgery.      Other Instructions:          Patient Signature:  Date:   Nurse Signature:  Date:   Reviewed and Endorsed by [facility] Patient Education Committee, August 2015  Do not wear jewelry, make-up or nail polish.  Do not wear lotions, powders, or perfumes, or deoderant.  Do not shave 48 hours prior to surgery.  Men may shave face and neck.  Do not bring valuables to the hospital.  [facility] is not responsible for any belongings or valuables.  Contacts,  dentures or bridgework may not be worn into surgery.  Leave your suitcase in the car.  After surgery it may be brought to your room.  For patients admitted to the hospital, discharge time will be determined by your treatment team.  Patients discharged the day of surgery will not be allowed to drive home.    Special instructions:  [place] - Preparing for Surgery  Before surgery, you can play an important role.  Because skin is not sterile, your skin needs to be as free of germs as possible.  You can reduce the number of germs on you skin by washing with CHG (chlorahexidine gluconate) soap before surgery.  CHG is an antiseptic cleaner which kills germs and bonds with the skin to continue killing germs even after washing.  Please DO NOT use if you have an allergy to CHG or antibacterial soaps.  If your skin becomes reddened/irritated stop using the CHG and inform your nurse when you arrive at Short Stay.  Do not shave (including legs and underarms) for at least 48 hours prior to the first CHG shower.  You may shave your face.  Please follow these instructions carefully:   1.  Shower with CHG Soap the night before surgery and the   morning of Surgery.  2.  If you choose to wash your hair, wash your hair first as usual with your   normal shampoo.  3.  After you shampoo, rinse your hair and body thoroughly to remove the Shampoo.  4.  Use CHG as you would any other liquid soap.  You can apply chg directly  to the skin and wash gently with scrungie or a clean washcloth.  5.  Apply the CHG Soap to your body ONLY FROM THE NECK DOWN.  Do not use on open wounds or open sores.  Avoid contact with your eyes,  ears, mouth and genitals (private parts).  Wash genitals (private parts)  with your normal soap.  6.  Wash thoroughly, paying special attention to the area where your surgery will be performed.  7.  Thoroughly rinse your body with warm water from the neck down.  8.  DO NOT shower/wash with your  normal soap after using and rinsing off  the CHG Soap.  9.  Pat yourself dry with a clean towel.            10.  Wear clean pajamas.            11.  Place clean sheets on your bed the night of your first shower and do not sleep with pets.  Day of Surgery  Do not apply any lotions/deoderants the morning of surgery.  Please wear clean clothes to the hospital/surgery center.     Please read over the following fact sheets that you were given. Pain Booklet, Coughing and Deep Breathing, MRSA Information and Surgical  Site Infection Prevention

## 2016-03-11 NOTE — Progress Notes (Addendum)
PCP is Dr. Tedra Senegal Dr Veva Holes manages his Dm Dr Harrington Challenger is his cardiologist. Instructed to bring CPAP mask on the day of surgery.- Voices understanding CBG was 242 today reports fasting cbg run 120-130. Stress test 2014 Card cath 2009 Sleep study 09-24-2010

## 2016-03-12 ENCOUNTER — Other Ambulatory Visit: Payer: Self-pay | Admitting: *Deleted

## 2016-03-12 LAB — URINE CULTURE: CULTURE: NO GROWTH

## 2016-03-12 LAB — HEMOGLOBIN A1C
HEMOGLOBIN A1C: 7.3 % — AB (ref 4.8–5.6)
Mean Plasma Glucose: 163 mg/dL

## 2016-03-12 MED ORDER — METOPROLOL SUCCINATE ER 50 MG PO TB24: 50.0000 mg | ORAL_TABLET | Freq: Every day | ORAL | 3 refills | Status: DC

## 2016-03-12 MED ORDER — ROSUVASTATIN CALCIUM 10 MG PO TABS: ORAL_TABLET | ORAL | 3 refills | Status: DC

## 2016-03-12 MED ORDER — AMLODIPINE BESYLATE 10 MG PO TABS: 10.0000 mg | ORAL_TABLET | Freq: Every day | ORAL | 3 refills | Status: DC

## 2016-03-12 NOTE — Progress Notes (Signed)
Anesthesia Chart Review: Patient is a 60 year old male scheduled for right TKA on 03/21/16 by Dr. [name].  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 Risa Grill), depression, peripheral neuropathy RLE, UHR, rhinoplasty, left TKA 05/18/15.   PCP is listed as Dr. Tedra Senegal.  Endocrinologist is Dr. Susette Racer with [facility] (previously saw Dr. Elayne Snare).  Saw GI Dr. [name] 01/2015 for elevated LFTs and found to have fatty liver by U/S. Admits to "social" alcohol intake. Cardiologist is Dr. [name], last visit 02/20/16 with Richardson Dopp, PA-C. No further pre-operative testing recommended.    Meds include ASA 81 mg, Invokana, glimepiride, Dilaudid, Imdur, metformin, Robaxin, Nitro, Protonix, Requip.   BP 134/81   Pulse 72   Temp 36.7 C   Resp 18   Ht 5\' 9"  (1.753 m)   Wt 206 lb 6.4 oz (93.6 kg)   SpO2 98%   BMI 30.48 kg/m   02/20/16 EKG ([facility]): SR with marked sinus arrhythmia, LAD, incomplete right BBB, possible anterior infarct (age undetermined).  02/23/13 Nuclear stress test: Overall Impression: Low risk stress nuclear study Small inferior wall infarct at mid and basal level. LV Ejection Fraction: 57%. LV Wall Motion: NL LV Function; NL Wall Motion.  06/17/07 Cardiac cath: ASSESSMENT: 1. Mild nonobstructive coronary artery disease as described above. 2. TIMI-2 flow in most of the coronary beds likely secondary to  endothelial dysfunction or microvascular disease with a possible  component of vasospasm. 3. Normal left ventricular function. 4. Thirty-percent left renal artery stenosis. PLAN: [name] truly has evidence of endothelial dysfunction and perhaps microvascular disease and vasospasm. He will need  aggressive risk factor management. I emphasized to him that stopping smoking was very, very important. He also needed to be treated with aspirin, nitroglycerin and possibly calcium channel blockers. He will follow up with Dr. Harrington Challenger.  05/08/15 CXR: IMPRESSION: Normal chest.  Preoperative labs noted. AST, ALT mildly elevated at 58, 90 respectively (LFTs have been normal to mildly elevated since at least 08/2013; saw GI in 2016 for elevated LFTs and found to have fatty liver). PLT 143K. Cr 1.08. PT/PTT WNL. Urine culture showed no growth. A1C was 7.3   He has been cleared by cardiology following recent evaluation. LFTs have been intermittently elevated since at least 2015, but are still less than two times above normal. PLT count and PT/PTT are normal. I am not planning to repeat LFTs prior to surgery. If no acute changes then I would anticipate that he could proceed as planned.  George Hugh [facility] Short Stay Center/Anesthesiology Phone [phone] 03/12/2016 4:25 PM

## 2016-03-20 MED ORDER — TRANEXAMIC ACID 1000 MG/10ML IV SOLN
1000.0000 mg | INTRAVENOUS | Status: AC
  Administered 2016-03-21: 1000 mg via INTRAVENOUS
  Filled 2016-03-20: qty 10

## 2016-03-21 ENCOUNTER — Encounter (HOSPITAL_COMMUNITY): Payer: Self-pay | Admitting: *Deleted

## 2016-03-21 ENCOUNTER — Inpatient Hospital Stay (HOSPITAL_COMMUNITY): Payer: Medicare Other | Admitting: Anesthesiology

## 2016-03-21 ENCOUNTER — Inpatient Hospital Stay (HOSPITAL_COMMUNITY): Payer: Medicare Other | Admitting: Emergency Medicine

## 2016-03-21 ENCOUNTER — Inpatient Hospital Stay (HOSPITAL_COMMUNITY)
Admission: RE | Admit: 2016-03-21 | Discharge: 2016-03-24 | DRG: 470 | Disposition: A | Discharge status: Skilled Nursing Facility | Payer: Medicare Other | Source: Ambulatory Visit | Attending: Orthopedic Surgery | Admitting: Orthopedic Surgery

## 2016-03-21 ENCOUNTER — Encounter (HOSPITAL_COMMUNITY)
Admission: RE | Disposition: A | Discharge status: Skilled Nursing Facility | Payer: Self-pay | Source: Ambulatory Visit | Attending: Orthopedic Surgery

## 2016-03-21 DIAGNOSIS — Z9989 Dependence on other enabling machines and devices: Secondary | ICD-10-CM

## 2016-03-21 DIAGNOSIS — Z23 Encounter for immunization: Secondary | ICD-10-CM

## 2016-03-21 DIAGNOSIS — Z79899 Other long term (current) drug therapy: Secondary | ICD-10-CM

## 2016-03-21 DIAGNOSIS — M1711 Unilateral primary osteoarthritis, right knee: Secondary | ICD-10-CM | POA: Diagnosis not present

## 2016-03-21 DIAGNOSIS — R2689 Other abnormalities of gait and mobility: Secondary | ICD-10-CM | POA: Diagnosis not present

## 2016-03-21 DIAGNOSIS — I2510 Atherosclerotic heart disease of native coronary artery without angina pectoris: Secondary | ICD-10-CM | POA: Diagnosis present

## 2016-03-21 DIAGNOSIS — E669 Obesity, unspecified: Secondary | ICD-10-CM | POA: Diagnosis not present

## 2016-03-21 DIAGNOSIS — T451X5A Adverse effect of antineoplastic and immunosuppressive drugs, initial encounter: Secondary | ICD-10-CM

## 2016-03-21 DIAGNOSIS — I10 Essential (primary) hypertension: Secondary | ICD-10-CM | POA: Diagnosis not present

## 2016-03-21 DIAGNOSIS — M21161 Varus deformity, not elsewhere classified, right knee: Secondary | ICD-10-CM | POA: Diagnosis present

## 2016-03-21 DIAGNOSIS — E119 Type 2 diabetes mellitus without complications: Secondary | ICD-10-CM

## 2016-03-21 DIAGNOSIS — M109 Gout, unspecified: Secondary | ICD-10-CM | POA: Diagnosis not present

## 2016-03-21 DIAGNOSIS — F329 Major depressive disorder, single episode, unspecified: Secondary | ICD-10-CM | POA: Diagnosis present

## 2016-03-21 DIAGNOSIS — M25561 Pain in right knee: Secondary | ICD-10-CM | POA: Diagnosis present

## 2016-03-21 DIAGNOSIS — G4733 Obstructive sleep apnea (adult) (pediatric): Secondary | ICD-10-CM | POA: Diagnosis not present

## 2016-03-21 DIAGNOSIS — Z96651 Presence of right artificial knee joint: Secondary | ICD-10-CM | POA: Diagnosis not present

## 2016-03-21 DIAGNOSIS — I252 Old myocardial infarction: Secondary | ICD-10-CM

## 2016-03-21 DIAGNOSIS — K219 Gastro-esophageal reflux disease without esophagitis: Secondary | ICD-10-CM | POA: Diagnosis present

## 2016-03-21 DIAGNOSIS — Z7901 Long term (current) use of anticoagulants: Secondary | ICD-10-CM | POA: Diagnosis not present

## 2016-03-21 DIAGNOSIS — E1169 Type 2 diabetes mellitus with other specified complication: Secondary | ICD-10-CM | POA: Diagnosis present

## 2016-03-21 DIAGNOSIS — Z471 Aftercare following joint replacement surgery: Secondary | ICD-10-CM | POA: Diagnosis not present

## 2016-03-21 DIAGNOSIS — Z96652 Presence of left artificial knee joint: Secondary | ICD-10-CM | POA: Diagnosis present

## 2016-03-21 DIAGNOSIS — G629 Polyneuropathy, unspecified: Secondary | ICD-10-CM | POA: Diagnosis not present

## 2016-03-21 DIAGNOSIS — C679 Malignant neoplasm of bladder, unspecified: Secondary | ICD-10-CM | POA: Diagnosis present

## 2016-03-21 DIAGNOSIS — Z8551 Personal history of malignant neoplasm of bladder: Secondary | ICD-10-CM | POA: Diagnosis not present

## 2016-03-21 DIAGNOSIS — Z87891 Personal history of nicotine dependence: Secondary | ICD-10-CM

## 2016-03-21 DIAGNOSIS — E785 Hyperlipidemia, unspecified: Secondary | ICD-10-CM | POA: Diagnosis present

## 2016-03-21 DIAGNOSIS — M25569 Pain in unspecified knee: Secondary | ICD-10-CM | POA: Diagnosis not present

## 2016-03-21 DIAGNOSIS — M159 Polyosteoarthritis, unspecified: Secondary | ICD-10-CM | POA: Diagnosis not present

## 2016-03-21 DIAGNOSIS — M1990 Unspecified osteoarthritis, unspecified site: Secondary | ICD-10-CM | POA: Diagnosis not present

## 2016-03-21 DIAGNOSIS — Z6829 Body mass index (BMI) 29.0-29.9, adult: Secondary | ICD-10-CM

## 2016-03-21 DIAGNOSIS — R279 Unspecified lack of coordination: Secondary | ICD-10-CM | POA: Diagnosis not present

## 2016-03-21 DIAGNOSIS — G8918 Other acute postprocedural pain: Secondary | ICD-10-CM | POA: Diagnosis not present

## 2016-03-21 HISTORY — PX: TOTAL KNEE ARTHROPLASTY: SHX125

## 2016-03-21 LAB — GLUCOSE, CAPILLARY
GLUCOSE-CAPILLARY: 149 mg/dL — AB (ref 65–99)
GLUCOSE-CAPILLARY: 121 mg/dL — AB (ref 65–99)
Glucose-Capillary: 148 mg/dL — ABNORMAL HIGH (ref 65–99)

## 2016-03-21 SURGERY — ARTHROPLASTY, KNEE, TOTAL
Anesthesia: Regional | Laterality: Right

## 2016-03-21 MED ORDER — MENTHOL 3 MG MT LOZG: 1.0000 | LOZENGE | OROMUCOSAL | Status: DC | PRN

## 2016-03-21 MED ORDER — MIDAZOLAM HCL 5 MG/5ML IJ SOLN
INTRAMUSCULAR | Status: DC | PRN
  Administered 2016-03-21: 2 mg via INTRAVENOUS

## 2016-03-21 MED ORDER — BUPIVACAINE HCL (PF) 0.5 % IJ SOLN
INTRAMUSCULAR | Status: AC
  Filled 2016-03-21: qty 60

## 2016-03-21 MED ORDER — METOCLOPRAMIDE HCL 5 MG/ML IJ SOLN
5.0000 mg | Freq: Three times a day (TID) | INTRAMUSCULAR | Status: DC | PRN
  Administered 2016-03-22: 10 mg via INTRAVENOUS
  Filled 2016-03-21: qty 2

## 2016-03-21 MED ORDER — NITROGLYCERIN 0.4 MG SL SUBL: 0.4000 mg | SUBLINGUAL_TABLET | SUBLINGUAL | Status: DC | PRN

## 2016-03-21 MED ORDER — PANTOPRAZOLE SODIUM 40 MG PO TBEC
40.0000 mg | DELAYED_RELEASE_TABLET | Freq: Every morning | ORAL | Status: DC
  Administered 2016-03-22 – 2016-03-24 (×3): 40 mg via ORAL
  Filled 2016-03-21 (×3): qty 1

## 2016-03-21 MED ORDER — CHLORHEXIDINE GLUCONATE 4 % EX LIQD: 60.0000 mL | Freq: Once | CUTANEOUS | Status: DC

## 2016-03-21 MED ORDER — INSULIN ASPART 100 UNIT/ML ~~LOC~~ SOLN
0.0000 [IU] | Freq: Three times a day (TID) | SUBCUTANEOUS | Status: DC
  Administered 2016-03-21: 3 [IU] via SUBCUTANEOUS
  Administered 2016-03-22: 4 [IU] via SUBCUTANEOUS
  Administered 2016-03-22 – 2016-03-23 (×2): 3 [IU] via SUBCUTANEOUS
  Administered 2016-03-23 (×2): 4 [IU] via SUBCUTANEOUS
  Administered 2016-03-24: 3 [IU] via SUBCUTANEOUS
  Administered 2016-03-24: 4 [IU] via SUBCUTANEOUS

## 2016-03-21 MED ORDER — DEXAMETHASONE SODIUM PHOSPHATE 10 MG/ML IJ SOLN
INTRAMUSCULAR | Status: AC
  Filled 2016-03-21: qty 1

## 2016-03-21 MED ORDER — ACETAMINOPHEN 500 MG PO TABS
1000.0000 mg | ORAL_TABLET | Freq: Four times a day (QID) | ORAL | Status: AC
  Administered 2016-03-21 – 2016-03-22 (×4): 1000 mg via ORAL
  Filled 2016-03-21 (×4): qty 2

## 2016-03-21 MED ORDER — AMLODIPINE BESYLATE 10 MG PO TABS
10.0000 mg | ORAL_TABLET | Freq: Every day | ORAL | Status: DC
  Administered 2016-03-22 – 2016-03-24 (×3): 10 mg via ORAL
  Filled 2016-03-21 (×3): qty 1

## 2016-03-21 MED ORDER — OXYCODONE HCL 5 MG PO TABS: 5.0000 mg | ORAL_TABLET | Freq: Once | ORAL | Status: DC | PRN

## 2016-03-21 MED ORDER — FENTANYL CITRATE (PF) 100 MCG/2ML IJ SOLN
INTRAMUSCULAR | Status: AC
  Filled 2016-03-21: qty 2

## 2016-03-21 MED ORDER — SODIUM CHLORIDE 0.9 % IR SOLN
Status: DC | PRN
  Administered 2016-03-21: 3000 mL

## 2016-03-21 MED ORDER — EPINEPHRINE PF 1 MG/ML IJ SOLN
INTRAMUSCULAR | Status: AC
  Filled 2016-03-21: qty 1

## 2016-03-21 MED ORDER — ACETAMINOPHEN 325 MG PO TABS
650.0000 mg | ORAL_TABLET | Freq: Four times a day (QID) | ORAL | Status: DC | PRN
  Administered 2016-03-22 – 2016-03-24 (×2): 650 mg via ORAL
  Filled 2016-03-21 (×2): qty 2

## 2016-03-21 MED ORDER — ROPIVACAINE HCL 7.5 MG/ML IJ SOLN
INTRAMUSCULAR | Status: DC | PRN
  Administered 2016-03-21: 20 mL via PERINEURAL

## 2016-03-21 MED ORDER — FLEET ENEMA 7-19 GM/118ML RE ENEM: 1.0000 | ENEMA | Freq: Once | RECTAL | Status: DC | PRN

## 2016-03-21 MED ORDER — HYDROMORPHONE HCL 2 MG/ML IJ SOLN
1.0000 mg | INTRAMUSCULAR | Status: DC | PRN
  Administered 2016-03-21 – 2016-03-24 (×10): 1 mg via INTRAVENOUS
  Filled 2016-03-21 (×10): qty 1

## 2016-03-21 MED ORDER — APIXABAN 2.5 MG PO TABS: 2.5000 mg | ORAL_TABLET | Freq: Two times a day (BID) | ORAL | 0 refills | Status: DC

## 2016-03-21 MED ORDER — BUPIVACAINE LIPOSOME 1.3 % IJ SUSP
20.0000 mL | INTRAMUSCULAR | Status: DC
  Filled 2016-03-21: qty 20

## 2016-03-21 MED ORDER — ACETAMINOPHEN 650 MG RE SUPP
650.0000 mg | Freq: Four times a day (QID) | RECTAL | Status: DC | PRN
Start: 2016-03-21 — End: 2016-03-24

## 2016-03-21 MED ORDER — TRANEXAMIC ACID 1000 MG/10ML IV SOLN
2000.0000 mg | Freq: Once | INTRAVENOUS | Status: DC
  Filled 2016-03-21: qty 20

## 2016-03-21 MED ORDER — HYDROMORPHONE HCL 1 MG/ML IJ SOLN: 0.2500 mg | INTRAMUSCULAR | Status: DC | PRN

## 2016-03-21 MED ORDER — APIXABAN 2.5 MG PO TABS
2.5000 mg | ORAL_TABLET | Freq: Two times a day (BID) | ORAL | Status: DC
  Administered 2016-03-22 – 2016-03-24 (×5): 2.5 mg via ORAL
  Filled 2016-03-21 (×5): qty 1

## 2016-03-21 MED ORDER — BUPIVACAINE IN DEXTROSE 0.75-8.25 % IT SOLN
INTRATHECAL | Status: DC | PRN
  Administered 2016-03-21: 2 mL via INTRATHECAL

## 2016-03-21 MED ORDER — METHOCARBAMOL 750 MG PO TABS
750.0000 mg | ORAL_TABLET | Freq: Four times a day (QID) | ORAL | Status: DC | PRN
  Administered 2016-03-21 – 2016-03-24 (×8): 750 mg via ORAL
  Filled 2016-03-21 (×10): qty 1

## 2016-03-21 MED ORDER — TRANEXAMIC ACID 1000 MG/10ML IV SOLN
INTRAVENOUS | Status: DC | PRN
  Administered 2016-03-21: 2000 mg via TOPICAL

## 2016-03-21 MED ORDER — METOCLOPRAMIDE HCL 5 MG PO TABS: 5.0000 mg | ORAL_TABLET | Freq: Three times a day (TID) | ORAL | Status: DC | PRN

## 2016-03-21 MED ORDER — ROPINIROLE HCL 1 MG PO TABS: 1.0000 mg | ORAL_TABLET | Freq: Every evening | ORAL | Status: DC | PRN

## 2016-03-21 MED ORDER — DIPHENHYDRAMINE HCL 12.5 MG/5ML PO ELIX: 12.5000 mg | ORAL_SOLUTION | ORAL | Status: DC | PRN

## 2016-03-21 MED ORDER — PROPOFOL 10 MG/ML IV BOLUS
INTRAVENOUS | Status: DC | PRN
  Administered 2016-03-21 (×2): 15 mg via INTRAVENOUS

## 2016-03-21 MED ORDER — INSULIN ASPART 100 UNIT/ML ~~LOC~~ SOLN
6.0000 [IU] | Freq: Three times a day (TID) | SUBCUTANEOUS | Status: DC
  Administered 2016-03-21 – 2016-03-24 (×10): 6 [IU] via SUBCUTANEOUS

## 2016-03-21 MED ORDER — ONDANSETRON HCL 4 MG PO TABS: 4.0000 mg | ORAL_TABLET | Freq: Four times a day (QID) | ORAL | Status: DC | PRN

## 2016-03-21 MED ORDER — ONDANSETRON HCL 4 MG/2ML IJ SOLN
4.0000 mg | Freq: Four times a day (QID) | INTRAMUSCULAR | Status: DC | PRN
  Administered 2016-03-21 – 2016-03-24 (×7): 4 mg via INTRAVENOUS
  Filled 2016-03-21 (×7): qty 2

## 2016-03-21 MED ORDER — BUPIVACAINE-EPINEPHRINE 0.5% -1:200000 IJ SOLN
INTRAMUSCULAR | Status: DC | PRN
  Administered 2016-03-21: 50 mL

## 2016-03-21 MED ORDER — METHOCARBAMOL 500 MG PO TABS
ORAL_TABLET | ORAL | Status: AC
  Filled 2016-03-21: qty 2

## 2016-03-21 MED ORDER — METHOCARBAMOL 750 MG PO TABS: 750.0000 mg | ORAL_TABLET | Freq: Four times a day (QID) | ORAL | 0 refills | Status: DC | PRN

## 2016-03-21 MED ORDER — MIDAZOLAM HCL 2 MG/2ML IJ SOLN
INTRAMUSCULAR | Status: AC
  Filled 2016-03-21: qty 2

## 2016-03-21 MED ORDER — SODIUM CHLORIDE 0.9 % IV SOLN: INTRAVENOUS | Status: DC

## 2016-03-21 MED ORDER — FENTANYL CITRATE (PF) 100 MCG/2ML IJ SOLN
INTRAMUSCULAR | Status: DC | PRN
  Administered 2016-03-21: 100 ug via INTRAVENOUS

## 2016-03-21 MED ORDER — DOCUSATE SODIUM 100 MG PO CAPS
100.0000 mg | ORAL_CAPSULE | Freq: Two times a day (BID) | ORAL | Status: DC
  Administered 2016-03-21 – 2016-03-24 (×6): 100 mg via ORAL
  Filled 2016-03-21 (×6): qty 1

## 2016-03-21 MED ORDER — HYDROMORPHONE HCL 2 MG PO TABS
4.0000 mg | ORAL_TABLET | ORAL | Status: DC | PRN
  Administered 2016-03-21 – 2016-03-24 (×10): 4 mg via ORAL
  Filled 2016-03-21 (×10): qty 2

## 2016-03-21 MED ORDER — ISOSORBIDE MONONITRATE ER 60 MG PO TB24
120.0000 mg | ORAL_TABLET | Freq: Every morning | ORAL | Status: DC
  Administered 2016-03-22 – 2016-03-24 (×3): 120 mg via ORAL
  Filled 2016-03-21 (×3): qty 2

## 2016-03-21 MED ORDER — PROPOFOL 500 MG/50ML IV EMUL
INTRAVENOUS | Status: DC | PRN
  Administered 2016-03-21: 100 ug/kg/min via INTRAVENOUS

## 2016-03-21 MED ORDER — CEFAZOLIN SODIUM-DEXTROSE 2-4 GM/100ML-% IV SOLN
2.0000 g | INTRAVENOUS | Status: AC
  Administered 2016-03-21: 2 g via INTRAVENOUS
  Filled 2016-03-21: qty 100

## 2016-03-21 MED ORDER — OXYCODONE HCL 5 MG/5ML PO SOLN: 5.0000 mg | Freq: Once | ORAL | Status: DC | PRN

## 2016-03-21 MED ORDER — LACTATED RINGERS IV SOLN
INTRAVENOUS | Status: DC
  Administered 2016-03-21 (×2): via INTRAVENOUS

## 2016-03-21 MED ORDER — PHENOL 1.4 % MT LIQD
1.0000 | OROMUCOSAL | Status: DC | PRN
Start: 2016-03-21 — End: 2016-03-24

## 2016-03-21 MED ORDER — HYDROMORPHONE HCL 4 MG PO TABS: 4.0000 mg | ORAL_TABLET | ORAL | 0 refills | Status: DC | PRN

## 2016-03-21 MED ORDER — CELECOXIB 200 MG PO CAPS
200.0000 mg | ORAL_CAPSULE | Freq: Two times a day (BID) | ORAL | Status: DC
  Administered 2016-03-21 – 2016-03-24 (×6): 200 mg via ORAL
  Filled 2016-03-21 (×6): qty 1

## 2016-03-21 MED ORDER — DEXTROSE 5 % IV SOLN
INTRAVENOUS | Status: DC | PRN
  Administered 2016-03-21: 10 ug/min via INTRAVENOUS

## 2016-03-21 MED ORDER — CEFAZOLIN IN D5W 1 GM/50ML IV SOLN
1.0000 g | Freq: Four times a day (QID) | INTRAVENOUS | Status: AC
  Administered 2016-03-21 (×2): 1 g via INTRAVENOUS
  Filled 2016-03-21 (×2): qty 50

## 2016-03-21 MED ORDER — TRANEXAMIC ACID 1000 MG/10ML IV SOLN
1000.0000 mg | Freq: Once | INTRAVENOUS | Status: AC
  Administered 2016-03-21: 1000 mg via INTRAVENOUS
  Filled 2016-03-21: qty 10

## 2016-03-21 MED ORDER — GABAPENTIN 300 MG PO CAPS
300.0000 mg | ORAL_CAPSULE | Freq: Once | ORAL | Status: AC
  Administered 2016-03-21: 300 mg via ORAL
  Filled 2016-03-21: qty 1

## 2016-03-21 MED ORDER — SODIUM CHLORIDE 0.9 % IV SOLN
INTRAVENOUS | Status: DC
  Administered 2016-03-23: 06:00:00 via INTRAVENOUS

## 2016-03-21 MED ORDER — INSULIN ASPART 100 UNIT/ML ~~LOC~~ SOLN: 0.0000 [IU] | Freq: Every day | SUBCUTANEOUS | Status: DC

## 2016-03-21 MED ORDER — BISACODYL 5 MG PO TBEC: 5.0000 mg | DELAYED_RELEASE_TABLET | Freq: Every day | ORAL | Status: DC | PRN

## 2016-03-21 MED ORDER — POLYETHYLENE GLYCOL 3350 17 G PO PACK: 17.0000 g | PACK | Freq: Every day | ORAL | Status: DC | PRN

## 2016-03-21 MED ORDER — ACETAMINOPHEN 500 MG PO TABS
1000.0000 mg | ORAL_TABLET | Freq: Once | ORAL | Status: AC
  Administered 2016-03-21: 1000 mg via ORAL
  Filled 2016-03-21: qty 2

## 2016-03-21 MED ORDER — BUPIVACAINE LIPOSOME 1.3 % IJ SUSP
INTRAMUSCULAR | Status: DC | PRN
  Administered 2016-03-21: 70 mL

## 2016-03-21 MED ORDER — METOPROLOL SUCCINATE ER 50 MG PO TB24
50.0000 mg | ORAL_TABLET | Freq: Every day | ORAL | Status: DC
  Administered 2016-03-22 – 2016-03-24 (×3): 50 mg via ORAL
  Filled 2016-03-21 (×3): qty 1

## 2016-03-21 MED ORDER — ROSUVASTATIN CALCIUM 10 MG PO TABS
10.0000 mg | ORAL_TABLET | ORAL | Status: DC
  Administered 2016-03-22 – 2016-03-24 (×2): 10 mg via ORAL
  Filled 2016-03-21 (×2): qty 1

## 2016-03-21 MED ORDER — GABAPENTIN 300 MG PO CAPS
300.0000 mg | ORAL_CAPSULE | Freq: Three times a day (TID) | ORAL | Status: DC
  Administered 2016-03-21 – 2016-03-24 (×10): 300 mg via ORAL
  Filled 2016-03-21 (×10): qty 1

## 2016-03-21 MED ORDER — 0.9 % SODIUM CHLORIDE (POUR BTL) OPTIME
TOPICAL | Status: DC | PRN
  Administered 2016-03-21: 1000 mL

## 2016-03-21 SURGICAL SUPPLY — 58 items
BAG DECANTER FOR FLEXI CONT (MISCELLANEOUS) ×2 IMPLANT
BANDAGE ACE 4X5 VEL STRL LF (GAUZE/BANDAGES/DRESSINGS) ×2 IMPLANT
BANDAGE ACE 6X5 VEL STRL LF (GAUZE/BANDAGES/DRESSINGS) ×2 IMPLANT
BANDAGE ESMARK 6X9 LF (GAUZE/BANDAGES/DRESSINGS) ×1 IMPLANT
BLADE SAGITTAL 25.0X1.19X90 (BLADE) ×2 IMPLANT
BLADE SAW SAG 90X13X1.27 (BLADE) ×2 IMPLANT
BNDG ESMARK 6X9 LF (GAUZE/BANDAGES/DRESSINGS) ×2
BONE CEMENT GENTAMICIN (Cement) ×4 IMPLANT
CAP KNEE TOTAL 3 SIGMA ×2 IMPLANT
CEMENT BONE GENTAMICIN 40 (Cement) ×2 IMPLANT
COVER SURGICAL LIGHT HANDLE (MISCELLANEOUS) ×2 IMPLANT
CUFF TOURNIQUET SINGLE 34IN LL (TOURNIQUET CUFF) ×2 IMPLANT
DRAPE INCISE IOBAN 66X45 STRL (DRAPES) ×2 IMPLANT
DRAPE ORTHO SPLIT 77X108 STRL (DRAPES) ×2
DRAPE SURG ORHT 6 SPLT 77X108 (DRAPES) ×2 IMPLANT
DRAPE U-SHAPE 47X51 STRL (DRAPES) ×2 IMPLANT
DRSG ADAPTIC 3X8 NADH LF (GAUZE/BANDAGES/DRESSINGS) ×2 IMPLANT
DRSG PAD ABDOMINAL 8X10 ST (GAUZE/BANDAGES/DRESSINGS) ×4 IMPLANT
DURAPREP 26ML APPLICATOR (WOUND CARE) ×2 IMPLANT
ELECT REM PT RETURN 9FT ADLT (ELECTROSURGICAL) ×2
ELECTRODE REM PT RTRN 9FT ADLT (ELECTROSURGICAL) ×1 IMPLANT
FACESHIELD WRAPAROUND (MASK) ×4 IMPLANT
GAUZE SPONGE 4X4 12PLY STRL (GAUZE/BANDAGES/DRESSINGS) ×2 IMPLANT
GLOVE BIOGEL PI IND STRL 8 (GLOVE) ×2 IMPLANT
GLOVE BIOGEL PI INDICATOR 8 (GLOVE) ×2
GLOVE ORTHO TXT STRL SZ7.5 (GLOVE) ×2 IMPLANT
GLOVE SURG ORTHO 8.0 STRL STRW (GLOVE) ×2 IMPLANT
GOWN STRL REUS W/ TWL LRG LVL3 (GOWN DISPOSABLE) ×1 IMPLANT
GOWN STRL REUS W/ TWL XL LVL3 (GOWN DISPOSABLE) ×1 IMPLANT
GOWN STRL REUS W/TWL 2XL LVL3 (GOWN DISPOSABLE) ×2 IMPLANT
GOWN STRL REUS W/TWL LRG LVL3 (GOWN DISPOSABLE) ×1
GOWN STRL REUS W/TWL XL LVL3 (GOWN DISPOSABLE) ×1
HANDPIECE INTERPULSE COAX TIP (DISPOSABLE) ×1
HOOD PEEL AWAY FACE SHEILD DIS (HOOD) ×2 IMPLANT
IMMOBILIZER KNEE 22 UNIV (SOFTGOODS) ×2 IMPLANT
KIT BASIN OR (CUSTOM PROCEDURE TRAY) ×2 IMPLANT
KIT ROOM TURNOVER OR (KITS) ×2 IMPLANT
MANIFOLD NEPTUNE II (INSTRUMENTS) ×2 IMPLANT
NEEDLE 22X1 1/2 (OR ONLY) (NEEDLE) ×4 IMPLANT
NS IRRIG 1000ML POUR BTL (IV SOLUTION) ×2 IMPLANT
PACK TOTAL JOINT (CUSTOM PROCEDURE TRAY) ×2 IMPLANT
PAD ARMBOARD 7.5X6 YLW CONV (MISCELLANEOUS) ×2 IMPLANT
PAD CAST 4YDX4 CTTN HI CHSV (CAST SUPPLIES) ×1 IMPLANT
PADDING CAST COTTON 4X4 STRL (CAST SUPPLIES) ×1
PADDING CAST COTTON 6X4 STRL (CAST SUPPLIES) ×2 IMPLANT
SET HNDPC FAN SPRY TIP SCT (DISPOSABLE) ×1 IMPLANT
STAPLER VISISTAT 35W (STAPLE) ×2 IMPLANT
SUCTION FRAZIER HANDLE 10FR (MISCELLANEOUS) ×1
SUCTION TUBE FRAZIER 10FR DISP (MISCELLANEOUS) ×1 IMPLANT
SUT ETHIBOND NAB CT1 #1 30IN (SUTURE) ×6 IMPLANT
SUT VIC AB 0 CT1 27 (SUTURE) ×1
SUT VIC AB 0 CT1 27XBRD ANBCTR (SUTURE) ×1 IMPLANT
SUT VIC AB 2-0 CT1 27 (SUTURE) ×2
SUT VIC AB 2-0 CT1 TAPERPNT 27 (SUTURE) ×2 IMPLANT
SYR CONTROL 10ML LL (SYRINGE) ×2 IMPLANT
TOWEL OR 17X24 6PK STRL BLUE (TOWEL DISPOSABLE) ×2 IMPLANT
TOWEL OR 17X26 10 PK STRL BLUE (TOWEL DISPOSABLE) ×2 IMPLANT
TRAY CATH 16FR W/PLASTIC CATH (SET/KITS/TRAYS/PACK) ×2 IMPLANT

## 2016-03-21 NOTE — Anesthesia Postprocedure Evaluation (Signed)
Anesthesia Post Note  Patient: Isaac [name]  Procedure(s) Performed: Procedure(s) (LRB): TOTAL KNEE ARTHROPLASTY (Right)  Patient location during evaluation: PACU Anesthesia Type: Regional and Spinal Level of consciousness: awake Pain management: pain level controlled Respiratory status: spontaneous breathing Cardiovascular status: stable Postop Assessment: spinal receding and no signs of nausea or vomiting Anesthetic complications: no    Last Vitals:  Vitals:   03/21/16 1402 03/21/16 1430  BP: 112/87 110/78  Pulse: 63 61  Resp: 20 13  Temp:      Last Pain:  Vitals:   03/21/16 1004  TempSrc: Oral                 [name]

## 2016-03-21 NOTE — Anesthesia Procedure Notes (Addendum)
Anesthesia Regional Block:  Femoral nerve block  Pre-Anesthetic Checklist: ,, timeout performed, Correct Patient, Correct Site, Correct Laterality, Correct Procedure, Correct Position, site marked, Risks and benefits discussed,  Surgical consent,  Pre-op evaluation,  At surgeon's request and post-op pain management  Laterality: Lower and Right  Prep: chloraprep       Needles:  Injection technique: Single-shot  Needle Type: Echogenic Stimulator Needle          Additional Needles:  Procedures: ultrasound guided (picture in chart) and nerve stimulator Femoral nerve block  Nerve Stimulator or Paresthesia:  Response: quad, 0.5 mA,   Additional Responses:   Narrative:  Start time: 03/21/2016 10:41 AM End time: 03/21/2016 10:45 AM Injection made incrementally with aspirations every 5 mL.  Performed by: Personally  Anesthesiologist: Roux Brandy  Additional Notes: H+P and labs reviewed, risks and benefits discussed with patient, procedure tolerated well without complications

## 2016-03-21 NOTE — Op Note (Signed)
NAME:  YOVAN, LEEMAN NO.:  [id number]  MEDICAL RECORD NO.:  [id number]  LOCATION:  [facility]                         FACILITY:  [facility]  PHYSICIAN:  Lockie Pares, M.D.    DATE OF BIRTH:  23-May-1955  DATE OF PROCEDURE:  03/21/2016 DATE OF DISCHARGE:                              OPERATIVE REPORT   PREOPERATIVE DIAGNOSIS:  Severe osteoarthritis, right knee with varus deformity.  POSTOPERATIVE DIAGNOSIS:  Severe osteoarthritis, right knee with varus deformity.  OPERATION:  Right total knee replacement (Sigma size 4 femur, 12.5 mm bearing with size 5 tibia and 12.5 mm bearing and 38 mm all poly patella).  SURGEON:  Lockie Pares, M.D.  ASSISTANT:  Chriss Czar, PA-C.  ANESTHESIA:  Spinal with femoral nerve block, local supplementation.  DESCRIPTION OF PROCEDURE:  Supine position, exsanguination of leg, inflation to 350.  We approached with medial parapatellar approach to the knee.  We did a 5-degree 11 mm distal valgus cut on the femur.  We then cut about 3 to 4 mm below the most diseased medial compartment with a varus deformity.  Medial stripping was carried on the MCL due to the varus deformity.  Extension gap was measured at 12.5 mm.  We then sized the femur to be a size 4.  Followed that by placing the all in one cutting block with appropriate degree of external rotation for the anterior, posterior,chamber cuts.  PCL was released, excess menisci, posterior stripping, and removal of some small osteophytes in the posterior aspect of the femur was noted as well.  Flexion gap equaled the extension gap of 12.5 mm.  Sized the tibia to be a size 5 followed by placement of the keel for the trial.  Trial base plate was placed followed by box cut on the femur.  The patient had a mild flexion contracture and varus deformity preoperatively resolved to full extension with the femoral tibial trial in.  There is no tendency for imbalance as well.  The patella was cut  leaving about 17 mm of native patella for 38 mm all poly patella trial.  Trial components were inserted and all parameters, range of motion, balancing, etc., would be acceptable.  Final components were inserted with antibiotic impregnated cement in the doughy state, tibia followed by femur patella.  We used a trial bearing while the cement hardened.  The trial bearing was removed. Excess cement was removed from the posterior aspect of the knee.  The tourniquet was released with the trial bearing out.  No excessive bleeding was noted.  We placed a mixture of Marcaine with epinephrine and Exparel into the capsule, subcutaneous tissues.  Tourniquet was released prior to placement of the final 12.5 mm bearing.  Closure was affected with #1 Ethibond, 2-0 Vicryl, and skin clips.  Lightly compressive sterile dressing and knee immobilizer were applied.  Taken to the recovery room in a stable condition.     Lockie Pares, M.D.     WDC/MEDQ  D:  03/21/2016  T:  03/21/2016  Job:  [id number]

## 2016-03-21 NOTE — H&P (View-Only) (Signed)
TOTAL KNEE ADMISSION H&P  Patient is being admitted for right total knee arthroplasty.  Subjective:  Chief Complaint:right knee pain.  HPI: Isaac [name], 60 y.o. male, has a history of pain and functional disability in the right knee due to arthritis and has failed non-surgical conservative treatments for greater than 12 weeks to includeNSAID's and/or analgesics, corticosteriod injections, viscosupplementation injections, use of assistive devices and activity modification.  Onset of symptoms was gradual, starting >10 years ago with gradually worsening course since that time. The patient noted prior procedures on the knee to include  arthroscopy, menisectomy and microfracture on the right knee(s).  Patient currently rates pain in the right knee(s) at 10 out of 10 with activity. Patient has night pain, worsening of pain with activity and weight bearing, pain that interferes with activities of daily living, pain with passive range of motion, crepitus and joint swelling.  Patient has evidence of periarticular osteophytes and joint space narrowing by imaging studies.There is no active infection.  Patient Active Problem List   Diagnosis Date Noted  . Gout 10/22/2015  . Primary localized osteoarthritis of left knee 05/18/2015  . Personal history of colonic polyps 09/07/2013  . DM (diabetes mellitus) type 2, uncontrolled, with ketoacidosis ([place]) 06/19/2011  . Anxiety and depression 06/19/2011  . History of skin cancer 06/19/2011  . BLADDER CANCER , UNSPEC. 05/10/2008  . Hyperlipidemia 05/10/2008  . CAD, NATIVE VESSEL 05/10/2008  . RESTLESS LEGS SYNDROME 11/17/2007  . Essential hypertension 11/16/2007  . OSA (obstructive sleep apnea) 11/16/2007   Past Medical History:  Diagnosis Date  . Arthritis   . Bladder cancer [facility]) followed by dr Risa Grill  . Bladder cancer ([place])   . Coronary artery disease    followed by Dr. [name]; Lexiscan Myoview (10/14):  Low risk, EF 57%, small inf infarct   . Coronary vasospasm ([place]) PER DR ROSS NOTE 03-22-2012--  INTER. TIGHTNESS   PER PT PROBABLE FROM STRESS  . Depression    no rx  . Diabetes mellitus    followed by Dr. Dwyane Dee  . Dyslipidemia    pt unable to tolerate niaspan  . GERD (gastroesophageal reflux disease)   . History of angina CHRONIC -- CONTROLLED W/ IMDUR  . 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 History of non-ST elevation myocardial infarction (NSTEMI) 2005  . Hypertension   . OSA on CPAP   . Peripheral neuropathy ([facility]) RIGHT LEG NUMBNESS    Past Surgical History:  Procedure Laterality Date  . APPENDECTOMY  08-03-2004  . CARDIAC CATHETERIZATION  x3  last one 06-17-2007   MILD NON-OBSTRUCTIVE CAD/ NORMAL LVF/ 30% LEFT RENAL ARTERY STENOSIS  . CYSTO/ BILATERAL RETROGRADE PYELOGRAM/ RIGHT URETEROSCOPIC LASER FULGURATION URETERAL TUMOR  02-16-2008  . CYSTO/ BLADDER BX  09-13-2008;  08-13-2007; 04-19-2007; 06-01-2006  . CYSTO/ CYSTOGRAM/ URETEROSCOPY  02-21-2009  . CYSTO/ RESECTION BLADDER TUMOR/ LEFT RETROGRADE PYELOGRAM/ RIGHT URETEROSCOPY  08-07-2010   TCC OF BLADDER   S/P DISTAL URETERECTOMY/ REIMPLANTATION  . CYSTO/ RIGHT URETEROSCOPY/ BX URETERAL TUMOR  10-11-2008  . CYSTOSCOPY W/ RETROGRADES  04/19/2012   Procedure: CYSTOSCOPY WITH RETROGRADE PYELOGRAM;  Surgeon: Bernestine Amass, MD;  Location[facility];  Service: Urology;  Laterality: Bilateral;  30 MINS Cysto, Biopsy, possible TURBT, Bilateral retrograde pyelograms with Mitomycin C instilation Post op   . CYSTOSCOPY WITH BIOPSY  04/07/2011   Procedure: CYSTOSCOPY WITH BIOPSY/ RIGHT URETEROSCOPY;  Surgeon: Bernestine Amass, MD;  Location: [facility];  Service: Urology;  Laterality: N/A;    cystoscopy, cystogram, biopsy and fulgeration  . CYSTOSCOPY WITH BIOPSY  12/29/2011   Procedure: CYSTOSCOPY WITH BIOPSY;  Surgeon: Bernestine Amass, MD;  [facility];  Service:  Urology;  Laterality: N/A;  [place]    . ELBOW SURGERY  07-07-2003   RIGHT ELBOW ARTHROSCOPY W/ OPEN RECONSTRUCTION  . KNEE ARTHROSCOPY W/ DEBRIDEMENT  02-08-2004   RIGHT KNEE  . RHINOPLASTY W/ MODIFIED CARTILAGE GRAFT  05-17-2007   INTERNAL NASAL VALVE COLLAPSE/ OSA/ SEPTAL PERFERATION  . RIGHT DISTAL URETERECTOMY W/ REIMPLANTATION  10-30-2008   TCC OF RIGHT DISTAL URETER  . SHOULDER SURGERY  2005   RIGHT ROTATOR CUFF REPAIR  . TOTAL KNEE ARTHROPLASTY Left 05/18/2015  . TOTAL KNEE ARTHROPLASTY Left 05/18/2015   Procedure: TOTAL KNEE ARTHROPLASTY;  Surgeon: Earlie Server, MD;  Location: [facility];  Service: Orthopedics;  Laterality: Left;  . TRANSURETHRAL RESECTION OF BLADDER TUMOR  01-13-2007  . TRANSURETHRAL RESECTION OF BLADDER TUMOR  04/19/2012   Procedure: TRANSURETHRAL RESECTION OF BLADDER TUMOR (TURBT);  Surgeon: Bernestine Amass, MD;  Location: [facility];  Service: Urology;  Laterality: N/A;  . umbillical hernia repair  2004     (Not in a hospital admission) No Known Allergies  Social History  Substance Use Topics  . Smoking status: Former Smoker    Packs/day: 0.50    Years: 20.00    Types: Cigarettes    Quit date: 05/13/2007  . Smokeless tobacco: Never Used  . Alcohol use No    Family History  Problem Relation Age of Onset  . Coronary artery disease Father   . Diabetes Maternal Grandmother   . Heart disease Paternal Grandfather   . Colon cancer Neg Hx   . Stomach cancer Neg Hx      Review of Systems  HENT: Positive for tinnitus.   Respiratory: Positive for shortness of breath.   Cardiovascular: Positive for chest pain.  Gastrointestinal: Positive for constipation and diarrhea.  Genitourinary: Positive for hematuria.  Musculoskeletal: Positive for joint pain.  Neurological: Positive for dizziness.  All other systems reviewed and are negative.   Objective:  Physical Exam  Constitutional: He is oriented to person, place, and  time. He appears well-developed and well-nourished. No distress.  HENT:  Head: Normocephalic and atraumatic.  Nose: Nose normal.  Eyes: Conjunctivae and EOM are normal. Pupils are equal, round, and reactive to light.  Neck: Normal range of motion. Neck supple.  Cardiovascular: Normal rate, regular rhythm, normal heart sounds and intact distal pulses.   Respiratory: Effort normal and breath sounds normal. No respiratory distress. He has no wheezes.  GI: Soft. Bowel sounds are normal. He exhibits no distension. There is no tenderness.  Musculoskeletal:       Right knee: He exhibits swelling. He exhibits no LCL laxity and no MCL laxity. Tenderness found.  Lymphadenopathy:    He has no cervical adenopathy.  Neurological: He is alert and oriented to person, place, and time. No cranial nerve deficit.  Skin: Skin is warm and dry. No rash noted. No erythema.  Psychiatric: He has a normal mood and affect. His behavior is normal.    Vital signs in last 24 hours: @VSRANGES @  Labs:   Estimated body mass index is 29.67 kg/m as calculated from the following:   Height as of 02/20/16: 5\' 10"  (1.778 m).   Weight as of 02/20/16: 93.8 kg (206 lb 12.8 oz).  Imaging Review Plain radiographs demonstrate moderate degenerative joint disease of the right knee(s). The overall alignment ismild varus. The bone quality appears to be good for age and reported activity level.  Assessment/Plan:  End stage arthritis, right knee   The patient history, physical examination, clinical judgment of the provider and imaging studies are consistent with end stage degenerative joint disease of the right knee(s) and total knee arthroplasty is deemed medically necessary. The treatment options including medical management, injection therapy arthroscopy and arthroplasty were discussed at length. The risks and benefits of total knee arthroplasty were presented and reviewed. The risks due to aseptic loosening, infection,  stiffness, patella tracking problems, thromboembolic complications and other imponderables were discussed. The patient acknowledged the explanation, agreed to proceed with the plan and consent was signed. Patient is being admitted for inpatient treatment for surgery, pain control, PT, OT, prophylactic antibiotics, VTE prophylaxis, progressive ambulation and ADL's and discharge planning. The patient is planning to be discharged to skilled [place].

## 2016-03-21 NOTE — Anesthesia Preprocedure Evaluation (Signed)
Anesthesia Evaluation  Patient identified by MRN, date of birth, ID band Patient awake    Reviewed: Allergy & Precautions, NPO status , Patient's Chart, lab work & pertinent test results  History of Anesthesia Complications Negative for: history of anesthetic complications  Airway Mallampati: II  TM Distance: >3 FB Neck ROM: Full    Dental  (+) Teeth Intact   Pulmonary neg shortness of breath, sleep apnea and Continuous Positive Airway Pressure Ventilation , neg COPD, former smoker,    breath sounds clear to auscultation       Cardiovascular hypertension, Pt. on medications and Pt. on home beta blockers + CAD and + Past MI   Rhythm:Regular     Neuro/Psych PSYCHIATRIC DISORDERS Depression  Neuromuscular disease    GI/Hepatic GERD  Medicated and Controlled,  Endo/Other  diabetes, Type 2, Oral Hypoglycemic Agents  Renal/GU      Musculoskeletal  (+) Arthritis ,   Abdominal   Peds  Hematology negative hematology ROS (+)   Anesthesia Other Findings   Reproductive/Obstetrics                             Anesthesia Physical Anesthesia Plan  ASA: III  Anesthesia Plan: Spinal   Post-op Pain Management:  Regional for Post-op pain   Induction:   Airway Management Planned: Natural Airway, Nasal Cannula and Simple Face Mask  Additional Equipment: None  Intra-op Plan:   Post-operative Plan:   Informed Consent: I have reviewed the patients History and Physical, chart, labs and discussed the procedure including the risks, benefits and alternatives for the proposed anesthesia with the patient or authorized representative who has indicated his/her understanding and acceptance.   Dental advisory given  Plan Discussed with: CRNA and Surgeon  Anesthesia Plan Comments:         Anesthesia Quick Evaluation

## 2016-03-21 NOTE — Transfer of Care (Signed)
Immediate Anesthesia Transfer of Care Note  Patient: Isaac [name]  Procedure(s) Performed: Procedure(s): TOTAL KNEE ARTHROPLASTY (Right)  Patient Location: PACU  Anesthesia Type:Spinal  Level of Consciousness: awake and alert   Airway & Oxygen Therapy: Patient Spontanous Breathing  Post-op Assessment: Report given to RN and Post -op Vital signs reviewed and stable  Post vital signs: Reviewed and stable  Last Vitals:  Vitals:   03/21/16 1004  BP: 138/86  Pulse: 77  Resp: 18  Temp: 36.8 C    Last Pain:  Vitals:   03/21/16 1004  TempSrc: Oral         Complications: No apparent anesthesia complications

## 2016-03-21 NOTE — Anesthesia Procedure Notes (Addendum)
Spinal  Patient location during procedure: OR Start time: 03/21/2016 10:37 AM End time: 03/21/2016 10:40 AM Staffing Anesthesiologist: Oleta Mouse Preanesthetic Checklist Completed: patient identified, surgical consent, pre-op evaluation, timeout performed, IV checked, risks and benefits discussed and monitors and equipment checked Spinal Block Patient position: sitting Prep: site prepped and draped and DuraPrep Patient monitoring: heart rate, cardiac monitor, continuous pulse ox and blood pressure Approach: midline Location: L3-4 Injection technique: single-shot Needle Needle type: Pencan  Needle gauge: 24 G Needle length: 10 cm Assessment Sensory level: T6

## 2016-03-21 NOTE — Brief Op Note (Signed)
03/21/2016  12:22 PM  PATIENT:  Isaac [name]  60 y.o. male  PRE-OPERATIVE DIAGNOSIS:  OA RIGHT KNEE  POST-OPERATIVE DIAGNOSIS:  OA RIGHT KNEE  PROCEDURE:  Procedure(s): TOTAL KNEE ARTHROPLASTY (Right)  SURGEON:  Surgeon(s) and Role:    * Earlie Server, MD - Primary  PHYSICIAN ASSISTANT: Chriss Czar, PA-C  ASSISTANTS:    ANESTHESIA:   local, regional, spinal and IV sedation  EBL:  No intake/output data recorded.  BLOOD ADMINISTERED:none  DRAINS: none   LOCAL MEDICATIONS USED:  MARCAINE     SPECIMEN:  No Specimen  DISPOSITION OF SPECIMEN:  N/A  COUNTS:  YES  TOURNIQUET:  * Missing tourniquet times found for documented tourniquets in log:  [id number] *  DICTATION: .Other Dictation: Dictation Number unknown  PLAN OF CARE: Admit to inpatient   PATIENT DISPOSITION:  PACU - hemodynamically stable.   Delay start of Pharmacological VTE agent (>24hrs) due to surgical blood loss or risk of bleeding: yes

## 2016-03-21 NOTE — Interval H&P Note (Signed)
History and Physical Interval Note:  03/21/2016 9:38 AM  Isaac [name]  has presented today for surgery, with the diagnosis of OA RIGHT KNEE  The various methods of treatment have been discussed with the patient and family. After consideration of risks, benefits and other options for treatment, the patient has consented to  Procedure(s): TOTAL KNEE ARTHROPLASTY (Right) as a surgical intervention .  The patient's history has been reviewed, patient examined, no change in status, stable for surgery.  I have reviewed the patient's chart and labs.  Questions were answered to the patient's satisfaction.     Isaac [name],Isaac [name]

## 2016-03-21 NOTE — Progress Notes (Signed)
Orthopedic Tech Progress Note Patient Details:  Isaac [name] [birth year] [id number]  CPM Right Knee CPM Right Knee: On Right Knee Flexion (Degrees): 90 Right Knee Extension (Degrees): 0 Additional Comments: out fo Bone Foam   [name] 03/21/2016, 1:57 PM

## 2016-03-22 ENCOUNTER — Encounter (HOSPITAL_COMMUNITY): Payer: Self-pay | Admitting: Physician Assistant

## 2016-03-22 DIAGNOSIS — G4733 Obstructive sleep apnea (adult) (pediatric): Secondary | ICD-10-CM

## 2016-03-22 DIAGNOSIS — T451X5A Adverse effect of antineoplastic and immunosuppressive drugs, initial encounter: Secondary | ICD-10-CM

## 2016-03-22 DIAGNOSIS — G629 Polyneuropathy, unspecified: Secondary | ICD-10-CM

## 2016-03-22 DIAGNOSIS — E1169 Type 2 diabetes mellitus with other specified complication: Secondary | ICD-10-CM | POA: Diagnosis present

## 2016-03-22 DIAGNOSIS — C679 Malignant neoplasm of bladder, unspecified: Secondary | ICD-10-CM | POA: Diagnosis present

## 2016-03-22 DIAGNOSIS — K219 Gastro-esophageal reflux disease without esophagitis: Secondary | ICD-10-CM | POA: Diagnosis present

## 2016-03-22 DIAGNOSIS — E119 Type 2 diabetes mellitus without complications: Secondary | ICD-10-CM

## 2016-03-22 DIAGNOSIS — E669 Obesity, unspecified: Secondary | ICD-10-CM | POA: Diagnosis present

## 2016-03-22 DIAGNOSIS — Z9989 Dependence on other enabling machines and devices: Secondary | ICD-10-CM

## 2016-03-22 LAB — GLUCOSE, CAPILLARY
GLUCOSE-CAPILLARY: 146 mg/dL — AB (ref 65–99)
Glucose-Capillary: 152 mg/dL — ABNORMAL HIGH (ref 65–99)
Glucose-Capillary: 154 mg/dL — ABNORMAL HIGH (ref 65–99)

## 2016-03-22 LAB — BASIC METABOLIC PANEL
Anion gap: 8 (ref 5–15)
BUN: 15 mg/dL (ref 6–20)
CO2: 27 mmol/L (ref 22–32)
CREATININE: 1.2 mg/dL (ref 0.61–1.24)
Calcium: 8.8 mg/dL — ABNORMAL LOW (ref 8.9–10.3)
Chloride: 101 mmol/L (ref 101–111)
GFR calc Af Amer: >60 mL/min (ref >60)
Glucose, Bld: 144 mg/dL — ABNORMAL HIGH (ref 65–99)
Potassium: 4.4 mmol/L (ref 3.5–5.1)
SODIUM: 136 mmol/L (ref 135–145)

## 2016-03-22 LAB — CBC
HCT: 41.7 % (ref 39.0–52.0)
Hemoglobin: 13.3 g/dL (ref 13.0–17.0)
MCH: 29.2 pg (ref 26.0–34.0)
MCHC: 31.9 g/dL (ref 30.0–36.0)
MCV: 91.4 fL (ref 78.0–100.0)
PLATELETS: 128 10*3/uL — AB (ref 150–400)
RBC: 4.56 MIL/uL (ref 4.22–5.81)
RDW: 14.3 % (ref 11.5–15.5)
WBC: 6.1 10*3/uL (ref 4.0–10.5)

## 2016-03-22 MED ORDER — PNEUMOCOCCAL VAC POLYVALENT 25 MCG/0.5ML IJ INJ
0.5000 mL | INJECTION | INTRAMUSCULAR | Status: AC
  Administered 2016-03-23: 0.5 mL via INTRAMUSCULAR
  Filled 2016-03-22: qty 0.5

## 2016-03-22 NOTE — Progress Notes (Signed)
Subjective: 1 Day Post-Op Procedure(s) (LRB): TOTAL KNEE ARTHROPLASTY (Right) Patient reports pain as 4 on 0-10 scale and 8 on 0-10 scale.    Objective: Vital signs in last 24 hours: Temp:  [97.6 F (36.4 C)-98 F (36.7 C)] 97.6 F (36.4 C) (11/11 0545) Pulse Rate:  [55-120] 74 (11/11 0545) Resp:  [12-20] 16 (11/10 1700) BP: (106-133)/(71-97) 132/82 (11/11 0545) SpO2:  [94 %-99 %] 96 % (11/11 0545)  Intake/Output from previous day: 11/10 0701 - 11/11 0700 In: 2360 [P.O.:1200; I.V.:1050; IV Piggyback:110] Out: 2350 [Urine:2300; Blood:50] Intake/Output this shift: Total I/O In: 240 [P.O.:240] Out: 800 [Urine:800]   Recent Labs  03/22/16 0604  HGB 13.3    Recent Labs  03/22/16 0604  WBC 6.1  RBC 4.56  HCT 41.7  PLT 128*    Recent Labs  03/22/16 0604  NA 136  K 4.4  CL 101  CO2 27  BUN 15  CREATININE 1.20  GLUCOSE 144*  CALCIUM 8.8*   No results for input(s): LABPT, INR in the last 72 hours.  ABD soft Neurovascular intact Sensation intact distally Intact pulses distally Dorsiflexion/Plantar flexion intact Incision: dressing C/D/I  Assessment/Plan: 1 Day Post-Op Procedure(s) (LRB): TOTAL KNEE ARTHROPLASTY (Right)  Principal Problem:   Primary localized osteoarthritis of right knee Active Problems:   Essential hypertension   Peripheral neuropathy (HCC)   OSA on CPAP   History of non-ST elevation myocardial infarction (NSTEMI)   GERD (gastroesophageal reflux disease)   Diabetes ([place])   Type 2 diabetes mellitus ([place])   Diabetes mellitus type 2 in obese [facility])   Bladder cancer ([place])  Advance diet Up with therapy Discharge to SNF tomorrow or Monday depending on availability.  Patient lives alone  Linda Hedges 03/22/2016, 10:19 AM

## 2016-03-22 NOTE — Progress Notes (Signed)
Orthopedic Tech Progress Note Patient Details:  Isaac [name] 07/08/1955 [id number]  Patient ID: Isaac [name], male   DOB: 09/06/1955, 60 y.o.   MRN: [id number] Applied cpm 0-55  Karolee Stamps 03/22/2016, 6:24 AM

## 2016-03-22 NOTE — Progress Notes (Signed)
Physical Therapy Evaluation Patient Details Name: Isaac [name] MRN: [id number] DOB: 1955/06/14 Today's Date: 03/22/2016   History of Present Illness  Patient is S/P right TKA on 03/21/2016. At this time he is having increased pain. Patients knee was propped on a pillow for extension by the PA which was causing pain. Therapy explained the importance of regaining extension. He has a history of left TKA in January of 2017. Pertinent PMH:   Clinical Impression  Patient presents with expected limitations of strength and range of motion. His gait distances was limited by pain. He has decreased caregiver assist at home. He would benefit from rehab at a SNF. [facility] acute therapy will see him BID.     Follow Up Recommendations SNF    Equipment Recommendations  None recommended by PT    Recommendations for Other Services       Precautions / Restrictions Precautions Precautions: Knee;Fall Precaution Booklet Issued: Yes (comment) Precaution Comments: Patient given knee packet  Required Braces or Orthoses: Knee Immobilizer - Right Restrictions Weight Bearing Restrictions: Yes RLE Weight Bearing: Weight bearing as tolerated      Mobility  Bed Mobility Overal bed mobility: Needs Assistance Bed Mobility: Supine to Sit     Supine to sit: Min assist     General bed mobility comments: Min a to get right leg out of the bed. Pain with the transfer  Transfers Overall transfer level: Needs assistance Equipment used: Rolling walker (2 wheeled) Transfers: Sit to/from Stand Sit to Stand: Min assist         General transfer comment: Min a for balance to transfer from bed to chair. Cuing to slide right leg out when sitting.   Ambulation/Gait Ambulation/Gait assistance: Min guard Ambulation Distance (Feet): 10 Feet Assistive device: Rolling walker (2 wheeled) Gait Pattern/deviations: Step-to pattern;Decreased stance time - right;Decreased step length - left;Decreased step length -  right Gait velocity: decreased    General Gait Details: Slow step too gait pattenr. Patient had to put weight through his arms. Limited weight bearing on the lright  Stairs            Wheelchair Mobility    Modified Rankin (Stroke Patients Only)       Balance Overall balance assessment: Needs assistance   Sitting balance-Leahy Scale: Good     Standing balance support: Bilateral upper extremity supported Standing balance-Leahy Scale: Poor                               Pertinent Vitals/Pain Pain Assessment: 0-10 Pain Score: 8  Pain Location: right knee  Pain Descriptors / Indicators: Aching;Burning;Throbbing Pain Intervention(s): Limited activity within patient's tolerance;Monitored during session;Premedicated before session;Repositioned;Utilized relaxation techniques    Home Living Family/patient expects to be discharged to:: Skilled nursing facility Living Arrangements: Alone               Additional Comments: Patient has 1 step into his house. IIn January his sister was able to help him but he has no help at home at this time.     Prior Function Level of Independence: Independent         Comments: Still had some pain in his left knee      Hand Dominance   Dominant Hand: Left    Extremity/Trunk Assessment   Upper Extremity Assessment: Defer to OT evaluation           Lower Extremity Assessment: RLE deficits/detail RLE Deficits /  Details: Limited quad contracton on the right. Patient unable to perfrom a straight leg rasie at this time.     Cervical / Trunk Assessment: Normal  Communication      Cognition Arousal/Alertness: Awake/alert Behavior During Therapy: Restless;Agitated Overall Cognitive Status: Within Functional Limits for tasks assessed                      General Comments      Exercises Total Joint Exercises Ankle Circles/Pumps: 15 reps Quad Sets: 10 reps Heel Slides: 5 reps (Patient given sheet to  perform)   Assessment/Plan    PT Assessment Patient needs continued PT services  PT Problem List Decreased strength;Decreased range of motion;Decreased activity tolerance;Decreased balance;Decreased mobility;Decreased coordination;Pain          PT Treatment Interventions DME instruction;Gait training;Stair training;Functional mobility training;Therapeutic activities;Therapeutic exercise;Neuromuscular re-education;Patient/family education;Manual techniques;Modalities    PT Goals (Current goals can be found in the Care Plan section)  Acute Rehab PT Goals Patient Stated Goal: to go home  PT Goal Formulation: With patient/family Time For Goal Achievement: 04/05/16 Potential to Achieve Goals: Good    Frequency BID   Barriers to discharge Decreased caregiver support Patient has no-one at home to help him     Co-evaluation               End of Session Equipment Utilized During Treatment: Gait belt;Right knee immobilizer Activity Tolerance: Patient limited by pain Patient left: in chair;with call bell/phone within reach Nurse Communication: Mobility status;Weight bearing status         Time: 1010-1043 PT Time Calculation (min) (ACUTE ONLY): 33 min   Charges:   PT Evaluation $PT Eval Low Complexity: 1 Procedure     PT G Codes:        Carney Living PT DPT  03/22/2016, 11:58 AM

## 2016-03-22 NOTE — Progress Notes (Signed)
Physical Therapy Treatment Patient Details Name: Isaac [name] MRN: [id number] DOB: Sep 10, 1955 Today's Date: 03/22/2016    History of Present Illness Patient is S/P right TKA on 03/21/2016. At this time he is having increased pain. Patients knee was propped on a pillow for extension by the PA which was causing pain. Therapy explained the importance of regaining extension. He has a history of left TKA in January of 2017. Pertinent PMH:     PT Comments    Patient is still limited by pain but he was able to put more weight on his lower extremity in the afternoon. His range was measured at 14-82 with pain at end ranges. He would benefit from rehab at a SNF  Upon discharge.   Follow Up Recommendations  SNF     Equipment Recommendations  None recommended by PT    Recommendations for Other Services       Precautions / Restrictions Precautions Precautions: Knee;Fall Precaution Booklet Issued: Yes (comment) Precaution Comments: Patient given knee packet  Required Braces or Orthoses: Knee Immobilizer - Right Restrictions Weight Bearing Restrictions: Yes RLE Weight Bearing: Weight bearing as tolerated    Mobility  Bed Mobility Overal bed mobility: Needs Assistance Bed Mobility: Supine to Sit     Supine to sit: Min assist     General bed mobility comments: Patient left at the edge of the bed for OT to assess   Transfers Overall transfer level: Needs assistance Equipment used: Rolling walker (2 wheeled) Transfers: Sit to/from Stand Sit to Stand: Min assist         General transfer comment: Min a for balance to transfer from bed to chair. Cuing to slide right leg out when sitting.   Ambulation/Gait Ambulation/Gait assistance: Min guard Ambulation Distance (Feet): 14 Feet Assistive device: Rolling walker (2 wheeled) Gait Pattern/deviations: Step-to pattern;Decreased stance time - right Gait velocity: decreased    General Gait Details: Slow step too gait pattenr.  Patient had to put weight through his arms. Limited weight bearing on the lright. Patient became light headed at the doorway.    Stairs            Wheelchair Mobility    Modified Rankin (Stroke Patients Only)       Balance Overall balance assessment: Needs assistance   Sitting balance-Leahy Scale: Good     Standing balance support: Bilateral upper extremity supported Standing balance-Leahy Scale: Poor                      Cognition Arousal/Alertness: Awake/alert Behavior During Therapy: Restless;Agitated Overall Cognitive Status: Within Functional Limits for tasks assessed                      Exercises Total Joint Exercises Ankle Circles/Pumps: 15 reps Quad Sets: 10 reps Heel Slides: 5 reps (Patient given sheet to perform) Goniometric ROM: 14-82     General Comments        Pertinent Vitals/Pain Pain Assessment: 0-10 Pain Score: 5  Pain Location: right knee  Pain Descriptors / Indicators: Aching;Burning Pain Intervention(s): Limited activity within patient's tolerance;Monitored during session;Premedicated before session;Repositioned;Relaxation;Utilized relaxation techniques    Home Living Family/patient expects to be discharged to:: Skilled nursing facility Living Arrangements: Alone             Additional Comments: Patient has 1 step into his house. IIn January his sister was able to help him but he has no help at home at this time.  Prior Function Level of Independence: Independent      Comments: Still had some pain in his left knee    PT Goals (current goals can now be found in the care plan section) Acute Rehab PT Goals Patient Stated Goal: to go home  PT Goal Formulation: With patient/family Time For Goal Achievement: 04/05/16 Potential to Achieve Goals: Good    Frequency    7X/week      PT Plan      Co-evaluation             End of Session Equipment Utilized During Treatment: Gait belt;Right knee  immobilizer Activity Tolerance: Patient limited by pain Patient left: in chair;with call bell/phone within reach     Time: 1325-1340 PT Time Calculation (min) (ACUTE ONLY): 15 min  Charges:  $Gait Training: 8-22 mins                    G Codes:      Carney Living PT DPT  03/22/2016, 2:26 PM

## 2016-03-22 NOTE — Discharge Instructions (Signed)
INSTRUCTIONS AFTER JOINT REPLACEMENT  ° °o Remove items at home which could result in a fall. This includes throw rugs or furniture in walking pathways °o ICE to the affected joint every three hours while awake for 30 minutes at a time, for at least the first 3-5 days, and then as needed for pain and swelling.  Continue to use ice for pain and swelling. You may notice swelling that will progress down to the foot and ankle.  This is normal after surgery.  Elevate your leg when you are not up walking on it.   °o Continue to use the breathing machine you got in the hospital (incentive spirometer) which will help keep your temperature down.  It is common for your temperature to cycle up and down following surgery, especially at night when you are not up moving around and exerting yourself.  The breathing machine keeps your lungs expanded and your temperature down. ° ° °DIET:  As you were doing prior to hospitalization, we recommend a well-balanced diet. ° °DRESSING / WOUND CARE / SHOWERING ° °You may change your dressing 3-5 days after surgery.  Then change the dressing every day with sterile gauze.  Please use good hand washing techniques before changing the dressing.  Do not use any lotions or creams on the incision until instructed by your surgeon. ° °ACTIVITY ° °o Increase activity slowly as tolerated, but follow the weight bearing instructions below.   °o No driving for 6 weeks or until further direction given by your physician.  You cannot drive while taking narcotics.  °o No lifting or carrying greater than 10 lbs. until further directed by your surgeon. °o Avoid periods of inactivity such as sitting longer than an hour when not asleep. This helps prevent blood clots.  °o You may return to work once you are authorized by your doctor.  ° ° ° °WEIGHT BEARING  ° °Weight bearing as tolerated with assist device (walker, cane, etc) as directed, use it as long as suggested by your surgeon or therapist, typically at  least 4-6 weeks. ° ° °EXERCISES ° °Results after joint replacement surgery are often greatly improved when you follow the exercise, range of motion and muscle strengthening exercises prescribed by your doctor. Safety measures are also important to protect the joint from further injury. Any time any of these exercises cause you to have increased pain or swelling, decrease what you are doing until you are comfortable again and then slowly increase them. If you have problems or questions, call your caregiver or physical therapist for advice.  ° °Rehabilitation is important following a joint replacement. After just a few days of immobilization, the muscles of the leg can become weakened and shrink (atrophy).  These exercises are designed to build up the tone and strength of the thigh and leg muscles and to improve motion. Often times heat used for twenty to thirty minutes before working out will loosen up your tissues and help with improving the range of motion but do not use heat for the first two weeks following surgery (sometimes heat can increase post-operative swelling).  ° °These exercises can be done on a training (exercise) mat, on the floor, on a table or on a bed. Use whatever works the best and is most comfortable for you.    Use music or television while you are exercising so that the exercises are a pleasant break in your day. This will make your life better with the exercises acting as a break   in your routine that you can look forward to.   Perform all exercises about fifteen times, three times per day or as directed.  You should exercise both the operative leg and the other leg as well. ° °Exercises include: °  °• Quad Sets - Tighten up the muscle on the front of the thigh (Quad) and hold for 5-10 seconds.   °• Straight Leg Raises - With your knee straight (if you were given a brace, keep it on), lift the leg to 60 degrees, hold for 3 seconds, and slowly lower the leg.  Perform this exercise against  resistance later as your leg gets stronger.  °• Leg Slides: Lying on your back, slowly slide your foot toward your buttocks, bending your knee up off the floor (only go as far as is comfortable). Then slowly slide your foot back down until your leg is flat on the floor again.  °• Angel Wings: Lying on your back spread your legs to the side as far apart as you can without causing discomfort.  °• Hamstring Strength:  Lying on your back, push your heel against the floor with your leg straight by tightening up the muscles of your buttocks.  Repeat, but this time bend your knee to a comfortable angle, and push your heel against the floor.  You may put a pillow under the heel to make it more comfortable if necessary.  ° °A rehabilitation program following joint replacement surgery can speed recovery and prevent re-injury in the future due to weakened muscles. Contact your doctor or a physical therapist for more information on knee rehabilitation.  ° ° °CONSTIPATION ° °Constipation is defined medically as fewer than three stools per week and severe constipation as less than one stool per week.  Even if you have a regular bowel pattern at home, your normal regimen is likely to be disrupted due to multiple reasons following surgery.  Combination of anesthesia, postoperative narcotics, change in appetite and fluid intake all can affect your bowels.  ° °YOU MUST use at least one of the following options; they are listed in order of increasing strength to get the job done.  They are all available over the counter, and you may need to use some, POSSIBLY even all of these options:   ° °Drink plenty of fluids (prune juice may be helpful) and high fiber foods °Colace 100 mg by mouth twice a day  °Senokot for constipation as directed and as needed Dulcolax (bisacodyl), take with full glass of water  °Miralax (polyethylene glycol) once or twice a day as needed. ° °If you have tried all these things and are unable to have a bowel  movement in the first 3-4 days after surgery call either your surgeon or your primary doctor.   ° °If you experience loose stools or diarrhea, hold the medications until you stool forms back up.  If your symptoms do not get better within 1 week or if they get worse, check with your doctor.  If you experience "the worst abdominal pain ever" or develop nausea or vomiting, please contact the office immediately for further recommendations for treatment. ° ° °ITCHING:  If you experience itching with your medications, try taking only a single pain pill, or even half a pain pill at a time.  You can also use Benadryl over the counter for itching or also to help with sleep.  ° °TED HOSE STOCKINGS:  Use stockings on both legs until for at least 2 weeks or as   directed by physician office. They may be removed at night for sleeping.  MEDICATIONS:  See your medication summary on the After Visit Summary that nursing will review with you.  You may have some home medications which will be placed on hold until you complete the course of blood thinner medication.  It is important for you to complete the blood thinner medication as prescribed.  PRECAUTIONS:  If you experience chest pain or shortness of breath - call 911 immediately for transfer to the hospital emergency department.   If you develop a fever greater that 101 F, purulent drainage from wound, increased redness or drainage from wound, foul odor from the wound/dressing, or calf pain - CONTACT YOUR SURGEON.                                                   FOLLOW-UP APPOINTMENTS:  If you do not already have a post-op appointment, please call the office for an appointment to be seen by your surgeon.  Guidelines for how soon to be seen are listed in your After Visit Summary, but are typically between 1-4 weeks after surgery.  OTHER INSTRUCTIONS:   Knee Replacement:  Do not place pillow under knee, focus on keeping the knee straight while resting. CPM  instructions: 0-90 degrees, 2 hours in the morning, 2 hours in the afternoon, and 2 hours in the evening. Place foam block, curve side up under heel at all times except when in CPM or when walking.  DO NOT modify, tear, cut, or change the foam block in any way.  MAKE SURE YOU:   Understand these instructions.   Get help right away if you are not doing well or get worse.    Thank you for letting us be a part of your medical care team.  It is a privilege we respect greatly.  We hope these instructions will help you stay on track for a fast and full recovery!  Information on my medicine - ELIQUIS (apixaban)  This medication education was reviewed with me or my healthcare representative as part of my discharge preparation.  The pharmacist that spoke with me during my hospital stay was:  Tomasita Morrow, [facility]  Why was Eliquis prescribed for you? Eliquis was prescribed for you to reduce the risk of blood clots forming after orthopedic surgery.    What do You need to know about Eliquis? Take your Eliquis TWICE DAILY - one tablet in the morning and one tablet in the evening with or without food.  It would be best to take the dose about the same time each day.  If you have difficulty swallowing the tablet whole please discuss with your pharmacist how to take the medication safely.  Take Eliquis exactly as prescribed by your doctor and DO NOT stop taking Eliquis without talking to the doctor who prescribed the medication.  Stopping without other medication to take the place of Eliquis may increase your risk of developing a clot.  After discharge, you should have regular check-up appointments with your healthcare provider that is prescribing your Eliquis.  What do you do if you miss a dose? If a dose of ELIQUIS is not taken at the scheduled time, take it as soon as possible on the same day and twice-daily administration should be resumed.  The dose should not be doubled to  make up for a  missed dose.  Do not take more than one tablet of ELIQUIS at the same time.  Important Safety Information A possible side effect of Eliquis is bleeding. You should call your healthcare provider right away if you experience any of the following: ? Bleeding from an injury or your nose that does not stop. ? Unusual colored urine (red or dark brown) or unusual colored stools (red or black). ? Unusual bruising for unknown reasons. ? A serious fall or if you hit your head (even if there is no bleeding).  Some medicines may interact with Eliquis and might increase your risk of bleeding or clotting while on Eliquis. To help avoid this, consult your healthcare provider or pharmacist prior to using any new prescription or non-prescription medications, including herbals, vitamins, non-steroidal anti-inflammatory drugs (NSAIDs) and supplements.  This website has more information on Eliquis (apixaban): http://www.eliquis.com/eliquis/home

## 2016-03-22 NOTE — Progress Notes (Signed)
Orthopedic Tech Progress Note Patient Details:  Isaac [name] 12-24-1955 [id number] Ortho visit put on cpm at [place] Patient ID: Isaac [name], male   DOB: [birth year], 60 y.o.   MRN: [id number]   Isaac [name] 03/22/2016, 6:19 PM

## 2016-03-22 NOTE — Care Management Note (Signed)
Case Management Note  Patient Details  Name: Isaac [name] MRN: [id number] Date of Birth: 07-07-1955  Subjective/Objective:  60 yo M s/p R TKR              Action/Plan:  Received referral to assist with [facility] and DME   Expected Discharge Date: 03/24/16                 Expected Discharge Plan:  Skilled Nursing Facility  In-House Referral:  Clinical Social Work  Discharge planning Services  CM Consult  Post Acute Care Choice:    Choice offered to:     DME Arranged:    DME Agency:     HH Arranged:    [place] Agency:     Status of Service:  In process, will continue to follow  If discussed at Long Length of Stay Meetings, dates discussed:    Additional Comments: met with pt at bedside. He reports that he lives alone. The D/C plan is SNF. He stated that he wants to go to [facility]. [employer], SW for SNF referral.  Norina Buzzard, RN 03/22/2016, 11:11 AM

## 2016-03-22 NOTE — Care Management Note (Deleted)
Case Management Note  Patient Details  Name: CISCO KINDT MRN: [id number] Date of Birth: 05-19-1955  Subjective/Objective:                    Action/Plan:   Expected Discharge Date:                  Expected Discharge Plan:  [facility]  In-House Referral:  Clinical Social Work  Discharge planning Services  CM Consult  Post Acute Care Choice:    Choice offered to:     DME Arranged:    DME Agency:     HH Arranged:    [place] Agency:     Status of Service:  In process, will continue to follow  If discussed at Long Length of Stay Meetings, dates discussed:    Additional Comments:  Norina Buzzard, RN 03/22/2016, 11:15 AM

## 2016-03-22 NOTE — Evaluation (Signed)
Occupational Therapy Evaluation Patient Details Name: CAROLYN MANISCALCO MRN: [id number] DOB: 1955-07-25 Today's Date: 03/22/2016    History of Present Illness Patient is S/P right TKA on 03/21/2016. At this time he is having increased pain. Patients knee was propped on a pillow for extension by the PA which was causing pain. Therapy explained the importance of regaining extension. He has a history of left TKA in January of 2017. Pertinent PMH:    Clinical Impression   Pt admitted with the above diagnoses and presents with below problem list. Pt will benefit from continued acute OT to address the below listed deficits and maximize independence with basic ADLs prior to d/c to venue below. PTA pt was independent with ADLs. Pt is currently min guard to min A with LB ADLs and functional transfers/mobility. Pt with nausea with emesis during session. 8/10 pain R knee. Lightheaded/dizzy upon standing. VSS, assessed at end of session.       Follow Up Recommendations  SNF    Equipment Recommendations  Other (comment) (defer to next venue)    Recommendations for Other Services       Precautions / Restrictions Precautions Precautions: Knee;Fall Precaution Booklet Issued: Yes (comment) Precaution Comments: reviewed Required Braces or Orthoses: Knee Immobilizer - Right Restrictions Weight Bearing Restrictions: Yes RLE Weight Bearing: Weight bearing as tolerated      Mobility Bed Mobility Overal bed mobility: Needs Assistance Bed Mobility: Sit to Supine     Supine to sit: Min assist Sit to supine: Min assist;HOB elevated   General bed mobility comments: bed rail utilized, assist to advance RLE onto bed.  Transfers Overall transfer level: Needs assistance Equipment used: Rolling walker (2 wheeled) Transfers: Sit to/from Stand Sit to Stand: Min assist         General transfer comment: min A to steady. from EOB    Balance Overall balance assessment: Needs  assistance Sitting-balance support: No upper extremity supported;Feet supported Sitting balance-Leahy Scale: Good     Standing balance support: Bilateral upper extremity supported Standing balance-Leahy Scale: Poor                              ADL Overall ADL's : Needs assistance/impaired Eating/Feeding: Set up;Sitting   Grooming: Set up;Sitting   Upper Body Bathing: Set up;Sitting   Lower Body Bathing: Minimal assistance;Sit to/from stand   Upper Body Dressing : Set up;Sitting   Lower Body Dressing: Minimal assistance;Sit to/from stand   Toilet Transfer: Min guard;Minimal assistance;Ambulation   Toileting- Clothing Manipulation and Hygiene: Min guard;Minimal assistance;Sit to/from stand       Functional mobility during ADLs: Minimal assistance;Rolling walker General ADL Comments: Pt completed sidesteps to the right, sit<>stand transfer EOB, and bed mobility. Nausea with emesis and 8/10 pain limiting session. Also c/o lightheadedness/dizziness with standing. VSS, assessed at end of session     Vision     Perception     Praxis      Pertinent Vitals/Pain Pain Assessment: 0-10 Pain Score: 8  Pain Location: R knee Pain Descriptors / Indicators: Aching;Sore;Burning Pain Intervention(s): Limited activity within patient's tolerance;Monitored during session;Repositioned;Patient requesting pain meds-RN notified;Ice applied     Hand Dominance Left   Extremity/Trunk Assessment Upper Extremity Assessment Upper Extremity Assessment: Overall WFL for tasks assessed   Lower Extremity Assessment Lower Extremity Assessment: Defer to PT evaluation RLE Deficits / Details: Limited quad contracton on the right. Patient unable to perfrom a straight leg rasie at this  time.  RLE: Unable to fully assess due to pain   Cervical / Trunk Assessment Cervical / Trunk Assessment: Normal   Communication Communication Communication: No difficulties   Cognition  Arousal/Alertness: Awake/alert Behavior During Therapy: WFL for tasks assessed/performed;Anxious Overall Cognitive Status: Within Functional Limits for tasks assessed                     General Comments       Exercises Exercises: Total Joint     Shoulder Instructions      Home Living Family/patient expects to be discharged to:: Skilled nursing facility Living Arrangements: Alone                               Additional Comments: Patient has 1 step into his house. IIn January his sister was able to help him but he has no help at home at this time.       Prior Functioning/Environment Level of Independence: Independent        Comments: Still had some pain in his left knee         OT Problem List: Impaired balance (sitting and/or standing);Decreased knowledge of use of DME or AE;Decreased knowledge of precautions;Pain   OT Treatment/Interventions: Self-care/ADL training;DME and/or AE instruction;Therapeutic activities;Patient/family education;Balance training    OT Goals(Current goals can be found in the care plan section) Acute Rehab OT Goals Patient Stated Goal: rehab then home  OT Goal Formulation: With patient Time For Goal Achievement: 03/29/16 Potential to Achieve Goals: Good ADL Goals Pt Will Perform Lower Body Bathing: with modified independence;sit to/from stand Pt Will Perform Lower Body Dressing: with modified independence;sit to/from stand Pt Will Transfer to Toilet: with modified independence;ambulating Pt Will Perform Toileting - Clothing Manipulation and hygiene: with modified independence;sitting/lateral leans;sit to/from stand Pt Will Perform Tub/Shower Transfer: with supervision;ambulating;3 in 1;rolling walker Additional ADL Goal #1: Pt will complete bed mobility at mod I level to prepare for OOB ADLs.   OT Frequency: Min 2X/week   Barriers to D/C:            Co-evaluation              End of Session Equipment Utilized  During Treatment: Gait belt;Rolling walker;Right knee immobilizer CPM Right Knee CPM Right Knee: Off Nurse Communication: Patient requests pain meds;Other (comment) (nausea with emesis, lightheaded/dizzy in standing)  Activity Tolerance: Patient limited by pain;Other (comment) (nausea with emesis) Patient left: in bed;with call bell/phone within reach;with SCD's reapplied   Time: 1353-1420 OT Time Calculation (min): 27 min Charges:  OT General Charges $OT Visit: 1 Procedure OT Evaluation $OT Eval Low Complexity: 1 Procedure G-Codes:    Hortencia Pilar 10-Apr-2016, 2:43 PM

## 2016-03-23 ENCOUNTER — Encounter (HOSPITAL_COMMUNITY): Payer: Self-pay | Admitting: Orthopedic Surgery

## 2016-03-23 LAB — CBC
HCT: 39.4 % (ref 39.0–52.0)
HEMOGLOBIN: 12.9 g/dL — AB (ref 13.0–17.0)
MCH: 29.7 pg (ref 26.0–34.0)
MCHC: 32.7 g/dL (ref 30.0–36.0)
MCV: 90.6 fL (ref 78.0–100.0)
PLATELETS: 136 10*3/uL — AB (ref 150–400)
RBC: 4.35 MIL/uL (ref 4.22–5.81)
RDW: 14 % (ref 11.5–15.5)
WBC: 7.6 10*3/uL (ref 4.0–10.5)

## 2016-03-23 LAB — GLUCOSE, CAPILLARY
GLUCOSE-CAPILLARY: 132 mg/dL — AB (ref 65–99)
GLUCOSE-CAPILLARY: 175 mg/dL — AB (ref 65–99)
GLUCOSE-CAPILLARY: 178 mg/dL — AB (ref 65–99)
Glucose-Capillary: 156 mg/dL — ABNORMAL HIGH (ref 65–99)

## 2016-03-23 MED ORDER — SODIUM CHLORIDE 0.9% FLUSH
3.0000 mL | Freq: Two times a day (BID) | INTRAVENOUS | Status: DC
  Administered 2016-03-23 (×2): 3 mL via INTRAVENOUS

## 2016-03-23 MED ORDER — SODIUM CHLORIDE 0.9% FLUSH
3.0000 mL | INTRAVENOUS | Status: DC | PRN
  Administered 2016-03-23: 3 mL via INTRAVENOUS
  Filled 2016-03-23: qty 3

## 2016-03-23 NOTE — Clinical Social Work Placement (Signed)
   CLINICAL SOCIAL WORK PLACEMENT  NOTE  Date:  03/23/2016  Patient Details  Name: Isaac [name] MRN: [id number] Date of Birth: [birth year]  Clinical Social Work is seeking post-discharge placement for this patient at the [place] level of care (*CSW will initial, date and re-position this form in  chart as items are completed):  Yes   Patient/family provided with [facility] Work Department's list of facilities offering this level of care within the geographic area requested by the patient (or if unable, by the patient's family).  Yes   Patient/family informed of their freedom to choose among providers that offer the needed level of care, that participate in Medicare, Medicaid or managed care program needed by the patient, have an available bed and are willing to accept the patient.  Yes   Patient/family informed of [employer]'s ownership interest in [facility] and [facility], as well as of the fact that they are under no obligation to receive care at these facilities.  PASRR submitted to EDS on 03/23/16     PASRR number received on 03/23/16     Existing PASRR number confirmed on       FL2 transmitted to all facilities in geographic area requested by pt/family on 03/23/16     FL2 transmitted to all facilities within larger geographic area on       Patient informed that his/her managed care company has contracts with or will negotiate with certain facilities, including the following:        Yes   Patient/family informed of bed offers received.  Patient chooses bed at [facility]     Physician recommends and patient chooses bed at      Patient [facility] be [facility] to [facility] on  .  Patient to be transferred to facility by Ambulance     Patient family notified on   of transfer.  Name of family member notified:        PHYSICIAN Please sign FL2     Additional Comment:    Barbette Or, [place]

## 2016-03-23 NOTE — Progress Notes (Signed)
Orthopedic Tech Progress Note Patient Details:  [name] 10-Nov-1955 [id number] Ortho visit put on cpm at [place] Patient ID: Isaac [name], male   DOB: 10-22-1955, 60 y.o.   MRN: [id number]   Braulio Bosch 03/23/2016, 6:10 PM

## 2016-03-23 NOTE — Progress Notes (Signed)
Orthopedic Tech Progress Note Patient Details:  [name] 03-28-1956 [id number]  Patient ID: Isaac [name], male   DOB: [birth year], 60 y.o.   MRN: [id number]   Hildred Priest 03/23/2016, 1:32 PM Placed pt's rle on cpm @ 0-75 degrees @1330 ; RN notified

## 2016-03-23 NOTE — Progress Notes (Signed)
Physical Therapy Treatment Patient Details Name: Isaac [name] MRN: [id number] DOB: 1955-11-21 Today's Date: 03/23/2016    History of Present Illness Patient is S/P right TKA on 03/21/2016. At this time he is having increased pain. Patients knee was propped on a pillow for extension by the PA which was causing pain. Therapy explained the importance of regaining extension. He has a history of left TKA in January of 2017. Pertinent PMH:     PT Comments    Continuing work on Knee ROM and functional mobility; Noting good progress with progressive amb; Tends to get greater knee flexion range in sitting; will plan to work on self-AAROM into flexion in sitting next session.  Follow Up Recommendations  SNF     Equipment Recommendations  None recommended by PT    Recommendations for Other Services       Precautions / Restrictions Precautions Precautions: Knee;Fall Precaution Booklet Issued: Yes (comment) Precaution Comments: Pt educated to not allow any pillow or bolster under knee for healing with optimal range of motion.  Required Braces or Orthoses: Knee Immobilizer - Right Knee Immobilizer - Right: On when out of bed or walking;Discontinue post op day 2 Restrictions RLE Weight Bearing: Weight bearing as tolerated    Mobility  Bed Mobility Overal bed mobility: Needs Assistance Bed Mobility: Sit to Supine       Sit to supine: Supervision   General bed mobility comments: Uses LLE to assist RLE onto bed; discussed how important it is to work on the right knee musculature to control  Rknee  Transfers Overall transfer level: Needs assistance Equipment used: Rolling walker (2 wheeled) Transfers: Sit to/from Stand Sit to Stand: Min guard (without physical contact)         General transfer comment: Cues for hand placement and safety and to allow with R knee to bend generously during sit<>stand transitions  Ambulation/Gait Ambulation/Gait assistance: Min guard Ambulation  Distance (Feet): 80 Feet Assistive device: Rolling walker (2 wheeled) Gait Pattern/deviations: Step-through pattern;Antalgic;Decreased weight shift to right     General Gait Details: Cues to stand on straight knee in R stance and to allow for hip and knee flexion during R swing   Stairs            Wheelchair Mobility    Modified Rankin (Stroke Patients Only)       Balance     Sitting balance-Leahy Scale: Good       Standing balance-Leahy Scale: Poor (approaching Fair)                      Cognition Arousal/Alertness: Awake/alert Behavior During Therapy: WFL for tasks assessed/performed Overall Cognitive Status: Within Functional Limits for tasks assessed                      Exercises Total Joint Exercises Quad Sets: AROM;Right;20 reps Short Arc QuadSinclair Ship;Right;10 reps Heel Slides: AAROM;Right;10 reps Straight Leg Raises: AAROM;Right;10 reps Goniometric ROM: 10-80 approximately    General Comments        Pertinent Vitals/Pain Pain Assessment: 0-10 Pain Score: 8  Pain Location: R knee Pain Descriptors / Indicators: Aching;Grimacing;Guarding Pain Intervention(s): Monitored during session    Home Living                      Prior Function            PT Goals (current goals can now be found in the care plan section) Acute  Rehab PT Goals Patient Stated Goal: rehab then home  PT Goal Formulation: With patient/family Time For Goal Achievement: 04/05/16 Potential to Achieve Goals: Good Progress towards PT goals: Progressing toward goals    Frequency    7X/week      PT Plan Current plan remains appropriate    Co-evaluation             End of Session Equipment Utilized During Treatment: Gait belt Activity Tolerance: Patient tolerated treatment well Patient left: in bed;with call bell/phone within reach (promises me he will get some time in CPM after lunch)     Time: 5284-1324 PT Time Calculation (min)  (ACUTE ONLY): 34 min  Charges:  $Gait Training: 8-22 mins $Therapeutic Exercise: 8-22 mins                    G Codes:      [name] 03/23/2016, 1:10 PM  [name], [place] Pager [phone] Office [phone]

## 2016-03-23 NOTE — NC FL2 (Signed)
[place] LEVEL OF CARE SCREENING TOOL     IDENTIFICATION  Patient Name: Isaac [name] Birthdate: Mar 01, 1956 Sex: male Admission Date (Current Location): 03/21/2016  [facility] [place] Number:  Herbalist and Address:  [facility], [place] [address], [place], [place] [address]      Provider Number: [id number]  Attending Physician Name and Address:  Earlie Server, MD  Relative Name and Phone Number:       Current Level of Care: Hospital Recommended Level of Care: [place] Prior Approval Number:    Date Approved/Denied:   PASRR Number: [id number]  Discharge Plan: SNF    Current Diagnoses: Patient Active Problem List   Diagnosis Date Noted  . Peripheral neuropathy ([place])   . OSA on CPAP   . GERD (gastroesophageal reflux disease)   . Diabetes ([place])   . Type 2 diabetes mellitus ([place])   . Diabetes mellitus type 2 in obese ([place])   . Bladder cancer ([place])   . Primary localized osteoarthritis of right knee 03/21/2016  . Gout 10/22/2015  . Primary localized osteoarthritis of left knee 05/18/2015  . Personal history of colonic polyps 09/07/2013  . DM (diabetes mellitus) type 2, uncontrolled, with ketoacidosis ([place]) 06/19/2011  . Anxiety and depression 06/19/2011  . History of skin cancer 06/19/2011  . BLADDER CANCER , UNSPEC. 05/10/2008  . Hyperlipidemia 05/10/2008  . CAD, NATIVE VESSEL 05/10/2008  . RESTLESS LEGS SYNDROME 11/17/2007  . Essential hypertension 11/16/2007  . OSA (obstructive sleep apnea) 11/16/2007  . History of non-ST elevation myocardial infarction (NSTEMI) 05/13/2003    Orientation RESPIRATION BLADDER Height & Weight     Self, Time, Situation, Place  Other (Comment) (CPAP at night - patient had prior to admission) Continent Weight: 203 lb (92.1 kg) Height:  5\' 9"  (175.3 cm)  BEHAVIORAL SYMPTOMS/MOOD NEUROLOGICAL BOWEL NUTRITION STATUS      Continent Diet (Carb Modified Thin Liquids)   AMBULATORY STATUS COMMUNICATION OF NEEDS Skin   Limited Assist Verbally Surgical wounds (Right Knee Incision)                       Personal Care Assistance Level of Assistance  Bathing, Feeding, Dressing Bathing Assistance: Limited assistance Feeding assistance: Independent Dressing Assistance: Limited assistance     Functional Limitations Info  Sight, Hearing, Speech Sight Info: Adequate Hearing Info: Adequate Speech Info: Adequate    SPECIAL CARE FACTORS FREQUENCY  PT (By licensed PT), OT (By licensed OT)     PT Frequency: 5/week OT Frequency: 5/week            Contractures Contractures Info: Not present    Additional Factors Info  Code Status, Allergies, Insulin Sliding Scale Code Status Info: Full Code Allergies Info: No Known Allergies   Insulin Sliding Scale Info: Novolog 3 times daily with meals and bedtime       Current Medications (03/23/2016):  This is the current hospital active medication list Current Facility-Administered Medications  Medication Dose Route Frequency Provider Last Rate Last Dose  . acetaminophen (TYLENOL) tablet 650 mg  650 mg Oral Q6H PRN Chriss Czar, PA-C   650 mg at 03/22/16 2338   Or  . acetaminophen (TYLENOL) suppository 650 mg  650 mg Rectal Q6H PRN Chriss Czar, PA-C      . amLODipine (NORVASC) tablet 10 mg  10 mg Oral Daily Joshua Chadwell, PA-C   10 mg at 03/23/16 0933  . apixaban (ELIQUIS) tablet 2.5  mg  2.5 mg Oral Q12H Joshua Chadwell, PA-C   2.5 mg at 03/23/16 1610  . bisacodyl (DULCOLAX) EC tablet 5 mg  5 mg Oral Daily PRN Chriss Czar, PA-C      . celecoxib (CELEBREX) capsule 200 mg  200 mg Oral Q12H Joshua Chadwell, PA-C   200 mg at 03/23/16 0933  . diphenhydrAMINE (BENADRYL) 12.5 MG/5ML elixir 12.5-25 mg  12.5-25 mg Oral Q4H PRN Chriss Czar, PA-C      . docusate sodium (COLACE) capsule 100 mg  100 mg Oral BID Chriss Czar, PA-C   100 mg at 03/23/16 0933  . gabapentin (NEURONTIN) capsule 300 mg   300 mg Oral TID Chriss Czar, PA-C   300 mg at 03/23/16 0933  . HYDROmorphone (DILAUDID) injection 1 mg  1 mg Intravenous Q2H PRN Chriss Czar, PA-C   1 mg at 03/23/16 0944  . HYDROmorphone (DILAUDID) tablet 4 mg  4 mg Oral Q4H PRN Chriss Czar, PA-C   4 mg at 03/23/16 9604  . insulin aspart (novoLOG) injection 0-20 Units  0-20 Units Subcutaneous TID WC Chriss Czar, PA-C   3 Units at 03/23/16 0824  . insulin aspart (novoLOG) injection 0-5 Units  0-5 Units Subcutaneous QHS Joshua Chadwell, PA-C      . insulin aspart (novoLOG) injection 6 Units  6 Units Subcutaneous TID WC Joshua Chadwell, PA-C   6 Units at 03/23/16 [phone]  . isosorbide mononitrate (IMDUR) 24 hr tablet 120 mg  120 mg Oral q morning - 10a Joshua Chadwell, PA-C   120 mg at 03/23/16 0933  . menthol-cetylpyridinium (CEPACOL) lozenge 3 mg  1 lozenge Oral PRN Chriss Czar, PA-C       Or  . phenol (CHLORASEPTIC) mouth spray 1 spray  1 spray Mouth/Throat PRN Joshua Chadwell, PA-C      . methocarbamol (ROBAXIN) tablet 750 mg  750 mg Oral Q6H PRN Chriss Czar, PA-C   750 mg at 03/23/16 8119  . metoCLOPramide (REGLAN) tablet 5-10 mg  5-10 mg Oral Q8H PRN Chriss Czar, PA-C       Or  . metoCLOPramide (REGLAN) injection 5-10 mg  5-10 mg Intravenous Q8H PRN Joshua Chadwell, PA-C   10 mg at 03/22/16 1408  . metoprolol succinate (TOPROL-XL) 24 hr tablet 50 mg  50 mg Oral Daily Joshua Chadwell, PA-C   50 mg at 03/23/16 0933  . nitroGLYCERIN (NITROSTAT) SL tablet 0.4 mg  0.4 mg Sublingual Q5 min PRN Joshua Chadwell, PA-C      . ondansetron (ZOFRAN) tablet 4 mg  4 mg Oral Q6H PRN Joshua Chadwell, PA-C       Or  . ondansetron (ZOFRAN) injection 4 mg  4 mg Intravenous Q6H PRN Joshua Chadwell, PA-C   4 mg at 03/23/16 0530  . pantoprazole (PROTONIX) EC tablet 40 mg  40 mg Oral q morning - 10a Joshua Chadwell, PA-C   40 mg at 03/23/16 0934  . polyethylene glycol (MIRALAX / GLYCOLAX) packet 17 g  17 g Oral Daily PRN Chriss Czar,  PA-C      . rOPINIRole (REQUIP) tablet 1 mg  1 mg Oral QHS PRN Chriss Czar, PA-C      . rosuvastatin (CRESTOR) tablet 10 mg  10 mg Oral QODAY Joshua Chadwell, PA-C   10 mg at 03/22/16 0957  . sodium chloride flush (NS) 0.9 % injection 3 mL  3 mL Intravenous Q12H Earlie Server, MD   3 mL at 03/23/16 1000  . sodium chloride flush (NS) 0.9 % injection  3 mL  3 mL Intravenous PRN Earlie Server, MD      . sodium phosphate (FLEET) 7-19 GM/118ML enema 1 enema  1 enema Rectal Once PRN Chriss Czar, PA-C         Discharge Medications: Please see discharge summary for a list of discharge medications.  Relevant Imaging Results:  Relevant Lab Results:   Additional Information SSN [id number]   Barbette Or, [place]

## 2016-03-23 NOTE — Progress Notes (Signed)
Subjective: 2 Days Post-Op Procedure(s) (LRB): TOTAL KNEE ARTHROPLASTY (Right) Patient reports pain as 7 on 0-10 scale.    Objective: Vital signs in last 24 hours: Temp:  [97.8 F (36.6 C)-100.8 F (38.2 C)] 97.8 F (36.6 C) (11/12 0623) Pulse Rate:  [64-80] 74 (11/12 0623) Resp:  [16] 16 (11/11 1405) BP: (94-136)/(61-77) 136/77 (11/12 0623) SpO2:  [94 %-98 %] 95 % (11/12 0623)  Intake/Output from previous day: 11/11 0701 - 11/12 0700 In: 1065 [P.O.:960; I.V.:105] Out: 2425 [Urine:2425] Intake/Output this shift: Total I/O In: 460 [P.O.:460] Out: 1200 [Urine:1200]   Recent Labs  03/22/16 0604 03/23/16 0347  HGB 13.3 12.9*    Recent Labs  03/22/16 0604 03/23/16 0347  WBC 6.1 7.6  RBC 4.56 4.35  HCT 41.7 39.4  PLT 128* 136*    Recent Labs  03/22/16 0604  NA 136  K 4.4  CL 101  CO2 27  BUN 15  CREATININE 1.20  GLUCOSE 144*  CALCIUM 8.8*   No results for input(s): LABPT, INR in the last 72 hours.  ABD soft Neurovascular intact Sensation intact distally Intact pulses distally Dorsiflexion/Plantar flexion intact Incision: dressing C/D/I  Assessment/Plan: 2 Days Post-Op Procedure(s) (LRB): TOTAL KNEE ARTHROPLASTY (Right)  Principal Problem:   Primary localized osteoarthritis of right knee Active Problems:   Essential hypertension   Peripheral neuropathy (HCC)   OSA on CPAP   History of non-ST elevation myocardial infarction (NSTEMI)   GERD (gastroesophageal reflux disease)   Diabetes ([place])   Type 2 diabetes mellitus ([place])   Diabetes mellitus type 2 in obese [facility])   Bladder cancer ([place])  Advance diet Up with therapy Plan for discharge tomorrow Discharge to SNF  [facility] 03/23/2016, 11:30 AM

## 2016-03-23 NOTE — Clinical Social Work Note (Signed)
Clinical Social Work Assessment  Patient Details  Name: Isaac [name] MRN: [id number] Date of Birth: [birth year]  Date of referral:  03/22/16               Reason for consult:  Facility Placement                Permission sought to share information with:  Family Supports Permission granted to share information::  Yes, Verbal Permission Granted  Name::     Vickii Chafe  Relationship::  Father  Contact Information:  [phone]  Housing/Transportation Living arrangements for the past 2 months:  [place] of Information:  Patient Patient Interpreter Needed:  None Criminal Activity/Legal Involvement Pertinent to Current Situation/Hospitalization:  No - Comment as needed Significant Relationships:  Parents, Friend Lives with:  Self Do you feel safe going back to the place where you live?  Yes Need for family participation in patient care:     Care giving concerns:  Patient with no family/friends at bedside and states that he will provide all necessary updates as needed.   Social Worker assessment / plan:  Holiday representative met with patient at bedside to offer support and discuss patient plans at discharge.  Patient states that he lives at home alone and is interested in SNF placement at [facility].  Patient has been there in the past and is hopeful for return.  CSW communicated with admissions coordinator from [facility] who states that patient will have a bed available on Monday - patient aware.  CSW to submit clinical information for insurance authorization.  CSW remains available for support and to facilitate patient discharge needs once medically stable.  Employment status:  Disabled (Comment on whether or not currently receiving Disability) Insurance information:  Managed Medicare PT Recommendations:  [place] / Referral to community resources:  [place]  Patient/Family's Response to care:  Patient  verbalized understanding and appreciation for CSW support and involvement.  Patient agreeable to SNF and aware of process due to previous stay at [facility].  Patient/Family's Understanding of and Emotional Response to Diagnosis, Current Treatment, and Prognosis:  Patient aware of limitations and barriers to direct return home alone.  Patient is hopeful for full recovery and return home following rehab stay.  Emotional Assessment Appearance:  Appears stated age Attitude/Demeanor/Rapport:   (Engaged and Appreciative) Affect (typically observed):  Accepting, Appropriate, Calm, Pleasant Orientation:  Oriented to Self, Oriented to Situation, Oriented to Place, Oriented to  Time Alcohol / Substance use:  Not Applicable Psych involvement (Current and /or in the community):  No (Comment)  Discharge Needs  Concerns to be addressed:  No discharge needs identified Readmission within the last 30 days:  No Current discharge risk:  None Barriers to Discharge:  Continued Medical Work up  [employer], [place]

## 2016-03-23 NOTE — Progress Notes (Signed)
Physical Therapy Treatment Patient Details Name: KATHY WARES MRN: [id number] DOB: November 27, 1955 Today's Date: 03/23/2016    History of Present Illness Patient is S/P right TKA on 03/21/2016. At this time he is having increased pain. Patients knee was propped on a pillow for extension by the PA which was causing pain. Therapy explained the importance of regaining extension. He has a history of left TKA in January of 2017. Pertinent PMH:     PT Comments    Noting progress with more efficient gait steps and increasing A/AA/ROM R knee; Overall progressing well; Anticipate continuing good progress at post-acute rehabilitation.   Follow Up Recommendations  SNF     Equipment Recommendations  None recommended by PT    Recommendations for Other Services       Precautions / Restrictions Precautions Precautions: Knee;Fall Precaution Booklet Issued: Yes (comment) Precaution Comments: Pt educated to not allow any pillow or bolster under knee for healing with optimal range of motion.  Required Braces or Orthoses: Knee Immobilizer - Right Knee Immobilizer - Right: On when out of bed or walking;Discontinue post op day 2 Restrictions RLE Weight Bearing: Weight bearing as tolerated    Mobility  Bed Mobility Overal bed mobility: Needs Assistance Bed Mobility: Supine to Sit     Supine to sit: Supervision     General bed mobility comments: Less need to assist R knee  Transfers Overall transfer level: Needs assistance Equipment used: Rolling walker (2 wheeled) Transfers: Sit to/from Stand Sit to Stand: Min guard (without physical contact)         General transfer comment: Cues for hand placement and safety and to allow with R knee to bend generously during sit<>stand transitions  Ambulation/Gait Ambulation/Gait assistance: Min guard Ambulation Distance (Feet): 80 Feet Assistive device: Rolling walker (2 wheeled) Gait Pattern/deviations: Step-through pattern Gait velocity:  decreased    General Gait Details: Cues to stand on straight knee in R stance and to allow for hip and knee flexion during R swing   Stairs            Wheelchair Mobility    Modified Rankin (Stroke Patients Only)       Balance     Sitting balance-Leahy Scale: Good       Standing balance-Leahy Scale: Fair                      Cognition Arousal/Alertness: Awake/alert Behavior During Therapy: WFL for tasks assessed/performed Overall Cognitive Status: Within Functional Limits for tasks assessed                      Exercises Total Joint Exercises Long Arc Quad: AROM;AAROM;Right;10 reps Knee Flexion: AROM;AAROM;Right;10 reps;Seated    General Comments        Pertinent Vitals/Pain Pain Assessment: 0-10 Pain Score: 7  Pain Location: R knee, especially with flexion therex Pain Descriptors / Indicators: Aching;Grimacing;Guarding Pain Intervention(s): Limited activity within patient's tolerance;Monitored during session;Premedicated before session    Home Living                      Prior Function            PT Goals (current goals can now be found in the care plan section) Acute Rehab PT Goals Patient Stated Goal: rehab then home  PT Goal Formulation: With patient/family Time For Goal Achievement: 04/05/16 Potential to Achieve Goals: Good Progress towards PT goals: Progressing toward goals    Frequency  7X/week      PT Plan Current plan remains appropriate    Co-evaluation             End of Session Equipment Utilized During Treatment: Gait belt Activity Tolerance: Patient tolerated treatment well Patient left: in chair;with call bell/phone within reach;with family/visitor present     Time: 6812-7517 PT Time Calculation (min) (ACUTE ONLY): 33 min  Charges:  $Gait Training: 8-22 mins $Therapeutic Exercise: 8-22 mins                    G Codes:      [name] 03/23/2016, 5:17 PM  [name], [place] Pager [phone] Office [phone]

## 2016-03-24 DIAGNOSIS — I10 Essential (primary) hypertension: Secondary | ICD-10-CM | POA: Diagnosis not present

## 2016-03-24 DIAGNOSIS — M1711 Unilateral primary osteoarthritis, right knee: Secondary | ICD-10-CM | POA: Diagnosis not present

## 2016-03-24 DIAGNOSIS — E0829 Diabetes mellitus due to underlying condition with other diabetic kidney complication: Secondary | ICD-10-CM | POA: Diagnosis not present

## 2016-03-24 DIAGNOSIS — R600 Localized edema: Secondary | ICD-10-CM | POA: Diagnosis not present

## 2016-03-24 DIAGNOSIS — E119 Type 2 diabetes mellitus without complications: Secondary | ICD-10-CM | POA: Diagnosis not present

## 2016-03-24 DIAGNOSIS — E7800 Pure hypercholesterolemia, unspecified: Secondary | ICD-10-CM | POA: Diagnosis not present

## 2016-03-24 DIAGNOSIS — Z23 Encounter for immunization: Secondary | ICD-10-CM | POA: Diagnosis present

## 2016-03-24 DIAGNOSIS — Z7901 Long term (current) use of anticoagulants: Secondary | ICD-10-CM | POA: Diagnosis not present

## 2016-03-24 DIAGNOSIS — I25118 Atherosclerotic heart disease of native coronary artery with other forms of angina pectoris: Secondary | ICD-10-CM | POA: Diagnosis not present

## 2016-03-24 DIAGNOSIS — D62 Acute posthemorrhagic anemia: Secondary | ICD-10-CM | POA: Diagnosis not present

## 2016-03-24 DIAGNOSIS — R279 Unspecified lack of coordination: Secondary | ICD-10-CM | POA: Diagnosis not present

## 2016-03-24 DIAGNOSIS — G2581 Restless legs syndrome: Secondary | ICD-10-CM | POA: Diagnosis not present

## 2016-03-24 DIAGNOSIS — D696 Thrombocytopenia, unspecified: Secondary | ICD-10-CM | POA: Diagnosis not present

## 2016-03-24 DIAGNOSIS — R2689 Other abnormalities of gait and mobility: Secondary | ICD-10-CM | POA: Diagnosis not present

## 2016-03-24 DIAGNOSIS — Z471 Aftercare following joint replacement surgery: Secondary | ICD-10-CM | POA: Diagnosis not present

## 2016-03-24 DIAGNOSIS — M6281 Muscle weakness (generalized): Secondary | ICD-10-CM | POA: Diagnosis not present

## 2016-03-24 DIAGNOSIS — E669 Obesity, unspecified: Secondary | ICD-10-CM | POA: Diagnosis not present

## 2016-03-24 DIAGNOSIS — M25569 Pain in unspecified knee: Secondary | ICD-10-CM | POA: Diagnosis not present

## 2016-03-24 DIAGNOSIS — M159 Polyosteoarthritis, unspecified: Secondary | ICD-10-CM | POA: Diagnosis not present

## 2016-03-24 DIAGNOSIS — M1990 Unspecified osteoarthritis, unspecified site: Secondary | ICD-10-CM | POA: Diagnosis not present

## 2016-03-24 DIAGNOSIS — K219 Gastro-esophageal reflux disease without esophagitis: Secondary | ICD-10-CM | POA: Diagnosis not present

## 2016-03-24 DIAGNOSIS — G6289 Other specified polyneuropathies: Secondary | ICD-10-CM | POA: Diagnosis not present

## 2016-03-24 DIAGNOSIS — R2681 Unsteadiness on feet: Secondary | ICD-10-CM | POA: Diagnosis not present

## 2016-03-24 DIAGNOSIS — K5901 Slow transit constipation: Secondary | ICD-10-CM | POA: Diagnosis not present

## 2016-03-24 DIAGNOSIS — Z96651 Presence of right artificial knee joint: Secondary | ICD-10-CM | POA: Diagnosis not present

## 2016-03-24 DIAGNOSIS — E1169 Type 2 diabetes mellitus with other specified complication: Secondary | ICD-10-CM | POA: Diagnosis not present

## 2016-03-24 DIAGNOSIS — M25561 Pain in right knee: Secondary | ICD-10-CM | POA: Diagnosis not present

## 2016-03-24 LAB — CBC
HEMATOCRIT: 36 % — AB (ref 39.0–52.0)
HEMOGLOBIN: 11.9 g/dL — AB (ref 13.0–17.0)
MCH: 29.9 pg (ref 26.0–34.0)
MCHC: 33.1 g/dL (ref 30.0–36.0)
MCV: 90.5 fL (ref 78.0–100.0)
Platelets: 129 10*3/uL — ABNORMAL LOW (ref 150–400)
RBC: 3.98 MIL/uL — ABNORMAL LOW (ref 4.22–5.81)
RDW: 14.1 % (ref 11.5–15.5)
WBC: 6.6 10*3/uL (ref 4.0–10.5)

## 2016-03-24 LAB — GLUCOSE, CAPILLARY
GLUCOSE-CAPILLARY: 136 mg/dL — AB (ref 65–99)
GLUCOSE-CAPILLARY: 127 mg/dL — AB (ref 65–99)
Glucose-Capillary: 155 mg/dL — ABNORMAL HIGH (ref 65–99)
Glucose-Capillary: 163 mg/dL — ABNORMAL HIGH (ref 65–99)

## 2016-03-24 MED ORDER — CELECOXIB 200 MG PO CAPS: 200.0000 mg | ORAL_CAPSULE | Freq: Two times a day (BID) | ORAL | 0 refills | Status: DC

## 2016-03-24 MED ORDER — GABAPENTIN 300 MG PO CAPS: 300.0000 mg | ORAL_CAPSULE | Freq: Three times a day (TID) | ORAL | 0 refills | Status: DC

## 2016-03-24 NOTE — Progress Notes (Signed)
Subjective: 3 Days Post-Op Procedure(s) (LRB): TOTAL KNEE ARTHROPLASTY (Right) Patient reports pain as moderate.    Objective: Vital signs in last 24 hours: Temp:  [98.7 F (37.1 C)-100.1 F (37.8 C)] 98.7 F (37.1 C) (11/13 0619) Pulse Rate:  [73-83] 77 (11/13 0619) Resp:  [16] 16 (11/12 1404) BP: (120-143)/(71-81) 132/81 (11/13 0619) SpO2:  [94 %-97 %] 97 % (11/13 0619)  Intake/Output from previous day: 11/12 0701 - 11/13 0700 In: 1840 [P.O.:1840] Out: 3200 [Urine:3200] Intake/Output this shift: No intake/output data recorded.   Recent Labs  03/22/16 0604 03/23/16 0347 03/24/16 0443  HGB 13.3 12.9* 11.9*    Recent Labs  03/23/16 0347 03/24/16 0443  WBC 7.6 6.6  RBC 4.35 3.98*  HCT 39.4 36.0*  PLT 136* 129*    Recent Labs  03/22/16 0604  NA 136  K 4.4  CL 101  CO2 27  BUN 15  CREATININE 1.20  GLUCOSE 144*  CALCIUM 8.8*   No results for input(s): LABPT, INR in the last 72 hours.  Neurovascular intact Sensation intact distally Intact pulses distally Dorsiflexion/Plantar flexion intact Incision: dressing C/D/I  Assessment/Plan: 3 Days Post-Op Procedure(s) (LRB): TOTAL KNEE ARTHROPLASTY (Right) Up with therapy Discharge to SNF  dsg change before he leaves today   Delita Chiquito 03/24/2016, 8:34 AM

## 2016-03-24 NOTE — Clinical Social Work Note (Signed)
Patient will discharge today per MD order. Patient will discharge to: [facility] SNF RN to call report prior to transportation to: [phone] Transportation: [facility] to be arranged after insurance authorization received.  Auth submitted.  CSW sent discharge summary to SNF for review.    Nonnie Done, MSW, LCSW  [phone]  Licensed Clinical Social Worker

## 2016-03-24 NOTE — Clinical Social Work Placement (Signed)
   CLINICAL SOCIAL WORK PLACEMENT  NOTE  Date:  03/24/2016  Patient Details  Name: Isaac [name] MRN: [id number] Date of Birth: [birth year]  Clinical Social Work is seeking post-discharge placement for this patient at the [place] level of care (*CSW will initial, date and re-position this form in  chart as items are completed):  Yes   Patient/family provided with [place] Work Department's list of facilities offering this level of care within the geographic area requested by the patient (or if unable, by the patient's family).  Yes   Patient/family informed of their freedom to choose among providers that offer the needed level of care, that participate in Medicare, Medicaid or managed care program needed by the patient, have an available bed and are willing to accept the patient.  Yes   Patient/family informed of [facility]'s ownership interest in [facility] and [facility], as well as of the fact that they are under no obligation to receive care at these facilities.  PASRR submitted to EDS on 03/23/16     PASRR number received on 03/23/16     Existing PASRR number confirmed on       FL2 transmitted to all facilities in geographic area requested by pt/family on 03/23/16     FL2 transmitted to all facilities within larger geographic area on       Patient informed that his/her managed care company has contracts with or will negotiate with certain facilities, including the following:        Yes   Patient/family informed of bed offers received.  Patient chooses bed at [facility]     Physician recommends and patient chooses bed at      Patient to be transferred to [facility] on  .  Patient to be transferred to facility by Ambulance     Patient family notified on 03/24/16 of transfer.  Name of family member notified:  patient is alert and oriented x 4 and will update family as needed per patient request     PHYSICIAN Please  sign FL2     Additional Comment:    _______________________________________________ Dulcy Fanny, LCSW 03/24/2016, 11:02 AM

## 2016-03-24 NOTE — Progress Notes (Signed)
Report given to [facility].

## 2016-03-24 NOTE — Care Management Important Message (Signed)
Important Message  Patient Details  Name: Isaac [name] MRN: [id number] Date of Birth: February 19, 1956   Medicare Important Message Given:  Yes    Orbie Pyo 03/24/2016, 11:23 AM

## 2016-03-24 NOTE — Discharge Summary (Signed)
PATIENT ID: Isaac [name]        MRN:  [id number]          DOB/AGE: [birth year] / 60 y.o.    DISCHARGE SUMMARY  ADMISSION DATE:    03/21/2016 DISCHARGE DATE:   03/24/2016   ADMISSION DIAGNOSIS: OA RIGHT KNEE    DISCHARGE DIAGNOSIS:  OA RIGHT KNEE    ADDITIONAL DIAGNOSIS: Principal Problem:   Primary localized osteoarthritis of right knee Active Problems:   Essential hypertension   Peripheral neuropathy (HCC)   OSA on CPAP   History of non-ST elevation myocardial infarction (NSTEMI)   GERD (gastroesophageal reflux disease)   Diabetes ([place])   Type 2 diabetes mellitus ([place])   Diabetes mellitus type 2 in obese ([place])   Bladder cancer ([place])  Past Medical History:  Diagnosis Date  . Arthritis   . Bladder cancer [facility]) followed by dr Risa Grill  . Bladder cancer ([place])   . Coronary artery disease    followed by Dr. [name]; Lexiscan Myoview (10/14):  Low risk, EF 57%, small inf infarct  . Coronary vasospasm ([place]) PER DR ROSS NOTE 03-22-2012--  INTER. TIGHTNESS   PER PT PROBABLE FROM STRESS  . Depression    no rx  . Diabetes mellitus    followed by Dr. Dwyane Dee  . Dyslipidemia    pt unable to tolerate niaspan  . GERD (gastroesophageal reflux disease)   . History of angina CHRONIC -- CONTROLLED W/ IMDUR  . 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 History of non-ST elevation myocardial infarction (NSTEMI) 2005  . Hypertension   . Myocardial infarction   . OSA on CPAP   . Peripheral neuropathy (HCC) RIGHT LEG NUMBNESS  . Pneumonia     PROCEDURE: Procedure(s): TOTAL KNEE ARTHROPLASTY Right on 03/21/2016  CONSULTS: PT/OT/SW    HISTORY:  See H&P in chart  HOSPITAL COURSE:  Isaac [name] is a 60 y.o. admitted on 03/21/2016 and found to have a diagnosis of OA RIGHT KNEE.  After appropriate laboratory studies were obtained  they were taken to the operating room on 03/21/2016 and underwent  Procedure(s): TOTAL KNEE ARTHROPLASTY   Right.   They were given perioperative antibiotics:  Anti-infectives    Start     Dose/Rate Route Frequency Ordered Stop   03/21/16 1700  ceFAZolin (ANCEF) IVPB 1 g/50 mL premix     1 g 100 mL/hr over 30 Minutes Intravenous Every 6 hours 03/21/16 1628 03/21/16 2325   03/21/16 0833  ceFAZolin (ANCEF) IVPB 2g/100 mL premix     2 g 200 mL/hr over 30 Minutes Intravenous On call to O.R. 03/21/16 9528 03/21/16 1041    .  Tolerated the procedure well.   POD #1, allowed out of bed to a chair.  PT for ambulation and exercise program.   IV saline locked.  O2 discontionued.  POD #2, continued PT and ambulation. .  The remainder of the hospital course was dedicated to ambulation and strengthening.   The patient was discharged on 3 Days Post-Op in  Stable condition.  Blood products given:none  DIAGNOSTIC STUDIES: Recent vital signs: Patient Vitals for the past 24 hrs:  BP Temp Temp src Pulse Resp SpO2  03/24/16 0619 132/81 98.7 F (37.1 C) Oral 77 - 97 %  03/23/16 2024 (!) 143/71 100.1 F (37.8 C) Oral 83 - 96 %  03/23/16 1404 120/71 99.1 F (37.3 C) Oral 73 16 94 %  Recent laboratory studies:  Recent Labs  03/22/16 0604 03/23/16 0347 03/24/16 0443  WBC 6.1 7.6 6.6  HGB 13.3 12.9* 11.9*  HCT 41.7 39.4 36.0*  PLT 128* 136* 129*    Recent Labs  03/22/16 0604  NA 136  K 4.4  CL 101  CO2 27  BUN 15  CREATININE 1.20  GLUCOSE 144*  CALCIUM 8.8*   Lab Results  Component Value Date   INR 0.94 03/11/2016   INR 1.13 05/08/2015   INR 1.1 (H) 01/25/2015     Recent Radiographic Studies :  No results found.  DISCHARGE INSTRUCTIONS:   DISCHARGE MEDICATIONS:     Medication List    STOP taking these medications   aspirin EC 81 MG tablet   ibuprofen 200 MG tablet Commonly known as:  ADVIL,MOTRIN     TAKE these medications   amLODipine 10 MG tablet Commonly known as:  NORVASC Take 1 tablet (10 mg total) by mouth daily. Hold for SBP < 110   apixaban 2.5 MG  Tabs tablet Commonly known as:  ELIQUIS Take 1 tablet (2.5 mg total) by mouth 2 (two) times daily.   canagliflozin 300 MG Tabs tablet Commonly known as:  INVOKANA Take 1 tablet (300 mg total) by mouth daily.   celecoxib 200 MG capsule Commonly known as:  CELEBREX Take 1 capsule (200 mg total) by mouth every 12 (twelve) hours. x2 weeks from discharge   gabapentin 300 MG capsule Commonly known as:  NEURONTIN Take 1 capsule (300 mg total) by mouth 3 (three) times daily. x3 weeks then bid x3 days, then 1 tab daily x4 days then discontinue   glimepiride 2 MG tablet Commonly known as:  AMARYL TAKE 1 BY MOUTH DAILY BEFORE SUPPER   HYDROmorphone 4 MG tablet Commonly known as:  DILAUDID Take 1 tablet (4 mg total) by mouth every 4 (four) hours as needed for moderate pain or severe pain. What changed:  medication strength  how much to take  when to take this  reasons to take this   isosorbide mononitrate 120 MG 24 hr tablet Commonly known as:  IMDUR Take 1 tablet (120 mg total) by mouth every morning.   metFORMIN 1000 MG tablet Commonly known as:  GLUCOPHAGE Take 1 tablet (1,000 mg total) by mouth 2 (two) times daily with a meal.   methocarbamol 750 MG tablet Commonly known as:  ROBAXIN Take 1 tablet (750 mg total) by mouth every 6 (six) hours as needed for muscle spasms. What changed:  See the new instructions.   metoprolol succinate 50 MG 24 hr tablet Commonly known as:  TOPROL-XL Take 1 tablet (50 mg total) by mouth daily. Take with or immediately following a meal. Hold for SBP <110 ID<60   nitroGLYCERIN 0.4 MG SL tablet Commonly known as:  NITROSTAT Place 1 tablet (0.4 mg total) under the tongue every 5 (five) minutes as needed for chest pain.   pantoprazole 40 MG tablet Commonly known as:  PROTONIX Take 1 tablet (40 mg total) by mouth every morning.   rOPINIRole 1 MG tablet Commonly known as:  REQUIP 1.5 tablets by mouth at bedtime for restless legs    rosuvastatin 10 MG tablet Commonly known as:  CRESTOR TAKE 1 TAB (10 MG) BY MOUTH EVERY OTHER DAY       FOLLOW UP VISIT:   Follow-up Information    CAFFREY JR,W D, MD. Schedule an appointment as soon as possible for a visit in 2 weeks.   Specialty:  Orthopedic Surgery Contact information: [place] [address] [phone]           DISPOSITION:   Skilled Nursing Facility/Rehab  CONDITION:  Stable   Chriss Czar, PA-C  03/24/2016 11:13 AM

## 2016-03-24 NOTE — Progress Notes (Signed)
Physical Therapy Treatment Patient Details Name: Isaac [name] MRN: [id number] DOB: 1955/11/14 Today's Date: 03/24/2016    History of Present Illness Patient is S/P right TKA on 03/21/2016. At this time he is having increased pain. Patients knee was propped on a pillow for extension by the PA which was causing pain. Therapy explained the importance of regaining extension. He has a history of left TKA in January of 2017. Pertinent PMH:     PT Comments    Continuing progress with functional mobility and knee ROM; Pt with good participation, despite considerable pain R knee; noting plan for dc to SNF today; PT in agreement   Follow Up Recommendations  SNF     Equipment Recommendations  None recommended by PT    Recommendations for Other Services       Precautions / Restrictions Precautions Precautions: Knee Precaution Booklet Issued: Yes (comment) Precaution Comments: Pt educated to not allow any pillow or bolster under knee for healing with optimal range of motion.  Required Braces or Orthoses: Knee Immobilizer - Right Knee Immobilizer - Right: On when out of bed or walking;Discontinue post op day 2 Restrictions Weight Bearing Restrictions: Yes RLE Weight Bearing: Weight bearing as tolerated    Mobility  Bed Mobility Overal bed mobility: Needs Assistance Bed Mobility: Supine to Sit     Supine to sit: Supervision     General bed mobility comments: Less need to assist R knee  Transfers Overall transfer level: Needs assistance Equipment used: Rolling walker (2 wheeled) Transfers: Sit to/from Stand Sit to Stand: Min guard (without physical contact)         General transfer comment: Cues for hand placement and safety and to allow with R knee to bend generously during sit<>stand transitions  Ambulation/Gait Ambulation/Gait assistance: Min guard;Supervision Ambulation Distance (Feet): 60 Feet Assistive device: Rolling walker (2 wheeled) Gait Pattern/deviations:  Step-through pattern     General Gait Details: Cues to stand on straight knee in R stance and to allow for hip and knee flexion during R swing; more paindul during first walk of the day; slow pace   Stairs            Wheelchair Mobility    Modified Rankin (Stroke Patients Only)       Balance     Sitting balance-Leahy Scale: Good       Standing balance-Leahy Scale: Fair                      Cognition Arousal/Alertness: Awake/alert Behavior During Therapy: WFL for tasks assessed/performed Overall Cognitive Status: Within Functional Limits for tasks assessed                      Exercises Total Joint Exercises Quad Sets: AROM;Right;20 reps Short Arc QuadSinclair Ship;Right;10 reps Heel Slides: AAROM;Right;10 reps Straight Leg Raises: AAROM;Right;10 reps Goniometric ROM: approx 8-91degrees    General Comments        Pertinent Vitals/Pain Pain Assessment: Faces Faces Pain Scale: Hurts whole lot Pain Location: R knee at end range flexion Pain Descriptors / Indicators: Aching;Grimacing;Guarding Pain Intervention(s): Limited activity within patient's tolerance;Monitored during session;Repositioned    Home Living                      Prior Function            PT Goals (current goals can now be found in the care plan section) Acute Rehab PT Goals Patient Stated Goal:  rehab then home  PT Goal Formulation: With patient/family Time For Goal Achievement: 04/05/16 Potential to Achieve Goals: Good Progress towards PT goals: Progressing toward goals    Frequency    7X/week      PT Plan Current plan remains appropriate    Co-evaluation             End of Session Equipment Utilized During Treatment: Gait belt Activity Tolerance: Patient tolerated treatment well Patient left: in chair;with call bell/phone within reach     Time: 0919-0952 PT Time Calculation (min) (ACUTE ONLY): 33 min  Charges:  $Gait Training: 8-22  mins $Therapeutic Exercise: 8-22 mins                    G Codes:      [name] 03/24/2016, 10:35 AM  [name], [place] Pager [phone] Office [phone]

## 2016-03-25 ENCOUNTER — Encounter: Payer: Self-pay | Admitting: Internal Medicine

## 2016-03-25 ENCOUNTER — Non-Acute Institutional Stay (SKILLED_NURSING_FACILITY): Payer: Medicare Other | Admitting: Internal Medicine

## 2016-03-25 DIAGNOSIS — G6289 Other specified polyneuropathies: Secondary | ICD-10-CM

## 2016-03-25 DIAGNOSIS — E1169 Type 2 diabetes mellitus with other specified complication: Secondary | ICD-10-CM

## 2016-03-25 DIAGNOSIS — K219 Gastro-esophageal reflux disease without esophagitis: Secondary | ICD-10-CM

## 2016-03-25 DIAGNOSIS — R2681 Unsteadiness on feet: Secondary | ICD-10-CM

## 2016-03-25 DIAGNOSIS — E119 Type 2 diabetes mellitus without complications: Secondary | ICD-10-CM

## 2016-03-25 DIAGNOSIS — I25118 Atherosclerotic heart disease of native coronary artery with other forms of angina pectoris: Secondary | ICD-10-CM | POA: Diagnosis not present

## 2016-03-25 DIAGNOSIS — E669 Obesity, unspecified: Secondary | ICD-10-CM

## 2016-03-25 DIAGNOSIS — I10 Essential (primary) hypertension: Secondary | ICD-10-CM

## 2016-03-25 DIAGNOSIS — M1711 Unilateral primary osteoarthritis, right knee: Secondary | ICD-10-CM | POA: Diagnosis not present

## 2016-03-25 DIAGNOSIS — G2581 Restless legs syndrome: Secondary | ICD-10-CM

## 2016-03-25 DIAGNOSIS — D62 Acute posthemorrhagic anemia: Secondary | ICD-10-CM

## 2016-03-25 DIAGNOSIS — D696 Thrombocytopenia, unspecified: Secondary | ICD-10-CM

## 2016-03-25 DIAGNOSIS — K5901 Slow transit constipation: Secondary | ICD-10-CM | POA: Diagnosis not present

## 2016-03-25 NOTE — Progress Notes (Signed)
LOCATION: [place]  PCP: Elby Showers, MD   Code Status:Full Code  Goals of care: Advanced Directive information Advanced Directives 03/11/2016  Does patient have an advance directive? Yes  Type of Advance Directive Living will  Does patient want to make changes to advanced directive? No - Patient declined  Copy of advanced directive(s) in chart? No - copy requested  Pre-existing out of facility DNR order (yellow form or pink MOST form) -  Some encounter information is confidential and restricted. Go to Review Flowsheets activity to see all data.       Extended Emergency Contact Information Primary Emergency Contact: Shippy,Edward Address: [address]          [place], [place] [address] Johnnette Litter of [place] Phone: [phone] Mobile Phone: [phone] Relation: Father Secondary Emergency Contact: Loni Muse;[place]ly  [place] [place]: [phone] Relation: None   No Known Allergies  Chief Complaint  Patient presents with  . New Admit To SNF    New Admission Visit     HPI:  Patient is a 60 y.o. male seen today for short term rehabilitation post hospital admission from 03/21/16-03/24/16 with right knee OA. He underwent right total knee arthroplasty. He is seen in his room today.  Review of Systems:  Constitutional: Negative for fever, chills, diaphoresis.  HENT: Negative for headache, congestion, nasal discharge, difficulty swallowing.   Eyes: Negative for double vision and discharge. Wears glasses. Respiratory: Negative for cough, shortness of breath and wheezing.   Cardiovascular: Negative for chest pain, palpitations, leg swelling.  Gastrointestinal: Negative for heartburn, nausea, vomiting, abdominal pain. Last bowel movement was Friday. At home had bowel movement every day. Genitourinary: Negative for dysuria and flank pain.  Musculoskeletal: Negative for back pain, fall in the facility. positive for knee pain and pain  medication has not been helpful. Skin: Negative for itching, rash.  Neurological: Positive for occasional dizziness with change of position. Psychiatric/Behavioral: Negative for depression   Past Medical History:  Diagnosis Date  . Arthritis   . Bladder cancer [facility]) followed by dr Risa Grill  . Bladder cancer ([place])   . Coronary artery disease    followed by Dr. [name]; Lexiscan Myoview (10/14):  Low risk, EF 57%, small inf infarct  . Coronary vasospasm ([place]) PER DR ROSS NOTE 03-22-2012--  INTER. TIGHTNESS   PER PT PROBABLE FROM STRESS  . Depression    no rx  . Diabetes mellitus    followed by Dr. Dwyane Dee  . Dyslipidemia    pt unable to tolerate niaspan  . GERD (gastroesophageal reflux disease)   . History of angina CHRONIC -- CONTROLLED W/ IMDUR  . 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 History of non-ST elevation myocardial infarction (NSTEMI) 2005  . Hypertension   . Myocardial infarction   . OSA on CPAP   . Peripheral neuropathy (HCC) RIGHT LEG NUMBNESS  . Pneumonia    Past Surgical History:  Procedure Laterality Date  . APPENDECTOMY  08-03-2004  . CARDIAC CATHETERIZATION  x3  last one 06-17-2007   MILD NON-OBSTRUCTIVE CAD/ NORMAL LVF/ 30% LEFT RENAL ARTERY STENOSIS  . CYSTO/ BILATERAL RETROGRADE PYELOGRAM/ RIGHT URETEROSCOPIC LASER FULGURATION URETERAL TUMOR  02-16-2008  . CYSTO/ BLADDER BX  09-13-2008;  08-13-2007; 04-19-2007; 06-01-2006  . CYSTO/ CYSTOGRAM/ URETEROSCOPY  02-21-2009  . CYSTO/ RESECTION BLADDER TUMOR/ LEFT RETROGRADE PYELOGRAM/ RIGHT URETEROSCOPY  08-07-2010   TCC OF BLADDER   S/P DISTAL URETERECTOMY/ REIMPLANTATION  . CYSTO/  RIGHT URETEROSCOPY/ BX URETERAL TUMOR  10-11-2008  . CYSTOSCOPY W/ RETROGRADES  04/19/2012   Procedure: CYSTOSCOPY WITH RETROGRADE PYELOGRAM;  Surgeon: Bernestine Amass, MD;  Location: [facility];  Service: Urology;  Laterality: Bilateral;  30 MINS Cysto, Biopsy, possible  TURBT, Bilateral retrograde pyelograms with Mitomycin C instilation Post op   . CYSTOSCOPY WITH BIOPSY  04/07/2011   Procedure: CYSTOSCOPY WITH BIOPSY/ RIGHT URETEROSCOPY;  Surgeon: Bernestine Amass, MD;  Location: [facility];  Service: Urology;  Laterality: N/A;    cystoscopy, cystogram, biopsy and fulgeration  . CYSTOSCOPY WITH BIOPSY  12/29/2011   Procedure: CYSTOSCOPY WITH BIOPSY;  Surgeon: Bernestine Amass, MD;  Location: [facility];  Service: Urology;  Laterality: N/A;  [place]    . ELBOW SURGERY  07-07-2003   RIGHT ELBOW ARTHROSCOPY W/ OPEN RECONSTRUCTION  . KNEE ARTHROSCOPY W/ DEBRIDEMENT  02-08-2004   RIGHT KNEE  . RHINOPLASTY W/ MODIFIED CARTILAGE GRAFT  05-17-2007   INTERNAL NASAL VALVE COLLAPSE/ OSA/ SEPTAL PERFERATION  . RIGHT DISTAL URETERECTOMY W/ REIMPLANTATION  10-30-2008   TCC OF RIGHT DISTAL URETER  . SHOULDER SURGERY  2005   RIGHT ROTATOR CUFF REPAIR  . TOTAL KNEE ARTHROPLASTY Left 05/18/2015  . TOTAL KNEE ARTHROPLASTY Left 05/18/2015   Procedure: TOTAL KNEE ARTHROPLASTY;  Surgeon: Earlie Server, MD;  Location: [place];  Service: Orthopedics;  Laterality: Left;  . TOTAL KNEE ARTHROPLASTY Right 03/21/2016   Procedure: TOTAL KNEE ARTHROPLASTY;  Surgeon: Earlie Server, MD;  Location: [facility];  Service: Orthopedics;  Laterality: Right;  . TRANSURETHRAL RESECTION OF BLADDER TUMOR  01-13-2007  . TRANSURETHRAL RESECTION OF BLADDER TUMOR  04/19/2012   Procedure: TRANSURETHRAL RESECTION OF BLADDER TUMOR (TURBT);  Surgeon: Bernestine Amass, MD;  Location: [facility];  Service: Urology;  Laterality: N/A;  . umbillical hernia repair  2004   Social History:   reports that he quit smoking about 8 years ago. His smoking use included Cigarettes. He has a 10.00 pack-year smoking history. He has never used smokeless tobacco. He reports that he drinks alcohol. He reports that he does not use drugs.  Family History  Problem  Relation Age of Onset  . Coronary artery disease Father   . Diabetes Maternal Grandmother   . Heart disease Paternal Grandfather   . Colon cancer Neg Hx   . Stomach cancer Neg Hx     Medications:   Medication List       Accurate as of 03/25/16 12:30 PM. Always use your most recent med list.          amLODipine 10 MG tablet Commonly known as:  NORVASC Take 1 tablet (10 mg total) by mouth daily. Hold for SBP < 110   apixaban 2.5 MG Tabs tablet Commonly known as:  ELIQUIS Take 1 tablet (2.5 mg total) by mouth 2 (two) times daily.   canagliflozin 300 MG Tabs tablet Commonly known as:  INVOKANA Take 1 tablet (300 mg total) by mouth daily.   gabapentin 300 MG capsule Commonly known as:  NEURONTIN Take 1 capsule (300 mg total) by mouth 3 (three) times daily. x3 weeks then bid x3 days, then 1 tab daily x4 days then discontinue   glimepiride 2 MG tablet Commonly known as:  AMARYL TAKE 1 BY MOUTH DAILY BEFORE SUPPER   HYDROmorphone 4 MG tablet Commonly known as:  DILAUDID Take 1 tablet (4 mg total) by mouth every 4 (four) hours as needed for  moderate pain or severe pain.   isosorbide mononitrate 120 MG 24 hr tablet Commonly known as:  IMDUR Take 1 tablet (120 mg total) by mouth every morning.   metFORMIN 1000 MG tablet Commonly known as:  GLUCOPHAGE Take 1 tablet (1,000 mg total) by mouth 2 (two) times daily with a meal.   methocarbamol 750 MG tablet Commonly known as:  ROBAXIN Take 1 tablet (750 mg total) by mouth every 6 (six) hours as needed for muscle spasms.   metoprolol succinate 50 MG 24 hr tablet Commonly known as:  TOPROL-XL Take 1 tablet (50 mg total) by mouth daily. Take with or immediately following a meal. Hold for SBP <110 ID<60   nitroGLYCERIN 0.4 MG SL tablet Commonly known as:  NITROSTAT Place 1 tablet (0.4 mg total) under the tongue every 5 (five) minutes as needed for chest pain.   pantoprazole 40 MG tablet Commonly known as:  PROTONIX Take 1  tablet (40 mg total) by mouth every morning.   rOPINIRole 1 MG tablet Commonly known as:  REQUIP 1.5 tablets by mouth at bedtime for restless legs   rosuvastatin 10 MG tablet Commonly known as:  CRESTOR TAKE 1 TAB (10 MG) BY MOUTH EVERY OTHER DAY       Immunizations: Immunization History  Administered Date(s) Administered  . Influenza Whole 02/09/2009, 05/19/2011  . Influenza,inj,Quad PF,36+ Mos 03/18/2013, 02/13/2014, 02/15/2015  . PPD Test 03/24/2016  . Pneumococcal Conjugate-13 04/26/2015  . Pneumococcal Polysaccharide-23 11/09/2008, 03/23/2016  . Tdap 02/05/2004     Physical Exam:  Vitals:   03/25/16 1226  BP: 122/76  Pulse: 91  Resp: 20  Temp: 99 F (37.2 C)  TempSrc: Oral  SpO2: 95%  Weight: 203 lb (92.1 kg)  Height: 5\' 10"  (1.778 m)   Body mass index is 29.13 kg/m.  General- adult male, well built, in no acute distress Head- normocephalic, atraumatic Nose- no maxillary or frontal sinus tenderness, no nasal discharge Throat- moist mucus membrane Eyes- PERRLA, EOMI, no pallor, no icterus, no discharge, normal conjunctiva, normal sclera Neck- no cervical lymphadenopathy Cardiovascular- normal s1,s2, no murmur Respiratory- bilateral clear to auscultation, no wheeze, no rhonchi, no crackles, no use of accessory muscles Abdomen- bowel sounds present, soft, non tender Musculoskeletal- able to move all 4 extremities, limited right knee range of motion, 1+ right leg and trace left leg edema Neurological- alert and oriented to person, place and time Skin- warm and dry, surgical incision to right knee with staples in place, old surgical scar to left knee Psychiatry- normal mood and affect    Labs reviewed: Basic Metabolic Panel:  Recent Labs  10/22/15 1054 03/11/16 1201 03/22/16 0604  NA 136 138 136  K 4.7 4.2 4.4  CL 101 105 101  CO2 27 23 27   GLUCOSE 99 195* 144*  BUN 16 11 15   CREATININE 0.97 0.96 1.20  CALCIUM 9.6 9.6 8.8*   Liver Function  Tests:  Recent Labs  07/20/15 1035 10/22/15 1054 03/11/16 1201  AST 20 19 58*  ALT 31 34 90*  ALKPHOS 56 41 49  BILITOT 0.5 0.4 0.7  PROT 6.7 6.7 7.0  ALBUMIN 4.2 4.0 4.2   No results for input(s): LIPASE, AMYLASE in the last 8760 hours. No results for input(s): AMMONIA in the last 8760 hours. CBC:  Recent Labs  05/08/15 1356  03/11/16 1201 03/22/16 0604 03/23/16 0347 03/24/16 0443  WBC 6.2  < > 4.5 6.1 7.6 6.6  NEUTROABS 3.8  --  2.1  --   --   --  HGB 17.2*  < > 15.2 13.3 12.9* 11.9*  HCT 49.8  < > 46.0 41.7 39.4 36.0*  MCV 94.3  < > 92.4 91.4 90.6 90.5  PLT 143*  < > 171 128* 136* 129*  < > = values in this interval not displayed. Cardiac Enzymes: No results for input(s): CKTOTAL, CKMB, CKMBINDEX, TROPONINI in the last 8760 hours. BNP: Invalid input(s): POCBNP CBG:  Recent Labs  03/24/16 0626 03/24/16 1140 03/24/16 1700  GLUCAP 127* 155* 163*    Radiological Exams: No results found.  Assessment/Plan  Unsteady gait With right knee OA. Will have patient work with PT/OT as tolerated to regain strength and restore function.  Fall precautions are in place.  Right knee OA S/p right knee arthroplasty. Has orthopedic follow up. Will have him work with physical therapy and occupational therapy team to help with gait training and muscle strengthening exercises.fall precautions. Skin care. Encourage to be out of bed. Continue his celebrex, dilaudid as needed for pain. Get PMR consult. Change his robaxin to 750 mg tid for now for muscle spasm.   Blood loss anemia Post op, monitor cbc  Thrombocytopenia No bleed reported. Monitor platelet count  Constipation Add colace 100 mg bid and miralax daily to help with bowel movement.   CAD Has history of NSTEMI. Continue imdur 120 mg daily, toprol xl 50 mg daily with statin and monitor. Continue prn ntg and statin  HTN Monitor bp, continue toprol xl 50 mg daily with imdur and norvasc  gerd Continue  pantoprazole  Dm type 2 Lab Results  Component Value Date   HGBA1C 7.3 (H) 03/11/2016    Continue his invokana, amaryl and metformin. Monitor cbg.   Neuropathic pain Currently on trial of gabapentin taper, monitor him clinically.  RLS Continue requip   Goals of care: short term rehabilitation   Labs/tests ordered: cbc, cmp 03/31/16  Family/ staff Communication: reviewed care plan with patient and nursing supervisor    Blanchie Serve, MD Internal Medicine [place], [place] [address] Cell Phone (Monday-Friday 8 am - 5 pm): [phone] On Call: [phone] and follow prompts after 5 pm and on weekends Office Phone: [phone] Office Fax: [phone]

## 2016-03-26 DIAGNOSIS — R600 Localized edema: Secondary | ICD-10-CM | POA: Diagnosis not present

## 2016-03-26 DIAGNOSIS — Z96651 Presence of right artificial knee joint: Secondary | ICD-10-CM | POA: Diagnosis not present

## 2016-03-26 DIAGNOSIS — R2681 Unsteadiness on feet: Secondary | ICD-10-CM | POA: Diagnosis not present

## 2016-03-26 DIAGNOSIS — M6281 Muscle weakness (generalized): Secondary | ICD-10-CM | POA: Diagnosis not present

## 2016-03-26 DIAGNOSIS — M25561 Pain in right knee: Secondary | ICD-10-CM | POA: Diagnosis not present

## 2016-03-27 DIAGNOSIS — M6281 Muscle weakness (generalized): Secondary | ICD-10-CM | POA: Diagnosis not present

## 2016-03-27 DIAGNOSIS — Z96651 Presence of right artificial knee joint: Secondary | ICD-10-CM | POA: Diagnosis not present

## 2016-03-27 DIAGNOSIS — R2681 Unsteadiness on feet: Secondary | ICD-10-CM | POA: Diagnosis not present

## 2016-03-27 DIAGNOSIS — M25561 Pain in right knee: Secondary | ICD-10-CM | POA: Diagnosis not present

## 2016-03-27 DIAGNOSIS — R600 Localized edema: Secondary | ICD-10-CM | POA: Diagnosis not present

## 2016-03-28 DIAGNOSIS — M6281 Muscle weakness (generalized): Secondary | ICD-10-CM | POA: Diagnosis not present

## 2016-03-28 DIAGNOSIS — R2681 Unsteadiness on feet: Secondary | ICD-10-CM | POA: Diagnosis not present

## 2016-03-28 DIAGNOSIS — R600 Localized edema: Secondary | ICD-10-CM | POA: Diagnosis not present

## 2016-03-28 DIAGNOSIS — M25561 Pain in right knee: Secondary | ICD-10-CM | POA: Diagnosis not present

## 2016-03-28 DIAGNOSIS — Z96651 Presence of right artificial knee joint: Secondary | ICD-10-CM | POA: Diagnosis not present

## 2016-03-31 ENCOUNTER — Other Ambulatory Visit: Payer: Self-pay | Admitting: Internal Medicine

## 2016-03-31 DIAGNOSIS — M6281 Muscle weakness (generalized): Secondary | ICD-10-CM | POA: Diagnosis not present

## 2016-03-31 DIAGNOSIS — E0829 Diabetes mellitus due to underlying condition with other diabetic kidney complication: Secondary | ICD-10-CM | POA: Diagnosis not present

## 2016-03-31 DIAGNOSIS — R2681 Unsteadiness on feet: Secondary | ICD-10-CM | POA: Diagnosis not present

## 2016-03-31 DIAGNOSIS — M25561 Pain in right knee: Secondary | ICD-10-CM | POA: Diagnosis not present

## 2016-03-31 DIAGNOSIS — Z96651 Presence of right artificial knee joint: Secondary | ICD-10-CM | POA: Diagnosis not present

## 2016-03-31 DIAGNOSIS — R600 Localized edema: Secondary | ICD-10-CM | POA: Diagnosis not present

## 2016-03-31 LAB — CBC AND DIFFERENTIAL
HEMATOCRIT: 41 % (ref 41–53)
Hemoglobin: 13.4 g/dL — AB (ref 13.5–17.5)
NEUTROS ABS: 3 /uL
Platelets: 240 10*3/uL (ref 150–399)
WBC: 4.8 10^3/mL

## 2016-03-31 LAB — BASIC METABOLIC PANEL
BUN: 17 mg/dL (ref 4–21)
Creatinine: 0.8 mg/dL (ref 0.6–1.3)
Glucose: 86 mg/dL
Potassium: 4.4 mmol/L (ref 3.4–5.3)
SODIUM: 142 mmol/L (ref 137–147)

## 2016-03-31 MED ORDER — ISOSORBIDE MONONITRATE ER 120 MG PO TB24: 120.0000 mg | ORAL_TABLET | Freq: Every morning | ORAL | 3 refills | Status: DC

## 2016-03-31 MED ORDER — AMLODIPINE BESYLATE 10 MG PO TABS: 10.0000 mg | ORAL_TABLET | Freq: Every day | ORAL | 3 refills | Status: DC

## 2016-03-31 MED ORDER — PANTOPRAZOLE SODIUM 40 MG PO TBEC: 40.0000 mg | DELAYED_RELEASE_TABLET | Freq: Every morning | ORAL | 3 refills | Status: DC

## 2016-04-01 ENCOUNTER — Encounter: Payer: Self-pay | Admitting: Adult Health

## 2016-04-01 ENCOUNTER — Non-Acute Institutional Stay (SKILLED_NURSING_FACILITY): Payer: Medicare Other | Admitting: Adult Health

## 2016-04-01 DIAGNOSIS — R2681 Unsteadiness on feet: Secondary | ICD-10-CM | POA: Diagnosis not present

## 2016-04-01 DIAGNOSIS — E669 Obesity, unspecified: Secondary | ICD-10-CM

## 2016-04-01 DIAGNOSIS — I10 Essential (primary) hypertension: Secondary | ICD-10-CM | POA: Diagnosis not present

## 2016-04-01 DIAGNOSIS — E7800 Pure hypercholesterolemia, unspecified: Secondary | ICD-10-CM

## 2016-04-01 DIAGNOSIS — E1169 Type 2 diabetes mellitus with other specified complication: Secondary | ICD-10-CM | POA: Diagnosis not present

## 2016-04-01 DIAGNOSIS — K5901 Slow transit constipation: Secondary | ICD-10-CM | POA: Diagnosis not present

## 2016-04-01 DIAGNOSIS — K219 Gastro-esophageal reflux disease without esophagitis: Secondary | ICD-10-CM | POA: Diagnosis not present

## 2016-04-01 DIAGNOSIS — I25118 Atherosclerotic heart disease of native coronary artery with other forms of angina pectoris: Secondary | ICD-10-CM | POA: Diagnosis not present

## 2016-04-01 DIAGNOSIS — Z96651 Presence of right artificial knee joint: Secondary | ICD-10-CM | POA: Diagnosis not present

## 2016-04-01 DIAGNOSIS — M1711 Unilateral primary osteoarthritis, right knee: Secondary | ICD-10-CM | POA: Diagnosis not present

## 2016-04-01 DIAGNOSIS — M6281 Muscle weakness (generalized): Secondary | ICD-10-CM | POA: Diagnosis not present

## 2016-04-01 DIAGNOSIS — G2581 Restless legs syndrome: Secondary | ICD-10-CM

## 2016-04-01 DIAGNOSIS — G6289 Other specified polyneuropathies: Secondary | ICD-10-CM | POA: Diagnosis not present

## 2016-04-01 DIAGNOSIS — M25561 Pain in right knee: Secondary | ICD-10-CM | POA: Diagnosis not present

## 2016-04-01 DIAGNOSIS — R600 Localized edema: Secondary | ICD-10-CM | POA: Diagnosis not present

## 2016-04-01 NOTE — Progress Notes (Signed)
DATE:  04/01/2016   MRN:  [id number]  BIRTHDAY: Dec 26, 1955  Facility:  [facility]ursing Home Location:  [facility] Room Number: 507-P  LEVEL OF CARE:  SNF (31)  Contact Information    Name Relation Home Work [place] Father [phone]  [phone]   Loni Muse;Lly    [phone]       Code Status History    Date Active Date Inactive Code Status Order ID Comments User Context   03/21/2016  4:29 PM 03/24/2016 11:07 PM Full Code [id number]  Chriss Czar, PA-C Inpatient   05/18/2015  2:46 PM 05/21/2015  6:54 PM Full Code [id number]  Chriss Czar, PA-C Inpatient       Chief Complaint  Patient presents with  . Discharge Note    HISTORY OF PRESENT ILLNESS:  This is a 60 year old male who is for discharge home with Home health PT.  He has been admitted to [facility] on 03/24/16 from [facility] hospitalization 03/21/16 thru 03/24/16. He has right knee osteoarthritis for which she had right total knee arthroplasty.  Patient was admitted to this facility for short-term rehabilitation after the patient's recent hospitalization.  Patient has completed SNF rehabilitation and therapy has cleared the patient for discharge.   PAST MEDICAL HISTORY:  Past Medical History:  Diagnosis Date  . Arthritis   . Bladder cancer [facility]) followed by dr Risa Grill  . Coronary artery disease    followed by Dr. [name]; Lexiscan Myoview (10/14):  Low risk, EF 57%, small inf infarct  . Coronary vasospasm ([place]) PER DR ROSS NOTE 03-22-2012--  INTER. TIGHTNESS   PER PT PROBABLE FROM STRESS  . Depression    no rx  . Diabetes mellitus    followed by Dr. Dwyane Dee  . Dyslipidemia    pt unable to tolerate niaspan  . GERD (gastroesophageal reflux disease)   . History of angina CHRONIC -- CONTROLLED W/ IMDUR  . 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 History of non-ST elevation myocardial infarction (NSTEMI)  2005  . Hypertension   . Myocardial infarction   . OSA on CPAP   . Peripheral neuropathy (HCC) RIGHT LEG NUMBNESS  . Pneumonia      CURRENT MEDICATIONS: Reviewed  Patient's Medications  New Prescriptions   No medications on file  Previous Medications   AMLODIPINE (NORVASC) 10 MG TABLET    Take 1 tablet (10 mg total) by mouth daily. Hold for SBP < 110   APIXABAN (ELIQUIS) 2.5 MG TABS TABLET    Take 1 tablet (2.5 mg total) by mouth 2 (two) times daily.   CANAGLIFLOZIN (INVOKANA) 300 MG TABS TABLET    Take 1 tablet (300 mg total) by mouth daily.   DOCUSATE SODIUM (COLACE) 100 MG CAPSULE    Take 100 mg by mouth.   GABAPENTIN (NEURONTIN) 300 MG CAPSULE    Take 1 capsule (300 mg total) by mouth 3 (three) times daily. x3 weeks then bid x3 days, then 1 tab daily x4 days then discontinue   GLIMEPIRIDE (AMARYL) 2 MG TABLET    TAKE 1 BY MOUTH DAILY BEFORE SUPPER   HYDROMORPHONE (DILAUDID) 4 MG TABLET    Take 1 tablet (4 mg total) by mouth every 4 (four) hours as needed for moderate pain or severe pain.   ISOSORBIDE MONONITRATE (IMDUR) 120 MG 24 HR TABLET    Take 1 tablet (120 mg total) by mouth every morning.  METFORMIN (GLUCOPHAGE) 1000 MG TABLET    Take 1 tablet (1,000 mg total) by mouth 2 (two) times daily with a meal.   METHOCARBAMOL (ROBAXIN) 750 MG TABLET    Take 750 mg by mouth 3 (three) times daily. Take at 6AM, 2PM, 10PM   METOPROLOL SUCCINATE (TOPROL-XL) 50 MG 24 HR TABLET    Take 1 tablet (50 mg total) by mouth daily. Take with or immediately following a meal. Hold for SBP <110 ID<60   NITROGLYCERIN (NITROSTAT) 0.4 MG SL TABLET    Place 1 tablet (0.4 mg total) under the tongue every 5 (five) minutes as needed for chest pain.   PANTOPRAZOLE (PROTONIX) 40 MG TABLET    Take 1 tablet (40 mg total) by mouth every morning.   POLYETHYLENE GLYCOL (MIRALAX / GLYCOLAX) PACKET    Take 17 g by mouth daily.   ROPINIROLE (REQUIP) 1 MG TABLET    1.5 tablets by mouth at bedtime for restless legs    ROSUVASTATIN (CRESTOR) 10 MG TABLET    TAKE 1 TAB (10 MG) BY MOUTH EVERY OTHER DAY  Modified Medications   No medications on file  Discontinued Medications   METHOCARBAMOL (ROBAXIN) 750 MG TABLET    Take 1 tablet (750 mg total) by mouth every 6 (six) hours as needed for muscle spasms.     No Known Allergies   REVIEW OF SYSTEMS:  GENERAL: no change in appetite, no fatigue, no weight changes, no fever, chills or weakness EYES: Denies change in vision, dry eyes, eye pain, itching or discharge EARS: Denies change in hearing, ringing in ears, or earache NOSE: Denies nasal congestion or epistaxis MOUTH and THROAT: Denies oral discomfort, gingival pain or bleeding, pain from teeth or hoarseness   RESPIRATORY: no cough, SOB, DOE, wheezing, hemoptysis CARDIAC: no chest pain, edema or palpitations GI: no abdominal pain, diarrhea, constipation, heart burn, nausea or vomiting GU: Denies dysuria, frequency, hematuria, incontinence, or discharge PSYCHIATRIC: Denies feeling of depression or anxiety. No report of hallucinations, insomnia, paranoia, or agitation     PHYSICAL EXAMINATION  GENERAL APPEARANCE: Well nourished. In no acute distress. Normal body habitus SKIN:  Right knee surgical incision has staples and dry dressing, no redness HEAD: Normal in size and contour. No evidence of trauma EYES: Lids open and close normally. No blepharitis, entropion or ectropion. PERRL. Conjunctivae are clear and sclerae are white. Lenses are without opacity EARS: Pinnae are normal. Patient hears normal voice tunes of the examiner MOUTH and THROAT: Lips are without lesions. Oral mucosa is moist and without lesions. Tongue is normal in shape, size, and color and without lesions NECK: supple, trachea midline, no neck masses, no thyroid tenderness, no thyromegaly LYMPHATICS: no LAN in the neck, no supraclavicular LAN RESPIRATORY: breathing is even & unlabored, BS CTAB CARDIAC: RRR, no murmur,no extra heart  sounds, no edema GI: abdomen soft, normal BS, no masses, no tenderness, no hepatomegaly, no splenomegaly EXTREMITIES:  Able to move X 4 extremities PSYCHIATRIC: Alert and oriented X 3. Affect and behavior are appropriate  LABS/RADIOLOGY: Labs reviewed: Basic Metabolic Panel:  Recent Labs  10/22/15 1054 03/11/16 1201 03/22/16 0604 03/31/16  NA 136 138 136 142  K 4.7 4.2 4.4 4.4  CL 101 105 101  --   CO2 27 23 27   --   GLUCOSE 99 195* 144*  --   BUN 16 11 15 17   CREATININE 0.97 0.96 1.20 0.8  CALCIUM 9.6 9.6 8.8*  --    Liver Function Tests:  Recent  Labs  07/20/15 1035 10/22/15 1054 03/11/16 1201  AST 20 19 58*  ALT 31 34 90*  ALKPHOS 56 41 49  BILITOT 0.5 0.4 0.7  PROT 6.7 6.7 7.0  ALBUMIN 4.2 4.0 4.2   CBC:  Recent Labs  05/08/15 1356  03/11/16 1201 03/22/16 0604 03/23/16 0347 03/24/16 0443 03/31/16  WBC 6.2  < > 4.5 6.1 7.6 6.6 4.8  NEUTROABS 3.8  --  2.1  --   --   --  3  HGB 17.2*  < > 15.2 13.3 12.9* 11.9* 13.4*  HCT 49.8  < > 46.0 41.7 39.4 36.0* 41  MCV 94.3  < > 92.4 91.4 90.6 90.5  --   PLT 143*  < > 171 128* 136* 129* 240  < > = values in this interval not displayed.  Lipid Panel:  Recent Labs  07/20/15 1035 10/22/15 1054  HDL 29.90* 38.10*   CBG:  Recent Labs  03/24/16 0626 03/24/16 1140 03/24/16 1700  GLUCAP 127* 155* 163*       ASSESSMENT/PLAN:  Unsteady gait - for home health PT, for therapeutic strengthening exercises; follow-up precaution  Right knee osteoarthritis S/P right total knee arthroplasty - for home health PT, for therapeutic strengthening exercises; follow-up with orthopedic surgeon; change Robaxin 750 mg 1 tab by mouth Q 8 hours PRN for muscle spasm; Dilaudid 4 mg 1 tab by mouth every 4 hours when necessary for pain  GERD - continue Protonix 40 mg 1 tab by mouth daily  Hypertension - well controlled; continue Norvasc 10 mg 1 tab by mouth daily, Imdur ER 120 mg 1 tab by mouth daily and metoprolol succinate ER  50 mg 1 tab by mouth daily  Diabetes mellitus, type II - continue Invokana 300 mg 1 tab by mouth daily Lab Results  Component Value Date   HGBA1C 7.3 (H) 03/11/2016   Hyperlipidemia - continue Crestor 10 mg 1 tab by mouth 4 times a day third day at at bedtime,, revealed 2 mg 1 tab by mouth before supper and Glucophage 1000 mg 1 tab by mouth twice a day Lab Results  Component Value Date   CHOL 128 10/22/2015   HDL 38.10 (L) 10/22/2015   LDLCALC 58 10/22/2015   LDLDIRECT 96.0 07/20/2015   TRIG 156.0 (H) 10/22/2015   CHOLHDL 3 10/22/2015   Restless leg syndrome - continue Requip 1 mg take 1/1/2 tab = 1.5 mg by mouth daily at bedtime  Neuropathic pain - continue gabapentin 300 mg 1 capsule by mouth TID  Constipation - continue Colace 100 mg 1 capsule by mouth twice a day and MiraLAX 17 g by mouth daily  CAD - has hx of NSTEMI ; stable, continue Imdur ER 120 mg 1 tab by mouth daily, Eliquis 2.5 mg 1 tab by mouth twice a day and NTG when necessary      I have filled out patient's discharge paperwork and written prescriptions.  Patient will receive home health PT.  DME provided:  None  Total discharge time: Less than 30 minutes  Discharge time involved coordination of the discharge process with social worker, nursing staff and therapy department. Medical justification for home health services verified.     [employer] [phone]

## 2016-04-04 DIAGNOSIS — E119 Type 2 diabetes mellitus without complications: Secondary | ICD-10-CM | POA: Diagnosis not present

## 2016-04-07 DIAGNOSIS — M1711 Unilateral primary osteoarthritis, right knee: Secondary | ICD-10-CM | POA: Diagnosis not present

## 2016-04-08 DIAGNOSIS — E1142 Type 2 diabetes mellitus with diabetic polyneuropathy: Secondary | ICD-10-CM | POA: Diagnosis not present

## 2016-04-08 DIAGNOSIS — I25118 Atherosclerotic heart disease of native coronary artery with other forms of angina pectoris: Secondary | ICD-10-CM | POA: Diagnosis not present

## 2016-04-08 DIAGNOSIS — I10 Essential (primary) hypertension: Secondary | ICD-10-CM | POA: Diagnosis not present

## 2016-04-08 DIAGNOSIS — Z471 Aftercare following joint replacement surgery: Secondary | ICD-10-CM | POA: Diagnosis not present

## 2016-04-08 DIAGNOSIS — G4733 Obstructive sleep apnea (adult) (pediatric): Secondary | ICD-10-CM | POA: Diagnosis not present

## 2016-04-16 NOTE — Addendum Note (Signed)
Addendum  created 04/16/16 1736 by Oleta Mouse, MD   Anesthesia Intra Blocks edited, Pend clinical note, Sign clinical note

## 2016-04-17 DIAGNOSIS — G4733 Obstructive sleep apnea (adult) (pediatric): Secondary | ICD-10-CM | POA: Diagnosis not present

## 2016-05-03 DIAGNOSIS — M1711 Unilateral primary osteoarthritis, right knee: Secondary | ICD-10-CM | POA: Diagnosis not present

## 2016-05-06 DIAGNOSIS — M1711 Unilateral primary osteoarthritis, right knee: Secondary | ICD-10-CM | POA: Diagnosis not present

## 2016-05-08 ENCOUNTER — Other Ambulatory Visit (HOSPITAL_COMMUNITY): Payer: Self-pay | Admitting: Orthopedic Surgery

## 2016-05-08 ENCOUNTER — Ambulatory Visit (HOSPITAL_COMMUNITY)
Admission: RE | Admit: 2016-05-08 | Discharge: 2016-05-08 | Disposition: A | Discharge status: Home / Self Care | Payer: Medicare Other | Source: Ambulatory Visit | Attending: Cardiology | Admitting: Cardiology

## 2016-05-08 DIAGNOSIS — E785 Hyperlipidemia, unspecified: Secondary | ICD-10-CM | POA: Insufficient documentation

## 2016-05-08 DIAGNOSIS — M1711 Unilateral primary osteoarthritis, right knee: Secondary | ICD-10-CM | POA: Diagnosis not present

## 2016-05-08 DIAGNOSIS — M7989 Other specified soft tissue disorders: Secondary | ICD-10-CM | POA: Diagnosis not present

## 2016-05-08 DIAGNOSIS — Z87891 Personal history of nicotine dependence: Secondary | ICD-10-CM | POA: Insufficient documentation

## 2016-05-08 DIAGNOSIS — I10 Essential (primary) hypertension: Secondary | ICD-10-CM | POA: Insufficient documentation

## 2016-05-08 DIAGNOSIS — E119 Type 2 diabetes mellitus without complications: Secondary | ICD-10-CM | POA: Diagnosis not present

## 2016-05-09 DIAGNOSIS — M1711 Unilateral primary osteoarthritis, right knee: Secondary | ICD-10-CM | POA: Diagnosis not present

## 2016-05-13 DIAGNOSIS — M1711 Unilateral primary osteoarthritis, right knee: Secondary | ICD-10-CM | POA: Diagnosis not present

## 2016-05-15 DIAGNOSIS — E119 Type 2 diabetes mellitus without complications: Secondary | ICD-10-CM | POA: Diagnosis not present

## 2016-05-15 DIAGNOSIS — M1711 Unilateral primary osteoarthritis, right knee: Secondary | ICD-10-CM | POA: Diagnosis not present

## 2016-05-22 DIAGNOSIS — M1711 Unilateral primary osteoarthritis, right knee: Secondary | ICD-10-CM | POA: Diagnosis not present

## 2016-05-23 DIAGNOSIS — M1711 Unilateral primary osteoarthritis, right knee: Secondary | ICD-10-CM | POA: Diagnosis not present

## 2016-05-27 DIAGNOSIS — M1711 Unilateral primary osteoarthritis, right knee: Secondary | ICD-10-CM | POA: Diagnosis not present

## 2016-05-29 DIAGNOSIS — M1711 Unilateral primary osteoarthritis, right knee: Secondary | ICD-10-CM | POA: Diagnosis not present

## 2016-05-30 DIAGNOSIS — M1711 Unilateral primary osteoarthritis, right knee: Secondary | ICD-10-CM | POA: Diagnosis not present

## 2016-06-02 DIAGNOSIS — M1711 Unilateral primary osteoarthritis, right knee: Secondary | ICD-10-CM | POA: Diagnosis not present

## 2016-06-04 DIAGNOSIS — M1711 Unilateral primary osteoarthritis, right knee: Secondary | ICD-10-CM | POA: Diagnosis not present

## 2016-06-12 DIAGNOSIS — M1711 Unilateral primary osteoarthritis, right knee: Secondary | ICD-10-CM | POA: Diagnosis not present

## 2016-06-17 DIAGNOSIS — M1711 Unilateral primary osteoarthritis, right knee: Secondary | ICD-10-CM | POA: Diagnosis not present

## 2016-06-19 DIAGNOSIS — M1711 Unilateral primary osteoarthritis, right knee: Secondary | ICD-10-CM | POA: Diagnosis not present

## 2016-06-24 DIAGNOSIS — M1711 Unilateral primary osteoarthritis, right knee: Secondary | ICD-10-CM | POA: Diagnosis not present

## 2016-06-26 DIAGNOSIS — M1711 Unilateral primary osteoarthritis, right knee: Secondary | ICD-10-CM | POA: Diagnosis not present

## 2016-06-30 DIAGNOSIS — M1711 Unilateral primary osteoarthritis, right knee: Secondary | ICD-10-CM | POA: Diagnosis not present

## 2016-07-02 DIAGNOSIS — M1711 Unilateral primary osteoarthritis, right knee: Secondary | ICD-10-CM | POA: Diagnosis not present

## 2016-07-03 DIAGNOSIS — Z96652 Presence of left artificial knee joint: Secondary | ICD-10-CM | POA: Diagnosis not present

## 2016-07-04 DIAGNOSIS — E119 Type 2 diabetes mellitus without complications: Secondary | ICD-10-CM | POA: Diagnosis not present

## 2016-07-08 DIAGNOSIS — M1711 Unilateral primary osteoarthritis, right knee: Secondary | ICD-10-CM | POA: Diagnosis not present

## 2016-07-11 ENCOUNTER — Telehealth: Payer: Self-pay | Admitting: Internal Medicine

## 2016-07-11 NOTE — Telephone Encounter (Signed)
New Message   Per pt Isaac [name] referred him to Dr. Radford Pax for Sleep because his sleep doctor retired. Requesting a call back

## 2016-07-14 NOTE — Telephone Encounter (Signed)
Called the patient and he informed me he only needed a prescription for requip for his restless leg syndrome. I informed him he would have to have an office visit with doctor Turner.  He is on a cpap already from doctor Clance. Gave him an appointment with doctor Radford Pax on  March 12,2018

## 2016-07-15 DIAGNOSIS — M1711 Unilateral primary osteoarthritis, right knee: Secondary | ICD-10-CM | POA: Diagnosis not present

## 2016-07-18 ENCOUNTER — Encounter: Payer: Self-pay | Admitting: Cardiology

## 2016-07-21 ENCOUNTER — Ambulatory Visit: Payer: Medicare Other | Admitting: Cardiology

## 2016-07-22 DIAGNOSIS — M1711 Unilateral primary osteoarthritis, right knee: Secondary | ICD-10-CM | POA: Diagnosis not present

## 2016-07-29 DIAGNOSIS — M1711 Unilateral primary osteoarthritis, right knee: Secondary | ICD-10-CM | POA: Diagnosis not present

## 2016-08-04 ENCOUNTER — Telehealth: Payer: Self-pay

## 2016-08-04 NOTE — Telephone Encounter (Signed)
No he will need to get this from his PCP if he does not want to be followed for his OSA

## 2016-08-04 NOTE — Telephone Encounter (Signed)
-----   Message from Charlie Pitter, [place] sent at 08/04/2016  5:17 PM EDT ----- Regarding: This patient needs to be rescheduled to Dr. Radford Pax This patient is erroneously on my schedule for Wednesday for restless leg syndrome and sleep apnea - referred to Dr. Radford Pax by Richardson Dopp. Please reschedule. Thanks!

## 2016-08-04 NOTE — Telephone Encounter (Signed)
Rescheduled patient's OV to Dr. Theodosia Blender schedule. Patient states he used to be followed by Dr. Gwenette Greet for CPAP and he private pays his supplies. He does not want a new doctor to treat his sleep apnea. He only wants someone to refill his Requip for RLS. He states if Dr. Radford Pax is unwilling to fill Requip, new appointment will be cancelled.   Dr. Radford Pax - would you be willing to take over prescription?

## 2016-08-05 ENCOUNTER — Telehealth: Payer: Self-pay | Admitting: Internal Medicine

## 2016-08-05 DIAGNOSIS — G2581 Restless legs syndrome: Secondary | ICD-10-CM

## 2016-08-05 MED ORDER — ROPINIROLE HCL 2 MG PO TABS: 2.0000 mg | ORAL_TABLET | Freq: Every day | ORAL | 3 refills | Status: DC

## 2016-08-05 NOTE — Telephone Encounter (Signed)
Patient states he does not need to have appointment as he does not need Dr. Radford Pax to follow OSA. He understands to call if he has any questions or concerns in the future or if he would like to establish for OSA treatment.  He was grateful for assistance.  OV cancelled.

## 2016-08-05 NOTE — Telephone Encounter (Signed)
E-scribed per patient he takes 2mg  QHS. Ok to e-scribe per Dr. Renold Genta.

## 2016-08-05 NOTE — Telephone Encounter (Signed)
Physical and CPE labs scheduled for the beginning of June.

## 2016-08-05 NOTE — Telephone Encounter (Signed)
Patient is calling to ask if you will refill his Requip for him.  He tried to get Dr Radford Pax to do it (she's in Dr. Nevin Bloodgood Ross's office) and she wouldn't do it.  Dr. Radford Pax does Sleep Studies, but she wouldn't refill the Requip for the Restless Leg Syndrome.  Stated that she doesn't treat Restless Leg.    Advised patient that you don't normally do this either, but I would go ahead and ask for the patient.    Best number for contact:  [phone]

## 2016-08-05 NOTE — Telephone Encounter (Signed)
Last physical exam was February 2017. Needs physical exam. May refill Requip until physical exam.

## 2016-08-06 ENCOUNTER — Ambulatory Visit: Payer: Medicare Other | Admitting: Physician Assistant

## 2016-08-06 ENCOUNTER — Ambulatory Visit: Payer: Medicare Other | Admitting: Cardiology

## 2016-08-07 DIAGNOSIS — M1711 Unilateral primary osteoarthritis, right knee: Secondary | ICD-10-CM | POA: Diagnosis not present

## 2016-08-18 DIAGNOSIS — R310 Gross hematuria: Secondary | ICD-10-CM | POA: Diagnosis not present

## 2016-08-18 DIAGNOSIS — Z8551 Personal history of malignant neoplasm of bladder: Secondary | ICD-10-CM | POA: Diagnosis not present

## 2016-08-18 DIAGNOSIS — R3912 Poor urinary stream: Secondary | ICD-10-CM | POA: Diagnosis not present

## 2016-08-28 DIAGNOSIS — H25013 Cortical age-related cataract, bilateral: Secondary | ICD-10-CM | POA: Diagnosis not present

## 2016-08-28 DIAGNOSIS — H04123 Dry eye syndrome of bilateral lacrimal glands: Secondary | ICD-10-CM | POA: Diagnosis not present

## 2016-08-28 DIAGNOSIS — E119 Type 2 diabetes mellitus without complications: Secondary | ICD-10-CM | POA: Diagnosis not present

## 2016-08-28 DIAGNOSIS — H2513 Age-related nuclear cataract, bilateral: Secondary | ICD-10-CM | POA: Diagnosis not present

## 2016-10-02 DIAGNOSIS — M1711 Unilateral primary osteoarthritis, right knee: Secondary | ICD-10-CM | POA: Diagnosis not present

## 2016-10-10 ENCOUNTER — Other Ambulatory Visit: Payer: Self-pay | Admitting: Internal Medicine

## 2016-10-10 ENCOUNTER — Other Ambulatory Visit: Payer: Medicare Other | Admitting: Internal Medicine

## 2016-10-10 DIAGNOSIS — E1169 Type 2 diabetes mellitus with other specified complication: Secondary | ICD-10-CM | POA: Diagnosis not present

## 2016-10-10 DIAGNOSIS — I10 Essential (primary) hypertension: Secondary | ICD-10-CM

## 2016-10-10 DIAGNOSIS — E669 Obesity, unspecified: Secondary | ICD-10-CM | POA: Diagnosis not present

## 2016-10-10 DIAGNOSIS — C679 Malignant neoplasm of bladder, unspecified: Secondary | ICD-10-CM

## 2016-10-10 DIAGNOSIS — E7800 Pure hypercholesterolemia, unspecified: Secondary | ICD-10-CM

## 2016-10-10 DIAGNOSIS — Z0000 Encounter for general adult medical examination without abnormal findings: Secondary | ICD-10-CM

## 2016-10-10 DIAGNOSIS — R5383 Other fatigue: Secondary | ICD-10-CM | POA: Diagnosis not present

## 2016-10-10 DIAGNOSIS — G2581 Restless legs syndrome: Secondary | ICD-10-CM

## 2016-10-11 LAB — PSA: PSA: 0.4 ng/mL (ref <=4.0)

## 2016-10-11 LAB — CBC WITH DIFFERENTIAL/PLATELET
Basophils Absolute: 0 cells/uL (ref 0–200)
Basophils Relative: 0 %
EOS PCT: 2 %
Eosinophils Absolute: 108 cells/uL (ref 15–500)
HCT: 46.1 % (ref 38.5–50.0)
HEMOGLOBIN: 15.1 g/dL (ref 13.2–17.1)
LYMPHS ABS: 1944 {cells}/uL (ref 850–3900)
Lymphocytes Relative: 36 %
MCH: 30.4 pg (ref 27.0–33.0)
MCHC: 32.8 g/dL (ref 32.0–36.0)
MCV: 92.9 fL (ref 80.0–100.0)
MPV: 10.1 fL (ref 7.5–12.5)
Monocytes Absolute: 702 cells/uL (ref 200–950)
Monocytes Relative: 13 %
NEUTROS ABS: 2646 {cells}/uL (ref 1500–7800)
NEUTROS PCT: 49 %
PLATELETS: 201 10*3/uL (ref 140–400)
RBC: 4.96 MIL/uL (ref 4.20–5.80)
RDW: 14.7 % (ref 11.0–15.0)
WBC: 5.4 10*3/uL (ref 3.8–10.8)

## 2016-10-11 LAB — MICROALBUMIN / CREATININE URINE RATIO
CREATININE, URINE: 30 mg/dL (ref 20–370)
MICROALB UR: 1 mg/dL
MICROALB/CREAT RATIO: 33 ug/mg{creat} — AB (ref <30)

## 2016-10-11 LAB — COMPLETE METABOLIC PANEL WITH GFR
ALK PHOS: 50 U/L (ref 40–115)
ALT: 57 U/L — ABNORMAL HIGH (ref 9–46)
AST: 37 U/L — ABNORMAL HIGH (ref 10–35)
Albumin: 4 g/dL (ref 3.6–5.1)
BILIRUBIN TOTAL: 0.4 mg/dL (ref 0.2–1.2)
BUN: 17 mg/dL (ref 7–25)
CO2: 23 mmol/L (ref 20–31)
Calcium: 9.3 mg/dL (ref 8.6–10.3)
Chloride: 102 mmol/L (ref 98–110)
Creat: 0.95 mg/dL (ref 0.70–1.25)
GFR, EST NON AFRICAN AMERICAN: 86 mL/min (ref >=60)
Glucose, Bld: 133 mg/dL — ABNORMAL HIGH (ref 65–99)
Potassium: 4.7 mmol/L (ref 3.5–5.3)
Sodium: 137 mmol/L (ref 135–146)
TOTAL PROTEIN: 6.4 g/dL (ref 6.1–8.1)

## 2016-10-11 LAB — LIPID PANEL
Cholesterol: 123 mg/dL (ref <200)
HDL: 30 mg/dL — AB (ref >40)
LDL CALC: 59 mg/dL (ref <100)
TRIGLYCERIDES: 168 mg/dL — AB (ref <150)
Total CHOL/HDL Ratio: 4.1 Ratio (ref <5.0)
VLDL: 34 mg/dL — ABNORMAL HIGH (ref <30)

## 2016-10-11 LAB — HEMOGLOBIN A1C
Hgb A1c MFr Bld: 7.2 % — ABNORMAL HIGH (ref <5.7)
Mean Plasma Glucose: 160 mg/dL

## 2016-10-14 ENCOUNTER — Ambulatory Visit (INDEPENDENT_AMBULATORY_CARE_PROVIDER_SITE_OTHER): Payer: Medicare Other | Admitting: Internal Medicine

## 2016-10-14 ENCOUNTER — Encounter: Payer: Self-pay | Admitting: Internal Medicine

## 2016-10-14 VITALS — BP 112/60 | HR 76 | Temp 98.0°F | Ht 68.25 in | Wt 202.0 lb

## 2016-10-14 DIAGNOSIS — I252 Old myocardial infarction: Secondary | ICD-10-CM

## 2016-10-14 DIAGNOSIS — Z96651 Presence of right artificial knee joint: Secondary | ICD-10-CM | POA: Diagnosis not present

## 2016-10-14 DIAGNOSIS — R319 Hematuria, unspecified: Secondary | ICD-10-CM | POA: Diagnosis not present

## 2016-10-14 DIAGNOSIS — Z0000 Encounter for general adult medical examination without abnormal findings: Secondary | ICD-10-CM

## 2016-10-14 DIAGNOSIS — E7849 Other hyperlipidemia: Secondary | ICD-10-CM

## 2016-10-14 DIAGNOSIS — C679 Malignant neoplasm of bladder, unspecified: Secondary | ICD-10-CM | POA: Diagnosis not present

## 2016-10-14 DIAGNOSIS — E784 Other hyperlipidemia: Secondary | ICD-10-CM

## 2016-10-14 DIAGNOSIS — Z85828 Personal history of other malignant neoplasm of skin: Secondary | ICD-10-CM

## 2016-10-14 DIAGNOSIS — F419 Anxiety disorder, unspecified: Secondary | ICD-10-CM | POA: Diagnosis not present

## 2016-10-14 DIAGNOSIS — Z96652 Presence of left artificial knee joint: Secondary | ICD-10-CM

## 2016-10-14 DIAGNOSIS — I214 Non-ST elevation (NSTEMI) myocardial infarction: Secondary | ICD-10-CM | POA: Diagnosis not present

## 2016-10-14 DIAGNOSIS — G4733 Obstructive sleep apnea (adult) (pediatric): Secondary | ICD-10-CM | POA: Diagnosis not present

## 2016-10-14 DIAGNOSIS — F32A Depression, unspecified: Secondary | ICD-10-CM

## 2016-10-14 DIAGNOSIS — F329 Major depressive disorder, single episode, unspecified: Secondary | ICD-10-CM

## 2016-10-14 DIAGNOSIS — I10 Essential (primary) hypertension: Secondary | ICD-10-CM

## 2016-10-14 DIAGNOSIS — G2581 Restless legs syndrome: Secondary | ICD-10-CM

## 2016-10-14 DIAGNOSIS — E1142 Type 2 diabetes mellitus with diabetic polyneuropathy: Secondary | ICD-10-CM | POA: Diagnosis not present

## 2016-10-14 DIAGNOSIS — I519 Heart disease, unspecified: Secondary | ICD-10-CM

## 2016-10-14 DIAGNOSIS — E1121 Type 2 diabetes mellitus with diabetic nephropathy: Secondary | ICD-10-CM

## 2016-10-14 LAB — POCT URINALYSIS DIPSTICK
Bilirubin, UA: NEGATIVE
Ketones, UA: NEGATIVE
Leukocytes, UA: NEGATIVE
NITRITE UA: NEGATIVE
Protein, UA: NEGATIVE
Spec Grav, UA: 1.01 (ref 1.010–1.025)
UROBILINOGEN UA: 0.2 U/dL
pH, UA: 6 (ref 5.0–8.0)

## 2016-10-14 NOTE — Progress Notes (Signed)
Subjective:    Patient ID: Isaac [name], male    DOB: [birth year], 61 y.o.   MRN: [id number]  HPI 61 year old White Male in today for Medicare wellness and physical examination as well as evaluation of medical problems. He is on disability due to multiple medical issues.  In January 2017 he had left knee total arthroplasty in November 2017 right knee total arthroplasty.  Has some generalized fatigue. TSH checked and is within normal limits. Vitamin D level is normal.  Ferritin checked with history of restless leg syndrome and is within normal limits as well.  Long-standing history of type 2 diabetes mellitus and hyperlipidemia. History of obesity and GE reflux. Has obstructive sleep apnea and essential hypertension.  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 Risa Grill initially diagnosed by Dr. Elyse Jarvis in 2008, history of restless leg syndrome.  His lab work was reviewed today and is within normal limits with the exception of mild SGOT and SGPT. elevation. He occasionally drinks beer. He also has history of fatty liver on ultrasound done by Dr. [name] in 2016.  Dr. Steffanie Dunn is his endocrinologist. He is in [place] has been seeing him for about a year.  He had umbilical hernia repair in 2004, right elbow arthroscopy with open reconstruction 2005, right rotator cuff repair 2008, right knee arthroscopic surgery with debridement 2005, multiple cystoscopies and resection of bladder tumor. Right distal ureterectomy with 3 implantation 2010. Rhinoplasty with modified cartilage graft 2009. Appendectomy 2006  Social history: He is not married. No children. He formerly smoked for many years but quit. 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 He receives disability benefits. Occasional alcohol consumption.  Dr. Herbert Deaner does annual eye exam.  History of left lower lobe pneumonia in 1995. Has had Pneumovax and Prevnar  vaccines.  His most recent recurrence of bladder cancer was November 2013. He had a solitary tumor at the dome of his bladder and pathology revealed this to be a low-grade tumor and noninvasive.  Family history: Father died of an accident at age 36. Father with history of CABG. Mother died at age 76 apparently of accidental carbon monoxide poisoning when she locked her self out of the house and stayed in a running car to keep warm. Maternal grandmother with history of diabetes. One sister. Maternal grandfather with history of lung cancer.  No known drug allergies.  Followed in this practice since 1989.  Had colonoscopy in 2007 by Dr. [name]. Repeat study done in 2013. History of adenomatous colon polyps. Only hyperplastic polyps found in 2013. Five-year follow-up recommended.      Review of Systems generalized fatigue-anxiety depression stable.     Objective:   Physical Exam  Constitutional: He appears well-developed and well-nourished. No distress.  HENT:  Head: Normocephalic and atraumatic.  Right Ear: External ear normal.  Left Ear: External ear normal.  Mouth/Throat: Oropharynx is clear and moist.  Eyes: Conjunctivae and EOM are normal. Pupils are equal, round, and reactive to light. Right eye exhibits no discharge.  Cardiovascular: Normal rate, regular rhythm, normal heart sounds and intact distal pulses.   No murmur heard. Pulmonary/Chest: Effort normal and breath sounds normal.  Genitourinary:  Genitourinary Comments: Deferred to Dr. Risa Grill  Musculoskeletal: He exhibits no edema.  Skin: He is not diaphoretic.  Vitals reviewed.         Assessment & Plan:  Controlled type 2 diabetes mellitus-hemoglobin A1c 7.2% and stable. 3 drug regimen  Essential  hypertension  Hyperlipidemia-treated with statin. Triglycerides elevated at 168. Total cholesterol and LDL are normal.  History of bladder and ureter transitional cell carcinoma. PSA is normal.-Has regular follow-up  with urologist  Status post bilateral knee arthroplasties  History of obstructive sleep apnea-C Pap handled by Pulmonary  Anxiety depression-stable  GE reflux-stable on PPI  Coronary disease status post non-ST elevation MI in 2005-on [place] and metoprolol as well as amlodipine  Peripheral neuropathy-treated with gabapentin  Restless leg syndrome-treated with Requip  Remote history of smoking  Plan: Continue same medications and continue to be followed by cardiology and endocrinology. Return in one year or as needed. We'll also be followed up by urologist.  Subjective:   Patient presents for Medicare Annual/Subsequent preventive examination.  Review Past Medical/Family/Social:see above   Risk Factors  Current exercise habits: tinkers with car a bit Dietary issues discussed: Low fat low carbohydrate reviewed  Cardiac risk factors: Hyperlipidemia, history of MI,  history of smoking, diabetes mellitus, hypertension  Depression Screen  (Note: if answer to either of the following is "Yes", a more complete depression screening is indicated)   Over the past two weeks, have you felt down, depressed or hopeless? No - down sometimes but not hopeless Over the past two weeks, have you felt little interest or pleasure in doing things? No just tired Have you lost interest or pleasure in daily life? No Do you often feel hopeless? No Do you cry easily over simple problems? No   Activities of Daily Living  In your present state of health, do you have any difficulty performing the following activities?:   Driving? No  Managing money? No  Feeding yourself? No  Getting from bed to chair? No  Climbing a flight of stairs? No  Preparing food and eating?: No  Bathing or showering? No  Getting dressed: No  Getting to the toilet? No  Using the toilet:No  Moving around from place to place: No  In the past year have you fallen or had a near fall?:yes one fall with no injury- struck head  but did not go to doctor Are you sexually active? No  Do you have more than one partner? No   Hearing Difficulties: No  Do you often ask people to speak up or repeat themselves? No  Do you experience ringing or noises in your ears? yes Do you have difficulty understanding soft or whispered voices? No  Do you feel that you have a problem with memory? No Do you often misplace items? No    Home Safety:  Do you have a smoke alarm at your residence? Yes Do you have grab bars in the bathroom?no Do you have throw rugs in your house?yes   Cognitive Testing  Alert? Yes Normal Appearance?Yes  Oriented to person? Yes Place? Yes  Time? Yes  Recall of three objects? Yes  Can perform simple calculations? Yes  Displays appropriate judgment?Yes  Can read the correct time from a watch face?Yes   List the Names of Other Physician/Practitioners you currently use:  See referral list for the physicians patient is currently seeing.     Review of Systems: See above   Objective:     General appearance: Appears stated age and mildly obese  Head: Normocephalic, without obvious abnormality, atraumatic  Eyes: conj clear, EOMi PEERLA  Ears: normal TM's and external ear canals both ears  Nose: Nares normal. Septum midline. Mucosa normal. No drainage or sinus tenderness.  Throat: lips, mucosa, and tongue normal; teeth and  gums normal  Neck: no adenopathy, no carotid bruit, no JVD, supple, symmetrical, trachea midline and thyroid not enlarged, symmetric, no tenderness/mass/nodules  No CVA tenderness.  Lungs: clear to auscultation bilaterally  Breasts: normal appearance, no masses or tenderness. Heart: regular rate and rhythm, S1, S2 normal, no murmur, click, rub or gallop  Abdomen: soft, non-tender; bowel sounds normal; no masses, no organomegaly  Musculoskeletal: ROM normal in all joints, no crepitus, no deformity, Normal muscle strengthen. Back  is symmetric, no curvature. Skin: Skin color,  texture, turgor normal. No rashes or lesions  Lymph nodes: Cervical, supraclavicular, and axillary nodes normal.  Neurologic: CN 2 -12 Normal, Normal symmetric reflexes. Normal coordination and gait  Psych: Alert & Oriented x 3, Mood appear stable.    Assessment:    Annual wellness medicare exam   Plan:    During the course of the visit the patient was educated and counseled about appropriate screening and preventive services including:   Annual PSA  Recommend annual flu vaccine  Has had Prevnar pneumococcal vaccines     Patient Instructions (the written plan) was given to the patient.  Medicare Attestation  I have personally reviewed:  The patient's medical and social history  Their use of alcohol, tobacco or illicit drugs  Their current medications and supplements  The patient's functional ability including ADLs,fall risks, home safety risks, cognitive, and hearing and visual impairment  Diet and physical activities  Evidence for depression or mood disorders  The patient's weight, height, BMI, and visual acuity have been recorded in the chart. I have made referrals, counseling, and provided education to the patient based on review of the above and I have provided the patient with a written personalized care plan for preventive services.

## 2016-10-15 LAB — URINALYSIS, MICROSCOPIC ONLY
Bacteria, UA: NONE SEEN [HPF]
CASTS: NONE SEEN [LPF]
Crystals: NONE SEEN [HPF]
SQUAMOUS EPITHELIAL / LPF: NONE SEEN [HPF] (ref <=5)
YEAST: NONE SEEN [HPF]

## 2016-10-15 LAB — FERRITIN: FERRITIN: 41 ng/mL (ref 20–380)

## 2016-10-15 LAB — TSH: TSH: 1.8 m[IU]/L (ref 0.40–4.50)

## 2016-10-15 LAB — VITAMIN D 25 HYDROXY (VIT D DEFICIENCY, FRACTURES): Vit D, 25-Hydroxy: 33 ng/mL (ref 30–100)

## 2016-10-15 LAB — URINE CULTURE: ORGANISM ID, BACTERIA: NO GROWTH

## 2016-10-18 DIAGNOSIS — I214 Non-ST elevation (NSTEMI) myocardial infarction: Secondary | ICD-10-CM | POA: Insufficient documentation

## 2016-10-18 NOTE — Patient Instructions (Signed)
Continue same medications. Continue to see endocrinologist, urologist, ophthalmologist, cardiologist. Return in one year or as needed. Watch diet and try to get more exercise.

## 2016-10-21 DIAGNOSIS — E119 Type 2 diabetes mellitus without complications: Secondary | ICD-10-CM | POA: Diagnosis not present

## 2016-10-23 ENCOUNTER — Telehealth: Payer: Self-pay

## 2016-10-23 DIAGNOSIS — G2581 Restless legs syndrome: Secondary | ICD-10-CM

## 2016-10-23 MED ORDER — ROPINIROLE HCL 2 MG PO TABS: 2.0000 mg | ORAL_TABLET | Freq: Every day | ORAL | 3 refills | Status: DC

## 2016-10-23 NOTE — Telephone Encounter (Signed)
Received fax from [facility] in regards to a refill on Requip for patient. Medication was refilled per Dr. Verlene Mayer request. Sent 90 with 3 refills

## 2016-11-17 DIAGNOSIS — E119 Type 2 diabetes mellitus without complications: Secondary | ICD-10-CM | POA: Diagnosis not present

## 2016-11-17 DIAGNOSIS — Z8551 Personal history of malignant neoplasm of bladder: Secondary | ICD-10-CM | POA: Diagnosis not present

## 2016-11-17 DIAGNOSIS — R310 Gross hematuria: Secondary | ICD-10-CM | POA: Diagnosis not present

## 2016-11-26 DIAGNOSIS — Z85828 Personal history of other malignant neoplasm of skin: Secondary | ICD-10-CM | POA: Diagnosis not present

## 2016-11-26 DIAGNOSIS — L821 Other seborrheic keratosis: Secondary | ICD-10-CM | POA: Diagnosis not present

## 2016-11-26 DIAGNOSIS — L570 Actinic keratosis: Secondary | ICD-10-CM | POA: Diagnosis not present

## 2016-11-26 DIAGNOSIS — D1801 Hemangioma of skin and subcutaneous tissue: Secondary | ICD-10-CM | POA: Diagnosis not present

## 2016-12-11 ENCOUNTER — Ambulatory Visit (INDEPENDENT_AMBULATORY_CARE_PROVIDER_SITE_OTHER): Payer: Medicare Other | Admitting: Internal Medicine

## 2016-12-11 VITALS — BP 116/70 | HR 70 | Temp 97.4°F | Wt 205.0 lb

## 2016-12-11 DIAGNOSIS — J069 Acute upper respiratory infection, unspecified: Secondary | ICD-10-CM | POA: Diagnosis not present

## 2016-12-11 DIAGNOSIS — H6502 Acute serous otitis media, left ear: Secondary | ICD-10-CM

## 2016-12-11 MED ORDER — HYDROCODONE-HOMATROPINE 5-1.5 MG/5ML PO SYRP: 5.0000 mL | ORAL_SOLUTION | Freq: Three times a day (TID) | ORAL | 0 refills | Status: DC | PRN

## 2016-12-11 MED ORDER — AMOXICILLIN 500 MG PO CAPS: 500.0000 mg | ORAL_CAPSULE | Freq: Three times a day (TID) | ORAL | 0 refills | Status: DC

## 2016-12-11 NOTE — Patient Instructions (Addendum)
Amoxicillin 500 mg daily x 10 days. Hycodan one tsp po q   8 hours cough.

## 2016-12-17 DIAGNOSIS — Z8551 Personal history of malignant neoplasm of bladder: Secondary | ICD-10-CM | POA: Diagnosis not present

## 2016-12-17 DIAGNOSIS — R310 Gross hematuria: Secondary | ICD-10-CM | POA: Diagnosis not present

## 2016-12-27 ENCOUNTER — Encounter: Payer: Self-pay | Admitting: Internal Medicine

## 2016-12-27 NOTE — Progress Notes (Signed)
   Subjective:    Patient ID: Isaac [name], male    DOB: 28-Jan-1956, 61 y.o.   MRN: [id number]  HPI 61 year old White Male with multiple medical problems including coronary artery disease, diabetes mellitus, history of bladder cancer, hypertension, sleep apnea, GE reflux, hyperlipidemia in today with respiratory infection symptoms. Has only had these for a couple of days. Some cough and congestion but no documented fever. Has malaise and fatigue.    Review of Systems see above     Objective:   Physical Exam  Skin warm and dry. Nodes none. TMs are clear. Pharynx very slightly injected without exudate. Neck supple without adenopathy. Chest clear to auscultation without rales or wheezing      Assessment & Plan:  Acute URI  Acute left serous otitis media  Plan: Amoxicillin 500 daily for 10 days. Hycodan 1 teaspoon by mouth every 8 hours when necessary cough. Rest and drink plenty of fluids.

## 2016-12-30 ENCOUNTER — Ambulatory Visit (INDEPENDENT_AMBULATORY_CARE_PROVIDER_SITE_OTHER): Payer: Medicare Other | Admitting: Internal Medicine

## 2016-12-30 VITALS — BP 120/70 | HR 68 | Temp 98.2°F | Ht 68.25 in | Wt 207.0 lb

## 2016-12-30 DIAGNOSIS — R310 Gross hematuria: Secondary | ICD-10-CM | POA: Diagnosis not present

## 2016-12-30 DIAGNOSIS — Z23 Encounter for immunization: Secondary | ICD-10-CM | POA: Diagnosis not present

## 2016-12-30 DIAGNOSIS — N200 Calculus of kidney: Secondary | ICD-10-CM | POA: Diagnosis not present

## 2016-12-30 NOTE — Progress Notes (Signed)
   Subjective:    Patient ID: Isaac [name], male    DOB: [birth year], 60 y.o.   MRN: [id number]  HPI Flu vaccine and tDap given today   Review of Systems     Objective:   Physical Exam        Assessment & Plan:

## 2016-12-30 NOTE — Patient Instructions (Signed)
Influenza and TD have immunizations given today

## 2017-01-08 DIAGNOSIS — R310 Gross hematuria: Secondary | ICD-10-CM | POA: Diagnosis not present

## 2017-01-08 DIAGNOSIS — C679 Malignant neoplasm of bladder, unspecified: Secondary | ICD-10-CM | POA: Diagnosis not present

## 2017-01-08 DIAGNOSIS — G4733 Obstructive sleep apnea (adult) (pediatric): Secondary | ICD-10-CM | POA: Diagnosis not present

## 2017-01-21 ENCOUNTER — Other Ambulatory Visit: Payer: Self-pay | Admitting: Urology

## 2017-01-21 DIAGNOSIS — C651 Malignant neoplasm of right renal pelvis: Secondary | ICD-10-CM | POA: Diagnosis not present

## 2017-01-21 DIAGNOSIS — D4111 Neoplasm of uncertain behavior of right renal pelvis: Secondary | ICD-10-CM | POA: Diagnosis not present

## 2017-01-21 DIAGNOSIS — N200 Calculus of kidney: Secondary | ICD-10-CM | POA: Diagnosis not present

## 2017-01-26 ENCOUNTER — Telehealth: Payer: Self-pay | Admitting: Internal Medicine

## 2017-01-26 ENCOUNTER — Other Ambulatory Visit: Payer: Self-pay | Admitting: Urology

## 2017-01-26 NOTE — Telephone Encounter (Signed)
Patient had appt scheduled 10/4. Now has nephrectomy surgery scheduled 10/4. Moved his 25m return ov up to 9/21. Advised to call surgeon and request clearance form be sent if one is needed from cardiology.

## 2017-01-26 NOTE — Telephone Encounter (Signed)
New Message   pt verbalized that he is returning call for rn  To speak to rn about his procedure to get his total right kidney removed

## 2017-01-29 NOTE — Progress Notes (Signed)
Cardiology Office Note   Date:  01/30/2017   ID:  Isaac [name], DOB [birth year], MRN [id number]  PCP:  Elby Showers, MD  Cardiologist:   [name], MD   Pt presents for f/u of chest pain, microvasc dz     History of Present Illness: Isaac [name] is a 61 y.o. 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   Last cath in 2009  Minimal CAD  Myovue in 2014 was low risk  COntinued on ASAn amlodipine, NTG b blocker and statin   I saw the pt in 2016  He was seen in 2017 by Kathleen Argue    Breating OK  Sometimes SOB  Only occsional  No CP  Rarely will have heaviness that wakes  Takes NTG   Does note increased fatigue   Walking  Some   Sched to undergo nephrectomy for renal Ca   Current Outpatient Prescriptions  Medication Sig Dispense Refill  . amLODipine (NORVASC) 10 MG tablet Take 1 tablet (10 mg total) by mouth daily. Hold for SBP < 110 90 tablet 3  . amoxicillin (AMOXIL) 500 MG capsule Take 1 capsule (500 mg total) by mouth 3 (three) times daily. 30 capsule 0  . canagliflozin (INVOKANA) 300 MG TABS tablet Take 1 tablet (300 mg total) by mouth daily. 90 tablet 1  . glimepiride (AMARYL) 2 MG tablet TAKE 1 BY MOUTH DAILY BEFORE SUPPER 90 tablet 1  . HYDROcodone-homatropine (HYCODAN) 5-1.5 MG/5ML syrup Take 5 mLs by mouth every 8 (eight) hours as needed for cough. 120 mL 0  . isosorbide mononitrate (IMDUR) 120 MG 24 hr tablet Take 1 tablet (120 mg total) by mouth every morning. 90 tablet 3  . metFORMIN (GLUCOPHAGE) 1000 MG tablet Take 1 tablet (1,000 mg total) by mouth 2 (two) times daily with a meal. 180 tablet 1  . metoprolol succinate (TOPROL-XL) 50 MG 24 hr tablet Take 1 tablet (50 mg total) by mouth daily. Take with or immediately following a meal. Hold for SBP <110 ID<60 90 tablet 3  . naproxen (NAPROSYN) 500 MG tablet TAKE ONE TABLET BY MOUTH TWICE DAILY AS NEEDED WITH  FOOD  1  . nitroGLYCERIN (NITROSTAT) 0.4 MG SL tablet Place 1 tablet (0.4 mg total) under the tongue every 5 (five) minutes as needed for chest pain. 25 tablet 3  . pantoprazole (PROTONIX) 40 MG tablet Take 1 tablet (40 mg total) by mouth every morning. 90 tablet 3  . rOPINIRole (REQUIP) 2 MG tablet Take 1 tablet (2 mg total) by mouth at bedtime. 90 tablet 3  . rosuvastatin (CRESTOR) 10 MG tablet TAKE 1 TAB (10 MG) BY MOUTH EVERY OTHER DAY 90 tablet 3  . traMADol (ULTRAM) 50 MG tablet TAKE 1 OR 2 TABLETS BY MOUTH EVERY 6 TO 8 HOURS AS NEEDED FOR PAIN (maximum OF EIGHT tablets daily)  0   No current facility-administered medications for this visit.     Allergies:   Patient has no known allergies.   Past Medical History:  Diagnosis Date  . Arthritis   . Bladder cancer [facility]) followed by dr Risa Grill  . Coronary artery disease    followed by Dr. [name]; Lexiscan Myoview (10/14):  Low risk, EF 57%, small inf infarct  . Coronary vasospasm ([place]) PER DR ROSS NOTE 03-22-2012--  INTER. TIGHTNESS   PER PT PROBABLE  FROM STRESS  . Depression    no rx  . Diabetes mellitus    followed by Dr. Dwyane Dee  . Dyslipidemia    pt unable to tolerate niaspan  . GERD (gastroesophageal reflux disease)   . History of angina CHRONIC -- CONTROLLED W/ IMDUR  . 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 History of non-ST elevation myocardial infarction (NSTEMI) 2005  . Hypertension   . Myocardial infarction ([facility])   . OSA on CPAP   . Peripheral neuropathy RIGHT LEG NUMBNESS  . Pneumonia     Past Surgical History:  Procedure Laterality Date  . APPENDECTOMY  08-03-2004  . CARDIAC CATHETERIZATION  x3  last one 06-17-2007   MILD NON-OBSTRUCTIVE CAD/ NORMAL LVF/ 30% LEFT RENAL ARTERY STENOSIS  . CYSTO/ BILATERAL RETROGRADE PYELOGRAM/ RIGHT URETEROSCOPIC LASER FULGURATION URETERAL TUMOR  02-16-2008  . CYSTO/ BLADDER BX  09-13-2008;  08-13-2007; 04-19-2007; 06-01-2006  .  CYSTO/ CYSTOGRAM/ URETEROSCOPY  02-21-2009  . CYSTO/ RESECTION BLADDER TUMOR/ LEFT RETROGRADE PYELOGRAM/ RIGHT URETEROSCOPY  08-07-2010   TCC OF BLADDER   S/P DISTAL URETERECTOMY/ REIMPLANTATION  . CYSTO/ RIGHT URETEROSCOPY/ BX URETERAL TUMOR  10-11-2008  . CYSTOSCOPY W/ RETROGRADES  04/19/2012   Procedure: CYSTOSCOPY WITH RETROGRADE PYELOGRAM;  Surgeon: Bernestine Amass, MD;  Location: [facility];  Service: Urology;  Laterality: Bilateral;  30 MINS Cysto, Biopsy, possible TURBT, Bilateral retrograde pyelograms with Mitomycin C instilation Post op   . CYSTOSCOPY WITH BIOPSY  04/07/2011   Procedure: CYSTOSCOPY WITH BIOPSY/ RIGHT URETEROSCOPY;  Surgeon: Bernestine Amass, MD;  Location[facility];  Service: Urology;  Laterality: N/A;    cystoscopy, cystogram, biopsy and fulgeration  . CYSTOSCOPY WITH BIOPSY  12/29/2011   Procedure: CYSTOSCOPY WITH BIOPSY;  Surgeon: Bernestine Amass, MD;  Location: [facility];  Service: Urology;  Laterality: N/A;  [place]    . ELBOW SURGERY  07-07-2003   RIGHT ELBOW ARTHROSCOPY W/ OPEN RECONSTRUCTION  . KNEE ARTHROSCOPY W/ DEBRIDEMENT  02-08-2004   RIGHT KNEE  . RHINOPLASTY W/ MODIFIED CARTILAGE GRAFT  05-17-2007   INTERNAL NASAL VALVE COLLAPSE/ OSA/ SEPTAL PERFERATION  . RIGHT DISTAL URETERECTOMY W/ REIMPLANTATION  10-30-2008   TCC OF RIGHT DISTAL URETER  . SHOULDER SURGERY  2005   RIGHT ROTATOR CUFF REPAIR  . TOTAL KNEE ARTHROPLASTY Left 05/18/2015  . TOTAL KNEE ARTHROPLASTY Left 05/18/2015   Procedure: TOTAL KNEE ARTHROPLASTY;  Surgeon: Earlie Server, MD;  Location: [place];  Service: Orthopedics;  Laterality: Left;  . TOTAL KNEE ARTHROPLASTY Right 03/21/2016   Procedure: TOTAL KNEE ARTHROPLASTY;  Surgeon: Earlie Server, MD;  Location: [place];  Service: Orthopedics;  Laterality: Right;  . TRANSURETHRAL RESECTION OF BLADDER TUMOR  01-13-2007  . TRANSURETHRAL RESECTION OF BLADDER TUMOR  04/19/2012    Procedure: TRANSURETHRAL RESECTION OF BLADDER TUMOR (TURBT);  Surgeon: Bernestine Amass, MD;  Location: [facility];  Service: Urology;  Laterality: N/A;  . umbillical hernia repair  2004     Social History:  The patient  reports that he quit smoking about 9 years ago. His smoking use included Cigarettes. He has a 10.00 pack-year smoking history. He has never used smokeless tobacco. He reports that he drinks alcohol. He reports that he does not use drugs.   Family History:  The patient's family history includes Coronary artery disease in his father; Diabetes in his maternal grandmother; Heart disease in his paternal grandfather.  ROS:  Please see the history of present illness. All other systems are reviewed and  Negative to the above problem except as noted.    PHYSICAL EXAM: VS:  BP 118/72   Pulse 60   Ht 5' 8.25" (1.734 m)   Wt 206 lb 12.8 oz (93.8 kg)   SpO2 98%   BMI 31.21 kg/m   GEN: Well nourished, well developed, in no acute distress  HEENT: normal  Neck: JVP normal  , carotid bruits, or masses Cardiac: RRR; no murmurs, rubs, or gallops,Tr edema  Respiratory:  clear to auscultation bilaterally, normal work of breathing GI: soft, nontender, nondistended, + BS  No hepatomegaly  MS: no deformity Moving all extremities   Skin: warm and dry, no rash Neuro:  Strength and sensation are intact Psych: euthymic mood, full affect   EKG:  EKG is ordered today  SR 65 bpm  Occasional PACss    Lipid Panel    Component Value Date/Time   CHOL 123 10/10/2016 1043   TRIG 168 (H) 10/10/2016 1043   HDL 30 (L) 10/10/2016 1043   CHOLHDL 4.1 10/10/2016 1043   VLDL 34 (H) 10/10/2016 1043   LDLCALC 59 10/10/2016 1043   LDLDIRECT 96.0 07/20/2015 1035      Wt Readings from Last 3 Encounters:  01/30/17 206 lb 12.8 oz (93.8 kg)  12/30/16 207 lb (93.9 kg)  12/11/16 205 lb (93 kg)      ASSESSMENT AND PLAN: 1  Cardiac Denies CP  Does complain of fatigue   SOme SOB   She  does have sl edema of legs   Will check:  CBC, TSH, BMET, BNP  Also set up for echo  2  CAD  I am not convinced above symtpoms represent angiona     2.  HL  Last LDL was 59   HDL 30  Try to staty active  Keep on crestor.    3.  HTN  Good control    4  Preop eval  From a cardaic standpoint I think he is at low risk for major cardiac event  OK to proceed   I encouraged him to increase his physcial activity on regular basis.   F/u in 1 year    Signed, [name], MD  01/30/2017 11:25 PM    [address], [place], [place]  [address] Phone: [phone]; Fax: [phone]

## 2017-01-30 ENCOUNTER — Ambulatory Visit (INDEPENDENT_AMBULATORY_CARE_PROVIDER_SITE_OTHER): Payer: Medicare Other | Admitting: Internal Medicine

## 2017-01-30 ENCOUNTER — Encounter: Payer: Self-pay | Admitting: Internal Medicine

## 2017-01-30 VITALS — BP 118/72 | HR 60 | Ht 68.25 in | Wt 206.8 lb

## 2017-01-30 DIAGNOSIS — I10 Essential (primary) hypertension: Secondary | ICD-10-CM | POA: Diagnosis not present

## 2017-01-30 DIAGNOSIS — I25118 Atherosclerotic heart disease of native coronary artery with other forms of angina pectoris: Secondary | ICD-10-CM | POA: Diagnosis not present

## 2017-01-30 DIAGNOSIS — R609 Edema, unspecified: Secondary | ICD-10-CM

## 2017-01-30 DIAGNOSIS — Z01810 Encounter for preprocedural cardiovascular examination: Secondary | ICD-10-CM

## 2017-01-30 NOTE — Patient Instructions (Signed)
Your physician recommends that you continue on your current medications as directed. Please refer to the Current Medication list given to you today.  Your physician recommends that you return for lab work in: TODAY (CBC, TSH, BMET, BNP)  Your physician has requested that you have an echocardiogram. Echocardiography is a painless test that uses sound waves to create images of your heart. It provides your doctor with information about the size and shape of your heart and how well your heart's chambers and valves are working. This procedure takes approximately one hour. There are no restrictions for this procedure.  Your physician wants you to follow-up in: [place].  You will receive a reminder letter in the mail two months in advance. If you don't receive a letter, please call our office to schedule the follow-up appointment.

## 2017-01-31 LAB — BASIC METABOLIC PANEL
BUN / CREAT RATIO: 12 (ref 10–24)
BUN: 12 mg/dL (ref 8–27)
CHLORIDE: 99 mmol/L (ref 96–106)
CO2: 23 mmol/L (ref 20–29)
Calcium: 9.2 mg/dL (ref 8.6–10.2)
Creatinine, Ser: 1 mg/dL (ref 0.76–1.27)
GFR calc non Af Amer: 81 mL/min/{1.73_m2} (ref >59)
GFR, EST AFRICAN AMERICAN: 93 mL/min/{1.73_m2} (ref >59)
Glucose: 199 mg/dL — ABNORMAL HIGH (ref 65–99)
POTASSIUM: 5 mmol/L (ref 3.5–5.2)
Sodium: 137 mmol/L (ref 134–144)

## 2017-01-31 LAB — CBC
HEMATOCRIT: 40.7 % (ref 37.5–51.0)
Hemoglobin: 13.2 g/dL (ref 13.0–17.7)
MCH: 28 pg (ref 26.6–33.0)
MCHC: 32.4 g/dL (ref 31.5–35.7)
MCV: 86 fL (ref 79–97)
Platelets: 223 10*3/uL (ref 150–379)
RBC: 4.72 x10E6/uL (ref 4.14–5.80)
RDW: 14.3 % (ref 12.3–15.4)
WBC: 4.9 10*3/uL (ref 3.4–10.8)

## 2017-01-31 LAB — PRO B NATRIURETIC PEPTIDE: NT-Pro BNP: 37 pg/mL (ref 0–210)

## 2017-01-31 LAB — TSH: TSH: 1.51 u[IU]/mL (ref 0.450–4.500)

## 2017-02-03 ENCOUNTER — Ambulatory Visit (HOSPITAL_BASED_OUTPATIENT_CLINIC_OR_DEPARTMENT_OTHER)
Admission: RE | Admit: 2017-02-03 | Discharge: 2017-02-03 | Disposition: A | Discharge status: Home / Self Care | Payer: Medicare Other | Source: Ambulatory Visit | Attending: Internal Medicine | Admitting: Internal Medicine

## 2017-02-03 DIAGNOSIS — E119 Type 2 diabetes mellitus without complications: Secondary | ICD-10-CM | POA: Diagnosis not present

## 2017-02-03 DIAGNOSIS — I2510 Atherosclerotic heart disease of native coronary artery without angina pectoris: Secondary | ICD-10-CM | POA: Insufficient documentation

## 2017-02-03 DIAGNOSIS — C651 Malignant neoplasm of right renal pelvis: Secondary | ICD-10-CM | POA: Diagnosis not present

## 2017-02-03 DIAGNOSIS — R609 Edema, unspecified: Secondary | ICD-10-CM

## 2017-02-03 DIAGNOSIS — C676 Malignant neoplasm of ureteric orifice: Secondary | ICD-10-CM | POA: Diagnosis not present

## 2017-02-03 DIAGNOSIS — C679 Malignant neoplasm of bladder, unspecified: Secondary | ICD-10-CM | POA: Diagnosis not present

## 2017-02-03 HISTORY — PX: TRANSTHORACIC ECHOCARDIOGRAM: SHX275

## 2017-02-03 LAB — ECHOCARDIOGRAM COMPLETE
E/e' ratio: 7.7
EWDT: 306 ms
FS: 28 % (ref 28–44)
IVS/LV PW RATIO, ED: 0.91
LA vol index: 17.2 mL/m2
LA vol: 37 mL
LADIAMINDEX: 1.44 cm/m2
LASIZE: 31 mm
LAVOLA4C: 34.1 mL
LDCA: 3.14 cm2
LEFT ATRIUM END SYS DIAM: 31 mm
LV E/e' medial: 7.7
LV E/e'average: 7.7
LV PW d: 12.1 mm — AB (ref 0.6–1.1)
LV TDI E'LATERAL: 9.57
LV TDI E'MEDIAL: 8.27
LV dias vol index: 53 mL/m2
LVDIAVOL: 114 mL (ref 62–150)
LVELAT: 9.57 cm/s
LVOT SV: 65 mL
LVOT VTI: 20.8 cm
LVOT peak grad rest: 5 mmHg
LVOT peak vel: 116 cm/s
LVOTD: 20 mm
LVSYSVOL: 52 mL (ref 21–61)
LVSYSVOLIN: 24 mL/m2
MV Dec: 306
MV Peak grad: 2 mmHg
MVPKAVEL: 79.7 m/s
MVPKEVEL: 73.7 m/s
RV LATERAL S' VELOCITY: 12.2 cm/s
RV TAPSE: 25.2 mm
Simpson's disk: 54
Stroke v: 62 ml

## 2017-02-04 NOTE — Progress Notes (Addendum)
  01-30-17 (EPIC) Cardiac Clearance from Dr. Harrington Challenger, cardiologist   02-20-16 (EPIC) EKG   02-03-17 (EPIC) ECHO  01-11-17 (EPIC) CBC, BMP

## 2017-02-04 NOTE — Patient Instructions (Addendum)
ROMELLO HOEHN  02/04/2017   Your procedure is scheduled on: 02-12-17  Report to [facility] Main  Entrance Take [place]  Elevators to 3rd floor to  [place] at 5:30 AM.   Call this number if you have problems the morning of surgery [phone]    Remember: ONLY 1 PERSON MAY GO WITH YOU TO SHORT STAY TO GET  READY MORNING OF [place].  Do not eat food or drink liquids :After Midnight.  Please Eat a Clear Liquid Diet the Day Before surgery     CLEAR LIQUID DIET   Foods Allowed                                                                     Foods Excluded  Coffee and tea, regular and decaf                             liquids that you cannot  Plain Jell-O in any flavor                                             see through such as: Fruit ices (not with fruit pulp)                                     milk, soups, orange juice  Iced Popsicles                                    All solid food Carbonated beverages, regular and diet                                    Cranberry, grape and apple juices Sports drinks like Gatorade Lightly seasoned clear broth or consume(fat free) Sugar, honey syrup  Sample Menu Breakfast                                Lunch                                     Supper Cranberry juice                    Beef broth                            Chicken broth Jell-O                                     Grape juice  Apple juice Coffee or tea                        Jell-O                                      Popsicle                                                Coffee or tea                        Coffee or tea  _____________________________________________________________________     Take these medicines the morning of surgery with A SIP OF WATER: Pantoprazole (Protonix) and Isosorbide Mononitrate (Imdur) DO NOT TAKE ANY DIABETIC MEDICATIONS DAY OF YOUR SURGERY                    You may not have any metal on your body including hair pins and              piercings  Do not wear jewelry, make-up, lotions, powders or perfumes, deodorant                          Men may shave face and neck.   Do not bring valuables to the hospital. [place].  Contacts, dentures or bridgework may not be worn into surgery.  Leave suitcase in the car. After surgery it may be brought to your room.   Please bring your mask and tubing for your CPAP machine              Please read over the following fact sheets you were given: _____________________________________________________________________  How to Manage Your Diabetes Before and After Surgery  Why is it important to control my blood sugar before and after surgery? . Improving blood sugar levels before and after surgery helps healing and can limit problems. . A way of improving blood sugar control is eating a healthy diet by: o  Eating less sugar and carbohydrates o  Increasing activity/exercise o  Talking with your doctor about reaching your blood sugar goals . High blood sugars (greater than 180 mg/dL) can raise your risk of infections and slow your recovery, so you will need to focus on controlling your diabetes during the weeks before surgery. . Make sure that the doctor who takes care of your diabetes knows about your planned surgery including the date and location.  How do I manage my blood sugar before surgery? . Check your blood sugar at least 4 times a day, starting 2 days before surgery, to make sure that the level is not too high or low. o Check your blood sugar the morning of your surgery when you wake up and every 2 hours until you get to the Short Stay unit. . If your blood sugar is less than 70 mg/dL, you will need to treat for low blood sugar: o Do not take insulin. o Treat a low blood sugar (less than 70 mg/dL) with  cup of clear juice (cranberry or apple), 4  glucose tablets, OR  glucose gel. o Recheck blood sugar in 15 minutes after treatment (to make sure it is greater than 70 mg/dL). If your blood sugar is not greater than 70 mg/dL on recheck, call [phone] for further instructions. . Report your blood sugar to the short stay nurse when you get to Short Stay.  . If you are admitted to the hospital after surgery: o Your blood sugar will be checked by the staff and you will probably be given insulin after surgery (instead of oral diabetes medicines) to make sure you have good blood sugar levels. o The goal for blood sugar control after surgery is 80-180 mg/dL.   WHAT DO I DO ABOUT MY DIABETES MEDICATION?  Marland Kitchen Do not take oral diabetes medicines (pills) the morning of surgery.  . THE DAY BEFORE SURGERY , take your usual dose of Metformin and Invokana            THE NIGHT BEFORE SURGERY, DO NOT TAKE YOUR SUPPER DOSE OF GLIMEPIRIDE .   Patient Signature:  Date:   Nurse Signature:  Date:   Reviewed and Endorsed by [facility] Patient Education Committee, August 2015           [facility] - Preparing for Surgery Before surgery, you can play an important role.  Because skin is not sterile, your skin needs to be as free of germs as possible.  You can reduce the number of germs on your skin by washing with CHG (chlorahexidine gluconate) soap before surgery.  CHG is an antiseptic cleaner which kills germs and bonds with the skin to continue killing germs even after washing. Please DO NOT use if you have an allergy to CHG or antibacterial soaps.  If your skin becomes reddened/irritated stop using the CHG and inform your nurse when you arrive at Short Stay. Do not shave (including legs and underarms) for at least 48 hours prior to the first CHG shower.  You may shave your face/neck. Please follow these instructions carefully:  1.  Shower with CHG Soap the night before surgery and the  morning of Surgery.  2.  If you choose to wash your hair, wash  your hair first as usual with your  normal  shampoo.  3.  After you shampoo, rinse your hair and body thoroughly to remove the  shampoo.                           4.  Use CHG as you would any other liquid soap.  You can apply chg directly  to the skin and wash                       Gently with a scrungie or clean washcloth.  5.  Apply the CHG Soap to your body ONLY FROM THE NECK DOWN.   Do not use on face/ open                           Wound or open sores. Avoid contact with eyes, ears mouth and genitals (private parts).                       Wash face,  Genitals (private parts) with your normal soap.             6.  Wash thoroughly, paying special attention to the area where your surgery  will be performed.  7.  Thoroughly rinse your body with warm water from the neck down.  8.  DO NOT shower/wash with your normal soap after using and rinsing off  the CHG Soap.                9.  Pat yourself dry with a clean towel.            10.  Wear clean pajamas.            11.  Place clean sheets on your bed the night of your first shower and do not  sleep with pets. Day of Surgery : Do not apply any lotions/deodorants the morning of surgery.  Please wear clean clothes to the hospital/surgery center.  FAILURE TO FOLLOW THESE INSTRUCTIONS MAY RESULT IN THE CANCELLATION OF YOUR SURGERY PATIENT SIGNATURE_________________________________  NURSE SIGNATURE__________________________________  ________________________________________________________________________

## 2017-02-05 ENCOUNTER — Encounter (HOSPITAL_COMMUNITY): Payer: Self-pay

## 2017-02-05 ENCOUNTER — Encounter (HOSPITAL_COMMUNITY)
Admission: RE | Admit: 2017-02-05 | Discharge: 2017-02-05 | Disposition: A | Discharge status: Home / Self Care | Payer: Medicare Other | Source: Ambulatory Visit | Attending: Urology | Admitting: Urology

## 2017-02-05 DIAGNOSIS — Z01818 Encounter for other preprocedural examination: Secondary | ICD-10-CM | POA: Diagnosis present

## 2017-02-05 DIAGNOSIS — R1084 Generalized abdominal pain: Secondary | ICD-10-CM | POA: Diagnosis not present

## 2017-02-05 DIAGNOSIS — C649 Malignant neoplasm of unspecified kidney, except renal pelvis: Secondary | ICD-10-CM | POA: Diagnosis not present

## 2017-02-05 LAB — COMPREHENSIVE METABOLIC PANEL
ALK PHOS: 69 U/L (ref 38–126)
ALT: 63 U/L (ref 17–63)
ANION GAP: 9 (ref 5–15)
AST: 50 U/L — ABNORMAL HIGH (ref 15–41)
Albumin: 3.9 g/dL (ref 3.5–5.0)
BILIRUBIN TOTAL: 0.4 mg/dL (ref 0.3–1.2)
BUN: 11 mg/dL (ref 6–20)
CALCIUM: 9.1 mg/dL (ref 8.9–10.3)
CO2: 24 mmol/L (ref 22–32)
Chloride: 104 mmol/L (ref 101–111)
Creatinine, Ser: 1.06 mg/dL (ref 0.61–1.24)
Glucose, Bld: 205 mg/dL — ABNORMAL HIGH (ref 65–99)
POTASSIUM: 4.3 mmol/L (ref 3.5–5.1)
Sodium: 137 mmol/L (ref 135–145)
TOTAL PROTEIN: 7.3 g/dL (ref 6.5–8.1)

## 2017-02-05 LAB — HEMOGLOBIN A1C
Hgb A1c MFr Bld: 8.5 % — ABNORMAL HIGH (ref 4.8–5.6)
Mean Plasma Glucose: 197.25 mg/dL

## 2017-02-05 LAB — GLUCOSE, CAPILLARY: GLUCOSE-CAPILLARY: 242 mg/dL — AB (ref 65–99)

## 2017-02-06 NOTE — Progress Notes (Signed)
02-05-17 HA1C results routed to Dr. Louis Meckel for review.

## 2017-02-07 LAB — URINE CULTURE: Culture: 70000 — AB

## 2017-02-09 NOTE — Progress Notes (Signed)
02-05-17 Urine culture routed to Dr. Louis Meckel for review.

## 2017-02-11 MED ORDER — DEXTROSE 5 % IV SOLN
5.0000 mg/kg | INTRAVENOUS | Status: AC
  Administered 2017-02-12: 400 mg via INTRAVENOUS
  Filled 2017-02-11 (×2): qty 10

## 2017-02-11 NOTE — Anesthesia Preprocedure Evaluation (Addendum)
Anesthesia Evaluation  Patient identified by MRN, date of birth, ID band Patient awake    Reviewed: Allergy & Precautions, NPO status , Patient's Chart, lab work & pertinent test results  History of Anesthesia Complications Negative for: history of anesthetic complications  Airway Mallampati: III  TM Distance: >3 FB Neck ROM: Full    Dental no notable dental hx. (+) Dental Advisory Given   Pulmonary neg shortness of breath, sleep apnea and Continuous Positive Airway Pressure Ventilation , neg COPD, former smoker,    Pulmonary exam normal        Cardiovascular hypertension, Pt. on medications and Pt. on home beta blockers + CAD and + Past MI  Normal cardiovascular exam  Study Conclusions  - Left ventricle: The cavity size was normal. Wall thickness was   increased in a pattern of mild LVH. Systolic function was normal.   The estimated ejection fraction was in the range of 60% to 65%.   Wall motion was normal; there were no regional wall motion   abnormalities.    Neuro/Psych PSYCHIATRIC DISORDERS Depression  Neuromuscular disease    GI/Hepatic Neg liver ROS, GERD  Medicated and Controlled,  Endo/Other  diabetes, Type 2, Oral Hypoglycemic Agents  Renal/GU      Musculoskeletal  (+) Arthritis ,   Abdominal   Peds  Hematology negative hematology ROS (+)   Anesthesia Other Findings   Reproductive/Obstetrics                           Anesthesia Physical  Anesthesia Plan  ASA: III  Anesthesia Plan: General   Post-op Pain Management:    Induction: Intravenous  PONV Risk Score and Plan: 3 and Ondansetron, Dexamethasone and Scopolamine patch - Pre-op  Airway Management Planned: Natural Airway, Nasal Cannula and Simple Face Mask  Additional Equipment: None  Intra-op Plan:   Post-operative Plan: Extubation in OR  Informed Consent: I have reviewed the patients History and Physical,  chart, labs and discussed the procedure including the risks, benefits and alternatives for the proposed anesthesia with the patient or authorized representative who has indicated his/her understanding and acceptance.   Dental advisory given  Plan Discussed with: Anesthesiologist and CRNA  Anesthesia Plan Comments:        Anesthesia Quick Evaluation

## 2017-02-12 ENCOUNTER — Inpatient Hospital Stay (HOSPITAL_COMMUNITY): Payer: Medicare Other | Admitting: Anesthesiology

## 2017-02-12 ENCOUNTER — Encounter (HOSPITAL_COMMUNITY)
Admission: RE | Disposition: A | Discharge status: Home / Self Care | Payer: Self-pay | Source: Home / Self Care | Attending: Urology

## 2017-02-12 ENCOUNTER — Inpatient Hospital Stay (HOSPITAL_COMMUNITY)
Admission: RE | Admit: 2017-02-12 | Discharge: 2017-02-16 | DRG: 657 | Disposition: A | Discharge status: Home / Self Care | Payer: Medicare Other | Attending: General Surgery | Admitting: General Surgery

## 2017-02-12 ENCOUNTER — Ambulatory Visit: Payer: Medicare Other | Admitting: Internal Medicine

## 2017-02-12 ENCOUNTER — Encounter (HOSPITAL_COMMUNITY): Payer: Self-pay

## 2017-02-12 DIAGNOSIS — Z85828 Personal history of other malignant neoplasm of skin: Secondary | ICD-10-CM | POA: Diagnosis not present

## 2017-02-12 DIAGNOSIS — C679 Malignant neoplasm of bladder, unspecified: Principal | ICD-10-CM | POA: Diagnosis present

## 2017-02-12 DIAGNOSIS — Z794 Long term (current) use of insulin: Secondary | ICD-10-CM | POA: Diagnosis not present

## 2017-02-12 DIAGNOSIS — I10 Essential (primary) hypertension: Secondary | ICD-10-CM | POA: Diagnosis not present

## 2017-02-12 DIAGNOSIS — N2889 Other specified disorders of kidney and ureter: Secondary | ICD-10-CM | POA: Diagnosis present

## 2017-02-12 DIAGNOSIS — E119 Type 2 diabetes mellitus without complications: Secondary | ICD-10-CM | POA: Diagnosis not present

## 2017-02-12 DIAGNOSIS — I252 Old myocardial infarction: Secondary | ICD-10-CM | POA: Diagnosis not present

## 2017-02-12 DIAGNOSIS — R339 Retention of urine, unspecified: Secondary | ICD-10-CM | POA: Diagnosis present

## 2017-02-12 DIAGNOSIS — Z79899 Other long term (current) drug therapy: Secondary | ICD-10-CM | POA: Diagnosis not present

## 2017-02-12 DIAGNOSIS — E785 Hyperlipidemia, unspecified: Secondary | ICD-10-CM | POA: Diagnosis not present

## 2017-02-12 DIAGNOSIS — G4733 Obstructive sleep apnea (adult) (pediatric): Secondary | ICD-10-CM | POA: Diagnosis not present

## 2017-02-12 DIAGNOSIS — C641 Malignant neoplasm of right kidney, except renal pelvis: Secondary | ICD-10-CM | POA: Diagnosis not present

## 2017-02-12 DIAGNOSIS — Z87891 Personal history of nicotine dependence: Secondary | ICD-10-CM

## 2017-02-12 DIAGNOSIS — Z7982 Long term (current) use of aspirin: Secondary | ICD-10-CM | POA: Diagnosis not present

## 2017-02-12 DIAGNOSIS — T83098A Other mechanical complication of other indwelling urethral catheter, initial encounter: Secondary | ICD-10-CM | POA: Diagnosis not present

## 2017-02-12 DIAGNOSIS — Z7984 Long term (current) use of oral hypoglycemic drugs: Secondary | ICD-10-CM | POA: Diagnosis not present

## 2017-02-12 DIAGNOSIS — I2510 Atherosclerotic heart disease of native coronary artery without angina pectoris: Secondary | ICD-10-CM | POA: Diagnosis present

## 2017-02-12 DIAGNOSIS — Y828 Other medical devices associated with adverse incidents: Secondary | ICD-10-CM | POA: Diagnosis not present

## 2017-02-12 DIAGNOSIS — Z833 Family history of diabetes mellitus: Secondary | ICD-10-CM

## 2017-02-12 DIAGNOSIS — Z96653 Presence of artificial knee joint, bilateral: Secondary | ICD-10-CM | POA: Diagnosis not present

## 2017-02-12 DIAGNOSIS — Z8551 Personal history of malignant neoplasm of bladder: Secondary | ICD-10-CM | POA: Diagnosis not present

## 2017-02-12 DIAGNOSIS — C651 Malignant neoplasm of right renal pelvis: Secondary | ICD-10-CM | POA: Diagnosis not present

## 2017-02-12 DIAGNOSIS — T83091A Other mechanical complication of indwelling urethral catheter, initial encounter: Secondary | ICD-10-CM | POA: Diagnosis not present

## 2017-02-12 DIAGNOSIS — K219 Gastro-esophageal reflux disease without esophagitis: Secondary | ICD-10-CM | POA: Diagnosis not present

## 2017-02-12 HISTORY — PX: ROBOT ASSITED LAPAROSCOPIC NEPHROURETERECTOMY: SHX6077

## 2017-02-12 LAB — GLUCOSE, CAPILLARY
Glucose-Capillary: 151 mg/dL — ABNORMAL HIGH (ref 65–99)
Glucose-Capillary: 201 mg/dL — ABNORMAL HIGH (ref 65–99)
Glucose-Capillary: 162 mg/dL — ABNORMAL HIGH (ref 65–99)
Glucose-Capillary: 211 mg/dL — ABNORMAL HIGH (ref 65–99)

## 2017-02-12 LAB — TYPE AND SCREEN
ABO/RH(D): A POS
ANTIBODY SCREEN: NEGATIVE

## 2017-02-12 LAB — CBC
HEMATOCRIT: 33.1 % — AB (ref 39.0–52.0)
Hemoglobin: 10.8 g/dL — ABNORMAL LOW (ref 13.0–17.0)
MCH: 28.1 pg (ref 26.0–34.0)
MCHC: 32.6 g/dL (ref 30.0–36.0)
MCV: 86.2 fL (ref 78.0–100.0)
PLATELETS: 197 10*3/uL (ref 150–400)
RBC: 3.84 MIL/uL — AB (ref 4.22–5.81)
RDW: 14 % (ref 11.5–15.5)
WBC: 7.7 10*3/uL (ref 4.0–10.5)

## 2017-02-12 SURGERY — NEPHROURETERECTOMY, ROBOT-ASSISTED, LAPAROSCOPIC
Anesthesia: General | Laterality: Right

## 2017-02-12 MED ORDER — ROCURONIUM BROMIDE 50 MG/5ML IV SOSY
PREFILLED_SYRINGE | INTRAVENOUS | Status: AC
  Filled 2017-02-12: qty 5

## 2017-02-12 MED ORDER — METOPROLOL SUCCINATE ER 25 MG PO TB24
25.0000 mg | ORAL_TABLET | Freq: Every day | ORAL | Status: DC
  Administered 2017-02-12 – 2017-02-15 (×4): 25 mg via ORAL
  Filled 2017-02-12 (×4): qty 1

## 2017-02-12 MED ORDER — ONDANSETRON HCL 4 MG/2ML IJ SOLN
4.0000 mg | INTRAMUSCULAR | Status: DC | PRN
  Administered 2017-02-15: 4 mg via INTRAVENOUS
  Filled 2017-02-12: qty 2

## 2017-02-12 MED ORDER — MORPHINE SULFATE (PF) 4 MG/ML IV SOLN
1.0000 mg | INTRAVENOUS | Status: DC | PRN
  Administered 2017-02-12 (×4): 2 mg via INTRAVENOUS

## 2017-02-12 MED ORDER — ROCURONIUM BROMIDE 10 MG/ML (PF) SYRINGE
PREFILLED_SYRINGE | INTRAVENOUS | Status: DC | PRN
  Administered 2017-02-12: 100 mg via INTRAVENOUS
  Administered 2017-02-12 (×2): 20 mg via INTRAVENOUS

## 2017-02-12 MED ORDER — FENTANYL CITRATE (PF) 250 MCG/5ML IJ SOLN
INTRAMUSCULAR | Status: AC
  Filled 2017-02-12: qty 5

## 2017-02-12 MED ORDER — ISOSORBIDE MONONITRATE ER 60 MG PO TB24
120.0000 mg | ORAL_TABLET | Freq: Every day | ORAL | Status: DC
  Administered 2017-02-13 – 2017-02-16 (×4): 120 mg via ORAL
  Filled 2017-02-12 (×4): qty 2

## 2017-02-12 MED ORDER — CEFAZOLIN SODIUM-DEXTROSE 2-4 GM/100ML-% IV SOLN
INTRAVENOUS | Status: AC
Start: 2017-02-12 — End: 2017-02-12
  Filled 2017-02-12: qty 100

## 2017-02-12 MED ORDER — BUPIVACAINE-EPINEPHRINE 0.5% -1:200000 IJ SOLN
INTRAMUSCULAR | Status: DC | PRN
  Administered 2017-02-12: 30 mL

## 2017-02-12 MED ORDER — LIDOCAINE 2% (20 MG/ML) 5 ML SYRINGE
INTRAMUSCULAR | Status: AC
  Filled 2017-02-12: qty 5

## 2017-02-12 MED ORDER — MIDAZOLAM HCL 5 MG/5ML IJ SOLN
INTRAMUSCULAR | Status: DC | PRN
  Administered 2017-02-12: 2 mg via INTRAVENOUS

## 2017-02-12 MED ORDER — SODIUM CHLORIDE 0.9 % IJ SOLN
INTRAMUSCULAR | Status: DC | PRN
  Administered 2017-02-12: 60 mL

## 2017-02-12 MED ORDER — CANAGLIFLOZIN 300 MG PO TABS: 150.0000 mg | ORAL_TABLET | Freq: Every day | ORAL | Status: DC

## 2017-02-12 MED ORDER — OXYCODONE HCL 5 MG PO TABS
5.0000 mg | ORAL_TABLET | ORAL | Status: DC | PRN
  Administered 2017-02-12: 5 mg via ORAL
  Filled 2017-02-12: qty 1

## 2017-02-12 MED ORDER — NITROGLYCERIN 0.4 MG SL SUBL: 0.4000 mg | SUBLINGUAL_TABLET | SUBLINGUAL | Status: DC | PRN

## 2017-02-12 MED ORDER — SODIUM CHLORIDE 0.9 % IJ SOLN
INTRAMUSCULAR | Status: AC
  Filled 2017-02-12: qty 50

## 2017-02-12 MED ORDER — PROMETHAZINE HCL 25 MG/ML IJ SOLN: 6.2500 mg | INTRAMUSCULAR | Status: DC | PRN

## 2017-02-12 MED ORDER — DIPHENHYDRAMINE HCL 12.5 MG/5ML PO ELIX
12.5000 mg | ORAL_SOLUTION | Freq: Four times a day (QID) | ORAL | Status: DC | PRN
  Administered 2017-02-16: 25 mg via ORAL
  Filled 2017-02-12: qty 10

## 2017-02-12 MED ORDER — DIPHENHYDRAMINE HCL 50 MG/ML IJ SOLN: 12.5000 mg | Freq: Four times a day (QID) | INTRAMUSCULAR | Status: DC | PRN

## 2017-02-12 MED ORDER — DEXAMETHASONE SODIUM PHOSPHATE 10 MG/ML IJ SOLN
INTRAMUSCULAR | Status: AC
  Filled 2017-02-12: qty 1

## 2017-02-12 MED ORDER — STERILE WATER FOR IRRIGATION IR SOLN
Status: DC | PRN
  Administered 2017-02-12: 1000 mL

## 2017-02-12 MED ORDER — LACTATED RINGERS IV SOLN
INTRAVENOUS | Status: DC | PRN
  Administered 2017-02-12 (×3): via INTRAVENOUS

## 2017-02-12 MED ORDER — FENTANYL CITRATE (PF) 100 MCG/2ML IJ SOLN
INTRAMUSCULAR | Status: AC
  Administered 2017-02-12: 50 ug
  Filled 2017-02-12: qty 2

## 2017-02-12 MED ORDER — PROPOFOL 10 MG/ML IV BOLUS
INTRAVENOUS | Status: AC
  Filled 2017-02-12: qty 20

## 2017-02-12 MED ORDER — TRAMADOL HCL 50 MG PO TABS
50.0000 mg | ORAL_TABLET | Freq: Four times a day (QID) | ORAL | 0 refills | Status: DC | PRN
Start: 2017-02-12 — End: 2017-02-13

## 2017-02-12 MED ORDER — MAGNESIUM CITRATE PO SOLN
1.0000 | Freq: Once | ORAL | Status: DC
  Filled 2017-02-12: qty 296

## 2017-02-12 MED ORDER — LACTATED RINGERS IR SOLN
Status: DC | PRN
  Administered 2017-02-12: 1000 mL

## 2017-02-12 MED ORDER — PANTOPRAZOLE SODIUM 40 MG PO TBEC
40.0000 mg | DELAYED_RELEASE_TABLET | Freq: Every day | ORAL | Status: DC
  Administered 2017-02-13 – 2017-02-16 (×4): 40 mg via ORAL
  Filled 2017-02-12 (×4): qty 1

## 2017-02-12 MED ORDER — DEXAMETHASONE SODIUM PHOSPHATE 10 MG/ML IJ SOLN
INTRAMUSCULAR | Status: DC | PRN
  Administered 2017-02-12: 10 mg via INTRAVENOUS

## 2017-02-12 MED ORDER — SCOPOLAMINE 1 MG/3DAYS TD PT72
1.0000 | MEDICATED_PATCH | TRANSDERMAL | Status: DC
  Administered 2017-02-12: 1.5 mg via TRANSDERMAL
  Filled 2017-02-12: qty 1

## 2017-02-12 MED ORDER — HYDROMORPHONE HCL-NACL 0.5-0.9 MG/ML-% IV SOSY: 0.2500 mg | PREFILLED_SYRINGE | INTRAVENOUS | Status: DC | PRN

## 2017-02-12 MED ORDER — TAMSULOSIN HCL 0.4 MG PO CAPS
0.4000 mg | ORAL_CAPSULE | Freq: Every day | ORAL | Status: DC
  Administered 2017-02-13 – 2017-02-16 (×4): 0.4 mg via ORAL
  Filled 2017-02-12 (×4): qty 1

## 2017-02-12 MED ORDER — FENTANYL CITRATE (PF) 100 MCG/2ML IJ SOLN
INTRAMUSCULAR | Status: AC
  Filled 2017-02-12: qty 2

## 2017-02-12 MED ORDER — ROPINIROLE HCL 1 MG PO TABS
2.0000 mg | ORAL_TABLET | Freq: Every day | ORAL | Status: DC
  Administered 2017-02-12 – 2017-02-15 (×4): 2 mg via ORAL
  Filled 2017-02-12 (×5): qty 2

## 2017-02-12 MED ORDER — AMLODIPINE BESYLATE 10 MG PO TABS
10.0000 mg | ORAL_TABLET | Freq: Every day | ORAL | Status: DC
  Administered 2017-02-13 – 2017-02-15 (×3): 10 mg via ORAL
  Filled 2017-02-12 (×3): qty 1

## 2017-02-12 MED ORDER — FENTANYL CITRATE (PF) 100 MCG/2ML IJ SOLN
50.0000 ug | INTRAMUSCULAR | Status: AC | PRN
  Administered 2017-02-12 (×2): 50 ug via INTRAVENOUS

## 2017-02-12 MED ORDER — SODIUM CHLORIDE 0.45 % IV SOLN
INTRAVENOUS | Status: DC
  Administered 2017-02-12 – 2017-02-13 (×2): via INTRAVENOUS

## 2017-02-12 MED ORDER — INSULIN ASPART 100 UNIT/ML ~~LOC~~ SOLN: 5.0000 [IU] | Freq: Once | SUBCUTANEOUS | Status: DC

## 2017-02-12 MED ORDER — SUGAMMADEX SODIUM 500 MG/5ML IV SOLN
INTRAVENOUS | Status: AC
  Filled 2017-02-12: qty 5

## 2017-02-12 MED ORDER — INSULIN ASPART 100 UNIT/ML ~~LOC~~ SOLN
0.0000 [IU] | Freq: Three times a day (TID) | SUBCUTANEOUS | Status: DC
  Administered 2017-02-12: 5 [IU] via SUBCUTANEOUS
  Administered 2017-02-12: 3 [IU] via SUBCUTANEOUS
  Administered 2017-02-14: 2 [IU] via SUBCUTANEOUS
  Administered 2017-02-14 – 2017-02-15 (×2): 3 [IU] via SUBCUTANEOUS
  Administered 2017-02-15: 2 [IU] via SUBCUTANEOUS
  Administered 2017-02-15: 5 [IU] via SUBCUTANEOUS
  Administered 2017-02-16: 8 [IU] via SUBCUTANEOUS
  Administered 2017-02-16: 2 [IU] via SUBCUTANEOUS

## 2017-02-12 MED ORDER — ONDANSETRON HCL 4 MG/2ML IJ SOLN
INTRAMUSCULAR | Status: AC
  Filled 2017-02-12: qty 2

## 2017-02-12 MED ORDER — ACETAMINOPHEN 10 MG/ML IV SOLN
1000.0000 mg | Freq: Four times a day (QID) | INTRAVENOUS | Status: AC
  Administered 2017-02-12 – 2017-02-13 (×4): 1000 mg via INTRAVENOUS
  Filled 2017-02-12 (×3): qty 100

## 2017-02-12 MED ORDER — CEFAZOLIN SODIUM-DEXTROSE 2-4 GM/100ML-% IV SOLN
2.0000 g | INTRAVENOUS | Status: AC
  Administered 2017-02-12: 2 g via INTRAVENOUS

## 2017-02-12 MED ORDER — ROSUVASTATIN CALCIUM 10 MG PO TABS
10.0000 mg | ORAL_TABLET | ORAL | Status: DC
  Administered 2017-02-13 – 2017-02-15 (×3): 10 mg via ORAL
  Filled 2017-02-12 (×3): qty 1

## 2017-02-12 MED ORDER — HEMOSTATIC AGENTS (NO CHARGE) OPTIME
TOPICAL | Status: DC | PRN
  Administered 2017-02-12: 1

## 2017-02-12 MED ORDER — BUPIVACAINE-EPINEPHRINE (PF) 0.5% -1:200000 IJ SOLN
INTRAMUSCULAR | Status: AC
  Filled 2017-02-12: qty 30

## 2017-02-12 MED ORDER — GLIMEPIRIDE 2 MG PO TABS
2.0000 mg | ORAL_TABLET | Freq: Every day | ORAL | Status: DC
  Administered 2017-02-12 – 2017-02-15 (×4): 2 mg via ORAL
  Filled 2017-02-12 (×5): qty 1

## 2017-02-12 MED ORDER — SODIUM CHLORIDE 0.9 % IJ SOLN
INTRAMUSCULAR | Status: AC
  Filled 2017-02-12: qty 10

## 2017-02-12 MED ORDER — INSULIN ASPART 100 UNIT/ML ~~LOC~~ SOLN
SUBCUTANEOUS | Status: AC
  Filled 2017-02-12: qty 1

## 2017-02-12 MED ORDER — MORPHINE SULFATE (PF) 4 MG/ML IV SOLN
2.0000 mg | INTRAVENOUS | Status: DC | PRN
  Administered 2017-02-12 – 2017-02-13 (×2): 4 mg via INTRAVENOUS
  Administered 2017-02-13: 2 mg via INTRAVENOUS
  Administered 2017-02-13: 4 mg via INTRAVENOUS
  Administered 2017-02-13: 2 mg via INTRAVENOUS
  Administered 2017-02-14 – 2017-02-16 (×11): 4 mg via INTRAVENOUS
  Filled 2017-02-12 (×15): qty 1

## 2017-02-12 MED ORDER — LIDOCAINE 2% (20 MG/ML) 5 ML SYRINGE
INTRAMUSCULAR | Status: DC | PRN
  Administered 2017-02-12: 100 mg via INTRAVENOUS

## 2017-02-12 MED ORDER — SUGAMMADEX SODIUM 200 MG/2ML IV SOLN
INTRAVENOUS | Status: DC | PRN
  Administered 2017-02-12: 400 mg via INTRAVENOUS

## 2017-02-12 MED ORDER — PROPOFOL 10 MG/ML IV BOLUS
INTRAVENOUS | Status: DC | PRN
  Administered 2017-02-12: 200 mg via INTRAVENOUS

## 2017-02-12 MED ORDER — KETOROLAC TROMETHAMINE 15 MG/ML IJ SOLN
15.0000 mg | Freq: Once | INTRAMUSCULAR | Status: AC
  Administered 2017-02-12: 15 mg via INTRAVENOUS
  Filled 2017-02-12: qty 1

## 2017-02-12 MED ORDER — HYDROMORPHONE HCL-NACL 0.5-0.9 MG/ML-% IV SOSY: 0.5000 mg | PREFILLED_SYRINGE | INTRAVENOUS | Status: DC | PRN

## 2017-02-12 MED ORDER — ONDANSETRON HCL 4 MG/2ML IJ SOLN
INTRAMUSCULAR | Status: DC | PRN
  Administered 2017-02-12: 4 mg via INTRAVENOUS

## 2017-02-12 MED ORDER — CANAGLIFLOZIN 100 MG PO TABS
100.0000 mg | ORAL_TABLET | Freq: Every day | ORAL | Status: DC
  Administered 2017-02-13 – 2017-02-14 (×2): 100 mg via ORAL
  Filled 2017-02-12 (×2): qty 1

## 2017-02-12 MED ORDER — ACETAMINOPHEN 10 MG/ML IV SOLN
INTRAVENOUS | Status: AC
  Filled 2017-02-12: qty 100

## 2017-02-12 MED ORDER — FENTANYL CITRATE (PF) 250 MCG/5ML IJ SOLN
INTRAMUSCULAR | Status: DC | PRN
  Administered 2017-02-12 (×5): 50 ug via INTRAVENOUS
  Administered 2017-02-12: 150 ug via INTRAVENOUS
  Administered 2017-02-12: 50 ug via INTRAVENOUS

## 2017-02-12 MED ORDER — CANAGLIFLOZIN 100 MG PO TABS: 150.0000 mg | ORAL_TABLET | Freq: Every day | ORAL | Status: DC

## 2017-02-12 MED ORDER — ROCURONIUM BROMIDE 50 MG/5ML IV SOSY
PREFILLED_SYRINGE | INTRAVENOUS | Status: AC
  Filled 2017-02-12: qty 10

## 2017-02-12 MED ORDER — MIDAZOLAM HCL 2 MG/2ML IJ SOLN
INTRAMUSCULAR | Status: AC
  Filled 2017-02-12: qty 2

## 2017-02-12 MED ORDER — MORPHINE SULFATE (PF) 4 MG/ML IV SOLN
1.0000 mg | INTRAVENOUS | Status: DC | PRN
  Filled 2017-02-12: qty 1

## 2017-02-12 MED ORDER — FENTANYL CITRATE (PF) 100 MCG/2ML IJ SOLN: 50.0000 ug | INTRAMUSCULAR | Status: DC | PRN

## 2017-02-12 MED ORDER — MORPHINE SULFATE (PF) 10 MG/ML IV SOLN
INTRAVENOUS | Status: AC
  Administered 2017-02-12: 2 mg
  Filled 2017-02-12: qty 1

## 2017-02-12 MED ORDER — BUPIVACAINE LIPOSOME 1.3 % IJ SUSP
20.0000 mL | Freq: Once | INTRAMUSCULAR | Status: AC
  Administered 2017-02-12: 20 mL
  Filled 2017-02-12: qty 20

## 2017-02-12 SURGICAL SUPPLY — 72 items
BAG LAPAROSCOPIC 12 15 PORT 16 (BASKET) ×1 IMPLANT
BAG RETRIEVAL 12/15 (BASKET) ×2
BAG URINE DRAINAGE (UROLOGICAL SUPPLIES) ×2 IMPLANT
CATH FOLEY 3WAY  5CC 18FR (CATHETERS)
CATH FOLEY 3WAY 5CC 18FR (CATHETERS) IMPLANT
CHLORAPREP W/TINT 26ML (MISCELLANEOUS) ×2 IMPLANT
CLIP VESOLOCK LG 6/CT PURPLE (CLIP) ×2 IMPLANT
CLIP VESOLOCK MED LG 6/CT (CLIP) ×2 IMPLANT
CLIP VESOLOCK XL 6/CT (CLIP) IMPLANT
COVER SURGICAL LIGHT HANDLE (MISCELLANEOUS) ×2 IMPLANT
COVER TIP SHEARS 8 DVNC (MISCELLANEOUS) ×1 IMPLANT
COVER TIP SHEARS 8MM DA VINCI (MISCELLANEOUS) ×1
CUTTER FLEX LINEAR 45M (STAPLE) ×2 IMPLANT
DECANTER SPIKE VIAL GLASS SM (MISCELLANEOUS) ×2 IMPLANT
DERMABOND ADVANCED (GAUZE/BANDAGES/DRESSINGS) ×2
DERMABOND ADVANCED .7 DNX12 (GAUZE/BANDAGES/DRESSINGS) ×2 IMPLANT
DRAIN CHANNEL 15F RND FF 3/16 (WOUND CARE) ×2 IMPLANT
DRAPE ARM DVNC X/XI (DISPOSABLE) ×4 IMPLANT
DRAPE COLUMN DVNC XI (DISPOSABLE) ×1 IMPLANT
DRAPE DA VINCI XI ARM (DISPOSABLE) ×4
DRAPE DA VINCI XI COLUMN (DISPOSABLE) ×1
DRAPE INCISE IOBAN 66X45 STRL (DRAPES) ×2 IMPLANT
DRAPE SHEET LG 3/4 BI-LAMINATE (DRAPES) ×2 IMPLANT
DRSG TEGADERM 4X4.75 (GAUZE/BANDAGES/DRESSINGS) ×2 IMPLANT
ELECT PENCIL ROCKER SW 15FT (MISCELLANEOUS) ×2 IMPLANT
ELECT REM PT RETURN 15FT ADLT (MISCELLANEOUS) ×2 IMPLANT
EVACUATOR SILICONE 100CC (DRAIN) ×2 IMPLANT
GAUZE SPONGE 2X2 8PLY STRL LF (GAUZE/BANDAGES/DRESSINGS) IMPLANT
GLOVE BIO SURGEON STRL SZ 6.5 (GLOVE) ×2 IMPLANT
GLOVE BIOGEL M STRL SZ7.5 (GLOVE) ×4 IMPLANT
GLOVE ECLIPSE 8.0 STRL XLNG CF (GLOVE) ×2 IMPLANT
GOWN SPEC L4 XLG W/TWL (GOWN DISPOSABLE) ×2 IMPLANT
GOWN STRL REUS W/TWL LRG LVL3 (GOWN DISPOSABLE) ×14 IMPLANT
HEMOSTAT SURGICEL 4X8 (HEMOSTASIS) ×2 IMPLANT
HOLDER FOLEY CATH W/STRAP (MISCELLANEOUS) ×2 IMPLANT
IRRIG SUCT STRYKERFLOW 2 WTIP (MISCELLANEOUS) ×2
IRRIGATION SUCT STRKRFLW 2 WTP (MISCELLANEOUS) ×1 IMPLANT
KIT BASIN OR (CUSTOM PROCEDURE TRAY) ×2 IMPLANT
LOOP VESSEL MAXI BLUE (MISCELLANEOUS) ×2 IMPLANT
NEEDLE INSUFFLATION 14GA 120MM (NEEDLE) ×2 IMPLANT
NEEDLE SPNL 18GX3.5 QUINCKE PK (NEEDLE) ×2 IMPLANT
PLUG CATH AND CAP STER (CATHETERS) IMPLANT
PORT ACCESS TROCAR AIRSEAL 12 (TROCAR) ×1 IMPLANT
PORT ACCESS TROCAR AIRSEAL 5M (TROCAR) ×1
POSITIONER SURGICAL ARM (MISCELLANEOUS) ×4 IMPLANT
RELOAD 45 VASCULAR/THIN (ENDOMECHANICALS) ×2 IMPLANT
SEAL CANN UNIV 5-8 DVNC XI (MISCELLANEOUS) ×4 IMPLANT
SEAL XI 5MM-8MM UNIVERSAL (MISCELLANEOUS) ×4
SET TRI-LUMEN FLTR TB AIRSEAL (TUBING) ×2 IMPLANT
SOLUTION ELECTROLUBE (MISCELLANEOUS) ×2 IMPLANT
SPONGE GAUZE 2X2 STER 10/PKG (GAUZE/BANDAGES/DRESSINGS)
SUT ETHILON 3 0 PS 1 (SUTURE) ×2 IMPLANT
SUT MNCRL AB 4-0 PS2 18 (SUTURE) ×4 IMPLANT
SUT PDS AB 1 CTX 36 (SUTURE) ×4 IMPLANT
SUT V-LOC BARB 180 2/0GR6 GS22 (SUTURE) ×2
SUT VIC AB 0 CT1 27 (SUTURE)
SUT VIC AB 0 CT1 27XBRD ANTBC (SUTURE) IMPLANT
SUT VIC AB 0 UR5 27 (SUTURE) ×2 IMPLANT
SUT VICRYL 0 UR6 27IN ABS (SUTURE) ×4 IMPLANT
SUT VLOC BARB 180 ABS3/0GR12 (SUTURE) ×2
SUTURE V-LC BRB 180 2/0GR6GS22 (SUTURE) ×1 IMPLANT
SUTURE VLOC BRB 180 ABS3/0GR12 (SUTURE) ×1 IMPLANT
SYR BULB IRRIGATION 50ML (SYRINGE) ×2 IMPLANT
TAPE STRIPS DRAPE STRL (GAUZE/BANDAGES/DRESSINGS) IMPLANT
TOWEL OR 17X26 10 PK STRL BLUE (TOWEL DISPOSABLE) ×2 IMPLANT
TOWEL OR NON WOVEN STRL DISP B (DISPOSABLE) ×2 IMPLANT
TRAY FOLEY W/METER SILVER 16FR (SET/KITS/TRAYS/PACK) ×2 IMPLANT
TRAY LAPAROSCOPIC (CUSTOM PROCEDURE TRAY) ×2 IMPLANT
TROCAR BLADELESS OPT 5 100 (ENDOMECHANICALS) ×2 IMPLANT
TROCAR XCEL 12X100 BLDLESS (ENDOMECHANICALS) IMPLANT
WATER STERILE IRR 1000ML POUR (IV SOLUTION) ×2 IMPLANT
WATER STERILE IRR 1000ML UROMA (IV SOLUTION) IMPLANT

## 2017-02-12 NOTE — Discharge Instructions (Signed)
Foley Catheter Care, Adult A soft, flexible tube (Foley catheter) has been placed in your bladder. This may be done to temporarily help with urine drainage after an operation or to relieve blockage from an enlarged prostate gland. HOME CARE INSTRUCTIONS  If you are going home with a Foley catheter in place, follow these instructions: Taking Care of the Catheter:  Keep the area where the catheter leaves your body clean.  Attach the catheter to the leg so there is no tension on the catheter.  Keep the drainage bag below the level of the bladder, but keep it OFF the floor.  Do not take long soaking baths.   Wash your hands before touching ANYTHING related to the catheter or bag.  Using mild soap and warm water on a washcloth:  Clean the area closest to the catheter insertion site using a circular motion around the catheter.  Clean the catheter itself by wiping AWAY from the insertion site for several inches down the tube.  NEVER wipe upward as this could sweep bacteria up into the urethra (tube in your body that normally drains the bladder) and cause infection. Taking Care of the Drainage Bags:  Two drainage bags will be taken home: a large overnight drainage bag, and a smaller leg bag which fits underneath clothing.  It is okay to wear the overnight bag at any time, but NEVER wear the smaller leg bag at night.  Keep the drainage bag well below the level of your bladder. This prevents backflow of urine into the bladder and allows the urine to drain freely.  Anchor the tubing to your leg to prevent pulling or tension on the catheter. Use tape or a leg strap provided by the hospital.  Empty the drainage bag when it is  to  full. Wash your hands before and after touching the bag.  Periodically check the tubing for kinks to make sure there is no pressure on the tubing which could restrict the flow of urine. Changing the Drainage Bags:  Cleanse both ends of the clean bag with alcohol  before changing.  Pinch off the rubber catheter to avoid urine spillage during the disconnection.  Disconnect the dirty bag and connect the clean one.  Empty the dirty bag carefully to avoid a urine spill.  Attach the new bag to the leg with tape or a leg strap. Cleaning the Drainage Bags:  Whenever a drainage bag is disconnected, it must be cleaned quickly so it is ready for the next use.  Wash the bag in warm, soapy water.  Rinse the bag thoroughly with warm water.  Soak the bag for 30 minutes in a solution of white vinegar and water (1 cup vinegar to 1 quart warm water).  Rinse with warm water. SEEK MEDICAL CARE IF:   Some pain develops in the kidney (lower back) area.  The urine is cloudy or smells bad.  There is some blood in the urine.  The catheter becomes clogged and/or there is no urine drainage. SEEK IMMEDIATE MEDICAL CARE IF:   You have moderate or severe pain in the kidney region.  You start to throw up (vomit).  Blood fills the tube.  Worsening belly (abdominal) pain develops.  You have a fever. MAKE SURE YOU:   Understand these instructions.  Will watch your condition.  Will get help right away if you are not doing well or get worse.  Call [facility] if you have any questions or concerns: [phone]  Activity:  You are encouraged to ambulate frequently (about every hour during waking hours) to help prevent blood clots from forming in your legs or lungs.  However, you should not engage in any heavy lifting (> 10-15 lbs), strenuous activity, or straining.   Diet: You should advance your diet as instructed by your physician.  It will be normal to have some bloating, nausea, and abdominal discomfort intermittently.   Prescriptions:  You will be provided a prescription for pain medication to take as needed.  If your pain is not severe enough to require the prescription pain medication, you may take extra strength Tylenol instead which  will have less side effects.  You should also take a prescribed stool softener to avoid straining with bowel movements as the prescription pain medication may constipate you.   Incisions: You may remove your dressing bandages 48 hours after surgery if not removed in the hospital.  You will either have some small staples or special tissue glue at each of the incision sites. Once the bandages are removed (if present), the incisions may stay open to air.  You may start showering (but not soaking or bathing in water) the 2nd day after surgery and the incisions simply need to be patted dry after the shower.  No additional care is needed.   What to call us about: You should call the office [phone]) if you develop fever > 101 or develop persistent vomiting. Activity:  You are encouraged to ambulate frequently (about every hour during waking hours) to help prevent blood clots from forming in your legs or lungs.  However, you should not engage in any heavy lifting (> 10-15 lbs), strenuous activity, or straining.  You may resume aspirin, advil, aleve, vitamins, and supplements 7 days after surgery.

## 2017-02-12 NOTE — Anesthesia Procedure Notes (Signed)
Procedure Name: Intubation Date/Time: 02/12/2017 7:49 AM Performed by: Talbot Grumbling Pre-anesthesia Checklist: Patient identified, Emergency Drugs available, Suction available and Patient being monitored Patient Re-evaluated:Patient Re-evaluated prior to induction Oxygen Delivery Method: Circle system utilized Preoxygenation: Pre-oxygenation with 100% oxygen Induction Type: IV induction Ventilation: Mask ventilation without difficulty and Oral airway inserted - appropriate to patient size Laryngoscope Size: Mac and 3 Grade View: Grade I Tube size: 8.0 mm Number of attempts: 1 Airway Equipment and Method: Stylet Placement Confirmation: ETT inserted through vocal cords under direct vision,  positive ETCO2 and breath sounds checked- equal and bilateral Secured at: 23 cm Tube secured with: Tape Dental Injury: Teeth and Oropharynx as per pre-operative assessment

## 2017-02-12 NOTE — Transfer of Care (Signed)
Immediate Anesthesia Transfer of Care Note  Patient: Isaac [name]  Procedure(s) Performed: Procedure(s): XI ROBOT ASSITED LAPAROSCOPIC NEPHROURETERECTOMY (Right)  Patient Location: PACU  Anesthesia Type:General  Level of Consciousness:  sedated, patient cooperative and responds to stimulation  Airway & Oxygen Therapy:Patient Spontanous Breathing and Patient connected to face mask oxgen  Post-op Assessment:  Report given to PACU RN and Post -op Vital signs reviewed and stable  Post vital signs:  Reviewed and stable  Last Vitals:  Vitals:   02/12/17 0525  BP: 126/86  Pulse: 84  Resp: 18  Temp: 37.3 C  SpO2: 75%    Complications: No apparent anesthesia complications

## 2017-02-12 NOTE — Anesthesia Postprocedure Evaluation (Addendum)
Anesthesia Post Note  Patient: Isaac [name]  Procedure(s) Performed: XI ROBOT ASSITED LAPAROSCOPIC NEPHROURETERECTOMY (Right )     Patient location during evaluation: PACU Anesthesia Type: General Level of consciousness: sedated Pain management: pain level controlled Vital Signs Assessment: post-procedure vital signs reviewed and stable Respiratory status: spontaneous breathing and respiratory function stable Cardiovascular status: stable Postop Assessment: no apparent nausea or vomiting Anesthetic complications: no    Last Vitals:  Vitals:   02/12/17 1330 02/12/17 1345  BP: 97/68 101/69  Pulse: 71 72  Resp: 15 16  Temp:  (!) 36.3 C  SpO2: 92% 96%             Kreig Parson DANIEL

## 2017-02-12 NOTE — Care Management Note (Signed)
Case Management Note  Patient Details  Name: Isaac [name] MRN: [id number] Date of Birth: Nov 20, 1955  Subjective/Objective:  61 y/o m admitted w/renal mass. From home.                  Action/Plan:d/c plan home.   Expected Discharge Date:                  Expected Discharge Plan:  Home/Self Care  In-House Referral:     Discharge planning Services  CM Consult  Post Acute Care Choice:    Choice offered to:     DME Arranged:    DME Agency:     HH Arranged:    HH Agency:     Status of Service:  In process, will continue to follow  If discussed at Long Length of Stay Meetings, dates discussed:    Additional Comments:  Dessa Phi, RN 02/12/2017, 3:44 PM

## 2017-02-12 NOTE — H&P (Signed)
the patient has a significant extensive history of transitional cell carcinoma. He has had numerous bladder recurrences and also underwent a distal right ureterectomy with reimplantation by myself in 2010. His last bladder recurrence was in November 2013. He presented in August 2017 with gross hematuria with a negative culture. CT scan did not show an explanation for his hematuria. He did have urine cytology as well on his October 2017 follow-up, which was negative.   CT results from 12/2015: No CT findings to account for the patient's hematuria. Mild hepatic steatosis. Colonic diverticulosis, without evidence of diverticulitis. Small to moderate fat containing left inguinal hernia.   12/2016: Repeat cystoscopy demonstrated no evidence of recurrence. However, given the patient's ongoing gross hematuria CAT scan was repeated. The CAT scan this time demonstrated a large filling defect in the right collecting system with some enhancement of that region concerning for urothelial carcinoma.   9/18: the patient underwent cystoscopy and right ureteroscopy with biopsy of the area in the right kidney. Ureteroscopy demonstrated at least 3 calyces that were filled with papillary tumors consistent with transitional cell carcinoma. The biopsy demonstrated a low-grade TCC.   Interval:  Patient returns today for discussion of his upcoming surgery. The patient was able to remove the stent at home without any significant issues. He has had some ongoing hematuria and is passing tissue. He continues to have some intermittent right flank pain, exacerbated when he voids.     Past surgical history: The patient had a open right distal ureterectomy and reimplant in 2010. He then had a ventral hernia around the umbilicus which has subsequently been repaired.   Past medical history: The patient has non-insulin-dependent diabetes as well as coronary artery disease. He recently had an echocardiogram which demonstrated an EF of  60% with no wall motion abnormality.     ALLERGIES: No Allergies    MEDICATIONS: Aspirin Low Dose TABS 1 Oral  Crestor 10 mg tablet 0 Oral  Diazepam 10 mg tablet 2 tablet PO before procedure  Glimepiride 2 mg tablet 0 Oral  GlyBURIDE 5 MG Oral Tablet 1 Oral Daily  Invokana 100 mg tablet Oral  Isosorbide Mononitrate Er 120 mg tablet, extended release 24 hr 1 Oral Daily  Lantus 100 unit/ml vial 0 Subcutaneous  Metformin Hcl 1,000 mg tablet 0 Oral  Norvasc 5 mg tablet 2 Oral  Pantoprazole Sodium 40 mg tablet, delayed release 0 Oral  Ropinirole Hcl 1 mg tablet 0 Oral  Sildenafil 20 mg tablet 1-2 tablet PO Daily  Toprol Xl 25 mg tablet, extended release 24 hr 1 Oral Daily     GU PSH: Bladder Instill AntiCA Agent - 2013, 2012 Cysto Bladder Ureth Biopsy - 2010, 2009, 2008 Cysto Uretero Biopsy Fulgura, Right - 01/21/2017, 2010, 2009 Cystoscopy - 12/17/2016, 08/18/2016, 02/11/2016 Cystoscopy Insert Stent, Right - 01/21/2017, 2010, 2009 Cystoscopy TURBT <2 cm - 2013, 2012, 2012 Cystoscopy TURBT 2-5 cm - 2013 Cystoscopy Ureteroscopy - 2012, 2012, 2010, 2010 Locm 300-399Mg /Ml Iodine,1Ml - 12/30/2016, 12/27/2015 Reimplant Hitch or Flap - 2010      PSH Notes: Knee Replacement, Knee Arthroscopy, Bladder Injection Of Cancer Treatment, Cystoscopy With Fulguration Medium Lesion (2-5cm), Cystoscopy With Fulguration Small Lesion (5-23mm), Bladder Injection Of Cancer Treatment, Cystoscopy With Ureteroscopy Right, Cystoscopy With Fulguration Small Lesion (5-74mm), Cystoscopy With Fulguration Small Lesion (5-3mm), Cystoscopy With Ureteroscopy Right, Cystoscopy With Ureteroscopy Right, Ureteroneocystostomy With Bladder Flap, Cystoscopy With Insertion Of Ureteral Stent Right, Cystoscopy With Ureteroscopy For Biopsy Right, Cystoscopy With Ureteroscopy Right, Cystoscopy With Biopsy,  Sinus Surgery, Cystoscopy With Ureteroscopy With Fulguration Of Lesion, Cystoscopy With Insertion Of Ureteral Stent Right, Cystoscopy  With Biopsy, Cystoscopy With Biopsy, Shoulder Surgery, Elbow Surgery, Bladder Surgery, Appendectomy, Inguinal Hernia Repair   NON-GU PSH: Appendectomy - 2008 Revise Knee Joint - 10/05/2015    GU PMH: Gross hematuria - 11/17/2016, - 12/20/2015 Weak Urinary Stream - 08/18/2016 Acute Cystitis/UTI (Improving) - 10/24/2015, - 10/17/2015, Acute cystitis without hematuria, - 10/10/2015 BPH w/LUTS - 10/24/2015 History of bladder cancer, History of bladder cancer - 10/10/2015, History of bladder cancer, - 2016, History of malignant neoplasm of bladder, - 2015 BPH w/o LUTS, Enlarged prostate without lower urinary tract symptoms (luts) - 10/05/2015 ED due to arterial insufficiency, Erectile dysfunction due to arterial insufficiency - 03/16/2015 Bladder Cancer, Unspec, Bladder cancer - 2014      PMH Notes:  Past Gu Hx:    [name] returns for follow-up. We got involved in his care in 2010 due to transitional cell carcinoma involving his distal right ureter. Prior to that, Dr. 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 He is status post right ureteral resection with reimplantation due to recurrent transitional cell carcinoma of the bladder and involvement of the distal right ureter (June 2010). Again, we felt nephroureterectomy would be overaggressive therapy. The patient had reassessment (02/2009). This included a cystogram which showed nice reflux up the right side with no evidence of stricturing, no evidence of filling defects. We were able to drive the ureteroscope right up the ureter as well and has had no evidence of recurrence. Urine cytology was unremarkable and there was no evidence of any bladder cancer. Overall we were quite pleased with things and he has recovered from that procedure very nicely. He continues to do well. Follow up 06/2009 including cystoscopy and NMP-22 was negative.    Follow up 10/2009 cystoscopy and cytology  were negative.   Follow up 02/2010 cystoscopy and cytology were negative CT scan also ok.    [name] status post his most recent cystoscopy with bladder biopsy. Since my distal ureterectomy with reimplant, he has had no evidence of recurrences until his follow-up in February of 2012 where we saw some carpeting of papillary tumor. The patient recently underwent cold-cup biopsy/resection of these areas along with fulguration. Pathology did indeed confirm this to be transitional cell carcinoma, but fortunately it was low-grade and noninvasive. He has had 2 induction courses of BCG and did not really tolerate the last one that well.    [name] was noted to have several small bladder tumor recurrences in November of 2012. The patient had papillary tumors noted. These were noninvasive, felt to be low grade on the trigone. The posterior wall tumor looked mostly low grade with some small little foci of potentially higher grade tumor, but again, no evidence of invasion on either lesion. The patient did have installation of mitomycin.    His most recent recurrence was in his bladder in November of 2013. He had a solitary tumor at the dome of his bladder. Pathology revealed this to be low-grade and noninvasive.    [name] did undergo retrograde pyelography as well as cystogram to look at the upper tracts back in December 2013 and there was no evidence of filling defect or ureteral obstruction.    April of 2014. At that time cystoscopy was negative and NMP 22 testing was also negative.   August 2014: Negative cystoscopy   December 2014: Negative cystoscopy  and NMP-22 testing.   June 2015: Negative cystoscopy. Positive atypia on urine cytology with negative FISH testing   January 2016: Negative cystoscopy and negative upper tract imaging by hematuria protocol CT scan      NON-GU PMH: Encounter for general adult medical examination without abnormal findings, Encounter for preventive health examination  - Aug 28, 2013 Personal history of other diseases of the circulatory system, History of hypertension - 08/28/2012 Personal history of other diseases of the digestive system, History of esophageal reflux - 28-Aug-2012 Personal history of other diseases of the nervous system and sense organs, History of sleep apnea - Aug 28, 2012 Personal history of other endocrine, nutritional and metabolic disease, History of hypercholesterolemia - 08-28-2012, History of diabetes mellitus, - 28-Aug-2012 Personal history of other mental and behavioral disorders, History of depression - 28-Aug-2012    FAMILY HISTORY: Death In The Family Father - Father Death In The Family Mother - Mother Diabetes - Grandmother   SOCIAL HISTORY: Marital Status: Single Preferred Language: English; Ethnicity: Not Hispanic Or Latino; Race: White Current Smoking Status: Patient does not smoke anymore.      Notes: Former smoker, Marital History - Single, Tobacco Use, Caffeine Use, Alcohol Use   REVIEW OF SYSTEMS:    GU Review Male:   Patient reports frequent urination, hard to postpone urination, burning/ pain with urination, and get up at night to urinate. Patient denies leakage of urine, stream starts and stops, trouble starting your stream, have to strain to urinate , erection problems, and penile pain.  Gastrointestinal (Upper):   Patient denies nausea, vomiting, and indigestion/ heartburn.  Gastrointestinal (Lower):   Patient denies diarrhea and constipation.  Constitutional:   Patient reports fatigue. Patient denies fever, night sweats, and weight loss.  Skin:   Patient denies skin rash/ lesion and itching.  Eyes:   Patient denies blurred vision and double vision.  Ears/ Nose/ Throat:   Patient denies sinus problems and sore throat.  Hematologic/Lymphatic:   Patient denies swollen glands and easy bruising.  Cardiovascular:   Patient reports leg swelling. Patient denies chest pains.  Respiratory:   Patient denies cough and shortness of breath.  Endocrine:   Patient denies  excessive thirst.  Musculoskeletal:   Patient reports back pain. Patient denies joint pain.  Neurological:   Patient reports dizziness. Patient denies headaches.  Psychologic:   Patient reports depression and anxiety.    VITAL SIGNS:      02/03/2017 03:58 PM  Weight 206 lb / 93.44 kg  BP 151/83 mmHg  Pulse 73 /min   MULTI-SYSTEM PHYSICAL EXAMINATION:    Constitutional: Well-nourished. No physical deformities. Normally developed. Good grooming.  Neck: Neck symmetrical, not swollen. Normal tracheal position.  Respiratory: No labored breathing, no use of accessory muscles. Clear to auscultation bilaterally  Cardiovascular: Normal temperature, normal extremity pulses, no swelling, no varicosities. Regular rate and rhythm  Lymphatic: No enlargement of neck, axillae, groin.  Skin: No paleness, no jaundice, no cyanosis. No lesion, no ulcer, no rash.  Neurologic / Psychiatric: Oriented to time, oriented to place, oriented to person. No depression, no anxiety, no agitation.  Gastrointestinal: No mass, no tenderness, no rigidity, non obese abdomen.  Eyes: Normal conjunctivae. Normal eyelids.  Ears, Nose, Mouth, and Throat: Left ear no scars, no lesions, no masses. Right ear no scars, no lesions, no masses. Nose no scars, no lesions, no masses. Normal hearing. Normal lips.  Musculoskeletal: Normal gait and station of head and neck.     PAST DATA REVIEWED:  Source Of History:  Patient  Records Review:   Pathology Reports, Previous Doctor Records, Previous Hospital Records, Previous Patient Records, POC Tool  X-Ray Review: C.T. Abdomen/Pelvis: Reviewed Films.     11/17/14 03/30/12 08/28/08 05/20/06  PSA  Total PSA 0.58  0.44  0.35  1.13     06/04/05  Hormones  Testosterone, Total 2.68     PROCEDURES:          Urinalysis w/Scope Dipstick Dipstick Cont'd Micro  Color: Red Bilirubin: Invalid WBC/hpf: 0 - 5/hpf  Appearance: Turbid Ketones: Invalid RBC/hpf: >60/hpf  Specific Gravity:  Invalid Blood: Invalid Bacteria: Rare (0-9/hpf)  pH: Invalid Protein: Invalid Cystals: NS (Not Seen)  Glucose: Invalid Urobilinogen: Invalid Casts: NS (Not Seen)    Nitrites: Invalid Trichomonas: Not Present    Leukocyte Esterase: Invalid Mucous: Not Present      Epithelial Cells: 0 - 5/hpf      Yeast: NS (Not Seen)      Sperm: Not Present    Notes: biochemicals invalid due to gross hematuria microscopic performed on unconcentrated urine    ASSESSMENT:      ICD-10 Details  1 GU:   Bladder Cancer, Unspec - C67.9   2   Renal pelvis cancer, 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 The tumor burden is prohibitive from this being managed successfully endoscopically. As such, recommended that we proceed with a robotic-assisted nephroureterectomy. I went through the surgery with the patient including the positioning and the associated step to successfully remove his kidney in his ureter. I told him about the potential complications related to previous surgery as well as a hernia repair. We discussed all the risks and benefits of the operation as well as the expected recovery. The patient understands that following his operation he will have a Foley catheter for 10-14 days. I spent significant time with both the patient and his family going through things in regards to the upcoming surgery. He seemed to have a firm understanding of the surgery.

## 2017-02-12 NOTE — Progress Notes (Signed)
Reassumed care from [facility]

## 2017-02-12 NOTE — Op Note (Signed)
Preoperative diagnosis:  1. right upper tract urothelial cancer  Postoperative diagnosis:  1. same   Procedure: 1. Robotic assisted laparoscopic right nephro-ureterectomy  Surgeon: [name], MD 1st assistant: Debbrah Alar, PA-C Resident Surgeon: Jess Barters, MD  Anesthesia: General  Complications: None  Intraoperative findings:  #1. The ureter had some scar tissue around it was stuck to the surrounding structures including the iliac vessels, but with gentle dissection we are able to getthe ureter free atraumatically. #2.  A nice bladder cuff was easily taken from the bladder down, the site of the patient's previous reimplant.  EBL: 50 mL  Specimens: right kidney and ureter   Indication:  Isaac [name] is a 61 y.o. patient withhistory of urothelial cancer. He recently presented with gross hematuria and was noted to have a mass in the upper pole of the right kidney. Ureteroscopy demonstrated a large tumor that was biopsied and returned as low-grade. However, the size of the mass was too large to manage endoscopically..  After reviewing the management options for treatment, he elected to proceed with the above surgical procedure(s). We have discussed the potential benefits and risks of the procedure, side effects of the proposed treatment, the likelihood of the patient achieving the goals of the procedure, and any potential problems that might occur during the procedure or recuperation. Informed consent has been obtained.   Description of procedure:  An assistant was required for this surgical procedure.  The duties of the assistant included but were not limited to suctioning, passing suture, camera manipulation, retraction. This procedure would not be able to be performed without an Environmental consultant.  The patient was taken to the operating room and a general anesthetic was administered. A Foley catheter was placed.  The patient was given preoperative antibiotics,  placed in the right modified flank position with care to pad all potential pressure points, and prepped and draped in the usual sterile fashion. Next a preoperative timeout was performed.  A site was selected on the right side of the umbilicus for placement of the camera port. This was placed using a standard modified Hassan technique with entry into the peritoneum with a 10 mm 0 deg laparoscope with a visual obturator. We entered the peritoneum without incident and established pneumoperitoneum. The camera was then used to inspect the abdomen and there was no evidence of any intra-abdominal injuries or other abnormalities. The remaining abdominal ports were then placed. 8 mm robotic ports were placed in the right upper quadrant, right lower quadrant, and lower midline abdominal wall. A 12 mm port was placed in the upper midline for laparoscopic assistance.  All ports were placed under direct vision without difficulty. The surgical cart was then docked.   Utilizing the cautery scissors, the white line of Toldt was incised allowing the colon to be mobilized medially and the plane between the mesocolon and the anterior layer of Gerota's fascia to be developed and the kidney to be exposed. The ureter and gonadal vein were identified inferiorly and the ureter was lifted anteriorly off the psoas muscle.  I placed a clip across the ureter at this time to prevent any seeding of the tumor. Dissection proceeded superiorly along the gonadal vein until the renal vein was identified. The renal hilum was then carefully isolated with a combination of blunt and sharp dissection allowing the renal arterial and venous structures to be separated and isolated in preparation for renal hilar vessel transection. Using a endovascular stapler the renal artery and vein were taken  simultaneously with once stable load.   Their surrounding perinephric fat was then freed from the adherent structures so that the kidney was now  completely mobilized and free from the surrounding structures. The dissection then proceeded down inferiorly following the ureter down to the bladder. In this area there was some adhesions along the course of the iliac vessels and in the pelvis from the patient's previous surgery. The ureter was freed from the surrounding structures atraumatically without any significant bleeding. Once down to the bladder dome, the dome was cleaned off and isolated. I then placed a stitch in the right corner of the reimplanted ureter. I then transected the ureter with a nice bladder cuff. I then used the previously placed 3-0V lock suture to close the urothelium under visual guidance in running fashion. I then used a second 2 0 V lock suture and performed a second layer closure. I then took the remaining peritoneum and closed the defect.   This point the specimen was then placed in a endoscopic bag.a tap block was performed by injecting 10 mL of long-acting local medication in 4 separate areas attempting to inject the transversus abdominis plane. I then placed an 73 [place] Blake drain in the far right lower quadrant robotic trocar removing the trocar. The drain was secured with a 3-0 nylon. The specimen bag was then brought out through the midline assistant port. The ports were all then removed under visual guidance. An upper midline incision was extended from the assistant port and the specimen was retrieved through this incision. The robotic ports were closed with a 4-0 Monocryl. The upper midline incision fascia was closed with a 0 PDS. The skin was closed with a 4-0 Monocryl. The incisions were injected with long-acting local medicine.  Dermabond was applied to the skin. The patient tolerated the procedure well and without complications. The patient was able to be extubated and transferred to the recovery unit in satisfactory condition.  [name], M.D.

## 2017-02-13 LAB — GLUCOSE, CAPILLARY
GLUCOSE-CAPILLARY: 117 mg/dL — AB (ref 65–99)
Glucose-Capillary: 231 mg/dL — ABNORMAL HIGH (ref 65–99)
Glucose-Capillary: 100 mg/dL — ABNORMAL HIGH (ref 65–99)
Glucose-Capillary: 150 mg/dL — ABNORMAL HIGH (ref 65–99)

## 2017-02-13 LAB — BASIC METABOLIC PANEL
Anion gap: 6 (ref 5–15)
BUN: 13 mg/dL (ref 6–20)
CALCIUM: 8.3 mg/dL — AB (ref 8.9–10.3)
CHLORIDE: 105 mmol/L (ref 101–111)
CO2: 26 mmol/L (ref 22–32)
Creatinine, Ser: 1.46 mg/dL — ABNORMAL HIGH (ref 0.61–1.24)
GFR calc non Af Amer: 50 mL/min — ABNORMAL LOW (ref >60)
GFR, EST AFRICAN AMERICAN: 58 mL/min — AB (ref >60)
Glucose, Bld: 123 mg/dL — ABNORMAL HIGH (ref 65–99)
Potassium: 4.9 mmol/L (ref 3.5–5.1)
Sodium: 137 mmol/L (ref 135–145)

## 2017-02-13 LAB — HEMOGLOBIN AND HEMATOCRIT, BLOOD
HEMATOCRIT: 32 % — AB (ref 39.0–52.0)
Hemoglobin: 10.2 g/dL — ABNORMAL LOW (ref 13.0–17.0)

## 2017-02-13 MED ORDER — OXYCODONE HCL 5 MG PO TABS
10.0000 mg | ORAL_TABLET | ORAL | Status: DC | PRN
  Administered 2017-02-13 – 2017-02-16 (×8): 10 mg via ORAL
  Filled 2017-02-13 (×9): qty 2

## 2017-02-13 MED ORDER — OXYCODONE HCL 10 MG PO TABS: 5.0000 mg | ORAL_TABLET | ORAL | 0 refills | Status: DC | PRN

## 2017-02-13 MED ORDER — DOCUSATE SODIUM 100 MG PO CAPS
100.0000 mg | ORAL_CAPSULE | Freq: Two times a day (BID) | ORAL | Status: DC
  Administered 2017-02-13 – 2017-02-16 (×6): 100 mg via ORAL
  Filled 2017-02-13 (×6): qty 1

## 2017-02-13 MED ORDER — HEPARIN SODIUM (PORCINE) 5000 UNIT/ML IJ SOLN
5000.0000 [IU] | Freq: Three times a day (TID) | INTRAMUSCULAR | Status: DC
  Administered 2017-02-13 – 2017-02-16 (×8): 5000 [IU] via SUBCUTANEOUS
  Filled 2017-02-13 (×8): qty 1

## 2017-02-13 MED ORDER — LIP MEDEX EX OINT
TOPICAL_OINTMENT | CUTANEOUS | Status: DC | PRN
  Filled 2017-02-13: qty 7

## 2017-02-13 MED ORDER — ACETAMINOPHEN 10 MG/ML IV SOLN
1000.0000 mg | Freq: Four times a day (QID) | INTRAVENOUS | Status: AC
  Administered 2017-02-13 – 2017-02-14 (×4): 1000 mg via INTRAVENOUS
  Filled 2017-02-13 (×4): qty 100

## 2017-02-13 MED ORDER — DOCUSATE SODIUM 100 MG PO CAPS: 100.0000 mg | ORAL_CAPSULE | Freq: Two times a day (BID) | ORAL | 0 refills | Status: DC | PRN

## 2017-02-13 MED ORDER — BLISTEX MEDICATED EX OINT
TOPICAL_OINTMENT | CUTANEOUS | Status: DC | PRN
  Filled 2017-02-13: qty 6.3

## 2017-02-13 MED ORDER — SULFAMETHOXAZOLE-TRIMETHOPRIM 800-160 MG PO TABS: 1.0000 | ORAL_TABLET | Freq: Two times a day (BID) | ORAL | 0 refills | Status: DC

## 2017-02-13 NOTE — Progress Notes (Signed)
Urology Inpatient Progress Report  RIGHT UPPER TRACT TRANSITIONAL CELL CARCINOMA  Procedure(s): XI ROBOT ASSITED LAPAROSCOPIC NEPHROURETERECTOMY  1 Day Post-Op   Intv/Subj: No acute events overnight. Patient is without complaint.  Active Problems:   Renal mass  Current Facility-Administered Medications  Medication Dose Route Frequency Provider Last Rate Last Dose  . 0.45 % sodium chloride infusion   Intravenous Continuous Debbrah Alar, PA-C 100 mL/hr at 02/13/17 0107    . amLODipine (NORVASC) tablet 10 mg  10 mg Oral QHS Dancy, Amanda, PA-C      . canagliflozin [facility]) tablet 100 mg  100 mg Oral QAC breakfast [name], MD      . diphenhydrAMINE (BENADRYL) injection 12.5-25 mg  12.5-25 mg Intravenous Q6H PRN Debbrah Alar, PA-C       Or  . diphenhydrAMINE (BENADRYL) 12.5 MG/5ML elixir 12.5-25 mg  12.5-25 mg Oral Q6H PRN Dancy, Amanda, PA-C      . glimepiride (AMARYL) tablet 2 mg  2 mg Oral QAC supper Debbrah Alar, PA-C   2 mg at 02/12/17 1805  . insulin aspart (novoLOG) injection 0-15 Units  0-15 Units Subcutaneous TID WC Debbrah Alar, PA-C   3 Units at 02/12/17 1804  . isosorbide mononitrate (IMDUR) 24 hr tablet 120 mg  120 mg Oral Daily Dancy, Amanda, PA-C      . metoprolol succinate (TOPROL-XL) 24 hr tablet 25 mg  25 mg Oral QHS Debbrah Alar, PA-C   25 mg at 02/12/17 2147  . morphine 4 MG/ML injection 2-4 mg  2-4 mg Intravenous Q2H PRN [name], MD   2 mg at 02/13/17 0500  . nitroGLYCERIN (NITROSTAT) SL tablet 0.4 mg  0.4 mg Sublingual Q5 min PRN Dancy, Amanda, PA-C      . ondansetron (ZOFRAN) injection 4 mg  4 mg Intravenous Q4H PRN Dancy, Amanda, PA-C      . oxyCODONE (Oxy IR/ROXICODONE) immediate release tablet 5 mg  5 mg Oral Q4H PRN Debbrah Alar, PA-C   5 mg at 02/12/17 2027  . pantoprazole (PROTONIX) EC tablet 40 mg  40 mg Oral QAC breakfast Dancy, Amanda, PA-C      . rOPINIRole (REQUIP) tablet 2 mg  2 mg Oral QHS Debbrah Alar, PA-C   2 mg at  02/12/17 2147  . rosuvastatin (CRESTOR) tablet 10 mg  10 mg Oral QODAY Dancy, Amanda, PA-C      . tamsulosin (FLOMAX) capsule 0.4 mg  0.4 mg Oral Daily Debbrah Alar, PA-C         Objective: Vital: Vitals:   02/12/17 2029 02/12/17 2133 02/13/17 0448 02/13/17 0611  BP: 100/70 98/73 118/77 109/76  Pulse: 76 60 (!) 54 (!) 52  Resp: (!) 21  20   Temp: 97.7 F (36.5 C)  97.8 F (36.6 C)   TempSrc: Axillary  Axillary   SpO2: 99%  96%   Weight:      Height:       I/Os: I/O last 3 completed shifts: In: 5283.3 [P.O.:480; I.V.:4333.3; Other:70; IV Piggyback:400] Out: 1540 [Urine:3840; Drains:70; Blood:50]  Physical Exam:  General: Patient is in no apparent distress Lungs: Normal respiratory effort, chest expands symmetrically. GI: Incisions are c/d/i. The abdomen is soft and nontender without mass.  Shaved area with splotchy rash JP drain with serosanguinous drainage Foley: clear  Ext: lower extremities symmetric  Lab Results:  Recent Labs  02/12/17 1249 02/13/17 0420  WBC 7.7  --   HGB 10.8* 10.2*  HCT 33.1* 32.0*    Recent  Labs  02/13/17 0420  NA 137  K 4.9  CL 105  CO2 26  GLUCOSE 123*  BUN 13  CREATININE 1.46*  CALCIUM 8.3*   No results for input(s): LABPT, INR in the last 72 hours. No results for input(s): LABURIN in the last 72 hours. Results for orders placed or performed during the hospital encounter of 02/05/17  Urine culture     Status: Abnormal   Collection Time: 02/05/17  1:08 PM  Result Value Ref Range Status   Specimen Description URINE, CLEAN CATCH  Final   Special Requests NONE  Final   Culture 70,000 COLONIES/mL YEAST (A)  Final   Report Status 02/07/2017 FINAL  Final    Studies/Results: No results found.  Assessment: Procedure(s): XI ROBOT ASSITED LAPAROSCOPIC NEPHROURETERECTOMY, 1 Day Post-Op  doing well.  Plan: Continue IV APAP, oxycodone 10mg , IV morphine PRN HLIVF ADAT Ambulate Recheck BMP in AM PT eval dispo to SNF -  lives alone.   Louis Meckel, MD Urology 02/13/2017, 7:44 AM

## 2017-02-13 NOTE — Progress Notes (Signed)
PT Cancellation Note  Patient Details Name: Isaac [name] MRN: [id number] DOB: 1956-04-26   Cancelled Treatment:    Reason Eval/Treat Not Completed: PT screened, no needs identified, will sign off   [facility] 02/13/2017, 3:10 PM

## 2017-02-14 LAB — BASIC METABOLIC PANEL
ANION GAP: 9 (ref 5–15)
BUN: 18 mg/dL (ref 6–20)
CALCIUM: 8.1 mg/dL — AB (ref 8.9–10.3)
CO2: 24 mmol/L (ref 22–32)
Chloride: 104 mmol/L (ref 101–111)
Creatinine, Ser: 1.85 mg/dL — ABNORMAL HIGH (ref 0.61–1.24)
GFR calc Af Amer: 44 mL/min — ABNORMAL LOW (ref >60)
GFR calc non Af Amer: 38 mL/min — ABNORMAL LOW (ref >60)
GLUCOSE: 153 mg/dL — AB (ref 65–99)
POTASSIUM: 4.1 mmol/L (ref 3.5–5.1)
Sodium: 137 mmol/L (ref 135–145)

## 2017-02-14 LAB — GLUCOSE, CAPILLARY
GLUCOSE-CAPILLARY: 138 mg/dL — AB (ref 65–99)
Glucose-Capillary: 86 mg/dL (ref 65–99)
Glucose-Capillary: 150 mg/dL — ABNORMAL HIGH (ref 65–99)
Glucose-Capillary: 243 mg/dL — ABNORMAL HIGH (ref 65–99)

## 2017-02-14 NOTE — Progress Notes (Addendum)
Urology Inpatient Progress Report  RIGHT UPPER TRACT TRANSITIONAL CELL CARCINOMA  Procedure(s): XI ROBOT ASSITED LAPAROSCOPIC NEPHROURETERECTOMY  2 Days Post-Op   Intv/Subj: No acute events overnight. Patient is without complaint. + flatus, no BM Cr up to 1.85, Hgb 10.2 from 10.8  Active Problems:   Renal mass  Current Facility-Administered Medications  Medication Dose Route Frequency Provider Last Rate Last Dose  . amLODipine (NORVASC) tablet 10 mg  10 mg Oral QHS Debbrah Alar, PA-C   10 mg at 02/13/17 2050  . canagliflozin (INVOKANA) tablet 100 mg  100 mg Oral QAC breakfast [name], MD   100 mg at 02/14/17 1011  . diphenhydrAMINE (BENADRYL) injection 12.5-25 mg  12.5-25 mg Intravenous Q6H PRN Debbrah Alar, PA-C       Or  . diphenhydrAMINE (BENADRYL) 12.5 MG/5ML elixir 12.5-25 mg  12.5-25 mg Oral Q6H PRN Dancy, Amanda, PA-C      . docusate sodium (COLACE) capsule 100 mg  100 mg Oral BID [name], MD   100 mg at 02/14/17 1010  . glimepiride (AMARYL) tablet 2 mg  2 mg Oral QAC supper Debbrah Alar, PA-C   2 mg at 02/13/17 1700  . heparin injection 5,000 Units  5,000 Units Subcutaneous Q8H [name], MD   5,000 Units at 02/14/17 0510  . insulin aspart (novoLOG) injection 0-15 Units  0-15 Units Subcutaneous TID WC Dancy, Amanda, PA-C   3 Units at 02/14/17 0800  . isosorbide mononitrate (IMDUR) 24 hr tablet 120 mg  120 mg Oral Daily Dancy, Amanda, PA-C   120 mg at 02/14/17 1010  . lip balm (CARMEX) ointment   Topical PRN [name], MD      . metoprolol succinate (TOPROL-XL) 24 hr tablet 25 mg  25 mg Oral QHS Debbrah Alar, PA-C   25 mg at 02/13/17 2050  . morphine 4 MG/ML injection 2-4 mg  2-4 mg Intravenous Q2H PRN [name], MD   4 mg at 02/14/17 1011  . nitroGLYCERIN (NITROSTAT) SL tablet 0.4 mg  0.4 mg Sublingual Q5 min PRN Dancy, Amanda, PA-C      . ondansetron (ZOFRAN) injection 4 mg  4 mg Intravenous Q4H PRN Debbrah Alar,  PA-C      . oxyCODONE (Oxy IR/ROXICODONE) immediate release tablet 10 mg  10 mg Oral Q4H PRN [name], MD   10 mg at 02/14/17 0620  . pantoprazole (PROTONIX) EC tablet 40 mg  40 mg Oral QAC breakfast Debbrah Alar, PA-C   40 mg at 02/14/17 1010  . rOPINIRole (REQUIP) tablet 2 mg  2 mg Oral QHS Debbrah Alar, PA-C   2 mg at 02/13/17 2049  . rosuvastatin (CRESTOR) tablet 10 mg  10 mg Oral QODAY Dancy, Amanda, PA-C   10 mg at 02/14/17 1011  . tamsulosin (FLOMAX) capsule 0.4 mg  0.4 mg Oral Daily Debbrah Alar, PA-C   0.4 mg at 02/14/17 1011     Objective: Vital: Vitals:   02/13/17 0611 02/13/17 1418 02/13/17 2029 02/14/17 0447  BP: 109/76 (!) 101/59 121/70 114/86  Pulse: (!) 52 60 83 74  Resp:  20 20 20   Temp:  (!) 97.5 F (36.4 C) 98.3 F (36.8 C) 97.7 F (36.5 C)  TempSrc:  Oral Axillary Oral  SpO2:  93% 99% 96%  Weight:      Height:       I/Os: I/O last 3 completed shifts: In: 2383.3 [P.O.:480; I.V.:1433.3; Other:70; IV Piggyback:400] Out: 9381 [WEXHB:7169; Drains:250]  Physical Exam:  General: Patient is in no apparent distress Lungs: Normal respiratory effort, chest expands symmetrically. GI: Incisions are c/d/i. The abdomen is soft and appropriately tender without mass. JP drain with serosanguinous drainage, 130/120 out Foley: draining clear yellow urine UOP 2700/800  Ext: lower extremities symmetric  Lab Results:  Recent Labs  02/12/17 1249 02/13/17 0420  WBC 7.7  --   HGB 10.8* 10.2*  HCT 33.1* 32.0*    Recent Labs  02/13/17 0420 02/14/17 0502  NA 137 137  K 4.9 4.1  CL 105 104  CO2 26 24  GLUCOSE 123* 153*  BUN 13 18  CREATININE 1.46* 1.85*  CALCIUM 8.3* 8.1*   No results for input(s): LABPT, INR in the last 72 hours. No results for input(s): LABURIN in the last 72 hours. Results for orders placed or performed during the hospital encounter of 02/05/17  Urine culture     Status: Abnormal   Collection Time: 02/05/17  1:08 PM  Result  Value Ref Range Status   Specimen Description URINE, CLEAN CATCH  Final   Special Requests NONE  Final   Culture 70,000 COLONIES/mL YEAST (A)  Final   Report Status 02/07/2017 FINAL  Final    Studies/Results: No results found.  Assessment: Procedure(s): Upper tract TCC XI ROBOT ASSITED LAPAROSCOPIC NEPHROURETERECTOMY, 2 Days Post-Op  doing well.  Plan: Continue foley until f/u OOB AM labs--awaiting for Cr to plateau. If further rise tomorrow, will consider nephrology eval Keep JP given output gen diet KVO IVF heparin dvt ppx given cr 1.85   Link Snuffer, MD Urology 02/14/2017, 10:12 AM

## 2017-02-15 LAB — CBC
HEMATOCRIT: 35.3 % — AB (ref 39.0–52.0)
Hemoglobin: 11.5 g/dL — ABNORMAL LOW (ref 13.0–17.0)
MCH: 27.3 pg (ref 26.0–34.0)
MCHC: 32.6 g/dL (ref 30.0–36.0)
MCV: 83.6 fL (ref 78.0–100.0)
PLATELETS: 205 10*3/uL (ref 150–400)
RBC: 4.22 MIL/uL (ref 4.22–5.81)
RDW: 14.2 % (ref 11.5–15.5)
WBC: 5.7 10*3/uL (ref 4.0–10.5)

## 2017-02-15 LAB — BASIC METABOLIC PANEL
ANION GAP: 9 (ref 5–15)
BUN: 20 mg/dL (ref 6–20)
CALCIUM: 8.7 mg/dL — AB (ref 8.9–10.3)
CO2: 23 mmol/L (ref 22–32)
Chloride: 105 mmol/L (ref 101–111)
Creatinine, Ser: 1.6 mg/dL — ABNORMAL HIGH (ref 0.61–1.24)
GFR calc Af Amer: 52 mL/min — ABNORMAL LOW (ref >60)
GFR calc non Af Amer: 45 mL/min — ABNORMAL LOW (ref >60)
GLUCOSE: 157 mg/dL — AB (ref 65–99)
Potassium: 4.8 mmol/L (ref 3.5–5.1)
Sodium: 137 mmol/L (ref 135–145)

## 2017-02-15 LAB — GLUCOSE, CAPILLARY
GLUCOSE-CAPILLARY: 244 mg/dL — AB (ref 65–99)
Glucose-Capillary: 164 mg/dL — ABNORMAL HIGH (ref 65–99)
Glucose-Capillary: 150 mg/dL — ABNORMAL HIGH (ref 65–99)
Glucose-Capillary: 206 mg/dL — ABNORMAL HIGH (ref 65–99)

## 2017-02-15 LAB — CREATININE, FLUID (PLEURAL, PERITONEAL, JP DRAINAGE): Creat, Fluid: 1.6 mg/dL

## 2017-02-15 MED ORDER — BISACODYL 10 MG RE SUPP
10.0000 mg | Freq: Every day | RECTAL | Status: DC | PRN
  Filled 2017-02-15: qty 1

## 2017-02-15 NOTE — Progress Notes (Signed)
Urology Inpatient Progress Report  RIGHT UPPER TRACT TRANSITIONAL CELL CARCINOMA  Procedure(s): XI ROBOT ASSITED LAPAROSCOPIC NEPHROURETERECTOMY  3 Days Post-Op   Intv/Subj: No acute events overnight. Patient is without except some mild pain. Cr improved hgb stable  Active Problems:   Renal mass  Current Facility-Administered Medications  Medication Dose Route Frequency Provider Last Rate Last Dose  . amLODipine (NORVASC) tablet 10 mg  10 mg Oral QHS Debbrah Alar, PA-C   10 mg at 02/14/17 2211  . bisacodyl (DULCOLAX) suppository 10 mg  10 mg Rectal Daily PRN [name], MD      . diphenhydrAMINE (BENADRYL) injection 12.5-25 mg  12.5-25 mg Intravenous Q6H PRN Debbrah Alar, PA-C       Or  . diphenhydrAMINE (BENADRYL) 12.5 MG/5ML elixir 12.5-25 mg  12.5-25 mg Oral Q6H PRN Dancy, Amanda, PA-C      . docusate sodium (COLACE) capsule 100 mg  100 mg Oral BID [name], MD   100 mg at 02/15/17 0813  . glimepiride (AMARYL) tablet 2 mg  2 mg Oral QAC supper Debbrah Alar, PA-C   2 mg at 02/14/17 1611  . heparin injection 5,000 Units  5,000 Units Subcutaneous Q8H [name], MD   5,000 Units at 02/15/17 0525  . insulin aspart (novoLOG) injection 0-15 Units  0-15 Units Subcutaneous TID WC Debbrah Alar, PA-C   3 Units at 02/15/17 0813  . isosorbide mononitrate (IMDUR) 24 hr tablet 120 mg  120 mg Oral Daily Debbrah Alar, PA-C   120 mg at 02/15/17 0814  . lip balm (CARMEX) ointment   Topical PRN [name], MD      . metoprolol succinate (TOPROL-XL) 24 hr tablet 25 mg  25 mg Oral QHS Debbrah Alar, PA-C   25 mg at 02/14/17 2211  . morphine 4 MG/ML injection 2-4 mg  2-4 mg Intravenous Q2H PRN [name], MD   4 mg at 02/15/17 0830  . nitroGLYCERIN (NITROSTAT) SL tablet 0.4 mg  0.4 mg Sublingual Q5 min PRN Dancy, Amanda, PA-C      . ondansetron (ZOFRAN) injection 4 mg  4 mg Intravenous Q4H PRN Debbrah Alar, PA-C   4 mg at 02/15/17 0535  .  oxyCODONE (Oxy IR/ROXICODONE) immediate release tablet 10 mg  10 mg Oral Q4H PRN [name], MD   10 mg at 02/15/17 1114  . pantoprazole (PROTONIX) EC tablet 40 mg  40 mg Oral QAC breakfast Debbrah Alar, PA-C   40 mg at 02/15/17 0814  . rOPINIRole (REQUIP) tablet 2 mg  2 mg Oral QHS Debbrah Alar, PA-C   2 mg at 02/14/17 2211  . rosuvastatin (CRESTOR) tablet 10 mg  10 mg Oral QODAY Dancy, Estill Bamberg, PA-C   10 mg at 02/15/17 0814  . tamsulosin (FLOMAX) capsule 0.4 mg  0.4 mg Oral Daily Debbrah Alar, PA-C   0.4 mg at 02/15/17 0813     Objective: Vital: Vitals:   02/14/17 0447 02/14/17 1333 02/14/17 2030 02/15/17 0539  BP: 114/86 140/74 (!) 152/88 128/82  Pulse: 74 (!) 107 97 79  Resp: 20 18 18 18   Temp: 97.7 F (36.5 C) 98.9 F (37.2 C) 100 F (37.8 C) 98.9 F (37.2 C)  TempSrc: Oral Axillary Oral Oral  SpO2: 96% 90% 95% 94%  Weight:      Height:       I/Os: I/O last 3 completed shifts: In: 58 [P.O.:480] Out: 8040 [WUJWJ:1914; Drains:1510]  Physical Exam:  Physical Exam:  General:  Patient is in no apparent distress Lungs: Normal respiratory effort, chest expands symmetrically. GI: Incisions are c/d/i. The abdomen is soft and appropriately tender without mass. JP drain with serosanguinous drainage  Foley: draining clear yellow urine, good UOP Ext: lower extremities symmetric  Lab Results:  Recent Labs  02/12/17 1249 02/13/17 0420 02/15/17 0509  WBC 7.7  --  5.7  HGB 10.8* 10.2* 11.5*  HCT 33.1* 32.0* 35.3*    Recent Labs  02/13/17 0420 02/14/17 0502 02/15/17 0509  NA 137 137 137  K 4.9 4.1 4.8  CL 105 104 105  CO2 26 24 23   GLUCOSE 123* 153* 157*  BUN 13 18 20   CREATININE 1.46* 1.85* 1.60*  CALCIUM 8.3* 8.1* 8.7*   No results for input(s): LABPT, INR in the last 72 hours. No results for input(s): LABURIN in the last 72 hours. Results for orders placed or performed during the hospital encounter of 02/05/17  Urine culture     Status: Abnormal    Collection Time: 02/05/17  1:08 PM  Result Value Ref Range Status   Specimen Description URINE, CLEAN CATCH  Final   Special Requests NONE  Final   Culture 70,000 COLONIES/mL YEAST (A)  Final   Report Status 02/07/2017 FINAL  Final    Studies/Results: No results found.  Assessment: Procedure(s): right upper tract TCC XI ROBOT ASSITED LAPAROSCOPIC NEPHROURETERECTOMY, 3 Days Post-Op  doing well.   Plan: Continue foley until f/u OOB No further am labs Keep JP given output, will check JP creatinine gen diet KVO IVF heparin dvt ppx  Ready for DC pending social work eval and rehab placement   Link Snuffer, MD Urology 02/15/2017, 11:37 AM

## 2017-02-16 ENCOUNTER — Emergency Department (HOSPITAL_COMMUNITY)
Admission: EM | Admit: 2017-02-16 | Discharge: 2017-02-17 | Disposition: A | Discharge status: Home / Self Care | Payer: Medicare Other | Attending: Emergency Medicine | Admitting: Emergency Medicine

## 2017-02-16 ENCOUNTER — Encounter (HOSPITAL_COMMUNITY): Payer: Self-pay | Admitting: Emergency Medicine

## 2017-02-16 DIAGNOSIS — Z8551 Personal history of malignant neoplasm of bladder: Secondary | ICD-10-CM | POA: Insufficient documentation

## 2017-02-16 DIAGNOSIS — I10 Essential (primary) hypertension: Secondary | ICD-10-CM | POA: Insufficient documentation

## 2017-02-16 DIAGNOSIS — T83091A Other mechanical complication of indwelling urethral catheter, initial encounter: Secondary | ICD-10-CM | POA: Diagnosis not present

## 2017-02-16 DIAGNOSIS — I252 Old myocardial infarction: Secondary | ICD-10-CM | POA: Insufficient documentation

## 2017-02-16 DIAGNOSIS — Z87891 Personal history of nicotine dependence: Secondary | ICD-10-CM | POA: Insufficient documentation

## 2017-02-16 DIAGNOSIS — Y828 Other medical devices associated with adverse incidents: Secondary | ICD-10-CM | POA: Insufficient documentation

## 2017-02-16 DIAGNOSIS — T83098A Other mechanical complication of other indwelling urethral catheter, initial encounter: Secondary | ICD-10-CM | POA: Diagnosis not present

## 2017-02-16 DIAGNOSIS — Z7984 Long term (current) use of oral hypoglycemic drugs: Secondary | ICD-10-CM | POA: Insufficient documentation

## 2017-02-16 DIAGNOSIS — Z96653 Presence of artificial knee joint, bilateral: Secondary | ICD-10-CM | POA: Insufficient documentation

## 2017-02-16 DIAGNOSIS — Z79899 Other long term (current) drug therapy: Secondary | ICD-10-CM | POA: Insufficient documentation

## 2017-02-16 DIAGNOSIS — Z85828 Personal history of other malignant neoplasm of skin: Secondary | ICD-10-CM | POA: Insufficient documentation

## 2017-02-16 LAB — GLUCOSE, CAPILLARY
GLUCOSE-CAPILLARY: 282 mg/dL — AB (ref 65–99)
Glucose-Capillary: 135 mg/dL — ABNORMAL HIGH (ref 65–99)

## 2017-02-16 MED ORDER — ACETAMINOPHEN 500 MG PO TABS
1000.0000 mg | ORAL_TABLET | Freq: Four times a day (QID) | ORAL | Status: DC | PRN
  Administered 2017-02-16: 1000 mg via ORAL
  Filled 2017-02-16: qty 2

## 2017-02-16 MED ORDER — OXYCODONE HCL 5 MG PO TABS
5.0000 mg | ORAL_TABLET | ORAL | Status: DC | PRN
  Administered 2017-02-16 (×2): 15 mg via ORAL
  Filled 2017-02-16: qty 3
  Filled 2017-02-16: qty 1

## 2017-02-16 NOTE — Clinical Social Work Note (Addendum)
Clinical Social Work Assessment  Patient Details  Name: Isaac [name] MRN: [id number] Date of Birth: 1955/12/01  Date of referral:  02/16/17               Reason for consult:  Facility Placement                Permission sought to share information with:  Case Manager Permission granted to share information::  Yes, Verbal Permission Granted  Name::        Agency::     Relationship::     Contact Information:     Housing/Transportation Living arrangements for the past 2 months:  Single Family Home Source of Information:  Patient Patient Interpreter Needed:  None Criminal Activity/Legal Involvement Pertinent to Current Situation/Hospitalization:  No - Comment as needed Significant Relationships:   (unknown) Lives with:  Self Do you feel safe going back to the place where you live?    Need for family participation in patient care:  No (Coment)  Care giving concerns: CSW consulted for SNF placement. PT signed off, no follow up recommended. Patient reported that he lives alone and needs SNF for approx 1 week to heal from surgery.    Social Worker assessment / plan:  CSW spoke with patient at bedside and explained SNF placement process. Patient reported that he felt he needed SNF due to incision from surgery being sore and difficulty of doing things around the house for himself. CSW explained to patient that his concerns were considered custodial care and that his insurance doesn't cover custodial care. CSW inquired about patient's ability to private pay, patient reported that he was on disability and would not be able to private pay. CSW agreed to inform RNCM that patient needed additional assistance in the home, patient agreeable. CSW signing off.  Employment status:  Disabled (Comment on whether or not currently receiving Disability) Insurance information:  Managed Medicare PT Recommendations:  No Follow Up Information / Referral to community resources:  Other (Comment Required)  (RNCM)  Patient/Family's Response to care:  Patient agreeable to speaking with RNCM.  Patient/Family's Understanding of and Emotional Response to Diagnosis, Current Treatment, and Prognosis:  Patient expressed concerns about dc home alone due to incision from surgery, CSW validated patient's concerns. Patient reported that he would prefer to go to SNF for 1 week, CSW explained insurance process and that it would not be covered. CSW informed patient that RNCM could speak to him about other resources, patient agreeable.  Emotional Assessment Appearance:  Appears stated age Attitude/Demeanor/Rapport:  Other (cooperative) Affect (typically observed):  Appropriate Orientation:  Oriented to Self, Oriented to Place, Oriented to  Time, Oriented to Situation Alcohol / Substance use:  Not Applicable Psych involvement (Current and /or in the community):  No (Comment)  Discharge Needs  Concerns to be addressed:  Lack of Support Readmission within the last 30 days:  No Current discharge risk:  Lives alone Barriers to Discharge:  No Barriers Identified   Burnis Medin, LCSW 02/16/2017, 10:39 AM

## 2017-02-16 NOTE — Discharge Summary (Signed)
[facility] Discharge Summary  Admit date: 02/12/2017  Discharge date and time: 02/16/17   Discharge to: [place]  Discharge Service: Urology  Discharge Attending Physician:  Dr. Louis Meckel  Discharge  Diagnoses: Right upper tract urothelial carcinoma  Secondary Diagnosis: Active Problems:   Renal mass   OR Procedures: Procedure(s): XI ROBOT ASSITED LAPAROSCOPIC NEPHROURETERECTOMY 02/12/2017   Ancillary Procedures: None   Discharge Day Services: The patient was seen and examined by the Urology team both in the morning and immediately prior to discharge.  Vital signs and laboratory values were stable and within normal limits.  The physical exam was benign and unchanged and all surgical wounds were examined.  Discharge instructions were explained and all questions answered.  Subjective  No acute events overnight. Pain Controlled. No fever or chills.  Objective Patient Vitals for the past 8 hrs:  BP Temp Temp src Pulse Resp SpO2  02/16/17 0554 119/70 98.2 F (36.8 C) Oral 75 16 97 %  02/16/17 0149 - 99.4 F (37.4 C) Oral - - -   No intake/output data recorded.  General Appearance:        No acute distress Lungs:                       Normal work of breathing on room air Heart:                                Regular rate and rhythm Abdomen:                         Soft, non-tender, non-distended. Former JP site dressed with [employer]. GU:         Foley in place draining clear yellow urine Extremities:                      Warm and well perfused   Hospital Course:  The patient underwent a right robotic nephro-ureterectomy on 02/12/2017.  The patient tolerated the procedure well, was extubated in the OR, and afterwards was taken to the PACU for routine post-surgical care. When stable the patient was transferred to the floor.   The patient did well postoperatively.  The patient's diet was slowly advanced and at the time of discharge was tolerating a regular diet.  His JP was checked for Cr, and was removed after a negative study. Given social issues, specifically lack of any support at home, the patient was discharged to a SNF 4 Days Post-Op, at which point was tolerating a regular solid diet, have adequate pain control with P.O. pain medication, and could ambulate without difficulty. The patient will follow up with [place] for post op check and Foley catheter removal on 02/23/17. He will start Bactrim 1 day prior to his clinic appointment.  Condition at Discharge: Improved  Discharge Medications:  Allergies as of 02/16/2017      Reactions   Dilaudid [hydromorphone] Nausea And Vomiting   Hydrocodone Itching, Nausea And Vomiting      Medication List    STOP taking these medications   amoxicillin 500 MG capsule Commonly known as:  AMOXIL   aspirin EC 81 MG tablet   HYDROcodone-acetaminophen 5-325 MG tablet Commonly known as:  NORCO/VICODIN   HYDROcodone-homatropine 5-1.5 MG/5ML syrup Commonly known as:  HYCODAN   naproxen 500 MG tablet Commonly known as:  NAPROSYN   traMADol 50 MG tablet  Commonly known as:  ULTRAM     TAKE these medications   amLODipine 10 MG tablet Commonly known as:  NORVASC Take 1 tablet (10 mg total) by mouth daily. Hold for SBP < 110 What changed:  when to take this  additional instructions   canagliflozin 300 MG Tabs tablet Commonly known as:  INVOKANA Take 1 tablet (300 mg total) by mouth daily. What changed:  how much to take  when to take this   docusate sodium 100 MG capsule Commonly known as:  COLACE Take 1 capsule (100 mg total) by mouth 2 (two) times daily as needed (take to keep stool soft.).   glimepiride 2 MG tablet Commonly known as:  AMARYL TAKE 1 BY MOUTH DAILY BEFORE SUPPER What changed:  how much to take  how to take this  when to take this  additional instructions   isosorbide mononitrate 120 MG 24 hr tablet Commonly known as:  IMDUR Take 1 tablet (120 mg total) by mouth every  morning. What changed:  when to take this   metFORMIN 1000 MG tablet Commonly known as:  GLUCOPHAGE Take 1 tablet (1,000 mg total) by mouth 2 (two) times daily with a meal.   metoprolol succinate 25 MG 24 hr tablet Commonly known as:  TOPROL-XL Take 25 mg by mouth at bedtime.   metoprolol succinate 50 MG 24 hr tablet Commonly known as:  TOPROL-XL Take 1 tablet (50 mg total) by mouth daily. Take with or immediately following a meal. Hold for SBP <110 ID<60   nitroGLYCERIN 0.4 MG SL tablet Commonly known as:  NITROSTAT Place 1 tablet (0.4 mg total) under the tongue every 5 (five) minutes as needed for chest pain.   Oxycodone HCl 10 MG Tabs Take 0.5-1 tablets (5-10 mg total) by mouth every 4 (four) hours as needed for moderate pain.   pantoprazole 40 MG tablet Commonly known as:  PROTONIX Take 1 tablet (40 mg total) by mouth every morning. What changed:  when to take this   rOPINIRole 2 MG tablet Commonly known as:  REQUIP Take 1 tablet (2 mg total) by mouth at bedtime.   rosuvastatin 10 MG tablet Commonly known as:  CRESTOR TAKE 1 TAB (10 MG) BY MOUTH EVERY OTHER DAY What changed:  how much to take  how to take this  when to take this  additional instructions   sulfamethoxazole-trimethoprim 800-160 MG tablet Commonly known as:  BACTRIM DS,SEPTRA DS Take 1 tablet by mouth 2 (two) times daily. Start taking one day prior to your appointment for your first follow-up and catheter removal.  Continue taking for three days.   tamsulosin 0.4 MG Caps capsule Commonly known as:  FLOMAX Take 0.4 mg by mouth.

## 2017-02-16 NOTE — ED Notes (Signed)
Bed: CB44 Expected date:  Expected time:  Means of arrival:  Comments: 61 yo M/Catheter issues

## 2017-02-16 NOTE — ED Provider Notes (Signed)
[facility] DEPT Provider Note   CSN: [id number] Arrival date & time: 02/16/17  2317     History   Chief Complaint Chief Complaint  Patient presents with  . Urinary Retention    foley not working     HPI Isaac [name] is a 61 y.o. male.  The history is provided by the patient.  He was discharged in the hospital earlier today following right nephrectomy He was sent home with an indwelling Foley catheter. Hefell sleep on his recliner, and woke up with a sense of pressure and burning in the lower abdomen. He noted that there was no drainage in his Foley bag. He has also been urinating around the Foley catheter. He denies fever or chills. He denies nausea or vomiting.  Past Medical History:  Diagnosis Date  . Arthritis   . Bladder cancer [facility]) followed by dr Risa Grill  . Coronary artery disease    followed by Dr. [name]; Lexiscan Myoview (10/14):  Low risk, EF 57%, small inf infarct  . Coronary vasospasm ([place]) PER DR ROSS NOTE 03-22-2012--  INTER. TIGHTNESS   PER PT PROBABLE FROM STRESS  . Depression    no rx  . Diabetes mellitus    followed by Dr. Dwyane Dee  . Dyslipidemia    pt unable to tolerate niaspan  . GERD (gastroesophageal reflux disease)   . History of angina CHRONIC -- CONTROLLED W/ IMDUR  . 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 History of non-ST elevation myocardial infarction (NSTEMI) 2005  . Hypertension   . Myocardial infarction ([place])   . OSA on CPAP   . Peripheral neuropathy RIGHT LEG NUMBNESS  . Pneumonia     Patient Active Problem List   Diagnosis Date Noted  . Renal mass 02/12/2017  . Non-ST elevation MI (NSTEMI) ([place]) 10/18/2016  . Peripheral neuropathy   . OSA on CPAP   . GERD (gastroesophageal reflux disease)   . Type 2 diabetes mellitus ([place])   . Bladder cancer ([place])   . Gout 10/22/2015  . History of adenomatous polyp of colon 09/07/2013  . Anxiety and depression 06/19/2011  . History of  skin cancer 06/19/2011  . Hyperlipidemia 05/10/2008  . CAD, NATIVE VESSEL 05/10/2008  . RESTLESS LEGS SYNDROME 11/17/2007  . Essential hypertension 11/16/2007    Past Surgical History:  Procedure Laterality Date  . APPENDECTOMY  08-03-2004  . CARDIAC CATHETERIZATION  x3  last one 06-17-2007   MILD NON-OBSTRUCTIVE CAD/ NORMAL LVF/ 30% LEFT RENAL ARTERY STENOSIS  . CYSTO/ BILATERAL RETROGRADE PYELOGRAM/ RIGHT URETEROSCOPIC LASER FULGURATION URETERAL TUMOR  02-16-2008  . CYSTO/ BLADDER BX  09-13-2008;  08-13-2007; 04-19-2007; 06-01-2006  . CYSTO/ CYSTOGRAM/ URETEROSCOPY  02-21-2009  . CYSTO/ RESECTION BLADDER TUMOR/ LEFT RETROGRADE PYELOGRAM/ RIGHT URETEROSCOPY  08-07-2010   TCC OF BLADDER   S/P DISTAL URETERECTOMY/ REIMPLANTATION  . CYSTO/ RIGHT URETEROSCOPY/ BX URETERAL TUMOR  10-11-2008  . CYSTOSCOPY W/ RETROGRADES  04/19/2012   Procedure: CYSTOSCOPY WITH RETROGRADE PYELOGRAM;  Surgeon: Bernestine Amass, MD;  [facility];  Service: Urology;  Laterality: Bilateral;  30 MINS Cysto, Biopsy, possible TURBT, Bilateral retrograde pyelograms with Mitomycin C instilation Post op   . CYSTOSCOPY WITH BIOPSY  04/07/2011   Procedure: CYSTOSCOPY WITH BIOPSY/ RIGHT URETEROSCOPY;  Surgeon: Bernestine Amass, MD;  Location[facility];  Service: Urology;  Laterality: N/A;    cystoscopy, cystogram, biopsy and fulgeration  . CYSTOSCOPY WITH BIOPSY  12/29/2011  Procedure: CYSTOSCOPY WITH BIOPSY;  Surgeon: Bernestine Amass, MD;  Location: [facility];  Service: Urology;  Laterality: N/A;  [place]    . ELBOW SURGERY  07-07-2003   RIGHT ELBOW ARTHROSCOPY W/ OPEN RECONSTRUCTION  . KNEE ARTHROSCOPY W/ DEBRIDEMENT  02-08-2004   RIGHT KNEE  . RHINOPLASTY W/ MODIFIED CARTILAGE GRAFT  05-17-2007   INTERNAL NASAL VALVE COLLAPSE/ OSA/ SEPTAL PERFERATION  . RIGHT DISTAL URETERECTOMY W/ REIMPLANTATION  10-30-2008   TCC OF RIGHT DISTAL URETER  .  ROBOT ASSITED LAPAROSCOPIC NEPHROURETERECTOMY Right 02/12/2017   Procedure: XI ROBOT ASSITED LAPAROSCOPIC NEPHROURETERECTOMY;  Surgeon: [name], MD;  Location: [facility];  Service: Urology;  Laterality: Right;  . SHOULDER SURGERY  2005   RIGHT ROTATOR CUFF REPAIR  . TOTAL KNEE ARTHROPLASTY Left 05/18/2015  . TOTAL KNEE ARTHROPLASTY Left 05/18/2015   Procedure: TOTAL KNEE ARTHROPLASTY;  Surgeon: Earlie Server, MD;  Location: [facility];  Service: Orthopedics;  Laterality: Left;  . TOTAL KNEE ARTHROPLASTY Right 03/21/2016   Procedure: TOTAL KNEE ARTHROPLASTY;  Surgeon: Earlie Server, MD;  Location: [place] [facility];  Service: Orthopedics;  Laterality: Right;  . TRANSURETHRAL RESECTION OF BLADDER TUMOR  01-13-2007  . TRANSURETHRAL RESECTION OF BLADDER TUMOR  04/19/2012   Procedure: TRANSURETHRAL RESECTION OF BLADDER TUMOR (TURBT);  Surgeon: Bernestine Amass, MD;  Location: [facility];  Service: Urology;  Laterality: N/A;  . umbillical hernia repair  2004       Home Medications    Prior to Admission medications   Medication Sig Start Date End Date Taking? Authorizing Provider  amLODipine (NORVASC) 10 MG tablet Take 1 tablet (10 mg total) by mouth daily. Hold for SBP < 110 Patient taking differently: Take 10 mg by mouth at bedtime. Hold for SBP < 110 03/31/16   [name], MD  canagliflozin (INVOKANA) 300 MG TABS tablet Take 1 tablet (300 mg total) by mouth daily. Patient taking differently: Take 150 mg by mouth daily before breakfast.  11/28/15   Elayne Snare, MD  docusate sodium (COLACE) 100 MG capsule Take 1 capsule (100 mg total) by mouth 2 (two) times daily as needed (take to keep stool soft.). 02/13/17   [name], MD  glimepiride (AMARYL) 2 MG tablet TAKE 1 BY MOUTH DAILY BEFORE SUPPER Patient taking differently: Take 2 mg by mouth daily before supper.  11/28/15   Elayne Snare, MD  isosorbide mononitrate (IMDUR) 120 MG 24 hr tablet Take 1 tablet (120 mg total) by mouth  every morning. Patient taking differently: Take 120 mg by mouth daily.  03/31/16   [name], MD  metFORMIN (GLUCOPHAGE) 1000 MG tablet Take 1 tablet (1,000 mg total) by mouth 2 (two) times daily with a meal. 12/25/14   Elayne Snare, MD  metoprolol succinate (TOPROL-XL) 25 MG 24 hr tablet Take 25 mg by mouth at bedtime.    [provider]  metoprolol succinate (TOPROL-XL) 50 MG 24 hr tablet Take 1 tablet (50 mg total) by mouth daily. Take with or immediately following a meal. Hold for SBP <110 ID<60 Patient not taking: Reported on 02/04/2017 03/12/16   [name], MD  nitroGLYCERIN (NITROSTAT) 0.4 MG SL tablet Place 1 tablet (0.4 mg total) under the tongue every 5 (five) minutes as needed for chest pain. 11/29/15   [name], MD  oxyCODONE 10 MG TABS Take 0.5-1 tablets (5-10 mg total) by mouth every 4 (four) hours as needed for moderate pain. 02/13/17  [name], MD  pantoprazole (PROTONIX) 40 MG tablet Take 1 tablet (40 mg total) by mouth every morning. Patient taking differently: Take 40 mg by mouth daily before breakfast.  03/31/16   [name], MD  rOPINIRole (REQUIP) 2 MG tablet Take 1 tablet (2 mg total) by mouth at bedtime. 10/23/16   Elby Showers, MD  rosuvastatin (CRESTOR) 10 MG tablet TAKE 1 TAB (10 MG) BY MOUTH EVERY OTHER DAY Patient taking differently: Take 10 mg by mouth every other day. In the morning. 03/12/16   [name], MD  sulfamethoxazole-trimethoprim (BACTRIM DS,SEPTRA DS) 800-160 MG tablet Take 1 tablet by mouth 2 (two) times daily. Start taking one day prior to your appointment for your first follow-up and catheter removal.  Continue taking for three days. 02/13/17   [name], MD  tamsulosin (FLOMAX) 0.4 MG CAPS capsule Take 0.4 mg by mouth.    [provider]    Family History Family History  Problem Relation Age of Onset  . Coronary artery disease Father   . Diabetes Maternal Grandmother   . Heart disease Paternal  Grandfather   . Colon cancer Neg Hx   . Stomach cancer Neg Hx     Social History Social History  Substance Use Topics  . Smoking status: Former Smoker    Packs/day: 0.50    Years: 20.00    Types: Cigarettes    Quit date: 05/13/2007  . Smokeless tobacco: Never Used  . Alcohol use Yes     Comment: social     Allergies   Dilaudid [hydromorphone] and Hydrocodone   Review of Systems Review of Systems  All other systems reviewed and are negative.    Physical Exam Updated Vital Signs BP 138/86 (BP Location: Right Arm)   Pulse 98   Temp 99 F (37.2 C) (Oral)   Resp 18   SpO2 94%   Physical Exam  Nursing note and vitals reviewed.  61 year old male, resting comfortably and in no acute distress. Vital signs are normal. Oxygen saturation is 94%, which is normal. Head is normocephalic and atraumatic. PERRLA, EOMI. Oropharynx is clear. Neck is nontender and supple without adenopathy or JVD. Back is nontender and there is no CVA tenderness. Lungs are clear without rales, wheezes, or rhonchi. Chest is nontender. Heart has regular rate and rhythm without murmur. Abdomen is soft, mildly distended. There is tenderness across lower abdomen. Surgical scar in the midline and pillars to be healing well without signs of infection. Peristalsis is present.  Genitalia: Circumcised penis. Foley catheter in place. Testes descended. Extremities have no cyanosis or edema, full range of motion is present. Skin is warm and dry without rash. Neurologic: Mental status is normal, cranial nerves are intact, there are no motor or sensory deficits.  ED Treatments / Results   Procedures Procedures (including critical care time)  Medications Ordered in ED Medications - No data to display   Initial Impression / Assessment and Plan / ED Course  I have reviewed the triage vital signs and the nursing notes.  Pertinent labs & imaging results that were available during my care of the patient were  reviewed by me and considered in my medical decision making (see chart for details).  Foley catheter malfunction. We'll check bladder scan and irrigate catheter. May need to replace catheter. Old records are reviewed, confirming recent hospitalization for right nephrectomy for urothelial carcinoma.  Patient was not able to tolerate bladder scan. Foley was irrigated and  small bit of tissue was obtained which apparently was obstructing the catheter. It is draining well following irrigation. He is discharged with a sections to resume routine postoperative care and follow-up with urology as scheduled.  Final Clinical Impressions(s) / ED Diagnoses   Final diagnoses:  Obstructed Foley catheter, initial encounter [facility])    New Prescriptions New Prescriptions   No medications on file     Delora Fuel, MD 20/80/22 [phone]

## 2017-02-16 NOTE — Care Management Note (Signed)
Case Management Note  Patient Details  Name: Isaac [name] MRN: [id number] Date of Birth: [birth year]  Subjective/Objective:  Patient from home alone.Spoke to patient(mother,& sister on speaker phone) about HHC/private duty care services(out of pocket cost/custodial level care)-informed that HHC(intermittent skilled services)-a skilled need must be present-patient has a f/c, & drain site w/gauze-if there was a documented deficit w/emptying f/c drainage bag, or wound care w/treatment needed to teach patient-then [place] would  be covered.  Nurse aware to teach prior d/c-f/c care & if a deficit is identified then [facility] can be provided w/HHC orders from attending, w/face to face order. PT-no identified skilled needs;signed off. Provided patient w/HHC agency list, & private duty care list as resource-await orders if needs identified.Patient/mother/sister voiced understanding.                    Action/Plan:d/c plan home.   Expected Discharge Date:  02/16/17               Expected Discharge Plan:  Home/Self Care  In-House Referral:     Discharge planning Services  CM Consult  Post Acute Care Choice:    Choice offered to:     DME Arranged:    DME Agency:     HH Arranged:    HH Agency:     Status of Service:  Completed, signed off  If discussed at [employer] of Stay Meetings, dates discussed:    Additional Comments:  Dessa Phi, RN 02/16/2017, 12:57 PM

## 2017-02-16 NOTE — Care Management Note (Signed)
Case Management Note  Patient Details  Name: RUSSELL QUINNEY MRN: [id number] Date of Birth: [birth year]  Subjective/Objective: 61 y/o m admitted w/Renal mass. From home.                   Action/Plan:d/c home.   Expected Discharge Date:  02/16/17               Expected Discharge Plan:  Home/Self Care  In-House Referral:     Discharge planning Services  CM Consult  Post Acute Care Choice:    Choice offered to:     DME Arranged:    DME Agency:     HH Arranged:    HH Agency:     Status of Service:  Completed, signed off  If discussed at [employer] of Stay Meetings, dates discussed:    Additional Comments:  Dessa Phi, RN 02/16/2017, 8:41 AM

## 2017-02-16 NOTE — Care Management Important Message (Signed)
Important Message  Patient Details  Name: Isaac [name] MRN: [id number] Date of Birth: 12/12/1955   Medicare Important Message Given:  Yes    Kerin Salen 02/16/2017, 1:05 PMImportant Message  Patient Details  Name: Isaac [name] MRN: [id number] Date of Birth: [birth year]   Medicare Important Message Given:  Yes    Kerin Salen 02/16/2017, 1:05 PM

## 2017-02-16 NOTE — ED Triage Notes (Signed)
Pt comes to ed, via ems, was d/c today from 4th floor.  Pt had right kidney moved last week.  Pt now complains of cathter not draining properly and urinary urgency and burning, pt sates he was peeing around the foley cath  V/s on arrival 140/89, pulse 86, rr18, cbg 243, sp02 98 room air. Hx of diabetes, kidney cancer,  Pt comes from home, alertx4

## 2017-02-17 NOTE — ED Notes (Signed)
irrigated foley with success, pt now urinating yellow golden urine, on irrigation pt passed a small white tissue clot.

## 2017-02-17 NOTE — Discharge Instructions (Signed)
Resume routine foley catheter care. Return if you are having any problems.

## 2017-02-19 DIAGNOSIS — C651 Malignant neoplasm of right renal pelvis: Secondary | ICD-10-CM | POA: Diagnosis not present

## 2017-02-23 DIAGNOSIS — C651 Malignant neoplasm of right renal pelvis: Secondary | ICD-10-CM | POA: Diagnosis not present

## 2017-03-06 ENCOUNTER — Other Ambulatory Visit: Payer: Self-pay | Admitting: *Deleted

## 2017-03-06 MED ORDER — ISOSORBIDE MONONITRATE ER 120 MG PO TB24: 120.0000 mg | ORAL_TABLET | Freq: Every day | ORAL | 2 refills | Status: DC

## 2017-03-06 MED ORDER — PANTOPRAZOLE SODIUM 40 MG PO TBEC: 40.0000 mg | DELAYED_RELEASE_TABLET | ORAL | 2 refills | Status: DC

## 2017-03-09 ENCOUNTER — Encounter: Payer: Self-pay | Admitting: Gastroenterology

## 2017-03-16 ENCOUNTER — Other Ambulatory Visit: Payer: Self-pay | Admitting: Family

## 2017-03-16 DIAGNOSIS — C679 Malignant neoplasm of bladder, unspecified: Secondary | ICD-10-CM

## 2017-03-17 ENCOUNTER — Ambulatory Visit (HOSPITAL_BASED_OUTPATIENT_CLINIC_OR_DEPARTMENT_OTHER): Payer: Medicare Other | Admitting: Family

## 2017-03-17 ENCOUNTER — Encounter: Payer: Self-pay | Admitting: Family

## 2017-03-17 ENCOUNTER — Other Ambulatory Visit: Payer: Medicare Other

## 2017-03-17 DIAGNOSIS — E119 Type 2 diabetes mellitus without complications: Secondary | ICD-10-CM

## 2017-03-17 DIAGNOSIS — Z85828 Personal history of other malignant neoplasm of skin: Secondary | ICD-10-CM | POA: Diagnosis not present

## 2017-03-17 DIAGNOSIS — C651 Malignant neoplasm of right renal pelvis: Secondary | ICD-10-CM

## 2017-03-17 DIAGNOSIS — Z905 Acquired absence of kidney: Secondary | ICD-10-CM | POA: Diagnosis not present

## 2017-03-17 DIAGNOSIS — Z7189 Other specified counseling: Secondary | ICD-10-CM

## 2017-03-17 DIAGNOSIS — Z87898 Personal history of other specified conditions: Secondary | ICD-10-CM

## 2017-03-17 DIAGNOSIS — Z809 Family history of malignant neoplasm, unspecified: Secondary | ICD-10-CM | POA: Diagnosis not present

## 2017-03-17 DIAGNOSIS — C641 Malignant neoplasm of right kidney, except renal pelvis: Secondary | ICD-10-CM

## 2017-03-17 DIAGNOSIS — Z8052 Family history of malignant neoplasm of bladder: Secondary | ICD-10-CM

## 2017-03-17 DIAGNOSIS — Z87891 Personal history of nicotine dependence: Secondary | ICD-10-CM

## 2017-03-17 DIAGNOSIS — C679 Malignant neoplasm of bladder, unspecified: Secondary | ICD-10-CM

## 2017-03-17 LAB — CBC WITH DIFFERENTIAL (CANCER CENTER ONLY)
BASO#: 0 10*3/uL (ref 0.0–0.2)
BASO%: 0.5 % (ref 0.0–2.0)
EOS%: 8.2 % — AB (ref 0.0–7.0)
Eosinophils Absolute: 0.3 10*3/uL (ref 0.0–0.5)
HEMATOCRIT: 37.3 % — AB (ref 38.7–49.9)
HGB: 11.9 g/dL — ABNORMAL LOW (ref 13.0–17.1)
LYMPH#: 1.7 10*3/uL (ref 0.9–3.3)
LYMPH%: 40.8 % (ref 14.0–48.0)
MCH: 26.8 pg — ABNORMAL LOW (ref 28.0–33.4)
MCHC: 31.9 g/dL — AB (ref 32.0–35.9)
MCV: 84 fL (ref 82–98)
MONO#: 0.5 10*3/uL (ref 0.1–0.9)
MONO%: 12 % (ref 0.0–13.0)
NEUT%: 38.5 % — AB (ref 40.0–80.0)
NEUTROS ABS: 1.6 10*3/uL (ref 1.5–6.5)
Platelets: 171 10*3/uL (ref 145–400)
RBC: 4.44 10*6/uL (ref 4.20–5.70)
RDW: 14.6 % (ref 11.1–15.7)
WBC: 4.2 10*3/uL (ref 4.0–10.0)

## 2017-03-17 LAB — CMP (CANCER CENTER ONLY)
ALBUMIN: 3.7 g/dL (ref 3.3–5.5)
ALT(SGPT): 48 U/L — ABNORMAL HIGH (ref 10–47)
AST: 29 U/L (ref 11–38)
Alkaline Phosphatase: 67 U/L (ref 26–84)
BILIRUBIN TOTAL: 0.6 mg/dL (ref 0.20–1.60)
BUN, Bld: 17 mg/dL (ref 7–22)
CALCIUM: 9.1 mg/dL (ref 8.0–10.3)
CHLORIDE: 104 meq/L (ref 98–108)
CO2: 26 meq/L (ref 18–33)
Creat: 1.6 mg/dl — ABNORMAL HIGH (ref 0.6–1.2)
GLUCOSE: 249 mg/dL — AB (ref 73–118)
POTASSIUM: 4.9 meq/L — AB (ref 3.3–4.7)
Sodium: 143 mEq/L (ref 128–145)
Total Protein: 7 g/dL (ref 6.4–8.1)

## 2017-03-17 LAB — LACTATE DEHYDROGENASE: LDH: 159 U/L (ref 125–245)

## 2017-03-17 NOTE — Progress Notes (Signed)
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 is a very pleasant 61 yo caucasian gentleman with infiltrating high grade papillary urothelial carcinoma with right nephroureterectomy on 02/12/2017 with Dr. Louis Meckel. He is still a little tender in the right flank after surgery. Incisions, lap sites and drain site have healed nicely. No redness, edema or discharge indicative of infection.  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 He has since had multiple surgeries with recurrences in the bladder, right ureter and most recently his right kidney. Both his father and paternal uncle passed away with metastatic bladder cancer earlier this year. He also had a maternal uncle with an unknown primary.  He has had several basal cell skin cancers removed from his back and arms.  He has never had a PET scan.  He is a type 2 diabetic and his blood sugars are not well controlled. His last Hgb A1c was 8.5. He has history of a heart attack and does take a baby aspirin daily.  He has had no issue with fever, chills, cough, rash, dizziness, SOB, chest pain, palpitations or changes in bowel habits. He does have frequent urination which he contributes to drinking lots of fluids.  He has occasional nausea, no vomiting.  He was a 1 ppd smoker and quit in 2009. He has an alcoholic beverage once in a while socially. He has occasional puffiness in his feet and ankles that comes and goes. No tenderness in his extremities. He has tingling in the right thigh that comes  and goes and sounds to be positional.  He has maintained a good appetite and is staying well hydrated. His weight is stable.  He is a retired [employer]. He is still recuperating from his surgery in October and has not started exercising much yet. He does enjoy tinkering in his garage.   ROS: All other 10 point review of systems is negative.   PAST MEDICAL HISTORY:   Past Medical History:  Diagnosis Date  . Arthritis   . Bladder cancer [facility]) followed by dr Risa Grill  . Coronary artery disease    followed by Dr. [name]; Lexiscan Myoview (10/14):  Low risk, EF 57%, small inf infarct  . Coronary vasospasm ([place]) PER DR ROSS NOTE 03-22-2012--  INTER. TIGHTNESS   PER PT PROBABLE FROM STRESS  . Depression    no rx  . Diabetes mellitus    followed by Dr. Dwyane Dee  . Dyslipidemia    pt unable to tolerate niaspan  . GERD (gastroesophageal reflux disease)   . History of angina CHRONIC -- CONTROLLED W/ IMDUR  . 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 History of non-ST elevation myocardial infarction (NSTEMI) 2005  . Hypertension   . Myocardial infarction ([place])   . OSA on CPAP   . Peripheral neuropathy RIGHT LEG NUMBNESS  . Pneumonia     ALLERGIES: Allergies  Allergen Reactions  . Dilaudid [Hydromorphone] Nausea And Vomiting  . Hydrocodone Itching and Nausea And Vomiting      MEDICATIONS:  Current Outpatient Medications on File Prior to Visit  Medication Sig Dispense Refill  . amLODipine (NORVASC) 10 MG tablet Take 1 tablet (10 mg total) by mouth daily. Hold for SBP < 110 (Patient taking differently: Take 10 mg by mouth at bedtime. Hold for SBP < 110) 90 tablet 3  . canagliflozin (INVOKANA) 300 MG TABS tablet Take 1 tablet (300 mg total) by mouth daily. (Patient taking differently: Take 150 mg by mouth daily before breakfast. ) 90 tablet 1  . docusate sodium (COLACE) 100 MG capsule Take 1 capsule (100 mg total) by mouth 2 (two) times daily as  needed (take to keep stool soft.). 60 capsule 0  . glimepiride (AMARYL) 2 MG tablet TAKE 1 BY MOUTH DAILY BEFORE SUPPER (Patient taking differently: Take 2 mg by mouth daily before supper. ) 90 tablet 1  . isosorbide mononitrate (IMDUR) 120 MG 24 hr tablet Take 1 tablet (120 mg total) by mouth daily. 90 tablet 2  . metFORMIN (GLUCOPHAGE) 1000 MG tablet Take 1 tablet (1,000 mg total) by mouth 2 (two) times daily with a meal. 180 tablet 1  . metoprolol succinate (TOPROL-XL) 25 MG 24 hr tablet Take 25 mg by mouth at bedtime.    . nitroGLYCERIN (NITROSTAT) 0.4 MG SL tablet Place 1 tablet (0.4 mg total) under the tongue every 5 (five) minutes as needed for chest pain. 25 tablet 3  . oxyCODONE 10 MG TABS Take 0.5-1 tablets (5-10 mg total) by mouth every 4 (four) hours as needed for moderate pain. (Patient not taking: Reported on 03/17/2017) 30 tablet 0  . pantoprazole (PROTONIX) 40 MG tablet Take 1 tablet (40 mg total) by mouth every morning. 90 tablet 2  . rOPINIRole (REQUIP) 2 MG tablet Take 1 tablet (2 mg total) by mouth at bedtime. 90 tablet 3  . rosuvastatin (CRESTOR) 10 MG tablet TAKE 1 TAB (10 MG) BY MOUTH EVERY OTHER DAY (Patient taking differently: Take 10 mg by mouth every other day. In the morning.) 90 tablet 3  . tamsulosin (FLOMAX) 0.4 MG CAPS capsule Take 0.4 mg by mouth.     No current facility-administered medications on file prior to visit.      PAST SURGICAL HISTORY Past Surgical History:  Procedure Laterality Date  . APPENDECTOMY  08-03-2004  . CARDIAC CATHETERIZATION  x3  last one 06-17-2007   MILD NON-OBSTRUCTIVE CAD/ NORMAL LVF/ 30% LEFT RENAL ARTERY STENOSIS  . CYSTO/ BILATERAL RETROGRADE PYELOGRAM/ RIGHT URETEROSCOPIC LASER FULGURATION URETERAL TUMOR  02-16-2008  . CYSTO/ BLADDER BX  09-13-2008;  08-13-2007; 04-19-2007; 06-01-2006  . CYSTO/ CYSTOGRAM/ URETEROSCOPY  02-21-2009  . CYSTO/ RESECTION BLADDER TUMOR/ LEFT RETROGRADE PYELOGRAM/ RIGHT URETEROSCOPY  08-07-2010   TCC  OF BLADDER   S/P DISTAL URETERECTOMY/ REIMPLANTATION  . CYSTO/ RIGHT URETEROSCOPY/ BX URETERAL TUMOR  10-11-2008  . CYSTOSCOPY WITH BIOPSY  04/07/2011   Procedure: CYSTOSCOPY WITH BIOPSY/ RIGHT URETEROSCOPY;  Surgeon: Bernestine Amass, MD;  Location: [facility];  Service: Urology;  Laterality: N/A;    cystoscopy, cystogram, biopsy and fulgeration  . ELBOW SURGERY  07-07-2003   RIGHT ELBOW ARTHROSCOPY W/ OPEN RECONSTRUCTION  . KNEE ARTHROSCOPY W/ DEBRIDEMENT  02-08-2004   RIGHT KNEE  . RHINOPLASTY W/ MODIFIED CARTILAGE GRAFT  05-17-2007   INTERNAL NASAL VALVE COLLAPSE/ OSA/ SEPTAL PERFERATION  . RIGHT DISTAL URETERECTOMY W/ REIMPLANTATION  10-30-2008   TCC OF RIGHT DISTAL URETER  . SHOULDER SURGERY  2005   RIGHT ROTATOR CUFF REPAIR  . TOTAL KNEE ARTHROPLASTY Left 05/18/2015  .  TRANSURETHRAL RESECTION OF BLADDER TUMOR  01-13-2007  . umbillical hernia repair  2004    FAMILY HISTORY: Family History  Problem Relation Age of Onset  . Coronary artery disease Father   . Diabetes Maternal Grandmother   . Heart disease Paternal Grandfather   . Colon cancer Neg Hx   . Stomach cancer Neg Hx     SOCIAL HISTORY:  reports that he quit smoking about 9 years ago. His smoking use included cigarettes. He has a 10.00 pack-year smoking history. he has never used smokeless tobacco. He reports that he drinks alcohol. He reports that he does not use drugs.  PERFORMANCE STATUS: The patient's performance status is 1 - Symptomatic but completely ambulatory  PHYSICAL EXAM: Most Recent Vital Signs: There were no vitals taken for this visit. There were no vitals taken for this visit.  General Appearance:    Alert, cooperative, no distress, appears stated age  Head:    Normocephalic, without obvious abnormality, atraumatic  Eyes:    PERRL, conjunctiva/corneas clear, EOM's intact, fundi    benign, both eyes             Throat:   Lips, mucosa, and tongue normal; teeth and gums normal   Neck:   Supple, symmetrical, trachea midline, no adenopathy;       thyroid:  No enlargement/tenderness/nodules; no carotid   bruit or JVD  Back:     Symmetric, no curvature, ROM normal, no CVA tenderness  Lungs:     Clear to auscultation bilaterally, respirations unlabored  Chest wall:    No tenderness or deformity  Heart:    Regular rate and rhythm, S1 and S2 normal, no murmur, rub   or gallop  Abdomen:     Soft, mildly tender on palpation of right flank, multiple incisions and lap sites have healed nicely, bowel sounds active all four quadrants, no masses, no organomegaly        Extremities:   Extremities normal, atraumatic, no cyanosis or edema  Pulses:   2+ and symmetric all extremities  Skin:   Skin color, texture, turgor normal, no rashes or lesions  Lymph nodes:   Cervical, supraclavicular, and axillary nodes normal  Neurologic:   CNII-XII intact. Normal strength, sensation and reflexes      throughout    LABORATORY DATA:  Results for orders placed or performed in visit on 03/17/17 (from the past 48 hour(s))  CMP STAT     Status: Abnormal   Collection Time: 03/17/17 10:45 AM  Result Value Ref Range   Sodium 143 128 - 145 mEq/L   Potassium 4.9 (H) 3.3 - 4.7 mEq/L   Chloride 104 98 - 108 mEq/L   CO2 26 18 - 33 mEq/L   Glucose, Bld 249 (H) 73 - 118 mg/dL   BUN, Bld 17 7 - 22 mg/dL   Creat 1.6 (H) 0.6 - 1.2 mg/dl   Total Bilirubin 0.60 0.20 - 1.60 mg/dl   Alkaline Phosphatase 67 26 - 84 U/L   AST 29 11 - 38 U/L   ALT(SGPT) 48 (H) 10 - 47 U/L   Total Protein 7.0 6.4 - 8.1 g/dL   Albumin 3.7 3.3 - 5.5 g/dL   Calcium 9.1 8.0 - 10.3 mg/dL  CBC w/Diff     Status: Abnormal   Collection Time: 03/17/17 10:45 AM  Result Value Ref Range   WBC 4.2 4.0 - 10.0 10e3/uL   RBC 4.44 4.20 - 5.70 10e6/uL   HGB 11.9 (L) 13.0 - 17.1  g/dL   HCT 37.3 (L) 38.7 - 49.9 %   MCV 84 82 - 98 fL   MCH 26.8 (L) 28.0 - 33.4 pg   MCHC 31.9 (L) 32.0 - 35.9 g/dL   RDW 14.6 11.1 - 15.7 %   Platelets  171 145 - 400 10e3/uL   NEUT# 1.6 1.5 - 6.5 10e3/uL   LYMPH# 1.7 0.9 - 3.3 10e3/uL   MONO# 0.5 0.1 - 0.9 10e3/uL   Eosinophils Absolute 0.3 0.0 - 0.5 10e3/uL   BASO# 0.0 0.0 - 0.2 10e3/uL   NEUT% 38.5 (L) 40.0 - 80.0 %   LYMPH% 40.8 14.0 - 48.0 %   MONO% 12.0 0.0 - 13.0 %   EOS% 8.2 (H) 0.0 - 7.0 %   BASO% 0.5 0.0 - 2.0 %      RADIOGRAPHY: No results found.     PATHOLOGY: On 02/12/2017 with right nephroureterectomy... Kidney, radical nephrectomy for tumor, right and ureter INFILTRATING HIGH GRADE PAPILLARY UROTHELIAL CARCINOMA (5.5 CM) THE TUMOR INVADES INTO THE RENAL PARENCHYMA (PT3) LYMPHOVASCULAR INVASION IS IDENTIFIED ALL MARGINS OF RESECTION ARE NEGATIVE FOR CARCINOMA URETER: UROTHELIUM WITH DYPLASIA AND POLYPOID URETERITIS  ASSESSMENT/PLAN: [name] is a very pleasant 61 yo caucasian gentleman with infiltrating high grade papillary urothelial carcinoma with right nephroureterectomy on 02/12/2017. He has a strong family history of bladder cancer as well.  He has recuperated from his surgery nicely but is still a little tender in the right flank.  We will get him scheduled for a PET scan later this week to confirm staging.  We spoke with him in detail about starting chemo including risks and benefits. He was given educational material on Gemzar and Cisplatin and we will get him scheduled for chemo class.  He will have his port placed on November 23rd and we will plan to start treatment that following week.   All questions were answered and he is in agreement with the plan. He will contact our office with any questions or concerns. We can certainly see him much sooner if necessary.  He was discussed with and also seen by Dr. [name] and he is in agreement with the aforementioned.   [facility] M   Addendum: I saw and examined the patient with Sarah.  This is a little bit of a complicated situation.  The remarkable thing is that he has a strong family history of bladder  cancer.  I am also troubled by his pathology report.  The pathology report ([id number]) shows lymphovascular invasion.  This is a high-grade tumor.  There is no extension to the renal parenchyma.  The tumor measured 5.5 cm.  I believe that he is a candidate for adjuvant chemotherapy.  I really believe that he has a significant risk of recurrence.  He is young.  He is in good shape.  I think he would benefit from systemic adjuvant chemotherapy.  We spent a good hour talking to he and his mom.  They are both very nice.  I reviewed the pathology report with him and explained why I thought that he would benefit.  He is in agreement.  I would use cis-platinum/gemcitabine.  I realize that he only has one kidney but I think that this would be considered the standard of care for urothelial carcinoma.  He will need to have a Port-A-Cath.  I probably would give him 6 cycles of treatment.  I would like to get a PET scan on him so we can see that there is no obvious  metastatic disease.  I gave him some information sheets about each medication.  I went over the side effects.  I explained to them that he might need a transfusion.  I explained that his white cell count and  red cell count might go down.  I would like to get started the week after Thanksgiving.  I think this would be reasonable.  We will plan to see him back the first day of his second cycle of treatment.  Lattie Haw, MD

## 2017-03-18 ENCOUNTER — Encounter: Payer: Self-pay | Admitting: Family

## 2017-03-18 DIAGNOSIS — C651 Malignant neoplasm of right renal pelvis: Secondary | ICD-10-CM

## 2017-03-18 DIAGNOSIS — Z7189 Other specified counseling: Secondary | ICD-10-CM | POA: Insufficient documentation

## 2017-03-18 HISTORY — DX: Malignant neoplasm of right renal pelvis: C65.1

## 2017-03-18 NOTE — Progress Notes (Signed)
START ON PATHWAY REGIMEN - Bladder     A cycle is every 21 days:     Gemcitabine      Cisplatin   **Always confirm dose/schedule in your pharmacy ordering system**    Patient Characteristics: Post Cystectomy, T3-T4a, N0, M0, No Previous Neoadjuvant Chemotherapy AJCC M Category: M0 AJCC N Category: NX AJCC T Category: T3 Current evidence of distant metastases<= No AJCC 8 Stage Grouping: Unknown Intent of Therapy: Curative Intent, Discussed with Patient

## 2017-03-19 NOTE — Progress Notes (Signed)
Thanks. He is a nice man.

## 2017-03-20 ENCOUNTER — Telehealth: Payer: Self-pay | Admitting: Internal Medicine

## 2017-03-20 MED ORDER — METOPROLOL SUCCINATE ER 25 MG PO TB24: 25.0000 mg | ORAL_TABLET | Freq: Every day | ORAL | 3 refills | Status: DC

## 2017-03-20 NOTE — Telephone Encounter (Signed)
New message    Pt c/o medication issue:  1. Name of Medication: metoprolol   2. How are you currently taking this medication (dosage and times per day)? 50 mg  3. Are you having a reaction (difficulty breathing--STAT)? no  4. What is your medication issue? Pharmacy is calling for clarification on directions.

## 2017-03-20 NOTE — Telephone Encounter (Signed)
Saw oncologist this week.  Starting chemo later this month--for 4 months..  Getting iv catheter (port) and PET scan first.  Confirmed with him he is taking metoprolol 25 mg daily.  Refilled to pharmacy.  He is due to return to cardiology next summer.  Pt wanted to be sure Dr. Harrington Challenger is aware and kept up to date.

## 2017-03-22 NOTE — Telephone Encounter (Signed)
Set pt to see me back  End of jan / Feb to make sure OK from [facility]'

## 2017-03-25 ENCOUNTER — Encounter: Payer: Self-pay | Admitting: *Deleted

## 2017-03-25 DIAGNOSIS — E119 Type 2 diabetes mellitus without complications: Secondary | ICD-10-CM | POA: Diagnosis not present

## 2017-03-25 DIAGNOSIS — Z794 Long term (current) use of insulin: Secondary | ICD-10-CM | POA: Diagnosis not present

## 2017-03-25 NOTE — Telephone Encounter (Signed)
Called patient back.  He would prefer to wait until he is finished with chemotherapy before scheduling his f/u in cardiology.  I have scheduled him for 07/30/17 with Dr. Harrington Challenger and advised him to call in the meantime with any questions or concerns.

## 2017-03-26 ENCOUNTER — Other Ambulatory Visit: Payer: Self-pay | Admitting: *Deleted

## 2017-03-26 ENCOUNTER — Ambulatory Visit (HOSPITAL_COMMUNITY)
Admission: RE | Admit: 2017-03-26 | Discharge: 2017-03-26 | Disposition: A | Discharge status: Home / Self Care | Payer: Medicare Other | Source: Ambulatory Visit | Attending: Family | Admitting: Family

## 2017-03-26 ENCOUNTER — Other Ambulatory Visit: Payer: Medicare Other

## 2017-03-26 ENCOUNTER — Telehealth: Payer: Self-pay

## 2017-03-26 ENCOUNTER — Encounter: Payer: Self-pay | Admitting: *Deleted

## 2017-03-26 DIAGNOSIS — C679 Malignant neoplasm of bladder, unspecified: Secondary | ICD-10-CM | POA: Diagnosis not present

## 2017-03-26 DIAGNOSIS — E119 Type 2 diabetes mellitus without complications: Secondary | ICD-10-CM | POA: Diagnosis not present

## 2017-03-26 DIAGNOSIS — C651 Malignant neoplasm of right renal pelvis: Secondary | ICD-10-CM

## 2017-03-26 DIAGNOSIS — C641 Malignant neoplasm of right kidney, except renal pelvis: Secondary | ICD-10-CM | POA: Insufficient documentation

## 2017-03-26 LAB — GLUCOSE, CAPILLARY: Glucose-Capillary: 206 mg/dL — ABNORMAL HIGH (ref 65–99)

## 2017-03-26 MED ORDER — PROCHLORPERAZINE MALEATE 10 MG PO TABS: 10.0000 mg | ORAL_TABLET | Freq: Four times a day (QID) | ORAL | 1 refills | Status: DC | PRN

## 2017-03-26 MED ORDER — LIDOCAINE-PRILOCAINE 2.5-2.5 % EX CREA: TOPICAL_CREAM | CUTANEOUS | 3 refills | Status: DC

## 2017-03-26 MED ORDER — FLUDEOXYGLUCOSE F - 18 (FDG) INJECTION
10.2000 | Freq: Once | INTRAVENOUS | Status: AC | PRN
  Administered 2017-03-26: 10.2 via INTRAVENOUS

## 2017-03-26 MED ORDER — LORAZEPAM 0.5 MG PO TABS: 0.5000 mg | ORAL_TABLET | Freq: Four times a day (QID) | ORAL | 0 refills | Status: DC | PRN

## 2017-03-26 MED ORDER — DEXAMETHASONE 4 MG PO TABS: ORAL_TABLET | ORAL | 1 refills | Status: DC

## 2017-03-26 MED ORDER — ONDANSETRON HCL 8 MG PO TABS: 8.0000 mg | ORAL_TABLET | Freq: Two times a day (BID) | ORAL | 1 refills | Status: DC | PRN

## 2017-03-26 NOTE — Telephone Encounter (Signed)
Chelsea with [facility] called that approval for ondasetron, emla cream and lorazepam has been given for 1 yr, until 03-26-2018.   [facility] # [phone] opt 5

## 2017-03-30 ENCOUNTER — Telehealth: Payer: Self-pay | Admitting: Hematology & Oncology

## 2017-03-30 NOTE — Telephone Encounter (Signed)
lvm to inform pt of 11/26 and 11/27 appts per LOS

## 2017-03-31 ENCOUNTER — Other Ambulatory Visit: Payer: Self-pay | Admitting: Radiology

## 2017-04-01 ENCOUNTER — Other Ambulatory Visit: Payer: Self-pay | Admitting: Family

## 2017-04-01 ENCOUNTER — Encounter (HOSPITAL_COMMUNITY): Payer: Self-pay

## 2017-04-01 ENCOUNTER — Ambulatory Visit (HOSPITAL_COMMUNITY)
Admission: RE | Admit: 2017-04-01 | Discharge: 2017-04-01 | Disposition: A | Discharge status: Home / Self Care | Payer: Medicare Other | Source: Ambulatory Visit | Attending: Family | Admitting: Family

## 2017-04-01 DIAGNOSIS — I10 Essential (primary) hypertension: Secondary | ICD-10-CM | POA: Insufficient documentation

## 2017-04-01 DIAGNOSIS — C679 Malignant neoplasm of bladder, unspecified: Secondary | ICD-10-CM | POA: Insufficient documentation

## 2017-04-01 DIAGNOSIS — G4733 Obstructive sleep apnea (adult) (pediatric): Secondary | ICD-10-CM | POA: Diagnosis not present

## 2017-04-01 DIAGNOSIS — Z885 Allergy status to narcotic agent status: Secondary | ICD-10-CM | POA: Insufficient documentation

## 2017-04-01 DIAGNOSIS — E1142 Type 2 diabetes mellitus with diabetic polyneuropathy: Secondary | ICD-10-CM | POA: Diagnosis not present

## 2017-04-01 DIAGNOSIS — C641 Malignant neoplasm of right kidney, except renal pelvis: Secondary | ICD-10-CM

## 2017-04-01 DIAGNOSIS — F329 Major depressive disorder, single episode, unspecified: Secondary | ICD-10-CM | POA: Insufficient documentation

## 2017-04-01 DIAGNOSIS — Z7984 Long term (current) use of oral hypoglycemic drugs: Secondary | ICD-10-CM | POA: Diagnosis not present

## 2017-04-01 DIAGNOSIS — Z905 Acquired absence of kidney: Secondary | ICD-10-CM | POA: Insufficient documentation

## 2017-04-01 DIAGNOSIS — I2510 Atherosclerotic heart disease of native coronary artery without angina pectoris: Secondary | ICD-10-CM | POA: Diagnosis not present

## 2017-04-01 DIAGNOSIS — I252 Old myocardial infarction: Secondary | ICD-10-CM | POA: Diagnosis not present

## 2017-04-01 DIAGNOSIS — E785 Hyperlipidemia, unspecified: Secondary | ICD-10-CM | POA: Diagnosis not present

## 2017-04-01 DIAGNOSIS — Z8551 Personal history of malignant neoplasm of bladder: Secondary | ICD-10-CM | POA: Diagnosis not present

## 2017-04-01 DIAGNOSIS — K219 Gastro-esophageal reflux disease without esophagitis: Secondary | ICD-10-CM | POA: Diagnosis not present

## 2017-04-01 DIAGNOSIS — Z87891 Personal history of nicotine dependence: Secondary | ICD-10-CM | POA: Diagnosis not present

## 2017-04-01 DIAGNOSIS — Z5111 Encounter for antineoplastic chemotherapy: Secondary | ICD-10-CM | POA: Diagnosis not present

## 2017-04-01 DIAGNOSIS — M1990 Unspecified osteoarthritis, unspecified site: Secondary | ICD-10-CM | POA: Diagnosis not present

## 2017-04-01 HISTORY — PX: IR US GUIDE VASC ACCESS RIGHT: IMG2390

## 2017-04-01 HISTORY — PX: IR FLUORO GUIDE PORT INSERTION RIGHT: IMG5741

## 2017-04-01 LAB — BASIC METABOLIC PANEL
Anion gap: 10 (ref 5–15)
BUN: 20 mg/dL (ref 6–20)
CHLORIDE: 105 mmol/L (ref 101–111)
CO2: 22 mmol/L (ref 22–32)
Calcium: 9.4 mg/dL (ref 8.9–10.3)
Creatinine, Ser: 1.42 mg/dL — ABNORMAL HIGH (ref 0.61–1.24)
GFR calc Af Amer: >60 mL/min (ref >60)
GFR calc non Af Amer: 52 mL/min — ABNORMAL LOW (ref >60)
GLUCOSE: 164 mg/dL — AB (ref 65–99)
POTASSIUM: 4.4 mmol/L (ref 3.5–5.1)
Sodium: 137 mmol/L (ref 135–145)

## 2017-04-01 LAB — CBC WITH DIFFERENTIAL/PLATELET
Basophils Absolute: 0 10*3/uL (ref 0.0–0.1)
Basophils Relative: 1 %
EOS PCT: 4 %
Eosinophils Absolute: 0.2 10*3/uL (ref 0.0–0.7)
HCT: 36.2 % — ABNORMAL LOW (ref 39.0–52.0)
Hemoglobin: 11.7 g/dL — ABNORMAL LOW (ref 13.0–17.0)
LYMPHS ABS: 1.5 10*3/uL (ref 0.7–4.0)
LYMPHS PCT: 39 %
MCH: 26.1 pg (ref 26.0–34.0)
MCHC: 32.3 g/dL (ref 30.0–36.0)
MCV: 80.6 fL (ref 78.0–100.0)
MONO ABS: 0.5 10*3/uL (ref 0.1–1.0)
Monocytes Relative: 14 %
Neutro Abs: 1.6 10*3/uL — ABNORMAL LOW (ref 1.7–7.7)
Neutrophils Relative %: 42 %
PLATELETS: 188 10*3/uL (ref 150–400)
RBC: 4.49 MIL/uL (ref 4.22–5.81)
RDW: 15 % (ref 11.5–15.5)
WBC: 3.8 10*3/uL — ABNORMAL LOW (ref 4.0–10.5)

## 2017-04-01 LAB — PROTIME-INR
INR: 1.03
Prothrombin Time: 13.4 seconds (ref 11.4–15.2)

## 2017-04-01 LAB — GLUCOSE, CAPILLARY: GLUCOSE-CAPILLARY: 159 mg/dL — AB (ref 65–99)

## 2017-04-01 MED ORDER — FENTANYL CITRATE (PF) 100 MCG/2ML IJ SOLN
INTRAMUSCULAR | Status: AC
  Filled 2017-04-01: qty 4

## 2017-04-01 MED ORDER — MIDAZOLAM HCL 2 MG/2ML IJ SOLN
INTRAMUSCULAR | Status: AC | PRN
  Administered 2017-04-01 (×3): 1 mg via INTRAVENOUS

## 2017-04-01 MED ORDER — FENTANYL CITRATE (PF) 100 MCG/2ML IJ SOLN
INTRAMUSCULAR | Status: AC | PRN
  Administered 2017-04-01 (×2): 50 ug via INTRAVENOUS

## 2017-04-01 MED ORDER — MIDAZOLAM HCL 2 MG/2ML IJ SOLN
INTRAMUSCULAR | Status: AC
  Filled 2017-04-01: qty 4

## 2017-04-01 MED ORDER — LIDOCAINE-EPINEPHRINE (PF) 2 %-1:200000 IJ SOLN
INTRAMUSCULAR | Status: AC | PRN
  Administered 2017-04-01: 20 mL

## 2017-04-01 MED ORDER — LIDOCAINE-EPINEPHRINE (PF) 2 %-1:200000 IJ SOLN
INTRAMUSCULAR | Status: AC
  Filled 2017-04-01: qty 20

## 2017-04-01 MED ORDER — SODIUM CHLORIDE 0.9 % IV SOLN
INTRAVENOUS | Status: DC
Start: 2017-04-01 — End: 2017-04-02
  Administered 2017-04-01: 12:00:00 via INTRAVENOUS

## 2017-04-01 MED ORDER — CEFAZOLIN SODIUM-DEXTROSE 2-4 GM/100ML-% IV SOLN
2.0000 g | INTRAVENOUS | Status: AC
  Administered 2017-04-01: 2 g via INTRAVENOUS

## 2017-04-01 MED ORDER — HEPARIN SOD (PORK) LOCK FLUSH 100 UNIT/ML IV SOLN
INTRAVENOUS | Status: AC
  Filled 2017-04-01: qty 5

## 2017-04-01 MED ORDER — CEFAZOLIN SODIUM-DEXTROSE 2-4 GM/100ML-% IV SOLN
INTRAVENOUS | Status: AC
  Filled 2017-04-01: qty 100

## 2017-04-01 NOTE — Consult Note (Signed)
Chief Complaint: Patient was seen in consultation today for Port-A-Cath placement  Referring Physician(s): [place],Sarah M/Ennever,P  Supervising Physician: Sandi Mariscal  Patient Status: [name]  History of Present Illness: Isaac [name] is a 61 y.o. male with prior history of bladder cancer/TURBT and now with stage III high-grade papillary urothelial carcinoma of the right renal hilum, status post nephroureterectomy on 02/12/17.  He had  lymphovascular invasion on pathology.  He presents today for Port-A-Cath placement for planned chemotherapy.  Past Medical History:  Diagnosis Date  . Arthritis   . Bladder cancer [facility]) followed by dr Risa Grill  . Cancer of renal pelvis, right ([place]) 03/18/2017  . Coronary artery disease    followed by Dr. [name]; Lexiscan Myoview (10/14):  Low risk, EF 57%, small inf infarct  . Coronary vasospasm ([place]) PER DR ROSS NOTE 03-22-2012--  INTER. TIGHTNESS   PER PT PROBABLE FROM STRESS  . Depression    no rx  . Diabetes mellitus    followed by Dr. Dwyane Dee  . Dyslipidemia    pt unable to tolerate niaspan  . GERD (gastroesophageal reflux disease)   . Goals of care, counseling/discussion 03/18/2017  . History of angina CHRONIC -- CONTROLLED W/ IMDUR  . 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 History of non-ST elevation myocardial infarction (NSTEMI) 2005  . Hypertension   . Myocardial infarction ([place])   . OSA on CPAP   . Peripheral neuropathy RIGHT LEG NUMBNESS  . Pneumonia     Past Surgical History:  Procedure Laterality Date  . APPENDECTOMY  08-03-2004  . CARDIAC CATHETERIZATION  x3  last one 06-17-2007   MILD NON-OBSTRUCTIVE CAD/ NORMAL LVF/ 30% LEFT RENAL ARTERY STENOSIS  . CYSTO/ BILATERAL RETROGRADE PYELOGRAM/ RIGHT URETEROSCOPIC LASER FULGURATION URETERAL TUMOR  02-16-2008  . CYSTO/ BLADDER BX  09-13-2008;  08-13-2007; 04-19-2007; 06-01-2006  . CYSTO/ CYSTOGRAM/ URETEROSCOPY   02-21-2009  . CYSTO/ RESECTION BLADDER TUMOR/ LEFT RETROGRADE PYELOGRAM/ RIGHT URETEROSCOPY  08-07-2010   TCC OF BLADDER   S/P DISTAL URETERECTOMY/ REIMPLANTATION  . CYSTO/ RIGHT URETEROSCOPY/ BX URETERAL TUMOR  10-11-2008  . CYSTOSCOPY W/ RETROGRADES  04/19/2012   Procedure: CYSTOSCOPY WITH RETROGRADE PYELOGRAM;  Surgeon: Bernestine Amass, MD;  Location: [facility];  Service: Urology;  Laterality: Bilateral;  30 MINS Cysto, Biopsy, possible TURBT, Bilateral retrograde pyelograms with Mitomycin C instilation Post op   . CYSTOSCOPY WITH BIOPSY  04/07/2011   Procedure: CYSTOSCOPY WITH BIOPSY/ RIGHT URETEROSCOPY;  Surgeon: Bernestine Amass, MD;  Location: [facility];  Service: Urology;  Laterality: N/A;    cystoscopy, cystogram, biopsy and fulgeration  . CYSTOSCOPY WITH BIOPSY  12/29/2011   Procedure: CYSTOSCOPY WITH BIOPSY;  Surgeon: Bernestine Amass, MD;  Location: [facility];  Service: Urology;  Laterality: N/A;  [place]    . ELBOW SURGERY  07-07-2003   RIGHT ELBOW ARTHROSCOPY W/ OPEN RECONSTRUCTION  . KNEE ARTHROSCOPY W/ DEBRIDEMENT  02-08-2004   RIGHT KNEE  . RHINOPLASTY W/ MODIFIED CARTILAGE GRAFT  05-17-2007   INTERNAL NASAL VALVE COLLAPSE/ OSA/ SEPTAL PERFERATION  . RIGHT DISTAL URETERECTOMY W/ REIMPLANTATION  10-30-2008   TCC OF RIGHT DISTAL URETER  . ROBOT ASSITED LAPAROSCOPIC NEPHROURETERECTOMY Right 02/12/2017   Procedure: XI ROBOT ASSITED LAPAROSCOPIC NEPHROURETERECTOMY;  Surgeon: [name], MD;  Location: [facility];  Service: Urology;  Laterality: Right;  . SHOULDER SURGERY  2005   RIGHT ROTATOR CUFF REPAIR  .  TOTAL KNEE ARTHROPLASTY Left 05/18/2015  . TOTAL KNEE ARTHROPLASTY Left 05/18/2015   Procedure: TOTAL KNEE ARTHROPLASTY;  Surgeon: Earlie Server, MD;  Location: [place];  Service: Orthopedics;  Laterality: Left;  . TOTAL KNEE ARTHROPLASTY Right 03/21/2016   Procedure: TOTAL KNEE ARTHROPLASTY;  Surgeon: Earlie Server, MD;  Location: [place];  Service: Orthopedics;  Laterality: Right;  . TRANSURETHRAL RESECTION OF BLADDER TUMOR  01-13-2007  . TRANSURETHRAL RESECTION OF BLADDER TUMOR  04/19/2012   Procedure: TRANSURETHRAL RESECTION OF BLADDER TUMOR (TURBT);  Surgeon: Bernestine Amass, MD;  Location: [facility];  Service: Urology;  Laterality: N/A;  . umbillical hernia repair  2004    Allergies: Dilaudid [hydromorphone] and Hydrocodone  Medications: Prior to Admission medications   Medication Sig Start Date End Date Taking? Authorizing Provider  amLODipine (NORVASC) 10 MG tablet Take 1 tablet (10 mg total) by mouth daily. Hold for SBP < 110 Patient taking differently: Take 10 mg by mouth at bedtime. Hold for SBP < 110 03/31/16  Yes [name], MD  docusate sodium (COLACE) 100 MG capsule Take 1 capsule (100 mg total) by mouth 2 (two) times daily as needed (take to keep stool soft.). 02/13/17  Yes [name], MD  glimepiride (AMARYL) 2 MG tablet TAKE 1 BY MOUTH DAILY BEFORE SUPPER Patient taking differently: Take 2 mg by mouth daily before supper.  11/28/15  Yes Elayne Snare, MD  isosorbide mononitrate (IMDUR) 120 MG 24 hr tablet Take 1 tablet (120 mg total) by mouth daily. 03/06/17  Yes [name], MD  metFORMIN (GLUCOPHAGE) 1000 MG tablet Take 1 tablet (1,000 mg total) by mouth 2 (two) times daily with a meal. 12/25/14  Yes Elayne Snare, MD  metoprolol succinate (TOPROL-XL) 25 MG 24 hr tablet Take 1 tablet (25 mg total) at bedtime by mouth. 03/20/17  Yes [name], MD  ondansetron (ZOFRAN) 8 MG tablet Take 1 tablet (8 mg total) 2 (two) times daily as needed by mouth. Start on the third day after cisplatin chemotherapy. 03/26/17  Yes [name], MD  pantoprazole (PROTONIX) 40 MG tablet Take 1 tablet (40 mg total) by mouth every morning. 03/06/17  Yes [name], MD  rOPINIRole (REQUIP) 2 MG tablet Take 1 tablet (2 mg total) by mouth at bedtime. 10/23/16  Yes [name], [name], MD  rosuvastatin (CRESTOR) 10 MG tablet TAKE 1 TAB (10 MG) BY MOUTH EVERY OTHER DAY Patient taking differently: Take 10 mg by mouth every other day. In the morning. 03/12/16  Yes [name], MD  tamsulosin (FLOMAX) 0.4 MG CAPS capsule Take 0.4 mg by mouth.   Yes [provider]  canagliflozin (INVOKANA) 300 MG TABS tablet Take 1 tablet (300 mg total) by mouth daily. Patient taking differently: Take 150 mg by mouth daily before breakfast.  11/28/15   Elayne Snare, MD  dexamethasone (DECADRON) 4 MG tablet Take 2 tablets by mouth once a day on the day after cisplatin chemotherapy and then take 2 tablets two times a day for 2 days. Take with food. 03/26/17   [name], MD  lidocaine-prilocaine (EMLA) cream Apply to affected area once 03/26/17   Ennever, Rudell Cobb, MD  LORazepam (ATIVAN) 0.5 MG tablet Take 1 tablet (0.5 mg total) every 6 (six) hours as needed by mouth (Nausea or vomiting). 03/26/17   [name], MD  nitroGLYCERIN (NITROSTAT) 0.4 MG SL tablet Place 1 tablet (0.4 mg total) under the tongue every 5 (  five) minutes as needed for chest pain. 11/29/15   [name], MD  oxyCODONE 10 MG TABS Take 0.5-1 tablets (5-10 mg total) by mouth every 4 (four) hours as needed for moderate pain. Patient not taking: Reported on 03/17/2017 02/13/17   [name], MD  prochlorperazine (COMPAZINE) 10 MG tablet Take 1 tablet (10 mg total) every 6 (six) hours as needed by mouth (Nausea or vomiting). 03/26/17   [name], MD     Family History  Problem Relation Age of Onset  . Coronary artery disease Father   . Diabetes Maternal Grandmother   . Heart disease Paternal Grandfather   . Colon cancer Neg Hx   . Stomach cancer Neg Hx     Social History   Socioeconomic History  . Marital status: Single    Spouse name: None  . Number of children: None  . Years of education: None  . Highest education level: None  Social Needs  . Financial resource strain: None  . Food  insecurity - worry: None  . Food insecurity - inability: None  . Transportation needs - medical: None  . Transportation needs - non-medical: None  Occupational History  . None  Tobacco Use  . Smoking status: Former Smoker    Packs/day: 0.50    Years: 20.00    Pack years: 10.00    Types: Cigarettes    Last attempt to quit: 05/13/2007    Years since quitting: 9.8  . Smokeless tobacco: Never Used  Substance and Sexual Activity  . Alcohol use: Yes    Comment: social  . Drug use: No  . Sexual activity: None  Other Topics Concern  . None  Social History Narrative  . None      Review of Systems denies fever, chest pain, dyspnea, cough, nausea, vomiting or bleeding.  He does have minor headaches, right lower abdominal/flank discomfort  Vital Signs: BP 138/76 (BP Location: Right Arm)   Pulse 75   Temp (!) 97.4 F (36.3 C) (Oral)   Resp 16   Ht 5\' 9"  (1.753 m)   Wt 206 lb (93.4 kg)   SpO2 99%   BMI 30.42 kg/m   Physical Exam awake, alert.  chest clear to auscultation bilaterally.  Heart with regular rate and rhythm.  Abdomen soft, positive bowel sounds, mildly tender right lateral abdomen/flank region; lower extremities with trace edema bilaterally  Imaging: Nm Pet Image Initial (pi) Skull Base To Thigh  Result Date: 03/26/2017 CLINICAL DATA:  High grade papillary urothelial carcinoma status post right nephrectomy. Staging. EXAM: NUCLEAR MEDICINE PET SKULL BASE TO THIGH TECHNIQUE: 10.2 mCi F-18 FDG was injected intravenously. Full-ring PET imaging was performed from the skull base to thigh after the radiotracer. CT data was obtained and used for attenuation correction and anatomic localization. FASTING BLOOD GLUCOSE:  Value: 204 mg/dl COMPARISON:  December 30, 2016 FINDINGS: NECK: No hypermetabolic lymph nodes in the neck. CHEST: No hypermetabolic mediastinal or hilar nodes. No suspicious pulmonary nodules on the CT scan. ABDOMEN/PELVIS: Mild uptake in the right nephrectomy bed is  thought to be postsurgical. No nodules or masses identified. No abnormal FDG uptake. SKELETON: No focal hypermetabolic activity to suggest skeletal metastasis. IMPRESSION: No FDG avid malignancy identified on today's study. Electronically Signed   By: Dorise Bullion III M.D   On: 03/26/2017 13:16    Labs:  CBC: Recent Labs    02/12/17 1249 02/13/17 0420 02/15/17 0509 03/17/17 1045 04/01/17 1156  WBC 7.7  --  5.7 4.2 3.8*  HGB 10.8* 10.2* 11.5* 11.9* 11.7*  HCT 33.1* 32.0* 35.3* 37.3* 36.2*  PLT 197  --  205 171 188    COAGS: Recent Labs    04/01/17 1156  INR 1.03    BMP: Recent Labs    02/13/17 0420 02/14/17 0502 02/15/17 0509 03/17/17 1045 04/01/17 1156  NA 137 137 137 143 137  K 4.9 4.1 4.8 4.9* 4.4  CL 105 104 105 104 105  CO2 26 24 23 26 22   GLUCOSE 123* 153* 157* 249* 164*  BUN 13 18 20 17 20   CALCIUM 8.3* 8.1* 8.7* 9.1 9.4  CREATININE 1.46* 1.85* 1.60* 1.6* 1.42*  GFRNONAA 50* 38* 45*  --  52*  GFRAA 58* 44* 52*  --  >60    LIVER FUNCTION TESTS: Recent Labs    10/10/16 1043 02/05/17 1357 03/17/17 1045  BILITOT 0.4 0.4 0.60  AST 37* 50* 29  ALT 57* 63 48*  ALKPHOS 50 69 67  PROT 6.4 7.3 7.0  ALBUMIN 4.0 3.9 3.7    TUMOR MARKERS: No results for input(s): AFPTM, CEA, CA199, CHROMGRNA in the last 8760 hours.  Assessment and Plan: 61 y.o. male with prior history of bladder cancer/TURBT and now with stage III high-grade papillary urothelial carcinoma of the right renal hilum, status post nephroureterectomy on 02/12/17.  He had  lymphovascular invasion on pathology.  He presents today for Port-A-Cath placement for planned chemotherapy.Risks and benefits discussed with the patient including, but not limited to bleeding, infection, pneumothorax, or fibrin sheath development and need for additional procedures.All of the patient's questions were answered, patient is agreeable to proceed.Consent signed and in chart.      Thank you for this interesting  consult.  I greatly enjoyed meeting KOU GUCCIARDO and look forward to participating in their care.  A copy of this report was sent to the requesting provider on this date.  Electronically Signed: D. Rowe Robert, PA-C 04/01/2017, 1:10 PM   I spent a total of 25 minutes in face to face in clinical consultation, greater than 50% of which was counseling/coordinating care for Port-A-Cath placement

## 2017-04-01 NOTE — Procedures (Signed)
Pre Procedure Dx: Bladder Cancer Post Procedural Dx: Same  Successful placement of right IJ approach port-a-cath with tip at the superior caval atrial junction. The catheter is ready for immediate use.  Estimated Blood Loss: Minimal  Complications: None immediate.  Ronny Bacon, MD Pager #: [phone]

## 2017-04-01 NOTE — Discharge Instructions (Signed)
Implanted Port Insertion, Care After °This sheet gives you information about how to care for yourself after your procedure. Your health care provider may also give you more specific instructions. If you have problems or questions, contact your health care provider. °What can I expect after the procedure? °After your procedure, it is common to have: °· Discomfort at the port insertion site. °· Bruising on the skin over the port. This should improve over 3-4 days. ° °Follow these instructions at home: °Port care °· After your port is placed, you will get a manufacturer's information card. The card has information about your port. Keep this card with you at all times. °· Take care of the port as told by your health care provider. Ask your health care provider if you or a family member can get training for taking care of the port at home. A home health care nurse may also take care of the port. °· Make sure to remember what type of port you have. °Incision care °· Follow instructions from your health care provider about how to take care of your port insertion site. Make sure you: °? Wash your hands with soap and water before you change your bandage (dressing). If soap and water are not available, use hand sanitizer. °? Change your dressing as told by your health care provider. °? Leave stitches (sutures), skin glue, or adhesive strips in place. These skin closures may need to stay in place for 2 weeks or longer. If adhesive strip edges start to loosen and curl up, you may trim the loose edges. Do not remove adhesive strips completely unless your health care provider tells you to do that. °· Check your port insertion site every day for signs of infection. Check for: °? More redness, swelling, or pain. °? More fluid or blood. °? Warmth. °? Pus or a bad smell. °General instructions °· Do not take baths, swim, or use a hot tub until your health care provider approves. °· Do not lift anything that is heavier than 10 lb (4.5  kg) for a week, or as told by your health care provider. °· Ask your health care provider when it is okay to: °? Return to work or school. °? Resume usual physical activities or sports. °· Do not drive for 24 hours if you were given a medicine to help you relax (sedative). °· Take over-the-counter and prescription medicines only as told by your health care provider. °· Wear a medical alert bracelet in case of an emergency. This will tell any health care providers that you have a port. °· Keep all follow-up visits as told by your health care provider. This is important. °Contact a health care provider if: °· You cannot flush your port with saline as directed, or you cannot draw blood from the port. °· You have a fever or chills. °· You have more redness, swelling, or pain around your port insertion site. °· You have more fluid or blood coming from your port insertion site. °· Your port insertion site feels warm to the touch. °· You have pus or a bad smell coming from the port insertion site. °Get help right away if: °· You have chest pain or shortness of breath. °· You have bleeding from your port that you cannot control. °Summary °· Take care of the port as told by your health care provider. °· Change your dressing as told by your health care provider. °· Keep all follow-up visits as told by your health care provider. °  This information is not intended to replace advice given to you by your health care provider. Make sure you discuss any questions you have with your health care provider. °Document Released: 02/16/2013 Document Revised: 03/19/2016 Document Reviewed: 03/19/2016 °Elsevier Interactive Patient Education © 2017 [employer] °Moderate Conscious Sedation, Adult, Care After °These instructions provide you with information about caring for yourself after your procedure. Your health care provider may also give you more specific instructions. Your treatment has been planned according to current medical  practices, but problems sometimes occur. Call your health care provider if you have any problems or questions after your procedure. °What can I expect after the procedure? °After your procedure, it is common: °· To feel sleepy for several hours. °· To feel clumsy and have poor balance for several hours. °· To have poor judgment for several hours. °· To vomit if you eat too soon. ° °Follow these instructions at home: °For at least 24 hours after the procedure: ° °· Do not: °? Participate in activities where you could fall or become injured. °? Drive. °? Use heavy machinery. °? Drink alcohol. °? Take sleeping pills or medicines that cause drowsiness. °? Make important decisions or sign legal documents. °? Take care of children on your own. °· Rest. °Eating and drinking °· Follow the diet recommended by your health care provider. °· If you vomit: °? Drink water, juice, or soup when you can drink without vomiting. °? Make sure you have little or no nausea before eating solid foods. °General instructions °· Have a responsible adult stay with you until you are awake and alert. °· Take over-the-counter and prescription medicines only as told by your health care provider. °· If you smoke, do not smoke without supervision. °· Keep all follow-up visits as told by your health care provider. This is important. °Contact a health care provider if: °· You keep feeling nauseous or you keep vomiting. °· You feel light-headed. °· You develop a rash. °· You have a fever. °Get help right away if: °· You have trouble breathing. °This information is not intended to replace advice given to you by your health care provider. Make sure you discuss any questions you have with your health care provider. °Document Released: 02/16/2013 Document Revised: 10/01/2015 Document Reviewed: 08/18/2015 °Elsevier Interactive Patient Education © 2018 [employer] ° °

## 2017-04-06 ENCOUNTER — Other Ambulatory Visit: Payer: Self-pay

## 2017-04-06 ENCOUNTER — Other Ambulatory Visit: Payer: Medicare Other

## 2017-04-06 ENCOUNTER — Ambulatory Visit (HOSPITAL_BASED_OUTPATIENT_CLINIC_OR_DEPARTMENT_OTHER): Payer: Medicare Other

## 2017-04-06 ENCOUNTER — Other Ambulatory Visit (HOSPITAL_BASED_OUTPATIENT_CLINIC_OR_DEPARTMENT_OTHER): Payer: Medicare Other

## 2017-04-06 VITALS — BP 136/78 | HR 74 | Temp 98.6°F | Resp 18 | Wt 210.5 lb

## 2017-04-06 DIAGNOSIS — Z5111 Encounter for antineoplastic chemotherapy: Secondary | ICD-10-CM

## 2017-04-06 DIAGNOSIS — C641 Malignant neoplasm of right kidney, except renal pelvis: Secondary | ICD-10-CM | POA: Diagnosis not present

## 2017-04-06 DIAGNOSIS — C651 Malignant neoplasm of right renal pelvis: Secondary | ICD-10-CM

## 2017-04-06 LAB — CBC WITH DIFFERENTIAL (CANCER CENTER ONLY)
BASO#: 0 10*3/uL (ref 0.0–0.2)
BASO%: 0.8 % (ref 0.0–2.0)
EOS%: 3.5 % (ref 0.0–7.0)
Eosinophils Absolute: 0.1 10*3/uL (ref 0.0–0.5)
HCT: 34.7 % — ABNORMAL LOW (ref 38.7–49.9)
HGB: 11.2 g/dL — ABNORMAL LOW (ref 13.0–17.1)
LYMPH#: 1.3 10*3/uL (ref 0.9–3.3)
LYMPH%: 33.1 % (ref 14.0–48.0)
MCH: 26.7 pg — ABNORMAL LOW (ref 28.0–33.4)
MCHC: 32.3 g/dL (ref 32.0–35.9)
MCV: 83 fL (ref 82–98)
MONO#: 0.5 10*3/uL (ref 0.1–0.9)
MONO%: 12.6 % (ref 0.0–13.0)
NEUT#: 2 10*3/uL (ref 1.5–6.5)
NEUT%: 50 % (ref 40.0–80.0)
PLATELETS: 162 10*3/uL (ref 145–400)
RBC: 4.2 10*6/uL (ref 4.20–5.70)
RDW: 14.9 % (ref 11.1–15.7)
WBC: 4 10*3/uL (ref 4.0–10.0)

## 2017-04-06 LAB — CMP (CANCER CENTER ONLY)
ALK PHOS: 65 U/L (ref 26–84)
ALT: 69 U/L — AB (ref 10–47)
AST: 48 U/L — ABNORMAL HIGH (ref 11–38)
Albumin: 3.7 g/dL (ref 3.3–5.5)
BILIRUBIN TOTAL: 0.6 mg/dL (ref 0.20–1.60)
BUN: 16 mg/dL (ref 7–22)
CO2: 24 mEq/L (ref 18–33)
CREATININE: 1.6 mg/dL — AB (ref 0.6–1.2)
Calcium: 8.7 mg/dL (ref 8.0–10.3)
Chloride: 101 mEq/L (ref 98–108)
Glucose, Bld: 285 mg/dL — ABNORMAL HIGH (ref 73–118)
POTASSIUM: 4.6 meq/L (ref 3.3–4.7)
Sodium: 138 mEq/L (ref 128–145)
TOTAL PROTEIN: 6.6 g/dL (ref 6.4–8.1)

## 2017-04-06 MED ORDER — SODIUM CHLORIDE 0.9% FLUSH
10.0000 mL | INTRAVENOUS | Status: DC | PRN
  Administered 2017-04-06: 10 mL
  Filled 2017-04-06: qty 10

## 2017-04-06 MED ORDER — SODIUM CHLORIDE 0.9 % IV SOLN
1000.0000 mg/m2 | Freq: Once | INTRAVENOUS | Status: AC
  Administered 2017-04-06: 2128 mg via INTRAVENOUS
  Filled 2017-04-06: qty 50.74

## 2017-04-06 MED ORDER — PROCHLORPERAZINE MALEATE 10 MG PO TABS
10.0000 mg | ORAL_TABLET | Freq: Once | ORAL | Status: AC
  Administered 2017-04-06: 10 mg via ORAL

## 2017-04-06 MED ORDER — SODIUM CHLORIDE 0.9 % IV SOLN
Freq: Once | INTRAVENOUS | Status: AC
  Administered 2017-04-06: 15:00:00 via INTRAVENOUS

## 2017-04-06 MED ORDER — HEPARIN SOD (PORK) LOCK FLUSH 100 UNIT/ML IV SOLN
500.0000 [IU] | Freq: Once | INTRAVENOUS | Status: AC | PRN
  Administered 2017-04-06: 500 [IU]
  Filled 2017-04-06: qty 5

## 2017-04-06 NOTE — Patient Instructions (Signed)

## 2017-04-07 ENCOUNTER — Other Ambulatory Visit: Payer: Self-pay

## 2017-04-07 ENCOUNTER — Encounter: Payer: Self-pay | Admitting: Family

## 2017-04-07 ENCOUNTER — Ambulatory Visit (HOSPITAL_BASED_OUTPATIENT_CLINIC_OR_DEPARTMENT_OTHER): Payer: Medicare Other | Admitting: Family

## 2017-04-07 ENCOUNTER — Other Ambulatory Visit: Payer: Medicare Other

## 2017-04-07 ENCOUNTER — Ambulatory Visit (HOSPITAL_BASED_OUTPATIENT_CLINIC_OR_DEPARTMENT_OTHER): Payer: Medicare Other

## 2017-04-07 VITALS — BP 141/69 | HR 75 | Temp 97.8°F | Resp 18

## 2017-04-07 DIAGNOSIS — C651 Malignant neoplasm of right renal pelvis: Secondary | ICD-10-CM

## 2017-04-07 DIAGNOSIS — Z5111 Encounter for antineoplastic chemotherapy: Secondary | ICD-10-CM | POA: Diagnosis not present

## 2017-04-07 DIAGNOSIS — C641 Malignant neoplasm of right kidney, except renal pelvis: Secondary | ICD-10-CM

## 2017-04-07 DIAGNOSIS — C679 Malignant neoplasm of bladder, unspecified: Secondary | ICD-10-CM

## 2017-04-07 LAB — LACTATE DEHYDROGENASE: LDH: 174 U/L (ref 125–245)

## 2017-04-07 MED ORDER — SODIUM CHLORIDE 0.9 % IV SOLN
Freq: Once | INTRAVENOUS | Status: AC
  Administered 2017-04-07: 09:00:00 via INTRAVENOUS

## 2017-04-07 MED ORDER — PALONOSETRON HCL INJECTION 0.25 MG/5ML
0.2500 mg | Freq: Once | INTRAVENOUS | Status: AC
  Administered 2017-04-07: 0.25 mg via INTRAVENOUS

## 2017-04-07 MED ORDER — FOSAPREPITANT DIMEGLUMINE INJECTION 150 MG
Freq: Once | INTRAVENOUS | Status: AC
  Administered 2017-04-07: 11:00:00 via INTRAVENOUS
  Filled 2017-04-07: qty 5

## 2017-04-07 MED ORDER — POTASSIUM CHLORIDE 2 MEQ/ML IV SOLN
Freq: Once | INTRAVENOUS | Status: AC
  Administered 2017-04-07: 09:00:00 via INTRAVENOUS
  Filled 2017-04-07: qty 10

## 2017-04-07 MED ORDER — SODIUM CHLORIDE 0.9 % IV SOLN
70.5000 mg/m2 | Freq: Once | INTRAVENOUS | Status: AC
  Administered 2017-04-07: 150 mg via INTRAVENOUS
  Filled 2017-04-07: qty 50

## 2017-04-07 MED ORDER — PALONOSETRON HCL INJECTION 0.25 MG/5ML
INTRAVENOUS | Status: AC
  Filled 2017-04-07: qty 5

## 2017-04-07 MED ORDER — HEPARIN SOD (PORK) LOCK FLUSH 100 UNIT/ML IV SOLN
500.0000 [IU] | Freq: Once | INTRAVENOUS | Status: AC | PRN
  Administered 2017-04-07: 500 [IU]
  Filled 2017-04-07: qty 5

## 2017-04-07 MED ORDER — SODIUM CHLORIDE 0.9% FLUSH
10.0000 mL | INTRAVENOUS | Status: DC | PRN
  Administered 2017-04-07: 10 mL
  Filled 2017-04-07: qty 10

## 2017-04-07 NOTE — Progress Notes (Signed)
Hematology and Oncology Follow Up Visit  ERRIN [name] [id number] 12-01-1955 61 y.o. 04/07/2017   Principle Diagnosis:  Stage III (T3NxM0) Infiltrating high grade papillary urothelial carcinoma of the RIGHT renal hilum - right nephroureterectomy on 02/12/2017  Current Therapy:   Cisplatin/Gemcitabone started cycle 1 04/06/17   Interim History:  [name] is here today with his mother for follow-up and day 2 of cycle 1. He did well with treatment yesterday and has no complaints at this time other than occasional light headedness.  He has had no syncope or falls.  No bleeding, bruising or petechiae. No lymphadenopathy found on exam.  No fever, chills, n/v, cough, rash, dizziness, SOB, chest pain, palpitations, abdominal pain or changes in bowel or bladder habits.  He has occasional puffiness in his feet and ankles. This improves with propping up his feet. No tenderness, numbness or tingling in his extremities at this time.  His appetite comes and goes. He is hydrating well and his weight is stable.   ECOG Performance Status: 1 - Symptomatic but completely ambulatory  Medications:  Allergies as of 04/07/2017      Reactions   Dilaudid [hydromorphone] Nausea And Vomiting   Hydrocodone Itching, Nausea And Vomiting      Medication List        Accurate as of 04/07/17 10:14 AM. Always use your most recent med list.          amLODipine 10 MG tablet Commonly known as:  NORVASC Take 1 tablet (10 mg total) by mouth daily. Hold for SBP < 110   canagliflozin 300 MG Tabs tablet Commonly known as:  INVOKANA Take 1 tablet (300 mg total) by mouth daily.   dexamethasone 4 MG tablet Commonly known as:  DECADRON Take 2 tablets by mouth once a day on the day after cisplatin chemotherapy and then take 2 tablets two times a day for 2 days. Take with food.   docusate sodium 100 MG capsule Commonly known as:  COLACE Take 1 capsule (100 mg total) by mouth 2 (two) times daily as needed  (take to keep stool soft.).   glimepiride 2 MG tablet Commonly known as:  AMARYL TAKE 1 BY MOUTH DAILY BEFORE SUPPER   Insulin Glargine 300 UNIT/ML Sopn Inject into the skin.   isosorbide mononitrate 120 MG 24 hr tablet Commonly known as:  IMDUR Take 1 tablet (120 mg total) by mouth daily.   lidocaine-prilocaine cream Commonly known as:  EMLA Apply to affected area once   LORazepam 0.5 MG tablet Commonly known as:  ATIVAN Take 1 tablet (0.5 mg total) every 6 (six) hours as needed by mouth (Nausea or vomiting).   metFORMIN 1000 MG tablet Commonly known as:  GLUCOPHAGE Take 1 tablet (1,000 mg total) by mouth 2 (two) times daily with a meal.   metoprolol succinate 25 MG 24 hr tablet Commonly known as:  TOPROL-XL Take 1 tablet (25 mg total) at bedtime by mouth.   nitroGLYCERIN 0.4 MG SL tablet Commonly known as:  NITROSTAT Place 1 tablet (0.4 mg total) under the tongue every 5 (five) minutes as needed for chest pain.   ondansetron 8 MG tablet Commonly known as:  ZOFRAN Take 1 tablet (8 mg total) 2 (two) times daily as needed by mouth. Start on the third day after cisplatin chemotherapy.   Oxycodone HCl 10 MG Tabs Take 0.5-1 tablets (5-10 mg total) by mouth every 4 (four) hours as needed for moderate pain.   pantoprazole 40 MG tablet Commonly known  as:  PROTONIX Take 1 tablet (40 mg total) by mouth every morning.   prochlorperazine 10 MG tablet Commonly known as:  COMPAZINE Take 1 tablet (10 mg total) every 6 (six) hours as needed by mouth (Nausea or vomiting).   rOPINIRole 2 MG tablet Commonly known as:  REQUIP Take 1 tablet (2 mg total) by mouth at bedtime.   rosuvastatin 10 MG tablet Commonly known as:  CRESTOR TAKE 1 TAB (10 MG) BY MOUTH EVERY OTHER DAY   tamsulosin 0.4 MG Caps capsule Commonly known as:  FLOMAX Take 0.4 mg by mouth.       Allergies:  Allergies  Allergen Reactions  . Dilaudid [Hydromorphone] Nausea And Vomiting  . Hydrocodone Itching  and Nausea And Vomiting    Past Medical History, Surgical history, Social history, and Family History were reviewed and updated.  Review of Systems: All other 10 point review of systems is negative.   Physical Exam:  vitals were not taken for this visit.   Wt Readings from Last 3 Encounters:  04/06/17 210 lb 8 oz (95.5 kg)  04/01/17 206 lb (93.4 kg)  02/12/17 206 lb (93.4 kg)    Ocular: Sclerae unicteric, pupils equal, round and reactive to light Ear-nose-throat: Oropharynx clear, dentition fair Lymphatic: No cervical, supraclavicular or axillary adenopathy Lungs no rales or rhonchi, good excursion bilaterally Heart regular rate and rhythm, no murmur appreciated Abd soft, nontender, positive bowel sounds, no liver or spleen tip palpated on exam, no fluid wave  MSK no focal spinal tenderness, no joint edema Neuro: non-focal, well-oriented, appropriate affect Breasts: Deferred   Lab Results  Component Value Date   WBC 4.0 04/06/2017   HGB 11.2 (L) 04/06/2017   HCT 34.7 (L) 04/06/2017   MCV 83 04/06/2017   PLT 162 04/06/2017   Lab Results  Component Value Date   FERRITIN 41 10/10/2016   IRON 141 01/25/2015   IRONPCTSAT 34.5 01/25/2015   Lab Results  Component Value Date   RBC 4.20 04/06/2017   No results found for: KPAFRELGTCHN, LAMBDASER, KAPLAMBRATIO Lab Results  Component Value Date   IGA 232 01/25/2015   No results found for: Ronnald Ramp, A1GS, Nelida Meuse, SPEI   Chemistry      Component Value Date/Time   NA 138 04/06/2017 1428   K 4.6 04/06/2017 1428   CL 101 04/06/2017 1428   CO2 24 04/06/2017 1428   BUN 16 04/06/2017 1428   CREATININE 1.6 (H) 04/06/2017 1428   GLU 86 03/31/2016      Component Value Date/Time   CALCIUM 8.7 04/06/2017 1428   ALKPHOS 65 04/06/2017 1428   AST 48 (H) 04/06/2017 1428   ALT 69 (H) 04/06/2017 1428   BILITOT 0.60 04/06/2017 1428      Impression and Plan: [name] is a very  pleasant 61 yo caucasian gentleman with infiltrating high grade papillary urothelial carcinoma with right nephrectomy in October 2018. He also has a strong family history of bladder cancer.  His PET scan earlier this month showed no FDG avid malignancy identified.  He did well yesterday with day one of treatment and has no complaints today other than occasional dizziness which he states was present prior to his starting chemo.  We will proceed with day 2 today as planned.  He has his current treatment and appointment schedule.  Both he and his mother know to contact our office with any questions or concerns. We can certainly see him sooner if need be.  Eliezer Bottom, NP 11/27/201810:14 AM

## 2017-04-07 NOTE — Patient Instructions (Signed)
[facility] Discharge Instructions for Patients Receiving Chemotherapy  Today you received the following chemotherapy agents:  Cisplatin  To help prevent nausea and vomiting after your treatment, we encourage you to take your nausea medication as ordered per MD.    If you develop nausea and vomiting that is not controlled by your nausea medication, call the clinic.   BELOW ARE SYMPTOMS THAT SHOULD BE REPORTED IMMEDIATELY:  *FEVER GREATER THAN 100.5 F  *CHILLS WITH OR WITHOUT FEVER  NAUSEA AND VOMITING THAT IS NOT CONTROLLED WITH YOUR NAUSEA MEDICATION  *UNUSUAL SHORTNESS OF BREATH  *UNUSUAL BRUISING OR BLEEDING  TENDERNESS IN MOUTH AND THROAT WITH OR WITHOUT PRESENCE OF ULCERS  *URINARY PROBLEMS  *BOWEL PROBLEMS  UNUSUAL RASH Items with * indicate a potential emergency and should be followed up as soon as possible.  Feel free to call the clinic should you have any questions or concerns. The clinic phone number is [phone].  Please show the CHEMO ALERT CARD at check-in to the Emergency Department and triage nurse.   

## 2017-04-13 ENCOUNTER — Telehealth: Payer: Self-pay | Admitting: *Deleted

## 2017-04-13 ENCOUNTER — Ambulatory Visit: Payer: Medicare Other

## 2017-04-13 ENCOUNTER — Other Ambulatory Visit: Payer: Medicare Other

## 2017-04-13 ENCOUNTER — Other Ambulatory Visit (HOSPITAL_BASED_OUTPATIENT_CLINIC_OR_DEPARTMENT_OTHER): Payer: Medicare Other

## 2017-04-13 ENCOUNTER — Other Ambulatory Visit: Payer: Self-pay | Admitting: Family

## 2017-04-13 DIAGNOSIS — C641 Malignant neoplasm of right kidney, except renal pelvis: Secondary | ICD-10-CM

## 2017-04-13 DIAGNOSIS — D509 Iron deficiency anemia, unspecified: Secondary | ICD-10-CM

## 2017-04-13 DIAGNOSIS — C679 Malignant neoplasm of bladder, unspecified: Secondary | ICD-10-CM

## 2017-04-13 LAB — CMP (CANCER CENTER ONLY)
ALBUMIN: 3.5 g/dL (ref 3.3–5.5)
ALK PHOS: 63 U/L (ref 26–84)
ALT: 128 U/L — AB (ref 10–47)
AST: 58 U/L — AB (ref 11–38)
BILIRUBIN TOTAL: 0.6 mg/dL (ref 0.20–1.60)
BUN, Bld: 17 mg/dL (ref 7–22)
CALCIUM: 8.6 mg/dL (ref 8.0–10.3)
CO2: 25 mEq/L (ref 18–33)
CREATININE: 1.4 mg/dL — AB (ref 0.6–1.2)
Chloride: 97 mEq/L — ABNORMAL LOW (ref 98–108)
Glucose, Bld: 381 mg/dL — ABNORMAL HIGH (ref 73–118)
Potassium: 4.3 mEq/L (ref 3.3–4.7)
Sodium: 135 mEq/L (ref 128–145)
TOTAL PROTEIN: 6.3 g/dL — AB (ref 6.4–8.1)

## 2017-04-13 LAB — LACTATE DEHYDROGENASE: LDH: 175 U/L (ref 125–245)

## 2017-04-13 LAB — CBC WITH DIFFERENTIAL (CANCER CENTER ONLY)
BASO#: 0 10*3/uL (ref 0.0–0.2)
BASO%: 0 % (ref 0.0–2.0)
EOS%: 1 % (ref 0.0–7.0)
Eosinophils Absolute: 0 10*3/uL (ref 0.0–0.5)
HEMATOCRIT: 36.3 % — AB (ref 38.7–49.9)
HEMOGLOBIN: 12.1 g/dL — AB (ref 13.0–17.1)
LYMPH#: 1.2 10*3/uL (ref 0.9–3.3)
LYMPH%: 41.5 % (ref 14.0–48.0)
MCH: 26.3 pg — ABNORMAL LOW (ref 28.0–33.4)
MCHC: 33.3 g/dL (ref 32.0–35.9)
MCV: 79 fL — AB (ref 82–98)
MONO#: 0.2 10*3/uL (ref 0.1–0.9)
MONO%: 8 % (ref 0.0–13.0)
NEUT%: 49.5 % (ref 40.0–80.0)
NEUTROS ABS: 1.4 10*3/uL — AB (ref 1.5–6.5)
Platelets: 131 10*3/uL — ABNORMAL LOW (ref 145–400)
RBC: 4.6 10*6/uL (ref 4.20–5.70)
RDW: 14.8 % (ref 11.1–15.7)
WBC: 2.9 10*3/uL — AB (ref 4.0–10.0)

## 2017-04-13 NOTE — Progress Notes (Signed)
Gemzar held today per Dr [name] d/t AST & ALT. Pt  Aware. dph

## 2017-04-13 NOTE — Patient Instructions (Signed)
Implanted Port Home Guide An implanted port is a type of central line that is placed under the skin. Central lines are used to provide IV access when treatment or nutrition needs to be given through a person's veins. Implanted ports are used for long-term IV access. An implanted port may be placed because:  You need IV medicine that would be irritating to the small veins in your hands or arms.  You need long-term IV medicines, such as antibiotics.  You need IV nutrition for a long period.  You need frequent blood draws for lab tests.  You need dialysis.  Implanted ports are usually placed in the chest area, but they can also be placed in the upper arm, the abdomen, or the leg. An implanted port has two main parts:  Reservoir. The reservoir is round and will appear as a small, raised area under your skin. The reservoir is the part where a needle is inserted to give medicines or draw blood.  Catheter. The catheter is a thin, flexible tube that extends from the reservoir. The catheter is placed into a large vein. Medicine that is inserted into the reservoir goes into the catheter and then into the vein.  How will I care for my incision site? Do not get the incision site wet. Bathe or shower as directed by your health care provider. How is my port accessed? Special steps must be taken to access the port:  Before the port is accessed, a numbing cream can be placed on the skin. This helps numb the skin over the port site.  Your health care provider uses a sterile technique to access the port. ? Your health care provider must put on a mask and sterile gloves. ? The skin over your port is cleaned carefully with an antiseptic and allowed to dry. ? The port is gently pinched between sterile gloves, and a needle is inserted into the port.  Only "non-coring" port needles should be used to access the port. Once the port is accessed, a blood return should be checked. This helps ensure that the port  is in the vein and is not clogged.  If your port needs to remain accessed for a constant infusion, a clear (transparent) bandage will be placed over the needle site. The bandage and needle will need to be changed every week, or as directed by your health care provider.  Keep the bandage covering the needle clean and dry. Do not get it wet. Follow your health care provider's instructions on how to take a shower or bath while the port is accessed.  If your port does not need to stay accessed, no bandage is needed over the port.  What is flushing? Flushing helps keep the port from getting clogged. Follow your health care provider's instructions on how and when to flush the port. Ports are usually flushed with saline solution or a medicine called heparin. The need for flushing will depend on how the port is used.  If the port is used for intermittent medicines or blood draws, the port will need to be flushed: ? After medicines have been given. ? After blood has been drawn. ? As part of routine maintenance.  If a constant infusion is running, the port may not need to be flushed.  How long will my port stay implanted? The port can stay in for as long as your health care provider thinks it is needed. When it is time for the port to come out, surgery will be   done to remove it. The procedure is similar to the one performed when the port was put in. When should I seek immediate medical care? When you have an implanted port, you should seek immediate medical care if:  You notice a bad smell coming from the incision site.  You have swelling, redness, or drainage at the incision site.  You have more swelling or pain at the port site or the surrounding area.  You have a fever that is not controlled with medicine.  This information is not intended to replace advice given to you by your health care provider. Make sure you discuss any questions you have with your health care provider. Document  Released: 04/28/2005 Document Revised: 10/04/2015 Document Reviewed: 01/03/2013 Elsevier Interactive Patient Education  2017 Elsevier Inc.  

## 2017-04-13 NOTE — Telephone Encounter (Signed)
Spoke with pt during today's visit regarding how he tolerated his 1st Gemzar/Cisplatin administered last week. Pt denies nausea/vomiting/bowel changes/alteration in appetite. Reports fatigue that started on Saturday. Per Judson Roch, NP iron studies added to today's labs. dph

## 2017-04-14 LAB — IRON AND TIBC
%SAT: 33 % (ref 20–55)
Iron: 104 ug/dL (ref 42–163)
TIBC: 314 ug/dL (ref 202–409)
UIBC: 210 ug/dL (ref 117–376)

## 2017-04-14 LAB — FERRITIN: Ferritin: 233 ng/ml (ref 22–316)

## 2017-04-23 DIAGNOSIS — Z794 Long term (current) use of insulin: Secondary | ICD-10-CM | POA: Diagnosis not present

## 2017-04-23 DIAGNOSIS — E1165 Type 2 diabetes mellitus with hyperglycemia: Secondary | ICD-10-CM | POA: Diagnosis not present

## 2017-04-27 ENCOUNTER — Other Ambulatory Visit: Payer: Self-pay | Admitting: *Deleted

## 2017-04-27 ENCOUNTER — Other Ambulatory Visit: Payer: Self-pay

## 2017-04-27 ENCOUNTER — Ambulatory Visit: Payer: Medicare Other

## 2017-04-27 ENCOUNTER — Other Ambulatory Visit: Payer: Self-pay | Admitting: Family

## 2017-04-27 ENCOUNTER — Ambulatory Visit (HOSPITAL_BASED_OUTPATIENT_CLINIC_OR_DEPARTMENT_OTHER): Payer: Medicare Other

## 2017-04-27 ENCOUNTER — Ambulatory Visit (HOSPITAL_BASED_OUTPATIENT_CLINIC_OR_DEPARTMENT_OTHER): Payer: Medicare Other | Admitting: Family

## 2017-04-27 ENCOUNTER — Other Ambulatory Visit (HOSPITAL_BASED_OUTPATIENT_CLINIC_OR_DEPARTMENT_OTHER): Payer: Medicare Other

## 2017-04-27 ENCOUNTER — Encounter: Payer: Self-pay | Admitting: Family

## 2017-04-27 VITALS — BP 126/74 | HR 77 | Temp 98.0°F | Wt 207.0 lb

## 2017-04-27 DIAGNOSIS — R5383 Other fatigue: Secondary | ICD-10-CM

## 2017-04-27 DIAGNOSIS — C679 Malignant neoplasm of bladder, unspecified: Secondary | ICD-10-CM

## 2017-04-27 DIAGNOSIS — C651 Malignant neoplasm of right renal pelvis: Secondary | ICD-10-CM | POA: Diagnosis not present

## 2017-04-27 DIAGNOSIS — Z8052 Family history of malignant neoplasm of bladder: Secondary | ICD-10-CM

## 2017-04-27 DIAGNOSIS — E1165 Type 2 diabetes mellitus with hyperglycemia: Secondary | ICD-10-CM | POA: Diagnosis not present

## 2017-04-27 DIAGNOSIS — Z5111 Encounter for antineoplastic chemotherapy: Secondary | ICD-10-CM | POA: Diagnosis not present

## 2017-04-27 DIAGNOSIS — R066 Hiccough: Secondary | ICD-10-CM

## 2017-04-27 DIAGNOSIS — Z905 Acquired absence of kidney: Secondary | ICD-10-CM

## 2017-04-27 DIAGNOSIS — R0609 Other forms of dyspnea: Secondary | ICD-10-CM

## 2017-04-27 LAB — LACTATE DEHYDROGENASE: LDH: 215 U/L (ref 125–245)

## 2017-04-27 LAB — CBC WITH DIFFERENTIAL (CANCER CENTER ONLY)
BASO#: 0 10*3/uL (ref 0.0–0.2)
BASO%: 1.2 % (ref 0.0–2.0)
EOS%: 2.6 % (ref 0.0–7.0)
Eosinophils Absolute: 0.1 10*3/uL (ref 0.0–0.5)
HEMATOCRIT: 37 % — AB (ref 38.7–49.9)
HGB: 12.3 g/dL — ABNORMAL LOW (ref 13.0–17.1)
LYMPH#: 1.3 10*3/uL (ref 0.9–3.3)
LYMPH%: 37.4 % (ref 14.0–48.0)
MCH: 26.9 pg — ABNORMAL LOW (ref 28.0–33.4)
MCHC: 33.2 g/dL (ref 32.0–35.9)
MCV: 81 fL — AB (ref 82–98)
MONO#: 0.5 10*3/uL (ref 0.1–0.9)
MONO%: 13.5 % — ABNORMAL HIGH (ref 0.0–13.0)
NEUT#: 1.5 10*3/uL (ref 1.5–6.5)
NEUT%: 45.3 % (ref 40.0–80.0)
PLATELETS: 226 10*3/uL (ref 145–400)
RBC: 4.57 10*6/uL (ref 4.20–5.70)
RDW: 16.9 % — ABNORMAL HIGH (ref 11.1–15.7)
WBC: 3.4 10*3/uL — AB (ref 4.0–10.0)

## 2017-04-27 LAB — CMP (CANCER CENTER ONLY)
ALK PHOS: 66 U/L (ref 26–84)
ALT: 54 U/L — AB (ref 10–47)
AST: 62 U/L — ABNORMAL HIGH (ref 11–38)
Albumin: 3.6 g/dL (ref 3.3–5.5)
BUN, Bld: 15 mg/dL (ref 7–22)
CALCIUM: 9.2 mg/dL (ref 8.0–10.3)
CO2: 24 mEq/L (ref 18–33)
Chloride: 100 mEq/L (ref 98–108)
Creat: 1.3 mg/dl — ABNORMAL HIGH (ref 0.6–1.2)
Glucose, Bld: 251 mg/dL — ABNORMAL HIGH (ref 73–118)
POTASSIUM: 4.2 meq/L (ref 3.3–4.7)
Sodium: 141 mEq/L (ref 128–145)
TOTAL PROTEIN: 6.6 g/dL (ref 6.4–8.1)
Total Bilirubin: 0.7 mg/dl (ref 0.20–1.60)

## 2017-04-27 MED ORDER — BACLOFEN 10 MG PO TABS: 5.0000 mg | ORAL_TABLET | Freq: Two times a day (BID) | ORAL | 1 refills | Status: DC | PRN

## 2017-04-27 MED ORDER — FOSAPREPITANT DIMEGLUMINE INJECTION 150 MG
Freq: Once | INTRAVENOUS | Status: AC
  Administered 2017-04-27: 13:00:00 via INTRAVENOUS
  Filled 2017-04-27: qty 5

## 2017-04-27 MED ORDER — BACLOFEN 10 MG PO TABS: 10.0000 mg | ORAL_TABLET | Freq: Three times a day (TID) | ORAL | 1 refills | Status: DC | PRN

## 2017-04-27 MED ORDER — PALONOSETRON HCL INJECTION 0.25 MG/5ML
0.2500 mg | Freq: Once | INTRAVENOUS | Status: AC
  Administered 2017-04-27: 0.25 mg via INTRAVENOUS

## 2017-04-27 MED ORDER — SODIUM CHLORIDE 0.9 % IV SOLN
800.0000 mg/m2 | Freq: Once | INTRAVENOUS | Status: AC
  Administered 2017-04-27: 1710 mg via INTRAVENOUS
  Filled 2017-04-27: qty 23.94

## 2017-04-27 MED ORDER — HEPARIN SOD (PORK) LOCK FLUSH 100 UNIT/ML IV SOLN
500.0000 [IU] | Freq: Once | INTRAVENOUS | Status: AC | PRN
  Administered 2017-04-27: 500 [IU]
  Filled 2017-04-27: qty 5

## 2017-04-27 MED ORDER — PALONOSETRON HCL INJECTION 0.25 MG/5ML
INTRAVENOUS | Status: AC
  Filled 2017-04-27: qty 5

## 2017-04-27 MED ORDER — SODIUM CHLORIDE 0.9 % IV SOLN
70.5000 mg/m2 | Freq: Once | INTRAVENOUS | Status: AC
  Administered 2017-04-27: 150 mg via INTRAVENOUS
  Filled 2017-04-27: qty 100

## 2017-04-27 MED ORDER — SODIUM CHLORIDE 0.9 % IV SOLN
Freq: Once | INTRAVENOUS | Status: AC
  Administered 2017-04-27: 13:00:00 via INTRAVENOUS

## 2017-04-27 MED ORDER — SODIUM CHLORIDE 0.9% FLUSH
10.0000 mL | INTRAVENOUS | Status: DC | PRN
  Administered 2017-04-27: 10 mL
  Filled 2017-04-27: qty 10

## 2017-04-27 MED ORDER — MANNITOL 25 % IV SOLN
Freq: Once | INTRAVENOUS | Status: DC
  Administered 2017-04-27: 11:00:00 via INTRAVENOUS
  Filled 2017-04-27: qty 10

## 2017-04-27 MED FILL — BACLOFEN 10 MG TABS: 10 | 10 days supply | Qty: 30 | Fill #0

## 2017-04-27 NOTE — Patient Instructions (Signed)
Implanted Port Insertion, Care After °This sheet gives you information about how to care for yourself after your procedure. Your health care provider may also give you more specific instructions. If you have problems or questions, contact your health care provider. °What can I expect after the procedure? °After your procedure, it is common to have: °· Discomfort at the port insertion site. °· Bruising on the skin over the port. This should improve over 3-4 days. ° °Follow these instructions at home: °Port care °· After your port is placed, you will get a manufacturer's information card. The card has information about your port. Keep this card with you at all times. °· Take care of the port as told by your health care provider. Ask your health care provider if you or a family member can get training for taking care of the port at home. A home health care nurse may also take care of the port. °· Make sure to remember what type of port you have. °Incision care °· Follow instructions from your health care provider about how to take care of your port insertion site. Make sure you: °? Wash your hands with soap and water before you change your bandage (dressing). If soap and water are not available, use hand sanitizer. °? Change your dressing as told by your health care provider. °? Leave stitches (sutures), skin glue, or adhesive strips in place. These skin closures may need to stay in place for 2 weeks or longer. If adhesive strip edges start to loosen and curl up, you may trim the loose edges. Do not remove adhesive strips completely unless your health care provider tells you to do that. °· Check your port insertion site every day for signs of infection. Check for: °? More redness, swelling, or pain. °? More fluid or blood. °? Warmth. °? Pus or a bad smell. °General instructions °· Do not take baths, swim, or use a hot tub until your health care provider approves. °· Do not lift anything that is heavier than 10 lb (4.5  kg) for a week, or as told by your health care provider. °· Ask your health care provider when it is okay to: °? Return to work or school. °? Resume usual physical activities or sports. °· Do not drive for 24 hours if you were given a medicine to help you relax (sedative). °· Take over-the-counter and prescription medicines only as told by your health care provider. °· Wear a medical alert bracelet in case of an emergency. This will tell any health care providers that you have a port. °· Keep all follow-up visits as told by your health care provider. This is important. °Contact a health care provider if: °· You cannot flush your port with saline as directed, or you cannot draw blood from the port. °· You have a fever or chills. °· You have more redness, swelling, or pain around your port insertion site. °· You have more fluid or blood coming from your port insertion site. °· Your port insertion site feels warm to the touch. °· You have pus or a bad smell coming from the port insertion site. °Get help right away if: °· You have chest pain or shortness of breath. °· You have bleeding from your port that you cannot control. °Summary °· Take care of the port as told by your health care provider. °· Change your dressing as told by your health care provider. °· Keep all follow-up visits as told by your health care provider. °  This information is not intended to replace advice given to you by your health care provider. Make sure you discuss any questions you have with your health care provider. °Document Released: 02/16/2013 Document Revised: 03/19/2016 Document Reviewed: 03/19/2016 °Elsevier Interactive Patient Education © 2017 Elsevier Inc. ° °

## 2017-04-27 NOTE — Progress Notes (Signed)
Patient UOP was 500 cc.  Cisplatin released

## 2017-04-27 NOTE — Patient Instructions (Signed)
[facility] Discharge Instructions for Patients Receiving Chemotherapy  Today you received the following chemotherapy agents Cisplatin, Gemzar  To help prevent nausea and vomiting after your treatment, we encourage you to take your nausea medication    If you develop nausea and vomiting that is not controlled by your nausea medication, call the clinic.   BELOW ARE SYMPTOMS THAT SHOULD BE REPORTED IMMEDIATELY:  *FEVER GREATER THAN 100.5 F  *CHILLS WITH OR WITHOUT FEVER  NAUSEA AND VOMITING THAT IS NOT CONTROLLED WITH YOUR NAUSEA MEDICATION  *UNUSUAL SHORTNESS OF BREATH  *UNUSUAL BRUISING OR BLEEDING  TENDERNESS IN MOUTH AND THROAT WITH OR WITHOUT PRESENCE OF ULCERS  *URINARY PROBLEMS  *BOWEL PROBLEMS  UNUSUAL RASH Items with * indicate a potential emergency and should be followed up as soon as possible.  Feel free to call the clinic should you have any questions or concerns. The clinic phone number is [phone].  Please show the CHEMO ALERT CARD at check-in to the Emergency Department and triage nurse.   

## 2017-04-27 NOTE — Progress Notes (Addendum)
Hematology and Oncology Follow Up Visit  Isaac [name] [id number] Jan 30, 1956 61 y.o. 04/27/2017   Principle Diagnosis:  Stage III (T3NxM0)Infiltrating high grade papillary urothelial carcinomaof the RIGHT renal hilum -right nephroureterectomy on 02/12/2017  Current Therapy:   Cisplatin/Gemcitabine s/p cycle 1   Interim History:  [name] is here today with his step-mother for follow-up and treatment. He is having some mild fatigue at times. His SOB with over exertion is unchanged and he will take breaks to rest as needed.  No fever, chills, n/v, cough, rash, dizziness, SOB, chest pain, palpitations, abdominal pain or changes in bowel or bladder habits.  He has had several episodes of hiccups that linger.  No swelling, tenderness, numbness or tingling in his extremities at this time. No c/o pain at this time.  No episodes of bleeding, no bruising or petechiae. No lymphadenopathy found on exam.  He has a good appetite and is staying well hydrated. His weight is stable.   ECOG Performance Status: 1 - Symptomatic but completely ambulatory  Medications:  Allergies as of 04/27/2017      Reactions   Dilaudid [hydromorphone] Nausea And Vomiting   Hydrocodone Itching, Nausea And Vomiting      Medication List        Accurate as of 04/27/17 10:28 AM. Always use your most recent med list.          amLODipine 10 MG tablet Commonly known as:  NORVASC Take 1 tablet (10 mg total) by mouth daily. Hold for SBP < 110   dexamethasone 4 MG tablet Commonly known as:  DECADRON Take 2 tablets by mouth once a day on the day after cisplatin chemotherapy and then take 2 tablets two times a day for 2 days. Take with food.   docusate sodium 100 MG capsule Commonly known as:  COLACE Take 1 capsule (100 mg total) by mouth 2 (two) times daily as needed (take to keep stool soft.).   glimepiride 2 MG tablet Commonly known as:  AMARYL TAKE 1 BY MOUTH DAILY BEFORE SUPPER   INSULIN  GLARGINE [place] Inject 30 Units into the skin.   isosorbide mononitrate 120 MG 24 hr tablet Commonly known as:  IMDUR Take 1 tablet (120 mg total) by mouth daily.   lidocaine-prilocaine cream Commonly known as:  EMLA Apply to affected area once   LORazepam 0.5 MG tablet Commonly known as:  ATIVAN Take 1 tablet (0.5 mg total) every 6 (six) hours as needed by mouth (Nausea or vomiting).   metFORMIN 1000 MG tablet Commonly known as:  GLUCOPHAGE Take 1 tablet (1,000 mg total) by mouth 2 (two) times daily with a meal.   metoprolol succinate 25 MG 24 hr tablet Commonly known as:  TOPROL-XL Take 1 tablet (25 mg total) at bedtime by mouth.   nitroGLYCERIN 0.4 MG SL tablet Commonly known as:  NITROSTAT Place 1 tablet (0.4 mg total) under the tongue every 5 (five) minutes as needed for chest pain.   ondansetron 8 MG tablet Commonly known as:  ZOFRAN Take 1 tablet (8 mg total) 2 (two) times daily as needed by mouth. Start on the third day after cisplatin chemotherapy.   pantoprazole 40 MG tablet Commonly known as:  PROTONIX Take 1 tablet (40 mg total) by mouth every morning.   prochlorperazine 10 MG tablet Commonly known as:  COMPAZINE Take 1 tablet (10 mg total) every 6 (six) hours as needed by mouth (Nausea or vomiting).   rOPINIRole 2 MG tablet Commonly known as:  REQUIP Take 1 tablet (2 mg total) by mouth at bedtime.   rosuvastatin 10 MG tablet Commonly known as:  CRESTOR TAKE 1 TAB (10 MG) BY MOUTH EVERY OTHER DAY       Allergies:  Allergies  Allergen Reactions  . Dilaudid [Hydromorphone] Nausea And Vomiting  . Hydrocodone Itching and Nausea And Vomiting    Past Medical History, Surgical history, Social history, and Family History were reviewed and updated.  Review of Systems: All other 10 point review of systems is negative.   Physical Exam:  weight is 207 lb (93.9 kg). His oral temperature is 98 F (36.7 C). His blood pressure is 126/74 and his pulse is 77. His  oxygen saturation is 100%.   Wt Readings from Last 3 Encounters:  04/27/17 207 lb (93.9 kg)  04/06/17 210 lb 8 oz (95.5 kg)  04/01/17 206 lb (93.4 kg)    Ocular: Sclerae unicteric, pupils equal, round and reactive to light Ear-nose-throat: Oropharynx clear, dentition fair Lymphatic: No cervical, supraclavicular or axillary adenopathy Lungs no rales or rhonchi, good excursion bilaterally Heart regular rate and rhythm, no murmur appreciated Abd soft, nontender, positive bowel sounds, no liver or spleen tip palpated on exam, no fluid wave  MSK no focal spinal tenderness, no joint edema Neuro: non-focal, well-oriented, appropriate affect Breasts: Deferred   Lab Results  Component Value Date   WBC 3.4 (L) 04/27/2017   HGB 12.3 (L) 04/27/2017   HCT 37.0 (L) 04/27/2017   MCV 81 (L) 04/27/2017   PLT 226 04/27/2017   Lab Results  Component Value Date   FERRITIN 233 04/13/2017   IRON 104 04/13/2017   TIBC 314 04/13/2017   UIBC 210 04/13/2017   IRONPCTSAT 33 04/13/2017   Lab Results  Component Value Date   RBC 4.57 04/27/2017   No results found for: KPAFRELGTCHN, LAMBDASER, [facility] Lab Results  Component Value Date   IGA 232 01/25/2015   No results found for: Odetta Pink, SPEI   Chemistry      Component Value Date/Time   NA 141 04/27/2017 0939   K 4.2 04/27/2017 0939   CL 100 04/27/2017 0939   CO2 24 04/27/2017 0939   BUN 15 04/27/2017 0939   CREATININE 1.3 (H) 04/27/2017 0939   GLU 86 03/31/2016      Component Value Date/Time   CALCIUM 9.2 04/27/2017 0939   ALKPHOS 66 04/27/2017 0939   AST 62 (H) 04/27/2017 0939   ALT 54 (H) 04/27/2017 0939   BILITOT 0.70 04/27/2017 0939      Impression and Plan: [name] is a very pleasant 61 yo caucasian gentleman with infiltrating high grade papillary urothelial carcinoma with right nephrectomy in October 2018. He has a strong family history of bladder cancer  including his father who just passed away due to his malignancy. He is tolerating treatment well. Creatinine today is 1.3, BUN 15, Hgb stable at 12.3.  His PCP has adjusted his insulin for the decadron and blood sugar today is 251.   We will proceed with treatment today as planned per Dr. [name].  We will have him try Baclofen for hiccups. Prescription sent to pharmacy downstairs per his request.  He has his current treatment and appointment schedule. We will see him next week on Christmas Eve.  Both he and his family know to contact our office with any questions or concerns and to go to the ED in the event of an emergency.   Laverna Peace,  NP 12/17/201810:28 AM

## 2017-04-28 ENCOUNTER — Other Ambulatory Visit: Payer: Medicare Other

## 2017-04-28 ENCOUNTER — Ambulatory Visit: Payer: Medicare Other

## 2017-05-01 ENCOUNTER — Other Ambulatory Visit: Payer: Self-pay | Admitting: Family

## 2017-05-01 DIAGNOSIS — C676 Malignant neoplasm of ureteric orifice: Secondary | ICD-10-CM

## 2017-05-04 ENCOUNTER — Other Ambulatory Visit: Payer: Medicare Other

## 2017-05-04 ENCOUNTER — Ambulatory Visit (HOSPITAL_BASED_OUTPATIENT_CLINIC_OR_DEPARTMENT_OTHER): Payer: Medicare Other

## 2017-05-04 ENCOUNTER — Other Ambulatory Visit: Payer: Self-pay

## 2017-05-04 ENCOUNTER — Ambulatory Visit: Payer: Medicare Other

## 2017-05-04 VITALS — BP 134/86 | HR 87 | Temp 97.9°F | Resp 16

## 2017-05-04 DIAGNOSIS — C651 Malignant neoplasm of right renal pelvis: Secondary | ICD-10-CM

## 2017-05-04 DIAGNOSIS — C679 Malignant neoplasm of bladder, unspecified: Secondary | ICD-10-CM

## 2017-05-04 DIAGNOSIS — C641 Malignant neoplasm of right kidney, except renal pelvis: Secondary | ICD-10-CM | POA: Diagnosis not present

## 2017-05-04 DIAGNOSIS — Z5111 Encounter for antineoplastic chemotherapy: Secondary | ICD-10-CM

## 2017-05-04 LAB — CBC WITH DIFFERENTIAL (CANCER CENTER ONLY)
BASO#: 0 10*3/uL (ref 0.0–0.2)
BASO%: 0 % (ref 0.0–2.0)
EOS%: 1 % (ref 0.0–7.0)
Eosinophils Absolute: 0 10*3/uL (ref 0.0–0.5)
HCT: 35.5 % — ABNORMAL LOW (ref 38.7–49.9)
HGB: 11.8 g/dL — ABNORMAL LOW (ref 13.0–17.1)
LYMPH#: 1.5 10*3/uL (ref 0.9–3.3)
LYMPH%: 49.5 % — AB (ref 14.0–48.0)
MCH: 26.5 pg — ABNORMAL LOW (ref 28.0–33.4)
MCHC: 33.2 g/dL (ref 32.0–35.9)
MCV: 80 fL — AB (ref 82–98)
MONO#: 0.2 10*3/uL (ref 0.1–0.9)
MONO%: 5.3 % (ref 0.0–13.0)
NEUT#: 1.3 10*3/uL — ABNORMAL LOW (ref 1.5–6.5)
NEUT%: 44.2 % (ref 40.0–80.0)
PLATELETS: 140 10*3/uL — AB (ref 145–400)
RBC: 4.45 10*6/uL (ref 4.20–5.70)
RDW: 16.8 % — AB (ref 11.1–15.7)
WBC: 3 10*3/uL — AB (ref 4.0–10.0)

## 2017-05-04 LAB — CMP (CANCER CENTER ONLY)
ALT: 90 U/L — AB (ref 10–47)
AST: 42 U/L — ABNORMAL HIGH (ref 11–38)
Albumin: 3.4 g/dL (ref 3.3–5.5)
Alkaline Phosphatase: 58 U/L (ref 26–84)
BUN: 28 mg/dL — AB (ref 7–22)
CHLORIDE: 97 meq/L — AB (ref 98–108)
CO2: 23 mEq/L (ref 18–33)
CREATININE: 1.2 mg/dL (ref 0.6–1.2)
Calcium: 9.3 mg/dL (ref 8.0–10.3)
GLUCOSE: 344 mg/dL — AB (ref 73–118)
POTASSIUM: 4.5 meq/L (ref 3.3–4.7)
SODIUM: 134 meq/L (ref 128–145)
TOTAL PROTEIN: 6.3 g/dL — AB (ref 6.4–8.1)
Total Bilirubin: 0.6 mg/dl (ref 0.20–1.60)

## 2017-05-04 LAB — LACTATE DEHYDROGENASE: LDH: 178 U/L (ref 125–245)

## 2017-05-04 LAB — TECHNOLOGIST REVIEW CHCC SATELLITE

## 2017-05-04 MED ORDER — HEPARIN SOD (PORK) LOCK FLUSH 100 UNIT/ML IV SOLN
500.0000 [IU] | Freq: Once | INTRAVENOUS | Status: AC | PRN
  Administered 2017-05-04: 500 [IU]
  Filled 2017-05-04: qty 5

## 2017-05-04 MED ORDER — SODIUM CHLORIDE 0.9 % IV SOLN
Freq: Once | INTRAVENOUS | Status: AC
  Administered 2017-05-04: 10:00:00 via INTRAVENOUS

## 2017-05-04 MED ORDER — PROCHLORPERAZINE MALEATE 10 MG PO TABS
ORAL_TABLET | ORAL | Status: AC
  Filled 2017-05-04: qty 1

## 2017-05-04 MED ORDER — SODIUM CHLORIDE 0.9% FLUSH
10.0000 mL | INTRAVENOUS | Status: DC | PRN
  Administered 2017-05-04: 10 mL
  Filled 2017-05-04: qty 10

## 2017-05-04 MED ORDER — PROCHLORPERAZINE MALEATE 10 MG PO TABS
10.0000 mg | ORAL_TABLET | Freq: Once | ORAL | Status: AC
Start: 2017-05-04 — End: 2017-05-04
  Administered 2017-05-04: 10 mg via ORAL

## 2017-05-04 MED ORDER — SODIUM CHLORIDE 0.9 % IV SOLN
800.0000 mg/m2 | Freq: Once | INTRAVENOUS | Status: AC
  Administered 2017-05-04: 1710 mg via INTRAVENOUS
  Filled 2017-05-04: qty 23.94

## 2017-05-04 NOTE — Progress Notes (Signed)
Abnormal CBC & CMET results reviewed with Dr [name]. Per MD, pt to d/c oral dexamethasone until further notice. OK to treat with Gemzar today. dph

## 2017-05-04 NOTE — Patient Instructions (Signed)

## 2017-05-18 ENCOUNTER — Inpatient Hospital Stay: Payer: Medicare Other | Attending: Hematology & Oncology

## 2017-05-18 ENCOUNTER — Inpatient Hospital Stay (HOSPITAL_BASED_OUTPATIENT_CLINIC_OR_DEPARTMENT_OTHER): Payer: Medicare Other | Admitting: Family

## 2017-05-18 ENCOUNTER — Inpatient Hospital Stay: Payer: Medicare Other

## 2017-05-18 ENCOUNTER — Other Ambulatory Visit: Payer: Self-pay

## 2017-05-18 ENCOUNTER — Encounter: Payer: Self-pay | Admitting: Family

## 2017-05-18 VITALS — BP 107/67 | HR 68 | Temp 98.5°F | Resp 18 | Wt 206.0 lb

## 2017-05-18 DIAGNOSIS — E1165 Type 2 diabetes mellitus with hyperglycemia: Secondary | ICD-10-CM | POA: Insufficient documentation

## 2017-05-18 DIAGNOSIS — R110 Nausea: Secondary | ICD-10-CM | POA: Diagnosis not present

## 2017-05-18 DIAGNOSIS — C651 Malignant neoplasm of right renal pelvis: Secondary | ICD-10-CM

## 2017-05-18 DIAGNOSIS — C676 Malignant neoplasm of ureteric orifice: Secondary | ICD-10-CM

## 2017-05-18 DIAGNOSIS — R531 Weakness: Secondary | ICD-10-CM | POA: Insufficient documentation

## 2017-05-18 DIAGNOSIS — R42 Dizziness and giddiness: Secondary | ICD-10-CM

## 2017-05-18 DIAGNOSIS — Z79899 Other long term (current) drug therapy: Secondary | ICD-10-CM

## 2017-05-18 DIAGNOSIS — F419 Anxiety disorder, unspecified: Secondary | ICD-10-CM | POA: Diagnosis not present

## 2017-05-18 DIAGNOSIS — Z5111 Encounter for antineoplastic chemotherapy: Secondary | ICD-10-CM | POA: Insufficient documentation

## 2017-05-18 DIAGNOSIS — Z794 Long term (current) use of insulin: Secondary | ICD-10-CM | POA: Diagnosis not present

## 2017-05-18 LAB — CMP (CANCER CENTER ONLY)
ALT: 57 U/L — AB (ref 0–55)
AST: 48 U/L — ABNORMAL HIGH (ref 5–34)
Albumin: 3.6 g/dL (ref 3.5–5.0)
Alkaline Phosphatase: 60 U/L (ref 40–150)
Anion gap: 14 (ref 5–15)
BUN: 18 mg/dL (ref 7–26)
CHLORIDE: 103 mmol/L (ref 98–109)
CO2: 25 mmol/L (ref 22–29)
CREATININE: 1.4 mg/dL — AB (ref 0.70–1.30)
Calcium: 9.5 mg/dL (ref 8.4–10.4)
GFR, EST NON AFRICAN AMERICAN: 53 mL/min — AB (ref >60)
Glucose, Bld: 239 mg/dL — ABNORMAL HIGH (ref 70–140)
POTASSIUM: 4.2 mmol/L (ref 3.5–5.1)
Sodium: 142 mmol/L (ref 136–145)
Total Bilirubin: 0.6 mg/dL (ref 0.2–1.2)
Total Protein: 6.2 g/dL — ABNORMAL LOW (ref 6.4–8.3)

## 2017-05-18 LAB — CBC WITH DIFFERENTIAL (CANCER CENTER ONLY)
Abs Granulocyte: 1.1 10*3/uL — ABNORMAL LOW (ref 1.5–6.5)
BASOS ABS: 0 10*3/uL (ref 0.0–0.1)
Basophils Relative: 1 %
EOS PCT: 3 %
Eosinophils Absolute: 0.1 10*3/uL (ref 0.0–0.5)
HCT: 32.1 % — ABNORMAL LOW (ref 38.7–49.9)
Hemoglobin: 10.6 g/dL — ABNORMAL LOW (ref 13.0–17.1)
LYMPHS PCT: 41 %
Lymphs Abs: 1.3 10*3/uL (ref 0.9–3.3)
MCH: 27.4 pg — ABNORMAL LOW (ref 28.0–33.4)
MCHC: 33 g/dL (ref 32.0–35.9)
MCV: 82.9 fL (ref 82.0–98.0)
Monocytes Absolute: 0.7 10*3/uL (ref 0.1–0.9)
Monocytes Relative: 22 %
NEUTROS PCT: 33 %
Neutro Abs: 1.1 10*3/uL — ABNORMAL LOW (ref 1.5–6.5)
PLATELETS: 263 10*3/uL (ref 140–400)
RBC: 3.87 MIL/uL — AB (ref 4.20–5.70)
RDW: 19.2 % — ABNORMAL HIGH (ref 11.1–15.7)
WBC: 3.2 10*3/uL — AB (ref 4.0–10.3)

## 2017-05-18 LAB — LACTATE DEHYDROGENASE: LDH: 196 U/L (ref 125–245)

## 2017-05-18 MED ORDER — POTASSIUM CHLORIDE 2 MEQ/ML IV SOLN
Freq: Once | INTRAVENOUS | Status: AC
  Administered 2017-05-18: 10:00:00 via INTRAVENOUS
  Filled 2017-05-18: qty 10

## 2017-05-18 MED ORDER — PALONOSETRON HCL INJECTION 0.25 MG/5ML
0.2500 mg | Freq: Once | INTRAVENOUS | Status: AC
  Administered 2017-05-18: 0.25 mg via INTRAVENOUS

## 2017-05-18 MED ORDER — PALONOSETRON HCL INJECTION 0.25 MG/5ML
INTRAVENOUS | Status: AC
  Filled 2017-05-18: qty 5

## 2017-05-18 MED ORDER — HEPARIN SOD (PORK) LOCK FLUSH 100 UNIT/ML IV SOLN
500.0000 [IU] | Freq: Once | INTRAVENOUS | Status: AC | PRN
  Administered 2017-05-18: 500 [IU]
  Filled 2017-05-18: qty 5

## 2017-05-18 MED ORDER — SODIUM CHLORIDE 0.9 % IJ SOLN
10.0000 mL | Freq: Once | INTRAMUSCULAR | Status: AC
  Administered 2017-05-18: 10 mL
  Filled 2017-05-18: qty 10

## 2017-05-18 MED ORDER — SODIUM CHLORIDE 0.9 % IV SOLN
Freq: Once | INTRAVENOUS | Status: AC
  Administered 2017-05-18: 13:00:00 via INTRAVENOUS
  Filled 2017-05-18: qty 5

## 2017-05-18 MED ORDER — SODIUM CHLORIDE 0.9 % IV SOLN
800.0000 mg/m2 | Freq: Once | INTRAVENOUS | Status: AC
  Administered 2017-05-18: 1710 mg via INTRAVENOUS
  Filled 2017-05-18: qty 23.94

## 2017-05-18 MED ORDER — SODIUM CHLORIDE 0.9% FLUSH
10.0000 mL | INTRAVENOUS | Status: DC | PRN
  Administered 2017-05-18: 10 mL
  Filled 2017-05-18: qty 10

## 2017-05-18 MED ORDER — CISPLATIN CHEMO INJECTION 100MG/100ML
70.5000 mg/m2 | Freq: Once | INTRAVENOUS | Status: AC
  Administered 2017-05-18: 150 mg via INTRAVENOUS
  Filled 2017-05-18: qty 100

## 2017-05-18 MED ORDER — SODIUM CHLORIDE 0.9 % IV SOLN
Freq: Once | INTRAVENOUS | Status: AC
  Administered 2017-05-18: 10:00:00 via INTRAVENOUS

## 2017-05-18 NOTE — Progress Notes (Signed)
Hematology and Oncology Follow Up Visit  Isaac [name] [id number] October 18, 1955 62 y.o. 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 is here today with his mother for follow-up and cycle 3 of treatment. He is doing well but still having some fatigue and dizziness.  He has had ringing in his ears that comes and goes.  He has episodes of nausea without vomiting. He is off of Decadron due to hyperglycemia. He will try taking the Zofran and Compazine as needed for the nausea.  He also has Ativan for intermittent nausea and anxiety.  No fever, chills, cough, rash, SOB, chest pain, palpitations, abdominal pain or changes in bowel or bladder habits.  No swelling, tenderness, numbness or tingling in his extremities. No c/o pain.  No episodes of bleeding, no bruising or petechiae. No lymphadenopathy found on exam.  He has a good appetite and is staying well hydrated. His weight is stable.   ECOG Performance Status: 1 - Symptomatic but completely ambulatory  Medications:  Allergies as of 05/18/2017      Reactions   Dilaudid [hydromorphone] Nausea And Vomiting   Hydrocodone Itching, Nausea And Vomiting      Medication List        Accurate as of 05/18/17 10:05 AM. Always use your most recent med list.          amLODipine 10 MG tablet Commonly known as:  NORVASC Take 1 tablet (10 mg total) by mouth daily. Hold for SBP < 110   baclofen 10 MG tablet Commonly known as:  LIORESAL Take 0.5 tablets (5 mg total) by mouth every 12 (twelve) hours as needed for muscle spasms.   dexamethasone 4 MG tablet Commonly known as:  DECADRON Take 2 tablets by mouth once a day on the day after cisplatin chemotherapy and then take 2 tablets two times a day for 2 days. Take with food.     docusate sodium 100 MG capsule Commonly known as:  COLACE Take 1 capsule (100 mg total) by mouth 2 (two) times daily as needed (take to keep stool soft.).   glimepiride 2 MG tablet Commonly known as:  AMARYL TAKE 1 BY MOUTH DAILY BEFORE SUPPER   INSULIN GLARGINE [place] Inject 30 Units into the skin.   isosorbide mononitrate 120 MG 24 hr tablet Commonly known as:  IMDUR Take 1 tablet (120 mg total) by mouth daily.   lidocaine-prilocaine cream Commonly known as:  EMLA Apply to affected area once   LORazepam 0.5 MG tablet Commonly known as:  ATIVAN Take 1 tablet (0.5 mg total) every 6 (six) hours as needed by mouth (Nausea or vomiting).   metFORMIN 1000 MG tablet Commonly known as:  GLUCOPHAGE Take 1 tablet (1,000 mg total) by mouth 2 (two) times daily with a meal.   metoprolol succinate 25 MG 24 hr tablet Commonly known as:  TOPROL-XL Take 1 tablet (25 mg total) at bedtime by mouth.   nitroGLYCERIN 0.4 MG SL tablet Commonly known as:  NITROSTAT Place 1 tablet (0.4 mg total) under the tongue every 5 (five) minutes as needed for chest pain.   ondansetron 8 MG tablet Commonly known as:  ZOFRAN Take 1 tablet (8 mg total) 2 (two) times daily as needed by mouth. Start on the third day after cisplatin chemotherapy.   pantoprazole 40 MG tablet Commonly  known as:  PROTONIX Take 1 tablet (40 mg total) by mouth every morning.   prochlorperazine 10 MG tablet Commonly known as:  COMPAZINE Take 1 tablet (10 mg total) every 6 (six) hours as needed by mouth (Nausea or vomiting).   rOPINIRole 2 MG tablet Commonly known as:  REQUIP Take 1 tablet (2 mg total) by mouth at bedtime.   rosuvastatin 10 MG tablet Commonly known as:  CRESTOR TAKE 1 TAB (10 MG) BY MOUTH EVERY OTHER DAY       Allergies:  Allergies  Allergen Reactions  . Dilaudid [Hydromorphone] Nausea And Vomiting  . Hydrocodone Itching and Nausea And Vomiting    Past Medical History, Surgical history, Social  history, and Family History were reviewed and updated.  Review of Systems: All other 10 point review of systems is negative.   Physical Exam:  weight is 206 lb (93.4 kg). His oral temperature is 98.5 F (36.9 C). His blood pressure is 107/67 and his pulse is 68. His respiration is 18 and oxygen saturation is 98%.   Wt Readings from Last 3 Encounters:  05/18/17 206 lb (93.4 kg)  04/27/17 207 lb (93.9 kg)  04/06/17 210 lb 8 oz (95.5 kg)    Ocular: Sclerae unicteric, pupils equal, round and reactive to light Ear-nose-throat: Oropharynx clear, dentition fair Lymphatic: No cervical, supraclavicular or axillary adenopathy Lungs no rales or rhonchi, good excursion bilaterally Heart regular rate and rhythm, no murmur appreciated Abd soft, nontender, positive bowel sounds, no liver or spleen tip palpated on exam, no fluid wave MSK no focal spinal tenderness, no joint edema Neuro: non-focal, well-oriented, appropriate affect Breasts: Deferred   Lab Results  Component Value Date   WBC 3.0 (L) 05/04/2017   HGB 11.8 (L) 05/04/2017   HCT 35.5 (L) 05/04/2017   MCV 80 (L) 05/04/2017   PLT 140 (L) 05/04/2017   Lab Results  Component Value Date   FERRITIN 233 04/13/2017   IRON 104 04/13/2017   TIBC 314 04/13/2017   UIBC 210 04/13/2017   IRONPCTSAT 33 04/13/2017   Lab Results  Component Value Date   RBC 4.45 05/04/2017   No results found for: KPAFRELGTCHN, LAMBDASER, [facility] Lab Results  Component Value Date   IGA 232 01/25/2015   No results found for: Odetta Pink, SPEI   Chemistry      Component Value Date/Time   NA 134 05/04/2017 0925   K 4.5 05/04/2017 0925   CL 97 (L) 05/04/2017 0925   CO2 23 05/04/2017 0925   BUN 28 (H) 05/04/2017 0925   CREATININE 1.2 05/04/2017 0925   GLU 86 03/31/2016      Component Value Date/Time   CALCIUM 9.3 05/04/2017 0925   ALKPHOS 58 05/04/2017 0925   AST 42 (H) 05/04/2017 0925    ALT 90 (H) 05/04/2017 0925   BILITOT 0.60 05/04/2017 0925      Impression and Plan: [name] is a very pleasant 62 yo caucasian gentleman with infiltrating high grade papillary urothelial carcinoma with right nephrectomy in October 2018. He continues to do well with treatment and we will proceed with cycle 3 today as planned.  He will let us know if the intermittent ringing in his ears becomes more frequent.  He will try Zofran and compazine for nausea as needed.  We will get him a new treatment and appointment schedule today and plan to see him back in another 3 weeks.  We will repeat a PET scan once  he has completed cycle 6.   He will contact our office with any questions or concerns. We can certainly see him sooner if need be.   Laverna Peace, NP 1/7/201910:05 AM

## 2017-05-18 NOTE — Patient Instructions (Signed)
[facility] Discharge Instructions for Patients Receiving Chemotherapy  Today you received the following chemotherapy agents Gemzar/Cisplatin To help prevent nausea and vomiting after your treatment, we encourage you to take your nausea medication as prescribed.  If you develop nausea and vomiting that is not controlled by your nausea medication, call the clinic.   BELOW ARE SYMPTOMS THAT SHOULD BE REPORTED IMMEDIATELY:  *FEVER GREATER THAN 100.5 F  *CHILLS WITH OR WITHOUT FEVER  NAUSEA AND VOMITING THAT IS NOT CONTROLLED WITH YOUR NAUSEA MEDICATION  *UNUSUAL SHORTNESS OF BREATH  *UNUSUAL BRUISING OR BLEEDING  TENDERNESS IN MOUTH AND THROAT WITH OR WITHOUT PRESENCE OF ULCERS  *URINARY PROBLEMS  *BOWEL PROBLEMS  UNUSUAL RASH Items with * indicate a potential emergency and should be followed up as soon as possible.  Feel free to call the clinic should you have any questions or concerns. The clinic phone number is [phone].  Please show the CHEMO ALERT CARD at check-in to the Emergency Department and triage nurse.     

## 2017-05-18 NOTE — Patient Instructions (Signed)
Implanted Port Home Guide An implanted port is a type of central line that is placed under the skin. Central lines are used to provide IV access when treatment or nutrition needs to be given through a person's veins. Implanted ports are used for long-term IV access. An implanted port may be placed because:  You need IV medicine that would be irritating to the small veins in your hands or arms.  You need long-term IV medicines, such as antibiotics.  You need IV nutrition for a long period.  You need frequent blood draws for lab tests.  You need dialysis.  Implanted ports are usually placed in the chest area, but they can also be placed in the upper arm, the abdomen, or the leg. An implanted port has two main parts:  Reservoir. The reservoir is round and will appear as a small, raised area under your skin. The reservoir is the part where a needle is inserted to give medicines or draw blood.  Catheter. The catheter is a thin, flexible tube that extends from the reservoir. The catheter is placed into a large vein. Medicine that is inserted into the reservoir goes into the catheter and then into the vein.  How will I care for my incision site? Do not get the incision site wet. Bathe or shower as directed by your health care provider. How is my port accessed? Special steps must be taken to access the port:  Before the port is accessed, a numbing cream can be placed on the skin. This helps numb the skin over the port site.  Your health care provider uses a sterile technique to access the port. ? Your health care provider must put on a mask and sterile gloves. ? The skin over your port is cleaned carefully with an antiseptic and allowed to dry. ? The port is gently pinched between sterile gloves, and a needle is inserted into the port.  Only "non-coring" port needles should be used to access the port. Once the port is accessed, a blood return should be checked. This helps ensure that the port  is in the vein and is not clogged.  If your port needs to remain accessed for a constant infusion, a clear (transparent) bandage will be placed over the needle site. The bandage and needle will need to be changed every week, or as directed by your health care provider.  Keep the bandage covering the needle clean and dry. Do not get it wet. Follow your health care provider's instructions on how to take a shower or bath while the port is accessed.  If your port does not need to stay accessed, no bandage is needed over the port.  What is flushing? Flushing helps keep the port from getting clogged. Follow your health care provider's instructions on how and when to flush the port. Ports are usually flushed with saline solution or a medicine called heparin. The need for flushing will depend on how the port is used.  If the port is used for intermittent medicines or blood draws, the port will need to be flushed: ? After medicines have been given. ? After blood has been drawn. ? As part of routine maintenance.  If a constant infusion is running, the port may not need to be flushed.  How long will my port stay implanted? The port can stay in for as long as your health care provider thinks it is needed. When it is time for the port to come out, surgery will be   done to remove it. The procedure is similar to the one performed when the port was put in. When should I seek immediate medical care? When you have an implanted port, you should seek immediate medical care if:  You notice a bad smell coming from the incision site.  You have swelling, redness, or drainage at the incision site.  You have more swelling or pain at the port site or the surrounding area.  You have a fever that is not controlled with medicine.  This information is not intended to replace advice given to you by your health care provider. Make sure you discuss any questions you have with your health care provider. Document  Released: 04/28/2005 Document Revised: 10/04/2015 Document Reviewed: 01/03/2013 Elsevier Interactive Patient Education  2017 Elsevier Inc.  

## 2017-05-18 NOTE — Addendum Note (Signed)
Addended by: Burney Gauze R on: 05/18/2017 12:18 PM   Modules accepted: Orders

## 2017-05-18 NOTE — Progress Notes (Signed)
Ok to treat with ANC 1.1 per Laverna Peace, NP.  Ok to administer post Cisplatin hydration over 1 hour per Trixie Rude, PhD, and Laverna Peace, NP.

## 2017-05-22 ENCOUNTER — Other Ambulatory Visit: Payer: Self-pay | Admitting: *Deleted

## 2017-05-22 DIAGNOSIS — C676 Malignant neoplasm of ureteric orifice: Secondary | ICD-10-CM

## 2017-05-25 ENCOUNTER — Inpatient Hospital Stay: Payer: Medicare Other

## 2017-05-25 ENCOUNTER — Other Ambulatory Visit: Payer: Self-pay | Admitting: Family

## 2017-05-25 DIAGNOSIS — C676 Malignant neoplasm of ureteric orifice: Secondary | ICD-10-CM

## 2017-05-25 DIAGNOSIS — D508 Other iron deficiency anemias: Secondary | ICD-10-CM

## 2017-05-25 DIAGNOSIS — Z95828 Presence of other vascular implants and grafts: Secondary | ICD-10-CM

## 2017-05-25 DIAGNOSIS — C651 Malignant neoplasm of right renal pelvis: Secondary | ICD-10-CM | POA: Diagnosis not present

## 2017-05-25 LAB — CMP (CANCER CENTER ONLY)
ALBUMIN: 3.8 g/dL (ref 3.5–5.0)
ALT: 90 U/L — ABNORMAL HIGH (ref 0–55)
AST: 59 U/L — AB (ref 5–34)
Alkaline Phosphatase: 70 U/L (ref 40–150)
Anion gap: 12 — ABNORMAL HIGH (ref 3–11)
BUN: 21 mg/dL (ref 7–26)
CHLORIDE: 101 mmol/L (ref 98–109)
CO2: 21 mmol/L — ABNORMAL LOW (ref 22–29)
Calcium: 9 mg/dL (ref 8.4–10.4)
Creatinine: 1.39 mg/dL — ABNORMAL HIGH (ref 0.70–1.30)
GFR, EST NON AFRICAN AMERICAN: 53 mL/min — AB (ref >60)
GFR, Est AFR Am: >60 mL/min (ref >60)
GLUCOSE: 192 mg/dL — AB (ref 70–140)
POTASSIUM: 4.6 mmol/L (ref 3.5–5.1)
SODIUM: 134 mmol/L — AB (ref 136–145)
Total Bilirubin: 0.3 mg/dL (ref 0.2–1.2)
Total Protein: 6.8 g/dL (ref 6.4–8.3)

## 2017-05-25 LAB — CBC WITH DIFFERENTIAL (CANCER CENTER ONLY)
Basophils Absolute: 0 10*3/uL (ref 0.0–0.1)
Basophils Relative: 1 %
EOS PCT: 2 %
Eosinophils Absolute: 0 10*3/uL (ref 0.0–0.5)
HCT: 32.8 % — ABNORMAL LOW (ref 38.7–49.9)
Hemoglobin: 10.9 g/dL — ABNORMAL LOW (ref 13.0–17.1)
LYMPHS ABS: 0.9 10*3/uL (ref 0.9–3.3)
LYMPHS PCT: 48 %
MCH: 27.5 pg — AB (ref 28.0–33.4)
MCHC: 33.2 g/dL (ref 32.0–35.9)
MCV: 82.8 fL (ref 82.0–98.0)
Monocytes Absolute: 0.2 10*3/uL (ref 0.1–0.9)
Monocytes Relative: 8 %
Neutro Abs: 0.8 10*3/uL — ABNORMAL LOW (ref 1.5–6.5)
Neutrophils Relative %: 41 %
PLATELETS: 229 10*3/uL (ref 140–400)
RBC: 3.96 MIL/uL — ABNORMAL LOW (ref 4.20–5.70)
RDW: 19 % — ABNORMAL HIGH (ref 11.1–15.7)
WBC: 1.9 10*3/uL — AB (ref 4.0–10.3)

## 2017-05-25 MED ORDER — SODIUM CHLORIDE 0.9% FLUSH
10.0000 mL | INTRAVENOUS | Status: DC | PRN
  Administered 2017-05-25: 10 mL via INTRAVENOUS
  Filled 2017-05-25: qty 10

## 2017-05-25 MED ORDER — HEPARIN SOD (PORK) LOCK FLUSH 100 UNIT/ML IV SOLN
500.0000 [IU] | Freq: Once | INTRAVENOUS | Status: AC
  Administered 2017-05-25: 500 [IU] via INTRAVENOUS
  Filled 2017-05-25: qty 5

## 2017-05-25 NOTE — Progress Notes (Signed)
Dr. [name] decided not to treat with [place] today. Patient educated about neutropenia and was educated how deferring chemo treatments works. He had no other questions prior to discharge.

## 2017-05-25 NOTE — Patient Instructions (Signed)
Neutropenia Neutropenia is a condition that occurs when you have a lower-than-normal level of a type of white blood cell (neutrophil) in your body. Neutrophils are made in the spongy center of large bones (bone marrow) and they fight infections. Neutrophils are your body's main defense against bacterial and fungal infections. The fewer neutrophils you have and the longer your body remains without them, the greater your risk of getting a severe infection. What are the causes? This condition can occur if your body uses up or destroys neutrophils faster than your bone marrow can make them. This problem may happen because of:  Bacterial or fungal infection.  Allergic disorders.  Reactions to some medicines.  Autoimmune disease.  An enlarged spleen.  This condition can also occur if your bone marrow does not produce enough neutrophils. This problem may be caused by:  Cancer.  Cancer treatments, such as radiation or chemotherapy.  Viral infections.  Medicines, such as phenytoin.  Vitamin B12 deficiency.  Diseases of the bone marrow.  Environmental toxins, such as insecticides.  What are the signs or symptoms? This condition does not usually cause symptoms. If symptoms are present, they are usually caused by an underlying infection. Symptoms of an infection may include:  Fever.  Chills.  Swollen glands.  Oral or anal ulcers.  Cough and shortness of breath.  Rash.  Skin infection.  Fatigue.  How is this diagnosed? Your health care provider may suspect neutropenia if you have:  A condition that may cause neutropenia.  Symptoms of infection, especially fever.  Frequent and unusual infections.  You will have a medical history and physical exam. Tests will also be done, such as:  A complete blood count (CBC).  A procedure to collect a sample of bone marrow for examination (bone marrow biopsy).  A chest X-ray.  A urine culture.  A blood culture.  How is this  treated? Treatment depends on the underlying cause and severity of your condition. Mild neutropenia may not require treatment. Treatment may include medicines, such as:  Antibiotic medicine given through an IV tube.  Antiviral medicines.  Antifungal medicines.  A medicine to increase neutrophil production (colony-stimulating factor). You may get this drug through an IV tube or by injection.  Steroids given through an IV tube.  If an underlying condition is causing neutropenia, you may need treatment for that condition. If medicines you are taking are causing neutropenia, your health care provider may have you stop taking those medicines. Follow these instructions at home: Medicines  Take over-the-counter and prescription medicines only as told by your health care provider.  Get a seasonal flu shot (influenza vaccine). Lifestyle  Do not eat unpasteurized foods.Do not eat unwashed raw fruits or vegetables.  Avoid exposure to groups of people or children.  Avoid being around people who are sick.  Avoid being around dirt or dust, such as in construction areas or gardens.  Do not provide direct care for pets. Avoid animal droppings. Do not clean litter boxes and bird cages. Hygiene   Bathe daily.  Clean the area between the genitals and the anus (perineal area) after you urinate or have a bowel movement. If you are male, wipe from front to back.  Brush your teeth with a soft toothbrush before and after meals.  Do not use a razor that has a blade. Use an electric razor to remove hair.  Wash your hands often. Make sure others who come in contact with you also wash their hands. If soap and water  are not available, use hand sanitizer. General instructions  Do not have sex unless your health care provider has approved.  Take actions to avoid cuts and burns. For example: ? Be cautious when you use knives. Always cut away from yourself. ? Keep knives in protective sheaths or  guards when not in use. ? Use oven mitts when you cook with a hot stove, oven, or grill. ? Stand a safe distance away from open fires.  Avoid people who received a vaccine in the past 30 days if that vaccine contained a live version of the germ (live vaccine). You should not get a live vaccine. Common live vaccines are varicella, measles, mumps, and rubella.  Do not share food utensils.  Do not use tampons, enemas, or rectal suppositories unless your health care provider has approved.  Keep all appointments as told by your health care provider. This is important. Contact a health care provider if:  You have a fever.  You have chills or you start to shake.  You have: ? A sore throat. ? A warm, red, or tender area on your skin. ? A cough. ? Frequent or painful urination. ? Vaginal discharge or itching.  You develop: ? Sores in your mouth or anus. ? Swollen lymph nodes. ? Red streaks on the skin. ? A rash.  You feel: ? Nauseous or you vomit. ? Very fatigued. ? Short of breath. This information is not intended to replace advice given to you by your health care provider. Make sure you discuss any questions you have with your health care provider. Document Released: 10/18/2001 Document Revised: 10/04/2015 Document Reviewed: 11/08/2014 Elsevier Interactive Patient Education  2018 [employer]vier Inc.  

## 2017-06-08 ENCOUNTER — Inpatient Hospital Stay: Payer: Medicare Other

## 2017-06-08 ENCOUNTER — Inpatient Hospital Stay (HOSPITAL_BASED_OUTPATIENT_CLINIC_OR_DEPARTMENT_OTHER): Payer: Medicare Other | Admitting: Family

## 2017-06-08 ENCOUNTER — Other Ambulatory Visit: Payer: Self-pay

## 2017-06-08 VITALS — BP 126/78 | HR 69 | Temp 98.0°F | Resp 20 | Wt 208.8 lb

## 2017-06-08 DIAGNOSIS — R42 Dizziness and giddiness: Secondary | ICD-10-CM | POA: Diagnosis not present

## 2017-06-08 DIAGNOSIS — Z5111 Encounter for antineoplastic chemotherapy: Secondary | ICD-10-CM

## 2017-06-08 DIAGNOSIS — Z79899 Other long term (current) drug therapy: Secondary | ICD-10-CM | POA: Diagnosis not present

## 2017-06-08 DIAGNOSIS — R110 Nausea: Secondary | ICD-10-CM

## 2017-06-08 DIAGNOSIS — R531 Weakness: Secondary | ICD-10-CM | POA: Diagnosis not present

## 2017-06-08 DIAGNOSIS — Z95828 Presence of other vascular implants and grafts: Secondary | ICD-10-CM

## 2017-06-08 DIAGNOSIS — D508 Other iron deficiency anemias: Secondary | ICD-10-CM

## 2017-06-08 DIAGNOSIS — Z794 Long term (current) use of insulin: Secondary | ICD-10-CM

## 2017-06-08 DIAGNOSIS — R112 Nausea with vomiting, unspecified: Secondary | ICD-10-CM

## 2017-06-08 DIAGNOSIS — C676 Malignant neoplasm of ureteric orifice: Secondary | ICD-10-CM

## 2017-06-08 DIAGNOSIS — C651 Malignant neoplasm of right renal pelvis: Secondary | ICD-10-CM | POA: Diagnosis not present

## 2017-06-08 DIAGNOSIS — E1165 Type 2 diabetes mellitus with hyperglycemia: Secondary | ICD-10-CM

## 2017-06-08 DIAGNOSIS — T451X5A Adverse effect of antineoplastic and immunosuppressive drugs, initial encounter: Secondary | ICD-10-CM

## 2017-06-08 DIAGNOSIS — F419 Anxiety disorder, unspecified: Secondary | ICD-10-CM

## 2017-06-08 DIAGNOSIS — R630 Anorexia: Secondary | ICD-10-CM

## 2017-06-08 LAB — CBC WITH DIFFERENTIAL (CANCER CENTER ONLY)
BASOS ABS: 0 10*3/uL (ref 0.0–0.1)
BASOS PCT: 1 %
Eosinophils Absolute: 0.1 10*3/uL (ref 0.0–0.5)
Eosinophils Relative: 3 %
HCT: 34.6 % — ABNORMAL LOW (ref 38.7–49.9)
Hemoglobin: 11.5 g/dL — ABNORMAL LOW (ref 13.0–17.1)
LYMPHS ABS: 1.1 10*3/uL (ref 0.9–3.3)
LYMPHS PCT: 34 %
MCH: 29 pg (ref 28.0–33.4)
MCHC: 33.2 g/dL (ref 32.0–35.9)
MCV: 87.4 fL (ref 82.0–98.0)
MONOS PCT: 21 %
Monocytes Absolute: 0.7 10*3/uL (ref 0.1–0.9)
NEUTROS PCT: 41 %
Neutro Abs: 1.4 10*3/uL — ABNORMAL LOW (ref 1.5–6.5)
PLATELETS: 149 10*3/uL (ref 140–400)
RBC: 3.96 MIL/uL — ABNORMAL LOW (ref 4.20–5.70)
RDW: 23.3 % — ABNORMAL HIGH (ref 11.1–15.7)
WBC: 3.3 10*3/uL — AB (ref 4.0–10.3)

## 2017-06-08 LAB — IRON AND TIBC
Iron: 76 ug/dL (ref 42–163)
Saturation Ratios: 23 % — ABNORMAL LOW (ref 42–163)
TIBC: 325 ug/dL (ref 202–409)
UIBC: 249 ug/dL

## 2017-06-08 LAB — CMP (CANCER CENTER ONLY)
ALBUMIN: 3.4 g/dL — AB (ref 3.5–5.0)
ALT: 51 U/L (ref 0–55)
AST: 47 U/L — AB (ref 5–34)
Alkaline Phosphatase: 65 U/L (ref 26–84)
Anion gap: 10 (ref 5–15)
BUN: 12 mg/dL (ref 7–22)
CO2: 25 mmol/L (ref 18–33)
CREATININE: 1.5 mg/dL — AB (ref 0.70–1.30)
Calcium: 8.9 mg/dL (ref 8.0–10.3)
Chloride: 104 mmol/L (ref 98–108)
GLUCOSE: 240 mg/dL — AB (ref 70–118)
Potassium: 4.3 mmol/L (ref 3.3–4.7)
Sodium: 139 mmol/L (ref 128–145)
Total Bilirubin: 0.7 mg/dL (ref 0.2–1.2)
Total Protein: 6.5 g/dL (ref 6.4–8.1)

## 2017-06-08 LAB — FERRITIN: Ferritin: 89 ng/mL (ref 22–316)

## 2017-06-08 LAB — RETICULOCYTES
RBC.: 4.03 MIL/uL — ABNORMAL LOW (ref 4.20–5.82)
RETIC CT PCT: 2.8 % — AB (ref 0.8–1.8)
Retic Count, Absolute: 112.8 10*3/uL — ABNORMAL HIGH (ref 34.8–93.9)

## 2017-06-08 LAB — LACTATE DEHYDROGENASE: LDH: 202 U/L (ref 125–245)

## 2017-06-08 MED ORDER — SODIUM CHLORIDE 0.9% FLUSH
10.0000 mL | INTRAVENOUS | Status: AC | PRN
  Administered 2017-06-08: 10 mL via INTRAVENOUS
  Filled 2017-06-08: qty 10

## 2017-06-08 MED ORDER — DRONABINOL 5 MG PO CAPS: 5.0000 mg | ORAL_CAPSULE | Freq: Two times a day (BID) | ORAL | 0 refills | Status: DC

## 2017-06-08 MED ORDER — HEPARIN SOD (PORK) LOCK FLUSH 100 UNIT/ML IV SOLN
500.0000 [IU] | Freq: Once | INTRAVENOUS | Status: AC
  Administered 2017-06-08: 500 [IU] via INTRAVENOUS
  Filled 2017-06-08: qty 5

## 2017-06-08 NOTE — Addendum Note (Signed)
Addended by: San Morelle on: 06/08/2017 12:03 PM   Modules accepted: Orders, SmartSet

## 2017-06-09 ENCOUNTER — Other Ambulatory Visit: Payer: Self-pay | Admitting: Family

## 2017-06-09 DIAGNOSIS — D509 Iron deficiency anemia, unspecified: Secondary | ICD-10-CM | POA: Insufficient documentation

## 2017-06-09 DIAGNOSIS — D508 Other iron deficiency anemias: Secondary | ICD-10-CM

## 2017-06-09 NOTE — Progress Notes (Signed)
Hematology and Oncology Follow Up Visit  Isaac [name] [id number] 1956-01-26 62 y.o. 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 is here today with a friend for follow-up. Unfortunately we had a scheduling issue and he will be coming back in next week on Monday for his 8 hour infusion. We would have treated this week but he was concerned with the weather and ice on the roads.  He is still having fatigued and loss of appetite.  He is not eating well but states he is hydrating. His weight is stable.  The ringing in his ears seems to be better but he still has the "swishing sound" off and on.  No bleeding, bruising or petechiae. No lymphadenopathy found on exam.  He denies fever, chills, n/v, cough, rash, dizziness, SOB, chest pain, palpitations, abdominal pain or changes in bowel or bladder habits.  No swelling, tenderness, numbness or tingling in his extremities. No c/o pain.  No falls or syncopal episodes.   ECOG Performance Status: 1 - Symptomatic but completely ambulatory  Medications:  Allergies as of 06/08/2017      Reactions   Dilaudid [hydromorphone] Nausea And Vomiting   Hydrocodone Itching, Nausea And Vomiting      Medication List        Accurate as of 06/08/17 11:59 PM. Always use your most recent med list.          amLODipine 10 MG tablet Commonly known as:  NORVASC Take 1 tablet (10 mg total) by mouth daily. Hold for SBP < 110   baclofen 10 MG tablet Commonly known as:  LIORESAL Take 0.5 tablets (5 mg total) by mouth every 12 (twelve) hours as needed for muscle spasms.   dexamethasone 4 MG tablet Commonly known as:  DECADRON Take 2 tablets by mouth once a day on the day after cisplatin chemotherapy and then take 2 tablets two times a day for  2 days. Take with food.   docusate sodium 100 MG capsule Commonly known as:  COLACE Take 1 capsule (100 mg total) by mouth 2 (two) times daily as needed (take to keep stool soft.).   dronabinol 5 MG capsule Commonly known as:  MARINOL Take 1 capsule (5 mg total) by mouth 2 (two) times daily before a meal.   glimepiride 2 MG tablet Commonly known as:  AMARYL TAKE 1 BY MOUTH DAILY BEFORE SUPPER   INSULIN GLARGINE [place] Inject 30 Units into the skin.   isosorbide mononitrate 120 MG 24 hr tablet Commonly known as:  IMDUR Take 1 tablet (120 mg total) by mouth daily.   lidocaine-prilocaine cream Commonly known as:  EMLA Apply to affected area once   LORazepam 0.5 MG tablet Commonly known as:  ATIVAN Take 1 tablet (0.5 mg total) every 6 (six) hours as needed by mouth (Nausea or vomiting).   metFORMIN 1000 MG tablet Commonly known as:  GLUCOPHAGE Take 1 tablet (1,000 mg total) by mouth 2 (two) times daily with a meal.   metoprolol succinate 25 MG 24 hr tablet Commonly known as:  TOPROL-XL Take 1 tablet (25 mg total) at bedtime by mouth.   nitroGLYCERIN 0.4 MG SL tablet Commonly known as:  NITROSTAT Place 1 tablet (0.4 mg total) under the tongue every 5 (five) minutes as needed for chest pain.   ondansetron 8 MG tablet Commonly  known as:  ZOFRAN Take 1 tablet (8 mg total) 2 (two) times daily as needed by mouth. Start on the third day after cisplatin chemotherapy.   pantoprazole 40 MG tablet Commonly known as:  PROTONIX Take 1 tablet (40 mg total) by mouth every morning.   prochlorperazine 10 MG tablet Commonly known as:  COMPAZINE Take 1 tablet (10 mg total) every 6 (six) hours as needed by mouth (Nausea or vomiting).   rOPINIRole 2 MG tablet Commonly known as:  REQUIP Take 1 tablet (2 mg total) by mouth at bedtime.   rosuvastatin 10 MG tablet Commonly known as:  CRESTOR TAKE 1 TAB (10 MG) BY MOUTH EVERY OTHER DAY       Allergies:  Allergies  Allergen Reactions    . Dilaudid [Hydromorphone] Nausea And Vomiting  . Hydrocodone Itching and Nausea And Vomiting    Past Medical History, Surgical history, Social history, and Family History were reviewed and updated.  Review of Systems: All other 10 point review of systems is negative.   Physical Exam:  weight is 208 lb 12 oz (94.7 kg). His oral temperature is 98 F (36.7 C). His blood pressure is 126/78 and his pulse is 69. His respiration is 20 and oxygen saturation is 99%.   Wt Readings from Last 3 Encounters:  06/08/17 208 lb 12 oz (94.7 kg)  05/18/17 206 lb (93.4 kg)  04/27/17 207 lb (93.9 kg)    Ocular: Sclerae unicteric, pupils equal, round and reactive to light Ear-nose-throat: Oropharynx clear, dentition fair Lymphatic: No cervical, supraclavicular or axillary adenopathy Lungs no rales or rhonchi, good excursion bilaterally Heart regular rate and rhythm, no murmur appreciated Abd soft, nontender, positive bowel sounds, no liver or spleen tip palpated on exam, no fluid wave  MSK no focal spinal tenderness, no joint edema Neuro: non-focal, well-oriented, appropriate affect Breasts: Deferred   Lab Results  Component Value Date   WBC 3.3 (L) 06/08/2017   HGB 11.8 (L) 05/04/2017   HCT 34.6 (L) 06/08/2017   MCV 87.4 06/08/2017   PLT 149 06/08/2017   Lab Results  Component Value Date   FERRITIN 89 06/08/2017   IRON 76 06/08/2017   TIBC 325 06/08/2017   UIBC 249 06/08/2017   IRONPCTSAT 23 (L) 06/08/2017   Lab Results  Component Value Date   RETICCTPCT 2.8 (H) 06/08/2017   RBC 4.03 (L) 06/08/2017   RBC 3.96 (L) 06/08/2017   No results found for: [facility] Lab Results  Component Value Date   IGA 232 01/25/2015   No results found for: Odetta Pink, SPEI   Chemistry      Component Value Date/Time   NA 139 06/08/2017 1105   NA 134 05/04/2017 0925   K 4.3 06/08/2017 1105   K 4.5 05/04/2017 0925    CL 104 06/08/2017 1105   CL 97 (L) 05/04/2017 0925   CO2 25 06/08/2017 1105   CO2 23 05/04/2017 0925   BUN 12 06/08/2017 1105   BUN 28 (H) 05/04/2017 0925   CREATININE 1.2 05/04/2017 0925   GLU 86 03/31/2016      Component Value Date/Time   CALCIUM 8.9 06/08/2017 1105   CALCIUM 9.3 05/04/2017 0925   ALKPHOS 65 06/08/2017 1105   ALKPHOS 58 05/04/2017 0925   AST 47 (H) 06/08/2017 1105   ALT 51 06/08/2017 1105   ALT 90 (H) 05/04/2017 0925   BILITOT 0.7 06/08/2017 1105      Impression and Plan:  [name] is a very pleasant 62 yo caucasian gentleman with infiltrating high grade papillary urothelial carcinoma with right nephrectomy in October 2018.  He is tolerating treatment nicely so far with minimal side effects.  We will have him try Marinol 5 mg BID to help boost his appetite.  He has his current treatment and appointment schedule and we will see him next week on Monday for day 1 of cycle 4.   Laverna Peace, NP 1/29/201910:16 AM

## 2017-06-10 ENCOUNTER — Ambulatory Visit: Payer: Medicare Other

## 2017-06-10 DIAGNOSIS — G4733 Obstructive sleep apnea (adult) (pediatric): Secondary | ICD-10-CM | POA: Diagnosis not present

## 2017-06-15 ENCOUNTER — Inpatient Hospital Stay: Payer: Medicare Other

## 2017-06-15 ENCOUNTER — Other Ambulatory Visit: Payer: Medicare Other

## 2017-06-15 ENCOUNTER — Inpatient Hospital Stay: Payer: Medicare Other | Attending: Hematology & Oncology

## 2017-06-15 ENCOUNTER — Ambulatory Visit: Payer: Medicare Other

## 2017-06-15 DIAGNOSIS — C676 Malignant neoplasm of ureteric orifice: Secondary | ICD-10-CM

## 2017-06-15 DIAGNOSIS — Z79899 Other long term (current) drug therapy: Secondary | ICD-10-CM | POA: Diagnosis not present

## 2017-06-15 DIAGNOSIS — R110 Nausea: Secondary | ICD-10-CM | POA: Insufficient documentation

## 2017-06-15 DIAGNOSIS — Z7984 Long term (current) use of oral hypoglycemic drugs: Secondary | ICD-10-CM | POA: Diagnosis not present

## 2017-06-15 DIAGNOSIS — C651 Malignant neoplasm of right renal pelvis: Secondary | ICD-10-CM

## 2017-06-15 DIAGNOSIS — D649 Anemia, unspecified: Secondary | ICD-10-CM | POA: Diagnosis not present

## 2017-06-15 DIAGNOSIS — H9313 Tinnitus, bilateral: Secondary | ICD-10-CM | POA: Insufficient documentation

## 2017-06-15 DIAGNOSIS — Z5111 Encounter for antineoplastic chemotherapy: Secondary | ICD-10-CM | POA: Diagnosis not present

## 2017-06-15 DIAGNOSIS — Z7982 Long term (current) use of aspirin: Secondary | ICD-10-CM | POA: Diagnosis not present

## 2017-06-15 DIAGNOSIS — E1165 Type 2 diabetes mellitus with hyperglycemia: Secondary | ICD-10-CM | POA: Diagnosis not present

## 2017-06-15 LAB — CBC WITH DIFFERENTIAL (CANCER CENTER ONLY)
Basophils Absolute: 0.1 10*3/uL (ref 0.0–0.1)
Basophils Relative: 1 %
Eosinophils Absolute: 0.2 10*3/uL (ref 0.0–0.5)
Eosinophils Relative: 4 %
HEMATOCRIT: 34.1 % — AB (ref 38.7–49.9)
Hemoglobin: 11.2 g/dL — ABNORMAL LOW (ref 13.0–17.1)
LYMPHS PCT: 30 %
Lymphs Abs: 1.2 10*3/uL (ref 0.9–3.3)
MCH: 29.1 pg (ref 28.0–33.4)
MCHC: 32.8 g/dL (ref 32.0–35.9)
MCV: 88.6 fL (ref 82.0–98.0)
MONO ABS: 0.7 10*3/uL (ref 0.1–0.9)
MONOS PCT: 17 %
NEUTROS ABS: 1.8 10*3/uL (ref 1.5–6.5)
Neutrophils Relative %: 48 %
Platelet Count: 112 10*3/uL — ABNORMAL LOW (ref 145–400)
RBC: 3.85 MIL/uL — ABNORMAL LOW (ref 4.20–5.70)
RDW: 22.7 % — AB (ref 11.1–15.7)
WBC Count: 3.8 10*3/uL — ABNORMAL LOW (ref 4.0–10.0)

## 2017-06-15 LAB — CMP (CANCER CENTER ONLY)
ALT: 42 U/L (ref 0–55)
ANION GAP: 7 (ref 5–15)
AST: 45 U/L — ABNORMAL HIGH (ref 5–34)
Albumin: 3.4 g/dL — ABNORMAL LOW (ref 3.5–5.0)
Alkaline Phosphatase: 65 U/L (ref 26–84)
BILIRUBIN TOTAL: 0.6 mg/dL (ref 0.2–1.2)
BUN: 17 mg/dL (ref 7–22)
CALCIUM: 9.3 mg/dL (ref 8.0–10.3)
CO2: 26 mmol/L (ref 18–33)
Chloride: 105 mmol/L (ref 98–108)
Creatinine: 0.9 mg/dL (ref 0.70–1.30)
Glucose, Bld: 128 mg/dL — ABNORMAL HIGH (ref 73–118)
Potassium: 3.9 mmol/L (ref 3.3–4.7)
Sodium: 138 mmol/L (ref 128–145)
Total Protein: 6.4 g/dL (ref 6.4–8.1)

## 2017-06-15 LAB — LACTATE DEHYDROGENASE: LDH: 209 U/L (ref 125–245)

## 2017-06-15 MED ORDER — HEPARIN SOD (PORK) LOCK FLUSH 100 UNIT/ML IV SOLN
500.0000 [IU] | Freq: Once | INTRAVENOUS | Status: AC | PRN
  Administered 2017-06-15: 500 [IU]
  Filled 2017-06-15: qty 5

## 2017-06-15 MED ORDER — POTASSIUM CHLORIDE 2 MEQ/ML IV SOLN
Freq: Once | INTRAVENOUS | Status: AC
  Administered 2017-06-15: 10:00:00 via INTRAVENOUS
  Filled 2017-06-15: qty 10

## 2017-06-15 MED ORDER — SODIUM CHLORIDE 0.9% FLUSH
10.0000 mL | INTRAVENOUS | Status: DC | PRN
  Administered 2017-06-15: 10 mL
  Filled 2017-06-15: qty 10

## 2017-06-15 MED ORDER — SODIUM CHLORIDE 0.9 % IV SOLN
Freq: Once | INTRAVENOUS | Status: AC
  Administered 2017-06-15: 12:00:00 via INTRAVENOUS
  Filled 2017-06-15: qty 5

## 2017-06-15 MED ORDER — SODIUM CHLORIDE 0.9 % IV SOLN
Freq: Once | INTRAVENOUS | Status: AC
Start: 2017-06-15 — End: 2017-06-15
  Administered 2017-06-15: 10:00:00 via INTRAVENOUS

## 2017-06-15 MED ORDER — PALONOSETRON HCL INJECTION 0.25 MG/5ML
0.2500 mg | Freq: Once | INTRAVENOUS | Status: AC
  Administered 2017-06-15: 0.25 mg via INTRAVENOUS

## 2017-06-15 MED ORDER — SODIUM CHLORIDE 0.9 % IV SOLN
70.5000 mg/m2 | Freq: Once | INTRAVENOUS | Status: AC
  Administered 2017-06-15: 150 mg via INTRAVENOUS
  Filled 2017-06-15: qty 100

## 2017-06-15 MED ORDER — PALONOSETRON HCL INJECTION 0.25 MG/5ML
INTRAVENOUS | Status: AC
  Filled 2017-06-15: qty 5

## 2017-06-15 MED ORDER — SODIUM CHLORIDE 0.9 % IV SOLN
800.0000 mg/m2 | Freq: Once | INTRAVENOUS | Status: AC
  Administered 2017-06-15: 1710 mg via INTRAVENOUS
  Filled 2017-06-15: qty 23.96

## 2017-06-15 NOTE — Patient Instructions (Signed)
Gemcitabine injection What is this medicine? GEMCITABINE (jem SIT a been) is a chemotherapy drug. This medicine is used to treat many types of cancer like breast cancer, lung cancer, pancreatic cancer, and ovarian cancer. This medicine may be used for other purposes; ask your health care provider or pharmacist if you have questions. COMMON BRAND NAME(S): Gemzar What should I tell my health care provider before I take this medicine? They need to know if you have any of these conditions: -blood disorders -infection -kidney disease -liver disease -recent or ongoing radiation therapy -an unusual or allergic reaction to gemcitabine, other chemotherapy, other medicines, foods, dyes, or preservatives -pregnant or trying to get pregnant -breast-feeding How should I use this medicine? This drug is given as an infusion into a vein. It is administered in a hospital or clinic by a specially trained health care professional. Talk to your pediatrician regarding the use of this medicine in children. Special care may be needed. Overdosage: If you think you have taken too much of this medicine contact a poison control center or emergency room at once. NOTE: This medicine is only for you. Do not share this medicine with others. What if I miss a dose? It is important not to miss your dose. Call your doctor or health care professional if you are unable to keep an appointment. What may interact with this medicine? -medicines to increase blood counts like filgrastim, pegfilgrastim, sargramostim -some other chemotherapy drugs like cisplatin -vaccines Talk to your doctor or health care professional before taking any of these medicines: -acetaminophen -aspirin -ibuprofen -ketoprofen -naproxen This list may not describe all possible interactions. Give your health care provider a list of all the medicines, herbs, non-prescription drugs, or dietary supplements you use. Also tell them if you smoke, drink alcohol,  or use illegal drugs. Some items may interact with your medicine. What should I watch for while using this medicine? Visit your doctor for checks on your progress. This drug may make you feel generally unwell. This is not uncommon, as chemotherapy can affect healthy cells as well as cancer cells. Report any side effects. Continue your course of treatment even though you feel ill unless your doctor tells you to stop. In some cases, you may be given additional medicines to help with side effects. Follow all directions for their use. Call your doctor or health care professional for advice if you get a fever, chills or sore throat, or other symptoms of a cold or flu. Do not treat yourself. This drug decreases your body's ability to fight infections. Try to avoid being around people who are sick. This medicine may increase your risk to bruise or bleed. Call your doctor or health care professional if you notice any unusual bleeding. Be careful brushing and flossing your teeth or using a toothpick because you may get an infection or bleed more easily. If you have any dental work done, tell your dentist you are receiving this medicine. Avoid taking products that contain aspirin, acetaminophen, ibuprofen, naproxen, or ketoprofen unless instructed by your doctor. These medicines may hide a fever. Women should inform their doctor if they wish to become pregnant or think they might be pregnant. There is a potential for serious side effects to an unborn child. Talk to your health care professional or pharmacist for more information. Do not breast-feed an infant while taking this medicine. What side effects may I notice from receiving this medicine? Side effects that you should report to your doctor or health care professional as   soon as possible: -allergic reactions like skin rash, itching or hives, swelling of the face, lips, or tongue -low blood counts - this medicine may decrease the number of white blood cells,  red blood cells and platelets. You may be at increased risk for infections and bleeding. -signs of infection - fever or chills, cough, sore throat, pain or difficulty passing urine -signs of decreased platelets or bleeding - bruising, pinpoint red spots on the skin, black, tarry stools, blood in the urine -signs of decreased red blood cells - unusually weak or tired, fainting spells, lightheadedness -breathing problems -chest pain -mouth sores -nausea and vomiting -pain, swelling, redness at site where injected -pain, tingling, numbness in the hands or feet -stomach pain -swelling of ankles, feet, hands -unusual bleeding Side effects that usually do not require medical attention (report to your doctor or health care professional if they continue or are bothersome): -constipation -diarrhea -hair loss -loss of appetite -stomach upset This list may not describe all possible side effects. Call your doctor for medical advice about side effects. You may report side effects to FDA at 1-800-FDA-1088. Where should I keep my medicine? This drug is given in a hospital or clinic and will not be stored at home. NOTE: This sheet is a summary. It may not cover all possible information. If you have questions about this medicine, talk to your doctor, pharmacist, or health care provider.  2018 Elsevier/Gold Standard (2007-09-07 18:45:54) Cisplatin injection What is this medicine? CISPLATIN (SIS pla tin) is a chemotherapy drug. It targets fast dividing cells, like cancer cells, and causes these cells to die. This medicine is used to treat many types of cancer like bladder, ovarian, and testicular cancers. This medicine may be used for other purposes; ask your health care provider or pharmacist if you have questions. COMMON BRAND NAME(S): Platinol, Platinol -AQ What should I tell my health care provider before I take this medicine? They need to know if you have any of these conditions: -blood  disorders -hearing problems -kidney disease -recent or ongoing radiation therapy -an unusual or allergic reaction to cisplatin, carboplatin, other chemotherapy, other medicines, foods, dyes, or preservatives -pregnant or trying to get pregnant -breast-feeding How should I use this medicine? This drug is given as an infusion into a vein. It is administered in a hospital or clinic by a specially trained health care professional. Talk to your pediatrician regarding the use of this medicine in children. Special care may be needed. Overdosage: If you think you have taken too much of this medicine contact a poison control center or emergency room at once. NOTE: This medicine is only for you. Do not share this medicine with others. What if I miss a dose? It is important not to miss a dose. Call your doctor or health care professional if you are unable to keep an appointment. What may interact with this medicine? -dofetilide -foscarnet -medicines for seizures -medicines to increase blood counts like filgrastim, pegfilgrastim, sargramostim -probenecid -pyridoxine used with altretamine -rituximab -some antibiotics like amikacin, gentamicin, neomycin, polymyxin B, streptomycin, tobramycin -sulfinpyrazone -vaccines -zalcitabine Talk to your doctor or health care professional before taking any of these medicines: -acetaminophen -aspirin -ibuprofen -ketoprofen -naproxen This list may not describe all possible interactions. Give your health care provider a list of all the medicines, herbs, non-prescription drugs, or dietary supplements you use. Also tell them if you smoke, drink alcohol, or use illegal drugs. Some items may interact with your medicine. What should I watch for while using this   medicine? Your condition will be monitored carefully while you are receiving this medicine. You will need important blood work done while you are taking this medicine. This drug may make you feel generally  unwell. This is not uncommon, as chemotherapy can affect healthy cells as well as cancer cells. Report any side effects. Continue your course of treatment even though you feel ill unless your doctor tells you to stop. In some cases, you may be given additional medicines to help with side effects. Follow all directions for their use. Call your doctor or health care professional for advice if you get a fever, chills or sore throat, or other symptoms of a cold or flu. Do not treat yourself. This drug decreases your body's ability to fight infections. Try to avoid being around people who are sick. This medicine may increase your risk to bruise or bleed. Call your doctor or health care professional if you notice any unusual bleeding. Be careful brushing and flossing your teeth or using a toothpick because you may get an infection or bleed more easily. If you have any dental work done, tell your dentist you are receiving this medicine. Avoid taking products that contain aspirin, acetaminophen, ibuprofen, naproxen, or ketoprofen unless instructed by your doctor. These medicines may hide a fever. Do not become pregnant while taking this medicine. Women should inform their doctor if they wish to become pregnant or think they might be pregnant. There is a potential for serious side effects to an unborn child. Talk to your health care professional or pharmacist for more information. Do not breast-feed an infant while taking this medicine. Drink fluids as directed while you are taking this medicine. This will help protect your kidneys. Call your doctor or health care professional if you get diarrhea. Do not treat yourself. What side effects may I notice from receiving this medicine? Side effects that you should report to your doctor or health care professional as soon as possible: -allergic reactions like skin rash, itching or hives, swelling of the face, lips, or tongue -signs of infection - fever or chills, cough,  sore throat, pain or difficulty passing urine -signs of decreased platelets or bleeding - bruising, pinpoint red spots on the skin, black, tarry stools, nosebleeds -signs of decreased red blood cells - unusually weak or tired, fainting spells, lightheadedness -breathing problems -changes in hearing -gout pain -low blood counts - This drug may decrease the number of white blood cells, red blood cells and platelets. You may be at increased risk for infections and bleeding. -nausea and vomiting -pain, swelling, redness or irritation at the injection site -pain, tingling, numbness in the hands or feet -problems with balance, movement -trouble passing urine or change in the amount of urine Side effects that usually do not require medical attention (report to your doctor or health care professional if they continue or are bothersome): -changes in vision -loss of appetite -metallic taste in the mouth or changes in taste This list may not describe all possible side effects. Call your doctor for medical advice about side effects. You may report side effects to FDA at 1-800-FDA-1088. Where should I keep my medicine? This drug is given in a hospital or clinic and will not be stored at home. NOTE: This sheet is a summary. It may not cover all possible information. If you have questions about this medicine, talk to your doctor, pharmacist, or health care provider.  2018 Elsevier/Gold Standard (2007-08-03 14:40:54)  

## 2017-06-15 NOTE — Patient Instructions (Signed)
Implanted Port Home Guide An implanted port is a type of central line that is placed under the skin. Central lines are used to provide IV access when treatment or nutrition needs to be given through a person's veins. Implanted ports are used for long-term IV access. An implanted port may be placed because:  You need IV medicine that would be irritating to the small veins in your hands or arms.  You need long-term IV medicines, such as antibiotics.  You need IV nutrition for a long period.  You need frequent blood draws for lab tests.  You need dialysis.  Implanted ports are usually placed in the chest area, but they can also be placed in the upper arm, the abdomen, or the leg. An implanted port has two main parts:  Reservoir. The reservoir is round and will appear as a small, raised area under your skin. The reservoir is the part where a needle is inserted to give medicines or draw blood.  Catheter. The catheter is a thin, flexible tube that extends from the reservoir. The catheter is placed into a large vein. Medicine that is inserted into the reservoir goes into the catheter and then into the vein.  How will I care for my incision site? Do not get the incision site wet. Bathe or shower as directed by your health care provider. How is my port accessed? Special steps must be taken to access the port:  Before the port is accessed, a numbing cream can be placed on the skin. This helps numb the skin over the port site.  Your health care provider uses a sterile technique to access the port. ? Your health care provider must put on a mask and sterile gloves. ? The skin over your port is cleaned carefully with an antiseptic and allowed to dry. ? The port is gently pinched between sterile gloves, and a needle is inserted into the port.  Only "non-coring" port needles should be used to access the port. Once the port is accessed, a blood return should be checked. This helps ensure that the port  is in the vein and is not clogged.  If your port needs to remain accessed for a constant infusion, a clear (transparent) bandage will be placed over the needle site. The bandage and needle will need to be changed every week, or as directed by your health care provider.  Keep the bandage covering the needle clean and dry. Do not get it wet. Follow your health care provider's instructions on how to take a shower or bath while the port is accessed.  If your port does not need to stay accessed, no bandage is needed over the port.  What is flushing? Flushing helps keep the port from getting clogged. Follow your health care provider's instructions on how and when to flush the port. Ports are usually flushed with saline solution or a medicine called heparin. The need for flushing will depend on how the port is used.  If the port is used for intermittent medicines or blood draws, the port will need to be flushed: ? After medicines have been given. ? After blood has been drawn. ? As part of routine maintenance.  If a constant infusion is running, the port may not need to be flushed.  How long will my port stay implanted? The port can stay in for as long as your health care provider thinks it is needed. When it is time for the port to come out, surgery will be   done to remove it. The procedure is similar to the one performed when the port was put in. When should I seek immediate medical care? When you have an implanted port, you should seek immediate medical care if:  You notice a bad smell coming from the incision site.  You have swelling, redness, or drainage at the incision site.  You have more swelling or pain at the port site or the surrounding area.  You have a fever that is not controlled with medicine.  This information is not intended to replace advice given to you by your health care provider. Make sure you discuss any questions you have with your health care provider. Document  Released: 04/28/2005 Document Revised: 10/04/2015 Document Reviewed: 01/03/2013 Elsevier Interactive Patient Education  2017 Elsevier Inc.  

## 2017-06-17 ENCOUNTER — Ambulatory Visit: Payer: Medicare Other

## 2017-06-17 ENCOUNTER — Other Ambulatory Visit: Payer: Medicare Other

## 2017-06-18 DIAGNOSIS — E1165 Type 2 diabetes mellitus with hyperglycemia: Secondary | ICD-10-CM | POA: Diagnosis not present

## 2017-06-18 DIAGNOSIS — Z794 Long term (current) use of insulin: Secondary | ICD-10-CM | POA: Diagnosis not present

## 2017-06-19 ENCOUNTER — Other Ambulatory Visit: Payer: Self-pay | Admitting: *Deleted

## 2017-06-19 DIAGNOSIS — C651 Malignant neoplasm of right renal pelvis: Secondary | ICD-10-CM

## 2017-06-22 ENCOUNTER — Inpatient Hospital Stay: Payer: Medicare Other

## 2017-06-22 ENCOUNTER — Other Ambulatory Visit: Payer: Self-pay | Admitting: Family

## 2017-06-22 DIAGNOSIS — C651 Malignant neoplasm of right renal pelvis: Secondary | ICD-10-CM

## 2017-06-22 DIAGNOSIS — R112 Nausea with vomiting, unspecified: Secondary | ICD-10-CM

## 2017-06-22 LAB — COMPREHENSIVE METABOLIC PANEL
ALBUMIN: 3.6 g/dL (ref 3.5–5.0)
ALK PHOS: 67 U/L (ref 26–84)
ALT: 98 U/L — AB (ref 10–47)
AST: 69 U/L — ABNORMAL HIGH (ref 11–38)
Anion gap: 14 (ref 5–15)
BUN: 22 mg/dL (ref 7–22)
CO2: 24 mmol/L (ref 18–33)
CREATININE: 1.3 mg/dL — AB (ref 0.60–1.20)
Calcium: 8.7 mg/dL (ref 8.0–10.3)
Chloride: 99 mmol/L (ref 98–108)
Glucose, Bld: 245 mg/dL — ABNORMAL HIGH (ref 73–118)
Potassium: 4.4 mmol/L (ref 3.3–4.7)
SODIUM: 137 mmol/L (ref 128–145)
TOTAL PROTEIN: 6.4 g/dL (ref 6.4–8.1)
Total Bilirubin: 0.7 mg/dL (ref 0.2–1.6)

## 2017-06-22 LAB — CBC WITH DIFFERENTIAL (CANCER CENTER ONLY)
BASOS ABS: 0 10*3/uL (ref 0.0–0.1)
Basophils Relative: 1 %
EOS PCT: 3 %
Eosinophils Absolute: 0.1 10*3/uL (ref 0.0–0.5)
HCT: 33.8 % — ABNORMAL LOW (ref 38.7–49.9)
Hemoglobin: 11 g/dL — ABNORMAL LOW (ref 13.0–17.1)
Lymphocytes Relative: 44 %
Lymphs Abs: 0.9 10*3/uL (ref 0.9–3.3)
MCH: 28.9 pg (ref 28.0–33.4)
MCHC: 32.5 g/dL (ref 32.0–35.9)
MCV: 88.9 fL (ref 82.0–98.0)
MONO ABS: 0.1 10*3/uL (ref 0.1–0.9)
Monocytes Relative: 7 %
Neutro Abs: 0.9 10*3/uL — ABNORMAL LOW (ref 1.5–6.5)
Neutrophils Relative %: 45 %
PLATELETS: 71 10*3/uL — AB (ref 145–400)
RBC: 3.8 MIL/uL — AB (ref 4.20–5.70)
RDW: 20.7 % — AB (ref 11.1–15.7)
WBC: 2 10*3/uL — AB (ref 4.0–10.0)

## 2017-06-22 MED ORDER — LORAZEPAM 2 MG/ML IJ SOLN
0.5000 mg | Freq: Once | INTRAMUSCULAR | Status: AC
  Administered 2017-06-22: 0.5 mg via INTRAVENOUS

## 2017-06-22 MED ORDER — LORAZEPAM 2 MG/ML IJ SOLN
INTRAMUSCULAR | Status: AC
  Filled 2017-06-22: qty 1

## 2017-06-22 MED ORDER — ONDANSETRON HCL 4 MG/2ML IJ SOLN
INTRAMUSCULAR | Status: AC
  Filled 2017-06-22: qty 4

## 2017-06-22 MED ORDER — ONDANSETRON HCL 4 MG/2ML IJ SOLN
8.0000 mg | Freq: Once | INTRAMUSCULAR | Status: AC
  Administered 2017-06-22: 8 mg via INTRAVENOUS

## 2017-06-22 MED ORDER — SODIUM CHLORIDE 0.9 % IV SOLN: Freq: Once | INTRAVENOUS | Status: DC

## 2017-06-22 MED ORDER — HEPARIN SOD (PORK) LOCK FLUSH 100 UNIT/ML IV SOLN
500.0000 [IU] | Freq: Once | INTRAVENOUS | Status: AC | PRN
  Administered 2017-06-22: 500 [IU]
  Filled 2017-06-22: qty 5

## 2017-06-22 MED ORDER — SODIUM CHLORIDE 0.9% FLUSH
10.0000 mL | INTRAVENOUS | Status: DC | PRN
  Administered 2017-06-22: 10 mL
  Filled 2017-06-22: qty 10

## 2017-06-22 MED ORDER — LORAZEPAM 2 MG/ML IJ SOLN: 0.5000 mg | Freq: Once | INTRAMUSCULAR | Status: AC

## 2017-06-22 MED ORDER — SODIUM CHLORIDE 0.9 % IV SOLN
800.0000 mg/m2 | Freq: Once | INTRAVENOUS | Status: AC
  Administered 2017-06-22: 1710 mg via INTRAVENOUS
  Filled 2017-06-22: qty 21.03

## 2017-06-22 MED ORDER — PROCHLORPERAZINE MALEATE 10 MG PO TABS: 10.0000 mg | ORAL_TABLET | Freq: Once | ORAL | Status: DC

## 2017-06-22 MED ORDER — SODIUM CHLORIDE 0.9 % IV SOLN
Freq: Once | INTRAVENOUS | Status: AC
  Administered 2017-06-22: 10:00:00 via INTRAVENOUS

## 2017-06-22 NOTE — Progress Notes (Signed)
OK to treat today per Dr. [name]. LAbs reviewed as well as patient status with nausea.  Ok to treat per  Dr. Beatris Si

## 2017-06-22 NOTE — Patient Instructions (Signed)
[facility] Discharge Instructions for Patients Receiving Chemotherapy  Today you received the following chemotherapy agents Gemcitabine   To help prevent nausea and vomiting after your treatment, we encourage you to take your nausea medication    If you develop nausea and vomiting that is not controlled by your nausea medication, call the clinic.   BELOW ARE SYMPTOMS THAT SHOULD BE REPORTED IMMEDIATELY:  *FEVER GREATER THAN 100.5 F  *CHILLS WITH OR WITHOUT FEVER  NAUSEA AND VOMITING THAT IS NOT CONTROLLED WITH YOUR NAUSEA MEDICATION  *UNUSUAL SHORTNESS OF BREATH  *UNUSUAL BRUISING OR BLEEDING  TENDERNESS IN MOUTH AND THROAT WITH OR WITHOUT PRESENCE OF ULCERS  *URINARY PROBLEMS  *BOWEL PROBLEMS  UNUSUAL RASH Items with * indicate a potential emergency and should be followed up as soon as possible.  Feel free to call the clinic should you have any questions or concerns. The clinic phone number is [phone].  Please show the CHEMO ALERT CARD at check-in to the Emergency Department and triage nurse.   

## 2017-06-29 ENCOUNTER — Ambulatory Visit: Payer: Medicare Other | Admitting: Hematology & Oncology

## 2017-06-29 ENCOUNTER — Ambulatory Visit: Payer: Medicare Other

## 2017-06-29 ENCOUNTER — Other Ambulatory Visit: Payer: Medicare Other

## 2017-07-06 ENCOUNTER — Inpatient Hospital Stay: Payer: Medicare Other

## 2017-07-06 ENCOUNTER — Other Ambulatory Visit: Payer: Self-pay

## 2017-07-06 ENCOUNTER — Other Ambulatory Visit: Payer: Self-pay | Admitting: *Deleted

## 2017-07-06 ENCOUNTER — Encounter: Payer: Self-pay | Admitting: Family

## 2017-07-06 ENCOUNTER — Other Ambulatory Visit: Payer: Medicare Other

## 2017-07-06 ENCOUNTER — Inpatient Hospital Stay (HOSPITAL_BASED_OUTPATIENT_CLINIC_OR_DEPARTMENT_OTHER): Payer: Medicare Other | Admitting: Hematology & Oncology

## 2017-07-06 ENCOUNTER — Ambulatory Visit: Payer: Medicare Other

## 2017-07-06 VITALS — BP 125/62 | HR 77 | Temp 98.2°F | Resp 20 | Wt 206.0 lb

## 2017-07-06 DIAGNOSIS — C651 Malignant neoplasm of right renal pelvis: Secondary | ICD-10-CM

## 2017-07-06 DIAGNOSIS — Z7982 Long term (current) use of aspirin: Secondary | ICD-10-CM | POA: Diagnosis not present

## 2017-07-06 DIAGNOSIS — R112 Nausea with vomiting, unspecified: Secondary | ICD-10-CM

## 2017-07-06 DIAGNOSIS — H9313 Tinnitus, bilateral: Secondary | ICD-10-CM | POA: Diagnosis not present

## 2017-07-06 DIAGNOSIS — Z7984 Long term (current) use of oral hypoglycemic drugs: Secondary | ICD-10-CM | POA: Diagnosis not present

## 2017-07-06 DIAGNOSIS — Z5111 Encounter for antineoplastic chemotherapy: Secondary | ICD-10-CM

## 2017-07-06 DIAGNOSIS — E1165 Type 2 diabetes mellitus with hyperglycemia: Secondary | ICD-10-CM

## 2017-07-06 DIAGNOSIS — Z79899 Other long term (current) drug therapy: Secondary | ICD-10-CM | POA: Diagnosis not present

## 2017-07-06 DIAGNOSIS — T451X5A Adverse effect of antineoplastic and immunosuppressive drugs, initial encounter: Secondary | ICD-10-CM

## 2017-07-06 DIAGNOSIS — D508 Other iron deficiency anemias: Secondary | ICD-10-CM

## 2017-07-06 DIAGNOSIS — R110 Nausea: Secondary | ICD-10-CM

## 2017-07-06 DIAGNOSIS — N186 End stage renal disease: Secondary | ICD-10-CM

## 2017-07-06 DIAGNOSIS — D631 Anemia in chronic kidney disease: Secondary | ICD-10-CM

## 2017-07-06 LAB — RETICULOCYTES
RBC.: 3.27 MIL/uL — ABNORMAL LOW (ref 4.20–5.82)
RETIC CT PCT: 4.9 % — AB (ref 0.8–1.8)
Retic Count, Absolute: 160.2 10*3/uL — ABNORMAL HIGH (ref 34.8–93.9)

## 2017-07-06 LAB — CMP (CANCER CENTER ONLY)
ALBUMIN: 3.5 g/dL (ref 3.5–5.0)
ALT: 56 U/L — AB (ref 10–47)
AST: 55 U/L — AB (ref 11–38)
Alkaline Phosphatase: 79 U/L (ref 26–84)
Anion gap: 8 (ref 5–15)
BILIRUBIN TOTAL: 0.6 mg/dL (ref 0.2–1.6)
BUN: 14 mg/dL (ref 7–22)
CALCIUM: 9 mg/dL (ref 8.0–10.3)
CHLORIDE: 104 mmol/L (ref 98–108)
CO2: 25 mmol/L (ref 18–33)
CREATININE: 1.5 mg/dL — AB (ref 0.60–1.20)
Glucose, Bld: 311 mg/dL — ABNORMAL HIGH (ref 73–118)
Potassium: 4.1 mmol/L (ref 3.3–4.7)
SODIUM: 137 mmol/L (ref 128–145)
TOTAL PROTEIN: 6.4 g/dL (ref 6.4–8.1)

## 2017-07-06 LAB — CBC WITH DIFFERENTIAL (CANCER CENTER ONLY)
Basophils Absolute: 0 10*3/uL (ref 0.0–0.1)
Basophils Relative: 1 %
EOS PCT: 4 %
Eosinophils Absolute: 0.1 10*3/uL (ref 0.0–0.5)
HCT: 29.4 % — ABNORMAL LOW (ref 38.7–49.9)
Hemoglobin: 9.9 g/dL — ABNORMAL LOW (ref 13.0–17.1)
LYMPHS ABS: 0.9 10*3/uL (ref 0.9–3.3)
LYMPHS PCT: 35 %
MCH: 31.2 pg (ref 28.0–33.4)
MCHC: 33.7 g/dL (ref 32.0–35.9)
MCV: 92.7 fL (ref 82.0–98.0)
MONOS PCT: 18 %
Monocytes Absolute: 0.4 10*3/uL (ref 0.1–0.9)
Neutro Abs: 1.1 10*3/uL — ABNORMAL LOW (ref 1.5–6.5)
Neutrophils Relative %: 42 %
PLATELETS: 317 10*3/uL (ref 145–400)
RBC: 3.17 MIL/uL — AB (ref 4.20–5.70)
RDW: 21.4 % — ABNORMAL HIGH (ref 11.1–15.7)
WBC: 2.5 10*3/uL — AB (ref 4.0–10.0)

## 2017-07-06 LAB — IRON AND TIBC
Iron: 71 ug/dL (ref 42–163)
Saturation Ratios: 23 % — ABNORMAL LOW (ref 42–163)
TIBC: 301 ug/dL (ref 202–409)
UIBC: 231 ug/dL

## 2017-07-06 LAB — LACTATE DEHYDROGENASE: LDH: 207 U/L (ref 125–245)

## 2017-07-06 LAB — FERRITIN: FERRITIN: 198 ng/mL (ref 22–316)

## 2017-07-06 MED ORDER — SODIUM CHLORIDE 0.9 % IV SOLN
70.5000 mg/m2 | Freq: Once | INTRAVENOUS | Status: AC
  Administered 2017-07-06: 150 mg via INTRAVENOUS
  Filled 2017-07-06: qty 50

## 2017-07-06 MED ORDER — MEGESTROL ACETATE 400 MG/10ML PO SUSP: 400.0000 mg | Freq: Two times a day (BID) | ORAL | 3 refills | Status: DC

## 2017-07-06 MED ORDER — ALPRAZOLAM 0.5 MG PO TABS: 0.5000 mg | ORAL_TABLET | Freq: Three times a day (TID) | ORAL | 1 refills | Status: DC | PRN

## 2017-07-06 MED ORDER — SODIUM CHLORIDE 0.9 % IV SOLN
Freq: Once | INTRAVENOUS | Status: AC
  Administered 2017-07-06: 10:00:00 via INTRAVENOUS

## 2017-07-06 MED ORDER — SODIUM CHLORIDE 0.9 % IV SOLN
Freq: Once | INTRAVENOUS | Status: AC
  Administered 2017-07-06: 12:00:00 via INTRAVENOUS
  Filled 2017-07-06: qty 5

## 2017-07-06 MED ORDER — PALONOSETRON HCL INJECTION 0.25 MG/5ML
INTRAVENOUS | Status: AC
  Filled 2017-07-06: qty 5

## 2017-07-06 MED ORDER — OLANZAPINE 10 MG PO TABS: 10.0000 mg | ORAL_TABLET | Freq: Every day | ORAL | 4 refills | Status: DC

## 2017-07-06 MED ORDER — SODIUM CHLORIDE 0.9% FLUSH
10.0000 mL | INTRAVENOUS | Status: DC | PRN
  Administered 2017-07-06: 10 mL
  Filled 2017-07-06: qty 10

## 2017-07-06 MED ORDER — GEMCITABINE HCL CHEMO INJECTION 1 GM/26.3ML
800.0000 mg/m2 | Freq: Once | INTRAVENOUS | Status: AC
  Administered 2017-07-06: 1710 mg via INTRAVENOUS
  Filled 2017-07-06: qty 23.94

## 2017-07-06 MED ORDER — HEPARIN SOD (PORK) LOCK FLUSH 100 UNIT/ML IV SOLN
500.0000 [IU] | Freq: Once | INTRAVENOUS | Status: AC | PRN
  Administered 2017-07-06: 500 [IU]
  Filled 2017-07-06: qty 5

## 2017-07-06 MED ORDER — PALONOSETRON HCL INJECTION 0.25 MG/5ML
0.2500 mg | Freq: Once | INTRAVENOUS | Status: AC
  Administered 2017-07-06: 0.25 mg via INTRAVENOUS

## 2017-07-06 MED ORDER — POTASSIUM CHLORIDE 2 MEQ/ML IV SOLN
Freq: Once | INTRAVENOUS | Status: AC
  Administered 2017-07-06: 10:00:00 via INTRAVENOUS
  Filled 2017-07-06: qty 10

## 2017-07-06 MED FILL — ALPRAZolam 0.5 MG TABS: 0.5 | 14 days supply | Qty: 40 | Fill #0 | Status: TO

## 2017-07-06 MED FILL — OLANZapine 10 MG TABS: 10 | 30 days supply | Qty: 30 | Fill #0

## 2017-07-06 MED FILL — MEGESTROL ACET 40 MG/ML SUS: 40 | 24 days supply | Qty: 480 | Fill #0

## 2017-07-06 NOTE — Progress Notes (Signed)
Hematology and Oncology Follow Up Visit  Isaac [name] [id number] December 28, 1955 62 y.o. 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 is here today for follow-up.  His main complaint has been some nausea.  I will try him on some Zyprexa.  We will see if this can help with the nausea.  He does have some ringing in the ears.  I think this might be from his chemotherapy.  He might briefly anemia.  He has had no fever.  He has had no bleeding.  Is had no diarrhea.  Says that his taste for food has gone away.  I told him to try some water baking soda and gargle with this 6 times a day.  He does have diabetes.  His blood sugars do tend to run high.  He has had no leg swelling.  He has had no rashes.  Overall, his performance status is ECOG 1.  Medications:  Allergies as of 07/06/2017      Reactions   Dilaudid [hydromorphone] Nausea And Vomiting   Hydrocodone Itching, Nausea And Vomiting      Medication List        Accurate as of 07/06/17  9:01 AM. Always use your most recent med list.          amLODipine 10 MG tablet Commonly known as:  NORVASC Take 1 tablet (10 mg total) by mouth daily. Hold for SBP < 110   baclofen 10 MG tablet Commonly known as:  LIORESAL Take 0.5 tablets (5 mg total) by mouth every 12 (twelve) hours as needed for muscle spasms.   dexamethasone 4 MG tablet Commonly known as:  DECADRON Take 2 tablets by mouth once a day on the day after cisplatin chemotherapy and then take 2 tablets two times a day for 2 days. Take with food.   docusate sodium 100 MG capsule Commonly known as:  COLACE Take 1 capsule (100 mg total) by mouth 2 (two) times daily as needed (take to keep stool soft.).   dronabinol 5 MG capsule Commonly known as:   MARINOL Take 1 capsule (5 mg total) by mouth 2 (two) times daily before a meal.   glimepiride 2 MG tablet Commonly known as:  AMARYL TAKE 1 BY MOUTH DAILY BEFORE SUPPER   INSULIN GLARGINE [place] Inject 30 Units into the skin.   isosorbide mononitrate 120 MG 24 hr tablet Commonly known as:  IMDUR Take 1 tablet (120 mg total) by mouth daily.   lidocaine-prilocaine cream Commonly known as:  EMLA Apply to affected area once   LORazepam 0.5 MG tablet Commonly known as:  ATIVAN Take 1 tablet (0.5 mg total) every 6 (six) hours as needed by mouth (Nausea or vomiting).   metFORMIN 1000 MG tablet Commonly known as:  GLUCOPHAGE Take 1 tablet (1,000 mg total) by mouth 2 (two) times daily with a meal.   metoprolol succinate 25 MG 24 hr tablet Commonly known as:  TOPROL-XL Take 1 tablet (25 mg total) at bedtime by mouth.   nitroGLYCERIN 0.4 MG SL tablet Commonly known as:  NITROSTAT Place 1 tablet (0.4 mg total) under the tongue every 5 (five) minutes as needed for chest pain.   ondansetron 8 MG tablet Commonly known as:  ZOFRAN Take 1 tablet (8 mg total) 2 (two) times daily as needed by mouth. Start on  the third day after cisplatin chemotherapy.   pantoprazole 40 MG tablet Commonly known as:  PROTONIX Take 1 tablet (40 mg total) by mouth every morning.   Pen Needles 32G X 4 MM Misc by Does not apply route.   BD PEN NEEDLE NANO U/F 32G X 4 MM Misc Generic drug:  Insulin Pen Needle U ONCE D   prochlorperazine 10 MG tablet Commonly known as:  COMPAZINE Take 1 tablet (10 mg total) every 6 (six) hours as needed by mouth (Nausea or vomiting).   rOPINIRole 2 MG tablet Commonly known as:  REQUIP Take 1 tablet (2 mg total) by mouth at bedtime.   rosuvastatin 10 MG tablet Commonly known as:  CRESTOR TAKE 1 TAB (10 MG) BY MOUTH EVERY OTHER DAY       Allergies:  Allergies  Allergen Reactions  . Dilaudid [Hydromorphone] Nausea And Vomiting  . Hydrocodone Itching and Nausea And  Vomiting    Past Medical History, Surgical history, Social history, and Family History were reviewed and updated.  Review of Systems: Review of Systems  Constitutional: Negative.   HENT: Positive for tinnitus.   Eyes: Negative.   Respiratory: Negative.   Cardiovascular: Negative.   Gastrointestinal: Positive for nausea.  Genitourinary: Negative.   Musculoskeletal: Negative.   Skin: Negative.   Neurological: Negative.   Endo/Heme/Allergies: Negative.   Psychiatric/Behavioral: Negative.      Physical Exam:  weight is 206 lb (93.4 kg). His oral temperature is 98.2 F (36.8 C). His blood pressure is 125/62 and his pulse is 77. His respiration is 20 and oxygen saturation is 98%.   Wt Readings from Last 3 Encounters:  07/06/17 206 lb (93.4 kg)  06/08/17 208 lb 12 oz (94.7 kg)  05/18/17 206 lb (93.4 kg)    Physical Exam  Constitutional: He is oriented to person, place, and time.  HENT:  Head: Normocephalic and atraumatic.  Mouth/Throat: Oropharynx is clear and moist.  Eyes: EOM are normal. Pupils are equal, round, and reactive to light.  Neck: Normal range of motion.  Cardiovascular: Normal rate, regular rhythm and normal heart sounds.  Pulmonary/Chest: Effort normal and breath sounds normal.  Abdominal: Soft. Bowel sounds are normal.  Musculoskeletal: Normal range of motion. He exhibits no edema, tenderness or deformity.  Lymphadenopathy:    He has no cervical adenopathy.  Neurological: He is alert and oriented to person, place, and time.  Skin: Skin is warm and dry. No rash noted. No erythema.  Psychiatric: He has a normal mood and affect. His behavior is normal. Judgment and thought content normal.  Vitals reviewed.   Lab Results  Component Value Date   WBC 2.5 (L) 07/06/2017   HGB 11.8 (L) 05/04/2017   HCT 29.4 (L) 07/06/2017   MCV 92.7 07/06/2017   PLT 317 07/06/2017   Lab Results  Component Value Date   FERRITIN 89 06/08/2017   IRON 76 06/08/2017   TIBC  325 06/08/2017   UIBC 249 06/08/2017   IRONPCTSAT 23 (L) 06/08/2017   Lab Results  Component Value Date   RETICCTPCT 2.8 (H) 06/08/2017   RBC 3.17 (L) 07/06/2017   No results found for: KPAFRELGTCHN, LAMBDASER, [facility] Lab Results  Component Value Date   IGA 232 01/25/2015   No results found for: Ronnald Ramp, A1GS, A2GS, Tillman Sers, SPEI   Chemistry      Component Value Date/Time   NA 137 06/22/2017 0936   NA 134 05/04/2017 0925   K 4.4 06/22/2017 0936  K 4.5 05/04/2017 0925   CL 99 06/22/2017 0936   CL 97 (L) 05/04/2017 0925   CO2 24 06/22/2017 0936   CO2 23 05/04/2017 0925   BUN 22 06/22/2017 0936   BUN 28 (H) 05/04/2017 0925   CREATININE 1.30 (H) 06/22/2017 0936   CREATININE 0.90 06/15/2017 0820   CREATININE 1.2 05/04/2017 0925   GLU 86 03/31/2016      Component Value Date/Time   CALCIUM 8.7 06/22/2017 0936   CALCIUM 9.3 05/04/2017 0925   ALKPHOS 67 06/22/2017 0936   ALKPHOS 58 05/04/2017 0925   AST 69 (H) 06/22/2017 0936   AST 45 (H) 06/15/2017 0820   ALT 98 (H) 06/22/2017 0936   ALT 42 06/15/2017 0820   ALT 90 (H) 05/04/2017 0925   BILITOT 0.7 06/22/2017 0936   BILITOT 0.6 06/15/2017 0820      Impression and Plan: [name] is a very pleasant 62 yo caucasian gentleman with infiltrating high grade papillary urothelial carcinoma with right nephrectomy in October 2018.   We will go ahead with his fifth cycle of chemotherapy today.  He will get a total of 6 cycles.  With  He is not on Marinol.  He says he cannot afford Marinol.  I will try him on some Megace to try to help with his appetite.  We will see if the Zyprexa can also help.  We will plan to get him back in 3 weeks for the start of the sixth and final cycle of treatment.  [name], MD 2/25/20199:01 AM

## 2017-07-06 NOTE — Addendum Note (Signed)
Addended by: Burney Gauze R on: 07/06/2017 11:44 AM   Modules accepted: Orders

## 2017-07-06 NOTE — Progress Notes (Signed)
Labs revie with Dr. [name].  Ok to treat.

## 2017-07-07 DIAGNOSIS — E119 Type 2 diabetes mellitus without complications: Secondary | ICD-10-CM | POA: Diagnosis not present

## 2017-07-08 ENCOUNTER — Other Ambulatory Visit: Payer: Self-pay | Admitting: Family

## 2017-07-10 ENCOUNTER — Other Ambulatory Visit: Payer: Self-pay | Admitting: *Deleted

## 2017-07-10 DIAGNOSIS — C676 Malignant neoplasm of ureteric orifice: Secondary | ICD-10-CM

## 2017-07-13 ENCOUNTER — Inpatient Hospital Stay: Payer: Medicare Other

## 2017-07-13 ENCOUNTER — Inpatient Hospital Stay: Payer: Medicare Other | Attending: Hematology & Oncology

## 2017-07-13 ENCOUNTER — Other Ambulatory Visit: Payer: Self-pay

## 2017-07-13 VITALS — BP 122/73 | HR 77 | Temp 97.9°F | Resp 18

## 2017-07-13 VITALS — BP 127/84 | Wt 202.0 lb

## 2017-07-13 DIAGNOSIS — C676 Malignant neoplasm of ureteric orifice: Secondary | ICD-10-CM

## 2017-07-13 DIAGNOSIS — C651 Malignant neoplasm of right renal pelvis: Secondary | ICD-10-CM | POA: Insufficient documentation

## 2017-07-13 DIAGNOSIS — D508 Other iron deficiency anemias: Secondary | ICD-10-CM

## 2017-07-13 DIAGNOSIS — Z794 Long term (current) use of insulin: Secondary | ICD-10-CM | POA: Diagnosis not present

## 2017-07-13 DIAGNOSIS — Z7689 Persons encountering health services in other specified circumstances: Secondary | ICD-10-CM | POA: Diagnosis not present

## 2017-07-13 DIAGNOSIS — E1165 Type 2 diabetes mellitus with hyperglycemia: Secondary | ICD-10-CM | POA: Diagnosis not present

## 2017-07-13 DIAGNOSIS — Z5111 Encounter for antineoplastic chemotherapy: Secondary | ICD-10-CM | POA: Diagnosis not present

## 2017-07-13 LAB — CMP (CANCER CENTER ONLY)
ALK PHOS: 66 U/L (ref 26–84)
ALT: 48 U/L — AB (ref 10–47)
AST: 37 U/L (ref 11–38)
Albumin: 3.7 g/dL (ref 3.5–5.0)
Anion gap: 9 (ref 5–15)
BUN: 27 mg/dL — ABNORMAL HIGH (ref 7–22)
CO2: 23 mmol/L (ref 18–33)
Calcium: 8.6 mg/dL (ref 8.0–10.3)
Chloride: 104 mmol/L (ref 98–108)
Creatinine: 1.2 mg/dL (ref 0.60–1.20)
GLUCOSE: 315 mg/dL — AB (ref 73–118)
POTASSIUM: 4.4 mmol/L (ref 3.3–4.7)
Sodium: 136 mmol/L (ref 128–145)
TOTAL PROTEIN: 6.9 g/dL (ref 6.4–8.1)
Total Bilirubin: 0.6 mg/dL (ref 0.2–1.6)

## 2017-07-13 LAB — CBC WITH DIFFERENTIAL (CANCER CENTER ONLY)
BASOS PCT: 1 %
Basophils Absolute: 0 10*3/uL (ref 0.0–0.1)
Eosinophils Absolute: 0 10*3/uL (ref 0.0–0.5)
Eosinophils Relative: 1 %
HEMATOCRIT: 28.7 % — AB (ref 38.7–49.9)
HEMOGLOBIN: 9.9 g/dL — AB (ref 13.0–17.1)
Lymphocytes Relative: 39 %
Lymphs Abs: 0.7 10*3/uL — ABNORMAL LOW (ref 0.9–3.3)
MCH: 31.3 pg (ref 28.0–33.4)
MCHC: 34.5 g/dL (ref 32.0–35.9)
MCV: 90.8 fL (ref 82.0–98.0)
Monocytes Absolute: 0.2 10*3/uL (ref 0.1–0.9)
Monocytes Relative: 13 %
NEUTROS ABS: 0.8 10*3/uL — AB (ref 1.5–6.5)
Neutrophils Relative %: 46 %
Platelet Count: 161 10*3/uL (ref 145–400)
RBC: 3.16 MIL/uL — AB (ref 4.20–5.70)
RDW: 18.9 % — ABNORMAL HIGH (ref 11.1–15.7)
WBC: 1.8 10*3/uL — AB (ref 4.0–10.0)

## 2017-07-13 MED ORDER — SODIUM CHLORIDE 0.9 % IV SOLN
Freq: Once | INTRAVENOUS | Status: AC
  Administered 2017-07-13: 10:00:00 via INTRAVENOUS

## 2017-07-13 MED ORDER — HEPARIN SOD (PORK) LOCK FLUSH 100 UNIT/ML IV SOLN
500.0000 [IU] | Freq: Once | INTRAVENOUS | Status: AC | PRN
  Administered 2017-07-13: 500 [IU]
  Filled 2017-07-13: qty 5

## 2017-07-13 MED ORDER — SODIUM CHLORIDE 0.9% FLUSH
10.0000 mL | INTRAVENOUS | Status: DC | PRN
  Administered 2017-07-13: 10 mL
  Filled 2017-07-13: qty 10

## 2017-07-13 MED ORDER — PROCHLORPERAZINE MALEATE 10 MG PO TABS
10.0000 mg | ORAL_TABLET | Freq: Once | ORAL | Status: AC
  Administered 2017-07-13: 10 mg via ORAL

## 2017-07-13 MED ORDER — GEMCITABINE HCL CHEMO INJECTION 1 GM/26.3ML
800.0000 mg/m2 | Freq: Once | INTRAVENOUS | Status: AC
  Administered 2017-07-13: 1710 mg via INTRAVENOUS
  Filled 2017-07-13: qty 21.04

## 2017-07-13 MED ORDER — SODIUM CHLORIDE 0.9 % IV SOLN: Freq: Once | INTRAVENOUS | Status: DC

## 2017-07-13 MED ORDER — PROCHLORPERAZINE MALEATE 10 MG PO TABS
ORAL_TABLET | ORAL | Status: AC
  Filled 2017-07-13: qty 1

## 2017-07-13 MED ORDER — FERUMOXYTOL INJECTION 510 MG/17 ML
510.0000 mg | Freq: Once | INTRAVENOUS | Status: AC
  Administered 2017-07-13: 510 mg via INTRAVENOUS
  Filled 2017-07-13: qty 17

## 2017-07-13 NOTE — Patient Instructions (Signed)
Implanted Port Home Guide An implanted port is a type of central line that is placed under the skin. Central lines are used to provide IV access when treatment or nutrition needs to be given through a person's veins. Implanted ports are used for long-term IV access. An implanted port may be placed because:  You need IV medicine that would be irritating to the small veins in your hands or arms.  You need long-term IV medicines, such as antibiotics.  You need IV nutrition for a long period.  You need frequent blood draws for lab tests.  You need dialysis.  Implanted ports are usually placed in the chest area, but they can also be placed in the upper arm, the abdomen, or the leg. An implanted port has two main parts:  Reservoir. The reservoir is round and will appear as a small, raised area under your skin. The reservoir is the part where a needle is inserted to give medicines or draw blood.  Catheter. The catheter is a thin, flexible tube that extends from the reservoir. The catheter is placed into a large vein. Medicine that is inserted into the reservoir goes into the catheter and then into the vein.  How will I care for my incision site? Do not get the incision site wet. Bathe or shower as directed by your health care provider. How is my port accessed? Special steps must be taken to access the port:  Before the port is accessed, a numbing cream can be placed on the skin. This helps numb the skin over the port site.  Your health care provider uses a sterile technique to access the port. ? Your health care provider must put on a mask and sterile gloves. ? The skin over your port is cleaned carefully with an antiseptic and allowed to dry. ? The port is gently pinched between sterile gloves, and a needle is inserted into the port.  Only "non-coring" port needles should be used to access the port. Once the port is accessed, a blood return should be checked. This helps ensure that the port  is in the vein and is not clogged.  If your port needs to remain accessed for a constant infusion, a clear (transparent) bandage will be placed over the needle site. The bandage and needle will need to be changed every week, or as directed by your health care provider.  Keep the bandage covering the needle clean and dry. Do not get it wet. Follow your health care provider's instructions on how to take a shower or bath while the port is accessed.  If your port does not need to stay accessed, no bandage is needed over the port.  What is flushing? Flushing helps keep the port from getting clogged. Follow your health care provider's instructions on how and when to flush the port. Ports are usually flushed with saline solution or a medicine called heparin. The need for flushing will depend on how the port is used.  If the port is used for intermittent medicines or blood draws, the port will need to be flushed: ? After medicines have been given. ? After blood has been drawn. ? As part of routine maintenance.  If a constant infusion is running, the port may not need to be flushed.  How long will my port stay implanted? The port can stay in for as long as your health care provider thinks it is needed. When it is time for the port to come out, surgery will be   done to remove it. The procedure is similar to the one performed when the port was put in. When should I seek immediate medical care? When you have an implanted port, you should seek immediate medical care if:  You notice a bad smell coming from the incision site.  You have swelling, redness, or drainage at the incision site.  You have more swelling or pain at the port site or the surrounding area.  You have a fever that is not controlled with medicine.  This information is not intended to replace advice given to you by your health care provider. Make sure you discuss any questions you have with your health care provider. Document  Released: 04/28/2005 Document Revised: 10/04/2015 Document Reviewed: 01/03/2013 Elsevier Interactive Patient Education  2017 Elsevier Inc.  

## 2017-07-13 NOTE — Progress Notes (Signed)
Patient complains of lower right leg pain since Saturday. Patient has not noticed any redness or swelling in either leg. Laverna Peace, NP notified.   ANC 0.8 ok to treat with Neulasta approval from insurance per Dr. [name].  Patient verbalized understanding.

## 2017-07-13 NOTE — Patient Instructions (Addendum)
Ferumoxytol injection (Feraheme) What is this medicine? FERUMOXYTOL is an iron complex. Iron is used to make healthy red blood cells, which carry oxygen and nutrients throughout the body. This medicine is used to treat iron deficiency anemia in people with chronic kidney disease. This medicine may be used for other purposes; ask your health care provider or pharmacist if you have questions. COMMON BRAND NAME(S): Feraheme What should I tell my health care provider before I take this medicine? They need to know if you have any of these conditions: -anemia not caused by low iron levels -high levels of iron in the blood -magnetic resonance imaging (MRI) test scheduled -an unusual or allergic reaction to iron, other medicines, foods, dyes, or preservatives -pregnant or trying to get pregnant -breast-feeding How should I use this medicine? This medicine is for injection into a vein. It is given by a health care professional in a hospital or clinic setting. Talk to your pediatrician regarding the use of this medicine in children. Special care may be needed. Overdosage: If you think you have taken too much of this medicine contact a poison control center or emergency room at once. NOTE: This medicine is only for you. Do not share this medicine with others. What if I miss a dose? It is important not to miss your dose. Call your doctor or health care professional if you are unable to keep an appointment. What may interact with this medicine? This medicine may interact with the following medications: -other iron products This list may not describe all possible interactions. Give your health care provider a list of all the medicines, herbs, non-prescription drugs, or dietary supplements you use. Also tell them if you smoke, drink alcohol, or use illegal drugs. Some items may interact with your medicine. What should I watch for while using this medicine? Visit your doctor or healthcare professional  regularly. Tell your doctor or healthcare professional if your symptoms do not start to get better or if they get worse. You may need blood work done while you are taking this medicine. You may need to follow a special diet. Talk to your doctor. Foods that contain iron include: whole grains/cereals, dried fruits, beans, or peas, leafy green vegetables, and organ meats (liver, kidney). What side effects may I notice from receiving this medicine? Side effects that you should report to your doctor or health care professional as soon as possible: -allergic reactions like skin rash, itching or hives, swelling of the face, lips, or tongue -breathing problems -changes in blood pressure -feeling faint or lightheaded, falls -fever or chills -flushing, sweating, or hot feelings -swelling of the ankles or feet Side effects that usually do not require medical attention (report to your doctor or health care professional if they continue or are bothersome): -diarrhea -headache -nausea, vomiting -stomach pain This list may not describe all possible side effects. Call your doctor for medical advice about side effects. You may report side effects to FDA at 1-800-FDA-1088. Where should I keep my medicine? This drug is given in a hospital or clinic and will not be stored at home. NOTE: This sheet is a summary. It may not cover all possible information. If you have questions about this medicine, talk to your doctor, pharmacist, or health care provider.  2018 Elsevier/Gold Standard (2015-05-31 12:41:49)   Managing Low Blood Counts During Cancer Treatment Cancer treatments such as chemotherapy and radiation can sometimes cause a drop in the supply of blood cells in the body, including red blood cells, white  blood cells, and platelets. These blood cells are produced in the body and are released into the blood to perform specific functions:  Red blood cells carry gases such as oxygen and carbon dioxide to and  from your lungs.  White blood cells help protect you from infection.  Platelets help your body to form blood clots to prevent and control bleeding.  When cancer treatments cause a drop in blood cell counts, your body may not have enough cells to keep up its normal functions. Symptoms or problems that may result will vary depending on which type of blood cells the treatment is affecting. If your blood counts are low, you can take steps to help manage any problems. How can low blood counts affect me? Low blood counts have various effects depending on the type of blood cells involved:  If you have a low number of red blood cells, you have a condition called anemia. This can cause symptoms such as: ? Feeling tired and weak. ? Feeling light-headed. ? Being short of breath.  If you have a low number of white blood cells, you may be at higher risk for infections.  If you have a low number of platelets, you may bleed more easily, or your body may have trouble stopping any bleeding. You may also have more bruising.  How to manage symptoms or prevent problems from a low blood count If you have a low blood count, you can take steps to manage symptoms or prevent problems that may develop. The steps to take will depend on which type of blood cell is low. Low red blood cells Take these steps to help manage the symptoms of anemia:  Go for a walk or do some light exercise each day.  Take short naps during the day.  Eat foods that contain a lot of iron and protein. These include leafy vegetables, meat and fish, beans, sweet potatoes, and dried fruit such as prunes, raisins, and apricots.  Ask for help with errands and with work that needs to be done around the house. It is important to save your energy.  Take vitamins or supplements-such as iron, vitamin H41, or folic acid-as told by your health care provider.  Practice relaxation techniques, such as yoga or meditation.  Low white blood  cells Take these steps to help prevent infections:  Wash your hands often with warm, soapy water.  Avoid crowds of people and any person who has the flu or a fever.  Take care when cleaning yourself after using the bathroom. Tell your health care provider if you have any rectal sores or bleeding.  Avoid dental work. Check your mouth each day for sores or signs of infection.  Do not share utensils.  Avoid contact with pet waste. Wash your hands after handling pets.  If you get a scrape or cut, clean it thoroughly right away.  Avoid fresh plants or dried flowers.  Do not swim or wade in lakes, ponds, rivers, water parks, or hot tubs.  Follow food safety guidelines. Cook meat thoroughly and wash all raw fruits and vegetables.  You may be instructed to wear a mask when around others to protect yourself.  Low platelets Take these steps to help prevent or control bleeding and bruising:  Use an electric razor for shaving instead of a blade.  Use a soft toothbrush and be careful during oral care. Talk with your cancer care team about whether you should avoid flossing. If your mouth is bleeding, rinse it with  ice water.  Avoid activities that could cause injury, such as contact sports.  Talk with your health care provider about using laxatives or stool softeners to avoid constipation.  Do not use medicines such as ibuprofen, aspirin, or naproxen unless your health care provider tells you to.  Limit alcohol use.  Monitor any bleeding closely. If you start bleeding, hold pressure on the area for 5 minutes to stop the bleeding. Bleeding that does not stop is considered an emergency.  What treatments can help increase a low blood count? If needed, your health care provider may recommend treatment for a low blood count. Treatment will depend on the type of blood cell that is low and the severity of your condition. Treatment options may include:  Taking medicines to help stimulate the  growth of blood cells. This is an option for treating a low red blood cell count. Your health care provider may also recommend that you take iron, folic acid, or vitamin B12 supplements.  Making dietary changes. Including more iron and protein in your diet can help stimulate the growth of red blood cells.  Adjusting your current medicines to help raise blood counts.  Making changes to your treatment plan.  Having a blood transfusion. This may be done if your blood count is very low.  Contact a health care provider if:  You feel extremely tired and weak.  You have more bruising or bleeding.  You feel ill or you develop a cough.  You have swelling or redness.  You have mouth sores or a sore throat.  You have painful urination or you have blood in your urine or stool.  You are thinking of taking any new supplements or vitamins or making dietary changes. Get help right away if:  You are short of breath, have chest pain, or feel dizzy.  You have a fever or chills.  You have abdominal pain or diarrhea.  You have bleeding that will not stop. Summary  Cancer treatments such as chemotherapy and radiation can sometimes cause a drop in the supply of blood cells in the body, including red blood cells, white blood cells, and platelets.  If you have a low blood count, you can take steps to manage symptoms or prevent problems that may develop.  Depending on which type of blood cell is low, you may need to take steps to prevent infection, prevent bleeding, or manage symptoms that may develop.  If needed, your health care provider may recommend treatment for a low blood count. This information is not intended to replace advice given to you by your health care provider. Make sure you discuss any questions you have with your health care provider. Document Released: 12/22/2015 Document Revised: 12/22/2015 Document Reviewed: 12/22/2015 Elsevier Interactive Patient Education  [employer].

## 2017-07-14 ENCOUNTER — Other Ambulatory Visit: Payer: Self-pay

## 2017-07-14 ENCOUNTER — Other Ambulatory Visit: Payer: Self-pay | Admitting: Family

## 2017-07-14 ENCOUNTER — Inpatient Hospital Stay: Payer: Medicare Other

## 2017-07-14 VITALS — BP 120/80 | HR 98 | Temp 99.5°F | Resp 18

## 2017-07-14 DIAGNOSIS — C651 Malignant neoplasm of right renal pelvis: Secondary | ICD-10-CM

## 2017-07-14 MED ORDER — PEGFILGRASTIM-CBQV 6 MG/0.6ML ~~LOC~~ SOSY
6.0000 mg | PREFILLED_SYRINGE | Freq: Once | SUBCUTANEOUS | Status: AC
  Administered 2017-07-14: 6 mg via SUBCUTANEOUS
  Filled 2017-07-14: qty 0.6

## 2017-07-14 NOTE — Patient Instructions (Signed)
Pegfilgrastim injection What is this medicine? PEGFILGRASTIM (PEG fil gra stim) is a long-acting granulocyte colony-stimulating factor that stimulates the growth of neutrophils, a type of white blood cell important in the body's fight against infection. It is used to reduce the incidence of fever and infection in patients with certain types of cancer who are receiving chemotherapy that affects the bone marrow, and to increase survival after being exposed to high doses of radiation. This medicine may be used for other purposes; ask your health care provider or pharmacist if you have questions. COMMON BRAND NAME(S): Neulasta What should I tell my health care provider before I take this medicine? They need to know if you have any of these conditions: -kidney disease -latex allergy -ongoing radiation therapy -sickle cell disease -skin reactions to acrylic adhesives (On-Body Injector only) -an unusual or allergic reaction to pegfilgrastim, filgrastim, other medicines, foods, dyes, or preservatives -pregnant or trying to get pregnant -breast-feeding How should I use this medicine? This medicine is for injection under the skin. If you get this medicine at home, you will be taught how to prepare and give the pre-filled syringe or how to use the On-body Injector. Refer to the patient Instructions for Use for detailed instructions. Use exactly as directed. Tell your healthcare provider immediately if you suspect that the On-body Injector may not have performed as intended or if you suspect the use of the On-body Injector resulted in a missed or partial dose. It is important that you put your used needles and syringes in a special sharps container. Do not put them in a trash can. If you do not have a sharps container, call your pharmacist or healthcare provider to get one. Talk to your pediatrician regarding the use of this medicine in children. While this drug may be prescribed for selected conditions,  precautions do apply. Overdosage: If you think you have taken too much of this medicine contact a poison control center or emergency room at once. NOTE: This medicine is only for you. Do not share this medicine with others. What if I miss a dose? It is important not to miss your dose. Call your doctor or health care professional if you miss your dose. If you miss a dose due to an On-body Injector failure or leakage, a new dose should be administered as soon as possible using a single prefilled syringe for manual use. What may interact with this medicine? Interactions have not been studied. Give your health care provider a list of all the medicines, herbs, non-prescription drugs, or dietary supplements you use. Also tell them if you smoke, drink alcohol, or use illegal drugs. Some items may interact with your medicine. This list may not describe all possible interactions. Give your health care provider a list of all the medicines, herbs, non-prescription drugs, or dietary supplements you use. Also tell them if you smoke, drink alcohol, or use illegal drugs. Some items may interact with your medicine. What should I watch for while using this medicine? You may need blood work done while you are taking this medicine. If you are going to need a MRI, CT scan, or other procedure, tell your doctor that you are using this medicine (On-Body Injector only). What side effects may I notice from receiving this medicine? Side effects that you should report to your doctor or health care professional as soon as possible: -allergic reactions like skin rash, itching or hives, swelling of the face, lips, or tongue -dizziness -fever -pain, redness, or irritation at site   where injected -pinpoint red spots on the skin -red or dark-brown urine -shortness of breath or breathing problems -stomach or side pain, or pain at the shoulder -swelling -tiredness -trouble passing urine or change in the amount of urine Side  effects that usually do not require medical attention (report to your doctor or health care professional if they continue or are bothersome): -bone pain -muscle pain This list may not describe all possible side effects. Call your doctor for medical advice about side effects. You may report side effects to FDA at 1-800-FDA-1088. Where should I keep my medicine? Keep out of the reach of children. Store pre-filled syringes in a refrigerator between 2 and 8 degrees C (36 and 46 degrees F). Do not freeze. Keep in carton to protect from light. Throw away this medicine if it is left out of the refrigerator for more than 48 hours. Throw away any unused medicine after the expiration date. NOTE: This sheet is a summary. It may not cover all possible information. If you have questions about this medicine, talk to your doctor, pharmacist, or health care provider.  2018 Elsevier/Gold Standard (2016-04-24 12:58:03)  

## 2017-07-21 ENCOUNTER — Other Ambulatory Visit: Payer: Self-pay | Admitting: Internal Medicine

## 2017-07-22 MED ORDER — AMLODIPINE BESYLATE 10 MG PO TABS: ORAL_TABLET | ORAL | 1 refills | Status: DC

## 2017-07-22 NOTE — Addendum Note (Signed)
Addended by: Derl Barrow on: 07/22/2017 04:27 PM   Modules accepted: Orders

## 2017-07-27 ENCOUNTER — Inpatient Hospital Stay (HOSPITAL_BASED_OUTPATIENT_CLINIC_OR_DEPARTMENT_OTHER): Payer: Medicare Other | Admitting: Hematology & Oncology

## 2017-07-27 ENCOUNTER — Inpatient Hospital Stay: Payer: Medicare Other

## 2017-07-27 ENCOUNTER — Encounter: Payer: Self-pay | Admitting: Hematology & Oncology

## 2017-07-27 ENCOUNTER — Other Ambulatory Visit: Payer: Self-pay

## 2017-07-27 VITALS — BP 126/79 | HR 88 | Temp 98.0°F | Resp 18 | Wt 198.0 lb

## 2017-07-27 DIAGNOSIS — Z794 Long term (current) use of insulin: Secondary | ICD-10-CM | POA: Diagnosis not present

## 2017-07-27 DIAGNOSIS — Z5111 Encounter for antineoplastic chemotherapy: Secondary | ICD-10-CM | POA: Diagnosis not present

## 2017-07-27 DIAGNOSIS — C651 Malignant neoplasm of right renal pelvis: Secondary | ICD-10-CM

## 2017-07-27 DIAGNOSIS — E1165 Type 2 diabetes mellitus with hyperglycemia: Secondary | ICD-10-CM | POA: Diagnosis not present

## 2017-07-27 DIAGNOSIS — D631 Anemia in chronic kidney disease: Secondary | ICD-10-CM

## 2017-07-27 DIAGNOSIS — N186 End stage renal disease: Secondary | ICD-10-CM

## 2017-07-27 LAB — CBC WITH DIFFERENTIAL (CANCER CENTER ONLY)
BAND NEUTROPHILS: 15 %
BASOS ABS: 0 10*3/uL (ref 0.0–0.1)
BLASTS: 0 %
Basophils Relative: 0 %
EOS ABS: 0 10*3/uL (ref 0.0–0.5)
Eosinophils Relative: 0 %
HEMATOCRIT: 27.2 % — AB (ref 38.7–49.9)
HEMOGLOBIN: 9.1 g/dL — AB (ref 13.0–17.1)
LYMPHS PCT: 13 %
Lymphs Abs: 2.8 10*3/uL (ref 0.9–3.3)
MCH: 32 pg (ref 28.0–33.4)
MCHC: 33.5 g/dL (ref 32.0–35.9)
MCV: 95.8 fL (ref 82.0–98.0)
MONOS PCT: 10 %
Metamyelocytes Relative: 1 %
Monocytes Absolute: 2.1 10*3/uL — ABNORMAL HIGH (ref 0.1–0.9)
Myelocytes: 1 %
NEUTROS ABS: 16.5 10*3/uL — AB (ref 1.5–6.5)
Neutrophils Relative %: 60 %
OTHER: 0 %
PROMYELOCYTES ABS: 0 %
Platelet Count: 102 10*3/uL — ABNORMAL LOW (ref 145–400)
RBC: 2.84 MIL/uL — ABNORMAL LOW (ref 4.20–5.70)
RDW: 19.7 % — ABNORMAL HIGH (ref 11.1–15.7)
WBC: 21.4 10*3/uL — AB (ref 4.0–10.0)
nRBC: 0 /100 WBC

## 2017-07-27 LAB — CMP (CANCER CENTER ONLY)
ALT: 34 U/L (ref 10–47)
ANION GAP: 10 (ref 5–15)
AST: 41 U/L — AB (ref 11–38)
Albumin: 3.7 g/dL (ref 3.5–5.0)
Alkaline Phosphatase: 128 U/L — ABNORMAL HIGH (ref 26–84)
BUN: 20 mg/dL (ref 7–22)
CALCIUM: 8.3 mg/dL (ref 8.0–10.3)
CO2: 23 mmol/L (ref 18–33)
Chloride: 104 mmol/L (ref 98–108)
Creatinine: 1.6 mg/dL — ABNORMAL HIGH (ref 0.60–1.20)
GLUCOSE: 171 mg/dL — AB (ref 73–118)
POTASSIUM: 4.4 mmol/L (ref 3.3–4.7)
Sodium: 137 mmol/L (ref 128–145)
TOTAL PROTEIN: 6.6 g/dL (ref 6.4–8.1)
Total Bilirubin: 0.6 mg/dL (ref 0.2–1.6)

## 2017-07-27 LAB — IRON AND TIBC
Iron: 213 ug/dL — ABNORMAL HIGH (ref 42–163)
Saturation Ratios: 83 % (ref 42–163)
TIBC: 258 ug/dL (ref 202–409)
UIBC: 45 ug/dL

## 2017-07-27 LAB — FERRITIN: Ferritin: 2122 ng/mL — ABNORMAL HIGH (ref 22–316)

## 2017-07-27 LAB — LACTATE DEHYDROGENASE: LDH: 428 U/L — AB (ref 125–245)

## 2017-07-27 MED ORDER — SODIUM CHLORIDE 0.9 % IV SOLN
800.0000 mg/m2 | Freq: Once | INTRAVENOUS | Status: AC
  Administered 2017-07-27: 1710 mg via INTRAVENOUS
  Filled 2017-07-27: qty 25.74

## 2017-07-27 MED ORDER — CISPLATIN CHEMO INJECTION 100MG/100ML
70.5000 mg/m2 | Freq: Once | INTRAVENOUS | Status: AC
  Administered 2017-07-27: 150 mg via INTRAVENOUS
  Filled 2017-07-27: qty 100

## 2017-07-27 MED ORDER — SODIUM CHLORIDE 0.9% FLUSH
10.0000 mL | INTRAVENOUS | Status: DC | PRN
  Administered 2017-07-27: 10 mL
  Filled 2017-07-27: qty 10

## 2017-07-27 MED ORDER — SODIUM CHLORIDE 0.9 % IV SOLN
Freq: Once | INTRAVENOUS | Status: AC
  Administered 2017-07-27: 09:00:00 via INTRAVENOUS

## 2017-07-27 MED ORDER — HEPARIN SOD (PORK) LOCK FLUSH 100 UNIT/ML IV SOLN
500.0000 [IU] | Freq: Once | INTRAVENOUS | Status: AC | PRN
  Administered 2017-07-27: 500 [IU]
  Filled 2017-07-27: qty 5

## 2017-07-27 MED ORDER — PALONOSETRON HCL INJECTION 0.25 MG/5ML
INTRAVENOUS | Status: AC
  Filled 2017-07-27: qty 5

## 2017-07-27 MED ORDER — FOSAPREPITANT DIMEGLUMINE INJECTION 150 MG
Freq: Once | INTRAVENOUS | Status: AC
  Administered 2017-07-27: 12:00:00 via INTRAVENOUS
  Filled 2017-07-27: qty 5

## 2017-07-27 MED ORDER — POTASSIUM CHLORIDE 2 MEQ/ML IV SOLN
Freq: Once | INTRAVENOUS | Status: AC
  Administered 2017-07-27: 09:00:00 via INTRAVENOUS
  Filled 2017-07-27: qty 10

## 2017-07-27 MED ORDER — PALONOSETRON HCL INJECTION 0.25 MG/5ML
0.2500 mg | Freq: Once | INTRAVENOUS | Status: AC
  Administered 2017-07-27: 0.25 mg via INTRAVENOUS

## 2017-07-27 NOTE — Progress Notes (Signed)
Hematology and Oncology Follow Up Visit  Isaac [name] [id number] [birth year] 62 y.o. 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 is here today for follow-up.  He looks quite good.  He feels pretty good.  He really has had no specific complaints.  His blood sugars have been on the high side.  He is trying to get these under control.  He has had no cough or shortness of breath.  He has had no change in bowel or bladder habits.  There is been no bleeding.  He is had no leg rashes.  He has had no problems with weight loss or weight gain.  There is been no hearing difficulties.    Overall, his performance status is ECOG 1.  Medications:  Allergies as of 07/27/2017      Reactions   Dilaudid [hydromorphone] Nausea And Vomiting   Hydrocodone Itching, Nausea And Vomiting      Medication List        Accurate as of 07/27/17 11:48 AM. Always use your most recent med list.          ALPRAZolam 0.5 MG tablet Commonly known as:  XANAX Take 1 tablet (0.5 mg total) by mouth 3 (three) times daily as needed for anxiety.   amLODipine 10 MG tablet Commonly known as:  NORVASC TAKE 1 TABLET BY MOUTH EVERY DAY. HOLD FOR SYSTOLIC BLOOD [id number]   baclofen 10 MG tablet Commonly known as:  LIORESAL Take 0.5 tablets (5 mg total) by mouth every 12 (twelve) hours as needed for muscle spasms.   dexamethasone 4 MG tablet Commonly known as:  DECADRON Take 2 tablets by mouth once a day on the day after cisplatin chemotherapy and then take 2 tablets two times a day for 2 days. Take with food.   docusate sodium 100 MG capsule Commonly known as:  COLACE Take 1 capsule (100 mg total) by mouth 2 (two) times daily as needed (take to keep stool soft.).   dronabinol 5 MG  capsule Commonly known as:  MARINOL Take 1 capsule (5 mg total) by mouth 2 (two) times daily before a meal.   glimepiride 2 MG tablet Commonly known as:  AMARYL TAKE 1 BY MOUTH DAILY BEFORE SUPPER   INSULIN GLARGINE [place] Inject 30 Units into the skin.   isosorbide mononitrate 120 MG 24 hr tablet Commonly known as:  IMDUR Take 1 tablet (120 mg total) by mouth daily.   lidocaine-prilocaine cream Commonly known as:  EMLA Apply to affected area once   LORazepam 0.5 MG tablet Commonly known as:  ATIVAN Take 1 tablet (0.5 mg total) every 6 (six) hours as needed by mouth (Nausea or vomiting).   megestrol 400 MG/10ML suspension Commonly known as:  MEGACE Take 10 mLs (400 mg total) by mouth 2 (two) times daily.   metFORMIN 1000 MG tablet Commonly known as:  GLUCOPHAGE Take 1 tablet (1,000 mg total) by mouth 2 (two) times daily with a meal.   metoprolol succinate 25 MG 24 hr tablet Commonly known as:  TOPROL-XL Take 1 tablet (25 mg total) at bedtime by mouth.   nitroGLYCERIN 0.4 MG SL tablet Commonly known as:  NITROSTAT Place 1 tablet (0.4 mg total) under the tongue every 5 (five) minutes as needed for chest pain.   OLANZapine 10 MG tablet Commonly known as:  ZYPREXA Take 1 tablet (10 mg total) by mouth at bedtime.   ondansetron 8 MG tablet Commonly known as:  ZOFRAN Take 1 tablet (8 mg total) 2 (two) times daily as needed by mouth. Start on the third day after cisplatin chemotherapy.   pantoprazole 40 MG tablet Commonly known as:  PROTONIX Take 1 tablet (40 mg total) by mouth every morning.   Pen Needles 32G X 4 MM Misc by Does not apply route.   BD PEN NEEDLE NANO U/F 32G X 4 MM Misc Generic drug:  Insulin Pen Needle U ONCE D   prochlorperazine 10 MG tablet Commonly known as:  COMPAZINE Take 1 tablet (10 mg total) every 6 (six) hours as needed by mouth (Nausea or vomiting).   rOPINIRole 2 MG tablet Commonly known as:  REQUIP Take 1 tablet (2 mg total) by mouth  at bedtime.   rosuvastatin 10 MG tablet Commonly known as:  CRESTOR TAKE 1 TAB (10 MG) BY MOUTH EVERY OTHER DAY       Allergies:  Allergies  Allergen Reactions  . Dilaudid [Hydromorphone] Nausea And Vomiting  . Hydrocodone Itching and Nausea And Vomiting    Past Medical History, Surgical history, Social history, and Family History were reviewed and updated.  Review of Systems: Review of Systems  Constitutional: Negative.   HENT: Positive for tinnitus.   Eyes: Negative.   Respiratory: Negative.   Cardiovascular: Negative.   Gastrointestinal: Positive for nausea.  Genitourinary: Negative.   Musculoskeletal: Negative.   Skin: Negative.   Neurological: Negative.   Endo/Heme/Allergies: Negative.   Psychiatric/Behavioral: Negative.      Physical Exam:  weight is 198 lb (89.8 kg). His oral temperature is 98 F (36.7 C). His blood pressure is 126/79 and his pulse is 88. His respiration is 18 and oxygen saturation is 99%.   Wt Readings from Last 3 Encounters:  07/27/17 198 lb (89.8 kg)  07/13/17 202 lb (91.6 kg)  07/06/17 206 lb (93.4 kg)    Physical Exam  Constitutional: He is oriented to person, place, and time.  HENT:  Head: Normocephalic and atraumatic.  Mouth/Throat: Oropharynx is clear and moist.  Eyes: EOM are normal. Pupils are equal, round, and reactive to light.  Neck: Normal range of motion.  Cardiovascular: Normal rate, regular rhythm and normal heart sounds.  Pulmonary/Chest: Effort normal and breath sounds normal.  Abdominal: Soft. Bowel sounds are normal.  Musculoskeletal: Normal range of motion. He exhibits no edema, tenderness or deformity.  Lymphadenopathy:    He has no cervical adenopathy.  Neurological: He is alert and oriented to person, place, and time.  Skin: Skin is warm and dry. No rash noted. No erythema.  Psychiatric: He has a normal mood and affect. His behavior is normal. Judgment and thought content normal.  Vitals reviewed.   Lab  Results  Component Value Date   WBC 21.4 (H) 07/27/2017   HGB 11.8 (L) 05/04/2017   HCT 27.2 (L) 07/27/2017   MCV 95.8 07/27/2017   PLT 102 (L) 07/27/2017   Lab Results  Component Value Date   FERRITIN 198 07/06/2017   IRON 71 07/06/2017   TIBC 301 07/06/2017   UIBC 231 07/06/2017   IRONPCTSAT 23 (L) 07/06/2017   Lab Results  Component Value Date   RETICCTPCT 4.9 (H) 07/06/2017   RBC 2.84 (L) 07/27/2017   No results found for: KPAFRELGTCHN, LAMBDASER, [facility] Lab Results  Component Value Date   IGA 232 01/25/2015   No results found for: TOTALPROTELP, ALBUMINELP,  Nils Flack, SPEI   Chemistry      Component Value Date/Time   NA 137 07/27/2017 0803   NA 134 05/04/2017 0925   K 4.4 07/27/2017 0803   K 4.5 05/04/2017 0925   CL 104 07/27/2017 0803   CL 97 (L) 05/04/2017 0925   CO2 23 07/27/2017 0803   CO2 23 05/04/2017 0925   BUN 20 07/27/2017 0803   BUN 28 (H) 05/04/2017 0925   CREATININE 1.60 (H) 07/27/2017 0803   CREATININE 1.2 05/04/2017 0925   GLU 86 03/31/2016      Component Value Date/Time   CALCIUM 8.3 07/27/2017 0803   CALCIUM 9.3 05/04/2017 0925   ALKPHOS 128 (H) 07/27/2017 0803   ALKPHOS 58 05/04/2017 0925   AST 41 (H) 07/27/2017 0803   ALT 34 07/27/2017 0803   ALT 90 (H) 05/04/2017 0925   BILITOT 0.6 07/27/2017 0803      Impression and Plan: [name] is a very pleasant 62 yo caucasian gentleman with infiltrating high grade papillary urothelial carcinoma with right nephrectomy in October 2018.   This will be his sixth and final cycle of chemotherapy.  After this cycle of treatment, we will repeat a PET scan and see how everything looks.  Hopefully, we will find that he is in remission.  I probably will get the PET scan set up for 1 month.  I will see him back in 6 weeks.    [name], MD 3/18/201911:48 AM

## 2017-07-27 NOTE — Patient Instructions (Signed)
[facility] Discharge Instructions for Patients Receiving Chemotherapy  Today you received the following chemotherapy agents:  Cisplatin and Gemzar  To help prevent nausea and vomiting after your treatment, we encourage you to take your nausea medication as ordered per MD.    If you develop nausea and vomiting that is not controlled by your nausea medication, call the clinic.   BELOW ARE SYMPTOMS THAT SHOULD BE REPORTED IMMEDIATELY:  *FEVER GREATER THAN 100.5 F  *CHILLS WITH OR WITHOUT FEVER  NAUSEA AND VOMITING THAT IS NOT CONTROLLED WITH YOUR NAUSEA MEDICATION  *UNUSUAL SHORTNESS OF BREATH  *UNUSUAL BRUISING OR BLEEDING  TENDERNESS IN MOUTH AND THROAT WITH OR WITHOUT PRESENCE OF ULCERS  *URINARY PROBLEMS  *BOWEL PROBLEMS  UNUSUAL RASH Items with * indicate a potential emergency and should be followed up as soon as possible.  Feel free to call the clinic should you have any questions or concerns. The clinic phone number is [phone].  Please show the CHEMO ALERT CARD at check-in to the Emergency Department and triage nurse.   

## 2017-07-28 LAB — ERYTHROPOIETIN: Erythropoietin: 59.7 m[IU]/mL — ABNORMAL HIGH (ref 2.6–18.5)

## 2017-07-30 ENCOUNTER — Ambulatory Visit: Payer: Medicare Other | Admitting: Internal Medicine

## 2017-07-30 ENCOUNTER — Encounter: Payer: Self-pay | Admitting: Internal Medicine

## 2017-07-30 VITALS — BP 110/72 | HR 95 | Ht 69.0 in | Wt 195.1 lb

## 2017-07-30 DIAGNOSIS — I10 Essential (primary) hypertension: Secondary | ICD-10-CM | POA: Diagnosis not present

## 2017-07-30 DIAGNOSIS — E782 Mixed hyperlipidemia: Secondary | ICD-10-CM

## 2017-07-30 DIAGNOSIS — R0602 Shortness of breath: Secondary | ICD-10-CM | POA: Diagnosis not present

## 2017-07-30 DIAGNOSIS — I2510 Atherosclerotic heart disease of native coronary artery without angina pectoris: Secondary | ICD-10-CM | POA: Diagnosis not present

## 2017-07-30 DIAGNOSIS — R42 Dizziness and giddiness: Secondary | ICD-10-CM

## 2017-07-30 MED ORDER — AMLODIPINE BESYLATE 5 MG PO TABS: 5.0000 mg | ORAL_TABLET | Freq: Every day | ORAL | 3 refills | Status: DC

## 2017-07-30 NOTE — Progress Notes (Signed)
Cardiology Office Note   Date:  07/30/2017   ID:  Isaac [name], DOB 08/07/1955, MRN [id number]  PCP:  Elby Showers, MD  Cardiologist:   [name], MD   Pt presents for f/u of chest pain, microvasc dz     History of Present Illness: Isaac [name] is a 62 y.o. 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   Last cath in 2009  Minimal CAD  Myovue in 2014 was low risk  COntinued on ASAn amlodipine, NTG b blocker and statin   I saw the pt in Fall 2018  prior to his kidney surgery   He had r kidney removed  Now undergoing chemotherapy for renal CA  Pt just had infusion on Monday  Wiped out   Today in shower got light headed  Got out of shower  Had to lay down   Had some CP  Tood NTG and went away    Still very weak  SOB with activity    Not eating much  Appetite down  Feels nauseated after    Has chemo again next Monday   Plan for PET scan in April   Current Outpatient Medications  Medication Sig Dispense Refill  . ALPRAZolam (XANAX) 0.5 MG tablet Take 1 tablet (0.5 mg total) by mouth 3 (three) times daily as needed for anxiety. 40 tablet 1  . amLODipine (NORVASC) 10 MG tablet TAKE 1 TABLET BY MOUTH EVERY DAY. HOLD FOR SYSTOLIC BLOOD [id number] 90 tablet 1  . BD PEN NEEDLE NANO U/F 32G X 4 MM MISC U ONCE D  1  . dronabinol (MARINOL) 5 MG capsule Take 1 capsule (5 mg total) by mouth 2 (two) times daily before a meal. 60 capsule 0  . glimepiride (AMARYL) 2 MG tablet TAKE 1 BY MOUTH DAILY BEFORE SUPPER 90 tablet 1  . INSULIN GLARGINE  Inject 30 Units into the skin.     . Insulin Pen Needle (PEN NEEDLES) 32G X 4 MM MISC by Does not apply route.    . isosorbide mononitrate (IMDUR) 120 MG 24 hr tablet Take 1 tablet (120 mg total) by mouth daily. 90 tablet 2  . lidocaine-prilocaine (EMLA) cream Apply to affected area once 30 g 3  . LORazepam (ATIVAN) 0.5 MG tablet Take 1  tablet (0.5 mg total) every 6 (six) hours as needed by mouth (Nausea or vomiting). 30 tablet 0  . megestrol (MEGACE) 400 MG/10ML suspension Take 10 mLs (400 mg total) by mouth 2 (two) times daily. 480 mL 3  . metFORMIN (GLUCOPHAGE) 1000 MG tablet Take 1 tablet (1,000 mg total) by mouth 2 (two) times daily with a meal. 180 tablet 1  . metoprolol succinate (TOPROL-XL) 25 MG 24 hr tablet Take 1 tablet (25 mg total) at bedtime by mouth. 90 tablet 3  . nitroGLYCERIN (NITROSTAT) 0.4 MG SL tablet Place 1 tablet (0.4 mg total) under the tongue every 5 (five) minutes as needed for chest pain. 25 tablet 3  . ondansetron (ZOFRAN) 8 MG tablet Take 1 tablet (8 mg total) 2 (two) times daily as needed by mouth. Start on the third day after cisplatin chemotherapy. 30 tablet 1  . pantoprazole (PROTONIX) 40 MG tablet Take 1 tablet (40 mg total) by mouth every morning. 90 tablet 2  . prochlorperazine (COMPAZINE) 10 MG tablet Take 1 tablet (10 mg  total) every 6 (six) hours as needed by mouth (Nausea or vomiting). 30 tablet 1  . rOPINIRole (REQUIP) 2 MG tablet Take 1 tablet (2 mg total) by mouth at bedtime. 90 tablet 3  . rosuvastatin (CRESTOR) 10 MG tablet TAKE 1 TAB (10 MG) BY MOUTH EVERY OTHER DAY (Patient taking differently: Take 10 mg by mouth every other day. In the morning.) 90 tablet 3  . baclofen (LIORESAL) 10 MG tablet Take 0.5 tablets (5 mg total) by mouth every 12 (twelve) hours as needed for muscle spasms. (Patient not taking: Reported on 07/30/2017) 30 each 1  . dexamethasone (DECADRON) 4 MG tablet Take 2 tablets by mouth once a day on the day after cisplatin chemotherapy and then take 2 tablets two times a day for 2 days. Take with food. (Patient not taking: Reported on 07/30/2017) 30 tablet 1  . docusate sodium (COLACE) 100 MG capsule Take 1 capsule (100 mg total) by mouth 2 (two) times daily as needed (take to keep stool soft.). (Patient not taking: Reported on 07/30/2017) 60 capsule 0  . OLANZapine (ZYPREXA)  10 MG tablet Take 1 tablet (10 mg total) by mouth at bedtime. (Patient not taking: Reported on 07/30/2017) 30 tablet 4   No current facility-administered medications for this visit.    Facility-Administered Medications Ordered in Other Visits  Medication Dose Route Frequency Provider Last Rate Last Dose  . LORazepam (ATIVAN) injection 0.5 mg  0.5 mg Intravenous Once [place], Sarah M, NP      . sodium chloride flush (NS) 0.9 % injection 10 mL  10 mL Intravenous PRN [name], MD   10 mL at 06/08/17 1150    Allergies:   Dilaudid [hydromorphone] and Hydrocodone   Past Medical History:  Diagnosis Date  . Arthritis   . Bladder cancer [facility]) followed by dr Risa Grill  . 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 Coronary artery disease    followed by Dr. [name]; Lexiscan Myoview (10/14):  Low risk, EF 57%, small inf infarct  . Coronary vasospasm ([place]) PER DR Omar Gayden NOTE 03-22-2012--  INTER. TIGHTNESS   PER PT PROBABLE FROM STRESS  . Depression    no rx  . Diabetes mellitus    followed by Dr. Dwyane Dee  . Dyslipidemia    pt unable to tolerate niaspan  . GERD (gastroesophageal reflux disease)   . Goals of care, counseling/discussion 03/18/2017  . History of angina CHRONIC -- CONTROLLED W/ IMDUR  . 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 History of non-ST elevation myocardial infarction (NSTEMI) 2005  . Hypertension   . Myocardial infarction ([place])   . OSA on CPAP   . Peripheral neuropathy RIGHT LEG NUMBNESS  . Pneumonia     Past Surgical History:  Procedure Laterality Date  . APPENDECTOMY  08-03-2004  . CARDIAC CATHETERIZATION  x3  last one 06-17-2007   MILD NON-OBSTRUCTIVE CAD/ NORMAL LVF/ 30% LEFT RENAL ARTERY STENOSIS  . CYSTO/ BILATERAL RETROGRADE PYELOGRAM/ RIGHT URETEROSCOPIC LASER FULGURATION URETERAL TUMOR  02-16-2008  . CYSTO/ BLADDER BX  09-13-2008;  08-13-2007; 04-19-2007; 06-01-2006  . CYSTO/ CYSTOGRAM/ URETEROSCOPY   02-21-2009  . CYSTO/ RESECTION BLADDER TUMOR/ LEFT RETROGRADE PYELOGRAM/ RIGHT URETEROSCOPY  08-07-2010   TCC OF BLADDER   S/P DISTAL URETERECTOMY/ REIMPLANTATION  . CYSTO/ RIGHT URETEROSCOPY/ BX URETERAL TUMOR  10-11-2008  . CYSTOSCOPY W/ RETROGRADES  04/19/2012   Procedure: CYSTOSCOPY WITH RETROGRADE PYELOGRAM;  Surgeon: Bernestine Amass, MD;  Location:  [place];  Service: Urology;  Laterality: Bilateral;  30 MINS Cysto, Biopsy, possible TURBT, Bilateral retrograde pyelograms with Mitomycin C instilation Post op   . CYSTOSCOPY WITH BIOPSY  04/07/2011   Procedure: CYSTOSCOPY WITH BIOPSY/ RIGHT URETEROSCOPY;  Surgeon: Bernestine Amass, MD;  Location[facility];  Service: Urology;  Laterality: N/A;    cystoscopy, cystogram, biopsy and fulgeration  . CYSTOSCOPY WITH BIOPSY  12/29/2011   Procedure: CYSTOSCOPY WITH BIOPSY;  Surgeon: Bernestine Amass, MD;  Location: [facility];  Service: Urology;  Laterality: N/A;  [place]    . ELBOW SURGERY  07-07-2003   RIGHT ELBOW ARTHROSCOPY W/ OPEN RECONSTRUCTION  . IR FLUORO GUIDE PORT INSERTION RIGHT  04/01/2017  . IR US GUIDE VASC ACCESS RIGHT  04/01/2017  . KNEE ARTHROSCOPY W/ DEBRIDEMENT  02-08-2004   RIGHT KNEE  . RHINOPLASTY W/ MODIFIED CARTILAGE GRAFT  05-17-2007   INTERNAL NASAL VALVE COLLAPSE/ OSA/ SEPTAL PERFERATION  . RIGHT DISTAL URETERECTOMY W/ REIMPLANTATION  10-30-2008   TCC OF RIGHT DISTAL URETER  . ROBOT ASSITED LAPAROSCOPIC NEPHROURETERECTOMY Right 02/12/2017   Procedure: XI ROBOT ASSITED LAPAROSCOPIC NEPHROURETERECTOMY;  Surgeon: [name], MD;  Location: [facility];  Service: Urology;  Laterality: Right;  . SHOULDER SURGERY  2005   RIGHT ROTATOR CUFF REPAIR  . TOTAL KNEE ARTHROPLASTY Left 05/18/2015  . TOTAL KNEE ARTHROPLASTY Left 05/18/2015   Procedure: TOTAL KNEE ARTHROPLASTY;  Surgeon: Earlie Server, MD;  Location: [place];  Service: Orthopedics;  Laterality: Left;    . TOTAL KNEE ARTHROPLASTY Right 03/21/2016   Procedure: TOTAL KNEE ARTHROPLASTY;  Surgeon: Earlie Server, MD;  Location: [place];  Service: Orthopedics;  Laterality: Right;  . TRANSURETHRAL RESECTION OF BLADDER TUMOR  01-13-2007  . TRANSURETHRAL RESECTION OF BLADDER TUMOR  04/19/2012   Procedure: TRANSURETHRAL RESECTION OF BLADDER TUMOR (TURBT);  Surgeon: Bernestine Amass, MD;  Location: [facility];  Service: Urology;  Laterality: N/A;  . umbillical hernia repair  2004     Social History:  The patient  reports that he quit smoking about 10 years ago. His smoking use included cigarettes. He has a 10.00 pack-year smoking history. He has never used smokeless tobacco. He reports that he drinks alcohol. He reports that he does not use drugs.   Family History:  The patient's family history includes Coronary artery disease in his father; Diabetes in his maternal grandmother; Heart disease in his paternal grandfather.    ROS:  Please see the history of present illness. All other systems are reviewed and  Negative to the above problem except as noted.    PHYSICAL EXAM: VS:  BP 110/72   Pulse 95   Ht 5\' 9"  (1.753 m)   Wt 195 lb 1.9 oz (88.5 kg)   SpO2 99%   BMI 28.81 kg/m   GEN:Pt, in no acute distress   Looks tired   HEENT: normal  Neck: JVP normal   No carotid bruits, or masses Cardiac: RRR; no murmurs, rubs, or gallops, NO edema  Respiratory:  clear to auscultation bilaterally, normal work of breathing GI: soft, nontender, nondistended, + BS  No hepatomegaly  MS: no deformity Moving all extremities   Skin: warm and dry, no rash Neuro:  Strength and sensation are intact Psych: euthymic mood, full affect   EKG:  EKG is not ordered today    Lipid Panel    Component Value Date/Time   CHOL 123 10/10/2016 1043  TRIG 168 (H) 10/10/2016 1043   HDL 30 (L) 10/10/2016 1043   CHOLHDL 4.1 10/10/2016 1043   VLDL 34 (H) 10/10/2016 1043   LDLCALC 59 10/10/2016 1043   LDLDIRECT  96.0 07/20/2015 1035      Wt Readings from Last 3 Encounters:  07/30/17 195 lb 1.9 oz (88.5 kg)  07/27/17 198 lb (89.8 kg)  07/13/17 202 lb (91.6 kg)      ASSESSMENT AND PLAN: 1   CAD     \CP earlier today after dizzy  Probably due to low BP   I warned him about taking NTG with low BP  I would lie down instead  2  SOB / weakness/ dizziness/ heart racing   I think all is exacerbated /initiated by chemo therapy  I encouraged him to add salt to things so he retains fluid    I would also pull back on amlodipine to 5  Follow BP    3  HTN  As above pull back some  Follow at home   Call if BP low   Pt says if he doesn't take imdur he has CP   Would keep on low dose b blocker    4  HL  Keep on crestor.    5  Onc  Follow with P Ennever  I encouraged him to increase his physcial activity on regular basis.     Signed, [name], MD  07/30/2017 10:07 AM    [address], [place], [place]  [address] Phone: [phone]; Fax: [phone]

## 2017-07-30 NOTE — Patient Instructions (Signed)
Your physician has recommended you make the following change in your medication:  1.) decrease amlodipine to 5 mg once a day CALL IF BLOOD PRESSURE IS STILL IN THE 100S-110S (top number)  Try to increase your daily fluid intake.

## 2017-08-03 ENCOUNTER — Inpatient Hospital Stay: Payer: Medicare Other

## 2017-08-03 ENCOUNTER — Other Ambulatory Visit: Payer: Self-pay | Admitting: Family

## 2017-08-03 ENCOUNTER — Other Ambulatory Visit: Payer: Self-pay

## 2017-08-03 DIAGNOSIS — R739 Hyperglycemia, unspecified: Secondary | ICD-10-CM

## 2017-08-03 DIAGNOSIS — C651 Malignant neoplasm of right renal pelvis: Secondary | ICD-10-CM

## 2017-08-03 DIAGNOSIS — R112 Nausea with vomiting, unspecified: Secondary | ICD-10-CM

## 2017-08-03 DIAGNOSIS — E860 Dehydration: Secondary | ICD-10-CM

## 2017-08-03 LAB — CBC WITH DIFFERENTIAL (CANCER CENTER ONLY)
BASOS ABS: 0 10*3/uL (ref 0.0–0.1)
BASOS PCT: 1 %
EOS ABS: 0 10*3/uL (ref 0.0–0.5)
EOS PCT: 0 %
HCT: 25.1 % — ABNORMAL LOW (ref 38.7–49.9)
Hemoglobin: 8.5 g/dL — ABNORMAL LOW (ref 13.0–17.1)
LYMPHS PCT: 30 %
Lymphs Abs: 0.8 10*3/uL — ABNORMAL LOW (ref 0.9–3.3)
MCH: 32.3 pg (ref 28.0–33.4)
MCHC: 33.9 g/dL (ref 32.0–35.9)
MCV: 95.4 fL (ref 82.0–98.0)
MONO ABS: 0.3 10*3/uL (ref 0.1–0.9)
Monocytes Relative: 11 %
Neutro Abs: 1.6 10*3/uL (ref 1.5–6.5)
Neutrophils Relative %: 58 %
PLATELETS: 89 10*3/uL — AB (ref 145–400)
RBC: 2.63 MIL/uL — AB (ref 4.20–5.70)
RDW: 17.1 % — AB (ref 11.1–15.7)
WBC: 2.8 10*3/uL — AB (ref 4.0–10.0)

## 2017-08-03 LAB — CMP (CANCER CENTER ONLY)
ALK PHOS: 88 U/L — AB (ref 26–84)
ALT: 86 U/L — AB (ref 10–47)
AST: 60 U/L — AB (ref 11–38)
Albumin: 3.9 g/dL (ref 3.5–5.0)
Anion gap: 17 — ABNORMAL HIGH (ref 5–15)
BUN: 53 mg/dL — AB (ref 7–22)
CALCIUM: 9.2 mg/dL (ref 8.0–10.3)
CO2: 20 mmol/L (ref 18–33)
CREATININE: 1.9 mg/dL — AB (ref 0.60–1.20)
Chloride: 100 mmol/L (ref 98–108)
Glucose, Bld: 225 mg/dL — ABNORMAL HIGH (ref 73–118)
POTASSIUM: 3.8 mmol/L (ref 3.3–4.7)
SODIUM: 137 mmol/L (ref 128–145)
TOTAL PROTEIN: 6.8 g/dL (ref 6.4–8.1)
Total Bilirubin: 0.8 mg/dL (ref 0.2–1.6)

## 2017-08-03 LAB — LACTATE DEHYDROGENASE: LDH: 238 U/L (ref 125–245)

## 2017-08-03 MED ORDER — PROCHLORPERAZINE MALEATE 10 MG PO TABS: 10.0000 mg | ORAL_TABLET | Freq: Four times a day (QID) | ORAL | 1 refills | Status: DC | PRN

## 2017-08-03 MED ORDER — ONDANSETRON HCL 4 MG/2ML IJ SOLN: 8.0000 mg | Freq: Once | INTRAMUSCULAR | Status: AC

## 2017-08-03 MED ORDER — SODIUM CHLORIDE 0.9 % IV SOLN: Freq: Once | INTRAVENOUS | Status: DC

## 2017-08-03 MED ORDER — SODIUM CHLORIDE 0.9 % IV SOLN
Freq: Once | INTRAVENOUS | Status: AC
  Administered 2017-08-03: 12:00:00 via INTRAVENOUS

## 2017-08-03 MED ORDER — DEXAMETHASONE SODIUM PHOSPHATE 10 MG/ML IJ SOLN: 10.0000 mg | Freq: Once | INTRAMUSCULAR | Status: AC

## 2017-08-03 MED ORDER — INSULIN REGULAR HUMAN 100 UNIT/ML IJ SOLN
10.0000 [IU] | Freq: Once | INTRAMUSCULAR | Status: AC
  Administered 2017-08-03: 10 [IU] via SUBCUTANEOUS

## 2017-08-03 MED ORDER — SODIUM CHLORIDE 0.9 % IV SOLN: 8.0000 mg | Freq: Once | INTRAVENOUS | Status: DC

## 2017-08-03 MED ORDER — SODIUM CHLORIDE 0.9 % IV SOLN
Freq: Once | INTRAVENOUS | Status: DC
  Administered 2017-08-03: 12:00:00 via INTRAVENOUS

## 2017-08-03 MED ORDER — LORAZEPAM 0.5 MG PO TABS: 0.5000 mg | ORAL_TABLET | Freq: Four times a day (QID) | ORAL | 0 refills | Status: DC | PRN

## 2017-08-03 MED ORDER — DEXAMETHASONE SODIUM PHOSPHATE 10 MG/ML IJ SOLN
INTRAMUSCULAR | Status: AC
  Filled 2017-08-03: qty 1

## 2017-08-03 MED ORDER — SODIUM CHLORIDE 0.9 % IV SOLN: 1000.0000 mL | Freq: Once | INTRAVENOUS | Status: DC

## 2017-08-03 MED ORDER — INSULIN REGULAR HUMAN 100 UNIT/ML IJ SOLN: 10.0000 [IU] | Freq: Once | INTRAMUSCULAR | Status: DC

## 2017-08-03 MED ORDER — ONDANSETRON HCL 4 MG/2ML IJ SOLN
INTRAMUSCULAR | Status: AC
  Filled 2017-08-03: qty 4

## 2017-08-03 MED FILL — LORazepam 0.5 MG TABS: 0.5 | 8 days supply | Qty: 30 | Fill #0

## 2017-08-03 MED FILL — PROCHLORPERAZINE 10 MG TAB: 10 | 8 days supply | Qty: 30 | Fill #0

## 2017-08-03 NOTE — Patient Instructions (Signed)

## 2017-08-03 NOTE — Patient Instructions (Signed)
Dehydration, Adult Dehydration is when there is not enough fluid or water in your body. This happens when you lose more fluids than you take in. Dehydration can range from mild to very bad. It should be treated right away to keep it from getting very bad. Symptoms of mild dehydration may include:  Thirst.  Dry lips.  Slightly dry mouth.  Dry, warm skin.  Dizziness. Symptoms of moderate dehydration may include:  Very dry mouth.  Muscle cramps.  Dark pee (urine). Pee may be the color of tea.  Your body making less pee.  Your eyes making fewer tears.  Heartbeat that is uneven or faster than normal (palpitations).  Headache.  Light-headedness, especially when you stand up from sitting.  Fainting (syncope). Symptoms of very bad dehydration may include:  Changes in skin, such as: ? Cold and clammy skin. ? Blotchy (mottled) or pale skin. ? Skin that does not quickly return to normal after being lightly pinched and let go (poor skin turgor).  Changes in body fluids, such as: ? Feeling very thirsty. ? Your eyes making fewer tears. ? Not sweating when body temperature is high, such as in hot weather. ? Your body making very little pee.  Changes in vital signs, such as: ? Weak pulse. ? Pulse that is more than 100 beats a minute when you are sitting still. ? Fast breathing. ? Low blood pressure.  Other changes, such as: ? Sunken eyes. ? Cold hands and feet. ? Confusion. ? Lack of energy (lethargy). ? Trouble waking up from sleep. ? Short-term weight loss. ? Unconsciousness. Follow these instructions at home:  If told by your doctor, drink an ORS: ? Make an ORS by using instructions on the package. ? Start by drinking small amounts, about  cup (120 mL) every 5-10 minutes. ? Slowly drink more until you have had the amount that your doctor said to have.  Drink enough clear fluid to keep your pee clear or pale yellow. If you were told to drink an ORS, finish the ORS  first, then start slowly drinking clear fluids. Drink fluids such as: ? Water. Do not drink only water by itself. Doing that can make the salt (sodium) level in your body get too low (hyponatremia). ? Ice chips. ? Fruit juice that you have added water to (diluted). ? Low-calorie sports drinks.  Avoid: ? Alcohol. ? Drinks that have a lot of sugar. These include high-calorie sports drinks, fruit juice that does not have water added, and soda. ? Caffeine. ? Foods that are greasy or have a lot of fat or sugar.  Take over-the-counter and prescription medicines only as told by your doctor.  Do not take salt tablets. Doing that can make the salt level in your body get too high (hypernatremia).  Eat foods that have minerals (electrolytes). Examples include bananas, oranges, potatoes, tomatoes, and spinach.  Keep all follow-up visits as told by your doctor. This is important. Contact a doctor if:  You have belly (abdominal) pain that: ? Gets worse. ? Stays in one area (localizes).  You have a rash.  You have a stiff neck.  You get angry or annoyed more easily than normal (irritability).  You are more sleepy than normal.  You have a harder time waking up than normal.  You feel: ? Weak. ? Dizzy. ? Very thirsty.  You have peed (urinated) only a small amount of very dark pee during 6-8 hours. Get help right away if:  You have symptoms of   very bad dehydration.  You cannot drink fluids without throwing up (vomiting).  Your symptoms get worse with treatment.  You have a fever.  You have a very bad headache.  You are throwing up or having watery poop (diarrhea) and it: ? Gets worse. ? Does not go away.  You have blood or something green (bile) in your throw-up.  You have blood in your poop (stool). This may cause poop to look black and tarry.  You have not peed in 6-8 hours.  You pass out (faint).  Your heart rate when you are sitting still is more than 100 beats a  minute.  You have trouble breathing. This information is not intended to replace advice given to you by your health care provider. Make sure you discuss any questions you have with your health care provider. Document Released: 02/22/2009 Document Revised: 11/16/2015 Document Reviewed: 06/22/2015 Elsevier Interactive Patient Education  2018 [employer]  

## 2017-08-03 NOTE — Progress Notes (Signed)
No treatment today per Dr [name] d/t nausea/vomiting/weakness and low platelets. New orders administered as prescribed. No Neulasta today or tomorrow per Dr [name]. dph

## 2017-08-04 ENCOUNTER — Inpatient Hospital Stay: Payer: Medicare Other

## 2017-08-14 ENCOUNTER — Telehealth: Payer: Self-pay | Admitting: *Deleted

## 2017-08-14 NOTE — Telephone Encounter (Signed)
Patient c/o continued nausea even after stopping chemo on 07/27/2017. He states he can tolerate a liquid diet. He drinks Boost, Ensure, [employer], Milkshakes etc. He is unable to tolerate solid food. He states texture bothers him. When he tries to eat, he either has gagging episodes, or will often vomit several hours after eating.   Reviewed all his meds with him. He doesn't take Marinol due to cost. He stopped taking Zyprexa because he thought it was causing muscle aches. He takes the ativan, compazine, and zofran only when he's moderately nauseous or more.   Reviewed symptoms with Dr [name]. He would like patient to try the Zyrexa again. He also wants patient to start taking Zofran every 8 hours scheduled. The patient is also advised to start a stool softener, or to use laxatives as needed since zofran can cause constipation.   Patient is aware of recommendation and will follow medication regimen. He will follow up with the office next week with how effective the treatment was.

## 2017-08-26 ENCOUNTER — Encounter (HOSPITAL_COMMUNITY)
Admission: RE | Admit: 2017-08-26 | Discharge: 2017-08-26 | Disposition: A | Discharge status: Home / Self Care | Payer: Medicare Other | Source: Ambulatory Visit | Attending: Hematology & Oncology | Admitting: Hematology & Oncology

## 2017-08-26 DIAGNOSIS — C651 Malignant neoplasm of right renal pelvis: Secondary | ICD-10-CM | POA: Diagnosis not present

## 2017-08-26 LAB — GLUCOSE, CAPILLARY: Glucose-Capillary: 172 mg/dL — ABNORMAL HIGH (ref 65–99)

## 2017-08-26 MED ORDER — FLUDEOXYGLUCOSE F - 18 (FDG) INJECTION
9.8000 | Freq: Once | INTRAVENOUS | Status: AC | PRN
  Administered 2017-08-26: 9.8 via INTRAVENOUS

## 2017-08-28 ENCOUNTER — Other Ambulatory Visit: Payer: Self-pay | Admitting: Hematology & Oncology

## 2017-08-28 ENCOUNTER — Telehealth: Payer: Self-pay | Admitting: *Deleted

## 2017-08-28 DIAGNOSIS — C651 Malignant neoplasm of right renal pelvis: Secondary | ICD-10-CM

## 2017-08-28 NOTE — Telephone Encounter (Addendum)
Patient is aware of results  ----- Message from [name], MD sent at 08/27/2017  2:49 PM EDT ----- Call - NO cancer on the PET scan!!  [facility]

## 2017-09-01 ENCOUNTER — Encounter: Payer: Self-pay | Admitting: Hematology & Oncology

## 2017-09-01 DIAGNOSIS — E119 Type 2 diabetes mellitus without complications: Secondary | ICD-10-CM | POA: Diagnosis not present

## 2017-09-01 DIAGNOSIS — H25013 Cortical age-related cataract, bilateral: Secondary | ICD-10-CM | POA: Diagnosis not present

## 2017-09-01 DIAGNOSIS — H04123 Dry eye syndrome of bilateral lacrimal glands: Secondary | ICD-10-CM | POA: Diagnosis not present

## 2017-09-01 DIAGNOSIS — H2513 Age-related nuclear cataract, bilateral: Secondary | ICD-10-CM | POA: Diagnosis not present

## 2017-09-04 ENCOUNTER — Other Ambulatory Visit: Payer: Self-pay | Admitting: *Deleted

## 2017-09-04 DIAGNOSIS — C651 Malignant neoplasm of right renal pelvis: Secondary | ICD-10-CM

## 2017-09-07 ENCOUNTER — Inpatient Hospital Stay: Payer: Medicare Other

## 2017-09-07 ENCOUNTER — Other Ambulatory Visit: Payer: Self-pay

## 2017-09-07 ENCOUNTER — Inpatient Hospital Stay: Payer: Medicare Other | Attending: Hematology & Oncology | Admitting: Hematology & Oncology

## 2017-09-07 ENCOUNTER — Other Ambulatory Visit: Payer: Self-pay | Admitting: *Deleted

## 2017-09-07 ENCOUNTER — Encounter: Payer: Self-pay | Admitting: Hematology & Oncology

## 2017-09-07 VITALS — BP 117/74 | HR 75 | Temp 98.6°F | Resp 18 | Wt 199.0 lb

## 2017-09-07 DIAGNOSIS — Z794 Long term (current) use of insulin: Secondary | ICD-10-CM | POA: Diagnosis not present

## 2017-09-07 DIAGNOSIS — C651 Malignant neoplasm of right renal pelvis: Secondary | ICD-10-CM

## 2017-09-07 DIAGNOSIS — D649 Anemia, unspecified: Secondary | ICD-10-CM

## 2017-09-07 DIAGNOSIS — H9190 Unspecified hearing loss, unspecified ear: Secondary | ICD-10-CM | POA: Insufficient documentation

## 2017-09-07 DIAGNOSIS — Z79899 Other long term (current) drug therapy: Secondary | ICD-10-CM | POA: Insufficient documentation

## 2017-09-07 DIAGNOSIS — R630 Anorexia: Secondary | ICD-10-CM

## 2017-09-07 DIAGNOSIS — Z905 Acquired absence of kidney: Secondary | ICD-10-CM | POA: Diagnosis not present

## 2017-09-07 DIAGNOSIS — R5383 Other fatigue: Secondary | ICD-10-CM | POA: Insufficient documentation

## 2017-09-07 DIAGNOSIS — Z9221 Personal history of antineoplastic chemotherapy: Secondary | ICD-10-CM | POA: Insufficient documentation

## 2017-09-07 DIAGNOSIS — D500 Iron deficiency anemia secondary to blood loss (chronic): Secondary | ICD-10-CM

## 2017-09-07 LAB — CBC WITH DIFFERENTIAL (CANCER CENTER ONLY)
Basophils Absolute: 0 10*3/uL (ref 0.0–0.1)
Basophils Relative: 1 %
EOS ABS: 0.2 10*3/uL (ref 0.0–0.5)
Eosinophils Relative: 5 %
HCT: 30.3 % — ABNORMAL LOW (ref 38.7–49.9)
HEMOGLOBIN: 10.2 g/dL — AB (ref 13.0–17.1)
LYMPHS ABS: 1.1 10*3/uL (ref 0.9–3.3)
Lymphocytes Relative: 32 %
MCH: 34.2 pg — AB (ref 28.0–33.4)
MCHC: 33.7 g/dL (ref 32.0–35.9)
MCV: 101.7 fL — ABNORMAL HIGH (ref 82.0–98.0)
Monocytes Absolute: 0.5 10*3/uL (ref 0.1–0.9)
Monocytes Relative: 14 %
NEUTROS PCT: 48 %
Neutro Abs: 1.6 10*3/uL (ref 1.5–6.5)
Platelet Count: 108 10*3/uL — ABNORMAL LOW (ref 145–400)
RBC: 2.98 MIL/uL — AB (ref 4.20–5.70)
RDW: 14.2 % (ref 11.1–15.7)
WBC: 3.4 10*3/uL — AB (ref 4.0–10.0)

## 2017-09-07 LAB — CMP (CANCER CENTER ONLY)
ALK PHOS: 65 U/L (ref 26–84)
ALT: 50 U/L — AB (ref 10–47)
AST: 68 U/L — ABNORMAL HIGH (ref 11–38)
Albumin: 3.6 g/dL (ref 3.5–5.0)
Anion gap: 11 (ref 5–15)
BUN: 31 mg/dL — AB (ref 7–22)
CALCIUM: 8.7 mg/dL (ref 8.0–10.3)
CO2: 25 mmol/L (ref 18–33)
Chloride: 98 mmol/L (ref 98–108)
Creatinine: 2.1 mg/dL — ABNORMAL HIGH (ref 0.60–1.20)
Glucose, Bld: 277 mg/dL — ABNORMAL HIGH (ref 73–118)
Potassium: 4.5 mmol/L (ref 3.3–4.7)
SODIUM: 134 mmol/L (ref 128–145)
TOTAL PROTEIN: 6.4 g/dL (ref 6.4–8.1)
Total Bilirubin: 0.8 mg/dL (ref 0.2–1.6)

## 2017-09-07 LAB — LACTATE DEHYDROGENASE: LDH: 220 U/L (ref 125–245)

## 2017-09-07 MED ORDER — ALPRAZOLAM 0.5 MG PO TABS
0.5000 mg | ORAL_TABLET | Freq: Three times a day (TID) | ORAL | 1 refills | Status: DC | PRN
Start: 2017-09-07 — End: 2017-12-16

## 2017-09-07 MED ORDER — AMOXICILLIN 500 MG PO TABS: ORAL_TABLET | ORAL | 2 refills | Status: DC

## 2017-09-07 MED FILL — AMOXICILLIN 500 MG CAPSULE: 500 | 1 days supply | Qty: 4 | Fill #0

## 2017-09-07 NOTE — Addendum Note (Signed)
Addended by: [name] on: 09/07/2017 02:37 PM   Modules accepted: Orders

## 2017-09-07 NOTE — Progress Notes (Signed)
Hematology and Oncology Follow Up Visit  Isaac [name] [id number] 05/12/1956 61 y.o. 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 is here today for follow-up.  He is having a little bit of a tough time.  He completed his chemo therapy about 6 weeks ago.  We did go ahead and do a PET scan on him.  Thankfully, the PET scan, which was done on 08/26/2017 did not show any evidence of residual/recurrent disease.  He is having problems with toxicity from treatment.  He does not have any taste for food.  He says he has a hard time chewing food because of taste.  He feels tired.  He feels rundown.  He is having a hard time hearing.  He is not complaining of any tinnitus.  I told him that it probably will take 6 months before we see improvement in his side effects.  Since he had 6 cycles of treatment, it may take 6 months before he recovers.  He only has one kidney.  His creatinine is up a little bit.  This, I think will affect his anemia and his ability to make blood.  He has had no issues with fever.  He has had no cough.  He has had no nausea or vomiting.    Overall, his performance status is ECOG 1.  Medications:  Allergies as of 09/07/2017      Reactions   Dilaudid [hydromorphone] Nausea And Vomiting   Hydrocodone Itching, Nausea And Vomiting      Medication List        Accurate as of 09/07/17  1:21 PM. Always use your most recent med list.          ALPRAZolam 0.5 MG tablet Commonly known as:  XANAX Take 1 tablet (0.5 mg total) by mouth 3 (three) times daily as needed for anxiety.   amLODipine 5 MG tablet Commonly known as:  NORVASC Take 1 tablet (5 mg total) by mouth daily. TAKE 1 TABLET BY MOUTH EVERY DAY.   baclofen 10 MG  tablet Commonly known as:  LIORESAL Take 0.5 tablets (5 mg total) by mouth every 12 (twelve) hours as needed for muscle spasms.   dexamethasone 4 MG tablet Commonly known as:  DECADRON Take 2 tablets by mouth once a day on the day after cisplatin chemotherapy and then take 2 tablets two times a day for 2 days. Take with food.   docusate sodium 100 MG capsule Commonly known as:  COLACE Take 1 capsule (100 mg total) by mouth 2 (two) times daily as needed (take to keep stool soft.).   glimepiride 2 MG tablet Commonly known as:  AMARYL TAKE 1 BY MOUTH DAILY BEFORE SUPPER   INSULIN GLARGINE [place] Inject 30 Units into the skin.   isosorbide mononitrate 120 MG 24 hr tablet Commonly known as:  IMDUR Take 1 tablet (120 mg total) by mouth daily.   lidocaine-prilocaine cream Commonly known as:  EMLA Apply to affected area once   LORazepam 0.5 MG tablet Commonly known as:  ATIVAN Take 1 tablet (0.5 mg total) by mouth every 6 (six) hours as needed (Nausea or vomiting).   megestrol 400 MG/10ML suspension Commonly known as:  MEGACE Take 10 mLs (400 mg total) by mouth 2 (two) times daily.   metFORMIN 1000 MG tablet  Commonly known as:  GLUCOPHAGE Take 1 tablet (1,000 mg total) by mouth 2 (two) times daily with a meal.   metoprolol succinate 25 MG 24 hr tablet Commonly known as:  TOPROL-XL Take 1 tablet (25 mg total) at bedtime by mouth.   nitroGLYCERIN 0.4 MG SL tablet Commonly known as:  NITROSTAT Place 1 tablet (0.4 mg total) under the tongue every 5 (five) minutes as needed for chest pain.   OLANZapine 10 MG tablet Commonly known as:  ZYPREXA Take 1 tablet (10 mg total) by mouth at bedtime.   ondansetron 8 MG tablet Commonly known as:  ZOFRAN Take 1 tablet (8 mg total) 2 (two) times daily as needed by mouth. Start on the third day after cisplatin chemotherapy.   pantoprazole 40 MG tablet Commonly known as:  PROTONIX Take 1 tablet (40 mg total) by mouth every morning.   Pen  Needles 32G X 4 MM Misc by Does not apply route.   BD PEN NEEDLE NANO U/F 32G X 4 MM Misc Generic drug:  Insulin Pen Needle U ONCE D   prochlorperazine 10 MG tablet Commonly known as:  COMPAZINE Take 1 tablet (10 mg total) by mouth every 6 (six) hours as needed (Nausea or vomiting).   rOPINIRole 2 MG tablet Commonly known as:  REQUIP Take 1 tablet (2 mg total) by mouth at bedtime.   rosuvastatin 10 MG tablet Commonly known as:  CRESTOR TAKE 1 TAB (10 MG) BY MOUTH EVERY OTHER DAY       Allergies:  Allergies  Allergen Reactions  . Dilaudid [Hydromorphone] Nausea And Vomiting  . Hydrocodone Itching and Nausea And Vomiting    Past Medical History, Surgical history, Social history, and Family History were reviewed and updated.  Review of Systems: Review of Systems  Constitutional: Negative.   HENT: Positive for tinnitus.   Eyes: Negative.   Respiratory: Negative.   Cardiovascular: Negative.   Gastrointestinal: Positive for nausea.  Genitourinary: Negative.   Musculoskeletal: Negative.   Skin: Negative.   Neurological: Negative.   Endo/Heme/Allergies: Negative.   Psychiatric/Behavioral: Negative.      Physical Exam:  weight is 199 lb (90.3 kg). His oral temperature is 98.6 F (37 C). His blood pressure is 117/74 and his pulse is 75. His respiration is 18 and oxygen saturation is 99%.   Wt Readings from Last 3 Encounters:  09/07/17 199 lb (90.3 kg)  08/03/17 (P) 159 lb (72.1 kg)  07/30/17 195 lb 1.9 oz (88.5 kg)    Physical Exam  Constitutional: He is oriented to person, place, and time.  HENT:  Head: Normocephalic and atraumatic.  Mouth/Throat: Oropharynx is clear and moist.  Eyes: Pupils are equal, round, and reactive to light. EOM are normal.  Neck: Normal range of motion.  Cardiovascular: Normal rate, regular rhythm and normal heart sounds.  Pulmonary/Chest: Effort normal and breath sounds normal.  Abdominal: Soft. Bowel sounds are normal.   Musculoskeletal: Normal range of motion. He exhibits no edema, tenderness or deformity.  Lymphadenopathy:    He has no cervical adenopathy.  Neurological: He is alert and oriented to person, place, and time.  Skin: Skin is warm and dry. No rash noted. No erythema.  Psychiatric: He has a normal mood and affect. His behavior is normal. Judgment and thought content normal.  Vitals reviewed.   Lab Results  Component Value Date   WBC 3.4 (L) 09/07/2017   HGB 10.2 (L) 09/07/2017   HCT 30.3 (L) 09/07/2017   MCV 101.7 (H) 09/07/2017  PLT 108 (L) 09/07/2017   Lab Results  Component Value Date   FERRITIN 2,122 (H) 07/27/2017   IRON 213 (H) 07/27/2017   TIBC 258 07/27/2017   UIBC 45 07/27/2017   IRONPCTSAT 83 07/27/2017   Lab Results  Component Value Date   RETICCTPCT 4.9 (H) 07/06/2017   RBC 2.98 (L) 09/07/2017   No results found for: Nils Pyle, [facility] Lab Results  Component Value Date   IGA 232 01/25/2015   No results found for: Odetta Pink, SPEI   Chemistry      Component Value Date/Time   NA 134 09/07/2017 1205   NA 134 05/04/2017 0925   K 4.5 09/07/2017 1205   K 4.5 05/04/2017 0925   CL 98 09/07/2017 1205   CL 97 (L) 05/04/2017 0925   CO2 25 09/07/2017 1205   CO2 23 05/04/2017 0925   BUN 31 (H) 09/07/2017 1205   BUN 28 (H) 05/04/2017 0925   CREATININE 2.10 (H) 09/07/2017 1205   CREATININE 1.2 05/04/2017 0925   GLU 86 03/31/2016      Component Value Date/Time   CALCIUM 8.7 09/07/2017 1205   CALCIUM 9.3 05/04/2017 0925   ALKPHOS 65 09/07/2017 1205   ALKPHOS 58 05/04/2017 0925   AST 68 (H) 09/07/2017 1205   ALT 50 (H) 09/07/2017 1205   ALT 90 (H) 05/04/2017 0925   BILITOT 0.8 09/07/2017 1205      Impression and Plan: [name] is a very pleasant 62 yo caucasian gentleman with infiltrating high grade papillary urothelial carcinoma with right nephrectomy in October 2018.    at this  point, we will have to see how he recovers.  I do not see that we have to do any scans on him right now.  His blood sugars are on the high side and there is also I think is affecting him.  He comes in with his sister.  I went over his PET scan and his labs.  I spent about 35 minutes with he and his sister.  Over half time was spent face-to-face with them.  He still has the Port-A-Cath in.  I will keep this in for right now.  Will see him back in 6 weeks.    [name], MD 4/29/20191:21 PM

## 2017-09-07 NOTE — Patient Instructions (Signed)

## 2017-09-08 MED FILL — ALPRAZolam 0.5 MG TABS: 0.5 | 13 days supply | Qty: 40 | Fill #0

## 2017-09-11 MED FILL — DRONABINOL 5 MG CAPSULE: 5 | 30 days supply | Qty: 60 | Fill #0

## 2017-09-17 DIAGNOSIS — E1165 Type 2 diabetes mellitus with hyperglycemia: Secondary | ICD-10-CM | POA: Diagnosis not present

## 2017-09-17 DIAGNOSIS — Z794 Long term (current) use of insulin: Secondary | ICD-10-CM | POA: Diagnosis not present

## 2017-10-02 DIAGNOSIS — E119 Type 2 diabetes mellitus without complications: Secondary | ICD-10-CM | POA: Diagnosis not present

## 2017-10-06 DIAGNOSIS — E119 Type 2 diabetes mellitus without complications: Secondary | ICD-10-CM | POA: Diagnosis not present

## 2017-10-08 ENCOUNTER — Other Ambulatory Visit: Payer: Self-pay | Admitting: Internal Medicine

## 2017-10-08 DIAGNOSIS — G2581 Restless legs syndrome: Secondary | ICD-10-CM

## 2017-10-15 ENCOUNTER — Encounter: Payer: Self-pay | Admitting: Hematology & Oncology

## 2017-10-15 ENCOUNTER — Other Ambulatory Visit: Payer: Self-pay

## 2017-10-15 ENCOUNTER — Inpatient Hospital Stay: Payer: Medicare Other

## 2017-10-15 ENCOUNTER — Inpatient Hospital Stay (HOSPITAL_BASED_OUTPATIENT_CLINIC_OR_DEPARTMENT_OTHER): Payer: Medicare Other | Admitting: Hematology & Oncology

## 2017-10-15 ENCOUNTER — Inpatient Hospital Stay: Payer: Medicare Other | Attending: Hematology & Oncology

## 2017-10-15 VITALS — BP 101/66 | HR 69 | Temp 97.9°F | Resp 20 | Wt 200.0 lb

## 2017-10-15 DIAGNOSIS — H9190 Unspecified hearing loss, unspecified ear: Secondary | ICD-10-CM

## 2017-10-15 DIAGNOSIS — Z79899 Other long term (current) drug therapy: Secondary | ICD-10-CM | POA: Insufficient documentation

## 2017-10-15 DIAGNOSIS — R630 Anorexia: Secondary | ICD-10-CM | POA: Insufficient documentation

## 2017-10-15 DIAGNOSIS — Z8553 Personal history of malignant neoplasm of renal pelvis: Secondary | ICD-10-CM

## 2017-10-15 DIAGNOSIS — R110 Nausea: Secondary | ICD-10-CM | POA: Insufficient documentation

## 2017-10-15 DIAGNOSIS — G629 Polyneuropathy, unspecified: Secondary | ICD-10-CM | POA: Insufficient documentation

## 2017-10-15 DIAGNOSIS — R42 Dizziness and giddiness: Secondary | ICD-10-CM

## 2017-10-15 DIAGNOSIS — Z794 Long term (current) use of insulin: Secondary | ICD-10-CM | POA: Diagnosis not present

## 2017-10-15 DIAGNOSIS — H9319 Tinnitus, unspecified ear: Secondary | ICD-10-CM | POA: Insufficient documentation

## 2017-10-15 DIAGNOSIS — E119 Type 2 diabetes mellitus without complications: Secondary | ICD-10-CM

## 2017-10-15 DIAGNOSIS — Z95828 Presence of other vascular implants and grafts: Secondary | ICD-10-CM

## 2017-10-15 DIAGNOSIS — C651 Malignant neoplasm of right renal pelvis: Secondary | ICD-10-CM

## 2017-10-15 DIAGNOSIS — Z9221 Personal history of antineoplastic chemotherapy: Secondary | ICD-10-CM

## 2017-10-15 DIAGNOSIS — H9193 Unspecified hearing loss, bilateral: Secondary | ICD-10-CM

## 2017-10-15 DIAGNOSIS — D500 Iron deficiency anemia secondary to blood loss (chronic): Secondary | ICD-10-CM

## 2017-10-15 LAB — IRON AND TIBC
Iron: 71 ug/dL (ref 42–163)
SATURATION RATIOS: 24 % — AB (ref 42–163)
TIBC: 299 ug/dL (ref 202–409)
UIBC: 227 ug/dL

## 2017-10-15 LAB — CBC WITH DIFFERENTIAL (CANCER CENTER ONLY)
Basophils Absolute: 0 10*3/uL (ref 0.0–0.1)
Basophils Relative: 1 %
EOS PCT: 7 %
Eosinophils Absolute: 0.2 10*3/uL (ref 0.0–0.5)
HEMATOCRIT: 32.4 % — AB (ref 38.7–49.9)
Hemoglobin: 11.1 g/dL — ABNORMAL LOW (ref 13.0–17.1)
LYMPHS ABS: 1.2 10*3/uL (ref 0.9–3.3)
LYMPHS PCT: 37 %
MCH: 34 pg — AB (ref 28.0–33.4)
MCHC: 34.3 g/dL (ref 32.0–35.9)
MCV: 99.4 fL — AB (ref 82.0–98.0)
MONO ABS: 0.4 10*3/uL (ref 0.1–0.9)
Monocytes Relative: 13 %
NEUTROS ABS: 1.3 10*3/uL — AB (ref 1.5–6.5)
Neutrophils Relative %: 42 %
PLATELETS: 115 10*3/uL — AB (ref 145–400)
RBC: 3.26 MIL/uL — ABNORMAL LOW (ref 4.20–5.70)
RDW: 12.7 % (ref 11.1–15.7)
WBC Count: 3.2 10*3/uL — ABNORMAL LOW (ref 4.0–10.0)

## 2017-10-15 LAB — CMP (CANCER CENTER ONLY)
ALT: 41 U/L (ref 10–47)
ANION GAP: 10 (ref 5–15)
AST: 44 U/L — ABNORMAL HIGH (ref 11–38)
Albumin: 3.5 g/dL (ref 3.5–5.0)
Alkaline Phosphatase: 62 U/L (ref 26–84)
BILIRUBIN TOTAL: 0.7 mg/dL (ref 0.2–1.6)
BUN: 23 mg/dL — ABNORMAL HIGH (ref 7–22)
CALCIUM: 9.1 mg/dL (ref 8.0–10.3)
CO2: 26 mmol/L (ref 18–33)
Chloride: 103 mmol/L (ref 98–108)
Creatinine: 1.8 mg/dL — ABNORMAL HIGH (ref 0.60–1.20)
Glucose, Bld: 232 mg/dL — ABNORMAL HIGH (ref 73–118)
POTASSIUM: 3.6 mmol/L (ref 3.3–4.7)
Sodium: 139 mmol/L (ref 128–145)
TOTAL PROTEIN: 6.2 g/dL — AB (ref 6.4–8.1)

## 2017-10-15 LAB — FERRITIN: Ferritin: 270 ng/mL (ref 22–316)

## 2017-10-15 LAB — LACTATE DEHYDROGENASE: LDH: 162 U/L (ref 125–245)

## 2017-10-15 LAB — RETICULOCYTES
RBC.: 3.29 MIL/uL — AB (ref 4.20–5.82)
RETIC COUNT ABSOLUTE: 72.4 10*3/uL (ref 34.8–93.9)
RETIC CT PCT: 2.2 % — AB (ref 0.8–1.8)

## 2017-10-15 MED ORDER — HEPARIN SOD (PORK) LOCK FLUSH 100 UNIT/ML IV SOLN
500.0000 [IU] | Freq: Once | INTRAVENOUS | Status: AC
  Administered 2017-10-15: 500 [IU] via INTRAVENOUS
  Filled 2017-10-15: qty 5

## 2017-10-15 MED ORDER — PREGABALIN 75 MG PO CAPS: 75.0000 mg | ORAL_CAPSULE | Freq: Two times a day (BID) | ORAL | 4 refills | Status: DC

## 2017-10-15 MED ORDER — SODIUM CHLORIDE 0.9% FLUSH
10.0000 mL | INTRAVENOUS | Status: DC | PRN
  Administered 2017-10-15: 10 mL via INTRAVENOUS
  Filled 2017-10-15: qty 10

## 2017-10-15 MED FILL — LYRICA 75 MG CAPSULE: 75 | 30 days supply | Qty: 60 | Fill #0

## 2017-10-15 NOTE — Patient Instructions (Signed)
Implanted Port Home Guide An implanted port is a type of central line that is placed under the skin. Central lines are used to provide IV access when treatment or nutrition needs to be given through a person's veins. Implanted ports are used for long-term IV access. An implanted port may be placed because:  You need IV medicine that would be irritating to the small veins in your hands or arms.  You need long-term IV medicines, such as antibiotics.  You need IV nutrition for a long period.  You need frequent blood draws for lab tests.  You need dialysis.  Implanted ports are usually placed in the chest area, but they can also be placed in the upper arm, the abdomen, or the leg. An implanted port has two main parts:  Reservoir. The reservoir is round and will appear as a small, raised area under your skin. The reservoir is the part where a needle is inserted to give medicines or draw blood.  Catheter. The catheter is a thin, flexible tube that extends from the reservoir. The catheter is placed into a large vein. Medicine that is inserted into the reservoir goes into the catheter and then into the vein.  How will I care for my incision site? Do not get the incision site wet. Bathe or shower as directed by your health care provider. How is my port accessed? Special steps must be taken to access the port:  Before the port is accessed, a numbing cream can be placed on the skin. This helps numb the skin over the port site.  Your health care provider uses a sterile technique to access the port. ? Your health care provider must put on a mask and sterile gloves. ? The skin over your port is cleaned carefully with an antiseptic and allowed to dry. ? The port is gently pinched between sterile gloves, and a needle is inserted into the port.  Only "non-coring" port needles should be used to access the port. Once the port is accessed, a blood return should be checked. This helps ensure that the port  is in the vein and is not clogged.  If your port needs to remain accessed for a constant infusion, a clear (transparent) bandage will be placed over the needle site. The bandage and needle will need to be changed every week, or as directed by your health care provider.  Keep the bandage covering the needle clean and dry. Do not get it wet. Follow your health care provider's instructions on how to take a shower or bath while the port is accessed.  If your port does not need to stay accessed, no bandage is needed over the port.  What is flushing? Flushing helps keep the port from getting clogged. Follow your health care provider's instructions on how and when to flush the port. Ports are usually flushed with saline solution or a medicine called heparin. The need for flushing will depend on how the port is used.  If the port is used for intermittent medicines or blood draws, the port will need to be flushed: ? After medicines have been given. ? After blood has been drawn. ? As part of routine maintenance.  If a constant infusion is running, the port may not need to be flushed.  How long will my port stay implanted? The port can stay in for as long as your health care provider thinks it is needed. When it is time for the port to come out, surgery will be   done to remove it. The procedure is similar to the one performed when the port was put in. When should I seek immediate medical care? When you have an implanted port, you should seek immediate medical care if:  You notice a bad smell coming from the incision site.  You have swelling, redness, or drainage at the incision site.  You have more swelling or pain at the port site or the surrounding area.  You have a fever that is not controlled with medicine.  This information is not intended to replace advice given to you by your health care provider. Make sure you discuss any questions you have with your health care provider. Document  Released: 04/28/2005 Document Revised: 10/04/2015 Document Reviewed: 01/03/2013 Elsevier Interactive Patient Education  2017 Elsevier Inc.  

## 2017-10-15 NOTE — Progress Notes (Signed)
Hematology and Oncology Follow Up Visit  Isaac [name] [id number] 05-26-1955 62 y.o. 10/15/2017   Principle Diagnosis:  Stage III (T3NxM0)Infiltrating high grade papillary urothelial carcinomaof the RIGHT renal hilum -right nephroureterectomy on 02/12/2017  Current Therapy:   Cisplatin/Gemcitabine s/pcycle #6 - completed on 07/27/2017   Interim History:  Isaac [name] is here today for follow-up.  He is feeling a little bit better.  However, he has a couple new issues.  He says that he cannot hear as well.  He says that high-pitched tones he cannot hear.  I think this probably is from the cis-platinum.  I will have to see about making a referral to audiology for him.  He also says that there is neuropathy in his feet.  There is none in his hands.  Again this might be from the cis-platinum or it could be from his diabetes.  I will go ahead and call in some Lyrica (75 mg p.o. twice daily) to try to help with this.  He says that he still cannot eat all that well.  He is still does not have much taste for food.  He says he does get dizzy when he stands up.  I think this might be a little bit of hypotension from his blood pressure medications.  I told him to stop his Norvasc.  He has had no fever.  He has had no bleeding.  He has had no diarrhea.     Overall, his performance status is ECOG 1.  Medications:  Allergies as of 10/15/2017      Reactions   Dilaudid [hydromorphone] Nausea And Vomiting   Hydrocodone Itching, Nausea And Vomiting      Medication List        Accurate as of 10/15/17  9:28 AM. Always use your most recent med list.          ALPRAZolam 0.5 MG tablet Commonly known as:  XANAX Take 1 tablet (0.5 mg total) by mouth 3 (three) times daily as needed for anxiety.   amLODipine 10 MG tablet Commonly known as:  NORVASC Take 10 mg by mouth daily.   amoxicillin 500 MG tablet Commonly known as:  AMOXIL Take 4 capsules 1 hour prior to dental work   baclofen 10  MG tablet Commonly known as:  LIORESAL Take 0.5 tablets (5 mg total) by mouth every 12 (twelve) hours as needed for muscle spasms.   dexamethasone 4 MG tablet Commonly known as:  DECADRON Take 2 tablets by mouth once a day on the day after cisplatin chemotherapy and then take 2 tablets two times a day for 2 days. Take with food.   docusate sodium 100 MG capsule Commonly known as:  COLACE Take 1 capsule (100 mg total) by mouth 2 (two) times daily as needed (take to keep stool soft.).   dronabinol 5 MG capsule Commonly known as:  MARINOL Take 5 mg by mouth daily.   glimepiride 2 MG tablet Commonly known as:  AMARYL TAKE 1 BY MOUTH DAILY BEFORE SUPPER   INSULIN GLARGINE [place] Inject 30 Units into the skin.   isosorbide mononitrate 120 MG 24 hr tablet Commonly known as:  IMDUR Take 1 tablet (120 mg total) by mouth daily.   lidocaine-prilocaine cream Commonly known as:  EMLA Apply to affected area once   LORazepam 0.5 MG tablet Commonly known as:  ATIVAN Take 1 tablet (0.5 mg total) by mouth every 6 (six) hours as needed (Nausea or vomiting).   megestrol 400 MG/10ML suspension  Commonly known as:  MEGACE Take 10 mLs (400 mg total) by mouth 2 (two) times daily.   metFORMIN 1000 MG tablet Commonly known as:  GLUCOPHAGE Take 1 tablet (1,000 mg total) by mouth 2 (two) times daily with a meal.   metoprolol succinate 25 MG 24 hr tablet Commonly known as:  TOPROL-XL Take 1 tablet (25 mg total) at bedtime by mouth.   nitroGLYCERIN 0.4 MG SL tablet Commonly known as:  NITROSTAT Place 1 tablet (0.4 mg total) under the tongue every 5 (five) minutes as needed for chest pain.   OLANZapine 10 MG tablet Commonly known as:  ZYPREXA Take 1 tablet (10 mg total) by mouth at bedtime.   ondansetron 8 MG tablet Commonly known as:  ZOFRAN Take 1 tablet (8 mg total) 2 (two) times daily as needed by mouth. Start on the third day after cisplatin chemotherapy.   pantoprazole 40 MG  tablet Commonly known as:  PROTONIX Take 1 tablet (40 mg total) by mouth every morning.   Pen Needles 32G X 4 MM Misc by Does not apply route.   BD PEN NEEDLE NANO U/F 32G X 4 MM Misc Generic drug:  Insulin Pen Needle U ONCE D   prochlorperazine 10 MG tablet Commonly known as:  COMPAZINE Take 1 tablet (10 mg total) by mouth every 6 (six) hours as needed (Nausea or vomiting).   rOPINIRole 2 MG tablet Commonly known as:  REQUIP TAKE 1 TABLET BY MOUTH AT BEDTIME   rosuvastatin 10 MG tablet Commonly known as:  CRESTOR TAKE 1 TAB (10 MG) BY MOUTH EVERY OTHER DAY       Allergies:  Allergies  Allergen Reactions  . Dilaudid [Hydromorphone] Nausea And Vomiting  . Hydrocodone Itching and Nausea And Vomiting    Past Medical History, Surgical history, Social history, and Family History were reviewed and updated.  Review of Systems: Review of Systems  Constitutional: Negative.   HENT: Positive for tinnitus.   Eyes: Negative.   Respiratory: Negative.   Cardiovascular: Negative.   Gastrointestinal: Positive for nausea.  Genitourinary: Negative.   Musculoskeletal: Negative.   Skin: Negative.   Neurological: Negative.   Endo/Heme/Allergies: Negative.   Psychiatric/Behavioral: Negative.      Physical Exam:  weight is 200 lb (90.7 kg). His oral temperature is 97.9 F (36.6 C). His blood pressure is 101/66 and his pulse is 69. His respiration is 20 and oxygen saturation is 98%.   Wt Readings from Last 3 Encounters:  10/15/17 200 lb (90.7 kg)  10/15/17 200 lb (90.7 kg)  09/07/17 199 lb (90.3 kg)    Physical Exam  Constitutional: He is oriented to person, place, and time.  HENT:  Head: Normocephalic and atraumatic.  Mouth/Throat: Oropharynx is clear and moist.  Eyes: Pupils are equal, round, and reactive to light. EOM are normal.  Neck: Normal range of motion.  Cardiovascular: Normal rate, regular rhythm and normal heart sounds.  Pulmonary/Chest: Effort normal and  breath sounds normal.  Abdominal: Soft. Bowel sounds are normal.  Musculoskeletal: Normal range of motion. He exhibits no edema, tenderness or deformity.  Lymphadenopathy:    He has no cervical adenopathy.  Neurological: He is alert and oriented to person, place, and time.  Skin: Skin is warm and dry. No rash noted. No erythema.  Psychiatric: He has a normal mood and affect. His behavior is normal. Judgment and thought content normal.  Vitals reviewed.   Lab Results  Component Value Date   WBC 3.2 (L) 10/15/2017  HGB 11.1 (L) 10/15/2017   HCT 32.4 (L) 10/15/2017   MCV 99.4 (H) 10/15/2017   PLT 115 (L) 10/15/2017   Lab Results  Component Value Date   FERRITIN 2,122 (H) 07/27/2017   IRON 213 (H) 07/27/2017   TIBC 258 07/27/2017   UIBC 45 07/27/2017   IRONPCTSAT 83 07/27/2017   Lab Results  Component Value Date   RETICCTPCT 4.9 (H) 07/06/2017   RBC 3.26 (L) 10/15/2017   No results found for: Nils Pyle, [facility] Lab Results  Component Value Date   IGA 232 01/25/2015   No results found for: Odetta Pink, SPEI   Chemistry      Component Value Date/Time   NA 139 10/15/2017 0840   NA 134 05/04/2017 0925   K 3.6 10/15/2017 0840   K 4.5 05/04/2017 0925   CL 103 10/15/2017 0840   CL 97 (L) 05/04/2017 0925   CO2 26 10/15/2017 0840   CO2 23 05/04/2017 0925   BUN 23 (H) 10/15/2017 0840   BUN 28 (H) 05/04/2017 0925   CREATININE 1.80 (H) 10/15/2017 0840   CREATININE 1.2 05/04/2017 0925   GLU 86 03/31/2016      Component Value Date/Time   CALCIUM 9.1 10/15/2017 0840   CALCIUM 9.3 05/04/2017 0925   ALKPHOS 62 10/15/2017 0840   ALKPHOS 58 05/04/2017 0925   AST 44 (H) 10/15/2017 0840   ALT 41 10/15/2017 0840   ALT 90 (H) 05/04/2017 0925   BILITOT 0.7 10/15/2017 0840      Impression and Plan: Isaac [name] is a very pleasant 62 yo caucasian gentleman with infiltrating high grade papillary urothelial  carcinoma with right nephrectomy in October 2018.   I still think that we will need to do another PET scan on him.  He does have high risk disease for recurrence.  His last PET scan in April looked fine.  We will do another one in August.  Again, we will see about making a referral to audiology.  We will see about having to come back after his PET scan is complete in August.  He still has the Port-A-Cath.  We will keep the Port-A-Cath in for right now.  [name], MD 6/6/20199:28 AM

## 2017-10-16 LAB — ERYTHROPOIETIN: ERYTHROPOIETIN: 28.6 m[IU]/mL — AB (ref 2.6–18.5)

## 2017-10-28 ENCOUNTER — Other Ambulatory Visit: Payer: Self-pay | Admitting: Internal Medicine

## 2017-12-10 ENCOUNTER — Encounter (HOSPITAL_COMMUNITY)
Admission: RE | Admit: 2017-12-10 | Discharge: 2017-12-10 | Disposition: A | Discharge status: Home / Self Care | Payer: Medicare Other | Source: Ambulatory Visit | Attending: Hematology & Oncology | Admitting: Hematology & Oncology

## 2017-12-10 DIAGNOSIS — I2510 Atherosclerotic heart disease of native coronary artery without angina pectoris: Secondary | ICD-10-CM | POA: Diagnosis not present

## 2017-12-10 DIAGNOSIS — C651 Malignant neoplasm of right renal pelvis: Secondary | ICD-10-CM

## 2017-12-10 DIAGNOSIS — I700 Atherosclerosis of aorta: Secondary | ICD-10-CM | POA: Diagnosis not present

## 2017-12-10 DIAGNOSIS — Z905 Acquired absence of kidney: Secondary | ICD-10-CM | POA: Insufficient documentation

## 2017-12-10 DIAGNOSIS — C679 Malignant neoplasm of bladder, unspecified: Secondary | ICD-10-CM | POA: Diagnosis not present

## 2017-12-10 LAB — GLUCOSE, CAPILLARY: GLUCOSE-CAPILLARY: 162 mg/dL — AB (ref 70–99)

## 2017-12-10 MED ORDER — FLUDEOXYGLUCOSE F - 18 (FDG) INJECTION
10.0300 | Freq: Once | INTRAVENOUS | Status: AC
  Administered 2017-12-10: 10.03 via INTRAVENOUS

## 2017-12-11 ENCOUNTER — Telehealth: Payer: Self-pay | Admitting: *Deleted

## 2017-12-11 NOTE — Telephone Encounter (Addendum)
Patient aware of results  ----- Message from [name], MD sent at 12/10/2017  4:09 PM EDT ----- Call - NO obvious cancer!!!  Laurey Arrow

## 2017-12-16 ENCOUNTER — Inpatient Hospital Stay: Payer: Medicare Other

## 2017-12-16 ENCOUNTER — Inpatient Hospital Stay: Payer: Medicare Other | Attending: Hematology & Oncology | Admitting: Hematology & Oncology

## 2017-12-16 DIAGNOSIS — E119 Type 2 diabetes mellitus without complications: Secondary | ICD-10-CM | POA: Insufficient documentation

## 2017-12-16 DIAGNOSIS — G629 Polyneuropathy, unspecified: Secondary | ICD-10-CM | POA: Insufficient documentation

## 2017-12-16 DIAGNOSIS — Z8553 Personal history of malignant neoplasm of renal pelvis: Secondary | ICD-10-CM

## 2017-12-16 DIAGNOSIS — C651 Malignant neoplasm of right renal pelvis: Secondary | ICD-10-CM

## 2017-12-16 DIAGNOSIS — Z9221 Personal history of antineoplastic chemotherapy: Secondary | ICD-10-CM | POA: Diagnosis not present

## 2017-12-16 DIAGNOSIS — Z79899 Other long term (current) drug therapy: Secondary | ICD-10-CM | POA: Diagnosis not present

## 2017-12-16 DIAGNOSIS — H9193 Unspecified hearing loss, bilateral: Secondary | ICD-10-CM

## 2017-12-16 DIAGNOSIS — Z7984 Long term (current) use of oral hypoglycemic drugs: Secondary | ICD-10-CM | POA: Diagnosis not present

## 2017-12-16 LAB — CMP (CANCER CENTER ONLY)
ALK PHOS: 60 U/L (ref 26–84)
ALT: 46 U/L (ref 10–47)
AST: 44 U/L — ABNORMAL HIGH (ref 11–38)
Albumin: 3.9 g/dL (ref 3.5–5.0)
Anion gap: 4 — ABNORMAL LOW (ref 5–15)
BUN: 25 mg/dL — AB (ref 7–22)
CO2: 27 mmol/L (ref 18–33)
CREATININE: 1.8 mg/dL — AB (ref 0.60–1.20)
Calcium: 9.9 mg/dL (ref 8.0–10.3)
Chloride: 103 mmol/L (ref 98–108)
Glucose, Bld: 155 mg/dL — ABNORMAL HIGH (ref 73–118)
Potassium: 4.1 mmol/L (ref 3.3–4.7)
Sodium: 134 mmol/L (ref 128–145)
TOTAL PROTEIN: 6.7 g/dL (ref 6.4–8.1)
Total Bilirubin: 0.8 mg/dL (ref 0.2–1.6)

## 2017-12-16 LAB — CBC WITH DIFFERENTIAL (CANCER CENTER ONLY)
Basophils Absolute: 0 10*3/uL (ref 0.0–0.1)
Basophils Relative: 1 %
EOS PCT: 4 %
Eosinophils Absolute: 0.2 10*3/uL (ref 0.0–0.5)
HEMATOCRIT: 37.5 % — AB (ref 38.7–49.9)
Hemoglobin: 12.9 g/dL — ABNORMAL LOW (ref 13.0–17.1)
LYMPHS PCT: 33 %
Lymphs Abs: 1.3 10*3/uL (ref 0.9–3.3)
MCH: 33 pg (ref 28.0–33.4)
MCHC: 34.4 g/dL (ref 32.0–35.9)
MCV: 95.9 fL (ref 82.0–98.0)
MONO ABS: 0.5 10*3/uL (ref 0.1–0.9)
Monocytes Relative: 12 %
NEUTROS ABS: 1.9 10*3/uL (ref 1.5–6.5)
Neutrophils Relative %: 50 %
PLATELETS: 112 10*3/uL — AB (ref 145–400)
RBC: 3.91 MIL/uL — ABNORMAL LOW (ref 4.20–5.70)
RDW: 12.6 % (ref 11.1–15.7)
WBC: 3.9 10*3/uL — AB (ref 4.0–10.0)

## 2017-12-16 LAB — LACTATE DEHYDROGENASE: LDH: 183 U/L (ref 98–192)

## 2017-12-16 MED ORDER — ALPRAZOLAM 0.5 MG PO TABS: 0.5000 mg | ORAL_TABLET | Freq: Three times a day (TID) | ORAL | 1 refills | Status: DC | PRN

## 2017-12-16 MED ORDER — DULOXETINE HCL 60 MG PO CPEP: 60.0000 mg | ORAL_CAPSULE | Freq: Every day | ORAL | 3 refills | Status: DC

## 2017-12-16 MED FILL — LIDOCAINE-PRILOCAINE CREAM: 2.5-2.5 | 30 days supply | Qty: 30 | Fill #0

## 2017-12-16 MED FILL — DULoxetine HCL 60 MG CPEP: 60 | 30 days supply | Qty: 30 | Fill #0

## 2017-12-16 MED FILL — ALPRAZolam 0.5 MG TABS: 0.5 | 20 days supply | Qty: 60 | Fill #0

## 2017-12-16 NOTE — Progress Notes (Signed)
Hematology and Oncology Follow Up Visit  Isaac [name] [id number] [birth year] 62 y.o. 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  He is doing okay.  He still has problems with neuropathy.  With the neuropathy, we tried him on Lyrica.  He really cannot tolerate Lyrica.  We will not try him on Cymbalta.  I think the neuropathy is a combination of his chemotherapy and diabetes.  Hopefully, the Cymbalta (60 mg p.o. daily) will help.  We did go ahead and do a PET scan on him.  The PET scan was done on August 1.  The PET scan did not show any evidence of recurrent/metastatic disease.  He still has some hearing issues.  He thought this might be getting better.  He really does not want to have a hearing aid as they are very expensive.  He is still dealing with his diabetes.  He is trying to watch what he eats.  He is not having any pain.  There is no issues with bleeding.  He has had no change in bowel or bladder habits.  He is had no leg swelling.  He has had no fever.  Overall, his performance status is ECOG 1.  Medications:  Allergies as of 12/16/2017      Reactions   Dilaudid [hydromorphone] Nausea And Vomiting   Hydrocodone Itching, Nausea And Vomiting      Medication List        Accurate as of 12/16/17  1:03 PM. Always use your most recent med list.          ALPRAZolam 0.5 MG tablet Commonly known as:  XANAX Take 1 tablet (0.5 mg total) by mouth 3 (three) times daily as needed for anxiety.   amLODipine 10 MG tablet Commonly known as:  NORVASC Take 10 mg by mouth daily.   amoxicillin 500 MG tablet Commonly known as:  AMOXIL Take 4 capsules 1 hour prior to dental work   baclofen 10 MG  tablet Commonly known as:  LIORESAL Take 0.5 tablets (5 mg total) by mouth every 12 (twelve) hours as needed for muscle spasms.   dexamethasone 4 MG tablet Commonly known as:  DECADRON Take 2 tablets by mouth once a day on the day after cisplatin chemotherapy and then take 2 tablets two times a day for 2 days. Take with food.   docusate sodium 100 MG capsule Commonly known as:  COLACE Take 1 capsule (100 mg total) by mouth 2 (two) times daily as needed (take to keep stool soft.).   dronabinol 5 MG capsule Commonly known as:  MARINOL Take 5 mg by mouth daily.   glimepiride 2 MG tablet Commonly known as:  AMARYL TAKE 1 BY MOUTH DAILY BEFORE SUPPER   INSULIN GLARGINE [place] Inject 30 Units into the skin.   isosorbide mononitrate 120 MG 24 hr tablet Commonly known as:  IMDUR TAKE 1 TABLET BY MOUTH DAILY. GENERIC EQUIVALENT FOR IMDUR   lidocaine-prilocaine cream Commonly known as:  EMLA Apply to affected area once   LORazepam 0.5 MG tablet Commonly known as:  ATIVAN Take 1 tablet (0.5 mg total) by mouth every 6 (six) hours as needed (Nausea or vomiting).   megestrol 400 MG/10ML suspension Commonly known as:  MEGACE Take 10 mLs (400 mg total)  by mouth 2 (two) times daily.   metFORMIN 1000 MG tablet Commonly known as:  GLUCOPHAGE Take 1 tablet (1,000 mg total) by mouth 2 (two) times daily with a meal.   metoprolol succinate 25 MG 24 hr tablet Commonly known as:  TOPROL-XL Take 1 tablet (25 mg total) at bedtime by mouth.   nitroGLYCERIN 0.4 MG SL tablet Commonly known as:  NITROSTAT Place 1 tablet (0.4 mg total) under the tongue every 5 (five) minutes as needed for chest pain.   OLANZapine 10 MG tablet Commonly known as:  ZYPREXA Take 1 tablet (10 mg total) by mouth at bedtime.   ondansetron 8 MG tablet Commonly known as:  ZOFRAN Take 1 tablet (8 mg total) 2 (two) times daily as needed by mouth. Start on the third day after cisplatin chemotherapy.   pantoprazole 40 MG  tablet Commonly known as:  PROTONIX TAKE 1 TABLET BY MOUTH EVERY MORNING   Pen Needles 32G X 4 MM Misc by Does not apply route.   BD PEN NEEDLE NANO U/F 32G X 4 MM Misc Generic drug:  Insulin Pen Needle U ONCE D   pregabalin 75 MG capsule Commonly known as:  LYRICA Take 1 capsule (75 mg total) by mouth 2 (two) times daily.   prochlorperazine 10 MG tablet Commonly known as:  COMPAZINE Take 1 tablet (10 mg total) by mouth every 6 (six) hours as needed (Nausea or vomiting).   rOPINIRole 2 MG tablet Commonly known as:  REQUIP TAKE 1 TABLET BY MOUTH AT BEDTIME   rosuvastatin 10 MG tablet Commonly known as:  CRESTOR TAKE 1 TAB (10 MG) BY MOUTH EVERY OTHER DAY       Allergies:  Allergies  Allergen Reactions  . Dilaudid [Hydromorphone] Nausea And Vomiting  . Hydrocodone Itching and Nausea And Vomiting    Past Medical History, Surgical history, Social history, and Family History were reviewed and updated.  Review of Systems: Review of Systems  Constitutional: Negative.   HENT: Positive for tinnitus.   Eyes: Negative.   Respiratory: Negative.   Cardiovascular: Negative.   Gastrointestinal: Positive for nausea.  Genitourinary: Negative.   Musculoskeletal: Negative.   Skin: Negative.   Neurological: Negative.   Endo/Heme/Allergies: Negative.   Psychiatric/Behavioral: Negative.      Physical Exam:  vitals were not taken for this visit.   Wt Readings from Last 3 Encounters:  12/16/17 205 lb 4 oz (93.1 kg)  10/15/17 200 lb (90.7 kg)  10/15/17 200 lb (90.7 kg)    Physical Exam  Constitutional: He is oriented to person, place, and time.  HENT:  Head: Normocephalic and atraumatic.  Mouth/Throat: Oropharynx is clear and moist.  Eyes: Pupils are equal, round, and reactive to light. EOM are normal.  Neck: Normal range of motion.  Cardiovascular: Normal rate, regular rhythm and normal heart sounds.  Pulmonary/Chest: Effort normal and breath sounds normal.   Abdominal: Soft. Bowel sounds are normal.  Musculoskeletal: Normal range of motion. He exhibits no edema, tenderness or deformity.  Lymphadenopathy:    He has no cervical adenopathy.  Neurological: He is alert and oriented to person, place, and time.  Skin: Skin is warm and dry. No rash noted. No erythema.  Psychiatric: He has a normal mood and affect. His behavior is normal. Judgment and thought content normal.  Vitals reviewed.   Lab Results  Component Value Date   WBC 3.9 (L) 12/16/2017   HGB 12.9 (L) 12/16/2017   HCT 37.5 (L) 12/16/2017   MCV 95.9  12/16/2017   PLT 112 (L) 12/16/2017   Lab Results  Component Value Date   FERRITIN 270 10/15/2017   IRON 71 10/15/2017   TIBC 299 10/15/2017   UIBC 227 10/15/2017   IRONPCTSAT 24 (L) 10/15/2017   Lab Results  Component Value Date   RETICCTPCT 2.2 (H) 10/15/2017   RBC 3.91 (L) 12/16/2017   No results found for: Nils Pyle, [facility] Lab Results  Component Value Date   IGA 232 01/25/2015   No results found for: Odetta Pink, SPE[facility]   Chemistry      Component Value Date/Time   NA 134 12/16/2017 1200   NA 134 05/04/2017 0925   K 4.1 12/16/2017 1200   K 4.5 05/04/2017 0925   CL 103 12/16/2017 1200   CL 97 (L) 05/04/2017 0925   CO2 27 12/16/2017 1200   CO2 23 05/04/2017 0925   BUN 25 (H) 12/16/2017 1200   BUN 28 (H) 05/04/2017 0925   CREATININE 1.80 (H) 12/16/2017 1200   CREATININE 1.2 05/04/2017 0925   GLU 86 03/31/2016      Component Value Date/Time   CALCIUM 9.9 12/16/2017 1200   CALCIUM 9.3 05/04/2017 0925   ALKPHOS 60 12/16/2017 1200   ALKPHOS 58 05/04/2017 0925   AST 44 (H) 12/16/2017 1200   ALT 46 12/16/2017 1200   ALT 90 (H) 05/04/2017 0925   BILITOT 0.8 12/16/2017 1200      Impression and Plan: Isaac [name] is a very pleasant 62 yo caucasian gentleman with infiltrating high grade papillary urothelial carcinoma with right nephrectomy  in October 2018.   At this point, we are in a surveillance mode.  We will send another PET scan up form in 3 months.  I think this is necessary.  He has aggressive cancer that certainly is at a higher risk for recurrence.  He still has a Port-A-Cath in.  Hopefully, we will not have to use this down the road.  I will plan to see him back after his PET scan is done in November.  [name], MD 8/7/20191:03 PM

## 2017-12-23 ENCOUNTER — Encounter: Payer: Self-pay | Admitting: Internal Medicine

## 2017-12-23 DIAGNOSIS — D225 Melanocytic nevi of trunk: Secondary | ICD-10-CM | POA: Diagnosis not present

## 2017-12-23 DIAGNOSIS — L821 Other seborrheic keratosis: Secondary | ICD-10-CM | POA: Diagnosis not present

## 2017-12-23 DIAGNOSIS — L570 Actinic keratosis: Secondary | ICD-10-CM | POA: Diagnosis not present

## 2017-12-23 DIAGNOSIS — L723 Sebaceous cyst: Secondary | ICD-10-CM | POA: Diagnosis not present

## 2017-12-23 DIAGNOSIS — Z85828 Personal history of other malignant neoplasm of skin: Secondary | ICD-10-CM | POA: Diagnosis not present

## 2017-12-24 DIAGNOSIS — E1165 Type 2 diabetes mellitus with hyperglycemia: Secondary | ICD-10-CM | POA: Diagnosis not present

## 2017-12-24 DIAGNOSIS — Z794 Long term (current) use of insulin: Secondary | ICD-10-CM | POA: Diagnosis not present

## 2018-01-05 DIAGNOSIS — E119 Type 2 diabetes mellitus without complications: Secondary | ICD-10-CM | POA: Diagnosis not present

## 2018-01-05 DIAGNOSIS — Z794 Long term (current) use of insulin: Secondary | ICD-10-CM | POA: Diagnosis not present

## 2018-01-21 MED FILL — DULoxetine HCL 60 MG CPEP: 60 | 30 days supply | Qty: 30 | Fill #1

## 2018-01-27 ENCOUNTER — Other Ambulatory Visit: Payer: Self-pay

## 2018-01-27 ENCOUNTER — Inpatient Hospital Stay: Payer: Medicare Other | Attending: Hematology & Oncology

## 2018-01-27 VITALS — BP 130/76 | HR 98 | Temp 98.3°F | Resp 18

## 2018-01-27 DIAGNOSIS — Z8553 Personal history of malignant neoplasm of renal pelvis: Secondary | ICD-10-CM | POA: Diagnosis not present

## 2018-01-27 DIAGNOSIS — C651 Malignant neoplasm of right renal pelvis: Secondary | ICD-10-CM

## 2018-01-27 DIAGNOSIS — Z452 Encounter for adjustment and management of vascular access device: Secondary | ICD-10-CM | POA: Diagnosis not present

## 2018-01-27 MED ORDER — SODIUM CHLORIDE 0.9% FLUSH
10.0000 mL | INTRAVENOUS | Status: DC | PRN
  Administered 2018-01-27: 10 mL via INTRAVENOUS
  Filled 2018-01-27: qty 10

## 2018-01-27 MED ORDER — HEPARIN SOD (PORK) LOCK FLUSH 100 UNIT/ML IV SOLN
500.0000 [IU] | Freq: Once | INTRAVENOUS | Status: AC
  Administered 2018-01-27: 500 [IU] via INTRAVENOUS
  Filled 2018-01-27: qty 5

## 2018-01-27 NOTE — Patient Instructions (Signed)
Implanted Port Home Guide An implanted port is a type of central line that is placed under the skin. Central lines are used to provide IV access when treatment or nutrition needs to be given through a person's veins. Implanted ports are used for long-term IV access. An implanted port may be placed because:  You need IV medicine that would be irritating to the small veins in your hands or arms.  You need long-term IV medicines, such as antibiotics.  You need IV nutrition for a long period.  You need frequent blood draws for lab tests.  You need dialysis.  Implanted ports are usually placed in the chest area, but they can also be placed in the upper arm, the abdomen, or the leg. An implanted port has two main parts:  Reservoir. The reservoir is round and will appear as a small, raised area under your skin. The reservoir is the part where a needle is inserted to give medicines or draw blood.  Catheter. The catheter is a thin, flexible tube that extends from the reservoir. The catheter is placed into a large vein. Medicine that is inserted into the reservoir goes into the catheter and then into the vein.  How will I care for my incision site? Do not get the incision site wet. Bathe or shower as directed by your health care provider. How is my port accessed? Special steps must be taken to access the port:  Before the port is accessed, a numbing cream can be placed on the skin. This helps numb the skin over the port site.  Your health care provider uses a sterile technique to access the port. ? Your health care provider must put on a mask and sterile gloves. ? The skin over your port is cleaned carefully with an antiseptic and allowed to dry. ? The port is gently pinched between sterile gloves, and a needle is inserted into the port.  Only "non-coring" port needles should be used to access the port. Once the port is accessed, a blood return should be checked. This helps ensure that the port  is in the vein and is not clogged.  If your port needs to remain accessed for a constant infusion, a clear (transparent) bandage will be placed over the needle site. The bandage and needle will need to be changed every week, or as directed by your health care provider.  Keep the bandage covering the needle clean and dry. Do not get it wet. Follow your health care provider's instructions on how to take a shower or bath while the port is accessed.  If your port does not need to stay accessed, no bandage is needed over the port.  What is flushing? Flushing helps keep the port from getting clogged. Follow your health care provider's instructions on how and when to flush the port. Ports are usually flushed with saline solution or a medicine called heparin. The need for flushing will depend on how the port is used.  If the port is used for intermittent medicines or blood draws, the port will need to be flushed: ? After medicines have been given. ? After blood has been drawn. ? As part of routine maintenance.  If a constant infusion is running, the port may not need to be flushed.  How long will my port stay implanted? The port can stay in for as long as your health care provider thinks it is needed. When it is time for the port to come out, surgery will be   done to remove it. The procedure is similar to the one performed when the port was put in. When should I seek immediate medical care? When you have an implanted port, you should seek immediate medical care if:  You notice a bad smell coming from the incision site.  You have swelling, redness, or drainage at the incision site.  You have more swelling or pain at the port site or the surrounding area.  You have a fever that is not controlled with medicine.  This information is not intended to replace advice given to you by your health care provider. Make sure you discuss any questions you have with your health care provider. Document  Released: 04/28/2005 Document Revised: 10/04/2015 Document Reviewed: 01/03/2013 Elsevier Interactive Patient Education  2017 Elsevier Inc.  

## 2018-02-04 ENCOUNTER — Telehealth: Payer: Self-pay | Admitting: Emergency Medicine

## 2018-02-04 DIAGNOSIS — G2581 Restless legs syndrome: Secondary | ICD-10-CM

## 2018-02-04 MED ORDER — ROPINIROLE HCL 2 MG PO TABS: 2.0000 mg | ORAL_TABLET | Freq: Every day | ORAL | 3 refills | Status: DC

## 2018-02-04 NOTE — Telephone Encounter (Addendum)
Pt called and is requesting a refill on his rOPINIRole (REQUIP) 2 MG tablet. Pharmacy is [facility]. Thanks.  Sent to [facility] prn one year

## 2018-02-26 MED FILL — DULoxetine HCL 60 MG CPEP: 60 | 30 days supply | Qty: 30 | Fill #2

## 2018-02-26 MED FILL — AMOXICILLIN 500 MG CAPSULE: 500 | 1 days supply | Qty: 4 | Fill #1

## 2018-03-03 ENCOUNTER — Other Ambulatory Visit: Payer: Self-pay | Admitting: Family

## 2018-03-03 ENCOUNTER — Encounter: Payer: Self-pay | Admitting: Gastroenterology

## 2018-03-03 DIAGNOSIS — C641 Malignant neoplasm of right kidney, except renal pelvis: Secondary | ICD-10-CM

## 2018-03-03 DIAGNOSIS — C651 Malignant neoplasm of right renal pelvis: Secondary | ICD-10-CM

## 2018-03-04 DIAGNOSIS — H04123 Dry eye syndrome of bilateral lacrimal glands: Secondary | ICD-10-CM | POA: Diagnosis not present

## 2018-03-04 DIAGNOSIS — H25013 Cortical age-related cataract, bilateral: Secondary | ICD-10-CM | POA: Diagnosis not present

## 2018-03-04 DIAGNOSIS — E119 Type 2 diabetes mellitus without complications: Secondary | ICD-10-CM | POA: Diagnosis not present

## 2018-03-04 DIAGNOSIS — H2513 Age-related nuclear cataract, bilateral: Secondary | ICD-10-CM | POA: Diagnosis not present

## 2018-03-04 LAB — HM DIABETES EYE EXAM

## 2018-03-08 DIAGNOSIS — C678 Malignant neoplasm of overlapping sites of bladder: Secondary | ICD-10-CM | POA: Diagnosis not present

## 2018-03-09 ENCOUNTER — Other Ambulatory Visit: Payer: Medicare Other | Admitting: Internal Medicine

## 2018-03-09 DIAGNOSIS — Z0000 Encounter for general adult medical examination without abnormal findings: Secondary | ICD-10-CM

## 2018-03-09 DIAGNOSIS — E7849 Other hyperlipidemia: Secondary | ICD-10-CM

## 2018-03-09 DIAGNOSIS — E1121 Type 2 diabetes mellitus with diabetic nephropathy: Secondary | ICD-10-CM | POA: Diagnosis not present

## 2018-03-09 DIAGNOSIS — I10 Essential (primary) hypertension: Secondary | ICD-10-CM | POA: Diagnosis not present

## 2018-03-10 ENCOUNTER — Other Ambulatory Visit: Payer: Self-pay | Admitting: Family

## 2018-03-10 ENCOUNTER — Telehealth: Payer: Self-pay | Admitting: Family

## 2018-03-10 ENCOUNTER — Other Ambulatory Visit: Payer: Self-pay | Admitting: Urology

## 2018-03-10 LAB — HEMOGLOBIN A1C
Hgb A1c MFr Bld: 11.4 % of total Hgb — ABNORMAL HIGH (ref <5.7)
Mean Plasma Glucose: 280 (calc)
eAG (mmol/L): 15.5 (calc)

## 2018-03-10 LAB — CBC WITH DIFFERENTIAL/PLATELET
BASOS PCT: 0.8 %
Basophils Absolute: 29 cells/uL (ref 0–200)
EOS ABS: 101 {cells}/uL (ref 15–500)
EOS PCT: 2.8 %
HCT: 41.6 % (ref 38.5–50.0)
Hemoglobin: 14 g/dL (ref 13.2–17.1)
LYMPHS ABS: 1577 {cells}/uL (ref 850–3900)
MCH: 32.6 pg (ref 27.0–33.0)
MCHC: 33.7 g/dL (ref 32.0–36.0)
MCV: 97 fL (ref 80.0–100.0)
MONOS PCT: 8.3 %
MPV: 9.2 fL (ref 7.5–12.5)
NEUTROS ABS: 1595 {cells}/uL (ref 1500–7800)
Neutrophils Relative %: 44.3 %
Platelets: 118 10*3/uL — ABNORMAL LOW (ref 140–400)
RBC: 4.29 10*6/uL (ref 4.20–5.80)
RDW: 13.1 % (ref 11.0–15.0)
Total Lymphocyte: 43.8 %
WBC mixed population: 299 cells/uL (ref 200–950)
WBC: 3.6 10*3/uL — ABNORMAL LOW (ref 3.8–10.8)

## 2018-03-10 LAB — COMPLETE METABOLIC PANEL WITH GFR
AG Ratio: 1.5 (calc) (ref 1.0–2.5)
ALBUMIN MSPROF: 4.2 g/dL (ref 3.6–5.1)
ALKALINE PHOSPHATASE (APISO): 61 U/L (ref 40–115)
ALT: 54 U/L — AB (ref 9–46)
AST: 36 U/L — AB (ref 10–35)
BILIRUBIN TOTAL: 0.6 mg/dL (ref 0.2–1.2)
BUN / CREAT RATIO: 13 (calc) (ref 6–22)
BUN: 25 mg/dL (ref 7–25)
CHLORIDE: 103 mmol/L (ref 98–110)
CO2: 27 mmol/L (ref 20–32)
CREATININE: 1.9 mg/dL — AB (ref 0.70–1.25)
Calcium: 9.6 mg/dL (ref 8.6–10.3)
GFR, Est African American: 43 mL/min/{1.73_m2} — ABNORMAL LOW (ref >=60)
GFR, Est Non African American: 37 mL/min/{1.73_m2} — ABNORMAL LOW (ref >=60)
GLUCOSE: 210 mg/dL — AB (ref 65–99)
Globulin: 2.8 g/dL (calc) (ref 1.9–3.7)
Potassium: 4.3 mmol/L (ref 3.5–5.3)
Sodium: 140 mmol/L (ref 135–146)
Total Protein: 7 g/dL (ref 6.1–8.1)

## 2018-03-10 LAB — LIPID PANEL
CHOL/HDL RATIO: 3.7 (calc) (ref <5.0)
CHOLESTEROL: 121 mg/dL (ref <200)
HDL: 33 mg/dL — ABNORMAL LOW (ref >40)
LDL CHOLESTEROL (CALC): 64 mg/dL
Non-HDL Cholesterol (Calc): 88 mg/dL (calc) (ref <130)
Triglycerides: 161 mg/dL — ABNORMAL HIGH (ref <150)

## 2018-03-10 LAB — MICROALBUMIN / CREATININE URINE RATIO
Creatinine, Urine: 82 mg/dL (ref 20–320)
MICROALB UR: 5.6 mg/dL
Microalb Creat Ratio: 68 mcg/mg creat — ABNORMAL HIGH (ref <30)

## 2018-03-10 LAB — PSA: PSA: 0.6 ng/mL (ref <=4.0)

## 2018-03-10 NOTE — Telephone Encounter (Signed)
[name] called to let us know that on recent scan with urology her was fount to have 2 bladder tumors. He is scheduled for surgery with Dr. Louis Meckel on November 14th to have these tumors removed. I let Dr. [name] know and we will proceed with Pet scan on 11/6 and MD follow-up on 11/13.

## 2018-03-12 ENCOUNTER — Ambulatory Visit (INDEPENDENT_AMBULATORY_CARE_PROVIDER_SITE_OTHER): Payer: Medicare Other | Admitting: Internal Medicine

## 2018-03-12 ENCOUNTER — Encounter: Payer: Self-pay | Admitting: Internal Medicine

## 2018-03-12 VITALS — BP 120/88 | HR 88 | Temp 98.2°F | Ht 69.0 in | Wt 205.0 lb

## 2018-03-12 DIAGNOSIS — E1142 Type 2 diabetes mellitus with diabetic polyneuropathy: Secondary | ICD-10-CM

## 2018-03-12 DIAGNOSIS — IMO0001 Reserved for inherently not codable concepts without codable children: Secondary | ICD-10-CM

## 2018-03-12 DIAGNOSIS — Z23 Encounter for immunization: Secondary | ICD-10-CM | POA: Diagnosis not present

## 2018-03-12 DIAGNOSIS — Z86010 Personal history of colonic polyps: Secondary | ICD-10-CM

## 2018-03-12 DIAGNOSIS — Z96652 Presence of left artificial knee joint: Secondary | ICD-10-CM

## 2018-03-12 DIAGNOSIS — E7849 Other hyperlipidemia: Secondary | ICD-10-CM

## 2018-03-12 DIAGNOSIS — I252 Old myocardial infarction: Secondary | ICD-10-CM

## 2018-03-12 DIAGNOSIS — F329 Major depressive disorder, single episode, unspecified: Secondary | ICD-10-CM

## 2018-03-12 DIAGNOSIS — H903 Sensorineural hearing loss, bilateral: Secondary | ICD-10-CM

## 2018-03-12 DIAGNOSIS — Z0000 Encounter for general adult medical examination without abnormal findings: Secondary | ICD-10-CM | POA: Diagnosis not present

## 2018-03-12 DIAGNOSIS — R7989 Other specified abnormal findings of blood chemistry: Secondary | ICD-10-CM

## 2018-03-12 DIAGNOSIS — C679 Malignant neoplasm of bladder, unspecified: Secondary | ICD-10-CM

## 2018-03-12 DIAGNOSIS — G4733 Obstructive sleep apnea (adult) (pediatric): Secondary | ICD-10-CM

## 2018-03-12 DIAGNOSIS — Z1211 Encounter for screening for malignant neoplasm of colon: Secondary | ICD-10-CM | POA: Diagnosis not present

## 2018-03-12 DIAGNOSIS — F32A Depression, unspecified: Secondary | ICD-10-CM

## 2018-03-12 DIAGNOSIS — Z96651 Presence of right artificial knee joint: Secondary | ICD-10-CM

## 2018-03-12 DIAGNOSIS — Z85828 Personal history of other malignant neoplasm of skin: Secondary | ICD-10-CM

## 2018-03-12 DIAGNOSIS — E119 Type 2 diabetes mellitus without complications: Secondary | ICD-10-CM

## 2018-03-12 DIAGNOSIS — Z794 Long term (current) use of insulin: Secondary | ICD-10-CM

## 2018-03-12 DIAGNOSIS — G2581 Restless legs syndrome: Secondary | ICD-10-CM

## 2018-03-12 DIAGNOSIS — F419 Anxiety disorder, unspecified: Secondary | ICD-10-CM

## 2018-03-12 DIAGNOSIS — E1165 Type 2 diabetes mellitus with hyperglycemia: Secondary | ICD-10-CM | POA: Diagnosis not present

## 2018-03-12 DIAGNOSIS — I10 Essential (primary) hypertension: Secondary | ICD-10-CM

## 2018-03-12 LAB — POCT URINALYSIS DIPSTICK
Bilirubin, UA: NEGATIVE
Glucose, UA: POSITIVE — AB
Ketones, UA: NEGATIVE
LEUKOCYTES UA: NEGATIVE
NITRITE UA: NEGATIVE
PH UA: 6 (ref 5.0–8.0)
PROTEIN UA: POSITIVE — AB
SPEC GRAV UA: 1.01 (ref 1.010–1.025)
Urobilinogen, UA: 0.2 E.U./dL

## 2018-03-12 LAB — HEMOCCULT GUIAC POC 1CARD (OFFICE): Fecal Occult Blood, POC: NEGATIVE

## 2018-03-12 NOTE — Progress Notes (Signed)
Subjective:    Patient ID: Isaac [name], male    DOB: [birth year], 62 y.o.   MRN: [id number]  HPI 62 year old Male for health maintenance exam and evaluation of medical issues. Has to have more bladder surgery for recurrent bladder tumors.  This has been scheduled for November 14th. He needs medical clearance for surgery.  Has not seen Cardiologist recently. EKG is WNL. No chest pain or SOB.  He has long-standing history of type 2 diabetes mellitus and hyperlipidemia.  He has an endocrinologist, Dr. Steffanie Dunn, in the [place] area.  History of obesity and GE reflux.  Has obstructive sleep apnea and essential hypertension.  History of restless leg syndrome.  In January 2017 he had left knee total arthroplasty.  In November 2017 right knee total arthroplasty.  Has generalized fatigue.  Dr. Herbert Deaner is his ophthalmologist.  History of fatty liver on ultrasound done by Dr. [name] in 2016.  History of mild elevation of SGOT and SGPT.  He had umbilical hernia repair in 2004, right elbow arthroscopic with open reconstruction 2005, right rotator cuff repair 2008, right knee arthroscopic surgery with debridement 2005, multiple cystoscopies and resection of bladder tumors.  Right distal ureterectomy with reimplantation 2010.  Rhinoplasty with modified cartilage graft 2009.  Appendectomy 2006.  History of left lower lobe pneumonia 1995.  Has had Pneumovax and Prevnar vaccines.  No known drug allergies  Had colonoscopy in 2007 by Dr. [name] and repeat study done in 2013.  History of adenomatous colon polyps.  Only hyperplastic polyps were found in 2013 with 5-year follow-up recommended.  He has not had that procedure done as of yet.  He is followed by Dr. [name].  Has stage III T3NxM0 infiltrating high-grade papillary urothelial carcinoma of the right renal hilum and underwent right nephro ureterectomy October 2018.  Therapy with cisplatin and gemcitabine completed March 2019  He has  peripheral neuropathy apparently due to chemotherapy and diabetes.  He did not tolerate Lyrica.  Has been tried on Cymbalta.  He had a PET scan in August that did not show any evidence of recurrent or metastatic disease.  Denies fever, hematuria, polyuria, polydipsia.  History of hearing loss  He receives disability benefits due to multiple medical issues.  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  Had heart catheterization February 2009 showing a proximal LAD of 30%, RCA with luminal irregularities, ejection fraction of 60%.  Had normal echocardiogram done September 2018 due to complaint of shortness of breath.  History of anxiety and depression.  No known drug allergies.  Social history: He is not married.  No children.  He formerly smoked for many years but quit.  He formally worked as an Oncologist.  Occasional alcohol consumption.  Family history: Father died of an accident at age 38.  Father with history of CABG.  Mother died at age 53 apparently of accidental carbon monoxide poisoning when she locked herself out of her home and stayed in a running car to keep warm.  Maternal grandmother with history of diabetes.  One sister.  Maternal grandfather with history of lung cancer.  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 [name] in 2008.    Review of Systems denies chest pain, palpitations, significant shortness of breath.  Complains of numbness and dysesthesias in his feet     Objective:   Physical Exam  Constitutional: He is oriented to  person, place, and time. He appears well-developed and well-nourished. No distress.  HENT:  Head: Normocephalic and atraumatic.  Right Ear: External ear normal.  Left Ear: External ear normal.  Mouth/Throat: Oropharynx is clear and moist. No oropharyngeal exudate.  Eyes: Pupils are equal, round, and reactive to light.  Conjunctivae and EOM are normal. Right eye exhibits no discharge. Left eye exhibits no discharge.  Neck: Neck supple. No JVD present. No thyromegaly present.  Cardiovascular: Normal rate, regular rhythm and normal heart sounds.  No murmur heard. Pulmonary/Chest: Effort normal and breath sounds normal. No stridor. No respiratory distress. He has no wheezes. He has no rales.  Abdominal: Soft. Bowel sounds are normal. He exhibits no distension and no mass. There is no tenderness. There is no rebound and no guarding.  Genitourinary: Prostate normal.  Genitourinary Comments: Rectal exam without masses.  Prostate without nodules.  Stool guaiac negative  Musculoskeletal: He exhibits no edema.  Lymphadenopathy:    He has no cervical adenopathy.  Neurological: He is alert and oriented to person, place, and time. He displays normal reflexes. No cranial nerve deficit. He exhibits normal muscle tone. Coordination normal.  Decreased sensation in feet bilaterally  Skin: Skin is warm and dry. He is not diaphoretic. There is pallor.  Psychiatric: He has a normal mood and affect. His behavior is normal. Judgment and thought content normal.  Vitals reviewed.         Assessment & Plan:  Insulin-dependent diabetes mellitus stable on current regimen and followed by endocrinologist Dr. Veva Holes in [place].  Currently hemoglobin A1c is 11.4% and a year ago was 8.5%.  Needs follow-up with endocrinologist.  History of GE reflux-stable with PPI  History of adenomatous colon polyps-needs colonoscopy in the near future  History of transitional cell carcinoma of the bladder.  PSA normal.  History of skin cancer years ago  Obstructive sleep apnea  Status post bilateral knee arthroplasties  History of non-STEMI in 2005  Hyperlipidemia treated with statin-triglycerides elevated at 161 otherwise lipid panel is normal  History of restless leg syndrome on treatment  History of hearing loss  Bilateral  peripheral neuropathy both lower extremities treated with Cymbalta  History of restless leg syndrome currently on treatment  Essential hypertension-stable  Anxiety and depression-takes Cymbalta for peripheral neuropathy which may help the symptoms  Remote history of smoking  Elevated serum creatinine 1.90 and was 2.10 in April and was 1.2 in December 2018.  Needs further follow-up after surgery.  Plan: Flu vaccine given.  He will continue with current medications and he is medically cleared for surgery on November 14.  His EKG is stable and within normal limits.  He will continue follow-up with neurology and Dr. [name], Oncology.  He needs to have follow-up colonoscopy after he recovers from the surgery.  Subjective:   Patient presents for Medicare Annual/Subsequent preventive examination.  Review Past Medical/Family/Social: See above   Risk Factors  Current exercise habits: Mostly sedentary Dietary issues discussed: Low-fat low carbohydrate  Cardiac risk factors: Hyperlipidemia, history of MI, family history, remote history of smoking  Depression Screen  (Note: if answer to either of the following is "Yes", a more complete depression screening is indicated)   Over the past two weeks, have you felt down, depressed or hopeless?  Yes when I found on ahead to have surgery Over the past two weeks, have you felt little interest or pleasure in doing things? Yes-have little energy Have you lost interest or pleasure in daily life?  Eloise Levels  about upcoming surgery Do you often feel hopeless? No Do you cry easily over simple problems? No   Activities of Daily Living  In your present state of health, do you have any difficulty performing the following activities?:   Driving? No  Managing money? No  Feeding yourself? No  Getting from bed to chair? No  Climbing a flight of stairs? No  Preparing food and eating?: No  Bathing or showering? No  Getting dressed: No  Getting to the  toilet? No  Using the toilet:No  Moving around from place to place: No  In the past year have you fallen or had a near fall?:  Yes Are you sexually active?  Yes Do you have more than one partner? No   Hearing Difficulties: No  Do you often ask people to speak up or repeat themselves?  Yes Do you experience ringing or noises in your ears?  Yes Do you have difficulty understanding soft or whispered voices?  Yes Do you feel that you have a problem with memory?  Yes do you often misplace items?  Yes   Home Safety:  Do you have a smoke alarm at your residence? Yes Do you have grab bars in the bathroom?  No Do you have throw rugs in your house?  No   Cognitive Testing  Alert? Yes Normal Appearance?Yes  Oriented to person? Yes Place? Yes  Time? Yes  Recall of three objects? Yes  Can perform simple calculations? Yes  Displays appropriate judgment?Yes  Can read the correct time from a watch face?Yes   List the Names of Other Physician/Practitioners you currently use:  See referral list for the physicians patient is currently seeing.  See above dictation   Review of Systems: See above   Objective:     General appearance: Appears stated age and mildly obese  Head: Normocephalic, without obvious abnormality, atraumatic  Eyes: conj clear, EOMi PEERLA  Ears: normal TM's and external ear canals both ears  Nose: Nares normal. Septum midline. Mucosa normal. No drainage or sinus tenderness.  Throat: lips, mucosa, and tongue normal; teeth and gums normal  Neck: no adenopathy, no carotid bruit, no JVD, supple, symmetrical, trachea midline and thyroid not enlarged, symmetric, no tenderness/mass/nodules  No CVA tenderness.  Lungs: clear to auscultation bilaterally  Breasts: normal appearance Heart: regular rate and rhythm, S1, S2 normal, no murmur, click, rub or gallop  Abdomen: soft, non-tender; bowel sounds normal; no masses, no organomegaly  Musculoskeletal: ROM normal in all joints,  no crepitus, no deformity, Normal muscle strengthen. Back  is symmetric, no curvature. Skin: Skin color, texture, turgor normal. No rashes or lesions  Lymph nodes: Cervical, supraclavicular, and axillary nodes normal.  Neurologic: CN 2 -12 Normal, Normal symmetric reflexes. Normal coordination and gait  Psych: Alert & Oriented x 3, Mood appear stable.    Assessment:    Annual wellness medicare exam   Plan:    During the course of the visit the patient was educated and counseled about appropriate screening and preventive services including:   Colonoscopy  Flu vaccine given     Patient Instructions (the written plan) was given to the patient.  Medicare Attestation  I have personally reviewed:  The patient's medical and social history  Their use of alcohol, tobacco or illicit drugs  Their current medications and supplements  The patient's functional ability including ADLs,fall risks, home safety risks, cognitive, and hearing and visual impairment  Diet and physical activities  Evidence for depression or mood  disorders  The patient's weight, height, BMI, and visual acuity have been recorded in the chart. I have made referrals, counseling, and provided education to the patient based on review of the above and I have provided the patient with a written personalized care plan for preventive services.

## 2018-03-16 ENCOUNTER — Telehealth: Payer: Self-pay

## 2018-03-16 NOTE — Telephone Encounter (Signed)
It is there to be faxed on the fax machine

## 2018-03-16 NOTE — Telephone Encounter (Signed)
[facility] is calling to check on the status of a surgery clearance for patient he is scheduled for surgery next Thursday. Call back number: [phone] Fax number: [phone]

## 2018-03-16 NOTE — Telephone Encounter (Signed)
Faxed surgical clearance over to [facility] this morning.  Received confirmation that it was received.

## 2018-03-17 ENCOUNTER — Ambulatory Visit (HOSPITAL_COMMUNITY)
Admission: RE | Admit: 2018-03-17 | Discharge: 2018-03-17 | Disposition: A | Discharge status: Home / Self Care | Payer: Medicare Other | Source: Ambulatory Visit | Attending: Family | Admitting: Family

## 2018-03-17 DIAGNOSIS — C651 Malignant neoplasm of right renal pelvis: Secondary | ICD-10-CM | POA: Diagnosis not present

## 2018-03-17 DIAGNOSIS — K5730 Diverticulosis of large intestine without perforation or abscess without bleeding: Secondary | ICD-10-CM | POA: Diagnosis not present

## 2018-03-17 DIAGNOSIS — C641 Malignant neoplasm of right kidney, except renal pelvis: Secondary | ICD-10-CM | POA: Diagnosis not present

## 2018-03-17 LAB — GLUCOSE, CAPILLARY: GLUCOSE-CAPILLARY: 186 mg/dL — AB (ref 70–99)

## 2018-03-17 MED ORDER — FLUDEOXYGLUCOSE F - 18 (FDG) INJECTION
10.3000 | Freq: Once | INTRAVENOUS | Status: AC | PRN
  Administered 2018-03-17: 10.3 via INTRAVENOUS

## 2018-03-18 ENCOUNTER — Other Ambulatory Visit: Payer: Self-pay

## 2018-03-18 ENCOUNTER — Encounter (HOSPITAL_BASED_OUTPATIENT_CLINIC_OR_DEPARTMENT_OTHER): Payer: Self-pay | Admitting: *Deleted

## 2018-03-18 NOTE — Progress Notes (Addendum)
Spoke w/ pt via phone for pre-op interview.  Npo after mn w/ exception clear liquids until 0600 (no cream/milk products).  Arrive at 1000.  Current lab results , dated 03-09-2018, in chart and epic.  Current ekg in chart and epic.  Will take imdur, metoprolol, and protonix am dos w/ sips of water. Pt verbalized understanding to do half dose of glargine insulin night before surgery.

## 2018-03-21 DIAGNOSIS — R7989 Other specified abnormal findings of blood chemistry: Secondary | ICD-10-CM | POA: Insufficient documentation

## 2018-03-21 NOTE — Patient Instructions (Signed)
Good luck with upcoming surgery.  It was a pleasure to see you today.  Continue same medications.  He will need follow-up of elevated serum creatinine a few days after surgery.  Needs to take better care of diabetes mellitus and needs follow-up with Dr. Veva Holes.  Needs repeat colonoscopy in the near future.

## 2018-03-24 ENCOUNTER — Inpatient Hospital Stay: Payer: Medicare Other

## 2018-03-24 ENCOUNTER — Inpatient Hospital Stay: Payer: Medicare Other | Attending: Hematology & Oncology | Admitting: Hematology & Oncology

## 2018-03-24 ENCOUNTER — Encounter: Payer: Self-pay | Admitting: Hematology & Oncology

## 2018-03-24 ENCOUNTER — Other Ambulatory Visit: Payer: Self-pay

## 2018-03-24 VITALS — Wt 204.0 lb

## 2018-03-24 VITALS — BP 144/88 | HR 98 | Temp 97.9°F | Resp 18

## 2018-03-24 DIAGNOSIS — Z8553 Personal history of malignant neoplasm of renal pelvis: Secondary | ICD-10-CM

## 2018-03-24 DIAGNOSIS — Z9221 Personal history of antineoplastic chemotherapy: Secondary | ICD-10-CM | POA: Insufficient documentation

## 2018-03-24 DIAGNOSIS — Z79899 Other long term (current) drug therapy: Secondary | ICD-10-CM | POA: Insufficient documentation

## 2018-03-24 DIAGNOSIS — E1140 Type 2 diabetes mellitus with diabetic neuropathy, unspecified: Secondary | ICD-10-CM | POA: Insufficient documentation

## 2018-03-24 DIAGNOSIS — D508 Other iron deficiency anemias: Secondary | ICD-10-CM

## 2018-03-24 DIAGNOSIS — Z794 Long term (current) use of insulin: Secondary | ICD-10-CM | POA: Diagnosis not present

## 2018-03-24 DIAGNOSIS — Z95828 Presence of other vascular implants and grafts: Secondary | ICD-10-CM

## 2018-03-24 DIAGNOSIS — Z7982 Long term (current) use of aspirin: Secondary | ICD-10-CM | POA: Diagnosis not present

## 2018-03-24 DIAGNOSIS — C651 Malignant neoplasm of right renal pelvis: Secondary | ICD-10-CM

## 2018-03-24 LAB — CBC WITH DIFFERENTIAL (CANCER CENTER ONLY)
Abs Immature Granulocytes: 0.02 10*3/uL (ref 0.00–0.07)
BASOS PCT: 0 %
Basophils Absolute: 0 10*3/uL (ref 0.0–0.1)
EOS ABS: 0.1 10*3/uL (ref 0.0–0.5)
EOS PCT: 2 %
HCT: 39.2 % (ref 39.0–52.0)
Hemoglobin: 13.2 g/dL (ref 13.0–17.0)
Immature Granulocytes: 1 %
LYMPHS PCT: 35 %
Lymphs Abs: 1.1 10*3/uL (ref 0.7–4.0)
MCH: 32.9 pg (ref 26.0–34.0)
MCHC: 33.7 g/dL (ref 30.0–36.0)
MCV: 97.8 fL (ref 80.0–100.0)
Monocytes Absolute: 0.3 10*3/uL (ref 0.1–1.0)
Monocytes Relative: 10 %
NEUTROS ABS: 1.6 10*3/uL — AB (ref 1.7–7.7)
NEUTROS PCT: 52 %
NRBC: 0 % (ref 0.0–0.2)
PLATELETS: 103 10*3/uL — AB (ref 150–400)
RBC: 4.01 MIL/uL — ABNORMAL LOW (ref 4.22–5.81)
RDW: 13 % (ref 11.5–15.5)
WBC Count: 3.1 10*3/uL — ABNORMAL LOW (ref 4.0–10.5)

## 2018-03-24 LAB — CMP (CANCER CENTER ONLY)
ALK PHOS: 53 U/L (ref 26–84)
ALT: 101 U/L — ABNORMAL HIGH (ref 10–47)
AST: 129 U/L — ABNORMAL HIGH (ref 11–38)
Albumin: 4.2 g/dL (ref 3.5–5.0)
Anion gap: 12 (ref 5–15)
BUN: 16 mg/dL (ref 7–22)
CHLORIDE: 105 mmol/L (ref 98–108)
CO2: 24 mmol/L (ref 18–33)
Calcium: 9.1 mg/dL (ref 8.0–10.3)
Creatinine: 1.7 mg/dL — ABNORMAL HIGH (ref 0.60–1.20)
GLUCOSE: 152 mg/dL — AB (ref 73–118)
POTASSIUM: 3.8 mmol/L (ref 3.3–4.7)
SODIUM: 141 mmol/L (ref 128–145)
Total Bilirubin: 0.8 mg/dL (ref 0.2–1.6)
Total Protein: 6.9 g/dL (ref 6.4–8.1)

## 2018-03-24 MED ORDER — HEPARIN SOD (PORK) LOCK FLUSH 100 UNIT/ML IV SOLN
500.0000 [IU] | Freq: Once | INTRAVENOUS | Status: AC
  Administered 2018-03-24: 500 [IU] via INTRAVENOUS
  Filled 2018-03-24: qty 5

## 2018-03-24 MED ORDER — VITAMIN B-6 500 MG PO TABS: 500.0000 mg | ORAL_TABLET | Freq: Every day | ORAL | 6 refills | Status: DC

## 2018-03-24 MED ORDER — AMOXICILLIN 500 MG PO TABS: ORAL_TABLET | ORAL | 0 refills | Status: DC

## 2018-03-24 MED ORDER — SODIUM CHLORIDE 0.9% FLUSH
10.0000 mL | INTRAVENOUS | Status: DC | PRN
  Administered 2018-03-24: 10 mL via INTRAVENOUS
  Filled 2018-03-24: qty 10

## 2018-03-24 MED FILL — VIT B-6 200 MG TABLET: 200 MG | 24 days supply | Qty: 60 | Fill #0

## 2018-03-24 MED FILL — AMOXICILLIN 500 MG CAPSULE: 500 | 2 days supply | Qty: 8 | Fill #0

## 2018-03-24 NOTE — Progress Notes (Signed)
Hematology and Oncology Follow Up Visit  Isaac [name] [id number] [birth year] 62 y.o. 03/24/2018   Principle Diagnosis:  Stage III (T3NxM0)Infiltrating high grade papillary urothelial carcinomaof the RIGHT renal hilum -right nephroureterectomy on 02/12/2017  Current Therapy:   Cisplatin/Gemcitabine s/pcycle #6 - completed on 07/27/2017   Interim History:  Isaac [name] is here today for follow-up.  He is doing okay.  He actually is having a cystoscopy tomorrow.  I suppose this is to check his bladder and see about resecting any superficial bladder tumors that he has.  He has a Port-A-Cath in.  I need to make sure that he is on antibiotic prophylactic.  I will send in some amoxicillin for him (2 g p.o. 1 hour prior to procedure) for him to take.  He still has neuropathy.  It is not any worse and is not any better.  He is on Cymbalta which does seem to help a little bit.  I will add some vitamin B6 (500 mg p.o. daily) to try to help.  I we did go ahead and get a PET scan on him.  This was done on March 17, 2018.  The PET scan showed no evidence of recurrent/metastatic disease.  He is still managing his diabetes.  This seems to fluctuate a little bit.  He has had no bleeding.  He has had no diarrhea.  He has had no fever.  He has had no mouth sores.  Overall, his performance status is ECOG 1.    Medications:  Allergies as of 03/24/2018      Reactions   Dilaudid [hydromorphone] Nausea And Vomiting   Hydrocodone Itching, Nausea And Vomiting      Medication List        Accurate as of 03/24/18 12:44 PM. Always use your most recent med list.          ALPRAZolam 0.5 MG tablet Commonly known as:  XANAX Take 1 tablet (0.5 mg total) by mouth 3 (three) times daily as needed for anxiety.   amLODipine 10 MG tablet Commonly known as:  NORVASC Take 10 mg by mouth every morning.   amoxicillin 500 MG tablet Commonly known as:  AMOXIL Take 4 capsules 1 hour prior to dental  work   aspirin EC 81 MG tablet Take 81 mg by mouth daily.   DULoxetine 60 MG capsule Commonly known as:  CYMBALTA Take 1 capsule (60 mg total) by mouth daily.   glimepiride 2 MG tablet Commonly known as:  AMARYL TAKE 1 BY MOUTH DAILY BEFORE SUPPER   INSULIN GLARGINE Galva Inject 40 Units into the skin at bedtime.   isosorbide mononitrate 120 MG 24 hr tablet Commonly known as:  IMDUR TAKE 1 TABLET BY MOUTH DAILY. GENERIC EQUIVALENT FOR IMDUR   lidocaine-prilocaine cream Commonly known as:  EMLA Apply to affected area once   LORazepam 0.5 MG tablet Commonly known as:  ATIVAN Take 1 tablet (0.5 mg total) by mouth every 6 (six) hours as needed (Nausea or vomiting).   metoprolol succinate 25 MG 24 hr tablet Commonly known as:  TOPROL-XL Take 1 tablet (25 mg total) at bedtime by mouth.   nitroGLYCERIN 0.4 MG SL tablet Commonly known as:  NITROSTAT Place 1 tablet (0.4 mg total) under the tongue every 5 (five) minutes as needed for chest pain.   ondansetron 8 MG tablet Commonly known as:  ZOFRAN Take 1 tablet (8 mg total) 2 (two) times daily as needed by mouth. Start on the third day after cisplatin  chemotherapy.   pantoprazole 40 MG tablet Commonly known as:  PROTONIX TAKE 1 TABLET BY MOUTH EVERY MORNING   Pen Needles 32G X 4 MM Misc by Does not apply route.   BD PEN NEEDLE NANO U/F 32G X 4 MM Misc Generic drug:  Insulin Pen Needle U ONCE D   prochlorperazine 10 MG tablet Commonly known as:  COMPAZINE Take 1 tablet (10 mg total) by mouth every 6 (six) hours as needed (Nausea or vomiting).   rOPINIRole 2 MG tablet Commonly known as:  REQUIP Take 1 tablet (2 mg total) by mouth at bedtime.   rosuvastatin 10 MG tablet Commonly known as:  CRESTOR TAKE 1 TAB (10 MG) BY MOUTH EVERY OTHER DAY       Allergies:  Allergies  Allergen Reactions  . Dilaudid [Hydromorphone] Nausea And Vomiting  . Hydrocodone Itching and Nausea And Vomiting    Past Medical History,  Surgical history, Social history, and Family History were reviewed and updated.  Review of Systems: Review of Systems  Constitutional: Negative.   HENT: Positive for tinnitus.   Eyes: Negative.   Respiratory: Negative.   Cardiovascular: Negative.   Gastrointestinal: Positive for nausea.  Genitourinary: Negative.   Musculoskeletal: Negative.   Skin: Negative.   Neurological: Negative.   Endo/Heme/Allergies: Negative.   Psychiatric/Behavioral: Negative.      Physical Exam:  weight is 204 lb (92.5 kg).   Wt Readings from Last 3 Encounters:  03/24/18 204 lb (92.5 kg)  03/12/18 205 lb (93 kg)  12/16/17 205 lb 4 oz (93.1 kg)    Physical Exam  Constitutional: He is oriented to person, place, and time.  HENT:  Head: Normocephalic and atraumatic.  Mouth/Throat: Oropharynx is clear and moist.  Eyes: Pupils are equal, round, and reactive to light. EOM are normal.  Neck: Normal range of motion.  Cardiovascular: Normal rate, regular rhythm and normal heart sounds.  Pulmonary/Chest: Effort normal and breath sounds normal.  Abdominal: Soft. Bowel sounds are normal.  Musculoskeletal: Normal range of motion. He exhibits no edema, tenderness or deformity.  Lymphadenopathy:    He has no cervical adenopathy.  Neurological: He is alert and oriented to person, place, and time.  Skin: Skin is warm and dry. No rash noted. No erythema.  Psychiatric: He has a normal mood and affect. His behavior is normal. Judgment and thought content normal.  Vitals reviewed.   Lab Results  Component Value Date   WBC 3.1 (L) 03/24/2018   HGB 13.2 03/24/2018   HCT 39.2 03/24/2018   MCV 97.8 03/24/2018   PLT 103 (L) 03/24/2018   Lab Results  Component Value Date   FERRITIN 270 10/15/2017   IRON 71 10/15/2017   TIBC 299 10/15/2017   UIBC 227 10/15/2017   IRONPCTSAT 24 (L) 10/15/2017   Lab Results  Component Value Date   RETICCTPCT 2.2 (H) 10/15/2017   RBC 4.01 (L) 03/24/2018   No results found  for: KPAFRELGTCHN, LAMBDASER, [facility] Lab Results  Component Value Date   IGA 232 01/25/2015   No results found for: Odetta Pink, SPEI   Chemistry      Component Value Date/Time   NA 140 03/09/2018 1034   NA 134 05/04/2017 0925   K 4.3 03/09/2018 1034   K 4.5 05/04/2017 0925   CL 103 03/09/2018 1034   CL 97 (L) 05/04/2017 0925   CO2 27 03/09/2018 1034   CO2 23 05/04/2017 0925   BUN 25 03/09/2018 1034  BUN 28 (H) 05/04/2017 0925   CREATININE 1.90 (H) 03/09/2018 1034   GLU 86 03/31/2016      Component Value Date/Time   CALCIUM 9.6 03/09/2018 1034   CALCIUM 9.3 05/04/2017 0925   ALKPHOS 60 12/16/2017 1200   ALKPHOS 58 05/04/2017 0925   AST 36 (H) 03/09/2018 1034   AST 44 (H) 12/16/2017 1200   ALT 54 (H) 03/09/2018 1034   ALT 46 12/16/2017 1200   ALT 90 (H) 05/04/2017 0925   BILITOT 0.6 03/09/2018 1034   BILITOT 0.8 12/16/2017 1200      Impression and Plan: Isaac [name] is a very pleasant 62 yo caucasian gentleman with infiltrating high grade papillary urothelial carcinoma with right nephrectomy in October 2018.   At this point, we are in a surveillance mode.  I will go ahead and set him up with another PET scan to be done in 3 months.  I think we need to do PET scans every 3 months for the first year or so.  I think he has a very aggressive tumor and we need to make sure we check him closely so that we can act if there is any evidence of recurrent disease.  Hopefully, his neuropathy will improve.  His Port-A-Cath will remain in.   [name], MD 11/13/201912:44 PM

## 2018-03-25 ENCOUNTER — Other Ambulatory Visit: Payer: Medicare Other | Admitting: Internal Medicine

## 2018-03-25 ENCOUNTER — Encounter (HOSPITAL_BASED_OUTPATIENT_CLINIC_OR_DEPARTMENT_OTHER)
Admission: RE | Disposition: A | Discharge status: Home / Self Care | Payer: Self-pay | Source: Ambulatory Visit | Attending: Urology

## 2018-03-25 ENCOUNTER — Ambulatory Visit (HOSPITAL_BASED_OUTPATIENT_CLINIC_OR_DEPARTMENT_OTHER): Payer: Medicare Other | Admitting: Anesthesiology

## 2018-03-25 ENCOUNTER — Ambulatory Visit (HOSPITAL_BASED_OUTPATIENT_CLINIC_OR_DEPARTMENT_OTHER)
Admission: RE | Admit: 2018-03-25 | Discharge: 2018-03-25 | Disposition: A | Discharge status: Home / Self Care | Payer: Medicare Other | Source: Ambulatory Visit | Attending: Urology | Admitting: Urology

## 2018-03-25 ENCOUNTER — Encounter (HOSPITAL_BASED_OUTPATIENT_CLINIC_OR_DEPARTMENT_OTHER): Payer: Self-pay | Admitting: Anesthesiology

## 2018-03-25 DIAGNOSIS — Z8551 Personal history of malignant neoplasm of bladder: Secondary | ICD-10-CM | POA: Diagnosis not present

## 2018-03-25 DIAGNOSIS — Z7982 Long term (current) use of aspirin: Secondary | ICD-10-CM | POA: Diagnosis not present

## 2018-03-25 DIAGNOSIS — Z792 Long term (current) use of antibiotics: Secondary | ICD-10-CM | POA: Diagnosis not present

## 2018-03-25 DIAGNOSIS — Z79899 Other long term (current) drug therapy: Secondary | ICD-10-CM | POA: Insufficient documentation

## 2018-03-25 DIAGNOSIS — K219 Gastro-esophageal reflux disease without esophagitis: Secondary | ICD-10-CM | POA: Insufficient documentation

## 2018-03-25 DIAGNOSIS — E1142 Type 2 diabetes mellitus with diabetic polyneuropathy: Secondary | ICD-10-CM | POA: Diagnosis not present

## 2018-03-25 DIAGNOSIS — C676 Malignant neoplasm of ureteric orifice: Secondary | ICD-10-CM | POA: Diagnosis not present

## 2018-03-25 DIAGNOSIS — N183 Chronic kidney disease, stage 3 (moderate): Secondary | ICD-10-CM | POA: Insufficient documentation

## 2018-03-25 DIAGNOSIS — C679 Malignant neoplasm of bladder, unspecified: Secondary | ICD-10-CM | POA: Diagnosis not present

## 2018-03-25 DIAGNOSIS — R319 Hematuria, unspecified: Secondary | ICD-10-CM | POA: Diagnosis not present

## 2018-03-25 DIAGNOSIS — I2510 Atherosclerotic heart disease of native coronary artery without angina pectoris: Secondary | ICD-10-CM | POA: Insufficient documentation

## 2018-03-25 DIAGNOSIS — I129 Hypertensive chronic kidney disease with stage 1 through stage 4 chronic kidney disease, or unspecified chronic kidney disease: Secondary | ICD-10-CM | POA: Diagnosis not present

## 2018-03-25 DIAGNOSIS — G4733 Obstructive sleep apnea (adult) (pediatric): Secondary | ICD-10-CM | POA: Diagnosis not present

## 2018-03-25 DIAGNOSIS — I10 Essential (primary) hypertension: Secondary | ICD-10-CM | POA: Insufficient documentation

## 2018-03-25 DIAGNOSIS — Z905 Acquired absence of kidney: Secondary | ICD-10-CM | POA: Insufficient documentation

## 2018-03-25 DIAGNOSIS — F329 Major depressive disorder, single episode, unspecified: Secondary | ICD-10-CM | POA: Diagnosis not present

## 2018-03-25 DIAGNOSIS — E785 Hyperlipidemia, unspecified: Secondary | ICD-10-CM | POA: Diagnosis not present

## 2018-03-25 DIAGNOSIS — E1122 Type 2 diabetes mellitus with diabetic chronic kidney disease: Secondary | ICD-10-CM | POA: Diagnosis not present

## 2018-03-25 DIAGNOSIS — F411 Generalized anxiety disorder: Secondary | ICD-10-CM | POA: Insufficient documentation

## 2018-03-25 DIAGNOSIS — C671 Malignant neoplasm of dome of bladder: Secondary | ICD-10-CM | POA: Diagnosis not present

## 2018-03-25 DIAGNOSIS — C678 Malignant neoplasm of overlapping sites of bladder: Secondary | ICD-10-CM | POA: Diagnosis not present

## 2018-03-25 DIAGNOSIS — Z794 Long term (current) use of insulin: Secondary | ICD-10-CM | POA: Diagnosis not present

## 2018-03-25 DIAGNOSIS — R109 Unspecified abdominal pain: Secondary | ICD-10-CM | POA: Diagnosis not present

## 2018-03-25 DIAGNOSIS — C674 Malignant neoplasm of posterior wall of bladder: Secondary | ICD-10-CM | POA: Diagnosis not present

## 2018-03-25 DIAGNOSIS — Z87891 Personal history of nicotine dependence: Secondary | ICD-10-CM | POA: Diagnosis not present

## 2018-03-25 DIAGNOSIS — C675 Malignant neoplasm of bladder neck: Secondary | ICD-10-CM | POA: Insufficient documentation

## 2018-03-25 DIAGNOSIS — Z8553 Personal history of malignant neoplasm of renal pelvis: Secondary | ICD-10-CM | POA: Insufficient documentation

## 2018-03-25 DIAGNOSIS — I252 Old myocardial infarction: Secondary | ICD-10-CM | POA: Diagnosis not present

## 2018-03-25 HISTORY — DX: Chronic kidney disease, stage 3 unspecified: N18.30

## 2018-03-25 HISTORY — DX: Personal history of antineoplastic chemotherapy: Z92.21

## 2018-03-25 HISTORY — DX: Generalized anxiety disorder: F41.1

## 2018-03-25 HISTORY — PX: CYSTOSCOPY W/ RETROGRADES: SHX1426

## 2018-03-25 HISTORY — PX: TRANSURETHRAL RESECTION OF BLADDER TUMOR: SHX2575

## 2018-03-25 HISTORY — DX: Presence of spectacles and contact lenses: Z97.3

## 2018-03-25 HISTORY — DX: Long term (current) use of insulin: E11.9

## 2018-03-25 HISTORY — DX: Renal agenesis, unilateral: Q60.0

## 2018-03-25 HISTORY — DX: Chronic kidney disease, stage 3 (moderate): N18.3

## 2018-03-25 HISTORY — DX: Unspecified hearing loss, unspecified ear: H91.90

## 2018-03-25 HISTORY — DX: Type 2 diabetes mellitus without complications: Z79.4

## 2018-03-25 HISTORY — DX: Reserved for concepts with insufficient information to code with codable children: IMO0002

## 2018-03-25 LAB — GLUCOSE, CAPILLARY
GLUCOSE-CAPILLARY: 151 mg/dL — AB (ref 70–99)
GLUCOSE-CAPILLARY: 183 mg/dL — AB (ref 70–99)

## 2018-03-25 LAB — LACTATE DEHYDROGENASE: LDH: 310 U/L — ABNORMAL HIGH (ref 98–192)

## 2018-03-25 SURGERY — TURBT (TRANSURETHRAL RESECTION OF BLADDER TUMOR)
Anesthesia: General | Site: Renal

## 2018-03-25 MED ORDER — PROMETHAZINE HCL 25 MG/ML IJ SOLN
6.2500 mg | INTRAMUSCULAR | Status: DC | PRN
  Filled 2018-03-25: qty 1

## 2018-03-25 MED ORDER — FENTANYL CITRATE (PF) 100 MCG/2ML IJ SOLN
25.0000 ug | INTRAMUSCULAR | Status: DC | PRN
  Administered 2018-03-25 (×2): 25 ug via INTRAVENOUS
  Administered 2018-03-25 (×2): 50 ug via INTRAVENOUS
  Filled 2018-03-25: qty 1

## 2018-03-25 MED ORDER — PROPOFOL 10 MG/ML IV BOLUS
INTRAVENOUS | Status: AC
  Filled 2018-03-25: qty 40

## 2018-03-25 MED ORDER — TRAMADOL HCL 50 MG PO TABS
100.0000 mg | ORAL_TABLET | Freq: Once | ORAL | Status: AC
  Administered 2018-03-25: 100 mg via ORAL
  Filled 2018-03-25: qty 2

## 2018-03-25 MED ORDER — PHENAZOPYRIDINE HCL 200 MG PO TABS
200.0000 mg | ORAL_TABLET | Freq: Once | ORAL | Status: AC
  Administered 2018-03-25: 200 mg via ORAL
  Filled 2018-03-25: qty 1

## 2018-03-25 MED ORDER — FENTANYL CITRATE (PF) 100 MCG/2ML IJ SOLN
INTRAMUSCULAR | Status: DC | PRN
  Administered 2018-03-25: 100 ug via INTRAVENOUS
  Administered 2018-03-25: 50 ug via INTRAVENOUS
  Administered 2018-03-25 (×2): 25 ug via INTRAVENOUS

## 2018-03-25 MED ORDER — TRAMADOL HCL 50 MG PO TABS
ORAL_TABLET | ORAL | Status: AC
  Filled 2018-03-25: qty 2

## 2018-03-25 MED ORDER — PHENAZOPYRIDINE HCL 100 MG PO TABS
ORAL_TABLET | ORAL | Status: AC
  Filled 2018-03-25: qty 2

## 2018-03-25 MED ORDER — BELLADONNA ALKALOIDS-OPIUM 16.2-60 MG RE SUPP
RECTAL | Status: AC
  Filled 2018-03-25: qty 1

## 2018-03-25 MED ORDER — BELLADONNA ALKALOIDS-OPIUM 16.2-60 MG RE SUPP
RECTAL | Status: DC | PRN
  Administered 2018-03-25: 1 via RECTAL

## 2018-03-25 MED ORDER — PROPOFOL 10 MG/ML IV BOLUS
INTRAVENOUS | Status: DC | PRN
  Administered 2018-03-25: 120 mg via INTRAVENOUS

## 2018-03-25 MED ORDER — MIDAZOLAM HCL 2 MG/2ML IJ SOLN
0.5000 mg | Freq: Once | INTRAMUSCULAR | Status: DC | PRN
  Filled 2018-03-25: qty 2

## 2018-03-25 MED ORDER — MIDAZOLAM HCL 2 MG/2ML IJ SOLN
INTRAMUSCULAR | Status: AC
  Filled 2018-03-25: qty 2

## 2018-03-25 MED ORDER — MIDAZOLAM HCL 2 MG/2ML IJ SOLN
INTRAMUSCULAR | Status: DC | PRN
  Administered 2018-03-25: 2 mg via INTRAVENOUS

## 2018-03-25 MED ORDER — EPHEDRINE SULFATE 50 MG/ML IJ SOLN
INTRAMUSCULAR | Status: DC | PRN
  Administered 2018-03-25 (×3): 10 mg via INTRAVENOUS

## 2018-03-25 MED ORDER — PHENAZOPYRIDINE HCL 200 MG PO TABS: 200.0000 mg | ORAL_TABLET | Freq: Three times a day (TID) | ORAL | 0 refills | Status: DC | PRN

## 2018-03-25 MED ORDER — FENTANYL CITRATE (PF) 100 MCG/2ML IJ SOLN
INTRAMUSCULAR | Status: AC
  Filled 2018-03-25: qty 2

## 2018-03-25 MED ORDER — CEFAZOLIN SODIUM-DEXTROSE 2-4 GM/100ML-% IV SOLN
INTRAVENOUS | Status: AC
  Filled 2018-03-25: qty 100

## 2018-03-25 MED ORDER — SODIUM CHLORIDE 0.9 % IR SOLN
Status: DC | PRN
  Administered 2018-03-25: 9000 mL

## 2018-03-25 MED ORDER — SODIUM CHLORIDE 0.9 % IV SOLN
INTRAVENOUS | Status: DC
  Administered 2018-03-25 (×2): via INTRAVENOUS
  Filled 2018-03-25: qty 1000

## 2018-03-25 MED ORDER — MEPERIDINE HCL 25 MG/ML IJ SOLN
6.2500 mg | INTRAMUSCULAR | Status: DC | PRN
  Filled 2018-03-25: qty 1

## 2018-03-25 MED ORDER — METOPROLOL TARTRATE 25 MG PO TABS
25.0000 mg | ORAL_TABLET | Freq: Once | ORAL | Status: AC
  Administered 2018-03-25: 25 mg via ORAL
  Filled 2018-03-25: qty 1

## 2018-03-25 MED ORDER — ONDANSETRON HCL 4 MG/2ML IJ SOLN
INTRAMUSCULAR | Status: DC | PRN
  Administered 2018-03-25: 4 mg via INTRAVENOUS

## 2018-03-25 MED ORDER — METOPROLOL TARTRATE 25 MG PO TABS
ORAL_TABLET | ORAL | Status: AC
  Filled 2018-03-25: qty 1

## 2018-03-25 MED ORDER — DEXAMETHASONE SODIUM PHOSPHATE 10 MG/ML IJ SOLN
INTRAMUSCULAR | Status: DC | PRN
  Administered 2018-03-25: 5 mg via INTRAVENOUS

## 2018-03-25 MED ORDER — TRAMADOL HCL 50 MG PO TABS: 50.0000 mg | ORAL_TABLET | Freq: Four times a day (QID) | ORAL | 0 refills | Status: DC | PRN

## 2018-03-25 MED ORDER — PHENYLEPHRINE HCL 10 MG/ML IJ SOLN
INTRAMUSCULAR | Status: DC | PRN
  Administered 2018-03-25: 80 ug via INTRAVENOUS
  Administered 2018-03-25: 40 ug via INTRAVENOUS
  Administered 2018-03-25 (×2): 80 ug via INTRAVENOUS
  Administered 2018-03-25: 120 ug via INTRAVENOUS

## 2018-03-25 MED ORDER — SUGAMMADEX SODIUM 200 MG/2ML IV SOLN
INTRAVENOUS | Status: DC | PRN
  Administered 2018-03-25: 200 mg via INTRAVENOUS

## 2018-03-25 MED ORDER — IOHEXOL 300 MG/ML  SOLN
INTRAMUSCULAR | Status: DC | PRN
  Administered 2018-03-25: 12 mL

## 2018-03-25 MED ORDER — LIDOCAINE 2% (20 MG/ML) 5 ML SYRINGE
INTRAMUSCULAR | Status: DC | PRN
  Administered 2018-03-25: 30 mg via INTRAVENOUS

## 2018-03-25 MED ORDER — STERILE WATER FOR IRRIGATION IR SOLN
Status: DC | PRN
  Administered 2018-03-25: 6000 mL

## 2018-03-25 MED ORDER — ROCURONIUM BROMIDE 10 MG/ML (PF) SYRINGE
PREFILLED_SYRINGE | INTRAVENOUS | Status: DC | PRN
  Administered 2018-03-25: 50 mg via INTRAVENOUS

## 2018-03-25 MED ORDER — CEFAZOLIN SODIUM-DEXTROSE 2-4 GM/100ML-% IV SOLN
2.0000 g | INTRAVENOUS | Status: AC
  Administered 2018-03-25: 2 g via INTRAVENOUS
  Filled 2018-03-25: qty 100

## 2018-03-25 MED ORDER — LIDOCAINE HCL URETHRAL/MUCOSAL 2 % EX GEL
CUTANEOUS | Status: DC | PRN
  Administered 2018-03-25: 1

## 2018-03-25 MED ORDER — EPHEDRINE 5 MG/ML INJ
INTRAVENOUS | Status: AC
  Filled 2018-03-25: qty 10

## 2018-03-25 SURGICAL SUPPLY — 34 items
BAG DRAIN URO-CYSTO SKYTR STRL (DRAIN) ×3 IMPLANT
BAG URINE DRAINAGE (UROLOGICAL SUPPLIES) ×3 IMPLANT
BASKET LASER NITINOL 1.9FR (BASKET) IMPLANT
CATH FOLEY 3WAY 30CC 22FR (CATHETERS) IMPLANT
CATH URET 5FR 28IN OPEN ENDED (CATHETERS) ×3 IMPLANT
CATH URET DUAL LUMEN 6-10FR 50 (CATHETERS) IMPLANT
CLOTH BEACON ORANGE TIMEOUT ST (SAFETY) ×3 IMPLANT
ELECT REM PT RETURN 9FT ADLT (ELECTROSURGICAL) ×3
ELECTRODE REM PT RTRN 9FT ADLT (ELECTROSURGICAL) ×2 IMPLANT
EVACUATOR MICROVAS BLADDER (UROLOGICAL SUPPLIES) IMPLANT
EXTRACTOR STONE 1.7FRX115CM (UROLOGICAL SUPPLIES) IMPLANT
FIBER LASER TRAC TIP (UROLOGICAL SUPPLIES) IMPLANT
GLOVE BIO SURGEON STRL SZ 6.5 (GLOVE) ×3 IMPLANT
GLOVE BIO SURGEON STRL SZ7.5 (GLOVE) ×3 IMPLANT
GLOVE BIOGEL PI IND STRL 6.5 (GLOVE) ×4 IMPLANT
GLOVE BIOGEL PI INDICATOR 6.5 (GLOVE) ×2
GOWN STRL REUS W/ TWL XL LVL3 (GOWN DISPOSABLE) ×4 IMPLANT
GOWN STRL REUS W/TWL LRG LVL3 (GOWN DISPOSABLE) ×3 IMPLANT
GOWN STRL REUS W/TWL XL LVL3 (GOWN DISPOSABLE) ×5 IMPLANT
GUIDEWIRE ANG ZIPWIRE 038X150 (WIRE) IMPLANT
GUIDEWIRE STR DUAL SENSOR (WIRE) ×3 IMPLANT
HOLDER FOLEY CATH W/STRAP (MISCELLANEOUS) IMPLANT
INFUSOR MANOMETER BAG 3000ML (MISCELLANEOUS) IMPLANT
IV NS IRRIG 3000ML ARTHROMATIC (IV SOLUTION) ×9 IMPLANT
KIT TURNOVER CYSTO (KITS) ×3 IMPLANT
LOOP CUT BIPOLAR 24F LRG (ELECTROSURGICAL) ×3 IMPLANT
MANIFOLD NEPTUNE II (INSTRUMENTS) ×3 IMPLANT
NS IRRIG 500ML POUR BTL (IV SOLUTION) ×3 IMPLANT
PACK CYSTO (CUSTOM PROCEDURE TRAY) ×3 IMPLANT
SYR 30ML LL (SYRINGE) IMPLANT
TUBE CONNECTING 12X1/4 (SUCTIONS) IMPLANT
TUBING UROLOGY SET (TUBING) ×3 IMPLANT
WATER STERILE IRR 3000ML UROMA (IV SOLUTION) ×6 IMPLANT
WATER STERILE IRR 500ML POUR (IV SOLUTION) ×3 IMPLANT

## 2018-03-25 NOTE — Anesthesia Postprocedure Evaluation (Signed)
Anesthesia Post Note  Patient: Isaac [name]  Procedure(s) Performed: TRANSURETHRAL RESECTION OF BLADDER TUMOR (TURBT) (N/A Bladder) CYSTOSCOPY WITH RETROGRADE PYELOGRAM (Left Renal)     Patient location during evaluation: PACU Anesthesia Type: General Level of consciousness: awake and alert, oriented and patient cooperative Pain management: pain level controlled Vital Signs Assessment: post-procedure vital signs reviewed and stable Respiratory status: spontaneous breathing, nonlabored ventilation and respiratory function stable Cardiovascular status: blood pressure returned to baseline and stable Postop Assessment: no apparent nausea or vomiting, adequate PO intake and able to ambulate Anesthetic complications: no    Last Vitals:  Vitals:   03/25/18 1345 03/25/18 1400  BP: 114/79 121/78  Pulse: 77 75  Resp: 19 16  Temp:    SpO2: 95% 94%    Last Pain:  Vitals:   03/25/18 1520  TempSrc:   PainSc: 7                  Laurina Fischl,E. Analy [place]

## 2018-03-25 NOTE — Progress Notes (Signed)
Voicemail left on patient's cell phone to wait until 10:20 pm tonight before taking any more pain medications.

## 2018-03-25 NOTE — Interval H&P Note (Signed)
History and Physical Interval Note:  03/25/2018 11:12 AM  Isaac [name]  has presented today for surgery, with the diagnosis of RECURRENT BLADDER CANCER  The various methods of treatment have been discussed with the patient and family. After consideration of risks, benefits and other options for treatment, the patient has consented to  Procedure(s): TRANSURETHRAL RESECTION OF BLADDER TUMOR (TURBT) (N/A) CYSTOSCOPY WITH RETROGRADE PYELOGRAM (Left) as a surgical intervention .  The patient's history has been reviewed, patient examined, no change in status, stable for surgery.  I have reviewed the patient's chart and labs.  Questions were answered to the patient's satisfaction.     [name]

## 2018-03-25 NOTE — Progress Notes (Signed)
Instructions given to wait 6 hours before taking prescriptions medicaqtions since they were administered here.

## 2018-03-25 NOTE — Anesthesia Procedure Notes (Signed)
Procedure Name: Intubation Date/Time: 03/25/2018 11:34 AM Performed by: Wanita Chamberlain, CRNA Pre-anesthesia Checklist: Patient being monitored, Suction available, Emergency Drugs available, Patient identified and Timeout performed Patient Re-evaluated:Patient Re-evaluated prior to induction Oxygen Delivery Method: Circle system utilized Preoxygenation: Pre-oxygenation with 100% oxygen (Full beard 2-man technique) Induction Type: IV induction Ventilation: Two handed mask ventilation required Laryngoscope Size: Mac and 4 Grade View: Grade I Tube type: Oral (Cricoid manipulation) Tube size: 7.5 mm Number of attempts: 1 Airway Equipment and Method: Stylet Placement Confirmation: CO2 detector,  positive ETCO2,  ETT inserted through vocal cords under direct vision and breath sounds checked- equal and bilateral Secured at: 23 cm Tube secured with: Tape Dental Injury: Teeth and Oropharynx as per pre-operative assessment  Difficulty Due To: Difficulty was anticipated

## 2018-03-25 NOTE — Discharge Instructions (Signed)
Transurethral Resection of Bladder Tumor (TURBT) or Bladder Biopsy ° ° °Definition: ° Transurethral Resection of the Bladder Tumor is a surgical procedure used to diagnose and remove tumors within the bladder. TURBT is the most common treatment for early stage bladder cancer. ° °General instructions: °   ° Your recent bladder surgery requires very little post hospital care but some definite precautions. ° °Despite the fact that no skin incisions were used, the area around the bladder incisions are raw and covered with scabs to promote healing and prevent bleeding. Certain precautions are needed to insure that the scabs are not disturbed over the next 2-4 weeks while the healing proceeds. ° °Because the raw surface inside your bladder and the irritating effects of urine you may expect frequency of urination and/or urgency (a stronger desire to urinate) and perhaps even getting up at night more often. This will usually resolve or improve slowly over the healing period. You may see some blood in your urine over the first 6 weeks. Do not be alarmed, even if the urine was clear for a while. Get off your feet and drink lots of fluids until clearing occurs. If you start to pass clots or don't improve call us. ° °Diet: ° °You may return to your normal diet immediately. Because of the raw surface of your bladder, alcohol, spicy foods, foods high in acid and drinks with caffeine may cause irritation or frequency and should be used in moderation. To keep your urine flowing freely and avoid constipation, drink plenty of fluids during the day (8-10 glasses). Tip: Avoid cranberry juice because it is very acidic. ° °Activity: ° °Your physical activity doesn't need to be restricted. However, if you are very active, you may see some blood in the urine. We suggest that you reduce your activity under the circumstances until the bleeding has stopped. ° °Bowels: ° °It is important to keep your bowels regular during the postoperative  period. Straining with bowel movements can cause bleeding. A bowel movement every other day is reasonable. Use a mild laxative if needed, such as milk of magnesia 2-3 tablespoons, or 2 Dulcolax tablets. Call if you continue to have problems. If you had been taking narcotics for pain, before, during or after your surgery, you may be constipated. Take a laxative if necessary. ° ° ° °Medication: ° °You should resume your pre-surgery medications unless told not to. In addition you may be given an antibiotic to prevent or treat infection. Antibiotics are not always necessary. All medication should be taken as prescribed until the bottles are finished unless you are having an unusual reaction to one of the drugs. ° ° ° ° ° °Post Anesthesia Home Care Instructions ° °Activity: °Get plenty of rest for the remainder of the day. A responsible adult should stay with you for 24 hours following the procedure.  °For the next 24 hours, DO NOT: °-Drive a car °-Operate machinery °-Drink alcoholic beverages °-Take any medication unless instructed by your physician °-Make any legal decisions or sign important papers. ° °Meals: °Start with liquid foods such as gelatin or soup. Progress to regular foods as tolerated. Avoid greasy, spicy, heavy foods. If nausea and/or vomiting occur, drink only clear liquids until the nausea and/or vomiting subsides. Call your physician if vomiting continues. ° °Special Instructions/Symptoms: °Your throat may feel dry or sore from the anesthesia or the breathing tube placed in your throat during surgery. If this causes discomfort, gargle with warm salt water. The discomfort should disappear within 24   hours. ° °If you had a scopolamine patch placed behind your ear for the management of post- operative nausea and/or vomiting: ° °1. The medication in the patch is effective for 72 hours, after which it should be removed.  Wrap patch in a tissue and discard in the trash. Wash hands thoroughly with soap and  water. °2. You may remove the patch earlier than 72 hours if you experience unpleasant side effects which may include dry mouth, dizziness or visual disturbances. °3. Avoid touching the patch. Wash your hands with soap and water after contact with the patch. °  ° °

## 2018-03-25 NOTE — Anesthesia Preprocedure Evaluation (Addendum)
Anesthesia Evaluation  Patient identified by MRN, date of birth, ID band Patient awake    Reviewed: Allergy & Precautions, NPO status , Patient's Chart, lab work & pertinent test results, reviewed documented beta blocker date and time   History of Anesthesia Complications Negative for: history of anesthetic complications  Airway Mallampati: II  TM Distance: >3 FB Neck ROM: Full    Dental  (+) Chipped, Dental Advisory Given,    Pulmonary sleep apnea and Continuous Positive Airway Pressure Ventilation , former smoker,    breath sounds clear to auscultation       Cardiovascular hypertension, Pt. on medications and Pt. on home beta blockers (-) angina+ CAD (non-obstructive )   Rhythm:Regular Rate:Normal  '18 ECHO: EF 60-65%, valves OK '14  Myoview:  Low risk, EF 57%, small inf infarct   Neuro/Psych Anxiety Depression negative neurological ROS     GI/Hepatic Neg liver ROS, GERD  Medicated and Controlled,Elevated LFTs   Endo/Other  diabetes (glu 151), Oral Hypoglycemic Agents, Insulin Dependent  Renal/GU Renal InsufficiencyRenal disease (creat 1.70)Renal cancer: chemo   Urethral cancer, bladder cancer    Musculoskeletal  (+) Arthritis ,   Abdominal (+) + obese,   Peds  Hematology negative hematology ROS (+)   Anesthesia Other Findings   Reproductive/Obstetrics                           Anesthesia Physical Anesthesia Plan  ASA: III  Anesthesia Plan: General   Post-op Pain Management:    Induction: Intravenous  PONV Risk Score and Plan: 2 and Ondansetron and Dexamethasone  Airway Management Planned: Oral ETT  Additional Equipment:   Intra-op Plan:   Post-operative Plan: Extubation in OR  Informed Consent: I have reviewed the patients History and Physical, chart, labs and discussed the procedure including the risks, benefits and alternatives for the proposed anesthesia with the  patient or authorized representative who has indicated his/her understanding and acceptance.   Dental advisory given  Plan Discussed with: CRNA and Surgeon  Anesthesia Plan Comments: (Plan routine monitors, GETA)       Anesthesia Quick Evaluation

## 2018-03-25 NOTE — H&P (Signed)
f/u for transitional cell carcinoma  HPI: Isaac [name] is a 62 year-old male established patient who is here for surveillance of bladder cancer.  He underwent a TURBT. His last bladder tumor was resected 04/08/2012. The patient had a right nephrouretectomy. This was removed on 02/12/2017.   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   His last radiologic test to evaluate the kidneys was 12/13/2017. Imaging results: PET CT - NED. The patient did not have labs prior to his office visit today.   He has had blood in his urine recently.   The patient has a significant extensive history of transitional cell carcinoma. He has had numerous bladder recurrences and also underwent a distal right ureterectomy with reimplantation by myself in 2010. His last bladder recurrence was in November 2013. He presented in August 2017 with gross hematuria with a negative culture. CT scan did not show an explanation for his hematuria. He did have urine cytology as well on his October 2017 follow-up, which was negative.   CT results from 12/2015: No CT findings to account for the patient's hematuria. Mild hepatic steatosis. Colonic diverticulosis, without evidence of diverticulitis. Small to moderate fat containing left inguinal hernia.   12/2016: Repeat cystoscopy demonstrated no evidence of recurrence. However, given the patient's ongoing gross hematuria CAT scan was repeated. The CAT scan this time demonstrated a large filling defect in the right collecting system with some enhancement of that region concerning for urothelial carcinoma.   9/18: the patient underwent cystoscopy and right ureteroscopy with biopsy of the area in the right kidney. Ureteroscopy demonstrated at least 3 calyces that were filled with papillary tumors consistent with transitional cell carcinoma. The biopsy demonstrated a  low-grade TCC.   10/18: Status post right nephroureterectomy  11/18: The patient received adjuvant gem/cis chemotherapy.   Interval: The patient seen today for follow-up. He has not been seen since his postop appointment in October. He complains of hematuria. He is also having some left-sided abdominal pain. He denies any dysuria. He denies any fevers or chills.      ALLERGIES: No Allergies    MEDICATIONS: Aspirin Low Dose TABS 1 Oral  Crestor 10 mg tablet 0 Oral  Cymbalta  Diazepam 10 mg tablet 2 tablet PO before procedure  Glimepiride 2 mg tablet 0 Oral  GlyBURIDE 5 MG Oral Tablet 1 Oral Daily  Hydrocodone-Acetaminophen 5 mg-325 mg tablet 1 tablet PO Q 6 H PRN pain  Isosorbide Mononitrate Er 120 mg tablet, extended release 24 hr 1 Oral Daily  Lantus 100 unit/ml vial 0 Subcutaneous  Norvasc 5 mg tablet 2 Oral  Pantoprazole Sodium 40 mg tablet, delayed release 0 Oral  Ropinirole Hcl 1 mg tablet 0 Oral  Sildenafil 20 mg tablet 1-2 tablet PO Daily  Toprol Xl 25 mg tablet, extended release 24 hr 1 Oral Daily     GU PSH: Bladder Instill AntiCA Agent - 2013, 2012 Cysto Bladder Ureth Biopsy - 2010, 2009, 2008 Cysto Uretero Biopsy Fulgura, Right - 01/21/2017, 2010, 2009 Cystoscopy - 12/17/2016, 2018, 2017 Cystoscopy Insert Stent, Right - 01/21/2017, 2010, 2009 Cystoscopy TURBT <2 cm - 2013, 2012, 2012 Cystoscopy TURBT 2-5 cm - 2013 Cystoscopy Ureteroscopy - 2012, 2012, 2010, 2010 Inject For cystogram - 02/23/2017 Lap Nephro Ureterectomy, Right - 02/12/2017 Locm 300-399Mg /Ml Iodine,1Ml - 12/30/2016, 2017 Reimplant Hitch or Flap - 2010      PSH Notes: Knee Replacement, Knee Arthroscopy, Bladder Injection Of  Cancer Treatment, Cystoscopy With Fulguration Medium Lesion (2-5cm), Cystoscopy With Fulguration Small Lesion (5-98mm), Bladder Injection Of Cancer Treatment, Cystoscopy With Ureteroscopy Right, Cystoscopy With Fulguration Small Lesion (5-82mm), Cystoscopy With Fulguration Small Lesion  (5-78mm), Cystoscopy With Ureteroscopy Right, Cystoscopy With Ureteroscopy Right, Ureteroneocystostomy With Bladder Flap, Cystoscopy With Insertion Of Ureteral Stent Right, Cystoscopy With Ureteroscopy For Biopsy Right, Cystoscopy With Ureteroscopy Right, Cystoscopy With Biopsy, Sinus Surgery, Cystoscopy With Ureteroscopy With Fulguration Of Lesion, Cystoscopy With Insertion Of Ureteral Stent Right, Cystoscopy With Biopsy, Cystoscopy With Biopsy, Shoulder Surgery, Elbow Surgery, Bladder Surgery, Appendectomy, Inguinal Hernia Repair   NON-GU PSH: Appendectomy - 2008 Revise Knee Joint - 2017    GU PMH: Renal pelvis cancer, 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, - 02/03/2017 Flank Pain - 02/05/2017 Gross hematuria - 11/17/2016, - 2017 Weak Urinary Stream - 2018 Acute Cystitis/UTI (Improving) - 2017, - 2017, Acute cystitis without hematuria, - 2017 BPH w/LUTS - 2017 History of bladder cancer, History of bladder cancer - 2017, History of bladder cancer, - 2016, History of malignant neoplasm of bladder, - 2015 BPH w/o LUTS, Enlarged prostate without lower urinary tract symptoms (luts) - 2017 ED due to arterial insufficiency, Erectile dysfunction due to arterial insufficiency - 2016 Bladder Cancer, Unspec, Bladder cancer - 2014      PMH Notes:  Past Gu Hx:    [name] returns for follow-up. We got involved in his care in 2010 due to transitional cell carcinoma involving his distal right ureter. Prior to that, Dr. 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 He is status post right ureteral resection with reimplantation due to recurrent transitional cell carcinoma of the bladder and involvement of the distal right ureter (June 2010). Again, we felt nephroureterectomy would be overaggressive therapy. The patient had reassessment (02/2009). This included a cystogram which showed nice reflux up the right side with no evidence of stricturing,  no evidence of filling defects. We were able to drive the ureteroscope right up the ureter as well and has had no evidence of recurrence. Urine cytology was unremarkable and there was no evidence of any bladder cancer. Overall we were quite pleased with things and he has recovered from that procedure very nicely. He continues to do well. Follow up 06/2009 including cystoscopy and NMP-22 was negative.    Follow up 10/2009 cystoscopy and cytology were negative.   Follow up 02/2010 cystoscopy and cytology were negative CT scan also ok.    [name] status post his most recent cystoscopy with bladder biopsy. Since my distal ureterectomy with reimplant, he has had no evidence of recurrences until his follow-up in February of 2012 where we saw some carpeting of papillary tumor. The patient recently underwent cold-cup biopsy/resection of these areas along with fulguration. Pathology did indeed confirm this to be transitional cell carcinoma, but fortunately it was low-grade and noninvasive. He has had 2 induction courses of BCG and did not really tolerate the last one that well.    [name] was noted to have several small bladder tumor recurrences in November of 2012. The patient had papillary tumors noted. These were noninvasive, felt to be low grade on the trigone. The posterior wall tumor looked mostly low grade with some small little foci of potentially higher grade tumor, but again, no evidence of invasion on either lesion. The patient did have installation of mitomycin.    His most recent recurrence was in his bladder in November of 2013. He had a  solitary tumor at the dome of his bladder. Pathology revealed this to be low-grade and noninvasive.    [name] did undergo retrograde pyelography as well as cystogram to look at the upper tracts back in December 2013 and there was no evidence of filling defect or ureteral obstruction.    April of 2014. At that time cystoscopy was negative and NMP 22 testing was  also negative.   August 2014: Negative cystoscopy   December 2014: Negative cystoscopy and NMP-22 testing.   June 2015: Negative cystoscopy. Positive atypia on urine cytology with negative FISH testing   January 2016: Negative cystoscopy and negative upper tract imaging by hematuria protocol CT scan      NON-GU PMH: Encounter for general adult medical examination without abnormal findings, Encounter for preventive health examination - 05-Aug-2013 Personal history of other diseases of the circulatory system, History of hypertension - Aug 05, 2012 Personal history of other diseases of the digestive system, History of esophageal reflux - 08/05/12 Personal history of other diseases of the nervous system and sense organs, History of sleep apnea - 08/05/12 Personal history of other endocrine, nutritional and metabolic disease, History of hypercholesterolemia - 2012-08-05, History of diabetes mellitus, - 08-05-2012 Personal history of other mental and behavioral disorders, History of depression - 2012/08/05    FAMILY HISTORY: Death In The Family Father - Father Death In The Family Mother - Mother Diabetes - Grandmother   SOCIAL HISTORY: Marital Status: Single Preferred Language: English; Ethnicity: Not Hispanic Or Latino; Race: White Current Smoking Status: Patient does not smoke anymore.      Notes: Former smoker, Marital History - Single, Tobacco Use, Caffeine Use, Alcohol Use   REVIEW OF SYSTEMS:    GU Review Male:   Patient denies frequent urination, hard to postpone urination, burning/ pain with urination, get up at night to urinate, leakage of urine, stream starts and stops, trouble starting your stream, have to strain to urinate , erection problems, and penile pain.  Gastrointestinal (Upper):   Patient denies nausea, vomiting, and indigestion/ heartburn.  Gastrointestinal (Lower):   Patient denies diarrhea and constipation.  Constitutional:   Patient denies fever, night sweats, weight loss, and fatigue.  Skin:   Patient  denies skin rash/ lesion and itching.  Eyes:   Patient denies blurred vision and double vision.  Ears/ Nose/ Throat:   Patient denies sore throat and sinus problems.  Hematologic/Lymphatic:   Patient denies swollen glands and easy bruising.  Cardiovascular:   Patient denies leg swelling and chest pains.  Respiratory:   Patient denies cough and shortness of breath.  Endocrine:   Patient denies excessive thirst.  Musculoskeletal:   Patient denies back pain and joint pain.  Neurological:   Patient denies headaches and dizziness.  Psychologic:   Patient denies depression and anxiety.   VITAL SIGNS:      03/08/2018 03:48 PM  Weight 200 lb / 90.72 kg  Height 70 in / 177.8 cm  BP 121/75 mmHg  Pulse 83 /min  BMI 28.7 kg/m   MULTI-SYSTEM PHYSICAL EXAMINATION:    Constitutional: Well-nourished. No physical deformities. Normally developed. Good grooming.  Respiratory: Normal breath sounds. No labored breathing, no use of accessory muscles.   Cardiovascular: Regular rate and rhythm. No murmur, no gallop. Normal temperature, normal extremity pulses, no swelling, no varicosities.   Gastrointestinal: No mass, no tenderness, no rigidity, non obese abdomen. Incisions are clean/dry/intact, well-healed.     PAST DATA REVIEWED:  Source Of History:  Patient  Records Review:   Pathology  Reports, Previous Doctor Records, Previous Patient Records, POC Tool  X-Ray Review: PET Scan: Reviewed Films. Discussed With Patient.     11/17/14 03/30/12 08/28/08 05/20/06  PSA  Total PSA 0.58  0.44  0.35  1.13     06/04/05  Hormones  Testosterone, Total 2.68     PROCEDURES:         Flexible Cystoscopy - 52000  Risks, benefits, and some of the potential complications of the procedure were discussed at length with the patient including infection, bleeding, voiding discomfort, urinary retention, fever, chills, sepsis, and others. All questions were answered. Informed consent was obtained. Sterile technique and  intraurethral analgesia were used.  Meatus:  Normal size. Normal location. Normal condition.  Urethra:  No strictures.  External Sphincter:  Normal.  Verumontanum:  Normal.  Prostate:  Non-obstructing. No hyperplasia.  Bladder Neck:  Small papillary lesion at the 12 o'clock position, less than 5 mm  Ureteral Orifices:  Normal location. Normal size. Normal shape. Effluxed clear urine.  Bladder:  Proximally for papillary tumors the posterior wall on the right side of the patient's bladder, in total, approximately 2 cm in size.      The lower urinary tract was carefully examined. The procedure was well-tolerated and without complications. Antibiotic instructions were given. Instructions were given to call the office immediately for bloody urine, difficulty urinating, urinary retention, painful or frequent urination, fever, chills, nausea, vomiting or other illness. The patient stated that he understood these instructions and would comply with them.         Urinalysis w/Scope - 81001 Dipstick Dipstick Cont'd Micro  Color: Yellow Bilirubin: Neg WBC/hpf: NS (Not Seen)  Appearance: Clear Ketones: Neg RBC/hpf: 20 - 40/hpf  Specific Gravity: 1.015 Blood: 3+ Bacteria: NS (Not Seen)  pH: 5.5 Protein: Trace Cystals: NS (Not Seen)  Glucose: 2+ Urobilinogen: 0.2 Casts: NS (Not Seen)    Nitrites: Neg Trichomonas: Not Present    Leukocyte Esterase: Neg Mucous: Not Present      Epithelial Cells: 0 - 5/hpf      Yeast: NS (Not Seen)      Sperm: Not Present    ASSESSMENT:      ICD-10 Details  1 GU:   Bladder Cancer overlapping sites - C67.8    PLAN:           Document Letter(s):  Created for Patient: Clinical Summary         Notes:   The patient presents today for follow-up. He has had a few weeks of gross hematuria, painless. He did have a PET/CT scan in August 2019 that demonstrated no evidence of recurrence of his disease. There were some enlarged pelvic lymph nodes, considered to be reactive as  they will been stable in nature. Unfortunately, his cystoscopy shows recurrence of his transitional cell carcinoma in the bladder. I suspect this is the etiology of his hematuria.   I recommended that the patient proceed to the operating room for transurethral resection of his bladder tumor and a left retrograde pyelogram. The patient has been through this process before, and it is very familiar with the operation. We briefly discussed the risk and the benefits of the operation. We'll try to get this scheduled as soon as possible. I did request that the patient be seen or cleared by his primary care provider given that he has had difficulty with his diabetes over the past 6-10 months, and as such I would like reassurance that his diabetes is well controlled and his  risk for developing a heart attack or stroke because of it is limited.

## 2018-03-25 NOTE — Op Note (Signed)
Preoperative diagnosis:  1. Recurrent bladder cancer  Postoperative diagnosis:  1. Same  Procedure: 1. Transurethral resection of bladder tumor, 2 cm with the largest lesion, 5 cm in aggregate, overlapping sites 2. Left retrograde pyelogram with interpretation  Surgeon: [name], MD  Anesthesia: General  Complications: None  Intraoperative findings:  #1: There was a tumor overlying the patient's left ureteral orifice that I had to resect prior to performing the retrograde pyelogram.  The retrograde pyelogram was performed using 10 cc of Omnipaque contrast through a 5 [place] open-ended ureteral catheter.  It was normal, demonstrated normal caliber ureter with no filling defects or hydroureteronephrosis.  The calyces were sharp. 2.:  The patient had bladder tumors at the bladder neck at the 12 o'clock position, the left ureteral orifice, and 4 tumors in the posterior bladder wall/dome.  There were 4 in the posterior wall. 3.:  The tumors were resected down to muscle.  The tumor is in the posterior wall did require the extra long resectoscope because of their position.  EBL: Minimal  Specimens: Recurrent bladder tumors  Indication: Isaac [name] is a 62 y.o. patient with history of transitional cell carcinoma of the bladder as well as the right ureter.  He is undergone numerous transurethral resections of these bladder tumors as well as a right nephro ureterectomy.  He was noted to have recurrent bladder tumors that his most recent surveillance cystoscopy.  After reviewing the management options for treatment, he elected to proceed with the above surgical procedure(s). We have discussed the potential benefits and risks of the procedure, side effects of the proposed treatment, the likelihood of the patient achieving the goals of the procedure, and any potential problems that might occur during the procedure or recuperation. Informed consent has been obtained.  Description of  procedure:  The patient was taken to the operating room and general anesthesia was induced.  The patient was placed in the dorsal lithotomy position, prepped and draped in the usual sterile fashion, and preoperative antibiotics were administered. A preoperative time-out was performed.   21 French 30 degrees cystoscope was then gently passed to the patient's urethra and into the bladder under visual guidance.  Cystoscopy then demonstrated the above findings.  I was unable to perform the retrograde pyelogram at this time and exchange the 21 French resectoscope sheath for a 26 French resectoscope sheath using a visual obturator.  I was able to take care of the tumor overlying the left ureteral orifice as well as the 2 of the lesions in the posterior bladder wall.  However, the remaining 2 lesions were too deep to use this resectoscope.  As such, had to pass in the extra long resectoscope (monopolar).  This was done using the visual obturator.  Once the lesion at the bladder dome was completely resected and fulgurated I emptied out all the bladder chips.  I then read the past the normal resectoscope and resected the bladder tumor at the 12 o'clock position on the bladder neck.  I then fulgurated the base of all the areas and ensured hemostasis.  Next I performed a retrograde pyelogram using a 5 French open ended ureteral catheter with the above findings.  Reinspection of the resected areas demonstrated no significant hematuria or bleeding.  As such, the scope was removed.  Lidocaine jelly was instilled in the patient's urethra and a B&O suppository was instilled in the patient's rectum.  Exam under anesthesia demonstrated a mobile bladder and a normal prostate.  The patient  was subsequently extubated return the PACU in stable condition.  [name], M.D.

## 2018-03-25 NOTE — Transfer of Care (Signed)
Immediate Anesthesia Transfer of Care Note  Patient: Isaac [name]  Procedure(s) Performed: TRANSURETHRAL RESECTION OF BLADDER TUMOR (TURBT) (N/A Bladder) CYSTOSCOPY WITH RETROGRADE PYELOGRAM (Left Renal)  Patient Location: PACU  Anesthesia Type:General  Level of Consciousness: awake, alert , oriented and patient cooperative  Airway & Oxygen Therapy: Patient Spontanous Breathing and Patient connected to nasal cannula oxygen  Post-op Assessment: Report given to RN and Post -op Vital signs reviewed and stable  Post vital signs: Reviewed and stable  Last Vitals:  Vitals Value Taken Time  BP    Temp    Pulse 72 03/25/2018  1:12 PM  Resp 15 03/25/2018  1:12 PM  SpO2 96 % 03/25/2018  1:12 PM  Vitals shown include unvalidated device data.  Last Pain:  Vitals:   03/25/18 1003  TempSrc: Oral         Complications: No apparent anesthesia complications

## 2018-03-26 ENCOUNTER — Encounter (HOSPITAL_BASED_OUTPATIENT_CLINIC_OR_DEPARTMENT_OTHER): Payer: Self-pay | Admitting: Urology

## 2018-03-29 ENCOUNTER — Telehealth: Payer: Self-pay

## 2018-03-29 DIAGNOSIS — E1165 Type 2 diabetes mellitus with hyperglycemia: Secondary | ICD-10-CM | POA: Diagnosis not present

## 2018-03-29 DIAGNOSIS — Z794 Long term (current) use of insulin: Secondary | ICD-10-CM | POA: Diagnosis not present

## 2018-03-29 NOTE — Telephone Encounter (Signed)
Left message for patient to call back we need to repeat a BMET.

## 2018-03-30 ENCOUNTER — Encounter: Payer: Medicare Other | Admitting: Internal Medicine

## 2018-03-31 ENCOUNTER — Telehealth: Payer: Self-pay | Admitting: *Deleted

## 2018-03-31 NOTE — Telephone Encounter (Signed)
Patient had TURBT for bladder cancer on 03/25/18. He is not currently on chemo and he will not see his urologist until 04/12/18, so he does not know what the next step is until then. Patient denies any GI concerns, he is for a recall colon hx colon polyps. Last colon 2013. Ok to have his colonoscopy as scheduled on 04/20/18. Please advise. Thank you, Lasheika Ortloff PV.

## 2018-03-31 NOTE — Telephone Encounter (Signed)
Yes it should be fine to proceed with colonoscopy as scheduled

## 2018-03-31 NOTE — Telephone Encounter (Signed)
Noted! Thank you

## 2018-04-06 ENCOUNTER — Encounter: Payer: Self-pay | Admitting: Gastroenterology

## 2018-04-06 ENCOUNTER — Ambulatory Visit (AMBULATORY_SURGERY_CENTER): Payer: Self-pay

## 2018-04-06 VITALS — Ht 70.0 in | Wt 203.4 lb

## 2018-04-06 DIAGNOSIS — E119 Type 2 diabetes mellitus without complications: Secondary | ICD-10-CM | POA: Diagnosis not present

## 2018-04-06 DIAGNOSIS — Z794 Long term (current) use of insulin: Secondary | ICD-10-CM | POA: Diagnosis not present

## 2018-04-06 DIAGNOSIS — Z86010 Personal history of colonic polyps: Secondary | ICD-10-CM

## 2018-04-06 MED ORDER — NA SULFATE-K SULFATE-MG SULF 17.5-3.13-1.6 GM/177ML PO SOLN: 1.0000 | Freq: Once | ORAL | 0 refills | Status: AC

## 2018-04-06 NOTE — Progress Notes (Signed)
Denies allergies to eggs or soy products. Denies complication of anesthesia or sedation. Denies use of weight loss medication. Denies use of O2.   Emmi instructions declined.  

## 2018-04-12 DIAGNOSIS — C678 Malignant neoplasm of overlapping sites of bladder: Secondary | ICD-10-CM | POA: Diagnosis not present

## 2018-04-12 DIAGNOSIS — R8271 Bacteriuria: Secondary | ICD-10-CM | POA: Diagnosis not present

## 2018-04-14 ENCOUNTER — Other Ambulatory Visit: Payer: Self-pay | Admitting: *Deleted

## 2018-04-14 MED ORDER — AMOXICILLIN 500 MG PO TABS: ORAL_TABLET | ORAL | 3 refills | Status: DC

## 2018-04-14 MED FILL — AMOXICILLIN 500 MG CAPSULE: 500 | 2 days supply | Qty: 8 | Fill #0

## 2018-04-20 ENCOUNTER — Ambulatory Visit (AMBULATORY_SURGERY_CENTER): Payer: Medicare Other | Admitting: Gastroenterology

## 2018-04-20 ENCOUNTER — Encounter: Payer: Self-pay | Admitting: Gastroenterology

## 2018-04-20 VITALS — BP 127/80 | HR 68 | Temp 98.6°F | Resp 12 | Ht 69.0 in | Wt 205.0 lb

## 2018-04-20 DIAGNOSIS — D125 Benign neoplasm of sigmoid colon: Secondary | ICD-10-CM | POA: Diagnosis not present

## 2018-04-20 DIAGNOSIS — Z86010 Personal history of colonic polyps: Secondary | ICD-10-CM

## 2018-04-20 DIAGNOSIS — D123 Benign neoplasm of transverse colon: Secondary | ICD-10-CM

## 2018-04-20 DIAGNOSIS — Z860101 Personal history of adenomatous and serrated colon polyps: Secondary | ICD-10-CM

## 2018-04-20 MED ORDER — SODIUM CHLORIDE 0.9 % IV SOLN: 500.0000 mL | Freq: Once | INTRAVENOUS | Status: DC

## 2018-04-20 NOTE — Patient Instructions (Signed)
Handout on polyps, diverticulosis, high fiber diet and hemorrhoids given    YOU HAD AN ENDOSCOPIC PROCEDURE TODAY AT [facility]:   Refer to the procedure report that was given to you for any specific questions about what was found during the examination.  If the procedure report does not answer your questions, please call your gastroenterologist to clarify.  If you requested that your care partner not be given the details of your procedure findings, then the procedure report has been included in a sealed envelope for you to review at your convenience later.  YOU SHOULD EXPECT: Some feelings of bloating in the abdomen. Passage of more gas than usual.  Walking can help get rid of the air that was put into your GI tract during the procedure and reduce the bloating. If you had a lower endoscopy (such as a colonoscopy or flexible sigmoidoscopy) you may notice spotting of blood in your stool or on the toilet paper. If you underwent a bowel prep for your procedure, you may not have a normal bowel movement for a few days.  Please Note:  You might notice some irritation and congestion in your nose or some drainage.  This is from the oxygen used during your procedure.  There is no need for concern and it should clear up in a day or so.  SYMPTOMS TO REPORT IMMEDIATELY:   Following lower endoscopy (colonoscopy or flexible sigmoidoscopy):  Excessive amounts of blood in the stool  Significant tenderness or worsening of abdominal pains  Swelling of the abdomen that is new, acute  Fever of 100F or higher   For urgent or emergent issues, a gastroenterologist can be reached at any hour by calling [phone].   DIET:  We do recommend a small meal at first, but then you may proceed to your regular diet.  Drink plenty of fluids but you should avoid alcoholic beverages for 24 hours.  ACTIVITY:  You should plan to take it easy for the rest of today and you should NOT DRIVE or use heavy  machinery until tomorrow (because of the sedation medicines used during the test).    FOLLOW UP: Our staff will call the number listed on your records the next business day following your procedure to check on you and address any questions or concerns that you may have regarding the information given to you following your procedure. If we do not reach you, we will leave a message.  However, if you are feeling well and you are not experiencing any problems, there is no need to return our call.  We will assume that you have returned to your regular daily activities without incident.  If any biopsies were taken you will be contacted by phone or by letter within the next 1-3 weeks.  Please call us at [phone] if you have not heard about the biopsies in 3 weeks.    SIGNATURES/CONFIDENTIALITY: You and/or your care partner have signed paperwork which will be entered into your electronic medical record.  These signatures attest to the fact that that the information above on your After Visit Summary has been reviewed and is understood.  Full responsibility of the confidentiality of this discharge information lies with you and/or your care-partner.

## 2018-04-20 NOTE — Progress Notes (Signed)
Pt has a port a cath used  for chemo treatments. Last treatment in March.

## 2018-04-20 NOTE — Progress Notes (Signed)
Pt's states no medical or surgical changes since previsit or office visit. 

## 2018-04-20 NOTE — Progress Notes (Signed)
Spontaneous respirations throughout. VSS. Resting comfortably. To PACU on room air. Report to  RN. 

## 2018-04-20 NOTE — Op Note (Signed)
[place] Patient Name: Isaac [name] Procedure Date: 04/20/2018 9:23 AM MRN: [id number] Endoscopist: [name] , MD Age: 62 Referring MD:  Date of Birth: April 13, 1956 Gender: Male Account #: [id number] Procedure:                Colonoscopy Indications:              Surveillance: Personal history of adenomatous                            polyps on last colonoscopy 5 years ago Medicines:                Monitored Anesthesia Care Procedure:                Pre-Anesthesia Assessment:                           - Prior to the procedure, a History and Physical                            was performed, and patient medications and                            allergies were reviewed. The patient's tolerance of                            previous anesthesia was also reviewed. The risks                            and benefits of the procedure and the sedation                            options and risks were discussed with the patient.                            All questions were answered, and informed consent                            was obtained. Prior Anticoagulants: The patient has                            taken no previous anticoagulant or antiplatelet                            agents. ASA Grade Assessment: III - A patient with                            severe systemic disease. After reviewing the risks                            and benefits, the patient was deemed in                            satisfactory condition to undergo the procedure.  After obtaining informed consent, the colonoscope                            was passed under direct vision. Throughout the                            procedure, the patient's blood pressure, pulse, and                            oxygen saturations were monitored continuously. The                            Colonoscope was introduced through the anus and                            advanced to the the  cecum, identified by                            appendiceal orifice and ileocecal valve. The                            ileocecal valve, appendiceal orifice, and rectum                            were photographed. The quality of the bowel                            preparation was adequate. The colonoscopy was                            performed without difficulty. The patient tolerated                            the procedure well. Scope In: 9:29:24 AM Scope Out: 9:47:30 AM Scope Withdrawal Time: 0 hours 16 minutes 57 seconds  Total Procedure Duration: 0 hours 18 minutes 6 seconds  Findings:                 The perianal and digital rectal examinations were                            normal.                           Two pedunculated polyps were found in the sigmoid                            colon and transverse colon. The polyps were 11 to                            14 mm in size. These polyps were removed with a hot                            snare. Resection and retrieval were complete.  Three sessile polyps were found in the sigmoid                            colon (2) and transverse colon (1). The polyps were                            4 to 5 mm in size. These polyps were removed with a                            cold biopsy forceps. Resection and retrieval were                            complete.                           Multiple medium-mouthed diverticula were found in                            the left colon.                           Internal hemorrhoids were found during                            retroflexion. The hemorrhoids were small and Grade                            I (internal hemorrhoids that do not prolapse).                           The exam was otherwise without abnormality on                            direct and retroflexion views. Complications:            No immediate complications. Estimated blood loss:                             None. Estimated Blood Loss:     Estimated blood loss: none. Impression:               - Two 11 to 14 mm polyps in the sigmoid colon and                            in the transverse colon, removed with a hot snare.                            Resected and retrieved.                           - Three 4 to 5 mm polyps in the sigmoid colon and                            in the transverse colon, removed with a cold biopsy  forceps. Resected and retrieved.                           - Diverticulosis in the left colon.                           - Internal hemorrhoids.                           - The examination was otherwise normal on direct                            and retroflexion views. Recommendation:           - Repeat colonoscopy in 3 years for surveillance.                           - Patient has a contact number available for                            emergencies. The signs and symptoms of potential                            delayed complications were discussed with the                            patient. Return to normal activities tomorrow.                            Written discharge instructions were provided to the                            patient.                           - High fiber diet.                           - Continue present medications.                           - Await pathology results. [name], MD 04/20/2018 9:55:46 AM This report has been signed electronically.

## 2018-04-20 NOTE — Progress Notes (Signed)
Called to room to assist during endoscopic procedure.  Patient ID and intended procedure confirmed with present staff. Received instructions for my participation in the procedure from the performing physician.  

## 2018-04-21 ENCOUNTER — Telehealth: Payer: Self-pay

## 2018-04-21 NOTE — Telephone Encounter (Signed)
  Follow up Call-  Call back number 04/20/2018  Post procedure Call Back phone  # [phone]  Permission to leave phone message Yes  Some recent data might be hidden     Patient questions:  Do you have a fever, pain , or abdominal swelling? No. Pain Score  0 *  Have you tolerated food without any problems? Yes.    Have you been able to return to your normal activities? Yes.    Do you have any questions about your discharge instructions: Diet   No. Medications  No. Follow up visit  No.  Do you have questions or concerns about your Care? No.  Actions: * If pain score is 4 or above: No action needed, pain <4.

## 2018-04-26 ENCOUNTER — Other Ambulatory Visit: Payer: Self-pay

## 2018-04-26 ENCOUNTER — Encounter (HOSPITAL_COMMUNITY)
Admission: RE | Admit: 2018-04-26 | Discharge: 2018-04-26 | Disposition: A | Discharge status: Home / Self Care | Payer: Medicare Other | Source: Ambulatory Visit | Attending: Urology | Admitting: Urology

## 2018-04-26 ENCOUNTER — Encounter (HOSPITAL_COMMUNITY): Payer: Self-pay

## 2018-04-26 DIAGNOSIS — Z794 Long term (current) use of insulin: Secondary | ICD-10-CM

## 2018-04-26 DIAGNOSIS — E1122 Type 2 diabetes mellitus with diabetic chronic kidney disease: Secondary | ICD-10-CM

## 2018-04-26 DIAGNOSIS — I129 Hypertensive chronic kidney disease with stage 1 through stage 4 chronic kidney disease, or unspecified chronic kidney disease: Secondary | ICD-10-CM | POA: Insufficient documentation

## 2018-04-26 DIAGNOSIS — N183 Chronic kidney disease, stage 3 (moderate): Secondary | ICD-10-CM | POA: Insufficient documentation

## 2018-04-26 DIAGNOSIS — Z7982 Long term (current) use of aspirin: Secondary | ICD-10-CM

## 2018-04-26 DIAGNOSIS — C679 Malignant neoplasm of bladder, unspecified: Secondary | ICD-10-CM

## 2018-04-26 DIAGNOSIS — Z79899 Other long term (current) drug therapy: Secondary | ICD-10-CM | POA: Insufficient documentation

## 2018-04-26 DIAGNOSIS — I252 Old myocardial infarction: Secondary | ICD-10-CM | POA: Diagnosis not present

## 2018-04-26 DIAGNOSIS — K219 Gastro-esophageal reflux disease without esophagitis: Secondary | ICD-10-CM | POA: Diagnosis not present

## 2018-04-26 DIAGNOSIS — N3020 Other chronic cystitis without hematuria: Secondary | ICD-10-CM | POA: Diagnosis not present

## 2018-04-26 DIAGNOSIS — Z01818 Encounter for other preprocedural examination: Secondary | ICD-10-CM | POA: Insufficient documentation

## 2018-04-26 DIAGNOSIS — C678 Malignant neoplasm of overlapping sites of bladder: Secondary | ICD-10-CM | POA: Diagnosis not present

## 2018-04-26 DIAGNOSIS — Z96659 Presence of unspecified artificial knee joint: Secondary | ICD-10-CM | POA: Diagnosis not present

## 2018-04-26 DIAGNOSIS — E785 Hyperlipidemia, unspecified: Secondary | ICD-10-CM | POA: Diagnosis not present

## 2018-04-26 DIAGNOSIS — F329 Major depressive disorder, single episode, unspecified: Secondary | ICD-10-CM | POA: Diagnosis not present

## 2018-04-26 DIAGNOSIS — E7800 Pure hypercholesterolemia, unspecified: Secondary | ICD-10-CM | POA: Diagnosis not present

## 2018-04-26 DIAGNOSIS — I10 Essential (primary) hypertension: Secondary | ICD-10-CM | POA: Diagnosis not present

## 2018-04-26 DIAGNOSIS — I2510 Atherosclerotic heart disease of native coronary artery without angina pectoris: Secondary | ICD-10-CM | POA: Diagnosis not present

## 2018-04-26 DIAGNOSIS — F419 Anxiety disorder, unspecified: Secondary | ICD-10-CM | POA: Diagnosis not present

## 2018-04-26 DIAGNOSIS — G4730 Sleep apnea, unspecified: Secondary | ICD-10-CM | POA: Diagnosis not present

## 2018-04-26 DIAGNOSIS — Z8553 Personal history of malignant neoplasm of renal pelvis: Secondary | ICD-10-CM | POA: Diagnosis not present

## 2018-04-26 DIAGNOSIS — E119 Type 2 diabetes mellitus without complications: Secondary | ICD-10-CM | POA: Diagnosis not present

## 2018-04-26 DIAGNOSIS — Z87891 Personal history of nicotine dependence: Secondary | ICD-10-CM | POA: Diagnosis not present

## 2018-04-26 DIAGNOSIS — N3080 Other cystitis without hematuria: Secondary | ICD-10-CM | POA: Diagnosis not present

## 2018-04-26 DIAGNOSIS — N5201 Erectile dysfunction due to arterial insufficiency: Secondary | ICD-10-CM | POA: Diagnosis not present

## 2018-04-26 DIAGNOSIS — Z8554 Personal history of malignant neoplasm of ureter: Secondary | ICD-10-CM | POA: Diagnosis not present

## 2018-04-26 HISTORY — DX: Urgency of urination: R39.15

## 2018-04-26 HISTORY — DX: Drug-induced polyneuropathy: T45.1X5A

## 2018-04-26 HISTORY — DX: Nocturia: R35.1

## 2018-04-26 HISTORY — DX: Adverse effect of antineoplastic and immunosuppressive drugs, initial encounter: G62.0

## 2018-04-26 LAB — CBC
HCT: 35.9 % — ABNORMAL LOW (ref 39.0–52.0)
HEMOGLOBIN: 12 g/dL — AB (ref 13.0–17.0)
MCH: 33.6 pg (ref 26.0–34.0)
MCHC: 33.4 g/dL (ref 30.0–36.0)
MCV: 100.6 fL — AB (ref 80.0–100.0)
NRBC: 0 % (ref 0.0–0.2)
Platelets: 123 10*3/uL — ABNORMAL LOW (ref 150–400)
RBC: 3.57 MIL/uL — AB (ref 4.22–5.81)
RDW: 13.3 % (ref 11.5–15.5)
WBC: 4.3 10*3/uL (ref 4.0–10.5)

## 2018-04-26 LAB — BASIC METABOLIC PANEL
ANION GAP: 8 (ref 5–15)
BUN: 30 mg/dL — AB (ref 8–23)
CHLORIDE: 103 mmol/L (ref 98–111)
CO2: 24 mmol/L (ref 22–32)
Calcium: 9 mg/dL (ref 8.9–10.3)
Creatinine, Ser: 2.24 mg/dL — ABNORMAL HIGH (ref 0.61–1.24)
GFR calc Af Amer: 35 mL/min — ABNORMAL LOW (ref >60)
GFR, EST NON AFRICAN AMERICAN: 30 mL/min — AB (ref >60)
Glucose, Bld: 303 mg/dL — ABNORMAL HIGH (ref 70–99)
Potassium: 4.6 mmol/L (ref 3.5–5.1)
SODIUM: 135 mmol/L (ref 135–145)

## 2018-04-26 LAB — GLUCOSE, CAPILLARY: GLUCOSE-CAPILLARY: 280 mg/dL — AB (ref 70–99)

## 2018-04-26 NOTE — Progress Notes (Addendum)
EKG dated 03-29-2018 in epic.  A1c dated 03-29-2018 in epic under care everywhere (9.6).  Pt is followed by endocrinologist, dr Darcus Austin note dated 03-29-18, in epic.  Cardiology, dr [name], lov note in epic dated 07-30-2017.  ADDENDUM:  CBC and BMP results dated 04-26-2018 routed to dr Louis Meckel in epic.

## 2018-04-26 NOTE — Patient Instructions (Addendum)
Isaac [name]  Aug 17, 1955     Your procedure is scheduled on:   04-28-2018   Report to [facility] Main  Entrance, Report to admitting at  6:30 AM    Call this number if you have problems the morning of surgery [phone]       Remember: Do not eat food or drink liquids :After Midnight.                                       BRUSH YOUR TEETH MORNING OF SURGERY AND RINSE YOUR MOUTH OUT, NO CHEWING GUM CANDY OR MINTS.     Take these medicines the morning of surgery with A SIP OF WATER:  Isosorbide, Rosuvastatin, Metoprolol, Amlodipine, Pantoprazole  DO NOT TAKE ANY DIABETIC MEDICATIONS DAY OF YOUR SURGERY                                   You may not have any metal on your body including hair pins and               piercings  Do not wear jewelry, make-up, lotions, powders or perfumes, deodorant              Men may shave face and neck.       Do not bring valuables to the hospital. [place].  Contacts, dentures or bridgework may not be worn into surgery.  Leave suitcase in the car. After surgery it may be brought to your room.     Patients discharged the day of surgery will not be allowed to drive home.  Name and phone number of your driver:   Sister-- Gery Pray  #[phone]     Special Instructions:  BRING CPAP MASK AND TUBING WITH YOU DAY OF SURGERY               _____________________________________________________________________   How to Manage Your Diabetes Before and After Surgery  Why is it important to control my blood sugar before and after surgery? . Improving blood sugar levels before and after surgery helps healing and can limit problems. . A way of improving blood sugar control is eating a healthy diet by: o  Eating less sugar and carbohydrates o  Increasing activity/exercise o  Talking with your doctor about reaching your blood sugar goals . High blood  sugars (greater than 180 mg/dL) can raise your risk of infections and slow your recovery, so you will need to focus on controlling your diabetes during the weeks before surgery. . Make sure that the doctor who takes care of your diabetes knows about your planned surgery including the date and location.  How do I manage my blood sugar before surgery? . Check your blood sugar at least 4 times a day, starting 2 days before surgery, to make sure that the level is not too high or low. o Check your blood sugar the morning of your surgery when you wake up and every 2 hours until you get to the Short Stay unit. . If your blood sugar is less than 70 mg/dL, you will need to treat for low blood sugar: o  Do not take insulin. o Treat a low blood sugar (less than 70 mg/dL) with  cup of clear juice (cranberry or apple), 4 glucose tablets, OR glucose gel. o Recheck blood sugar in 15 minutes after treatment (to make sure it is greater than 70 mg/dL). If your blood sugar is not greater than 70 mg/dL on recheck, call [phone] for further instructions. . Report your blood sugar to the short stay nurse when you get to Short Stay.  . If you are admitted to the hospital after surgery: o Your blood sugar will be checked by the staff and you will probably be given insulin after surgery (instead of oral diabetes medicines) to make sure you have good blood sugar levels. o The goal for blood sugar control after surgery is 80-180 mg/dL.   WHAT DO I DO ABOUT MY DIABETES MEDICATION?  Marland Kitchen Do not take oral diabetes medicines (pills) the morning of surgery.  . THE NIGHT BEFORE SURGERY, take    20  units of  Toujeo     Insulin. (which is half dose)       Patient Signature:  Date:   Nurse Signature:  Date:   Reviewed and Endorsed by [facility] Patient Education Committee, August 2015          [facility] - Preparing for Surgery Before surgery, you can play an important role.  Because skin is not sterile, your skin  needs to be as free of germs as possible.  You can reduce the number of germs on your skin by washing with CHG (chlorahexidine gluconate) soap before surgery.  CHG is an antiseptic cleaner which kills germs and bonds with the skin to continue killing germs even after washing. Please DO NOT use if you have an allergy to CHG or antibacterial soaps.  If your skin becomes reddened/irritated stop using the CHG and inform your nurse when you arrive at Short Stay. Do not shave (including legs and underarms) for at least 48 hours prior to the first CHG shower.  You may shave your face/neck. Please follow these instructions carefully:  1.  Shower with CHG Soap the night before surgery and the  morning of Surgery.  2.  If you choose to wash your hair, wash your hair first as usual with your  normal  shampoo.  3.  After you shampoo, rinse your hair and body thoroughly to remove the  shampoo.                          4.  Use CHG as you would any other liquid soap.  You can apply chg directly  to the skin and wash                       Gently with a scrungie or clean washcloth.  5.  Apply the CHG Soap to your body ONLY FROM THE NECK DOWN.   Do not use on face/ open                           Wound or open sores. Avoid contact with eyes, ears mouth and genitals (private parts).                       Wash face,  Genitals (private parts) with your normal soap.             6.  Wash thoroughly, paying special attention to the area where your surgery  will be performed.  7.  Thoroughly rinse your body with warm water from the neck down.  8.  DO NOT shower/wash with your normal soap after using and rinsing off  the CHG Soap.             9.  Pat yourself dry with a clean towel.            10.  Wear clean pajamas.            11.  Place clean sheets on your bed the night of your first shower and do not  sleep with pets. Day of Surgery : Do not apply any lotions/deodorants the morning of surgery.  Please wear clean  clothes to the hospital/surgery center.  FAILURE TO FOLLOW THESE INSTRUCTIONS MAY RESULT IN THE CANCELLATION OF YOUR SURGERY PATIENT SIGNATURE_________________________________  NURSE SIGNATURE__________________________________  ________________________________________________________________________

## 2018-04-28 ENCOUNTER — Ambulatory Visit (HOSPITAL_COMMUNITY): Payer: Medicare Other | Admitting: Certified Registered"

## 2018-04-28 ENCOUNTER — Ambulatory Visit (HOSPITAL_COMMUNITY)
Admission: RE | Admit: 2018-04-28 | Discharge: 2018-04-28 | Disposition: A | Discharge status: Home / Self Care | Payer: Medicare Other | Attending: Urology | Admitting: Urology

## 2018-04-28 ENCOUNTER — Encounter (HOSPITAL_COMMUNITY)
Admission: RE | Disposition: A | Discharge status: Home / Self Care | Payer: Self-pay | Source: Home / Self Care | Attending: Urology

## 2018-04-28 ENCOUNTER — Encounter (HOSPITAL_COMMUNITY): Payer: Self-pay

## 2018-04-28 ENCOUNTER — Ambulatory Visit (HOSPITAL_COMMUNITY): Payer: Medicare Other

## 2018-04-28 DIAGNOSIS — N3090 Cystitis, unspecified without hematuria: Secondary | ICD-10-CM | POA: Diagnosis not present

## 2018-04-28 DIAGNOSIS — Z7982 Long term (current) use of aspirin: Secondary | ICD-10-CM | POA: Insufficient documentation

## 2018-04-28 DIAGNOSIS — F419 Anxiety disorder, unspecified: Secondary | ICD-10-CM | POA: Diagnosis not present

## 2018-04-28 DIAGNOSIS — E119 Type 2 diabetes mellitus without complications: Secondary | ICD-10-CM | POA: Diagnosis not present

## 2018-04-28 DIAGNOSIS — R9341 Abnormal radiologic findings on diagnostic imaging of renal pelvis, ureter, or bladder: Secondary | ICD-10-CM | POA: Diagnosis not present

## 2018-04-28 DIAGNOSIS — Z96659 Presence of unspecified artificial knee joint: Secondary | ICD-10-CM | POA: Insufficient documentation

## 2018-04-28 DIAGNOSIS — N5201 Erectile dysfunction due to arterial insufficiency: Secondary | ICD-10-CM | POA: Insufficient documentation

## 2018-04-28 DIAGNOSIS — Z8553 Personal history of malignant neoplasm of renal pelvis: Secondary | ICD-10-CM | POA: Insufficient documentation

## 2018-04-28 DIAGNOSIS — F329 Major depressive disorder, single episode, unspecified: Secondary | ICD-10-CM | POA: Insufficient documentation

## 2018-04-28 DIAGNOSIS — E7800 Pure hypercholesterolemia, unspecified: Secondary | ICD-10-CM | POA: Diagnosis not present

## 2018-04-28 DIAGNOSIS — Z79899 Other long term (current) drug therapy: Secondary | ICD-10-CM | POA: Insufficient documentation

## 2018-04-28 DIAGNOSIS — C676 Malignant neoplasm of ureteric orifice: Secondary | ICD-10-CM

## 2018-04-28 DIAGNOSIS — N3080 Other cystitis without hematuria: Secondary | ICD-10-CM | POA: Insufficient documentation

## 2018-04-28 DIAGNOSIS — Z8554 Personal history of malignant neoplasm of ureter: Secondary | ICD-10-CM | POA: Diagnosis not present

## 2018-04-28 DIAGNOSIS — Z794 Long term (current) use of insulin: Secondary | ICD-10-CM | POA: Insufficient documentation

## 2018-04-28 DIAGNOSIS — I2510 Atherosclerotic heart disease of native coronary artery without angina pectoris: Secondary | ICD-10-CM | POA: Insufficient documentation

## 2018-04-28 DIAGNOSIS — E785 Hyperlipidemia, unspecified: Secondary | ICD-10-CM | POA: Insufficient documentation

## 2018-04-28 DIAGNOSIS — N342 Other urethritis: Secondary | ICD-10-CM | POA: Diagnosis not present

## 2018-04-28 DIAGNOSIS — C678 Malignant neoplasm of overlapping sites of bladder: Secondary | ICD-10-CM | POA: Insufficient documentation

## 2018-04-28 DIAGNOSIS — Z87891 Personal history of nicotine dependence: Secondary | ICD-10-CM | POA: Insufficient documentation

## 2018-04-28 DIAGNOSIS — D494 Neoplasm of unspecified behavior of bladder: Secondary | ICD-10-CM | POA: Diagnosis not present

## 2018-04-28 DIAGNOSIS — N3289 Other specified disorders of bladder: Secondary | ICD-10-CM | POA: Diagnosis not present

## 2018-04-28 DIAGNOSIS — G4730 Sleep apnea, unspecified: Secondary | ICD-10-CM | POA: Insufficient documentation

## 2018-04-28 DIAGNOSIS — I10 Essential (primary) hypertension: Secondary | ICD-10-CM | POA: Insufficient documentation

## 2018-04-28 DIAGNOSIS — I252 Old myocardial infarction: Secondary | ICD-10-CM | POA: Insufficient documentation

## 2018-04-28 DIAGNOSIS — K219 Gastro-esophageal reflux disease without esophagitis: Secondary | ICD-10-CM | POA: Insufficient documentation

## 2018-04-28 DIAGNOSIS — N3020 Other chronic cystitis without hematuria: Secondary | ICD-10-CM | POA: Insufficient documentation

## 2018-04-28 DIAGNOSIS — C679 Malignant neoplasm of bladder, unspecified: Secondary | ICD-10-CM | POA: Diagnosis not present

## 2018-04-28 HISTORY — PX: CYSTOSCOPY WITH RETROGRADE PYELOGRAM, URETEROSCOPY AND STENT PLACEMENT: SHX5789

## 2018-04-28 HISTORY — PX: TRANSURETHRAL RESECTION OF BLADDER TUMOR: SHX2575

## 2018-04-28 LAB — GLUCOSE, CAPILLARY
GLUCOSE-CAPILLARY: 221 mg/dL — AB (ref 70–99)
Glucose-Capillary: 252 mg/dL — ABNORMAL HIGH (ref 70–99)

## 2018-04-28 SURGERY — TURBT (TRANSURETHRAL RESECTION OF BLADDER TUMOR)
Anesthesia: General

## 2018-04-28 MED ORDER — MIDAZOLAM HCL 2 MG/2ML IJ SOLN
INTRAMUSCULAR | Status: DC | PRN
  Administered 2018-04-28: 2 mg via INTRAVENOUS

## 2018-04-28 MED ORDER — FUROSEMIDE 10 MG/ML IJ SOLN: 20.0000 mg | Freq: Once | INTRAMUSCULAR | Status: DC

## 2018-04-28 MED ORDER — PHENAZOPYRIDINE HCL 200 MG PO TABS
200.0000 mg | ORAL_TABLET | Freq: Once | ORAL | Status: AC
  Administered 2018-04-28: 200 mg via ORAL

## 2018-04-28 MED ORDER — LIDOCAINE HCL URETHRAL/MUCOSAL 2 % EX GEL
CUTANEOUS | Status: AC
  Filled 2018-04-28: qty 5

## 2018-04-28 MED ORDER — BELLADONNA ALKALOIDS-OPIUM 16.2-30 MG RE SUPP
RECTAL | Status: AC
  Filled 2018-04-28: qty 1

## 2018-04-28 MED ORDER — FENTANYL CITRATE (PF) 100 MCG/2ML IJ SOLN
INTRAMUSCULAR | Status: AC
  Filled 2018-04-28: qty 2

## 2018-04-28 MED ORDER — SUGAMMADEX SODIUM 200 MG/2ML IV SOLN
INTRAVENOUS | Status: AC
  Filled 2018-04-28: qty 2

## 2018-04-28 MED ORDER — IOHEXOL 300 MG/ML  SOLN
INTRAMUSCULAR | Status: DC | PRN
  Administered 2018-04-28: 25 mL

## 2018-04-28 MED ORDER — CIPROFLOXACIN IN D5W 400 MG/200ML IV SOLN
INTRAVENOUS | Status: AC
  Filled 2018-04-28: qty 200

## 2018-04-28 MED ORDER — TRAMADOL HCL 50 MG PO TABS: 50.0000 mg | ORAL_TABLET | Freq: Four times a day (QID) | ORAL | 0 refills | Status: DC | PRN

## 2018-04-28 MED ORDER — FUROSEMIDE 10 MG/ML IJ SOLN
20.0000 mg | Freq: Once | INTRAMUSCULAR | Status: AC
  Administered 2018-04-28: 20 mg via INTRAVENOUS

## 2018-04-28 MED ORDER — SUGAMMADEX SODIUM 200 MG/2ML IV SOLN
INTRAVENOUS | Status: DC | PRN
  Administered 2018-04-28: 190 mg via INTRAVENOUS

## 2018-04-28 MED ORDER — TRAMADOL HCL 50 MG PO TABS
50.0000 mg | ORAL_TABLET | Freq: Once | ORAL | Status: AC
  Administered 2018-04-28: 50 mg via ORAL

## 2018-04-28 MED ORDER — CIPROFLOXACIN IN D5W 400 MG/200ML IV SOLN
INTRAVENOUS | Status: DC | PRN
  Administered 2018-04-28: 400 mg via INTRAVENOUS

## 2018-04-28 MED ORDER — HYDROMORPHONE HCL 1 MG/ML IJ SOLN
INTRAMUSCULAR | Status: AC
  Filled 2018-04-28: qty 1

## 2018-04-28 MED ORDER — HYDROMORPHONE HCL 1 MG/ML IJ SOLN
0.2500 mg | INTRAMUSCULAR | Status: DC | PRN
  Administered 2018-04-28: 0.5 mg via INTRAVENOUS

## 2018-04-28 MED ORDER — FENTANYL CITRATE (PF) 250 MCG/5ML IJ SOLN
INTRAMUSCULAR | Status: DC | PRN
  Administered 2018-04-28 (×5): 50 ug via INTRAVENOUS

## 2018-04-28 MED ORDER — STERILE WATER FOR IRRIGATION IR SOLN
Status: DC | PRN
  Administered 2018-04-28: 12000 mL

## 2018-04-28 MED ORDER — ONDANSETRON HCL 4 MG/2ML IJ SOLN
INTRAMUSCULAR | Status: DC | PRN
  Administered 2018-04-28: 4 mg via INTRAVENOUS

## 2018-04-28 MED ORDER — LIDOCAINE 2% (20 MG/ML) 5 ML SYRINGE
INTRAMUSCULAR | Status: DC | PRN
  Administered 2018-04-28: 80 mg via INTRAVENOUS

## 2018-04-28 MED ORDER — PROPOFOL 10 MG/ML IV BOLUS
INTRAVENOUS | Status: DC | PRN
  Administered 2018-04-28: 200 mg via INTRAVENOUS

## 2018-04-28 MED ORDER — 0.9 % SODIUM CHLORIDE (POUR BTL) OPTIME
TOPICAL | Status: DC | PRN
  Administered 2018-04-28: 1000 mL

## 2018-04-28 MED ORDER — TRAMADOL HCL 50 MG PO TABS
ORAL_TABLET | ORAL | Status: AC
  Filled 2018-04-28: qty 1

## 2018-04-28 MED ORDER — FUROSEMIDE 10 MG/ML IJ SOLN
INTRAMUSCULAR | Status: AC
  Filled 2018-04-28: qty 2

## 2018-04-28 MED ORDER — ROCURONIUM BROMIDE 10 MG/ML (PF) SYRINGE
PREFILLED_SYRINGE | INTRAVENOUS | Status: DC | PRN
  Administered 2018-04-28: 5 mg via INTRAVENOUS
  Administered 2018-04-28: 50 mg via INTRAVENOUS

## 2018-04-28 MED ORDER — PROMETHAZINE HCL 25 MG/ML IJ SOLN
6.2500 mg | INTRAMUSCULAR | Status: DC | PRN
  Administered 2018-04-28: 12.5 mg via INTRAVENOUS

## 2018-04-28 MED ORDER — DIAZEPAM 10 MG PO TABS: 10.0000 mg | ORAL_TABLET | Freq: Every evening | ORAL | 0 refills | Status: DC | PRN

## 2018-04-28 MED ORDER — BELLADONNA ALKALOIDS-OPIUM 16.2-60 MG RE SUPP
RECTAL | Status: DC | PRN
  Administered 2018-04-28: 1 via RECTAL

## 2018-04-28 MED ORDER — SUGAMMADEX SODIUM 500 MG/5ML IV SOLN
INTRAVENOUS | Status: AC
  Filled 2018-04-28: qty 5

## 2018-04-28 MED ORDER — SODIUM CHLORIDE 0.9 % IR SOLN
Status: DC | PRN
  Administered 2018-04-28: 9000 mL via INTRAVESICAL

## 2018-04-28 MED ORDER — PHENAZOPYRIDINE HCL 200 MG PO TABS
ORAL_TABLET | ORAL | Status: AC
  Filled 2018-04-28: qty 1

## 2018-04-28 MED ORDER — PROMETHAZINE HCL 25 MG/ML IJ SOLN
INTRAMUSCULAR | Status: AC
  Filled 2018-04-28: qty 1

## 2018-04-28 MED ORDER — LIDOCAINE HCL URETHRAL/MUCOSAL 2 % EX GEL
CUTANEOUS | Status: DC | PRN
  Administered 2018-04-28: 1 via URETHRAL

## 2018-04-28 MED ORDER — MIDAZOLAM HCL 2 MG/2ML IJ SOLN
INTRAMUSCULAR | Status: AC
  Filled 2018-04-28: qty 2

## 2018-04-28 MED ORDER — LIDOCAINE 2% (20 MG/ML) 5 ML SYRINGE
INTRAMUSCULAR | Status: AC
  Filled 2018-04-28: qty 5

## 2018-04-28 MED ORDER — ROCURONIUM BROMIDE 10 MG/ML (PF) SYRINGE
PREFILLED_SYRINGE | INTRAVENOUS | Status: AC
  Filled 2018-04-28: qty 10

## 2018-04-28 MED ORDER — PHENAZOPYRIDINE HCL 200 MG PO TABS: 200.0000 mg | ORAL_TABLET | Freq: Three times a day (TID) | ORAL | 0 refills | Status: DC | PRN

## 2018-04-28 MED ORDER — PROPOFOL 10 MG/ML IV BOLUS
INTRAVENOUS | Status: AC
  Filled 2018-04-28: qty 20

## 2018-04-28 MED ORDER — LACTATED RINGERS IV SOLN
INTRAVENOUS | Status: DC
  Administered 2018-04-28: 1000 mL via INTRAVENOUS

## 2018-04-28 SURGICAL SUPPLY — 29 items
BAG URINE DRAINAGE (UROLOGICAL SUPPLIES) ×3 IMPLANT
BAG URO CATCHER STRL LF (MISCELLANEOUS) ×3 IMPLANT
BASKET ZERO TIP NITINOL 2.4FR (BASKET) IMPLANT
CATH FOLEY 2WAY SLVR 30CC 24FR (CATHETERS) IMPLANT
CATH FOLEY 3WAY 30CC 22FR (CATHETERS) ×3 IMPLANT
CATH URET 5FR 28IN OPEN ENDED (CATHETERS) ×3 IMPLANT
CLOTH BEACON ORANGE TIMEOUT ST (SAFETY) ×3 IMPLANT
COVER WAND RF STERILE (DRAPES) IMPLANT
ELECT REM PT RETURN 15FT ADLT (MISCELLANEOUS) IMPLANT
EVACUATOR MICROVAS BLADDER (UROLOGICAL SUPPLIES) IMPLANT
EXTRACTOR STONE 1.7FRX115CM (UROLOGICAL SUPPLIES) IMPLANT
GLOVE BIOGEL M 8.0 STRL (GLOVE) ×3 IMPLANT
GLOVE BIOGEL M STRL SZ7.5 (GLOVE) ×3 IMPLANT
GOWN STRL REUS W/TWL XL LVL3 (GOWN DISPOSABLE) ×3 IMPLANT
GUIDEWIRE ANG ZIPWIRE 038X150 (WIRE) IMPLANT
GUIDEWIRE STR DUAL SENSOR (WIRE) ×3 IMPLANT
LOOP CUT BIPOLAR 24F LRG (ELECTROSURGICAL) ×3 IMPLANT
MANIFOLD NEPTUNE II (INSTRUMENTS) ×3 IMPLANT
NDL SAFETY ECLIPSE 18X1.5 (NEEDLE) IMPLANT
NEEDLE HYPO 18GX1.5 SHARP (NEEDLE)
PACK CYSTO (CUSTOM PROCEDURE TRAY) ×3 IMPLANT
SET ASPIRATION TUBING (TUBING) IMPLANT
SHEATH URETERAL 12FRX28CM (UROLOGICAL SUPPLIES) IMPLANT
SHEATH URETERAL 12FRX35CM (MISCELLANEOUS) IMPLANT
SYRINGE 35CC LL (MISCELLANEOUS) ×3 IMPLANT
SYRINGE IRR TOOMEY STRL 70CC (SYRINGE) IMPLANT
TUBING CONNECTING 10 (TUBING) ×3 IMPLANT
TUBING UROLOGY SET (TUBING) ×3 IMPLANT
WIRE COONS/BENSON .038X145CM (WIRE) IMPLANT

## 2018-04-28 NOTE — Anesthesia Preprocedure Evaluation (Addendum)
Anesthesia Evaluation  Patient identified by MRN, date of birth, ID band Patient awake    Reviewed: Allergy & Precautions, NPO status , Patient's Chart, lab work & pertinent test results, reviewed documented beta blocker date and time   Airway Mallampati: III  TM Distance: >3 FB Neck ROM: Full    Dental no notable dental hx.    Pulmonary sleep apnea and Continuous Positive Airway Pressure Ventilation , former smoker,    Pulmonary exam normal breath sounds clear to auscultation       Cardiovascular hypertension, Pt. on medications and Pt. on home beta blockers + CAD and + Past MI  Normal cardiovascular exam Rhythm:Regular Rate:Normal  Coronary vasospasm  Sees cardiologist Harrington Challenger)   Neuro/Psych PSYCHIATRIC DISORDERS Anxiety Depression negative neurological ROS     GI/Hepatic Neg liver ROS, GERD  Medicated and Controlled,  Endo/Other  diabetes, Insulin Dependent  Renal/GU Renal disease     Musculoskeletal negative musculoskeletal ROS (+)   Abdominal   Peds  Hematology  (+) anemia , HLD   Anesthesia Other Findings BLADDER CANCER  Reproductive/Obstetrics                            Anesthesia Physical Anesthesia Plan  ASA: III  Anesthesia Plan: General   Post-op Pain Management:    Induction: Intravenous  PONV Risk Score and Plan: 3 and Ondansetron, Dexamethasone, Midazolam and Treatment may vary due to age or medical condition  Airway Management Planned: Oral ETT  Additional Equipment:   Intra-op Plan:   Post-operative Plan: Extubation in OR  Informed Consent: I have reviewed the patients History and Physical, chart, labs and discussed the procedure including the risks, benefits and alternatives for the proposed anesthesia with the patient or authorized representative who has indicated his/her understanding and acceptance.   Dental advisory given  Plan Discussed with:  CRNA  Anesthesia Plan Comments:         Anesthesia Quick Evaluation

## 2018-04-28 NOTE — Discharge Instructions (Signed)

## 2018-04-28 NOTE — Anesthesia Postprocedure Evaluation (Signed)
Anesthesia Post Note  Patient: Isaac [name]  Procedure(s) Performed: TRANSURETHRAL RESECTION OF BLADDER TUMOR (TURBT) (N/A ) LEFT RETROGRADE PYELOGRAM, DIAGNOSTIC LEFT URETEROSCOPY (Left )     Patient location during evaluation: PACU Anesthesia Type: General Level of consciousness: awake and alert Pain management: pain level controlled Vital Signs Assessment: post-procedure vital signs reviewed and stable Respiratory status: spontaneous breathing, nonlabored ventilation, respiratory function stable and patient connected to nasal cannula oxygen Cardiovascular status: blood pressure returned to baseline and stable Postop Assessment: no apparent nausea or vomiting Anesthetic complications: no    Last Vitals:  Vitals:   04/28/18 1245 04/28/18 1400  BP: 124/78 130/78  Pulse: 65 65  Resp: 15 15  Temp: 36.6 C 36.6 C  SpO2: 94% 95%    Last Pain:  Vitals:   04/28/18 1200  TempSrc:   PainSc: 5                  Nissi Doffing P Jaelynn Currier

## 2018-04-28 NOTE — H&P (Signed)
Follow-up for recent surgery  HPI: Isaac [name] is a 62 year-old male established patient who is here for a post-op visit following recent surgery.  The patient is s/p TURBT. His surgery was done 03/25/2018.   He has not had post operative fever. The patient is not having incisional pain. He no longer require narcotic pain medication for their pain. bowels are moving normally.   Patient states that after voiding he has "spasms". He has been experiencing urgency over past couple days. The patient's symptoms have started to worsen over the last several days. This is initially after improving.   Patient's pathology demonstrated high-grade non muscle invasive bladder cancer     ALLERGIES: No Allergies    MEDICATIONS: Aspirin Low Dose TABS 1 Oral  Crestor 10 mg tablet 0 Oral  Cymbalta  Diazepam 10 mg tablet 2 tablet PO before procedure  Glimepiride 2 mg tablet 0 Oral  GlyBURIDE 5 MG Oral Tablet 1 Oral Daily  Hydrocodone-Acetaminophen 5 mg-325 mg tablet 1 tablet PO Q 6 H PRN pain  Isosorbide Mononitrate Er 120 mg tablet, extended release 24 hr 1 Oral Daily  Lantus 100 unit/ml vial 0 Subcutaneous  Norvasc 5 mg tablet 2 Oral  Pantoprazole Sodium 40 mg tablet, delayed release 0 Oral  Ropinirole Hcl 1 mg tablet 0 Oral  Sildenafil Citrate 20 mg tablet 1-2 tablet PO Daily  Toprol Xl 25 mg tablet, extended release 24 hr 1 Oral Daily     GU PSH: Bladder Instill AntiCA Agent - 2013, 2012 Cysto Bladder Ureth Biopsy - 2010, 2009, 2008 Cysto Uretero Biopsy Fulgura, Right - 01/21/2017, 2010, 2009 Cystoscopy - 03/08/2018, 12/17/2016, 2018, 2017 Cystoscopy Insert Stent, Right - 01/21/2017, 2010, 2009 Cystoscopy TURBT <2 cm - 2013, 2012, 2012 Cystoscopy TURBT 2-5 cm - 03/25/2018, 2013 Cystoscopy Ureteroscopy - 2012, 2012, 2010, 2010 Inject For cystogram - 02/23/2017 Lap Nephro Ureterectomy, Right - 02/12/2017 Locm 300-399Mg /Ml Iodine,1Ml - 12/30/2016, 2017 Reimplant Hitch or Flap - 2010      [place]  Notes: Knee Replacement, Knee Arthroscopy, Bladder Injection Of Cancer Treatment, Cystoscopy With Fulguration Medium Lesion (2-5cm), Cystoscopy With Fulguration Small Lesion (5-77mm), Bladder Injection Of Cancer Treatment, Cystoscopy With Ureteroscopy Right, Cystoscopy With Fulguration Small Lesion (5-44mm), Cystoscopy With Fulguration Small Lesion (5-86mm), Cystoscopy With Ureteroscopy Right, Cystoscopy With Ureteroscopy Right, Ureteroneocystostomy With Bladder Flap, Cystoscopy With Insertion Of Ureteral Stent Right, Cystoscopy With Ureteroscopy For Biopsy Right, Cystoscopy With Ureteroscopy Right, Cystoscopy With Biopsy, Sinus Surgery, Cystoscopy With Ureteroscopy With Fulguration Of Lesion, Cystoscopy With Insertion Of Ureteral Stent Right, Cystoscopy With Biopsy, Cystoscopy With Biopsy, Shoulder Surgery, Elbow Surgery, Bladder Surgery, Appendectomy, Inguinal Hernia Repair   NON-GU PSH: Appendectomy - 2008 Revise Knee Joint - 2017    GU PMH: Bladder Cancer overlapping sites - 03/08/2018 Renal pelvis cancer, 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, - 02/03/2017 Flank Pain - 02/05/2017 Gross hematuria - 11/17/2016, - 2017 Weak Urinary Stream - 2018 Acute Cystitis/UTI (Improving) - 2017, - 2017, Acute cystitis without hematuria, - 2017 BPH w/LUTS - 2017 History of bladder cancer, History of bladder cancer - 2017, History of bladder cancer, - 2016, History of malignant neoplasm of bladder, - 2015 BPH w/o LUTS, Enlarged prostate without lower urinary tract symptoms (luts) - 2017 ED due to arterial insufficiency, Erectile dysfunction due to arterial insufficiency - 2016 Bladder Cancer, Unspec, Bladder cancer - 2014      PMH Notes:  Past Gu Hx:    [name] returns for follow-up. 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 Prior to that, Dr. 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 He is status post right ureteral resection with reimplantation due to recurrent transitional cell carcinoma of the bladder and involvement of the distal right ureter (June 2010). Again, we felt nephroureterectomy would be overaggressive therapy. The patient had reassessment (02/2009). This included a cystogram which showed nice reflux up the right side with no evidence of stricturing, no evidence of filling defects. We were able to drive the ureteroscope right up the ureter as well and has had no evidence of recurrence. Urine cytology was unremarkable and there was no evidence of any bladder cancer. Overall we were quite pleased with things and he has recovered from that procedure very nicely. He continues to do well. Follow up 06/2009 including cystoscopy and NMP-22 was negative.    Follow up 10/2009 cystoscopy and cytology were negative.   Follow up 02/2010 cystoscopy and cytology were negative CT scan also ok.    [name] status post his most recent cystoscopy with bladder biopsy. Since my distal ureterectomy with reimplant, he has had no evidence of recurrences until his follow-up in February of 2012 where we saw some carpeting of papillary tumor. The patient recently underwent cold-cup biopsy/resection of these areas along with fulguration. Pathology did indeed confirm this to be transitional cell carcinoma, but fortunately it was low-grade and noninvasive. He has had 2 induction courses of BCG and did not really tolerate the last one that well.    [name] was noted to have several small bladder tumor recurrences in November of 2012. The patient had papillary tumors noted. These were noninvasive, felt to be low grade on the trigone. The posterior wall tumor looked mostly low grade with some small little foci of potentially higher grade tumor, but again, no evidence of invasion on either lesion. The patient did have installation of mitomycin.    His most recent recurrence was in his bladder  in November of 2013. He had a solitary tumor at the dome of his bladder. Pathology revealed this to be low-grade and noninvasive.    [name] did undergo retrograde pyelography as well as cystogram to look at the upper tracts back in December 2013 and there was no evidence of filling defect or ureteral obstruction.    April of 2014. At that time cystoscopy was negative and NMP 22 testing was also negative.   August 2014: Negative cystoscopy   December 2014: Negative cystoscopy and NMP-22 testing.   June 2015: Negative cystoscopy. Positive atypia on urine cytology with negative FISH testing   January 2016: Negative cystoscopy and negative upper tract imaging by hematuria protocol CT scan      NON-GU PMH: Encounter for general adult medical examination without abnormal findings, Encounter for preventive health examination - August 26, 2013 Personal history of other diseases of the circulatory system, History of hypertension - 2012/08/26 Personal history of other diseases of the digestive system, History of esophageal reflux - 26-Aug-2012 Personal history of other diseases of the nervous system and sense organs, History of sleep apnea - Aug 26, 2012 Personal history of other endocrine, nutritional and metabolic disease, History of hypercholesterolemia - August 26, 2012, History of diabetes mellitus, - 08-26-12 Personal history of other mental and behavioral disorders, History of depression - 26-Aug-2012    FAMILY HISTORY: Death In The Family Father - Father Death In The Family Mother - Mother Diabetes - Grandmother   SOCIAL  HISTORY: Marital Status: Single Preferred Language: English; Ethnicity: Not Hispanic Or Latino; Race: White Current Smoking Status: Patient does not smoke anymore.      Notes: Former smoker, Marital History - Single, Tobacco Use, Caffeine Use, Alcohol Use   REVIEW OF SYSTEMS:    GU Review Male:   Patient reports frequent urination, hard to postpone urination, and stream starts and stops. Patient denies burning/ pain  with urination, get up at night to urinate, leakage of urine, trouble starting your stream, have to strain to urinate , erection problems, and penile pain.  Gastrointestinal (Upper):   Patient denies nausea, vomiting, and indigestion/ heartburn.  Gastrointestinal (Lower):   Patient reports diarrhea. Patient denies constipation.  Constitutional:   Patient denies fever, night sweats, weight loss, and fatigue.  Skin:   Patient denies skin rash/ lesion and itching.  Eyes:   Patient denies blurred vision and double vision.  Ears/ Nose/ Throat:   Patient denies sore throat and sinus problems.  Hematologic/Lymphatic:   Patient denies swollen glands and easy bruising.  Cardiovascular:   Patient denies leg swelling and chest pains.  Respiratory:   Patient denies cough and shortness of breath.  Endocrine:   Patient denies excessive thirst.  Musculoskeletal:   Patient denies back pain and joint pain.  Neurological:   Patient denies headaches and dizziness.  Psychologic:   Patient denies depression and anxiety.   VITAL SIGNS:      04/12/2018 01:51 PM  Weight 200 lb / 90.72 kg  BP 130/75 mmHg  Pulse 90 /min  Temperature 98.5 F / 36.9 C   PAST DATA REVIEWED:  Source Of History:  Patient  Records Review:   Pathology Reports, Previous Doctor Records, Previous Patient Records, POC Tool   11/17/14 03/30/12 08/28/08 05/20/06  PSA  Total PSA 0.58  0.44  0.35  1.13     06/04/05  Hormones  Testosterone, Total 2.68     PROCEDURES:          Urinalysis w/Scope - 81001 Dipstick Dipstick Cont'd Micro  Color: Yellow Bilirubin: Neg WBC/hpf: 40 - 60/hpf  Appearance: Cloudy Ketones: Neg RBC/hpf: 20 - 40/hpf  Specific Gravity: 1.025 Blood: 3+ Bacteria: Mod (26-50/hpf)  pH: 5.5 Protein: 2+ Cystals: NS (Not Seen)  Glucose: 3+ Urobilinogen: 0.2 Casts: NS (Not Seen)    Nitrites: Neg Trichomonas: Not Present    Leukocyte Esterase: 2+ Mucous: Present      Epithelial Cells: NS (Not Seen)      Yeast: NS (Not  Seen)      Sperm: Not Present    Notes:      ASSESSMENT:      ICD-10 Details  1 GU:   Bladder Cancer overlapping sites - C67.8      PLAN:            Medications New Meds: Bactrim Ds 800 mg-160 mg tablet 1 tablet PO BID   #14  0 Refill(s)            Orders Labs Urine Culture          Document Letter(s):  Created for Patient: Clinical Summary         Notes:   The patient has high-grade non muscle invasive bladder cancer, recurrence. I recommended that we have returned back to the operating room for re-resection of the areas of concern. I would also like to re-evaluate his left ureteral orifice and possibly perform left diagnostic ureteroscopy given his history. I discussed the rationale of this with him  in detail and he agreed to proceed.   The patient also likely has a urinary tract infection based on his symptoms as well as his urinalysis today. I have recommended that we start him on Bactrim today. Will follow up the cultures to ensure that he is on appropriate antibiotics.

## 2018-04-28 NOTE — Transfer of Care (Signed)
Immediate Anesthesia Transfer of Care Note  Patient: Isaac [name]  Procedure(s) Performed: TRANSURETHRAL RESECTION OF BLADDER TUMOR (TURBT) (N/A ) LEFT RETROGRADE PYELOGRAM, DIAGNOSTIC LEFT URETEROSCOPY (Left )  Patient Location: PACU  Anesthesia Type:General  Level of Consciousness: awake, alert  and oriented  Airway & Oxygen Therapy: Patient Spontanous Breathing and Patient connected to face mask oxygen  Post-op Assessment: Report given to RN and Post -op Vital signs reviewed and stable  Post vital signs: Reviewed and stable  Last Vitals:  Vitals Value Taken Time  BP    Temp    Pulse 73 04/28/2018 10:46 AM  Resp 23 04/28/2018 10:46 AM  SpO2 99 % 04/28/2018 10:46 AM  Vitals shown include unvalidated device data.  Last Pain:  Vitals:   04/28/18 0650  TempSrc:   PainSc: 2       Patients Stated Pain Goal: 4 (03/83/33 8329)  Complications: No apparent anesthesia complications

## 2018-04-28 NOTE — Anesthesia Procedure Notes (Signed)
Procedure Name: Intubation Date/Time: 04/28/2018 8:50 AM Performed by: Niel Hummer, CRNA Pre-anesthesia Checklist: Patient being monitored, Suction available, Emergency Drugs available and Patient identified Patient Re-evaluated:Patient Re-evaluated prior to induction Oxygen Delivery Method: Circle system utilized Preoxygenation: Pre-oxygenation with 100% oxygen Induction Type: IV induction Ventilation: Two handed mask ventilation required and Oral airway inserted - appropriate to patient size Laryngoscope Size: Mac and 4 Grade View: Grade I Tube type: Oral Tube size: 7.5 mm Number of attempts: 1 Airway Equipment and Method: Stylet Placement Confirmation: ETT inserted through vocal cords under direct vision,  positive ETCO2 and breath sounds checked- equal and bilateral Secured at: 22 cm Tube secured with: Tape Dental Injury: Teeth and Oropharynx as per pre-operative assessment

## 2018-04-28 NOTE — Op Note (Signed)
Preoperative diagnosis:  1. High Grade Bladder cancer   Postoperative diagnosis:  1. same   Procedure: 1. Grade resection of bladder tumor, 2 cm in size on the left trigone, and 2 cm in size on the right bladder dome/posterior wall 2. Left retrograde pyelogram with interpretation 3. Left diagnostic ureteroscopy 4. Urethral biopsy  Surgeon: [name], MD  Anesthesia: General  Complications: None  Intraoperative findings:  #1: There was a papillary lesion on the bulbar urethra which was biopsied using there rigid biopsy forceps. 2.:  The retrograde pyelogram was performed using 10 cc of Omnipaque contrast through a 5 [place] open-ended catheter and demonstrated poor filling in the mid ureter.  There was no significant hydroureteronephrosis or filling defects within the kidney itself.  Given the poor filling in the mid ureter I opted to proceed with diagnostic ureteroscopy. 3.:  Left rigid ureteroscopy demonstrated no significant abnormalities along the mucosa of the left ureter all the way up to the UPJ. 4.:  The areas along the left trigone and right posterior wall were re-resected.  There was substantial scar tissue and necrotic tissue, there was no evidence of any additional papillary lesions. 5.:  The left ureteral orifice had previously been resected and was significantly further posterior than the native ureteral orifice.  EBL: Minimal  Specimens: #1: Urethral biopsy, bulbar urethra #2: Left trigone bladder tumor reresection #3: Left ureteral orifice cold cup biopsy #4: Right posterior/bladder dome tumor reresection  Indication: Isaac [name] is a 62 y.o. patient with high-grade non-muscle invasive bladder cancer, who presents today for reresection of these areas as part of the staging and reevaluation of his high-grade recurrent bladder cancer.  After reviewing the management options for treatment, he elected to proceed with the above surgical procedure(s). We have  discussed the potential benefits and risks of the procedure, side effects of the proposed treatment, the likelihood of the patient achieving the goals of the procedure, and any potential problems that might occur during the procedure or recuperation. Informed consent has been obtained.  Description of procedure:  The patient was taken to the operating room and general anesthesia was induced.  The patient was placed in the dorsal lithotomy position, prepped and draped in the usual sterile fashion, and preoperative antibiotics were administered. A preoperative time-out was performed.   21 French 30 degrees cystoscope was gently passed to the patient's urethra and the bladder under visual guidance.  360 degrees cystoscopic evaluation was then performed with the above findings.  I then cannulated the patient's neo-ureteral meatus on the left with a sensor wire and then ultimately advanced a 5 [place] open-ended catheter over the sensor wire.  I performed a retrograde pyelogram with the above findings.  I then repassed the wire through the open-ended catheter and remove the open-ended.  I then removed the cystoscope and exchanged for the semirigid ureteroscope and performed a diagnostic ureteroscopy on the left ureter as described above.  I then introduced the resectoscope sheath with the visual obturator and remove the obturator and exchanged for the loop element.  I then re-resected the left trigone.  Hemostasis was then did diligently achieved.  I then exchanged the loop for the cold cup biopsy forceps and biopsied the left ureteral orifice.  This was sent as a separate specimen.  I then pulled back into the patient's urethra and biopsy the papillary lesion in the bulbar urethra.  I then passed the long resectoscope using the obturator into the patient's bladder and proceeded to  re-resect the right posterior wall/bladder dome of the patient's bladder.  I then worked towards hemostasis and this portion.  All  these bladder specimens were sent separately, in total there were 4 separate specimens as described above.  Once hemostasis appeared to be adequate I remove the resectoscope and passed a 22 French Foley catheter, three-way.  Irrigating the catheter the effluent was light red, and as such I opted not to place the patient on CBI.  A B&O suppository was placed in his rectum.  He was subsequently extubated and returned the PACU in stable condition.  [name], M.D.

## 2018-04-28 NOTE — Interval H&P Note (Signed)
History and Physical Interval Note:  04/28/2018 7:16 AM  Isaac [name]  has presented today for surgery, with the diagnosis of BLADDER CANCER  The various methods of treatment have been discussed with the patient and family. After consideration of risks, benefits and other options for treatment, the patient has consented to  Procedure(s): TRANSURETHRAL RESECTION OF BLADDER TUMOR (TURBT) (N/A) LEFT RETROGRADE PYELOGRAM, POSSIBLE LEFT URETEROSCOPY (Left) as a surgical intervention .  The patient's history has been reviewed, patient examined, no change in status, stable for surgery.  I have reviewed the patient's chart and labs.  Questions were answered to the patient's satisfaction.     [name]

## 2018-04-29 ENCOUNTER — Encounter (HOSPITAL_COMMUNITY): Payer: Self-pay | Admitting: Urology

## 2018-04-30 ENCOUNTER — Encounter: Payer: Self-pay | Admitting: Gastroenterology

## 2018-05-11 ENCOUNTER — Telehealth: Payer: Self-pay | Admitting: Internal Medicine

## 2018-05-11 MED ORDER — ROSUVASTATIN CALCIUM 10 MG PO TABS: ORAL_TABLET | ORAL | 0 refills | Status: DC

## 2018-05-11 NOTE — Telephone Encounter (Signed)
New message     *STAT* If patient is at the pharmacy, call can be transferred to refill team.   1. Which medications need to be refilled? (please list name of each medication and dose if known)   rosuvastatin (CRESTOR) 10 MG tablet TAKE 1 TAB (10 MG) BY MOUTH EVERY OTHER DAY Patient taking differently: Take 10 mg by mouth every other day. In the morning.     2. Which pharmacy/location (including street and city if local pharmacy) is medication to be sent to? [facility]  Mail order   3. Do they need a 30 day or 90 day supply? [place]

## 2018-05-19 ENCOUNTER — Inpatient Hospital Stay: Payer: Medicare Other | Attending: Hematology & Oncology

## 2018-05-19 ENCOUNTER — Other Ambulatory Visit: Payer: Self-pay

## 2018-05-19 VITALS — BP 114/69 | HR 75 | Temp 98.5°F | Resp 16

## 2018-05-19 DIAGNOSIS — Z452 Encounter for adjustment and management of vascular access device: Secondary | ICD-10-CM | POA: Insufficient documentation

## 2018-05-19 DIAGNOSIS — F419 Anxiety disorder, unspecified: Secondary | ICD-10-CM

## 2018-05-19 DIAGNOSIS — Z8553 Personal history of malignant neoplasm of renal pelvis: Secondary | ICD-10-CM | POA: Insufficient documentation

## 2018-05-19 DIAGNOSIS — R112 Nausea with vomiting, unspecified: Secondary | ICD-10-CM

## 2018-05-19 DIAGNOSIS — Z95828 Presence of other vascular implants and grafts: Secondary | ICD-10-CM

## 2018-05-19 MED ORDER — HEPARIN SOD (PORK) LOCK FLUSH 100 UNIT/ML IV SOLN
500.0000 [IU] | Freq: Once | INTRAVENOUS | Status: AC
  Administered 2018-05-19: 500 [IU] via INTRAVENOUS
  Filled 2018-05-19: qty 5

## 2018-05-19 MED ORDER — ALPRAZOLAM 1 MG PO TABS: 1.0000 mg | ORAL_TABLET | Freq: Three times a day (TID) | ORAL | 0 refills | Status: DC | PRN

## 2018-05-19 MED ORDER — SODIUM CHLORIDE 0.9% FLUSH
10.0000 mL | INTRAVENOUS | Status: DC | PRN
  Administered 2018-05-19: 10 mL via INTRAVENOUS
  Filled 2018-05-19: qty 10

## 2018-05-19 MED FILL — ALPRAZolam 1 MG TABS: 1 | 30 days supply | Qty: 90 | Fill #0

## 2018-05-19 NOTE — Patient Instructions (Signed)

## 2018-05-21 DIAGNOSIS — Z0120 Encounter for dental examination and cleaning without abnormal findings: Secondary | ICD-10-CM | POA: Diagnosis not present

## 2018-05-24 DIAGNOSIS — R3912 Poor urinary stream: Secondary | ICD-10-CM | POA: Diagnosis not present

## 2018-06-01 DIAGNOSIS — C678 Malignant neoplasm of overlapping sites of bladder: Secondary | ICD-10-CM | POA: Diagnosis not present

## 2018-06-09 DIAGNOSIS — C678 Malignant neoplasm of overlapping sites of bladder: Secondary | ICD-10-CM | POA: Diagnosis not present

## 2018-06-09 DIAGNOSIS — Z5111 Encounter for antineoplastic chemotherapy: Secondary | ICD-10-CM | POA: Diagnosis not present

## 2018-06-16 ENCOUNTER — Emergency Department (HOSPITAL_COMMUNITY): Payer: Medicare Other

## 2018-06-16 ENCOUNTER — Emergency Department (HOSPITAL_COMMUNITY)
Admission: EM | Admit: 2018-06-16 | Discharge: 2018-06-16 | Disposition: A | Discharge status: Home / Self Care | Payer: Medicare Other | Attending: Emergency Medicine | Admitting: Emergency Medicine

## 2018-06-16 ENCOUNTER — Other Ambulatory Visit: Payer: Self-pay

## 2018-06-16 ENCOUNTER — Encounter (HOSPITAL_COMMUNITY): Payer: Self-pay | Admitting: Emergency Medicine

## 2018-06-16 ENCOUNTER — Ambulatory Visit (HOSPITAL_COMMUNITY): Payer: Medicare Other

## 2018-06-16 DIAGNOSIS — E119 Type 2 diabetes mellitus without complications: Secondary | ICD-10-CM | POA: Diagnosis not present

## 2018-06-16 DIAGNOSIS — R079 Chest pain, unspecified: Secondary | ICD-10-CM | POA: Diagnosis not present

## 2018-06-16 DIAGNOSIS — R42 Dizziness and giddiness: Secondary | ICD-10-CM

## 2018-06-16 DIAGNOSIS — I25119 Atherosclerotic heart disease of native coronary artery with unspecified angina pectoris: Secondary | ICD-10-CM

## 2018-06-16 DIAGNOSIS — Z7982 Long term (current) use of aspirin: Secondary | ICD-10-CM | POA: Diagnosis not present

## 2018-06-16 DIAGNOSIS — N183 Chronic kidney disease, stage 3 (moderate): Secondary | ICD-10-CM | POA: Diagnosis not present

## 2018-06-16 DIAGNOSIS — I129 Hypertensive chronic kidney disease with stage 1 through stage 4 chronic kidney disease, or unspecified chronic kidney disease: Secondary | ICD-10-CM | POA: Diagnosis not present

## 2018-06-16 DIAGNOSIS — Z794 Long term (current) use of insulin: Secondary | ICD-10-CM | POA: Insufficient documentation

## 2018-06-16 DIAGNOSIS — J101 Influenza due to other identified influenza virus with other respiratory manifestations: Secondary | ICD-10-CM | POA: Diagnosis not present

## 2018-06-16 DIAGNOSIS — R55 Syncope and collapse: Secondary | ICD-10-CM

## 2018-06-16 DIAGNOSIS — J111 Influenza due to unidentified influenza virus with other respiratory manifestations: Secondary | ICD-10-CM

## 2018-06-16 DIAGNOSIS — Z79899 Other long term (current) drug therapy: Secondary | ICD-10-CM | POA: Insufficient documentation

## 2018-06-16 DIAGNOSIS — R9431 Abnormal electrocardiogram [ECG] [EKG]: Secondary | ICD-10-CM | POA: Diagnosis not present

## 2018-06-16 DIAGNOSIS — N184 Chronic kidney disease, stage 4 (severe): Secondary | ICD-10-CM | POA: Diagnosis not present

## 2018-06-16 LAB — I-STAT TROPONIN, ED
Troponin i, poc: 0.03 ng/mL (ref 0.00–0.08)
Troponin i, poc: 0.04 ng/mL (ref 0.00–0.08)

## 2018-06-16 LAB — BASIC METABOLIC PANEL
Anion gap: 11 (ref 5–15)
BUN: 22 mg/dL (ref 8–23)
CO2: 24 mmol/L (ref 22–32)
Calcium: 8.6 mg/dL — ABNORMAL LOW (ref 8.9–10.3)
Chloride: 96 mmol/L — ABNORMAL LOW (ref 98–111)
Creatinine, Ser: 2.3 mg/dL — ABNORMAL HIGH (ref 0.61–1.24)
GFR calc Af Amer: 34 mL/min — ABNORMAL LOW (ref >60)
GFR calc non Af Amer: 29 mL/min — ABNORMAL LOW (ref >60)
Glucose, Bld: 180 mg/dL — ABNORMAL HIGH (ref 70–99)
POTASSIUM: 4.3 mmol/L (ref 3.5–5.1)
Sodium: 131 mmol/L — ABNORMAL LOW (ref 135–145)

## 2018-06-16 LAB — CBC WITH DIFFERENTIAL/PLATELET
Abs Immature Granulocytes: 0.02 10*3/uL (ref 0.00–0.07)
BASOS ABS: 0 10*3/uL (ref 0.0–0.1)
Basophils Relative: 1 %
Eosinophils Absolute: 0 10*3/uL (ref 0.0–0.5)
Eosinophils Relative: 0 %
HCT: 38.4 % — ABNORMAL LOW (ref 39.0–52.0)
Hemoglobin: 12.3 g/dL — ABNORMAL LOW (ref 13.0–17.0)
Immature Granulocytes: 1 %
Lymphocytes Relative: 22 %
Lymphs Abs: 0.8 10*3/uL (ref 0.7–4.0)
MCH: 30.6 pg (ref 26.0–34.0)
MCHC: 32 g/dL (ref 30.0–36.0)
MCV: 95.5 fL (ref 80.0–100.0)
Monocytes Absolute: 0.5 10*3/uL (ref 0.1–1.0)
Monocytes Relative: 14 %
NEUTROS ABS: 2.4 10*3/uL (ref 1.7–7.7)
Neutrophils Relative %: 62 %
Platelets: 96 10*3/uL — ABNORMAL LOW (ref 150–400)
RBC: 4.02 MIL/uL — ABNORMAL LOW (ref 4.22–5.81)
RDW: 12.3 % (ref 11.5–15.5)
WBC: 3.8 10*3/uL — ABNORMAL LOW (ref 4.0–10.5)
nRBC: 0 % (ref 0.0–0.2)

## 2018-06-16 MED ORDER — ASPIRIN 81 MG PO CHEW
324.0000 mg | CHEWABLE_TABLET | Freq: Once | ORAL | Status: AC
  Administered 2018-06-16: 324 mg via ORAL
  Filled 2018-06-16: qty 4

## 2018-06-16 NOTE — Consult Note (Addendum)
Cardiology Consultation:   Patient ID: Isaac [name] MRN: [id number]; DOB: 07-07-1955  Admit date: 06/16/2018 Date of Consult: 06/16/2018  Primary Care Provider: Elby Showers, MD Primary Cardiologist: [name], MD  Primary Electrophysiologist:  None    Patient Profile:   Isaac [name] is a 63 y.o. male with a hx of mild CAD on cath '09, DM, HL, HTN, renal CA with right nephrectomy, and bladder CA who is being seen today for the evaluation of chest tightness and dizziness at the request of Arlean Hopping PA.  History of Present Illness:   Isaac [name] is a 63 yo old with PMH of mild CAD on cath '09, DM, HL, HTN, renal CA with right nephrectomy, and bladder CA. He is followed by Dr. Harrington Challenger as a outpatient. Underwent cath in 2009 with mild CAD with follow up myoview in 2014 that was low risk. In the fall of 2018 he had a right nephrectomy 2/2 to renal CA. Underwent chemotherapy for this as well. Most recently was dx with bladder CA and underwent a grade resection of a bladder tumor with Dr. Louis [name].   Reports on Monday he began to feel "under the weather" with significant cough and developed a fever. Also had n/v. Noted he was suppose to have a PET today and bladder procedure. Called today to inform of his symptoms and told they would need to reschedule if he was running a fever. He presented to UC with symptoms and test + for Flu. Reports he had also been NPO since last evening in prep for possible procedures. While there he felt some chest tightness and took 1 SL nitro. Shortly after developed dizziness and lightheadedness. Informed the staff there and was transferred to the ED for further evaluation.  He reports improvement in symptoms while in the ED after given PO fluids. Labs in the ED showed Na+ 131 otherwise stable electrolytes, Cr up at 2.3, though this may be close to baseline as it was 2.24 back in 12/19.Marland Kitchen EKG with no ischemia. Initial troponin 0.03. CXR was negative. Cardiology was  consulted.   Past Medical History:  Diagnosis Date  . Arthritis   . Bladder cancer [facility]) urologist-- dr Louis [name]   11/ 2019 recurrent   . Cancer of renal pelvis, right [facility])    oncologist-  dr [name]--  high grade papillary urothelial carcinoma, Stage III (T3 Nx M0) -- 02-12-2017  s/p  left nephrectomy--- completed chemotherapy 07-27-2017  . Chemotherapy-induced neuropathy (HCC)    feet  . CKD (chronic kidney disease) stage 3, GFR 30-59 ml/min (HCC)   . Coronary artery disease    followed by Dr. [name]; Lexiscan Myoview (10/14):  Low risk, EF 57%, small inf infarct  . Coronary vasospasm ([place]) PER DR ROSS NOTE 03-22-2012--  INTER. TIGHTNESS   PER PT PROBABLE FROM STRESS  . Depression    no rx  . Dyslipidemia    pt unable to tolerate niaspan  . GAD (generalized anxiety disorder)   . GERD (gastroesophageal reflux disease)   . Hearing loss    per pt due to chemotherapy  . History of angina CHRONIC -- CONTROLLED W/ IMDUR  . History of cancer chemotherapy    completed 07-27-2017  for renal carcinoma  . 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 History of non-ST elevation myocardial infarction (NSTEMI) 2005  . Hyperlipidemia   . Hypertension   . Nocturia more than twice per night   . OSA  on CPAP   . Solitary right kidney    s/p left nephrectomy 02-12-2017  . Type 2 diabetes mellitus treated with insulin [facility])    endocrinologist-- dr Steffanie Dunn ([place] in [place])  lov note in epic   . Urgency of urination   . Wears glasses     Past Surgical History:  Procedure Laterality Date  . APPENDECTOMY  08-03-2004  . CARDIAC CATHETERIZATION  x3  last one 06-17-2007   MILD NON-OBSTRUCTIVE CAD/ NORMAL LVF/ 30% LEFT RENAL ARTERY STENOSIS  . CARDIOVASCULAR STRESS TEST  02/23/2013   Low risk nuclear study w/ small inferior wall infarct at mid and basal level with no ischemia/  normal LV function and wall motion , ef 57%  . CYSTO/ BILATERAL  RETROGRADE PYELOGRAM/ RIGHT URETEROSCOPIC LASER FULGURATION URETERAL TUMOR  02-16-2008  . CYSTO/ BLADDER BX  09-13-2008;  08-13-2007; 04-19-2007; 06-01-2006  . CYSTO/ CYSTOGRAM/ URETEROSCOPY  02-21-2009  . CYSTO/ RESECTION BLADDER TUMOR/ LEFT RETROGRADE PYELOGRAM/ RIGHT URETEROSCOPY  08-07-2010   TCC OF BLADDER   S/P DISTAL URETERECTOMY/ REIMPLANTATION  . CYSTO/ RIGHT URETEROSCOPY/ BX URETERAL TUMOR  10-11-2008  . CYSTOSCOPY W/ RETROGRADES  04/19/2012   Procedure: CYSTOSCOPY WITH RETROGRADE PYELOGRAM;  Surgeon: Bernestine Amass, MD;  [facility];  Service: Urology;  Laterality: Bilateral;  30 MINS Cysto, Biopsy, possible TURBT, Bilateral retrograde pyelograms with Mitomycin C instilation Post op   . CYSTOSCOPY W/ RETROGRADES Left 03/25/2018   Procedure: CYSTOSCOPY WITH RETROGRADE PYELOGRAM;  Surgeon: [name], MD;  Location: [facility];  Service: Urology;  Laterality: Left;  . CYSTOSCOPY WITH BIOPSY  04/07/2011   Procedure: CYSTOSCOPY WITH BIOPSY/ RIGHT URETEROSCOPY;  Surgeon: Bernestine Amass, MD;  Location: [facility];  Service: Urology;  Laterality: N/A;    cystoscopy, cystogram, biopsy and fulgeration  . CYSTOSCOPY WITH BIOPSY  12/29/2011   Procedure: CYSTOSCOPY WITH BIOPSY;  Surgeon: Bernestine Amass, MD;  Location: [facility];  Service: Urology;  Laterality: N/A;  [place]    . CYSTOSCOPY WITH RETROGRADE PYELOGRAM, URETEROSCOPY AND STENT PLACEMENT Left 04/28/2018   Procedure: LEFT RETROGRADE PYELOGRAM, DIAGNOSTIC LEFT URETEROSCOPY;  Surgeon: [name], MD;  Location: [facility];  Service: Urology;  Laterality: Left;  . ELBOW SURGERY  07-07-2003   RIGHT ELBOW ARTHROSCOPY W/ OPEN RECONSTRUCTION  . IR FLUORO GUIDE PORT INSERTION RIGHT  04/01/2017  . IR US GUIDE VASC ACCESS RIGHT  04/01/2017  . KNEE ARTHROSCOPY W/ DEBRIDEMENT  02-08-2004   RIGHT KNEE  . RHINOPLASTY W/ MODIFIED CARTILAGE  GRAFT  05-17-2007   INTERNAL NASAL VALVE COLLAPSE/ OSA/ SEPTAL PERFERATION  . RIGHT DISTAL URETERECTOMY W/ REIMPLANTATION  10-30-2008   TCC OF RIGHT DISTAL URETER  . ROBOT ASSITED LAPAROSCOPIC NEPHROURETERECTOMY Right 02/12/2017   Procedure: XI ROBOT ASSITED LAPAROSCOPIC NEPHROURETERECTOMY;  Surgeon: [name], MD;  Location: [facility];  Service: Urology;  Laterality: Right;  . SHOULDER SURGERY  2005   RIGHT ROTATOR CUFF REPAIR  . TOTAL KNEE ARTHROPLASTY Left 05/18/2015  . TOTAL KNEE ARTHROPLASTY Left 05/18/2015   Procedure: TOTAL KNEE ARTHROPLASTY;  Surgeon: Earlie Server, MD;  Location: [place];  Service: Orthopedics;  Laterality: Left;  . TOTAL KNEE ARTHROPLASTY Right 03/21/2016   Procedure: TOTAL KNEE ARTHROPLASTY;  Surgeon: Earlie Server, MD;  Location: [place];  Service: Orthopedics;  Laterality: Right;  . TRANSTHORACIC ECHOCARDIOGRAM  02/03/2017   ef 65-78%, grade 1 diastolic dysfunction/  trivial PR  . TRANSURETHRAL RESECTION OF BLADDER  TUMOR  01-13-2007  . TRANSURETHRAL RESECTION OF BLADDER TUMOR  04/19/2012   Procedure: TRANSURETHRAL RESECTION OF BLADDER TUMOR (TURBT);  Surgeon: Bernestine Amass, MD;  Location: [facility];  Service: Urology;  Laterality: N/A;  . TRANSURETHRAL RESECTION OF BLADDER TUMOR N/A 03/25/2018   Procedure: TRANSURETHRAL RESECTION OF BLADDER TUMOR (TURBT);  Surgeon: [name], MD;  Location: [facility];  Service: Urology;  Laterality: N/A;  . TRANSURETHRAL RESECTION OF BLADDER TUMOR N/A 04/28/2018   Procedure: TRANSURETHRAL RESECTION OF BLADDER TUMOR (TURBT);  Surgeon: [name], MD;  Location: [facility];  Service: Urology;  Laterality: N/A;  . umbillical hernia repair  2004     Home Medications:  Prior to Admission medications   Medication Sig Start Date End Date Taking? Authorizing Provider  ALPRAZolam Duanne Moron) 1 MG tablet Take 1 tablet (1 mg total) by mouth 3 (three) times daily as needed for anxiety.  05/19/18   [place], Holli Humbles, NP  amLODipine (NORVASC) 10 MG tablet Take 10 mg by mouth every morning.     [provider]  aspirin EC 81 MG tablet Take 81 mg by mouth daily.    [provider]  diazepam (VALIUM) 10 MG tablet Take 1 tablet (10 mg total) by mouth at bedtime as needed for anxiety. 04/28/18   [name], MD  DULoxetine (CYMBALTA) 60 MG capsule Take 1 capsule (60 mg total) by mouth daily. Patient not taking: Reported on 04/20/2018 12/16/17   [name], MD  glimepiride (AMARYL) 2 MG tablet TAKE 1 BY MOUTH DAILY BEFORE SUPPER Patient taking differently: Take 2 mg by mouth daily before supper.  11/28/15   Elayne Snare, MD  Insulin Glargine, 1 Unit Dial, (TOUJEO SOLOSTAR) 300 UNIT/ML SOPN Inject 40 Units into the skin at bedtime.    [provider]  isosorbide mononitrate (IMDUR) 120 MG 24 hr tablet TAKE 1 TABLET BY MOUTH DAILY. GENERIC EQUIVALENT FOR IMDUR Patient taking differently: Take 120 mg by mouth every morning.  10/28/17   [name], MD  LORazepam (ATIVAN) 0.5 MG tablet Take 1 tablet (0.5 mg total) by mouth every 6 (six) hours as needed (Nausea or vomiting). Patient not taking: Reported on 04/20/2018 08/03/17   [name], MD  metoprolol succinate (TOPROL-XL) 25 MG 24 hr tablet Take 1 tablet (25 mg total) at bedtime by mouth. Patient taking differently: Take 25 mg by mouth every morning.  03/20/17   [name], MD  nitroGLYCERIN (NITROSTAT) 0.4 MG SL tablet Place 1 tablet (0.4 mg total) under the tongue every 5 (five) minutes as needed for chest pain. 11/29/15   [name], MD  ondansetron (ZOFRAN) 8 MG tablet Take 1 tablet (8 mg total) 2 (two) times daily as needed by mouth. Start on the third day after cisplatin chemotherapy. Patient not taking: Reported on 04/20/2018 08/28/17   [name], MD  pantoprazole (PROTONIX) 40 MG tablet TAKE 1 TABLET BY MOUTH EVERY MORNING Patient taking differently: Take 40 mg by mouth every  morning.  10/28/17   [name], MD  phenazopyridine (PYRIDIUM) 200 MG tablet Take 1 tablet (200 mg total) by mouth 3 (three) times daily as needed for pain. 04/28/18   [name], MD  prochlorperazine (COMPAZINE) 10 MG tablet Take 1 tablet (10 mg total) by mouth every 6 (six) hours as needed (Nausea or vomiting). Patient not taking: Reported on 04/20/2018 08/03/17   [name], MD  Pyridoxine HCl (  VITAMIN B-6) 500 MG tablet Take 1 tablet (500 mg total) by mouth daily. Patient not taking: Reported on 04/20/2018 03/24/18   [name], MD  rOPINIRole (REQUIP) 2 MG tablet Take 1 tablet (2 mg total) by mouth at bedtime. 02/04/18   Elby Showers, MD  rosuvastatin (CRESTOR) 10 MG tablet TAKE 1 TAB (10 MG) BY MOUTH EVERY OTHER DAY 05/11/18   [name], MD  traMADol (ULTRAM) 50 MG tablet Take 1-2 tablets (50-100 mg total) by mouth every 6 (six) hours as needed for moderate pain. 04/28/18   [name], MD    Inpatient Medications: Scheduled Meds:  Continuous Infusions:  PRN Meds:   Allergies:    Allergies  Allergen Reactions  . Dilaudid [Hydromorphone] Nausea And Vomiting  . Hydrocodone Itching and Nausea And Vomiting    Social History:   Social History   Socioeconomic History  . Marital status: Single    Spouse name: Not on file  . Number of children: Not on file  . Years of education: Not on file  . Highest education level: Not on file  Occupational History  . Not on file  Social Needs  . Financial resource strain: Not on file  . Food insecurity:    Worry: Not on file    Inability: Not on file  . Transportation needs:    Medical: Not on file    Non-medical: Not on file  Tobacco Use  . Smoking status: Former Smoker    Packs/day: 0.50    Years: 20.00    Pack years: 10.00    Types: Cigarettes    Last attempt to quit: 05/13/2007    Years since quitting: 11.1  . Smokeless tobacco: Never Used  Substance and Sexual Activity  . Alcohol use: Yes     Comment: social  . Drug use: No  . Sexual activity: Not on file  Lifestyle  . Physical activity:    Days per week: Not on file    Minutes per session: Not on file  . Stress: Not on file  Relationships  . Social connections:    Talks on phone: Not on file    Gets together: Not on file    Attends religious service: Not on file    Active member of club or organization: Not on file    Attends meetings of clubs or organizations: Not on file    Relationship status: Not on file  . Intimate partner violence:    Fear of current or ex partner: Not on file    Emotionally abused: Not on file    Physically abused: Not on file    Forced sexual activity: Not on file  Other Topics Concern  . Not on file  Social History Narrative  . Not on file    Family History:    Family History  Problem Relation Age of Onset  . Coronary artery disease Father   . Diabetes Maternal Grandmother   . Heart disease Paternal Grandfather   . Colon cancer Neg Hx   . Stomach cancer Neg Hx   . Esophageal cancer Neg Hx   . Rectal cancer Neg Hx      ROS:  Please see the history of present illness.   All other ROS reviewed and negative.     Physical Exam/Data:   Vitals:   06/16/18 1136 06/16/18 1253  BP: 108/74   Pulse: 87   Resp: 17   Temp: 99.7 F (37.6 C) 99.7 F (37.6  C)  TempSrc: Oral Oral  SpO2: 98%    No intake or output data in the 24 hours ending 06/16/18 1436 Last 3 Weights 04/28/2018 04/26/2018 04/20/2018  Weight (lbs) 207 lb 204 lb 6 oz 205 lb  Weight (kg) 93.895 kg 92.704 kg 92.987 kg     There is no height or weight on file to calculate BMI.  General:  Well nourished, well developed, in no acute distress HEENT: normal Lymph: no adenopathy Neck: no JVD Endocrine:  No thryomegaly Vascular: No carotid bruits; FA pulses 2+ bilaterally without bruits  Cardiac:  normal S1, S2; RRR; no murmur  Lungs:  clear to auscultation bilaterally, no wheezing, rhonchi or rales  Abd: soft,  nontender, no hepatomegaly  Ext: no edema Musculoskeletal:  No deformities, BUE and BLE strength normal and equal Skin: warm and dry  Neuro:  CNs 2-12 intact, no focal abnormalities noted Psych:  Normal affect   EKG:  The EKG was personally reviewed and demonstrates:  SR without ischemia   Relevant CV Studies:   Laboratory Data:  Chemistry Recent Labs  Lab 06/16/18 1258  NA 131*  K 4.3  CL 96*  CO2 24  GLUCOSE 180*  BUN 22  CREATININE 2.30*  CALCIUM 8.6*  GFRNONAA 29*  GFRAA 34*  ANIONGAP 11    No results for input(s): PROT, ALBUMIN, AST, ALT, ALKPHOS, BILITOT in the last 168 hours. Hematology Recent Labs  Lab 06/16/18 1258  WBC 3.8*  RBC 4.02*  HGB 12.3*  HCT 38.4*  MCV 95.5  MCH 30.6  MCHC 32.0  RDW 12.3  PLT 96*   Cardiac EnzymesNo results for input(s): TROPONINI in the last 168 hours.  Recent Labs  Lab 06/16/18 1308  TROPIPOC 0.03    BNPNo results for input(s): BNP, PROBNP in the last 168 hours.  DDimer No results for input(s): DDIMER in the last 168 hours.  Radiology/Studies:  Dg Chest 2 View  Result Date: 06/16/2018 CLINICAL DATA:  Chest pain and diaphoresis. History of bladder carcinoma EXAM: CHEST - 2 VIEW COMPARISON:  May 08, 2015 FINDINGS: Port-A-Cath tip is at the cavoatrial junction. No pneumothorax. Lungs are clear. Heart size and pulmonary vascularity are normal. No adenopathy. No bone lesions. There is calcification in the left carotid artery. IMPRESSION: No edema or consolidation. No neoplastic focus evident. There is left carotid artery calcification. Port-A-Cath tip at cavoatrial junction. Electronically Signed   By: Lowella Grip III M.D.   On: 06/16/2018 13:26    Assessment and Plan:   63 y.o. male with a hx of mild CAD on cath '09, DM, HL, HTN, renal CA with right nephrectomy, and bladder CA who is being seen today for the evaluation of chest tightness and dizziness at the request of Arlean Hopping PA.  1. Chest tightness: Pt  reports significant coughing since Monday and now Dx with Flu. Cath back in 2009 with minimal CAD and low risk Nuc in 2014. EKG without signs of ischemia. Chest is tender with palpation and worse with coughing. Initial Trop 0.03. Given symptoms, do not suspect ACS.  -- recheck 2nd trop and plan for DC if negative  2.   Dizziness/lightheadedness: reports he had been NPO for possible procedure today, along with fever. Suspect symptoms were 2/2 to dehydration in conjunction with taking SL nitro. Blood pressure stable at the time of exam.   3. Renal/bladder CA: followed by oncology. Recent bladder resection back in 12/19.  4. Elevated Cr: in the setting of illness and dehydration.  Though this may be new baseline.   For questions or updates, please contact [place] Please consult [email or web address] for contact info under     Signed, Reino Bellis, NP  06/16/2018 2:36 PM   Patient seen and examined. Agree with assessment and plan.  Isaac [name] is a 63 year old gentleman who has a prior history of documented mild CAD at cardiac catheterization by Dr. Haroldine Laws in 2009, and has a history of renal cell CA status post right nephrectomy in November 2018, and bladder cancer status post recent procedure December 2019.  He has a history of hypertension, hyperlipidemia and diabetes mellitus.  He had recently developed cough, fever, and flulike symptoms.  Today he was supposed to undergo pet imaging procedure in follow-up of his bladder procedure and had been n.p.o.  He presented to urgent care and tested positive for the flu today.  He had developed some vague chest tightness for which he took 1 sublingual nitroglycerin.  He has a history of previous hypotension following nitroglycerin administration and following taking the medication today again developed presyncope leading to transfer to [facility] ER.  In the emergency room he is pain-free.  I suspect he is mildly volume depleted and with his n.p.o. status,  positive diagnosis of flu with his renal insufficiency he most likely transiently became hypotensive following sublingual nitroglycerin administration.  Exam blood pressure is 110/70 pulse is regular in the 80s.  Sclerae anicteric.  He did not have carotid bruits.  He has thick neck.  Lungs were without wheezes.  Rhythm was regular with a soft 1/6 systolic murmur.  He had central adiposity.  Sounds positive.  There was no significant edema.  Neurologically he is grossly nonfocal.  His ECG feels normal sinus rhythm at 80, left axis deviation, and incomplete right bundle branch block.  QTc interval is normal at 412 ms.  ECG is stable demonstrates and does not show any worrisome changes.  Initial troponin is negative.  Recommend 1 additional serial troponin and if no significant change okay to discharge home with initiation of therapy for the flu with hydration and follow-up Cardiologic evaluation with his cardiologist Dr. [name].   Troy Sine, MD, [facility] 06/16/2018 3:32 PM

## 2018-06-16 NOTE — Discharge Instructions (Signed)
Take the medications as prescribed by the urgent care, follow-up with your primary care doctor if not improving in the next week,

## 2018-06-16 NOTE — ED Notes (Signed)
Cards at bedside

## 2018-06-16 NOTE — ED Notes (Signed)
Patient transported to X-ray 

## 2018-06-16 NOTE — ED Triage Notes (Signed)
Pt reports flu for several days- went to UC- got chest pain so took nitro pill and then became diaphoretic and felt lightheaded. EKG was done at [facility]. No chest pain now. Pt has bladder CA and has had right nephrectomy in Nov.

## 2018-06-16 NOTE — ED Provider Notes (Signed)
[place] [facility] EMERGENCY DEPARTMENT Provider Note   CSN: [id number] Arrival date & time: 06/16/18  1125     History   Chief Complaint Chief Complaint  Patient presents with  . Influenza  . Chest Pain    HPI Isaac [name] is a 63 y.o. male.  HPI    Isaac [name] is a 63 y.o. male, with a history of bladder cancer, CKD stage III, CAD, HTN, and hyperlipidemia, presenting to the ED with an episode of chest pain that occurred shortly prior to arrival. Patient was being evaluated at an urgent care for influenza-like symptoms (productive cough, nasal congestion, body aches, and fever), diagnosed with influenza A.  While he was at the urgent care, he had an episode of chest pain, central chest, pressure, 4/10, nonradiating.  Accompanied by dizziness, nausea, and diaphoresis.  States this pain was consistent with pain he experienced during his MI in 2005.  He took 1 nitro and pain resolved. He states he has had 2 cardiac caths, but cannot remember when these occurred, but does not think stents were placed.  Patient is also being treated for active bladder cancer.  Her last chemotherapy infusion was last spring.  Denies current chest pain, shortness of breath, vomiting, diarrhea, abdominal pain, syncope, or any other complaints.  Cardiologist: [name]  Past Medical History:  Diagnosis Date  . Arthritis   . Bladder cancer [facility]) urologist-- dr Louis Meckel   11/ 2019 recurrent   . Cancer of renal pelvis, right [facility])    oncologist-  dr [name]--  high grade papillary urothelial carcinoma, Stage III (T3 Nx M0) -- 02-12-2017  s/p  left nephrectomy--- completed chemotherapy 07-27-2017  . Chemotherapy-induced neuropathy (HCC)    feet  . CKD (chronic kidney disease) stage 3, GFR 30-59 ml/min (HCC)   . Coronary artery disease    followed by Dr. [name]; Lexiscan Myoview (10/14):  Low risk, EF 57%, small inf infarct  . Coronary vasospasm ([place]) PER DR ROSS NOTE  03-22-2012--  INTER. TIGHTNESS   PER PT PROBABLE FROM STRESS  . Depression    no rx  . Dyslipidemia    pt unable to tolerate niaspan  . GAD (generalized anxiety disorder)   . GERD (gastroesophageal reflux disease)   . Hearing loss    per pt due to chemotherapy  . History of angina CHRONIC -- CONTROLLED W/ IMDUR  . History of cancer chemotherapy    completed 07-27-2017  for renal carcinoma  . 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 History of non-ST elevation myocardial infarction (NSTEMI) 2005  . Hyperlipidemia   . Hypertension   . Nocturia more than twice per night   . OSA on CPAP   . Solitary right kidney    s/p left nephrectomy 02-12-2017  . Type 2 diabetes mellitus treated with insulin [facility])    endocrinologist-- dr Steffanie Dunn ([place] in [place])  lov note in epic   . Urgency of urination   . Wears glasses     Patient Active Problem List   Diagnosis Date Noted  . Elevated serum creatinine 03/21/2018  . IDA (iron deficiency anemia) 06/09/2017  . Cancer of renal pelvis, right ([place]) 03/18/2017  . Goals of care, counseling/discussion 03/18/2017  . Renal mass 02/12/2017  . Non-ST elevation MI (NSTEMI) ([place]) 10/18/2016  . Peripheral neuropathy   . OSA on CPAP   . GERD (gastroesophageal reflux disease)   . Type 2 diabetes mellitus ([place])   .  Bladder cancer ([place])   . Gout 10/22/2015  . History of adenomatous polyp of colon 09/07/2013  . Anxiety and depression 06/19/2011  . History of skin cancer 06/19/2011  . Hyperlipidemia 05/10/2008  . CAD, NATIVE VESSEL 05/10/2008  . RESTLESS LEGS SYNDROME 11/17/2007  . Essential hypertension 11/16/2007    Past Surgical History:  Procedure Laterality Date  . APPENDECTOMY  08-03-2004  . CARDIAC CATHETERIZATION  x3  last one 06-17-2007   MILD NON-OBSTRUCTIVE CAD/ NORMAL LVF/ 30% LEFT RENAL ARTERY STENOSIS  . CARDIOVASCULAR STRESS TEST  02/23/2013   Low risk nuclear study w/ small inferior  wall infarct at mid and basal level with no ischemia/  normal LV function and wall motion , ef 57%  . CYSTO/ BILATERAL RETROGRADE PYELOGRAM/ RIGHT URETEROSCOPIC LASER FULGURATION URETERAL TUMOR  02-16-2008  . CYSTO/ BLADDER BX  09-13-2008;  08-13-2007; 04-19-2007; 06-01-2006  . CYSTO/ CYSTOGRAM/ URETEROSCOPY  02-21-2009  . CYSTO/ RESECTION BLADDER TUMOR/ LEFT RETROGRADE PYELOGRAM/ RIGHT URETEROSCOPY  08-07-2010   TCC OF BLADDER   S/P DISTAL URETERECTOMY/ REIMPLANTATION  . CYSTO/ RIGHT URETEROSCOPY/ BX URETERAL TUMOR  10-11-2008  . CYSTOSCOPY W/ RETROGRADES  04/19/2012   Procedure: CYSTOSCOPY WITH RETROGRADE PYELOGRAM;  Surgeon: Bernestine Amass, MD;  Location: [facility];  Service: Urology;  Laterality: Bilateral;  30 MINS Cysto, Biopsy, possible TURBT, Bilateral retrograde pyelograms with Mitomycin C instilation Post op   . CYSTOSCOPY W/ RETROGRADES Left 03/25/2018   Procedure: CYSTOSCOPY WITH RETROGRADE PYELOGRAM;  Surgeon: [name], MD;  Location: [facility];  Service: Urology;  Laterality: Left;  . CYSTOSCOPY WITH BIOPSY  04/07/2011   Procedure: CYSTOSCOPY WITH BIOPSY/ RIGHT URETEROSCOPY;  Surgeon: Bernestine Amass, MD;  Location: [facility];  Service: Urology;  Laterality: N/A;    cystoscopy, cystogram, biopsy and fulgeration  . CYSTOSCOPY WITH BIOPSY  12/29/2011   Procedure: CYSTOSCOPY WITH BIOPSY;  Surgeon: Bernestine Amass, MD;  Location: [facility];  Service: Urology;  Laterality: N/A;  [place]    . CYSTOSCOPY WITH RETROGRADE PYELOGRAM, URETEROSCOPY AND STENT PLACEMENT Left 04/28/2018   Procedure: LEFT RETROGRADE PYELOGRAM, DIAGNOSTIC LEFT URETEROSCOPY;  Surgeon: [name], MD;  Location: [facility];  Service: Urology;  Laterality: Left;  . ELBOW SURGERY  07-07-2003   RIGHT ELBOW ARTHROSCOPY W/ OPEN RECONSTRUCTION  . IR FLUORO GUIDE PORT INSERTION RIGHT  04/01/2017  . IR US GUIDE VASC ACCESS  RIGHT  04/01/2017  . KNEE ARTHROSCOPY W/ DEBRIDEMENT  02-08-2004   RIGHT KNEE  . RHINOPLASTY W/ MODIFIED CARTILAGE GRAFT  05-17-2007   INTERNAL NASAL VALVE COLLAPSE/ OSA/ SEPTAL PERFERATION  . RIGHT DISTAL URETERECTOMY W/ REIMPLANTATION  10-30-2008   TCC OF RIGHT DISTAL URETER  . ROBOT ASSITED LAPAROSCOPIC NEPHROURETERECTOMY Right 02/12/2017   Procedure: XI ROBOT ASSITED LAPAROSCOPIC NEPHROURETERECTOMY;  Surgeon: [name], MD;  Location: [facility];  Service: Urology;  Laterality: Right;  . SHOULDER SURGERY  2005   RIGHT ROTATOR CUFF REPAIR  . TOTAL KNEE ARTHROPLASTY Left 05/18/2015  . TOTAL KNEE ARTHROPLASTY Left 05/18/2015   Procedure: TOTAL KNEE ARTHROPLASTY;  Surgeon: Earlie Server, MD;  Location: [facility];  Service: Orthopedics;  Laterality: Left;  . TOTAL KNEE ARTHROPLASTY Right 03/21/2016   Procedure: TOTAL KNEE ARTHROPLASTY;  Surgeon: Earlie Server, MD;  Location: [facility];  Service: Orthopedics;  Laterality: Right;  . TRANSTHORACIC ECHOCARDIOGRAM  02/03/2017   ef 42-87%, grade 1 diastolic dysfunction/  trivial PR  . TRANSURETHRAL RESECTION OF BLADDER  TUMOR  01-13-2007  . TRANSURETHRAL RESECTION OF BLADDER TUMOR  04/19/2012   Procedure: TRANSURETHRAL RESECTION OF BLADDER TUMOR (TURBT);  Surgeon: Bernestine Amass, MD;  Location: [facility];  Service: Urology;  Laterality: N/A;  . TRANSURETHRAL RESECTION OF BLADDER TUMOR N/A 03/25/2018   Procedure: TRANSURETHRAL RESECTION OF BLADDER TUMOR (TURBT);  Surgeon: [name], MD;  Location: [facility];  Service: Urology;  Laterality: N/A;  . TRANSURETHRAL RESECTION OF BLADDER TUMOR N/A 04/28/2018   Procedure: TRANSURETHRAL RESECTION OF BLADDER TUMOR (TURBT);  Surgeon: [name], MD;  Location: [facility];  Service: Urology;  Laterality: N/A;  . umbillical hernia repair  2004        Home Medications    Prior to Admission medications   Medication Sig Start Date End Date Taking? Authorizing  Provider  ALPRAZolam Duanne Moron) 1 MG tablet Take 1 tablet (1 mg total) by mouth 3 (three) times daily as needed for anxiety. 05/19/18   [place], Holli Humbles, NP  amLODipine (NORVASC) 10 MG tablet Take 10 mg by mouth every morning.     [provider]  aspirin EC 81 MG tablet Take 81 mg by mouth daily.    [provider]  diazepam (VALIUM) 10 MG tablet Take 1 tablet (10 mg total) by mouth at bedtime as needed for anxiety. 04/28/18   [name], MD  DULoxetine (CYMBALTA) 60 MG capsule Take 1 capsule (60 mg total) by mouth daily. Patient not taking: Reported on 04/20/2018 12/16/17   [name], MD  glimepiride (AMARYL) 2 MG tablet TAKE 1 BY MOUTH DAILY BEFORE SUPPER Patient taking differently: Take 2 mg by mouth daily before supper.  11/28/15   Elayne Snare, MD  Insulin Glargine, 1 Unit Dial, (TOUJEO SOLOSTAR) 300 UNIT/ML SOPN Inject 40 Units into the skin at bedtime.    [provider]  isosorbide mononitrate (IMDUR) 120 MG 24 hr tablet TAKE 1 TABLET BY MOUTH DAILY. GENERIC EQUIVALENT FOR IMDUR Patient taking differently: Take 120 mg by mouth every morning.  10/28/17   [name], MD  LORazepam (ATIVAN) 0.5 MG tablet Take 1 tablet (0.5 mg total) by mouth every 6 (six) hours as needed (Nausea or vomiting). Patient not taking: Reported on 04/20/2018 08/03/17   [name], MD  metoprolol succinate (TOPROL-XL) 25 MG 24 hr tablet Take 1 tablet (25 mg total) at bedtime by mouth. Patient taking differently: Take 25 mg by mouth every morning.  03/20/17   [name], MD  nitroGLYCERIN (NITROSTAT) 0.4 MG SL tablet Place 1 tablet (0.4 mg total) under the tongue every 5 (five) minutes as needed for chest pain. 11/29/15   [name], MD  ondansetron (ZOFRAN) 8 MG tablet Take 1 tablet (8 mg total) 2 (two) times daily as needed by mouth. Start on the third day after cisplatin chemotherapy. Patient not taking: Reported on 04/20/2018 08/28/17   [name], MD    pantoprazole (PROTONIX) 40 MG tablet TAKE 1 TABLET BY MOUTH EVERY MORNING Patient taking differently: Take 40 mg by mouth every morning.  10/28/17   [name], MD  phenazopyridine (PYRIDIUM) 200 MG tablet Take 1 tablet (200 mg total) by mouth 3 (three) times daily as needed for pain. 04/28/18   [name], MD  prochlorperazine (COMPAZINE) 10 MG tablet Take 1 tablet (10 mg total) by mouth every 6 (six) hours as needed (Nausea or vomiting). Patient not taking: Reported on 04/20/2018 08/03/17   [place],  Rudell Cobb, MD  Pyridoxine HCl (VITAMIN B-6) 500 MG tablet Take 1 tablet (500 mg total) by mouth daily. Patient not taking: Reported on 04/20/2018 03/24/18   [name], MD  rOPINIRole (REQUIP) 2 MG tablet Take 1 tablet (2 mg total) by mouth at bedtime. 02/04/18   Elby Showers, MD  rosuvastatin (CRESTOR) 10 MG tablet TAKE 1 TAB (10 MG) BY MOUTH EVERY OTHER DAY 05/11/18   [name], MD  traMADol (ULTRAM) 50 MG tablet Take 1-2 tablets (50-100 mg total) by mouth every 6 (six) hours as needed for moderate pain. 04/28/18   [name], MD    Family History Family History  Problem Relation Age of Onset  . Coronary artery disease Father   . Diabetes Maternal Grandmother   . Heart disease Paternal Grandfather   . Colon cancer Neg Hx   . Stomach cancer Neg Hx   . Esophageal cancer Neg Hx   . Rectal cancer Neg Hx     Social History Social History   Tobacco Use  . Smoking status: Former Smoker    Packs/day: 0.50    Years: 20.00    Pack years: 10.00    Types: Cigarettes    Last attempt to quit: 05/13/2007    Years since quitting: 11.1  . Smokeless tobacco: Never Used  Substance Use Topics  . Alcohol use: Yes    Comment: social  . Drug use: No     Allergies   Dilaudid [hydromorphone] and Hydrocodone   Review of Systems Review of Systems  Constitutional: Positive for chills, diaphoresis and fever.  HENT: Positive for congestion.   Respiratory: Positive for  cough. Negative for shortness of breath.   Cardiovascular: Positive for chest pain. Negative for palpitations and leg swelling.  Gastrointestinal: Positive for nausea. Negative for abdominal pain, diarrhea and vomiting.  Musculoskeletal: Positive for myalgias.  Neurological: Negative for syncope.  All other systems reviewed and are negative.    Physical Exam Updated Vital Signs BP 108/74 (BP Location: Right Arm)   Pulse 87   Temp 99.7 F (37.6 C) (Oral)   Resp 17   SpO2 98%   Physical Exam Vitals signs and nursing note reviewed.  Constitutional:      General: He is not in acute distress.    Appearance: He is well-developed. He is not diaphoretic.  HENT:     Head: Normocephalic and atraumatic.     Mouth/Throat:     Mouth: Mucous membranes are moist.     Pharynx: Oropharynx is clear.  Eyes:     Conjunctiva/sclera: Conjunctivae normal.  Neck:     Musculoskeletal: Neck supple.  Cardiovascular:     Rate and Rhythm: Normal rate and regular rhythm.     Pulses: Normal pulses.     Heart sounds: Normal heart sounds.  Pulmonary:     Effort: Pulmonary effort is normal. No respiratory distress.     Breath sounds: Normal breath sounds.  Abdominal:     Palpations: Abdomen is soft.     Tenderness: There is no abdominal tenderness. There is no guarding.  Musculoskeletal:     Right lower leg: No edema.     Left lower leg: No edema.  Lymphadenopathy:     Cervical: No cervical adenopathy.  Skin:    General: Skin is warm and dry.  Neurological:     Mental Status: He is alert.  Psychiatric:        Mood and Affect: Mood and affect normal.  Speech: Speech normal.        Behavior: Behavior normal.      ED Treatments / Results  Labs (all labs ordered are listed, but only abnormal results are displayed) Labs Reviewed  BASIC METABOLIC PANEL - Abnormal; Notable for the following components:      Result Value   Sodium 131 (*)    Chloride 96 (*)    Glucose, Bld 180 (*)     Creatinine, Ser 2.30 (*)    Calcium 8.6 (*)    GFR calc non Af Amer 29 (*)    GFR calc Af Amer 34 (*)    All other components within normal limits  CBC WITH DIFFERENTIAL/PLATELET - Abnormal; Notable for the following components:   WBC 3.8 (*)    RBC 4.02 (*)    Hemoglobin 12.3 (*)    HCT 38.4 (*)    Platelets 96 (*)    All other components within normal limits  I-STAT TROPONIN, ED  I-STAT TROPONIN, ED   BUN  Date Value Ref Range Status  06/16/2018 22 8 - 23 mg/dL Final  04/26/2018 30 (H) 8 - 23 mg/dL Final  03/24/2018 16 7 - 22 mg/dL Final  03/09/2018 25 7 - 25 mg/dL Final   BUN, Bld  Date Value Ref Range Status  05/04/2017 28 (H) 7 - 22 mg/dL Final  04/27/2017 15 7 - 22 mg/dL Final  04/13/2017 17 7 - 22 mg/dL Final  04/06/2017 16 7 - 22 mg/dL Final   Creatinine  Date Value Ref Range Status  03/24/2018 1.70 (H) 0.60 - 1.20 mg/dL Final  12/16/2017 1.80 (H) 0.60 - 1.20 mg/dL Final  10/15/2017 1.80 (H) 0.60 - 1.20 mg/dL Final  09/07/2017 2.10 (H) 0.60 - 1.20 mg/dL Final   Creat  Date Value Ref Range Status  03/09/2018 1.90 (H) 0.70 - 1.25 mg/dL Final    Comment:    For patients >60 years of age, the reference limit for Creatinine is approximately 13% higher for people identified as African-American. .   05/04/2017 1.2 0.6 - 1.2 mg/dl Final  04/27/2017 1.3 (H) 0.6 - 1.2 mg/dl Final  04/13/2017 1.4 (H) 0.6 - 1.2 mg/dl Final   Creatinine, Ser  Date Value Ref Range Status  06/16/2018 2.30 (H) 0.61 - 1.24 mg/dL Final  04/26/2018 2.24 (H) 0.61 - 1.24 mg/dL Final     EKG EKG Interpretation  Date/Time:  Wednesday June 16 2018 13:06:14 EST Ventricular Rate:  80 PR Interval:  170 QRS Duration: 92 QT Interval:  358 QTC Calculation: 412 R Axis:   -61 Text Interpretation:  Normal sinus rhythm Left axis deviation Incomplete right bundle branch block Abnormal ECG Since last tracing rate faster Confirmed by Dorie Rank [phone]) on 06/16/2018 1:25:03  PM   Radiology Dg Chest 2 View  Result Date: 06/16/2018 CLINICAL DATA:  Chest pain and diaphoresis. History of bladder carcinoma EXAM: CHEST - 2 VIEW COMPARISON:  May 08, 2015 FINDINGS: Port-A-Cath tip is at the cavoatrial junction. No pneumothorax. Lungs are clear. Heart size and pulmonary vascularity are normal. No adenopathy. No bone lesions. There is calcification in the left carotid artery. IMPRESSION: No edema or consolidation. No neoplastic focus evident. There is left carotid artery calcification. Port-A-Cath tip at cavoatrial junction. Electronically Signed   By: Lowella Grip III M.D.   On: 06/16/2018 13:26    Procedures Procedures (including critical care time)  Medications Ordered in ED Medications  aspirin chewable tablet 324 mg (324 mg Oral Given 06/16/18 1340)  Initial Impression / Assessment and Plan / ED Course  I have reviewed the triage vital signs and the nursing notes.  Pertinent labs & imaging results that were available during my care of the patient were reviewed by me and considered in my medical decision making (see chart for details).  Clinical Course as of Jun 17 1519  Wed Jun 16, 2018  1345 Spoke with Wannetta Sender, cardiology master. Someone will come see the patient.   [SJ]    Clinical Course User Index [SJ] Swain Acree C, PA-C    Patient presents following an episode of chest pain.  He does have chest discomfort that he states is from his coughing, however, he described an episode of chest pressure that he states was consistent with his last MI.  Therefore, further chest pain investigation was pursued. Initial troponin negative.  No acute EKG changes.  Chest x-ray without acute abnormality.  Dr. Tomi Bamberger took over patient care at the end of my shift. Plan: Patient has been assessed by cardiology NP.  Will likely need to review cardiology note after MD review.  Expect delta troponin and then discharge.  Second troponin due at 1600.  Vitals:   06/16/18  1136 06/16/18 1253  BP: 108/74   Pulse: 87   Resp: 17   Temp: 99.7 F (37.6 C) 99.7 F (37.6 C)  TempSrc: Oral Oral  SpO2: 98%      Final Clinical Impressions(s) / ED Diagnoses   Final diagnoses:  None    ED Discharge Orders    None       Layla Maw 06/16/18 1527    Dorie Rank, MD 06/16/18 [phone]

## 2018-06-16 NOTE — ED Notes (Signed)
Pt refused restick 2nd trop

## 2018-06-23 DIAGNOSIS — C678 Malignant neoplasm of overlapping sites of bladder: Secondary | ICD-10-CM | POA: Diagnosis not present

## 2018-06-23 DIAGNOSIS — Z5111 Encounter for antineoplastic chemotherapy: Secondary | ICD-10-CM | POA: Diagnosis not present

## 2018-06-24 ENCOUNTER — Other Ambulatory Visit: Payer: Medicare Other

## 2018-06-24 ENCOUNTER — Ambulatory Visit: Payer: Medicare Other | Admitting: Hematology & Oncology

## 2018-06-25 ENCOUNTER — Ambulatory Visit (HOSPITAL_COMMUNITY)
Admission: RE | Admit: 2018-06-25 | Discharge: 2018-06-25 | Disposition: A | Discharge status: Home / Self Care | Payer: Medicare Other | Source: Ambulatory Visit | Attending: Hematology & Oncology | Admitting: Hematology & Oncology

## 2018-06-25 DIAGNOSIS — C651 Malignant neoplasm of right renal pelvis: Secondary | ICD-10-CM | POA: Insufficient documentation

## 2018-06-25 DIAGNOSIS — C679 Malignant neoplasm of bladder, unspecified: Secondary | ICD-10-CM | POA: Diagnosis not present

## 2018-06-25 LAB — GLUCOSE, CAPILLARY: Glucose-Capillary: 147 mg/dL — ABNORMAL HIGH (ref 70–99)

## 2018-06-25 MED ORDER — FLUDEOXYGLUCOSE F - 18 (FDG) INJECTION
9.7000 | Freq: Once | INTRAVENOUS | Status: AC | PRN
  Administered 2018-06-25: 9.7 via INTRAVENOUS

## 2018-06-28 ENCOUNTER — Telehealth: Payer: Self-pay | Admitting: *Deleted

## 2018-06-28 NOTE — Telephone Encounter (Signed)
As noted below by Dr. [name], I informed patient that the PET scan does not show any recurrent cancer. He verbalized understanding.

## 2018-06-28 NOTE — Telephone Encounter (Signed)
-----   Message from [name], MD sent at 06/28/2018  6:48 AM EST ----- Call - the PET scan does NOT show any recurrent cancer!!!  pete

## 2018-06-29 DIAGNOSIS — Z794 Long term (current) use of insulin: Secondary | ICD-10-CM | POA: Diagnosis not present

## 2018-06-29 DIAGNOSIS — E1165 Type 2 diabetes mellitus with hyperglycemia: Secondary | ICD-10-CM | POA: Diagnosis not present

## 2018-06-30 DIAGNOSIS — Z5111 Encounter for antineoplastic chemotherapy: Secondary | ICD-10-CM | POA: Diagnosis not present

## 2018-06-30 DIAGNOSIS — C678 Malignant neoplasm of overlapping sites of bladder: Secondary | ICD-10-CM | POA: Diagnosis not present

## 2018-07-06 ENCOUNTER — Encounter: Payer: Self-pay | Admitting: Hematology & Oncology

## 2018-07-06 ENCOUNTER — Inpatient Hospital Stay: Payer: Medicare Other | Attending: Hematology & Oncology

## 2018-07-06 ENCOUNTER — Inpatient Hospital Stay: Payer: Medicare Other

## 2018-07-06 ENCOUNTER — Inpatient Hospital Stay (HOSPITAL_BASED_OUTPATIENT_CLINIC_OR_DEPARTMENT_OTHER): Payer: Medicare Other | Admitting: Hematology & Oncology

## 2018-07-06 ENCOUNTER — Other Ambulatory Visit: Payer: Self-pay

## 2018-07-06 VITALS — BP 142/64 | HR 73 | Temp 98.7°F | Resp 19 | Wt 204.0 lb

## 2018-07-06 DIAGNOSIS — E1122 Type 2 diabetes mellitus with diabetic chronic kidney disease: Secondary | ICD-10-CM | POA: Diagnosis not present

## 2018-07-06 DIAGNOSIS — Z9221 Personal history of antineoplastic chemotherapy: Secondary | ICD-10-CM

## 2018-07-06 DIAGNOSIS — Z8553 Personal history of malignant neoplasm of renal pelvis: Secondary | ICD-10-CM | POA: Diagnosis not present

## 2018-07-06 DIAGNOSIS — C651 Malignant neoplasm of right renal pelvis: Secondary | ICD-10-CM

## 2018-07-06 DIAGNOSIS — C679 Malignant neoplasm of bladder, unspecified: Secondary | ICD-10-CM

## 2018-07-06 DIAGNOSIS — F419 Anxiety disorder, unspecified: Secondary | ICD-10-CM

## 2018-07-06 DIAGNOSIS — D508 Other iron deficiency anemias: Secondary | ICD-10-CM

## 2018-07-06 DIAGNOSIS — R112 Nausea with vomiting, unspecified: Secondary | ICD-10-CM

## 2018-07-06 DIAGNOSIS — Z95828 Presence of other vascular implants and grafts: Secondary | ICD-10-CM

## 2018-07-06 LAB — CBC WITH DIFFERENTIAL (CANCER CENTER ONLY)
ABS IMMATURE GRANULOCYTES: 0.01 10*3/uL (ref 0.00–0.07)
Basophils Absolute: 0 10*3/uL (ref 0.0–0.1)
Basophils Relative: 1 %
Eosinophils Absolute: 0.1 10*3/uL (ref 0.0–0.5)
Eosinophils Relative: 3 %
HCT: 38.5 % — ABNORMAL LOW (ref 39.0–52.0)
Hemoglobin: 13 g/dL (ref 13.0–17.0)
Immature Granulocytes: 0 %
Lymphocytes Relative: 28 %
Lymphs Abs: 1 10*3/uL (ref 0.7–4.0)
MCH: 31.8 pg (ref 26.0–34.0)
MCHC: 33.8 g/dL (ref 30.0–36.0)
MCV: 94.1 fL (ref 80.0–100.0)
Monocytes Absolute: 0.4 10*3/uL (ref 0.1–1.0)
Monocytes Relative: 12 %
Neutro Abs: 2 10*3/uL (ref 1.7–7.7)
Neutrophils Relative %: 56 %
Platelet Count: 115 10*3/uL — ABNORMAL LOW (ref 150–400)
RBC: 4.09 MIL/uL — ABNORMAL LOW (ref 4.22–5.81)
RDW: 12.7 % (ref 11.5–15.5)
WBC Count: 3.5 10*3/uL — ABNORMAL LOW (ref 4.0–10.5)
nRBC: 0 % (ref 0.0–0.2)

## 2018-07-06 LAB — CMP (CANCER CENTER ONLY)
ALT: 39 U/L (ref 0–44)
AST: 22 U/L (ref 15–41)
Albumin: 4.3 g/dL (ref 3.5–5.0)
Alkaline Phosphatase: 56 U/L (ref 38–126)
Anion gap: 10 (ref 5–15)
BUN: 25 mg/dL — ABNORMAL HIGH (ref 8–23)
CO2: 24 mmol/L (ref 22–32)
CREATININE: 1.86 mg/dL — AB (ref 0.61–1.24)
Calcium: 9.3 mg/dL (ref 8.9–10.3)
Chloride: 96 mmol/L — ABNORMAL LOW (ref 98–111)
GFR, EST NON AFRICAN AMERICAN: 38 mL/min — AB (ref >60)
GFR, Est AFR Am: 44 mL/min — ABNORMAL LOW (ref >60)
Glucose, Bld: 487 mg/dL — ABNORMAL HIGH (ref 70–99)
Potassium: 4.2 mmol/L (ref 3.5–5.1)
Sodium: 130 mmol/L — ABNORMAL LOW (ref 135–145)
Total Bilirubin: 0.7 mg/dL (ref 0.3–1.2)
Total Protein: 6.2 g/dL — ABNORMAL LOW (ref 6.5–8.1)

## 2018-07-06 MED ORDER — HEPARIN SOD (PORK) LOCK FLUSH 100 UNIT/ML IV SOLN
500.0000 [IU] | Freq: Once | INTRAVENOUS | Status: AC
  Administered 2018-07-06: 500 [IU] via INTRAVENOUS
  Filled 2018-07-06: qty 5

## 2018-07-06 MED ORDER — ALPRAZOLAM 1 MG PO TABS: 1.0000 mg | ORAL_TABLET | Freq: Three times a day (TID) | ORAL | 0 refills | Status: DC | PRN

## 2018-07-06 MED ORDER — VITAMIN B-6 500 MG PO TABS: 500.0000 mg | ORAL_TABLET | Freq: Every day | ORAL | 6 refills | Status: DC

## 2018-07-06 MED ORDER — VITAMIN B-6 500 MG PO TABS: 500.0000 mg | ORAL_TABLET | Freq: Every day | ORAL | 3 refills | Status: DC

## 2018-07-06 MED ORDER — SODIUM CHLORIDE 0.9% FLUSH
10.0000 mL | INTRAVENOUS | Status: DC | PRN
  Administered 2018-07-06: 10 mL via INTRAVENOUS
  Filled 2018-07-06: qty 10

## 2018-07-06 MED FILL — VIT B-6 200 MG TABLET: 200 MG | 24 days supply | Qty: 60 | Fill #0

## 2018-07-06 MED FILL — ALPRAZolam 1 MG TABS: 1 | 30 days supply | Qty: 90 | Fill #0

## 2018-07-06 NOTE — Progress Notes (Signed)
Hematology and Oncology Follow Up Visit  Isaac [name] [id number] [birth year] 63 y.o. 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 is here today for follow-up.  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  He apparently had 6 tumors that were resected.  He has had 3 cycles to date.  He has 3 more to go.  As far as his urothelial carcinoma is concerned, we did a PET scan on him a week or so ago.  The PET scan did not show any evidence of disease outside of the bladder.  His blood sugars are horrible.  Today, his blood sugar is 487.  I am not sure why they are so high.  I am sure that his endocrinologist will address this.  He has had no problems with the Port-A-Cath.  We flushes every 6 weeks.  He has had no diarrhea.  He has had no bleeding.  He has had no leg swelling.  There is been no cough or shortness of breath.  Overall, his performance status is ECOG 1.   Medications:  Allergies as of 07/06/2018      Reactions   Dilaudid [hydromorphone] Nausea And Vomiting   Hydrocodone Itching, Nausea And Vomiting      Medication List       Accurate as of July 06, 2018  1:52 PM. Always use your most recent med list.        ALPRAZolam 1 MG tablet Commonly known as:  XANAX Take 1 tablet (1 mg total) by mouth 3 (three) times daily as needed for anxiety.   amLODipine 10 MG tablet Commonly known as:  NORVASC Take 10 mg by mouth every morning.   aspirin EC 81 MG tablet Take 81 mg by mouth daily.   diazepam 10 MG tablet Commonly known as:  VALIUM Take 1 tablet (10 mg total) by mouth at bedtime as needed for anxiety.   glimepiride 2 MG tablet Commonly known as:  AMARYL TAKE 1  BY MOUTH DAILY BEFORE SUPPER   isosorbide mononitrate 120 MG 24 hr tablet Commonly known as:  IMDUR TAKE 1 TABLET BY MOUTH DAILY. GENERIC EQUIVALENT FOR IMDUR   LORazepam 0.5 MG tablet Commonly known as:  ATIVAN Take 1 tablet (0.5 mg total) by mouth every 6 (six) hours as needed (Nausea or vomiting).   meloxicam 15 MG tablet Commonly known as:  MOBIC Take 15 mg by mouth daily.   metoprolol succinate 25 MG 24 hr tablet Commonly known as:  TOPROL-XL Take 1 tablet (25 mg total) at bedtime by mouth.   nitroGLYCERIN 0.4 MG SL tablet Commonly known as:  NITROSTAT Place 1 tablet (0.4 mg total) under the tongue every 5 (five) minutes as needed for chest pain.   ondansetron 8 MG tablet Commonly known as:  ZOFRAN Take 1 tablet (8 mg total) 2 (two) times daily as needed by mouth. Start on the third day after cisplatin chemotherapy.   pantoprazole 40 MG tablet Commonly known as:  PROTONIX TAKE 1 TABLET BY MOUTH EVERY MORNING   phenazopyridine 200 MG tablet Commonly known as:  PYRIDIUM Take 1 tablet (200 mg total) by mouth 3 (three) times daily as needed for pain.   prochlorperazine 10 MG tablet Commonly known as:  COMPAZINE Take 1 tablet (10 mg  total) by mouth every 6 (six) hours as needed (Nausea or vomiting).   rOPINIRole 2 MG tablet Commonly known as:  REQUIP Take 1 tablet (2 mg total) by mouth at bedtime.   rosuvastatin 10 MG tablet Commonly known as:  CRESTOR TAKE 1 TAB (10 MG) BY MOUTH EVERY OTHER DAY   TOUJEO SOLOSTAR 300 UNIT/ML Sopn Generic drug:  Insulin Glargine (1 Unit Dial) Inject 40 Units into the skin at bedtime.   traMADol 50 MG tablet Commonly known as:  ULTRAM Take 1-2 tablets (50-100 mg total) by mouth every 6 (six) hours as needed for moderate pain.   vitamin B-6 500 MG tablet Take 1 tablet (500 mg total) by mouth daily.       Allergies:  Allergies  Allergen Reactions  . Dilaudid [Hydromorphone] Nausea And Vomiting  . Hydrocodone Itching and  Nausea And Vomiting    Past Medical History, Surgical history, Social history, and Family History were reviewed and updated.  Review of Systems: Review of Systems  Constitutional: Negative.   HENT: Positive for tinnitus.   Eyes: Negative.   Respiratory: Negative.   Cardiovascular: Negative.   Gastrointestinal: Positive for nausea.  Genitourinary: Negative.   Musculoskeletal: Negative.   Skin: Negative.   Neurological: Negative.   Endo/Heme/Allergies: Negative.   Psychiatric/Behavioral: Negative.      Physical Exam:  weight is 204 lb (92.5 kg). His oral temperature is 98.7 F (37.1 C). His blood pressure is 142/64 (abnormal) and his pulse is 73. His respiration is 19 and oxygen saturation is 100%.   Wt Readings from Last 3 Encounters:  07/06/18 204 lb (92.5 kg)  04/28/18 207 lb (93.9 kg)  04/26/18 204 lb 6 oz (92.7 kg)    Physical Exam Vitals signs reviewed.  HENT:     Head: Normocephalic and atraumatic.  Eyes:     Pupils: Pupils are equal, round, and reactive to light.  Neck:     Musculoskeletal: Normal range of motion.  Cardiovascular:     Rate and Rhythm: Normal rate and regular rhythm.     Heart sounds: Normal heart sounds.  Pulmonary:     Effort: Pulmonary effort is normal.     Breath sounds: Normal breath sounds.  Abdominal:     General: Bowel sounds are normal.     Palpations: Abdomen is soft.  Musculoskeletal: Normal range of motion.        General: No tenderness or deformity.  Lymphadenopathy:     Cervical: No cervical adenopathy.  Skin:    General: Skin is warm and dry.     Findings: No erythema or rash.  Neurological:     Mental Status: He is alert and oriented to person, place, and time.  Psychiatric:        Behavior: Behavior normal.        Thought Content: Thought content normal.        Judgment: Judgment normal.     Lab Results  Component Value Date   WBC 3.5 (L) 07/06/2018   HGB 13.0 07/06/2018   HCT 38.5 (L) 07/06/2018   MCV 94.1  07/06/2018   PLT 115 (L) 07/06/2018   Lab Results  Component Value Date   FERRITIN 270 10/15/2017   IRON 71 10/15/2017   TIBC 299 10/15/2017   UIBC 227 10/15/2017   IRONPCTSAT 24 (L) 10/15/2017   Lab Results  Component Value Date   RETICCTPCT 2.2 (H) 10/15/2017   RBC 4.09 (L) 07/06/2018   No results found for: KPAFRELGTCHN, LAMBDASER,  [facility] Lab Results  Component Value Date   IGA 232 01/25/2015   No results found for: Kathrynn Ducking, MSPIKE, SPEI   Chemistry      Component Value Date/Time   NA 130 (L) 07/06/2018 1155   NA 134 05/04/2017 0925   K 4.2 07/06/2018 1155   K 4.5 05/04/2017 0925   CL 96 (L) 07/06/2018 1155   CL 97 (L) 05/04/2017 0925   CO2 24 07/06/2018 1155   CO2 23 05/04/2017 0925   BUN 25 (H) 07/06/2018 1155   BUN 28 (H) 05/04/2017 0925   CREATININE 1.86 (H) 07/06/2018 1155   CREATININE 1.90 (H) 03/09/2018 1034   GLU 86 03/31/2016      Component Value Date/Time   CALCIUM 9.3 07/06/2018 1155   CALCIUM 9.3 05/04/2017 0925   ALKPHOS 56 07/06/2018 1155   ALKPHOS 58 05/04/2017 0925   AST 22 07/06/2018 1155   ALT 39 07/06/2018 1155   ALT 90 (H) 05/04/2017 0925   BILITOT 0.7 07/06/2018 1155      Impression and Plan: [name] is a very pleasant 63 yo caucasian gentleman with infiltrating high grade papillary urothelial carcinoma with right nephrectomy in October 2018.   At this point, we are in a surveillance mode.  I will go ahead and set him up with another PET scan to be done in 3 months.  If this PET scan looks okay, then we might be able to move the PET scans to every 6 months.  I do think that he is at a significant risk for recurrent disease.  I will plan to have him come back in 3 months.  We will do the PET scan before I see him back.  He will have his Port-A-Cath flushed in 6 weeks.   I told him that if he does not respond to the intravesicular BCG therapy, there have been some recent  trials that have shown that immunotherapy does seem to help with superficial bladder cancer.  [name], MD 2/25/20201:52 PM

## 2018-07-07 DIAGNOSIS — Z5111 Encounter for antineoplastic chemotherapy: Secondary | ICD-10-CM | POA: Diagnosis not present

## 2018-07-07 DIAGNOSIS — C678 Malignant neoplasm of overlapping sites of bladder: Secondary | ICD-10-CM | POA: Diagnosis not present

## 2018-07-07 LAB — IRON AND TIBC
Iron: 98 ug/dL (ref 42–163)
Saturation Ratios: 27 % (ref 20–55)
TIBC: 371 ug/dL (ref 202–409)
UIBC: 273 ug/dL (ref 117–376)

## 2018-07-07 LAB — FERRITIN: Ferritin: 52 ng/mL (ref 24–336)

## 2018-07-09 DIAGNOSIS — E119 Type 2 diabetes mellitus without complications: Secondary | ICD-10-CM | POA: Diagnosis not present

## 2018-07-09 DIAGNOSIS — Z794 Long term (current) use of insulin: Secondary | ICD-10-CM | POA: Diagnosis not present

## 2018-07-10 ENCOUNTER — Other Ambulatory Visit: Payer: Self-pay | Admitting: Internal Medicine

## 2018-07-14 DIAGNOSIS — C678 Malignant neoplasm of overlapping sites of bladder: Secondary | ICD-10-CM | POA: Diagnosis not present

## 2018-07-18 ENCOUNTER — Encounter (HOSPITAL_COMMUNITY): Payer: Self-pay

## 2018-07-18 ENCOUNTER — Emergency Department (HOSPITAL_COMMUNITY)
Admission: EM | Admit: 2018-07-18 | Discharge: 2018-07-18 | Disposition: A | Discharge status: Home / Self Care | Payer: Medicare Other | Attending: Emergency Medicine | Admitting: Emergency Medicine

## 2018-07-18 ENCOUNTER — Other Ambulatory Visit: Payer: Self-pay

## 2018-07-18 DIAGNOSIS — I129 Hypertensive chronic kidney disease with stage 1 through stage 4 chronic kidney disease, or unspecified chronic kidney disease: Secondary | ICD-10-CM | POA: Insufficient documentation

## 2018-07-18 DIAGNOSIS — R21 Rash and other nonspecific skin eruption: Secondary | ICD-10-CM | POA: Diagnosis present

## 2018-07-18 DIAGNOSIS — B029 Zoster without complications: Secondary | ICD-10-CM | POA: Diagnosis not present

## 2018-07-18 DIAGNOSIS — N183 Chronic kidney disease, stage 3 (moderate): Secondary | ICD-10-CM | POA: Insufficient documentation

## 2018-07-18 DIAGNOSIS — Z87891 Personal history of nicotine dependence: Secondary | ICD-10-CM | POA: Diagnosis not present

## 2018-07-18 DIAGNOSIS — Z79899 Other long term (current) drug therapy: Secondary | ICD-10-CM | POA: Diagnosis not present

## 2018-07-18 DIAGNOSIS — R110 Nausea: Secondary | ICD-10-CM | POA: Diagnosis not present

## 2018-07-18 LAB — COMPREHENSIVE METABOLIC PANEL
ALT: 50 U/L — ABNORMAL HIGH (ref 0–44)
AST: 55 U/L — AB (ref 15–41)
Albumin: 3.6 g/dL (ref 3.5–5.0)
Alkaline Phosphatase: 50 U/L (ref 38–126)
Anion gap: 7 (ref 5–15)
BUN: 17 mg/dL (ref 8–23)
CO2: 26 mmol/L (ref 22–32)
CREATININE: 2.13 mg/dL — AB (ref 0.61–1.24)
Calcium: 8.5 mg/dL — ABNORMAL LOW (ref 8.9–10.3)
Chloride: 99 mmol/L (ref 98–111)
GFR calc Af Amer: 37 mL/min — ABNORMAL LOW (ref >60)
GFR calc non Af Amer: 32 mL/min — ABNORMAL LOW (ref >60)
Glucose, Bld: 264 mg/dL — ABNORMAL HIGH (ref 70–99)
Potassium: 4.3 mmol/L (ref 3.5–5.1)
Sodium: 132 mmol/L — ABNORMAL LOW (ref 135–145)
Total Bilirubin: 0.7 mg/dL (ref 0.3–1.2)
Total Protein: 6.3 g/dL — ABNORMAL LOW (ref 6.5–8.1)

## 2018-07-18 LAB — CBC WITH DIFFERENTIAL/PLATELET
Abs Immature Granulocytes: 0.02 10*3/uL (ref 0.00–0.07)
Basophils Absolute: 0 10*3/uL (ref 0.0–0.1)
Basophils Relative: 0 %
EOS ABS: 0.1 10*3/uL (ref 0.0–0.5)
Eosinophils Relative: 1 %
HCT: 36.4 % — ABNORMAL LOW (ref 39.0–52.0)
Hemoglobin: 11.9 g/dL — ABNORMAL LOW (ref 13.0–17.0)
Immature Granulocytes: 1 %
Lymphocytes Relative: 29 %
Lymphs Abs: 1 10*3/uL (ref 0.7–4.0)
MCH: 30.6 pg (ref 26.0–34.0)
MCHC: 32.7 g/dL (ref 30.0–36.0)
MCV: 93.6 fL (ref 80.0–100.0)
Monocytes Absolute: 0.6 10*3/uL (ref 0.1–1.0)
Monocytes Relative: 18 %
Neutro Abs: 1.8 10*3/uL (ref 1.7–7.7)
Neutrophils Relative %: 51 %
Platelets: 105 10*3/uL — ABNORMAL LOW (ref 150–400)
RBC: 3.89 MIL/uL — ABNORMAL LOW (ref 4.22–5.81)
RDW: 12.9 % (ref 11.5–15.5)
WBC: 3.5 10*3/uL — ABNORMAL LOW (ref 4.0–10.5)
nRBC: 0 % (ref 0.0–0.2)

## 2018-07-18 MED ORDER — SODIUM CHLORIDE 0.9% FLUSH: 10.0000 mL | INTRAVENOUS | Status: DC | PRN

## 2018-07-18 MED ORDER — ONDANSETRON HCL 4 MG/2ML IJ SOLN
4.0000 mg | Freq: Once | INTRAMUSCULAR | Status: AC
  Administered 2018-07-18: 4 mg via INTRAVENOUS
  Filled 2018-07-18: qty 2

## 2018-07-18 MED ORDER — VALACYCLOVIR HCL 1 G PO TABS: 1000.0000 mg | ORAL_TABLET | Freq: Two times a day (BID) | ORAL | 0 refills | Status: AC

## 2018-07-18 MED ORDER — LACTATED RINGERS IV BOLUS
1000.0000 mL | Freq: Once | INTRAVENOUS | Status: AC
  Administered 2018-07-18: 1000 mL via INTRAVENOUS

## 2018-07-18 MED ORDER — ACETAMINOPHEN 500 MG PO TABS
1000.0000 mg | ORAL_TABLET | Freq: Once | ORAL | Status: AC
  Administered 2018-07-18: 1000 mg via ORAL
  Filled 2018-07-18: qty 2

## 2018-07-18 MED ORDER — HYDROMORPHONE HCL 1 MG/ML IJ SOLN
1.0000 mg | Freq: Once | INTRAMUSCULAR | Status: AC
  Administered 2018-07-18: 1 mg via INTRAVENOUS
  Filled 2018-07-18: qty 1

## 2018-07-18 MED ORDER — ONDANSETRON 4 MG PO TBDP: 4.0000 mg | ORAL_TABLET | Freq: Once | ORAL | Status: DC

## 2018-07-18 MED ORDER — HEPARIN SOD (PORK) LOCK FLUSH 100 UNIT/ML IV SOLN
500.0000 [IU] | Freq: Once | INTRAVENOUS | Status: AC
  Administered 2018-07-18: 500 [IU]
  Filled 2018-07-18: qty 5

## 2018-07-18 NOTE — ED Triage Notes (Signed)
Pt arrives POV with girlfriend from home c/o rash that started on Tuesday 07/13/2018. Pt's insurance ([employer]) send a PA from [facility] to assess pt in home; dx'd pt with shingles per pt. Pt has hx of cancer and is currently receiving chemotherapy treatments. Rash is on upper right arm and is "spreading" down his forearm. Pt a&o.

## 2018-07-18 NOTE — ED Notes (Signed)
EKG given to Dr. Magdalene Molly.

## 2018-07-18 NOTE — ED Notes (Signed)
ED Provider at bedside. 

## 2018-07-18 NOTE — ED Provider Notes (Signed)
[facility] EMERGENCY DEPARTMENT Provider Note   CSN: [id number] Arrival date & time: 07/18/18  1810    History   Chief Complaint Chief Complaint  Patient presents with  . Rash    possible shingles    HPI Isaac [name] is a 63 y.o. male.      Rash  Location:  Shoulder/arm Shoulder/arm rash location:  R shoulder, R upper arm, R forearm and R hand Quality: blistering and painful   Pain details:    Quality:  Aching   Severity:  Mild   Onset quality:  Gradual   Duration:  3 days   Timing:  Constant   Progression:  Worsening Severity:  Moderate Onset quality:  Gradual Timing:  Constant Progression:  Spreading Chronicity:  New Context: not diapers, not insect bite/sting and not pollen   Relieved by:  None tried Worsened by:  Nothing Ineffective treatments:  None tried (Started Valtrex yesterday, has had 3 doses.) Associated symptoms: nausea   Associated symptoms: no abdominal pain, no fever, no joint pain, no shortness of breath, no sore throat and not vomiting     Past Medical History:  Diagnosis Date  . Arthritis   . Bladder cancer [facility]) urologist-- dr Louis Meckel   11/ 2019 recurrent   . Cancer of renal pelvis, right [facility]    oncologist-  dr [name]--  high grade papillary urothelial carcinoma, Stage III (T3 Nx M0) -- 02-12-2017  s/p  left nephrectomy--- completed chemotherapy 07-27-2017  . Chemotherapy-induced neuropathy (HCC)    feet  . CKD (chronic kidney disease) stage 3, GFR 30-59 ml/min (HCC)   . Coronary artery disease    followed by Dr. [name]; Lexiscan Myoview (10/14):  Low risk, EF 57%, small inf infarct  . Coronary vasospasm ([place]) PER DR ROSS NOTE 03-22-2012--  INTER. TIGHTNESS   PER PT PROBABLE FROM STRESS  . Depression    no rx  . Dyslipidemia    pt unable to tolerate niaspan  . GAD (generalized anxiety disorder)   . GERD (gastroesophageal reflux disease)   . Hearing loss    per pt due to chemotherapy  . History of  angina CHRONIC -- CONTROLLED W/ IMDUR  . History of cancer chemotherapy    completed 07-27-2017  for renal carcinoma  . 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 History of non-ST elevation myocardial infarction (NSTEMI) 2005  . Hyperlipidemia   . Hypertension   . Nocturia more than twice per night   . OSA on CPAP   . Solitary right kidney    s/p left nephrectomy 02-12-2017  . Type 2 diabetes mellitus treated with insulin [facility])    endocrinologist-- dr Steffanie Dunn ([place] in [place])  lov note in epic   . Urgency of urination   . Wears glasses     Patient Active Problem List   Diagnosis Date Noted  . Elevated serum creatinine 03/21/2018  . IDA (iron deficiency anemia) 06/09/2017  . Cancer of renal pelvis, right ([place]) 03/18/2017  . Goals of care, counseling/discussion 03/18/2017  . Renal mass 02/12/2017  . Non-ST elevation MI (NSTEMI) ([place]) 10/18/2016  . Peripheral neuropathy   . OSA on CPAP   . GERD (gastroesophageal reflux disease)   . Type 2 diabetes mellitus ([place])   . Bladder cancer ([place])   . Gout 10/22/2015  . History of adenomatous polyp of colon 09/07/2013  . Anxiety and depression 06/19/2011  . History of skin cancer 06/19/2011  .  Hyperlipidemia 05/10/2008  . CAD, NATIVE VESSEL 05/10/2008  . RESTLESS LEGS SYNDROME 11/17/2007  . Essential hypertension 11/16/2007    Past Surgical History:  Procedure Laterality Date  . APPENDECTOMY  08-03-2004  . CARDIAC CATHETERIZATION  x3  last one 06-17-2007   MILD NON-OBSTRUCTIVE CAD/ NORMAL LVF/ 30% LEFT RENAL ARTERY STENOSIS  . CARDIOVASCULAR STRESS TEST  02/23/2013   Low risk nuclear study w/ small inferior wall infarct at mid and basal level with no ischemia/  normal LV function and wall motion , ef 57%  . CYSTO/ BILATERAL RETROGRADE PYELOGRAM/ RIGHT URETEROSCOPIC LASER FULGURATION URETERAL TUMOR  02-16-2008  . CYSTO/ BLADDER BX  09-13-2008;  08-13-2007; 04-19-2007; 06-01-2006  .  CYSTO/ CYSTOGRAM/ URETEROSCOPY  02-21-2009  . CYSTO/ RESECTION BLADDER TUMOR/ LEFT RETROGRADE PYELOGRAM/ RIGHT URETEROSCOPY  08-07-2010   TCC OF BLADDER   S/P DISTAL URETERECTOMY/ REIMPLANTATION  . CYSTO/ RIGHT URETEROSCOPY/ BX URETERAL TUMOR  10-11-2008  . CYSTOSCOPY W/ RETROGRADES  04/19/2012   Procedure: CYSTOSCOPY WITH RETROGRADE PYELOGRAM;  Surgeon: Bernestine Amass, MD;  [facility];  Service: Urology;  Laterality: Bilateral;  30 MINS Cysto, Biopsy, possible TURBT, Bilateral retrograde pyelograms with Mitomycin C instilation Post op   . CYSTOSCOPY W/ RETROGRADES Left 03/25/2018   Procedure: CYSTOSCOPY WITH RETROGRADE PYELOGRAM;  Surgeon: [name], MD;  Location: [facility];  Service: Urology;  Laterality: Left;  . CYSTOSCOPY WITH BIOPSY  04/07/2011   Procedure: CYSTOSCOPY WITH BIOPSY/ RIGHT URETEROSCOPY;  Surgeon: Bernestine Amass, MD;  Location[facility];  Service: Urology;  Laterality: N/A;    cystoscopy, cystogram, biopsy and fulgeration  . CYSTOSCOPY WITH BIOPSY  12/29/2011   Procedure: CYSTOSCOPY WITH BIOPSY;  Surgeon: Bernestine Amass, MD;  Location: [facility];  Service: Urology;  Laterality: N/A;  [place]    . CYSTOSCOPY WITH RETROGRADE PYELOGRAM, URETEROSCOPY AND STENT PLACEMENT Left 04/28/2018   Procedure: LEFT RETROGRADE PYELOGRAM, DIAGNOSTIC LEFT URETEROSCOPY;  Surgeon: [name], MD;  Location: [facility];  Service: Urology;  Laterality: Left;  . ELBOW SURGERY  07-07-2003   RIGHT ELBOW ARTHROSCOPY W/ OPEN RECONSTRUCTION  . IR FLUORO GUIDE PORT INSERTION RIGHT  04/01/2017  . IR US GUIDE VASC ACCESS RIGHT  04/01/2017  . KNEE ARTHROSCOPY W/ DEBRIDEMENT  02-08-2004   RIGHT KNEE  . RHINOPLASTY W/ MODIFIED CARTILAGE GRAFT  05-17-2007   INTERNAL NASAL VALVE COLLAPSE/ OSA/ SEPTAL PERFERATION  . RIGHT DISTAL URETERECTOMY W/ REIMPLANTATION  10-30-2008   TCC OF RIGHT DISTAL URETER   . ROBOT ASSITED LAPAROSCOPIC NEPHROURETERECTOMY Right 02/12/2017   Procedure: XI ROBOT ASSITED LAPAROSCOPIC NEPHROURETERECTOMY;  Surgeon: [name], MD;  Location: [facility];  Service: Urology;  Laterality: Right;  . SHOULDER SURGERY  2005   RIGHT ROTATOR CUFF REPAIR  . TOTAL KNEE ARTHROPLASTY Left 05/18/2015  . TOTAL KNEE ARTHROPLASTY Left 05/18/2015   Procedure: TOTAL KNEE ARTHROPLASTY;  Surgeon: Earlie Server, MD;  Location: [facility];  Service: Orthopedics;  Laterality: Left;  . TOTAL KNEE ARTHROPLASTY Right 03/21/2016   Procedure: TOTAL KNEE ARTHROPLASTY;  Surgeon: Earlie Server, MD;  Location: [facility];  Service: Orthopedics;  Laterality: Right;  . TRANSTHORACIC ECHOCARDIOGRAM  02/03/2017   ef 89-16%, grade 1 diastolic dysfunction/  trivial PR  . TRANSURETHRAL RESECTION OF BLADDER TUMOR  01-13-2007  . TRANSURETHRAL RESECTION OF BLADDER TUMOR  04/19/2012   Procedure: TRANSURETHRAL RESECTION OF BLADDER TUMOR (TURBT);  Surgeon: Bernestine Amass, MD;  Location: [facility];  Service: Urology;  Laterality: N/A;  . TRANSURETHRAL RESECTION OF BLADDER TUMOR N/A 03/25/2018   Procedure: TRANSURETHRAL RESECTION OF BLADDER TUMOR (TURBT);  Surgeon: [name], MD;  Location: [facility];  Service: Urology;  Laterality: N/A;  . TRANSURETHRAL RESECTION OF BLADDER TUMOR N/A 04/28/2018   Procedure: TRANSURETHRAL RESECTION OF BLADDER TUMOR (TURBT);  Surgeon: [name], MD;  Location[facility];  Service: Urology;  Laterality: N/A;  . umbillical hernia repair  2004        Home Medications    Prior to Admission medications   Medication Sig Start Date End Date Taking? Authorizing Provider  acetaminophen (TYLENOL) 650 MG CR tablet Take 1,300 mg by mouth every 8 (eight) hours as needed for fever (muscle aches).   Yes [provider]  ALPRAZolam Duanne Moron) 1 MG tablet Take 1 tablet (1 mg total) by mouth 3 (three) times daily as needed for anxiety. 07/06/18   Yes [name], MD  amLODipine (NORVASC) 10 MG tablet Take 10 mg by mouth daily.    Yes [provider]  aspirin EC 81 MG tablet Take 81 mg by mouth at bedtime.    Yes [provider]  CAPSAICIN EX Apply 1 application topically 2 (two) times daily as needed (pain).   Yes [provider]  diazepam (VALIUM) 10 MG tablet Take 10 mg by mouth See admin instructions. Take one tablet (10 mg) by mouth one hour prior to bladder procedure or BCG 07/16/18  Yes [provider]  gabapentin (NEURONTIN) 300 MG capsule Take 300 mg by mouth 2 (two) times daily.   Yes [provider]  glimepiride (AMARYL) 2 MG tablet TAKE 1 BY MOUTH DAILY BEFORE SUPPER Patient taking differently: Take 2 mg by mouth 2 (two) times daily before a meal.  11/28/15  Yes Elayne Snare, MD  HYDROcodone-acetaminophen (NORCO/VICODIN) 5-325 MG tablet Take 1 tablet by mouth every 6 (six) hours as needed for moderate pain.   Yes [provider]  Insulin Glargine, 1 Unit Dial, (TOUJEO SOLOSTAR) 300 UNIT/ML SOPN Inject 50 Units into the skin at bedtime.    Yes [provider]  isosorbide mononitrate (IMDUR) 120 MG 24 hr tablet TAKE 1 TABLET BY MOUTH DAILY. GENERIC EQUIVALENT FOR IMDUR Patient taking differently: Take 120 mg by mouth daily.  10/28/17  Yes [name], MD  meloxicam (MOBIC) 15 MG tablet Take 15 mg by mouth daily as needed for pain.  06/03/18  Yes [provider]  metoprolol succinate (TOPROL-XL) 25 MG 24 hr tablet TAKE 1 TABLET BY MOUTH AT BEDTIME. GENERIC EQUIVALENT FOR TOPROL XL Patient taking differently: Take 25 mg by mouth at bedtime.  07/12/18  Yes [name], MD  Multiple Vitamin (MULTIVITAMIN WITH MINERALS) TABS tablet Take 1 tablet by mouth daily. Centrum Silver   Yes [provider]  nitroGLYCERIN (NITROSTAT) 0.4 MG SL tablet Place 1 tablet (0.4 mg total) under the tongue every 5 (five) minutes as needed for chest pain. 11/29/15  Yes [name], MD  ondansetron (ZOFRAN) 8 MG tablet Take 1 tablet (8 mg total) 2 (two) times daily as needed by mouth. Start on the third day after cisplatin chemotherapy. Patient taking differently: Take 8 mg by mouth every 8 (eight) hours as needed for nausea or vomiting.  08/28/17  Yes Ennever, Rudell Cobb, MD  pantoprazole (PROTONIX) 40 MG tablet TAKE 1 TABLET BY MOUTH EVERY MORNING Patient taking differently: Take 40 mg by mouth daily.  10/28/17  Yes [name], MD  PRESCRIPTION MEDICATION Inhale into the lungs at bedtime. CPAP   Yes [provider]  Pyridoxine HCl (VITAMIN B6) 200 MG TABS Take 200 mg by mouth daily. 03/24/18  Yes [provider]  rOPINIRole (REQUIP) 2 MG tablet Take 1 tablet (2 mg total) by mouth at bedtime. 02/04/18  Yes [name], [name], MD  rosuvastatin (CRESTOR) 10 MG tablet TAKE 1 TAB (10 MG) BY MOUTH EVERY OTHER DAY Patient taking differently: Take 10 mg by mouth every other day.  05/11/18  Yes [name], MD  traMADol (ULTRAM) 50 MG tablet Take 1-2 tablets (50-100 mg total) by mouth every 6 (six) hours as needed for moderate pain. 04/28/18  Yes [name], MD  valACYclovir (VALTREX) 1000 MG tablet Take 1,000 mg by mouth See admin instructions. Start date 07/17/18: take one tablet (1000 mg) by mouth three times daily 1st day, then take one tablet (1000 mg) twice daily until gone 07/17/18  Yes [provider]  phenazopyridine (PYRIDIUM) 200 MG tablet Take 1 tablet (200 mg total) by mouth 3 (three) times daily as needed for pain. Patient not taking: Reported on 07/18/2018 04/28/18   [name], MD  prochlorperazine (COMPAZINE) 10 MG tablet Take 1 tablet (10 mg total) by mouth every 6 (six) hours as needed (Nausea or vomiting). Patient not taking: Reported on 04/20/2018 08/03/17   [name], MD  Pyridoxine HCl (VITAMIN B-6) 500 MG tablet Take 1 tablet (500 mg total) by mouth daily. Patient not taking: Reported on 07/18/2018 07/06/18    [name], MD  valACYclovir (VALTREX) 1000 MG tablet Take 1 tablet (1,000 mg total) by mouth 2 (two) times daily for 5 days. 07/18/18 07/23/18  Orson Aloe, MD    Family History Family History  Problem Relation Age of Onset  . Coronary artery disease Father   . Diabetes Maternal Grandmother   . Heart disease Paternal Grandfather   . Colon cancer Neg Hx   . Stomach cancer Neg Hx   . Esophageal cancer Neg Hx   . Rectal cancer Neg Hx     Social History Social History   Tobacco Use  . Smoking status: Former Smoker    Years: 20.00    Types: Cigarettes    Last attempt to quit: 05/13/2007    Years since quitting: 11.1  . Smokeless tobacco: Never Used  Substance Use Topics  . Alcohol use: Yes    Alcohol/week: 3.0 - 4.0 standard drinks    Types: 3 - 4 Cans of beer per week    Comment: social  . Drug use: No     Allergies   Dilaudid [hydromorphone] and Hydrocodone   Review of Systems Review of Systems  Constitutional: Negative for chills and fever.  HENT: Negative for ear pain and sore throat.   Eyes: Negative for pain and visual disturbance.  Respiratory: Negative for cough and shortness of breath.   Cardiovascular: Negative for chest pain and palpitations.  Gastrointestinal: Positive for nausea. Negative for abdominal pain and vomiting.  Genitourinary: Negative for dysuria and hematuria.  Musculoskeletal: Negative for arthralgias and back pain.  Skin: Positive for rash. Negative for color change.  Neurological: Negative for seizures and syncope.  Psychiatric/Behavioral: Negative for agitation.  All other systems reviewed and are negative.    Physical Exam Updated Vital Signs BP (!) 142/92   Pulse 81   Resp 13   Ht 5\' 10"  (1.778 m)   Wt  92.5 kg   SpO2 95%   BMI 29.27 kg/m   Physical Exam Vitals signs and nursing note reviewed.  Constitutional:      Appearance: He is well-developed.  HENT:     Head: Normocephalic and atraumatic.  Eyes:      Extraocular Movements: Extraocular movements intact.     Conjunctiva/sclera: Conjunctivae normal.  Neck:     Musculoskeletal: Neck supple. No neck rigidity.  Cardiovascular:     Rate and Rhythm: Normal rate and regular rhythm.     Heart sounds: No murmur.  Pulmonary:     Effort: Pulmonary effort is normal. No respiratory distress.     Breath sounds: Normal breath sounds.  Abdominal:     Palpations: Abdomen is soft.     Tenderness: There is no abdominal tenderness.  Musculoskeletal: Normal range of motion.  Skin:    General: Skin is warm and dry.     Findings: Rash present.     Comments: Shingles on the right upper extremity; intact vesicles.  Neurological:     General: No focal deficit present.     Mental Status: He is alert.  Psychiatric:        Mood and Affect: Mood normal.      ED Treatments / Results  Labs (all labs ordered are listed, but only abnormal results are displayed) Labs Reviewed  COMPREHENSIVE METABOLIC PANEL - Abnormal; Notable for the following components:      Result Value   Sodium 132 (*)    Glucose, Bld 264 (*)    Creatinine, Ser 2.13 (*)    Calcium 8.5 (*)    Total Protein 6.3 (*)    AST 55 (*)    ALT 50 (*)    GFR calc non Af Amer 32 (*)    GFR calc Af Amer 37 (*)    All other components within normal limits  CBC WITH DIFFERENTIAL/PLATELET - Abnormal; Notable for the following components:   WBC 3.5 (*)    RBC 3.89 (*)    Hemoglobin 11.9 (*)    HCT 36.4 (*)    Platelets 105 (*)    All other components within normal limits    EKG None  Radiology No results found.  Procedures Procedures (including critical care time)  Medications Ordered in ED Medications  ondansetron (ZOFRAN-ODT) disintegrating tablet 4 mg (has no administration in time range)  sodium chloride flush (NS) 0.9 % injection 10-40 mL (has no administration in time range)  HYDROmorphone (DILAUDID) injection 1 mg (has no administration in time range)  heparin lock flush  100 unit/mL (has no administration in time range)  lactated ringers bolus 1,000 mL (0 mLs Intravenous Stopped 07/18/18 2152)  acetaminophen (TYLENOL) tablet 1,000 mg (1,000 mg Oral Given 07/18/18 2043)  HYDROmorphone (DILAUDID) injection 1 mg (1 mg Intravenous Given 07/18/18 2043)  ondansetron (ZOFRAN) injection 4 mg (4 mg Intravenous Given 07/18/18 2043)     Initial Impression / Assessment and Plan / ED Course  I have reviewed the triage vital signs and the nursing notes.  Pertinent labs & imaging results that were available during my care of the patient were reviewed by me and considered in my medical decision making (see chart for details).        [name] is a very pleasant 63 yo caucasian gentleman with infiltrating high grade papillary urothelial carcinoma with right nephrectomy in October 2018.  Who presents with shingles which started over the last few days, patient started on Valtrex yesterday.  Patient denies any other significant symptoms at this time.  However home health nurse saw the patient and indicated that since the patient started Valtrex yesterday and that the rash spread some more today he should be evaluated in the emergency department due to his cancer history.  Physical exam consistent with shingles outbreak in the right upper extremity.  No systemic spread.  Likely appropriate distribution based on onset and only 1 day of Valtrex.  However will obtain basic laboratory studies to assess for neutropenia.  Patient's laboratory studies appropriate.  Do not feel other imaging or admission is necessary at this time.  Will change Valtrex to twice daily based on kidney function. Given informational material at time of discharge.  Family and patient in agreement with this plan.  Strict return precautions given.  The above care was discussed and agreed upon by my attending physician.  Final Clinical Impressions(s) / ED Diagnoses   Final diagnoses:  Herpes zoster without  complication    ED Discharge Orders         Ordered    valACYclovir (VALTREX) 1000 MG tablet  2 times daily     07/18/18 2031           Orson Aloe, MD 07/18/18 2202    Lajean Saver, MD 07/18/18 2217

## 2018-07-19 ENCOUNTER — Other Ambulatory Visit: Payer: Self-pay

## 2018-07-19 MED ORDER — METOPROLOL SUCCINATE ER 25 MG PO TB24: 25.0000 mg | ORAL_TABLET | Freq: Every day | ORAL | 3 refills | Status: DC

## 2018-07-28 DIAGNOSIS — Z5111 Encounter for antineoplastic chemotherapy: Secondary | ICD-10-CM | POA: Diagnosis not present

## 2018-07-28 DIAGNOSIS — C678 Malignant neoplasm of overlapping sites of bladder: Secondary | ICD-10-CM | POA: Diagnosis not present

## 2018-08-04 DIAGNOSIS — Z5111 Encounter for antineoplastic chemotherapy: Secondary | ICD-10-CM | POA: Diagnosis not present

## 2018-08-04 DIAGNOSIS — C678 Malignant neoplasm of overlapping sites of bladder: Secondary | ICD-10-CM | POA: Diagnosis not present

## 2018-08-05 ENCOUNTER — Telehealth: Payer: Self-pay | Admitting: *Deleted

## 2018-08-05 DIAGNOSIS — B0229 Other postherpetic nervous system involvement: Secondary | ICD-10-CM

## 2018-08-05 MED ORDER — GABAPENTIN 300 MG PO CAPS: 300.0000 mg | ORAL_CAPSULE | Freq: Three times a day (TID) | ORAL | 3 refills | Status: DC

## 2018-08-05 MED FILL — GABAPENTIN 300 MG CAPSULE: 300 | 30 days supply | Qty: 90 | Fill #0

## 2018-08-05 NOTE — Telephone Encounter (Signed)
Patient was recently diagnosed with shingles and c/o significant nerve pain interfering with sleep and ADLs. He has been on Neurontin in the past, and wants to know if he can be prescribed again.  Reviewed with Dr [name]. A new prescription will be sent. Patient is aware of prescription and pharmacy confirmed.

## 2018-08-11 ENCOUNTER — Telehealth: Payer: Self-pay | Admitting: *Deleted

## 2018-08-11 MED ORDER — LIDOCAINE 5 % EX PTCH: 1.0000 | MEDICATED_PATCH | CUTANEOUS | 0 refills | Status: DC

## 2018-08-11 NOTE — Telephone Encounter (Signed)
S/w pt who advised he is having arm pain from shingles. Pt taking gabapentin and lyrica 1 am and 1 hs. Reviewed with pt the prior instructions with Gabapentin 300mg  TID rx sent on 08/06/18. Reviewed with MD, instructed pt to take only Gabapentin, not Lyrica; 1 tablet TID and 2 tablets with the HS dose. Lidoderm patch called into [facility] per pt request. Pt verbalized understanding.

## 2018-08-16 ENCOUNTER — Other Ambulatory Visit: Payer: Self-pay | Admitting: Internal Medicine

## 2018-08-17 ENCOUNTER — Other Ambulatory Visit: Payer: Self-pay

## 2018-08-17 ENCOUNTER — Inpatient Hospital Stay: Payer: Medicare Other | Attending: Hematology & Oncology

## 2018-08-17 VITALS — BP 139/76 | HR 79 | Temp 99.2°F | Resp 18

## 2018-08-17 DIAGNOSIS — Z95828 Presence of other vascular implants and grafts: Secondary | ICD-10-CM

## 2018-08-17 DIAGNOSIS — Z8553 Personal history of malignant neoplasm of renal pelvis: Secondary | ICD-10-CM | POA: Insufficient documentation

## 2018-08-17 DIAGNOSIS — B0229 Other postherpetic nervous system involvement: Secondary | ICD-10-CM

## 2018-08-17 MED ORDER — SODIUM CHLORIDE 0.9% FLUSH
10.0000 mL | INTRAVENOUS | Status: DC | PRN
  Administered 2018-08-17: 10 mL via INTRAVENOUS
  Filled 2018-08-17: qty 10

## 2018-08-17 MED ORDER — HEPARIN SOD (PORK) LOCK FLUSH 100 UNIT/ML IV SOLN
500.0000 [IU] | Freq: Once | INTRAVENOUS | Status: AC
  Administered 2018-08-17: 500 [IU] via INTRAVENOUS
  Filled 2018-08-17: qty 5

## 2018-08-17 MED ORDER — GABAPENTIN 800 MG PO TABS: 800.0000 mg | ORAL_TABLET | Freq: Three times a day (TID) | ORAL | 4 refills | Status: DC

## 2018-08-17 MED FILL — GABAPENTIN 800 MG TABS: 800 | 30 days supply | Qty: 90 | Fill #0

## 2018-08-17 NOTE — Patient Instructions (Signed)

## 2018-09-03 ENCOUNTER — Telehealth: Payer: Self-pay | Admitting: Internal Medicine

## 2018-09-03 ENCOUNTER — Ambulatory Visit (INDEPENDENT_AMBULATORY_CARE_PROVIDER_SITE_OTHER): Payer: Medicare Other | Admitting: Internal Medicine

## 2018-09-03 ENCOUNTER — Encounter: Payer: Self-pay | Admitting: Internal Medicine

## 2018-09-03 VITALS — BP 121/81 | HR 81 | Ht 69.0 in | Wt 204.0 lb

## 2018-09-03 DIAGNOSIS — B0229 Other postherpetic nervous system involvement: Secondary | ICD-10-CM | POA: Diagnosis not present

## 2018-09-03 MED ORDER — GABAPENTIN 800 MG PO TABS: 800.0000 mg | ORAL_TABLET | Freq: Three times a day (TID) | ORAL | 1 refills | Status: DC

## 2018-09-03 MED ORDER — OXYCODONE-ACETAMINOPHEN 10-325 MG PO TABS: 1.0000 | ORAL_TABLET | Freq: Four times a day (QID) | ORAL | 0 refills | Status: AC | PRN

## 2018-09-03 MED FILL — OXYCODONE-APAP 10-325: 10-325 | 7 days supply | Qty: 28 | Fill #0

## 2018-09-03 NOTE — Telephone Encounter (Signed)
ok 

## 2018-09-03 NOTE — Telephone Encounter (Signed)
Scheduled Virtual Visit °

## 2018-09-03 NOTE — Telephone Encounter (Signed)
Called to speak with Isaac [name] about setting up his CPE and AWV and he told me he is in a lot of pain with shingles, so I offered virtual visit to talk with Dr Renold Genta and he would like to do that. Then we will schedule other appointments

## 2018-09-09 ENCOUNTER — Encounter: Payer: Self-pay | Admitting: Internal Medicine

## 2018-09-09 NOTE — Progress Notes (Signed)
   Subjective:    Patient ID: Isaac [name], male    DOB: 1955/07/30, 63 y.o.   MRN: [id number]  HPI 63 year old Male seen today via interactive audio and video telecommunications due to the coronavirus pandemic.Marland Kitchen  He gives consent to visit in this format.  He is identified by 2 identifiers is Isaac [name].[name], a longstanding patient in this practice.  Patient has a history of bladder cancer, diabetes mellitus, coronary artery disease with history of MI, adenomatous colon polyps, hyperlipidemia.  History of obesity GE reflux, sleep apnea and essential hypertension.  He also has restless leg syndrome.  He receives IV chemotherapy for bladder cancer.  In March he was in the emergency department with rash affecting his right shoulder right upper arm right forearm and right hand.  It was diagnosed as Herpes zoster.  He has continued with considerable pain in that area.  He is on Neurontin for peripheral neuropathy prior to this episode of Herpes zoster.  Pain medication helps a little bit.  He says that the pain is very aggravating and miserable.  He does take Xanax for anxiety  We can increase gabapentin to 800 mg 3 times a day.  He can continue with Xanax up to 3 times a day for anxiety.  He also is on Requip for restless leg syndrome.  Explained to him that this is postherpetic neuralgia and it might go on for several months.  Gabapentin is providing treatment for this.    Review of Systems see above     Objective:   Physical Exam  I do not see an active rash at present time along his right shoulder and arm      Assessment & Plan:  Postherpetic neuralgia  Plan: Gabapentin 800 mg 3 times a day.  Expect symptoms to resolve in a few weeks but it may take a while.  Anxiety can be treated with Xanax.

## 2018-09-09 NOTE — Patient Instructions (Signed)
Gabapentin 800 mg 3 times a day

## 2018-09-10 ENCOUNTER — Ambulatory Visit: Payer: Medicare Other | Admitting: Internal Medicine

## 2018-09-21 MED FILL — GABAPENTIN 800 MG TABS: 800 | 40 days supply | Qty: 120 | Fill #0

## 2018-09-22 MED FILL — OXYCODONE-APAP 10-325: 10-325 | 1 days supply | Qty: 2 | Fill #1

## 2018-09-26 ENCOUNTER — Other Ambulatory Visit: Payer: Self-pay | Admitting: Internal Medicine

## 2018-09-27 NOTE — Telephone Encounter (Signed)
°*  STAT* If patient is at the pharmacy, call can be transferred to refill team.   1. Which medications need to be refilled? (please list name of each medication and dose if known Amlodipine and Crestor  2. Which pharmacy/location (including street and city if local pharmacy) is medication to be sent to? [facility]   3. Do they need a 30 day or 90 day supply? 90 and refills

## 2018-09-28 NOTE — Telephone Encounter (Signed)
Hi Michalene, I sent this message to Dr. Harrington Challenger as well. Is this rx ok to refill? Thanks

## 2018-09-28 NOTE — Telephone Encounter (Signed)
Hello, just wanted to know if it's ok to fill this medication?  thanks

## 2018-09-29 ENCOUNTER — Ambulatory Visit (HOSPITAL_COMMUNITY)
Admission: RE | Admit: 2018-09-29 | Discharge: 2018-09-29 | Disposition: A | Discharge status: Home / Self Care | Payer: Medicare Other | Source: Ambulatory Visit | Attending: Hematology & Oncology | Admitting: Hematology & Oncology

## 2018-09-29 ENCOUNTER — Telehealth: Payer: Self-pay | Admitting: *Deleted

## 2018-09-29 ENCOUNTER — Other Ambulatory Visit: Payer: Self-pay

## 2018-09-29 DIAGNOSIS — C651 Malignant neoplasm of right renal pelvis: Secondary | ICD-10-CM | POA: Diagnosis not present

## 2018-09-29 DIAGNOSIS — I2510 Atherosclerotic heart disease of native coronary artery without angina pectoris: Secondary | ICD-10-CM | POA: Diagnosis not present

## 2018-09-29 DIAGNOSIS — I700 Atherosclerosis of aorta: Secondary | ICD-10-CM | POA: Insufficient documentation

## 2018-09-29 DIAGNOSIS — Z905 Acquired absence of kidney: Secondary | ICD-10-CM | POA: Insufficient documentation

## 2018-09-29 DIAGNOSIS — K760 Fatty (change of) liver, not elsewhere classified: Secondary | ICD-10-CM | POA: Diagnosis not present

## 2018-09-29 LAB — GLUCOSE, CAPILLARY: Glucose-Capillary: 200 mg/dL — ABNORMAL HIGH (ref 70–99)

## 2018-09-29 MED ORDER — FLUDEOXYGLUCOSE F - 18 (FDG) INJECTION
10.1000 | Freq: Once | INTRAVENOUS | Status: AC | PRN
  Administered 2018-09-29: 10.1 via INTRAVENOUS

## 2018-09-29 MED ORDER — ROSUVASTATIN CALCIUM 10 MG PO TABS: ORAL_TABLET | ORAL | 1 refills | Status: DC

## 2018-09-29 NOTE — Telephone Encounter (Signed)
Spoke with patient re: refilling meds.  He is overdue for follow up. Scheduled virtual VIDEO visit with Dr. Harrington Challenger 11/19/18. Consent sent via [place].  Has IPHONE use mobile number: [phone].

## 2018-09-29 NOTE — Telephone Encounter (Signed)
Spoke with patient.  He has been taking amlodipine 10 mg daily.  Had briefly cut back to 5 mg last March while on chemo.  He is feeling ok, dealing with neuralgia from shingles 2 months ago.  Has PET scan today.  Continues Crestor every other day. Refilled meds. Scheduled virtual visit with Dr. Harrington Challenger 11/19/18.

## 2018-09-29 NOTE — Telephone Encounter (Signed)
Left message for patient to call back. At last OV (07/2017) with Dr. Harrington Challenger, pt was instructed to decrease dose of amlodipine to 5 mg daily and to follow up in 10/2017.   After that his medication was ordered again as 10 mg by a different provider.   Pt does not have cardiology follow up scheduled.  I asked him to call back to review his BP, current dose of amlodipine and to schedule a virtual visit with Dr. Harrington Challenger.

## 2018-09-29 NOTE — Telephone Encounter (Signed)
Follow Up:; ° ° °Returning your call. °

## 2018-09-30 ENCOUNTER — Telehealth: Payer: Self-pay | Admitting: *Deleted

## 2018-09-30 NOTE — Telephone Encounter (Signed)
-----   Message from [name], MD sent at 09/30/2018  3:31 PM EDT ----- Call - the PET scan is negative for any obvious metastatic disease!!!  Laurey Arrow

## 2018-09-30 NOTE — Telephone Encounter (Signed)
Unable to reach pt, lmovm with results and requested call back to confirm message rcvd.

## 2018-10-06 ENCOUNTER — Other Ambulatory Visit: Payer: Self-pay

## 2018-10-06 ENCOUNTER — Inpatient Hospital Stay: Payer: Medicare Other

## 2018-10-06 ENCOUNTER — Encounter: Payer: Self-pay | Admitting: Hematology & Oncology

## 2018-10-06 ENCOUNTER — Inpatient Hospital Stay: Payer: Medicare Other | Attending: Hematology & Oncology | Admitting: Hematology & Oncology

## 2018-10-06 VITALS — BP 131/83 | HR 62 | Temp 98.9°F | Resp 18 | Wt 210.0 lb

## 2018-10-06 DIAGNOSIS — Z79899 Other long term (current) drug therapy: Secondary | ICD-10-CM | POA: Diagnosis not present

## 2018-10-06 DIAGNOSIS — Z7982 Long term (current) use of aspirin: Secondary | ICD-10-CM | POA: Insufficient documentation

## 2018-10-06 DIAGNOSIS — F419 Anxiety disorder, unspecified: Secondary | ICD-10-CM

## 2018-10-06 DIAGNOSIS — C651 Malignant neoplasm of right renal pelvis: Secondary | ICD-10-CM

## 2018-10-06 DIAGNOSIS — R112 Nausea with vomiting, unspecified: Secondary | ICD-10-CM

## 2018-10-06 DIAGNOSIS — E1165 Type 2 diabetes mellitus with hyperglycemia: Secondary | ICD-10-CM | POA: Diagnosis not present

## 2018-10-06 DIAGNOSIS — B0229 Other postherpetic nervous system involvement: Secondary | ICD-10-CM | POA: Diagnosis not present

## 2018-10-06 DIAGNOSIS — Z95828 Presence of other vascular implants and grafts: Secondary | ICD-10-CM

## 2018-10-06 DIAGNOSIS — R300 Dysuria: Secondary | ICD-10-CM | POA: Diagnosis not present

## 2018-10-06 LAB — CMP (CANCER CENTER ONLY)
ALT: 55 U/L — ABNORMAL HIGH (ref 0–44)
AST: 45 U/L — ABNORMAL HIGH (ref 15–41)
Albumin: 4 g/dL (ref 3.5–5.0)
Alkaline Phosphatase: 62 U/L (ref 38–126)
Anion gap: 9 (ref 5–15)
BUN: 17 mg/dL (ref 8–23)
CO2: 25 mmol/L (ref 22–32)
Calcium: 8.4 mg/dL — ABNORMAL LOW (ref 8.9–10.3)
Chloride: 99 mmol/L (ref 98–111)
Creatinine: 1.64 mg/dL — ABNORMAL HIGH (ref 0.61–1.24)
GFR, Est AFR Am: 51 mL/min — ABNORMAL LOW (ref >60)
GFR, Estimated: 44 mL/min — ABNORMAL LOW (ref >60)
Glucose, Bld: 386 mg/dL — ABNORMAL HIGH (ref 70–99)
Potassium: 4.3 mmol/L (ref 3.5–5.1)
Sodium: 133 mmol/L — ABNORMAL LOW (ref 135–145)
Total Bilirubin: 0.7 mg/dL (ref 0.3–1.2)
Total Protein: 6.4 g/dL — ABNORMAL LOW (ref 6.5–8.1)

## 2018-10-06 LAB — CBC WITH DIFFERENTIAL (CANCER CENTER ONLY)
Abs Immature Granulocytes: 0.02 10*3/uL (ref 0.00–0.07)
Basophils Absolute: 0 10*3/uL (ref 0.0–0.1)
Basophils Relative: 1 %
Eosinophils Absolute: 0.1 10*3/uL (ref 0.0–0.5)
Eosinophils Relative: 3 %
HCT: 36.8 % — ABNORMAL LOW (ref 39.0–52.0)
Hemoglobin: 12.6 g/dL — ABNORMAL LOW (ref 13.0–17.0)
Immature Granulocytes: 1 %
Lymphocytes Relative: 33 %
Lymphs Abs: 0.9 10*3/uL (ref 0.7–4.0)
MCH: 32 pg (ref 26.0–34.0)
MCHC: 34.2 g/dL (ref 30.0–36.0)
MCV: 93.4 fL (ref 80.0–100.0)
Monocytes Absolute: 0.3 10*3/uL (ref 0.1–1.0)
Monocytes Relative: 12 %
Neutro Abs: 1.4 10*3/uL — ABNORMAL LOW (ref 1.7–7.7)
Neutrophils Relative %: 50 %
Platelet Count: 119 10*3/uL — ABNORMAL LOW (ref 150–400)
RBC: 3.94 MIL/uL — ABNORMAL LOW (ref 4.22–5.81)
RDW: 13.6 % (ref 11.5–15.5)
WBC Count: 2.8 10*3/uL — ABNORMAL LOW (ref 4.0–10.5)
nRBC: 0 % (ref 0.0–0.2)

## 2018-10-06 MED ORDER — ALPRAZOLAM 1 MG PO TABS: 1.0000 mg | ORAL_TABLET | Freq: Three times a day (TID) | ORAL | 0 refills | Status: DC | PRN

## 2018-10-06 MED ORDER — SODIUM CHLORIDE 0.9% FLUSH
10.0000 mL | Freq: Once | INTRAVENOUS | Status: AC
  Administered 2018-10-06: 10 mL via INTRAVENOUS
  Filled 2018-10-06: qty 10

## 2018-10-06 MED ORDER — HEPARIN SOD (PORK) LOCK FLUSH 100 UNIT/ML IV SOLN
500.0000 [IU] | Freq: Once | INTRAVENOUS | Status: AC
  Administered 2018-10-06: 500 [IU] via INTRAVENOUS
  Filled 2018-10-06: qty 5

## 2018-10-06 MED FILL — ALPRAZolam 1 MG TABS: 1 | 30 days supply | Qty: 90 | Fill #0

## 2018-10-06 NOTE — Progress Notes (Signed)
Hematology and Oncology Follow Up Visit  Isaac [name] [id number] [birth year] 63 y.o. 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 is here today for follow-up.  He is doing okay.  As always, his blood sugars are on the high side.  He has been to have a cystoscopy next week.  He is having some issues with respect to urinating.  We did do a PET scan on him.  This was done on 09/29/2018.  The PET scan did not show any evidence of active metastatic disease.  He has a low level pelvic and inguinal nodes.    He is still bothered by the postherpetic neuralgia in his right arm.  He is on Neurontin 800 mg 3 times a day for this.  He has had no problem cough.  He has had no bleeding.  There is been no problem with bowels.    Overall, his performance status is ECOG 1.   Medications:  Allergies as of 10/06/2018      Reactions   Dilaudid [hydromorphone] Nausea And Vomiting   Hydrocodone Itching, Nausea And Vomiting      Medication List       Accurate as of Oct 06, 2018  1:32 PM. If you have any questions, ask your nurse or doctor.        ALPRAZolam 1 MG tablet Commonly known as:  XANAX Take 1 tablet (1 mg total) by mouth 3 (three) times daily as needed for anxiety.   amLODipine 10 MG tablet Commonly known as:  NORVASC TAKE 1 TABLET BY MOUTH EVERY DAY HOLD FOR SYSTOLIC BLOOD PRESSURE LESS THAN 110   aspirin EC 81 MG tablet Take 81 mg by mouth at bedtime.   diazepam 10 MG tablet Commonly known as:  VALIUM Take 10 mg by mouth See admin instructions. Take one tablet (10 mg) by mouth one hour prior to bladder procedure or BCG   gabapentin 800 MG tablet Commonly known as:  Neurontin Take 1 tablet (800 mg total) by mouth 3 (three) times  daily.   glimepiride 2 MG tablet Commonly known as:  AMARYL TAKE 1 BY MOUTH DAILY BEFORE SUPPER What changed:    how much to take  how to take this  when to take this  additional instructions   isosorbide mononitrate 120 MG 24 hr tablet Commonly known as:  IMDUR TAKE 1 TABLET BY MOUTH DAILY. GENERIC EQUIVALENT FOR IMDUR What changed:  See the new instructions.   metoprolol succinate 25 MG 24 hr tablet Commonly known as:  TOPROL-XL Take 1 tablet (25 mg total) by mouth at bedtime.   multivitamin with minerals Tabs tablet Take 1 tablet by mouth daily. Centrum Silver   nitroGLYCERIN 0.4 MG SL tablet Commonly known as:  NITROSTAT Place 1 tablet (0.4 mg total) under the tongue every 5 (five) minutes as needed for chest pain.   pantoprazole 40 MG tablet Commonly known as:  PROTONIX TAKE 1 TABLET BY MOUTH EVERY MORNING   PRESCRIPTION MEDICATION Inhale into the lungs at bedtime. CPAP   rOPINIRole 2 MG tablet Commonly known as:  REQUIP Take 1 tablet (2 mg total) by mouth at bedtime.   rosuvastatin 10 MG tablet Commonly known as:  CRESTOR TAKE 1 TAB (10 MG) BY MOUTH EVERY OTHER DAY   Toujeo SoloStar 300 UNIT/ML  Sopn Generic drug:  Insulin Glargine (1 Unit Dial) Inject 50 Units into the skin at bedtime.   vitamin B-6 500 MG tablet Take 1 tablet (500 mg total) by mouth daily.       Allergies:  Allergies  Allergen Reactions  . Dilaudid [Hydromorphone] Nausea And Vomiting  . Hydrocodone Itching and Nausea And Vomiting    Past Medical History, Surgical history, Social history, and Family History were reviewed and updated.  Review of Systems: Review of Systems  Constitutional: Negative.   HENT: Positive for tinnitus.   Eyes: Negative.   Respiratory: Negative.   Cardiovascular: Negative.   Gastrointestinal: Positive for nausea.  Genitourinary: Negative.   Musculoskeletal: Negative.   Skin: Negative.   Neurological: Negative.   Endo/Heme/Allergies: Negative.    Psychiatric/Behavioral: Negative.      Physical Exam:  weight is 210 lb (95.3 kg). His oral temperature is 98.9 F (37.2 C). His blood pressure is 131/83 and his pulse is 62. His respiration is 18 and oxygen saturation is 99%.   Wt Readings from Last 3 Encounters:  10/06/18 210 lb (95.3 kg)  09/03/18 204 lb (92.5 kg)  07/18/18 204 lb (92.5 kg)    Physical Exam Vitals signs reviewed.  HENT:     Head: Normocephalic and atraumatic.  Eyes:     Pupils: Pupils are equal, round, and reactive to light.  Neck:     Musculoskeletal: Normal range of motion.  Cardiovascular:     Rate and Rhythm: Normal rate and regular rhythm.     Heart sounds: Normal heart sounds.  Pulmonary:     Effort: Pulmonary effort is normal.     Breath sounds: Normal breath sounds.  Abdominal:     General: Bowel sounds are normal.     Palpations: Abdomen is soft.  Musculoskeletal: Normal range of motion.        General: No tenderness or deformity.  Lymphadenopathy:     Cervical: No cervical adenopathy.  Skin:    General: Skin is warm and dry.     Findings: No erythema or rash.  Neurological:     Mental Status: He is alert and oriented to person, place, and time.  Psychiatric:        Behavior: Behavior normal.        Thought Content: Thought content normal.        Judgment: Judgment normal.     Lab Results  Component Value Date   WBC 2.8 (L) 10/06/2018   HGB 12.6 (L) 10/06/2018   HCT 36.8 (L) 10/06/2018   MCV 93.4 10/06/2018   PLT 119 (L) 10/06/2018   Lab Results  Component Value Date   FERRITIN 52 07/06/2018   IRON 98 07/06/2018   TIBC 371 07/06/2018   UIBC 273 07/06/2018   IRONPCTSAT 27 07/06/2018   Lab Results  Component Value Date   RETICCTPCT 2.2 (H) 10/15/2017   RBC 3.94 (L) 10/06/2018   No results found for: KPAFRELGTCHN, LAMBDASER, [facility] Lab Results  Component Value Date   IGA 232 01/25/2015   No results found for: Kathrynn Ducking, MSPIKE, SPEI   Chemistry      Component Value Date/Time   NA 133 (L) 10/06/2018 1155   NA 134 05/04/2017 0925   K 4.3 10/06/2018 1155   K 4.5 05/04/2017 0925   CL 99 10/06/2018 1155   CL 97 (L) 05/04/2017 0925   CO2 25 10/06/2018 1155   CO2 23 05/04/2017 0925   BUN  17 10/06/2018 1155   BUN 28 (H) 05/04/2017 0925   CREATININE 1.64 (H) 10/06/2018 1155   CREATININE 1.90 (H) 03/09/2018 1034   GLU 86 03/31/2016      Component Value Date/Time   CALCIUM 8.4 (L) 10/06/2018 1155   CALCIUM 9.3 05/04/2017 0925   ALKPHOS 62 10/06/2018 1155   ALKPHOS 58 05/04/2017 0925   AST 45 (H) 10/06/2018 1155   ALT 55 (H) 10/06/2018 1155   ALT 90 (H) 05/04/2017 0925   BILITOT 0.7 10/06/2018 1155      Impression and Plan: [name] is a very pleasant 63 yo caucasian gentleman with infiltrating high grade papillary urothelial carcinoma with right nephrectomy in October 2018.   At this point, we are in a surveillance mode.  I will go ahead and set him up with another PET scan to be done in 3 months.   I will plan to have him come back in 3 months.  We will do the PET scan before I see him back.  He will have his Port-A-Cath flushed in 6 weeks.    [name], MD 5/27/20201:32 PM

## 2018-10-06 NOTE — Patient Instructions (Signed)

## 2018-10-07 LAB — LACTATE DEHYDROGENASE: LDH: 182 U/L (ref 98–192)

## 2018-10-13 DIAGNOSIS — E119 Type 2 diabetes mellitus without complications: Secondary | ICD-10-CM | POA: Diagnosis not present

## 2018-10-13 DIAGNOSIS — R600 Localized edema: Secondary | ICD-10-CM | POA: Diagnosis not present

## 2018-10-13 DIAGNOSIS — Z794 Long term (current) use of insulin: Secondary | ICD-10-CM | POA: Diagnosis not present

## 2018-10-25 DIAGNOSIS — C678 Malignant neoplasm of overlapping sites of bladder: Secondary | ICD-10-CM | POA: Diagnosis not present

## 2018-10-25 DIAGNOSIS — N410 Acute prostatitis: Secondary | ICD-10-CM | POA: Diagnosis not present

## 2018-10-27 DIAGNOSIS — E119 Type 2 diabetes mellitus without complications: Secondary | ICD-10-CM | POA: Diagnosis not present

## 2018-10-27 DIAGNOSIS — Z794 Long term (current) use of insulin: Secondary | ICD-10-CM | POA: Diagnosis not present

## 2018-10-27 MED FILL — DIAZEPAM 10 MG TABS: 10 | 3 days supply | Qty: 3 | Fill #0

## 2018-10-27 MED FILL — MELOXICAM 15 MG TABLET: 15 | 14 days supply | Qty: 14 | Fill #0

## 2018-10-27 MED FILL — TAMSULOSIN HCL 0.4 MG CAP: 0.4 | 30 days supply | Qty: 30 | Fill #0

## 2018-11-19 ENCOUNTER — Encounter: Payer: Self-pay | Admitting: Internal Medicine

## 2018-11-19 ENCOUNTER — Other Ambulatory Visit: Payer: Self-pay

## 2018-11-19 ENCOUNTER — Telehealth (INDEPENDENT_AMBULATORY_CARE_PROVIDER_SITE_OTHER): Payer: Medicare Other | Admitting: Internal Medicine

## 2018-11-19 VITALS — BP 133/81 | HR 70 | Ht 69.0 in | Wt 210.0 lb

## 2018-11-19 DIAGNOSIS — I2510 Atherosclerotic heart disease of native coronary artery without angina pectoris: Secondary | ICD-10-CM

## 2018-11-19 DIAGNOSIS — I10 Essential (primary) hypertension: Secondary | ICD-10-CM

## 2018-11-19 DIAGNOSIS — E782 Mixed hyperlipidemia: Secondary | ICD-10-CM

## 2018-11-19 DIAGNOSIS — I208 Other forms of angina pectoris: Secondary | ICD-10-CM

## 2018-11-19 NOTE — Progress Notes (Signed)
Virtual Visit via Video Note   This visit type was conducted due to national recommendations for restrictions regarding the COVID-19 Pandemic (e.g. social distancing) in an effort to limit this patient's exposure and mitigate transmission in our community.  Due to his co-morbid illnesses, this patient is at least at moderate risk for complications without adequate follow up.  This format is felt to be most appropriate for this patient at this time.  All issues noted in this document were discussed and addressed.  A limited physical exam was performed with this format.  Please refer to the patient's chart for his consent to telehealth for [facility].   Date:  11/19/2018   ID:  [name], DOB 03/22/1956, MRN [id number]  Patient Location: Home Provider Location: Home  PCP:  Elby Showers, MD  Cardiologist:  [name], MD  Electrophysiologist:  None   Evaluation Performed:  Follow-Up Visit  Chief Complaint:  F/U of CAD  History of Present Illness:    Isaac [name] is a 63 y.o. male with a hx of mild CAD and probable microvascular dz, TIMI II flow in LAD and LCx   He also has a hx of DM, HL, HTN, bladder CAD   Last cath in 2009  Minimal CAD   Myovue in 2014   Low risk I last saw the pt in March 2019   Was going through chemotherapy for renal Ca at that time    In Feb 2020 he awas admitted with the flu   Dizzy mild chest tightness Cardiology saw him this admit  Did not feel cardiac  SInce seen he says he has been doing pretty good   Denies CP   Rare NTG use   Breathing is OK   Does not some LE edema   Mild  Down in am   Up at end of day   No SOB   DOes not watch salt though does not add  The patient does not: have symptoms concerning for COVID-19 infection (fever, chills, cough, or new shortness of breath).    Past Medical History:  Diagnosis Date  . Arthritis   . Bladder cancer [facility]) urologist-- dr Louis Meckel   11/ 2019 recurrent   . Cancer of renal pelvis, right  [facility])    oncologist-  dr [name]--  high grade papillary urothelial carcinoma, Stage III (T3 Nx M0) -- 02-12-2017  s/p  left nephrectomy--- completed chemotherapy 07-27-2017  . Chemotherapy-induced neuropathy (HCC)    feet  . CKD (chronic kidney disease) stage 3, GFR 30-59 ml/min (HCC)   . Coronary artery disease    followed by Dr. [name]; Lexiscan Myoview (10/14):  Low risk, EF 57%, small inf infarct  . Coronary vasospasm ([place]) PER DR  NOTE 03-22-2012--  INTER. TIGHTNESS   PER PT PROBABLE FROM STRESS  . Depression    no rx  . Dyslipidemia    pt unable to tolerate niaspan  . GAD (generalized anxiety disorder)   . GERD (gastroesophageal reflux disease)   . Hearing loss    per pt due to chemotherapy  . History of angina CHRONIC -- CONTROLLED W/ IMDUR  . History of cancer chemotherapy    completed 07-27-2017  for renal carcinoma  . 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 History of non-ST elevation myocardial infarction (NSTEMI) 2005  . Hyperlipidemia   . Hypertension   . Nocturia more than twice per night   . OSA  on CPAP   . Solitary right kidney    s/p left nephrectomy 02-12-2017  . Type 2 diabetes mellitus treated with insulin [facility])    endocrinologist-- dr Steffanie Dunn ([place] in [place])  lov note in epic   . Urgency of urination   . Wears glasses    Past Surgical History:  Procedure Laterality Date  . APPENDECTOMY  08-03-2004  . CARDIAC CATHETERIZATION  x3  last one 06-17-2007   MILD NON-OBSTRUCTIVE CAD/ NORMAL LVF/ 30% LEFT RENAL ARTERY STENOSIS  . CARDIOVASCULAR STRESS TEST  02/23/2013   Low risk nuclear study w/ small inferior wall infarct at mid and basal level with no ischemia/  normal LV function and wall motion , ef 57%  . CYSTO/ BILATERAL RETROGRADE PYELOGRAM/ RIGHT URETEROSCOPIC LASER FULGURATION URETERAL TUMOR  02-16-2008  . CYSTO/ BLADDER BX  09-13-2008;  08-13-2007; 04-19-2007; 06-01-2006  . CYSTO/ CYSTOGRAM/  URETEROSCOPY  02-21-2009  . CYSTO/ RESECTION BLADDER TUMOR/ LEFT RETROGRADE PYELOGRAM/ RIGHT URETEROSCOPY  08-07-2010   TCC OF BLADDER   S/P DISTAL URETERECTOMY/ REIMPLANTATION  . CYSTO/ RIGHT URETEROSCOPY/ BX URETERAL TUMOR  10-11-2008  . CYSTOSCOPY W/ RETROGRADES  04/19/2012   Procedure: CYSTOSCOPY WITH RETROGRADE PYELOGRAM;  Surgeon: Bernestine Amass, MD;  Location[facility];  Service: Urology;  Laterality: Bilateral;  30 MINS Cysto, Biopsy, possible TURBT, Bilateral retrograde pyelograms with Mitomycin C instilation Post op   . CYSTOSCOPY W/ RETROGRADES Left 03/25/2018   Procedure: CYSTOSCOPY WITH RETROGRADE PYELOGRAM;  Surgeon: [name], MD;  [facility];  Service: Urology;  Laterality: Left;  . CYSTOSCOPY WITH BIOPSY  04/07/2011   Procedure: CYSTOSCOPY WITH BIOPSY/ RIGHT URETEROSCOPY;  Surgeon: Bernestine Amass, MD;  Location: [facility];  Service: Urology;  Laterality: N/A;    cystoscopy, cystogram, biopsy and fulgeration  . CYSTOSCOPY WITH BIOPSY  12/29/2011   Procedure: CYSTOSCOPY WITH BIOPSY;  Surgeon: Bernestine Amass, MD;  Location: [facility];  Service: Urology;  Laterality: N/A;  [place]    . CYSTOSCOPY WITH RETROGRADE PYELOGRAM, URETEROSCOPY AND STENT PLACEMENT Left 04/28/2018   Procedure: LEFT RETROGRADE PYELOGRAM, DIAGNOSTIC LEFT URETEROSCOPY;  Surgeon: [name], MD;  Location: [facility];  Service: Urology;  Laterality: Left;  . ELBOW SURGERY  07-07-2003   RIGHT ELBOW ARTHROSCOPY W/ OPEN RECONSTRUCTION  . IR FLUORO GUIDE PORT INSERTION RIGHT  04/01/2017  . IR US GUIDE VASC ACCESS RIGHT  04/01/2017  . KNEE ARTHROSCOPY W/ DEBRIDEMENT  02-08-2004   RIGHT KNEE  . RHINOPLASTY W/ MODIFIED CARTILAGE GRAFT  05-17-2007   INTERNAL NASAL VALVE COLLAPSE/ OSA/ SEPTAL PERFERATION  . RIGHT DISTAL URETERECTOMY W/ REIMPLANTATION  10-30-2008   TCC OF RIGHT DISTAL URETER  . ROBOT ASSITED  LAPAROSCOPIC NEPHROURETERECTOMY Right 02/12/2017   Procedure: XI ROBOT ASSITED LAPAROSCOPIC NEPHROURETERECTOMY;  Surgeon: [name], MD;  Location: [facility];  Service: Urology;  Laterality: Right;  . SHOULDER SURGERY  2005   RIGHT ROTATOR CUFF REPAIR  . TOTAL KNEE ARTHROPLASTY Left 05/18/2015  . TOTAL KNEE ARTHROPLASTY Left 05/18/2015   Procedure: TOTAL KNEE ARTHROPLASTY;  Surgeon: Earlie Server, MD;  Location: [place];  Service: Orthopedics;  Laterality: Left;  . TOTAL KNEE ARTHROPLASTY Right 03/21/2016   Procedure: TOTAL KNEE ARTHROPLASTY;  Surgeon: Earlie Server, MD;  Location: [place];  Service: Orthopedics;  Laterality: Right;  . TRANSTHORACIC ECHOCARDIOGRAM  02/03/2017   ef 22-02%, grade 1 diastolic dysfunction/  trivial PR  . TRANSURETHRAL RESECTION OF BLADDER TUMOR  01-13-2007  . TRANSURETHRAL RESECTION OF BLADDER TUMOR  04/19/2012   Procedure: TRANSURETHRAL RESECTION OF BLADDER TUMOR (TURBT);  Surgeon: Bernestine Amass, MD;  Location[facility];  Service: Urology;  Laterality: N/A;  . TRANSURETHRAL RESECTION OF BLADDER TUMOR N/A 03/25/2018   Procedure: TRANSURETHRAL RESECTION OF BLADDER TUMOR (TURBT);  Surgeon: [name], MD;  Location[facility];  Service: Urology;  Laterality: N/A;  . TRANSURETHRAL RESECTION OF BLADDER TUMOR N/A 04/28/2018   Procedure: TRANSURETHRAL RESECTION OF BLADDER TUMOR (TURBT);  Surgeon: [name], MD;  Location: [facility];  Service: Urology;  Laterality: N/A;  . umbillical hernia repair  2004     Current Meds  Medication Sig  . ALPRAZolam (XANAX) 1 MG tablet Take 1 tablet (1 mg total) by mouth 3 (three) times daily as needed for anxiety.  Marland Kitchen amLODipine (NORVASC) 10 MG tablet TAKE 1 TABLET BY MOUTH EVERY DAY HOLD FOR SYSTOLIC BLOOD PRESSURE LESS THAN 110  . aspirin EC 81 MG tablet Take 81 mg by mouth at bedtime.   . diazepam (VALIUM) 10 MG tablet Take 10 mg by mouth See admin instructions. Take one tablet  (10 mg) by mouth one hour prior to bladder procedure or BCG  . glimepiride (AMARYL) 2 MG tablet TAKE 1 BY MOUTH DAILY BEFORE SUPPER (Patient taking differently: Take 2 mg by mouth 2 (two) times daily before a meal. )  . Insulin Glargine, 1 Unit Dial, (TOUJEO SOLOSTAR) 300 UNIT/ML SOPN Inject 50 Units into the skin at bedtime.   . isosorbide mononitrate (IMDUR) 120 MG 24 hr tablet TAKE 1 TABLET BY MOUTH DAILY. GENERIC EQUIVALENT FOR IMDUR (Patient taking differently: Take 120 mg by mouth daily. )  . liraglutide (VICTOZA) 18 MG/3ML SOPN Inject into the skin daily.  . metoprolol succinate (TOPROL-XL) 25 MG 24 hr tablet Take 1 tablet (25 mg total) by mouth at bedtime.  . Multiple Vitamin (MULTIVITAMIN WITH MINERALS) TABS tablet Take 1 tablet by mouth daily. Centrum Silver  . nitroGLYCERIN (NITROSTAT) 0.4 MG SL tablet Place 1 tablet (0.4 mg total) under the tongue every 5 (five) minutes as needed for chest pain.  . pantoprazole (PROTONIX) 40 MG tablet TAKE 1 TABLET BY MOUTH EVERY MORNING  . PRESCRIPTION MEDICATION Inhale into the lungs at bedtime. CPAP  . rOPINIRole (REQUIP) 2 MG tablet Take 1 tablet (2 mg total) by mouth at bedtime.  . rosuvastatin (CRESTOR) 10 MG tablet TAKE 1 TAB (10 MG) BY MOUTH EVERY OTHER DAY     Allergies:   Hydrocodone and Dilaudid [hydromorphone]   Social History   Tobacco Use  . Smoking status: Former Smoker    Years: 20.00    Types: Cigarettes    Quit date: 05/13/2007    Years since quitting: 11.5  . Smokeless tobacco: Never Used  Substance Use Topics  . Alcohol use: Yes    Alcohol/week: 3.0 - 4.0 standard drinks    Types: 3 - 4 Cans of beer per week    Comment: social  . Drug use: No     Family Hx: The patient's family history includes Coronary artery disease in his father; Diabetes in his maternal grandmother; Heart disease in his paternal grandfather. There is no history of Colon cancer, Stomach cancer, Esophageal cancer, or Rectal cancer.  ROS:   Please  see the history of present illness.     All other systems reviewed and are negative.   Prior CV studies:   The following studies were reviewed  today:    Labs/Other Tests and Data Reviewed:    EKG:  No ECG reviewed.  Recent Labs: 10/06/2018: ALT 55; BUN 17; Creatinine 1.64; Hemoglobin 12.6; Platelet Count 119; Potassium 4.3; Sodium 133   Recent Lipid Panel Lab Results  Component Value Date/Time   CHOL 121 03/09/2018 10:34 AM   TRIG 161 (H) 03/09/2018 10:34 AM   HDL 33 (L) 03/09/2018 10:34 AM   CHOLHDL 3.7 03/09/2018 10:34 AM   LDLCALC 64 03/09/2018 10:34 AM   LDLDIRECT 96.0 07/20/2015 10:35 AM    Wt Readings from Last 3 Encounters:  11/19/18 210 lb (95.3 kg)  10/06/18 210 lb (95.3 kg)  09/03/18 204 lb (92.5 kg)     Objective:    Vital Signs:  BP 133/81   Pulse 70   Ht 5\' 9"  (1.753 m)   Wt 210 lb (95.3 kg)   BMI 31.01 kg/m    VITAL SIGNS:  reviewed  Trivial edema in feet  ASSESSMENT & PLAN:    1. Chest pain  Patient is doing well  Rare use of NTG    FOllow   2   HTN   Blood pressure has been good   3  HL  WIll try to track down lipids from PCP    4   Edema.   PRobably venous insuffiency in setting of salt and abnormal renal function    With Cr in 1.8 range  I would recomm that he watch salt intake more closely    2 to 3 grams per day goal   I am not convinced edema represents CHF   If worsens though, despite watching salt would consider echo     5  Onc   Pt is followed with reglar PET scans    Doing well  Renal function not normal but stable     6  COVID-19 Education: The signs and symptoms of COVID-19 were discussed with the patient and how to seek care for testing (follow up with PCP or arrange E-visit).  The importance of social distancing was discussed today.  Time:   Today, I have spent 25 minutes with the patient with telehealth technology discussing the above problems.     Medication Adjustments/Labs and Tests Ordered: Current medicines are  reviewed at length with the patient today.  Concerns regarding medicines are outlined above.   Tests Ordered: No orders of the defined types were placed in this encounter.   Medication Changes: No orders of the defined types were placed in this encounter.   Follow Up:  In Person March 2021  Signed, [name], MD  11/19/2018 10:12 AM    [place]

## 2018-11-19 NOTE — Patient Instructions (Signed)
Medication Instructions:  No changes If you need a refill on your cardiac medications before your next appointment, please call your pharmacy.   Lab work: none  Testing/Procedures: none  Follow-Up: At [facility], you and your health needs are our priority.  As part of our continuing mission to provide you with exceptional heart care, we have created designated Provider Care Teams.  These Care Teams include your primary Cardiologist (physician) and Advanced Practice Providers (APPs -  Physician Assistants and Nurse Practitioners) who all work together to provide you with the care you need, when you need it. You will need a follow up appointment in:  8 months.  Please call our office 2 months in advance to schedule this appointment.  You may see [name], MD or one of the following Advanced Practice Providers on your designated Care Team: Richardson Dopp, PA-C [place], [place] . Daune Perch, NP  Any Other Special Instructions Will Be Listed Below (If Applicable). I have tried to reach Dr. Verlene Mayer office this morning to see if you have had lipids checked there recently.  If they do not have recent results, we will arrange for you to come to the [place] office at [facility] to have the blood work done.--Caren Hazy, RN

## 2018-12-03 MED FILL — TAMSULOSIN HCL 0.4 MG CAP: 0.4 | 30 days supply | Qty: 30 | Fill #0

## 2018-12-14 ENCOUNTER — Other Ambulatory Visit: Payer: Self-pay | Admitting: Internal Medicine

## 2018-12-24 DIAGNOSIS — E119 Type 2 diabetes mellitus without complications: Secondary | ICD-10-CM | POA: Diagnosis not present

## 2018-12-24 DIAGNOSIS — Z794 Long term (current) use of insulin: Secondary | ICD-10-CM | POA: Diagnosis not present

## 2018-12-28 DIAGNOSIS — E119 Type 2 diabetes mellitus without complications: Secondary | ICD-10-CM | POA: Diagnosis not present

## 2018-12-28 DIAGNOSIS — Z794 Long term (current) use of insulin: Secondary | ICD-10-CM | POA: Diagnosis not present

## 2018-12-31 ENCOUNTER — Telehealth: Payer: Self-pay | Admitting: *Deleted

## 2018-12-31 ENCOUNTER — Encounter (HOSPITAL_COMMUNITY)
Admission: RE | Admit: 2018-12-31 | Discharge: 2018-12-31 | Disposition: A | Discharge status: Home / Self Care | Payer: Medicare Other | Source: Ambulatory Visit | Attending: Hematology & Oncology | Admitting: Hematology & Oncology

## 2018-12-31 ENCOUNTER — Other Ambulatory Visit: Payer: Self-pay

## 2018-12-31 DIAGNOSIS — Z905 Acquired absence of kidney: Secondary | ICD-10-CM | POA: Insufficient documentation

## 2018-12-31 DIAGNOSIS — K760 Fatty (change of) liver, not elsewhere classified: Secondary | ICD-10-CM | POA: Diagnosis not present

## 2018-12-31 DIAGNOSIS — K5730 Diverticulosis of large intestine without perforation or abscess without bleeding: Secondary | ICD-10-CM | POA: Diagnosis not present

## 2018-12-31 DIAGNOSIS — C651 Malignant neoplasm of right renal pelvis: Secondary | ICD-10-CM | POA: Insufficient documentation

## 2018-12-31 LAB — GLUCOSE, CAPILLARY: Glucose-Capillary: 133 mg/dL — ABNORMAL HIGH (ref 70–99)

## 2018-12-31 MED ORDER — FLUDEOXYGLUCOSE F - 18 (FDG) INJECTION
10.4700 | Freq: Once | INTRAVENOUS | Status: AC | PRN
  Administered 2018-12-31: 11:00:00 10.47 via INTRAVENOUS

## 2018-12-31 NOTE — Telephone Encounter (Signed)
-----   Message from [name], MD sent at 12/31/2018  2:24 PM EDT ----- Call - the PET scan is still normal!!!  Laurey Arrow

## 2018-12-31 NOTE — Telephone Encounter (Signed)
Notified pt of results. No concerns at this time. 

## 2019-01-06 DIAGNOSIS — Z794 Long term (current) use of insulin: Secondary | ICD-10-CM | POA: Diagnosis not present

## 2019-01-06 DIAGNOSIS — E119 Type 2 diabetes mellitus without complications: Secondary | ICD-10-CM | POA: Diagnosis not present

## 2019-01-07 ENCOUNTER — Inpatient Hospital Stay: Payer: Medicare Other

## 2019-01-07 ENCOUNTER — Other Ambulatory Visit: Payer: Self-pay

## 2019-01-07 ENCOUNTER — Telehealth: Payer: Self-pay | Admitting: Hematology & Oncology

## 2019-01-07 ENCOUNTER — Inpatient Hospital Stay: Payer: Medicare Other | Attending: Hematology & Oncology | Admitting: Hematology & Oncology

## 2019-01-07 ENCOUNTER — Encounter: Payer: Self-pay | Admitting: Hematology & Oncology

## 2019-01-07 VITALS — BP 121/75 | HR 54 | Temp 97.9°F | Resp 16 | Wt 208.0 lb

## 2019-01-07 DIAGNOSIS — R112 Nausea with vomiting, unspecified: Secondary | ICD-10-CM | POA: Diagnosis not present

## 2019-01-07 DIAGNOSIS — Z7982 Long term (current) use of aspirin: Secondary | ICD-10-CM | POA: Insufficient documentation

## 2019-01-07 DIAGNOSIS — Z8551 Personal history of malignant neoplasm of bladder: Secondary | ICD-10-CM | POA: Insufficient documentation

## 2019-01-07 DIAGNOSIS — E1140 Type 2 diabetes mellitus with diabetic neuropathy, unspecified: Secondary | ICD-10-CM | POA: Insufficient documentation

## 2019-01-07 DIAGNOSIS — F419 Anxiety disorder, unspecified: Secondary | ICD-10-CM

## 2019-01-07 DIAGNOSIS — Z905 Acquired absence of kidney: Secondary | ICD-10-CM | POA: Diagnosis not present

## 2019-01-07 DIAGNOSIS — C651 Malignant neoplasm of right renal pelvis: Secondary | ICD-10-CM

## 2019-01-07 DIAGNOSIS — Z79899 Other long term (current) drug therapy: Secondary | ICD-10-CM | POA: Insufficient documentation

## 2019-01-07 DIAGNOSIS — Z95828 Presence of other vascular implants and grafts: Secondary | ICD-10-CM

## 2019-01-07 LAB — CMP (CANCER CENTER ONLY)
ALT: 51 U/L — ABNORMAL HIGH (ref 0–44)
AST: 34 U/L (ref 15–41)
Albumin: 4 g/dL (ref 3.5–5.0)
Alkaline Phosphatase: 58 U/L (ref 38–126)
Anion gap: 8 (ref 5–15)
BUN: 26 mg/dL — ABNORMAL HIGH (ref 8–23)
CO2: 24 mmol/L (ref 22–32)
Calcium: 8.9 mg/dL (ref 8.9–10.3)
Chloride: 103 mmol/L (ref 98–111)
Creatinine: 1.86 mg/dL — ABNORMAL HIGH (ref 0.61–1.24)
GFR, Est AFR Am: 44 mL/min — ABNORMAL LOW (ref >60)
GFR, Estimated: 38 mL/min — ABNORMAL LOW (ref >60)
Glucose, Bld: 244 mg/dL — ABNORMAL HIGH (ref 70–99)
Potassium: 4.4 mmol/L (ref 3.5–5.1)
Sodium: 135 mmol/L (ref 135–145)
Total Bilirubin: 0.5 mg/dL (ref 0.3–1.2)
Total Protein: 6.4 g/dL — ABNORMAL LOW (ref 6.5–8.1)

## 2019-01-07 LAB — CBC WITH DIFFERENTIAL (CANCER CENTER ONLY)
Abs Immature Granulocytes: 0.02 10*3/uL (ref 0.00–0.07)
Basophils Absolute: 0 10*3/uL (ref 0.0–0.1)
Basophils Relative: 0 %
Eosinophils Absolute: 0.1 10*3/uL (ref 0.0–0.5)
Eosinophils Relative: 3 %
HCT: 36.8 % — ABNORMAL LOW (ref 39.0–52.0)
Hemoglobin: 12.4 g/dL — ABNORMAL LOW (ref 13.0–17.0)
Immature Granulocytes: 1 %
Lymphocytes Relative: 29 %
Lymphs Abs: 1 10*3/uL (ref 0.7–4.0)
MCH: 31.7 pg (ref 26.0–34.0)
MCHC: 33.7 g/dL (ref 30.0–36.0)
MCV: 94.1 fL (ref 80.0–100.0)
Monocytes Absolute: 0.4 10*3/uL (ref 0.1–1.0)
Monocytes Relative: 11 %
Neutro Abs: 2.1 10*3/uL (ref 1.7–7.7)
Neutrophils Relative %: 56 %
Platelet Count: 108 10*3/uL — ABNORMAL LOW (ref 150–400)
RBC: 3.91 MIL/uL — ABNORMAL LOW (ref 4.22–5.81)
RDW: 13.4 % (ref 11.5–15.5)
WBC Count: 3.6 10*3/uL — ABNORMAL LOW (ref 4.0–10.5)
nRBC: 0 % (ref 0.0–0.2)

## 2019-01-07 MED ORDER — SODIUM CHLORIDE 0.9% FLUSH
10.0000 mL | INTRAVENOUS | Status: DC | PRN
  Administered 2019-01-07: 10 mL via INTRAVENOUS
  Filled 2019-01-07: qty 10

## 2019-01-07 MED ORDER — HEPARIN SOD (PORK) LOCK FLUSH 100 UNIT/ML IV SOLN
500.0000 [IU] | Freq: Once | INTRAVENOUS | Status: AC
  Administered 2019-01-07: 500 [IU] via INTRAVENOUS
  Filled 2019-01-07: qty 5

## 2019-01-07 MED ORDER — ALPRAZOLAM 1 MG PO TABS: 1.0000 mg | ORAL_TABLET | Freq: Three times a day (TID) | ORAL | 0 refills | Status: DC | PRN

## 2019-01-07 MED FILL — AMOXICILLIN 500 MG CAPSULE: 500 | 2 days supply | Qty: 8 | Fill #1

## 2019-01-07 MED FILL — ALPRAZolam 1 MG TABS: 1 | 30 days supply | Qty: 90 | Fill #0

## 2019-01-07 NOTE — Telephone Encounter (Signed)
lmom to inform patient of appts per 8/28 LOS

## 2019-01-07 NOTE — Progress Notes (Signed)
Hematology and Oncology Follow Up Visit  Isaac [name] [id number] 27-Nov-1955 63 y.o. 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 is here today for follow-up.  He is doing okay.  He really has had no specific complaints.  He says that he does get a little bit dizzy when he bends over and then stands back up.  This might be from the Imdur that he is on.  He is also noted some swelling in his feet.  This might be from the amlodipine that he takes.  We did do his PET scan.  This was done on 12/31/2018.  There is no evidence of recurrent disease.  He has stable low-level metabolism in the pelvic sidewall and inguinal nodes.  He is eating well.  He has diabetes.  He is trying to watch his diabetes closely.  He does have some neuropathy from the diabetes.  He has had no bleeding.  He has had no fever.  There has been no cough.  He has had no obvious change in bowel or bladder habits.  He does go back to see the urologist because of superficial bladder tumors.  I think he needs a cystoscopy for this.  Overall, his performance status is ECOG 1.    Medications:  Allergies as of 01/07/2019      Reactions   Hydrocodone Itching, Nausea And Vomiting   Dilaudid [hydromorphone] Nausea And Vomiting      Medication List       Accurate as of January 07, 2019  1:09 PM. If you have any questions, ask your nurse or doctor.        ALPRAZolam 1 MG tablet Commonly known as: XANAX Take 1 tablet (1 mg total) by mouth 3 (three) times daily as needed for anxiety.   amLODipine 10 MG tablet Commonly known as: NORVASC TAKE 1 TABLET BY MOUTH EVERY DAY HOLD FOR SYSTOLIC BLOOD PRESSURE LESS THAN 110   aspirin EC 81 MG tablet Take 81 mg by mouth at bedtime.    diazepam 10 MG tablet Commonly known as: VALIUM Take 10 mg by mouth See admin instructions. Take one tablet (10 mg) by mouth one hour prior to bladder procedure or BCG   glimepiride 2 MG tablet Commonly known as: AMARYL TAKE 1 BY MOUTH DAILY BEFORE SUPPER What changed:   how much to take  how to take this  when to take this  additional instructions   isosorbide mononitrate 120 MG 24 hr tablet Commonly known as: IMDUR TAKE 1 TABLET BY MOUTH DAILY. GENERIC EQUIVALENT FOR IMDUR   metoprolol succinate 25 MG 24 hr tablet Commonly known as: TOPROL-XL Take 1 tablet (25 mg total) by mouth at bedtime.   multivitamin with minerals Tabs tablet Take 1 tablet by mouth daily. Centrum Silver   nitroGLYCERIN 0.4 MG SL tablet Commonly known as: NITROSTAT Place 1 tablet (0.4 mg total) under the tongue every 5 (five) minutes as needed for chest pain.   pantoprazole 40 MG tablet Commonly known as: PROTONIX TAKE 1 TABLET BY MOUTH EVERY MORNING   PRESCRIPTION MEDICATION Inhale into the lungs at bedtime. CPAP   rOPINIRole 2 MG tablet Commonly known as: REQUIP Take 1 tablet (2 mg total) by mouth at bedtime.   rosuvastatin 10 MG tablet Commonly known as: CRESTOR TAKE 1 TAB (  10 MG) BY MOUTH EVERY OTHER DAY   Toujeo SoloStar 300 UNIT/ML Sopn Generic drug: Insulin Glargine (1 Unit Dial) Inject 50 Units into the skin at bedtime.   Victoza 18 MG/3ML Sopn Generic drug: liraglutide Inject into the skin daily.       Allergies:  Allergies  Allergen Reactions  . Hydrocodone Itching and Nausea And Vomiting  . Dilaudid [Hydromorphone] Nausea And Vomiting    Past Medical History, Surgical history, Social history, and Family History were reviewed and updated.  Review of Systems: Review of Systems  Constitutional: Negative.   HENT: Positive for tinnitus.   Eyes: Negative.   Respiratory: Negative.   Cardiovascular: Negative.   Gastrointestinal: Positive for nausea.  Genitourinary:  Negative.   Musculoskeletal: Negative.   Skin: Negative.   Neurological: Negative.   Endo/Heme/Allergies: Negative.   Psychiatric/Behavioral: Negative.      Physical Exam:  weight is 208 lb (94.3 kg). His temporal temperature is 97.9 F (36.6 C). His blood pressure is 121/75 and his pulse is 54 (abnormal). His respiration is 16 and oxygen saturation is 96%.   Wt Readings from Last 3 Encounters:  01/07/19 208 lb (94.3 kg)  11/19/18 210 lb (95.3 kg)  10/06/18 210 lb (95.3 kg)    Physical Exam Vitals signs reviewed.  HENT:     Head: Normocephalic and atraumatic.  Eyes:     Pupils: Pupils are equal, round, and reactive to light.  Neck:     Musculoskeletal: Normal range of motion.  Cardiovascular:     Rate and Rhythm: Normal rate and regular rhythm.     Heart sounds: Normal heart sounds.  Pulmonary:     Effort: Pulmonary effort is normal.     Breath sounds: Normal breath sounds.  Abdominal:     General: Bowel sounds are normal.     Palpations: Abdomen is soft.  Musculoskeletal: Normal range of motion.        General: No tenderness or deformity.  Lymphadenopathy:     Cervical: No cervical adenopathy.  Skin:    General: Skin is warm and dry.     Findings: No erythema or rash.  Neurological:     Mental Status: He is alert and oriented to person, place, and time.  Psychiatric:        Behavior: Behavior normal.        Thought Content: Thought content normal.        Judgment: Judgment normal.     Lab Results  Component Value Date   WBC 3.6 (L) 01/07/2019   HGB 12.4 (L) 01/07/2019   HCT 36.8 (L) 01/07/2019   MCV 94.1 01/07/2019   PLT 108 (L) 01/07/2019   Lab Results  Component Value Date   FERRITIN 52 07/06/2018   IRON 98 07/06/2018   TIBC 371 07/06/2018   UIBC 273 07/06/2018   IRONPCTSAT 27 07/06/2018   Lab Results  Component Value Date   RETICCTPCT 2.2 (H) 10/15/2017   RBC 3.91 (L) 01/07/2019   No results found for: KPAFRELGTCHN, LAMBDASER, [facility]  Lab Results  Component Value Date   IGA 232 01/25/2015   No results found for: Odetta Pink, SPEI   Chemistry      Component Value Date/Time   NA 135 01/07/2019 1215   NA 134 05/04/2017 0925   K 4.4 01/07/2019 1215   K 4.5 05/04/2017 0925   CL 103 01/07/2019 1215   CL 97 (L) 05/04/2017 0925   CO2 24 01/07/2019  1215   CO2 23 05/04/2017 0925   BUN 26 (H) 01/07/2019 1215   BUN 28 (H) 05/04/2017 0925   CREATININE 1.86 (H) 01/07/2019 1215   CREATININE 1.90 (H) 03/09/2018 1034   GLU 86 03/31/2016      Component Value Date/Time   CALCIUM 8.9 01/07/2019 1215   CALCIUM 9.3 05/04/2017 0925   ALKPHOS 58 01/07/2019 1215   ALKPHOS 58 05/04/2017 0925   AST 34 01/07/2019 1215   ALT 51 (H) 01/07/2019 1215   ALT 90 (H) 05/04/2017 0925   BILITOT 0.5 01/07/2019 1215      Impression and Plan: [name] is a very pleasant 63 yo caucasian gentleman with infiltrating high grade papillary urothelial carcinoma with right nephrectomy in October 2018.   At this point, we are in a surveillance mode.  I will go ahead and set him up with another PET scan to be done in 3 months.   I will plan to have him come back in 3 months.  We will do the PET scan before I see him back.  He will have his Port-A-Cath flushed in 6 weeks.    [name], MD 8/28/20201:09 PM

## 2019-01-13 DIAGNOSIS — Z0120 Encounter for dental examination and cleaning without abnormal findings: Secondary | ICD-10-CM | POA: Diagnosis not present

## 2019-01-25 ENCOUNTER — Telehealth: Payer: Self-pay | Admitting: Internal Medicine

## 2019-01-25 NOTE — Telephone Encounter (Signed)
Faxed last year of Medical Records to [facility]

## 2019-02-01 DIAGNOSIS — R300 Dysuria: Secondary | ICD-10-CM | POA: Diagnosis not present

## 2019-02-01 DIAGNOSIS — C678 Malignant neoplasm of overlapping sites of bladder: Secondary | ICD-10-CM | POA: Diagnosis not present

## 2019-02-01 MED FILL — DIAZEPAM 10 MG TABS: 10 | 3 days supply | Qty: 6 | Fill #0

## 2019-02-01 MED FILL — TAMSULOSIN HCL 0.4 MG CAP: 0.4 | 90 days supply | Qty: 90 | Fill #0

## 2019-02-03 DIAGNOSIS — B078 Other viral warts: Secondary | ICD-10-CM | POA: Diagnosis not present

## 2019-02-03 DIAGNOSIS — D1801 Hemangioma of skin and subcutaneous tissue: Secondary | ICD-10-CM | POA: Diagnosis not present

## 2019-02-03 DIAGNOSIS — L570 Actinic keratosis: Secondary | ICD-10-CM | POA: Diagnosis not present

## 2019-02-03 DIAGNOSIS — Z85828 Personal history of other malignant neoplasm of skin: Secondary | ICD-10-CM | POA: Diagnosis not present

## 2019-02-15 DIAGNOSIS — Z5111 Encounter for antineoplastic chemotherapy: Secondary | ICD-10-CM | POA: Diagnosis not present

## 2019-02-15 DIAGNOSIS — C678 Malignant neoplasm of overlapping sites of bladder: Secondary | ICD-10-CM | POA: Diagnosis not present

## 2019-02-15 MED FILL — TAMSULOSIN HCL 0.4 MG CAP: 0.4 | 90 days supply | Qty: 90 | Fill #0

## 2019-02-15 MED FILL — DIAZEPAM 10 MG TABS: 10 | 3 days supply | Qty: 6 | Fill #0

## 2019-02-22 DIAGNOSIS — C678 Malignant neoplasm of overlapping sites of bladder: Secondary | ICD-10-CM | POA: Diagnosis not present

## 2019-02-22 DIAGNOSIS — Z5111 Encounter for antineoplastic chemotherapy: Secondary | ICD-10-CM | POA: Diagnosis not present

## 2019-03-01 DIAGNOSIS — R8271 Bacteriuria: Secondary | ICD-10-CM | POA: Diagnosis not present

## 2019-03-01 DIAGNOSIS — C678 Malignant neoplasm of overlapping sites of bladder: Secondary | ICD-10-CM | POA: Diagnosis not present

## 2019-03-07 ENCOUNTER — Other Ambulatory Visit: Payer: Self-pay

## 2019-03-07 ENCOUNTER — Other Ambulatory Visit: Payer: Medicare Other | Admitting: Internal Medicine

## 2019-03-07 ENCOUNTER — Inpatient Hospital Stay: Payer: Medicare Other | Attending: Hematology & Oncology

## 2019-03-07 DIAGNOSIS — Z0000 Encounter for general adult medical examination without abnormal findings: Secondary | ICD-10-CM | POA: Diagnosis not present

## 2019-03-07 DIAGNOSIS — Z85828 Personal history of other malignant neoplasm of skin: Secondary | ICD-10-CM

## 2019-03-07 DIAGNOSIS — Z8551 Personal history of malignant neoplasm of bladder: Secondary | ICD-10-CM | POA: Insufficient documentation

## 2019-03-07 DIAGNOSIS — C679 Malignant neoplasm of bladder, unspecified: Secondary | ICD-10-CM

## 2019-03-07 DIAGNOSIS — E1142 Type 2 diabetes mellitus with diabetic polyneuropathy: Secondary | ICD-10-CM

## 2019-03-07 DIAGNOSIS — F32A Depression, unspecified: Secondary | ICD-10-CM

## 2019-03-07 DIAGNOSIS — I10 Essential (primary) hypertension: Secondary | ICD-10-CM | POA: Diagnosis not present

## 2019-03-07 DIAGNOSIS — G4730 Sleep apnea, unspecified: Secondary | ICD-10-CM

## 2019-03-07 DIAGNOSIS — F419 Anxiety disorder, unspecified: Secondary | ICD-10-CM

## 2019-03-07 DIAGNOSIS — E7849 Other hyperlipidemia: Secondary | ICD-10-CM | POA: Diagnosis not present

## 2019-03-07 DIAGNOSIS — E119 Type 2 diabetes mellitus without complications: Secondary | ICD-10-CM

## 2019-03-07 DIAGNOSIS — Z125 Encounter for screening for malignant neoplasm of prostate: Secondary | ICD-10-CM

## 2019-03-07 DIAGNOSIS — F329 Major depressive disorder, single episode, unspecified: Secondary | ICD-10-CM

## 2019-03-07 DIAGNOSIS — G2581 Restless legs syndrome: Secondary | ICD-10-CM

## 2019-03-07 NOTE — Patient Instructions (Signed)

## 2019-03-08 DIAGNOSIS — H35363 Drusen (degenerative) of macula, bilateral: Secondary | ICD-10-CM | POA: Diagnosis not present

## 2019-03-08 DIAGNOSIS — E119 Type 2 diabetes mellitus without complications: Secondary | ICD-10-CM | POA: Diagnosis not present

## 2019-03-08 DIAGNOSIS — H25013 Cortical age-related cataract, bilateral: Secondary | ICD-10-CM | POA: Diagnosis not present

## 2019-03-08 DIAGNOSIS — H2513 Age-related nuclear cataract, bilateral: Secondary | ICD-10-CM | POA: Diagnosis not present

## 2019-03-08 LAB — CBC WITH DIFFERENTIAL/PLATELET
Absolute Monocytes: 428 cells/uL (ref 200–950)
Basophils Absolute: 21 cells/uL (ref 0–200)
Basophils Relative: 0.5 %
Eosinophils Absolute: 88 cells/uL (ref 15–500)
Eosinophils Relative: 2.1 %
HCT: 41.9 % (ref 38.5–50.0)
Hemoglobin: 13.9 g/dL (ref 13.2–17.1)
Lymphs Abs: 1411 cells/uL (ref 850–3900)
MCH: 31 pg (ref 27.0–33.0)
MCHC: 33.2 g/dL (ref 32.0–36.0)
MCV: 93.5 fL (ref 80.0–100.0)
MPV: 9.3 fL (ref 7.5–12.5)
Monocytes Relative: 10.2 %
Neutro Abs: 2251 cells/uL (ref 1500–7800)
Neutrophils Relative %: 53.6 %
Platelets: 128 10*3/uL — ABNORMAL LOW (ref 140–400)
RBC: 4.48 10*6/uL (ref 4.20–5.80)
RDW: 13.3 % (ref 11.0–15.0)
Total Lymphocyte: 33.6 %
WBC: 4.2 10*3/uL (ref 3.8–10.8)

## 2019-03-08 LAB — HEMOGLOBIN A1C
Hgb A1c MFr Bld: 8.9 % of total Hgb — ABNORMAL HIGH (ref <5.7)
Mean Plasma Glucose: 209 (calc)
eAG (mmol/L): 11.6 (calc)

## 2019-03-08 LAB — COMPLETE METABOLIC PANEL WITH GFR
AG Ratio: 1.8 (calc) (ref 1.0–2.5)
ALT: 40 U/L (ref 9–46)
AST: 34 U/L (ref 10–35)
Albumin: 4.1 g/dL (ref 3.6–5.1)
Alkaline phosphatase (APISO): 55 U/L (ref 35–144)
BUN/Creatinine Ratio: 11 (calc) (ref 6–22)
BUN: 22 mg/dL (ref 7–25)
CO2: 21 mmol/L (ref 20–32)
Calcium: 9.1 mg/dL (ref 8.6–10.3)
Chloride: 104 mmol/L (ref 98–110)
Creat: 1.95 mg/dL — ABNORMAL HIGH (ref 0.70–1.25)
GFR, Est African American: 41 mL/min/{1.73_m2} — ABNORMAL LOW (ref >=60)
GFR, Est Non African American: 36 mL/min/{1.73_m2} — ABNORMAL LOW (ref >=60)
Globulin: 2.3 g/dL (calc) (ref 1.9–3.7)
Glucose, Bld: 134 mg/dL — ABNORMAL HIGH (ref 65–99)
Potassium: 4.6 mmol/L (ref 3.5–5.3)
Sodium: 137 mmol/L (ref 135–146)
Total Bilirubin: 0.6 mg/dL (ref 0.2–1.2)
Total Protein: 6.4 g/dL (ref 6.1–8.1)

## 2019-03-08 LAB — LIPID PANEL
Cholesterol: 123 mg/dL (ref <200)
HDL: 30 mg/dL — ABNORMAL LOW (ref >=40)
LDL Cholesterol (Calc): 66 mg/dL (calc)
Non-HDL Cholesterol (Calc): 93 mg/dL (calc) (ref <130)
Total CHOL/HDL Ratio: 4.1 (calc) (ref <5.0)
Triglycerides: 208 mg/dL — ABNORMAL HIGH (ref <150)

## 2019-03-08 LAB — MICROALBUMIN / CREATININE URINE RATIO
Creatinine, Urine: 91 mg/dL (ref 20–320)
Microalb Creat Ratio: 53 mcg/mg creat — ABNORMAL HIGH (ref <30)
Microalb, Ur: 4.8 mg/dL

## 2019-03-08 LAB — HM DIABETES EYE EXAM

## 2019-03-08 LAB — PSA: PSA: 1.1 ng/mL (ref <=4.0)

## 2019-03-09 DIAGNOSIS — C678 Malignant neoplasm of overlapping sites of bladder: Secondary | ICD-10-CM | POA: Diagnosis not present

## 2019-03-09 DIAGNOSIS — Z5111 Encounter for antineoplastic chemotherapy: Secondary | ICD-10-CM | POA: Diagnosis not present

## 2019-03-10 ENCOUNTER — Other Ambulatory Visit: Payer: Medicare Other | Admitting: Internal Medicine

## 2019-03-11 ENCOUNTER — Other Ambulatory Visit: Payer: Self-pay | Admitting: Internal Medicine

## 2019-03-11 DIAGNOSIS — G2581 Restless legs syndrome: Secondary | ICD-10-CM

## 2019-03-14 ENCOUNTER — Ambulatory Visit (INDEPENDENT_AMBULATORY_CARE_PROVIDER_SITE_OTHER): Payer: Medicare Other | Admitting: Internal Medicine

## 2019-03-14 ENCOUNTER — Other Ambulatory Visit: Payer: Self-pay | Admitting: Internal Medicine

## 2019-03-14 ENCOUNTER — Encounter: Payer: Self-pay | Admitting: Internal Medicine

## 2019-03-14 ENCOUNTER — Other Ambulatory Visit: Payer: Self-pay

## 2019-03-14 VITALS — BP 120/60 | HR 68 | Temp 98.0°F | Ht 69.0 in | Wt 212.0 lb

## 2019-03-14 DIAGNOSIS — G4733 Obstructive sleep apnea (adult) (pediatric): Secondary | ICD-10-CM

## 2019-03-14 DIAGNOSIS — E7849 Other hyperlipidemia: Secondary | ICD-10-CM

## 2019-03-14 DIAGNOSIS — F32A Depression, unspecified: Secondary | ICD-10-CM

## 2019-03-14 DIAGNOSIS — Z96651 Presence of right artificial knee joint: Secondary | ICD-10-CM | POA: Diagnosis not present

## 2019-03-14 DIAGNOSIS — H903 Sensorineural hearing loss, bilateral: Secondary | ICD-10-CM

## 2019-03-14 DIAGNOSIS — E1142 Type 2 diabetes mellitus with diabetic polyneuropathy: Secondary | ICD-10-CM

## 2019-03-14 DIAGNOSIS — Z85828 Personal history of other malignant neoplasm of skin: Secondary | ICD-10-CM

## 2019-03-14 DIAGNOSIS — Z0000 Encounter for general adult medical examination without abnormal findings: Secondary | ICD-10-CM

## 2019-03-14 DIAGNOSIS — C679 Malignant neoplasm of bladder, unspecified: Secondary | ICD-10-CM

## 2019-03-14 DIAGNOSIS — G2581 Restless legs syndrome: Secondary | ICD-10-CM | POA: Diagnosis not present

## 2019-03-14 DIAGNOSIS — F329 Major depressive disorder, single episode, unspecified: Secondary | ICD-10-CM

## 2019-03-14 DIAGNOSIS — I252 Old myocardial infarction: Secondary | ICD-10-CM

## 2019-03-14 DIAGNOSIS — F419 Anxiety disorder, unspecified: Secondary | ICD-10-CM

## 2019-03-14 DIAGNOSIS — E1165 Type 2 diabetes mellitus with hyperglycemia: Secondary | ICD-10-CM | POA: Diagnosis not present

## 2019-03-14 DIAGNOSIS — B0229 Other postherpetic nervous system involvement: Secondary | ICD-10-CM | POA: Diagnosis not present

## 2019-03-14 DIAGNOSIS — Z86010 Personal history of colonic polyps: Secondary | ICD-10-CM

## 2019-03-14 DIAGNOSIS — Z96652 Presence of left artificial knee joint: Secondary | ICD-10-CM

## 2019-03-14 DIAGNOSIS — I10 Essential (primary) hypertension: Secondary | ICD-10-CM

## 2019-03-14 LAB — POCT URINALYSIS DIPSTICK
Appearance: NEGATIVE
Bilirubin, UA: NEGATIVE
Blood, UA: NEGATIVE
Glucose, UA: POSITIVE — AB
Ketones, UA: NEGATIVE
Leukocytes, UA: NEGATIVE
Nitrite, UA: NEGATIVE
Odor: NEGATIVE
Protein, UA: NEGATIVE
Spec Grav, UA: 1.01 (ref 1.010–1.025)
Urobilinogen, UA: 0.2 E.U./dL
pH, UA: 6.5 (ref 5.0–8.0)

## 2019-03-14 MED ORDER — KETOCONAZOLE 2 % EX CREA: 1.0000 "application " | TOPICAL_CREAM | Freq: Every day | CUTANEOUS | 0 refills | Status: DC

## 2019-03-14 MED ORDER — KETOCONAZOLE 2 % EX CREA: 1.0000 "application " | TOPICAL_CREAM | Freq: Every day | CUTANEOUS | 99 refills | Status: DC

## 2019-03-14 MED ORDER — PANTOPRAZOLE SODIUM 40 MG PO TBEC: 40.0000 mg | DELAYED_RELEASE_TABLET | Freq: Every morning | ORAL | 2 refills | Status: DC

## 2019-03-14 MED ORDER — ROSUVASTATIN CALCIUM 10 MG PO TABS: ORAL_TABLET | ORAL | 2 refills | Status: DC

## 2019-03-14 MED FILL — KETOCONAZOLE 2% CREAM: 2 | 15 days supply | Qty: 30 | Fill #0

## 2019-03-14 NOTE — Patient Instructions (Addendum)
Defer Shingrix vaccine for now.  Continue current medications and follow-up in 1 year or as needed.  Continue follow-up with your specialists.  It was a pleasure to see you today.

## 2019-03-14 NOTE — Progress Notes (Signed)
Subjective:    Patient ID: Isaac [name], male    DOB: [birth year], 63 y.o.   MRN: [id number]  HPI 63 year old Male for Medicare wellness, health maintenance exam and evaluation of medical issues.  History of bladder cancer and just finished BCG treatments x 3 for bladder cancer per urologist.  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 [name] in 2008.  History of restless leg syndrome.  History of sleep apnea.  In January 2017 he had left knee total arthroplasty in November 2017 right knee total arthroplasty.  Dr. [name] is his endocrinologist.  He is in [place].  Social history: He is not married.  No children.  He formerly smoked for many years but quit.  He formerly worked as a Dealer.  He receives disability benefits.  Occasional alcohol consumption.  Dr. Herbert Deaner does annual eye exam.  Due to doughnut hole, Toujeo not affordable nor is Victoza. Holding off on Victoza per Endocrinologist, Dr. Steffanie Dunn. Given Good Rx Card today to see if better price with prescriptions.  Hemoglobin AIC is 8.9%  Had colonoscopy Dec 2019.  Hx low platelet  count  Has postherpetic neuralgia right arm treated with Neurontin.  He is on disability due to his multiple medical issues  History of his anxiety and depression  History of GE reflux  History of coronary disease status post MI  Dr. Herbert Deaner is his ophthalmologist  History of fatty liver on ultrasound done by Dr. [name] in 2016.  History of mild elevation of SGOT and SGPT.  He had an umbilical hernia repair in 2004, right elbow arthroscopic with open reconstruction 2005, right rotator cuff repair 2008, right knee arthroscopic surgery with debridement 2005, multiple cystoscopies and resections of bladder tumors.  Right distal ureterectomy with reimplantation in 2010.  Rhinoplasty with modified cartilage graft 2009.  Appendectomy 2006  History of left lower lobe pneumonia 1995.  Has had Pneumovax and  Prevnar vaccines.  No known drug allergies.  He had colonoscopy by Dr. [name] in 2007 and repeat study done in 2013.  History of adenomatous colon polyps.  Only hyperplastic polyps were found in 2013 with 5-year follow-up recommended.  He has not had that procedure yet.  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  Did not tolerate Lyrica.  Has been tried on Cymbalta.  History of hearing loss.  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  Family history: Father died of an accident at age 6.  Father with history of CABG.  Mother died at age 25 apparently of accidental carbon monoxide poisoning when she locked herself out of the house and stayed in the running car to keep warm.  Maternal grandmother with history of diabetes.  1 sister.  Maternal grandfather with history of lung cancer.  No known drug allergies.  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  Gets PET scan q 3 months per Oncologist. Followed by Dr. [name].  Completed cis-platinum gemcitabine March 2019 for infiltrating high-grade papillary urothelial carcinoma of the right renal hilum.  Underwent right nephro ureterectomy on October 2018.  Review of Systems Had issues with dependent edema but stopped amlodipine and edema resolved. Complains of perirectal irritation and will prescribe Nizoral.     Objective:   Physical Exam Blood pressure 120/60 pulse 68 temperature 98 degrees weight 212 pounds BMI 31.31 skin is fair warm and dry.  Nodes none.  TMs and pharynx are clear.  Neck is supple without JVD thyromegaly or carotid bruits.  Chest clear to auscultation.  Cardiac exam regular rate and rhythm normal S1 and S2 without murmurs.  Prostate exam deferred to urologist.  No lower extremity edema.  Affect is normal.  No focal deficits on brief neurological exam.       Assessment & Plan:  History of bladder cancer  followed by Dr. [name] and by urologist PSA is normal   Diabetes mellitus followed by endocrinologist-hemoglobin A1c 8.9% and last year was 11.4%  History of depression treated with Cymbalta  History of postherpetic neuralgia of arm treated with gabapentin  Peripheral neuropathy-bilateral treated with Cymbalta  Status post bilateral knee arthroscopies  History of skin cancer years ago  History of non-STEMI 2005  Hyperlipidemia treated with statin  History of restless leg syndrome  History of hearing loss  History of hypertension-stable  Plan: He is followed by multiple specialists and can return in 1 year or as needed.  Subjective:   Patient presents for Medicare Annual/Subsequent preventive examination.  Review Past Medical/Family/Social: See above   Risk Factors  Current exercise habits: Mostly sedentary-not much energy Dietary issues discussed: Low-fat low carbohydrate  Cardiac risk factors: Diabetes mellitus, history of non-STEMI in 2005, family history of MI remote history of smoking, hyperlipidemia  Depression Screen  (Note: if answer to either of the following is "Yes", a more complete depression screening is indicated)   Over the past two weeks, have you felt down, depressed or hopeless? No  Over the past two weeks, have you felt little interest or pleasure in doing things? No Have you lost interest or pleasure in daily life? No Do you often feel hopeless? No Do you cry easily over simple problems? No   Activities of Daily Living  In your present state of health, do you have any difficulty performing the following activities?:   Driving? No  Managing money? No  Feeding yourself? No  Getting from bed to chair? No  Climbing a flight of stairs? No  Preparing food and eating?: No  Bathing or showering? No  Getting dressed: No  Getting to the toilet? No  Using the toilet:No  Moving around from place to place: No  In the past year have you fallen or  had a near fall?:No  Are you sexually active?  Yes Do you have more than one partner? No   Hearing Difficulties: No  Do you often ask people to speak up or repeat themselves?  Yes Do you experience ringing or noises in your ears?  Yes Do you have difficulty understanding soft or whispered voices?  Yes Do you feel that you have a problem with memory?  Sometimes Do you often misplace items?  Sometimes   Home Safety:  Do you have a smoke alarm at your residence?  None Do you have grab bars in the bathroom?  None Do you have throw rugs in your house?  None   Cognitive Testing  Alert? Yes Normal Appearance?Yes  Oriented to person? Yes Place? Yes  Time? Yes  Recall of three objects? Yes  Can perform simple calculations? Yes  Displays appropriate judgment?Yes  Can read the correct time from a watch face?Yes   List the Names of Other Physician/Practitioners you currently use:  See referral list for the physicians patient is currently seeing.     Review of Systems: See above   Objective:     General appearance: Appears stated age and  mildly obese  Head: Normocephalic, without obvious abnormality, atraumatic  Eyes: conj clear, EOMi PEERLA  Ears: normal TM's and external ear canals both ears  Nose: Nares normal. Septum midline. Mucosa normal. No drainage or sinus tenderness.  Throat: lips, mucosa, and tongue normal; teeth and gums normal  Neck: no adenopathy, no carotid bruit, no JVD, supple, symmetrical, trachea midline and thyroid not enlarged, symmetric, no tenderness/mass/nodules  No CVA tenderness.  Lungs: clear to auscultation bilaterally  Breasts: normal appearance, no masses or tenderness Heart: regular rate and rhythm, S1, S2 normal, no murmur, click, rub or gallop  Abdomen: soft, non-tender; bowel sounds normal; no masses, no organomegaly  Musculoskeletal: ROM normal in all joints, no crepitus, no deformity, Normal muscle strengthen. Back  is symmetric, no curvature.  Skin: Skin color, texture, turgor normal. No rashes or lesions  Lymph nodes: Cervical, supraclavicular, and axillary nodes normal.  Neurologic: CN 2 -12 Normal, Normal symmetric reflexes. Normal coordination and gait  Psych: Alert & Oriented x 3, Mood appear stable.    Assessment:    Annual wellness medicare exam   Plan:    During the course of the visit the patient was educated and counseled about appropriate screening and preventive services including:   Annual flu vaccine     Patient Instructions (the written plan) was given to the patient.  Medicare Attestation  I have personally reviewed:  The patient's medical and social history  Their use of alcohol, tobacco or illicit drugs  Their current medications and supplements  The patient's functional ability including ADLs,fall risks, home safety risks, cognitive, and hearing and visual impairment  Diet and physical activities  Evidence for depression or mood disorders  The patient's weight, height, BMI, and visual acuity have been recorded in the chart. I have made referrals, counseling, and provided education to the patient based on review of the above and I have provided the patient with a written personalized care plan for preventive services.

## 2019-03-29 DIAGNOSIS — E119 Type 2 diabetes mellitus without complications: Secondary | ICD-10-CM | POA: Diagnosis not present

## 2019-03-29 DIAGNOSIS — Z794 Long term (current) use of insulin: Secondary | ICD-10-CM | POA: Diagnosis not present

## 2019-03-31 DIAGNOSIS — Z794 Long term (current) use of insulin: Secondary | ICD-10-CM | POA: Diagnosis not present

## 2019-03-31 DIAGNOSIS — E119 Type 2 diabetes mellitus without complications: Secondary | ICD-10-CM | POA: Diagnosis not present

## 2019-04-08 DIAGNOSIS — Z794 Long term (current) use of insulin: Secondary | ICD-10-CM | POA: Diagnosis not present

## 2019-04-08 DIAGNOSIS — E119 Type 2 diabetes mellitus without complications: Secondary | ICD-10-CM | POA: Diagnosis not present

## 2019-04-11 DIAGNOSIS — Z794 Long term (current) use of insulin: Secondary | ICD-10-CM | POA: Diagnosis not present

## 2019-04-11 DIAGNOSIS — E119 Type 2 diabetes mellitus without complications: Secondary | ICD-10-CM | POA: Diagnosis not present

## 2019-04-12 DIAGNOSIS — R300 Dysuria: Secondary | ICD-10-CM | POA: Diagnosis not present

## 2019-04-20 ENCOUNTER — Ambulatory Visit (HOSPITAL_COMMUNITY)
Admission: RE | Admit: 2019-04-20 | Discharge: 2019-04-20 | Disposition: A | Discharge status: Home / Self Care | Payer: Medicare Other | Source: Ambulatory Visit | Attending: Hematology & Oncology | Admitting: Hematology & Oncology

## 2019-04-20 ENCOUNTER — Other Ambulatory Visit: Payer: Self-pay

## 2019-04-20 DIAGNOSIS — Z79899 Other long term (current) drug therapy: Secondary | ICD-10-CM | POA: Diagnosis not present

## 2019-04-20 DIAGNOSIS — C679 Malignant neoplasm of bladder, unspecified: Secondary | ICD-10-CM | POA: Diagnosis not present

## 2019-04-20 DIAGNOSIS — C651 Malignant neoplasm of right renal pelvis: Secondary | ICD-10-CM | POA: Diagnosis not present

## 2019-04-20 LAB — GLUCOSE, CAPILLARY: Glucose-Capillary: 125 mg/dL — ABNORMAL HIGH (ref 70–99)

## 2019-04-20 MED ORDER — FLUDEOXYGLUCOSE F - 18 (FDG) INJECTION
10.2000 | Freq: Once | INTRAVENOUS | Status: AC
  Administered 2019-04-20: 09:00:00 10.2 via INTRAVENOUS

## 2019-04-21 ENCOUNTER — Encounter: Payer: Self-pay | Admitting: *Deleted

## 2019-04-21 DIAGNOSIS — G4733 Obstructive sleep apnea (adult) (pediatric): Secondary | ICD-10-CM | POA: Diagnosis not present

## 2019-04-27 ENCOUNTER — Inpatient Hospital Stay: Payer: Medicare Other

## 2019-04-27 ENCOUNTER — Inpatient Hospital Stay: Payer: Medicare Other | Attending: Hematology & Oncology | Admitting: Hematology & Oncology

## 2019-04-27 ENCOUNTER — Encounter: Payer: Self-pay | Admitting: Hematology & Oncology

## 2019-04-27 ENCOUNTER — Other Ambulatory Visit: Payer: Self-pay

## 2019-04-27 VITALS — BP 149/79 | HR 74 | Temp 97.5°F | Resp 20 | Wt 220.0 lb

## 2019-04-27 DIAGNOSIS — Z8551 Personal history of malignant neoplasm of bladder: Secondary | ICD-10-CM | POA: Insufficient documentation

## 2019-04-27 DIAGNOSIS — Z7982 Long term (current) use of aspirin: Secondary | ICD-10-CM | POA: Insufficient documentation

## 2019-04-27 DIAGNOSIS — Z9221 Personal history of antineoplastic chemotherapy: Secondary | ICD-10-CM | POA: Insufficient documentation

## 2019-04-27 DIAGNOSIS — C651 Malignant neoplasm of right renal pelvis: Secondary | ICD-10-CM

## 2019-04-27 DIAGNOSIS — R309 Painful micturition, unspecified: Secondary | ICD-10-CM | POA: Insufficient documentation

## 2019-04-27 DIAGNOSIS — Z79899 Other long term (current) drug therapy: Secondary | ICD-10-CM | POA: Diagnosis not present

## 2019-04-27 DIAGNOSIS — Z905 Acquired absence of kidney: Secondary | ICD-10-CM | POA: Diagnosis not present

## 2019-04-27 DIAGNOSIS — Z95828 Presence of other vascular implants and grafts: Secondary | ICD-10-CM

## 2019-04-27 LAB — CMP (CANCER CENTER ONLY)
ALT: 41 U/L (ref 0–44)
AST: 29 U/L (ref 15–41)
Albumin: 3.9 g/dL (ref 3.5–5.0)
Alkaline Phosphatase: 48 U/L (ref 38–126)
Anion gap: 6 (ref 5–15)
BUN: 24 mg/dL — ABNORMAL HIGH (ref 8–23)
CO2: 24 mmol/L (ref 22–32)
Calcium: 8.5 mg/dL — ABNORMAL LOW (ref 8.9–10.3)
Chloride: 107 mmol/L (ref 98–111)
Creatinine: 1.93 mg/dL — ABNORMAL HIGH (ref 0.61–1.24)
GFR, Est AFR Am: 42 mL/min — ABNORMAL LOW (ref >60)
GFR, Estimated: 36 mL/min — ABNORMAL LOW (ref >60)
Glucose, Bld: 186 mg/dL — ABNORMAL HIGH (ref 70–99)
Potassium: 4.6 mmol/L (ref 3.5–5.1)
Sodium: 137 mmol/L (ref 135–145)
Total Bilirubin: 0.5 mg/dL (ref 0.3–1.2)
Total Protein: 6 g/dL — ABNORMAL LOW (ref 6.5–8.1)

## 2019-04-27 LAB — CBC WITH DIFFERENTIAL (CANCER CENTER ONLY)
Abs Immature Granulocytes: 0.01 10*3/uL (ref 0.00–0.07)
Basophils Absolute: 0 10*3/uL (ref 0.0–0.1)
Basophils Relative: 1 %
Eosinophils Absolute: 0.2 10*3/uL (ref 0.0–0.5)
Eosinophils Relative: 4 %
HCT: 37.9 % — ABNORMAL LOW (ref 39.0–52.0)
Hemoglobin: 12.4 g/dL — ABNORMAL LOW (ref 13.0–17.0)
Immature Granulocytes: 0 %
Lymphocytes Relative: 28 %
Lymphs Abs: 1.1 10*3/uL (ref 0.7–4.0)
MCH: 30.5 pg (ref 26.0–34.0)
MCHC: 32.7 g/dL (ref 30.0–36.0)
MCV: 93.3 fL (ref 80.0–100.0)
Monocytes Absolute: 0.4 10*3/uL (ref 0.1–1.0)
Monocytes Relative: 11 %
Neutro Abs: 2.2 10*3/uL (ref 1.7–7.7)
Neutrophils Relative %: 56 %
Platelet Count: 105 10*3/uL — ABNORMAL LOW (ref 150–400)
RBC: 4.06 MIL/uL — ABNORMAL LOW (ref 4.22–5.81)
RDW: 13.6 % (ref 11.5–15.5)
WBC Count: 3.8 10*3/uL — ABNORMAL LOW (ref 4.0–10.5)
nRBC: 0 % (ref 0.0–0.2)

## 2019-04-27 MED ORDER — PHENAZOPYRIDINE HCL 95 MG PO TABS: 95.0000 mg | ORAL_TABLET | Freq: Two times a day (BID) | ORAL | 6 refills | Status: DC

## 2019-04-27 MED ORDER — SODIUM CHLORIDE 0.9% FLUSH
10.0000 mL | Freq: Once | INTRAVENOUS | Status: AC
  Administered 2019-04-27: 10 mL via INTRAVENOUS
  Filled 2019-04-27: qty 10

## 2019-04-27 MED ORDER — HEPARIN SOD (PORK) LOCK FLUSH 100 UNIT/ML IV SOLN
500.0000 [IU] | Freq: Once | INTRAVENOUS | Status: AC
  Administered 2019-04-27: 500 [IU] via INTRAVENOUS
  Filled 2019-04-27: qty 5

## 2019-04-27 MED ORDER — URIBEL 118 MG PO CAPS: 1.0000 | ORAL_CAPSULE | Freq: Four times a day (QID) | ORAL | 6 refills | Status: DC

## 2019-04-27 MED FILL — SM URINARY PAIN RLF 95 MG T: 95 | 30 days supply | Qty: 60 | Fill #0

## 2019-04-27 NOTE — Progress Notes (Signed)
Hematology and Oncology Follow Up Visit  DAIMIAN [name] [id number] 05-20-1955 63 y.o. 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 is here today for follow-up.  His biggest complaint is that he is still having a lot of pain and burning with urination and with the bladder.  He had intravesicular BCG that was done back in October.  He has had this because a lot of issues for him.  His allergies have a lot of bladder spasms.  As far as his actual upper urothelial cancer, he is doing well with this.  He had a PET scan that was done a week or so ago.  Thankfully, the PET scan did not show any evidence of recurrence of the urothelial cancer of the right hilum.  His blood sugars are still on the higher side.  He has had no bleeding.  He has had no cough.  There has had no rashes.   He has had no issues with his bowels.  Overall, his performance status is ECOG 1.     Medications:  Allergies as of 04/27/2019      Reactions   Hydrocodone Itching, Nausea And Vomiting   Dilaudid [hydromorphone] Nausea And Vomiting      Medication List       Accurate as of April 27, 2019  1:02 PM. If you have any questions, ask your nurse or doctor.        STOP taking these medications   multivitamin with minerals Tabs tablet Stopped by: [name], MD     TAKE these medications   ALPRAZolam 1 MG tablet Commonly known as: XANAX Take 1 tablet (1 mg total) by mouth 3 (three) times daily as needed for anxiety.   aspirin EC 81 MG tablet Take 81 mg by mouth at bedtime.   diazepam 10 MG tablet Commonly known as: VALIUM Take 10 mg by mouth See admin instructions. Take one tablet (10 mg) by mouth one hour prior to bladder procedure or BCG     glimepiride 2 MG tablet Commonly known as: AMARYL TAKE 1 BY MOUTH DAILY BEFORE SUPPER What changed:   how much to take  how to take this  when to take this  additional instructions   isosorbide mononitrate 120 MG 24 hr tablet Commonly known as: IMDUR TAKE 1 TABLET BY MOUTH DAILY. GENERIC EQUIVALENT FOR IMDUR   ketoconazole 2 % cream Commonly known as: NIZORAL Apply 1 application topically daily.   metoprolol succinate 25 MG 24 hr tablet Commonly known as: TOPROL-XL Take 1 tablet (25 mg total) by mouth at bedtime.   nitroGLYCERIN 0.4 MG SL tablet Commonly known as: NITROSTAT Place 1 tablet (0.4 mg total) under the tongue every 5 (five) minutes as needed for chest pain.   NovoLIN N ReliOn 100 UNIT/ML injection Generic drug: insulin NPH Human INJECT 30 UNITS SUBCUTANEOUSLY IN THE MORNING AND 20 IN THE EVENING   pantoprazole 40 MG tablet Commonly known as: PROTONIX Take 1 tablet (40 mg total) by mouth every morning.   phenazopyridine 95 MG tablet Commonly known as: PYRIDIUM Take 1 tablet (95 mg total) by mouth 2 (two) times daily.   PRESCRIPTION MEDICATION Inhale into the lungs at bedtime. CPAP   rOPINIRole 2 MG tablet Commonly known as: REQUIP TAKE 1 TABLET BY MOUTH AT BEDTIME  rosuvastatin 10 MG tablet Commonly known as: CRESTOR TAKE 1 TAB (10 MG) BY MOUTH EVERY OTHER DAY   saccharomyces boulardii 250 MG capsule Commonly known as: FLORASTOR Take 250 mg by mouth daily.   tamsulosin 0.4 MG Caps capsule Commonly known as: FLOMAX Take 0.4 mg by mouth daily.   Toujeo SoloStar 300 UNIT/ML Sopn Generic drug: Insulin Glargine (1 Unit Dial) Inject into the skin. 04/27/2019 30U in am and 20U pm.   Uribel 118 MG Caps Take 1 capsule (118 mg total) by mouth 4 (four) times daily. What changed: how much to take Changed by: [name], MD   Victoza 18 MG/3ML Sopn Generic drug: liraglutide Inject into the skin daily.       Allergies:  Allergies   Allergen Reactions  . Hydrocodone Itching and Nausea And Vomiting  . Dilaudid [Hydromorphone] Nausea And Vomiting    Past Medical History, Surgical history, Social history, and Family History were reviewed and updated.  Review of Systems: Review of Systems  Constitutional: Negative.   HENT: Positive for tinnitus.   Eyes: Negative.   Respiratory: Negative.   Cardiovascular: Negative.   Gastrointestinal: Positive for nausea.  Genitourinary: Negative.   Musculoskeletal: Negative.   Skin: Negative.   Neurological: Negative.   Endo/Heme/Allergies: Negative.   Psychiatric/Behavioral: Negative.      Physical Exam:  weight is 220 lb (99.8 kg). His temporal temperature is 97.5 F (36.4 C) (abnormal). His blood pressure is 149/79 (abnormal) and his pulse is 74. His respiration is 20 and oxygen saturation is 98%.   Wt Readings from Last 3 Encounters:  04/27/19 220 lb (99.8 kg)  03/14/19 212 lb (96.2 kg)  01/07/19 208 lb (94.3 kg)    Physical Exam Vitals reviewed.  HENT:     Head: Normocephalic and atraumatic.  Eyes:     Pupils: Pupils are equal, round, and reactive to light.  Cardiovascular:     Rate and Rhythm: Normal rate and regular rhythm.     Heart sounds: Normal heart sounds.  Pulmonary:     Effort: Pulmonary effort is normal.     Breath sounds: Normal breath sounds.  Abdominal:     General: Bowel sounds are normal.     Palpations: Abdomen is soft.  Musculoskeletal:        General: No tenderness or deformity. Normal range of motion.     Cervical back: Normal range of motion.  Lymphadenopathy:     Cervical: No cervical adenopathy.  Skin:    General: Skin is warm and dry.     Findings: No erythema or rash.  Neurological:     Mental Status: He is alert and oriented to person, place, and time.  Psychiatric:        Behavior: Behavior normal.        Thought Content: Thought content normal.        Judgment: Judgment normal.     Lab Results  Component Value  Date   WBC 3.8 (L) 04/27/2019   HGB 12.4 (L) 04/27/2019   HCT 37.9 (L) 04/27/2019   MCV 93.3 04/27/2019   PLT 105 (L) 04/27/2019   Lab Results  Component Value Date   FERRITIN 52 07/06/2018   IRON 98 07/06/2018   TIBC 371 07/06/2018   UIBC 273 07/06/2018   IRONPCTSAT 27 07/06/2018   Lab Results  Component Value Date   RETICCTPCT 2.2 (H) 10/15/2017   RBC 4.06 (L) 04/27/2019   No results found for: KPAFRELGTCHN, LAMBDASER, KAPLAMBRATIO Lab Results  Component Value Date   IGA 232 01/25/2015   No results found for: Odetta Pink, SPEI   Chemistry      Component Value Date/Time   NA 137 04/27/2019 1133   NA 134 05/04/2017 0925   K 4.6 04/27/2019 1133   K 4.5 05/04/2017 0925   CL 107 04/27/2019 1133   CL 97 (L) 05/04/2017 0925   CO2 24 04/27/2019 1133   CO2 23 05/04/2017 0925   BUN 24 (H) 04/27/2019 1133   BUN 28 (H) 05/04/2017 0925   CREATININE 1.93 (H) 04/27/2019 1133   CREATININE 1.95 (H) 03/07/2019 1038   GLU 86 03/31/2016 0000      Component Value Date/Time   CALCIUM 8.5 (L) 04/27/2019 1133   CALCIUM 9.3 05/04/2017 0925   ALKPHOS 48 04/27/2019 1133   ALKPHOS 58 05/04/2017 0925   AST 29 04/27/2019 1133   ALT 41 04/27/2019 1133   ALT 90 (H) 05/04/2017 0925   BILITOT 0.5 04/27/2019 1133      Impression and Plan: [name] is a very pleasant 63 yo caucasian gentleman with infiltrating high grade papillary urothelial carcinoma with right nephrectomy in October 2018.   At this point, we are in a surveillance mode.  I think we move his scans out to every 4 months now.  I think this would be reasonable.  We will plan to see him back in 4 months.  He still has a Port-A-Cath in.  We will flush it and 2 months.  Hopefully, he will have less issues with his bladder as time goes on.   [name], MD 12/16/20201:02 PM

## 2019-05-17 DIAGNOSIS — R310 Gross hematuria: Secondary | ICD-10-CM | POA: Diagnosis not present

## 2019-05-17 DIAGNOSIS — C678 Malignant neoplasm of overlapping sites of bladder: Secondary | ICD-10-CM | POA: Diagnosis not present

## 2019-05-17 DIAGNOSIS — R3912 Poor urinary stream: Secondary | ICD-10-CM | POA: Diagnosis not present

## 2019-05-17 DIAGNOSIS — N401 Enlarged prostate with lower urinary tract symptoms: Secondary | ICD-10-CM | POA: Diagnosis not present

## 2019-05-17 DIAGNOSIS — N410 Acute prostatitis: Secondary | ICD-10-CM | POA: Diagnosis not present

## 2019-05-17 MED FILL — DIAZEPAM 10 MG TABS: 10 | 30 days supply | Qty: 30 | Fill #0

## 2019-05-17 MED FILL — MELOXICAM 7.5 MG TABLET: 7.5 | 14 days supply | Qty: 14 | Fill #0

## 2019-05-17 MED FILL — AMOX-CLAV 500-125 MG TABLET: 500-125 | 3 days supply | Qty: 6 | Fill #0

## 2019-05-20 MED FILL — AMOX-CLAV 500-125 MG TABLET: 500-125 | 30 days supply | Qty: 60 | Fill #0

## 2019-05-27 ENCOUNTER — Other Ambulatory Visit: Payer: Self-pay | Admitting: Family

## 2019-06-06 MED FILL — TAMSULOSIN HCL 0.4 MG CAP: 0.4 | 30 days supply | Qty: 60 | Fill #0

## 2019-06-13 DIAGNOSIS — M62838 Other muscle spasm: Secondary | ICD-10-CM | POA: Diagnosis not present

## 2019-06-13 DIAGNOSIS — M6289 Other specified disorders of muscle: Secondary | ICD-10-CM | POA: Diagnosis not present

## 2019-06-13 DIAGNOSIS — N393 Stress incontinence (female) (male): Secondary | ICD-10-CM | POA: Diagnosis not present

## 2019-06-13 DIAGNOSIS — M6281 Muscle weakness (generalized): Secondary | ICD-10-CM | POA: Diagnosis not present

## 2019-06-14 DIAGNOSIS — C678 Malignant neoplasm of overlapping sites of bladder: Secondary | ICD-10-CM | POA: Diagnosis not present

## 2019-06-14 MED FILL — FINASTERIDE 5 MG TABLET: 5 | 90 days supply | Qty: 90 | Fill #0

## 2019-06-22 ENCOUNTER — Inpatient Hospital Stay: Payer: Medicare Other | Attending: Hematology & Oncology

## 2019-06-22 ENCOUNTER — Other Ambulatory Visit: Payer: Self-pay

## 2019-06-22 VITALS — BP 171/84 | HR 100 | Temp 97.8°F | Resp 18

## 2019-06-22 DIAGNOSIS — Z452 Encounter for adjustment and management of vascular access device: Secondary | ICD-10-CM | POA: Insufficient documentation

## 2019-06-22 DIAGNOSIS — Z8551 Personal history of malignant neoplasm of bladder: Secondary | ICD-10-CM | POA: Diagnosis not present

## 2019-06-22 DIAGNOSIS — D508 Other iron deficiency anemias: Secondary | ICD-10-CM

## 2019-06-22 MED ORDER — HEPARIN SOD (PORK) LOCK FLUSH 100 UNIT/ML IV SOLN
500.0000 [IU] | Freq: Once | INTRAVENOUS | Status: AC | PRN
  Administered 2019-06-22: 12:00:00 500 [IU]
  Filled 2019-06-22: qty 5

## 2019-06-22 MED ORDER — SODIUM CHLORIDE 0.9% FLUSH
10.0000 mL | Freq: Once | INTRAVENOUS | Status: AC | PRN
  Administered 2019-06-22: 12:00:00 10 mL
  Filled 2019-06-22: qty 10

## 2019-06-29 DIAGNOSIS — R350 Frequency of micturition: Secondary | ICD-10-CM | POA: Diagnosis not present

## 2019-06-29 DIAGNOSIS — R339 Retention of urine, unspecified: Secondary | ICD-10-CM | POA: Diagnosis not present

## 2019-06-29 DIAGNOSIS — N3941 Urge incontinence: Secondary | ICD-10-CM | POA: Diagnosis not present

## 2019-06-29 DIAGNOSIS — M62838 Other muscle spasm: Secondary | ICD-10-CM | POA: Diagnosis not present

## 2019-07-05 DIAGNOSIS — M6281 Muscle weakness (generalized): Secondary | ICD-10-CM | POA: Diagnosis not present

## 2019-07-05 DIAGNOSIS — M62838 Other muscle spasm: Secondary | ICD-10-CM | POA: Diagnosis not present

## 2019-07-05 DIAGNOSIS — N393 Stress incontinence (female) (male): Secondary | ICD-10-CM | POA: Diagnosis not present

## 2019-07-05 DIAGNOSIS — M6289 Other specified disorders of muscle: Secondary | ICD-10-CM | POA: Diagnosis not present

## 2019-07-08 DIAGNOSIS — E119 Type 2 diabetes mellitus without complications: Secondary | ICD-10-CM | POA: Diagnosis not present

## 2019-07-08 DIAGNOSIS — Z794 Long term (current) use of insulin: Secondary | ICD-10-CM | POA: Diagnosis not present

## 2019-07-11 DIAGNOSIS — Z794 Long term (current) use of insulin: Secondary | ICD-10-CM | POA: Diagnosis not present

## 2019-07-11 DIAGNOSIS — E119 Type 2 diabetes mellitus without complications: Secondary | ICD-10-CM | POA: Diagnosis not present

## 2019-07-15 DIAGNOSIS — N3941 Urge incontinence: Secondary | ICD-10-CM | POA: Diagnosis not present

## 2019-07-15 DIAGNOSIS — N393 Stress incontinence (female) (male): Secondary | ICD-10-CM | POA: Diagnosis not present

## 2019-07-15 DIAGNOSIS — R350 Frequency of micturition: Secondary | ICD-10-CM | POA: Diagnosis not present

## 2019-07-15 DIAGNOSIS — R300 Dysuria: Secondary | ICD-10-CM | POA: Diagnosis not present

## 2019-07-15 MED FILL — TAMSULOSIN HCL 0.4 MG CAP: 0.4 | 30 days supply | Qty: 60 | Fill #1

## 2019-08-11 MED FILL — TAMSULOSIN HCL 0.4 MG CAP: 0.4 | 30 days supply | Qty: 60 | Fill #2

## 2019-08-12 ENCOUNTER — Encounter (HOSPITAL_COMMUNITY)
Admission: RE | Admit: 2019-08-12 | Discharge: 2019-08-12 | Disposition: A | Discharge status: Home / Self Care | Payer: Medicare Other | Source: Ambulatory Visit | Attending: Hematology & Oncology | Admitting: Hematology & Oncology

## 2019-08-12 ENCOUNTER — Other Ambulatory Visit: Payer: Self-pay

## 2019-08-12 ENCOUNTER — Telehealth: Payer: Self-pay | Admitting: *Deleted

## 2019-08-12 DIAGNOSIS — K760 Fatty (change of) liver, not elsewhere classified: Secondary | ICD-10-CM | POA: Insufficient documentation

## 2019-08-12 DIAGNOSIS — C651 Malignant neoplasm of right renal pelvis: Secondary | ICD-10-CM | POA: Diagnosis not present

## 2019-08-12 DIAGNOSIS — I2510 Atherosclerotic heart disease of native coronary artery without angina pectoris: Secondary | ICD-10-CM | POA: Diagnosis not present

## 2019-08-12 DIAGNOSIS — K5730 Diverticulosis of large intestine without perforation or abscess without bleeding: Secondary | ICD-10-CM | POA: Diagnosis not present

## 2019-08-12 DIAGNOSIS — C659 Malignant neoplasm of unspecified renal pelvis: Secondary | ICD-10-CM | POA: Diagnosis not present

## 2019-08-12 DIAGNOSIS — Z905 Acquired absence of kidney: Secondary | ICD-10-CM | POA: Diagnosis not present

## 2019-08-12 DIAGNOSIS — K4090 Unilateral inguinal hernia, without obstruction or gangrene, not specified as recurrent: Secondary | ICD-10-CM | POA: Diagnosis not present

## 2019-08-12 DIAGNOSIS — C679 Malignant neoplasm of bladder, unspecified: Secondary | ICD-10-CM | POA: Diagnosis not present

## 2019-08-12 LAB — GLUCOSE, CAPILLARY: Glucose-Capillary: 151 mg/dL — ABNORMAL HIGH (ref 70–99)

## 2019-08-12 MED ORDER — FLUDEOXYGLUCOSE F - 18 (FDG) INJECTION
10.7700 | Freq: Once | INTRAVENOUS | Status: AC
  Administered 2019-08-12: 10.77 via INTRAVENOUS

## 2019-08-12 NOTE — Telephone Encounter (Signed)
-----   Message from [name], MD sent at 08/12/2019  1:14 PM EDT ----- Call - No recurrent bladder cancer!!  Isaac [name]

## 2019-08-12 NOTE — Telephone Encounter (Signed)
Message left to notify patient per order of Dr. [name] that there is "no recurrent bladder cancer!!"  Instructed pt to call office back with any questions or concerns.

## 2019-08-15 ENCOUNTER — Other Ambulatory Visit: Payer: Self-pay | Admitting: Internal Medicine

## 2019-08-19 ENCOUNTER — Inpatient Hospital Stay: Payer: Medicare Other | Attending: Hematology & Oncology

## 2019-08-19 ENCOUNTER — Inpatient Hospital Stay (HOSPITAL_BASED_OUTPATIENT_CLINIC_OR_DEPARTMENT_OTHER): Payer: Medicare Other | Admitting: Hematology & Oncology

## 2019-08-19 ENCOUNTER — Encounter: Payer: Self-pay | Admitting: Hematology & Oncology

## 2019-08-19 ENCOUNTER — Inpatient Hospital Stay: Payer: Medicare Other

## 2019-08-19 ENCOUNTER — Other Ambulatory Visit: Payer: Self-pay

## 2019-08-19 VITALS — Wt 211.0 lb

## 2019-08-19 VITALS — BP 126/77 | HR 81 | Temp 97.7°F | Resp 18 | Ht 69.0 in | Wt 215.0 lb

## 2019-08-19 DIAGNOSIS — Z79899 Other long term (current) drug therapy: Secondary | ICD-10-CM | POA: Insufficient documentation

## 2019-08-19 DIAGNOSIS — Z8551 Personal history of malignant neoplasm of bladder: Secondary | ICD-10-CM | POA: Diagnosis not present

## 2019-08-19 DIAGNOSIS — Z9221 Personal history of antineoplastic chemotherapy: Secondary | ICD-10-CM | POA: Insufficient documentation

## 2019-08-19 DIAGNOSIS — Z7982 Long term (current) use of aspirin: Secondary | ICD-10-CM | POA: Insufficient documentation

## 2019-08-19 DIAGNOSIS — C670 Malignant neoplasm of trigone of bladder: Secondary | ICD-10-CM

## 2019-08-19 DIAGNOSIS — F419 Anxiety disorder, unspecified: Secondary | ICD-10-CM

## 2019-08-19 DIAGNOSIS — Z905 Acquired absence of kidney: Secondary | ICD-10-CM | POA: Insufficient documentation

## 2019-08-19 DIAGNOSIS — R112 Nausea with vomiting, unspecified: Secondary | ICD-10-CM

## 2019-08-19 DIAGNOSIS — C651 Malignant neoplasm of right renal pelvis: Secondary | ICD-10-CM

## 2019-08-19 DIAGNOSIS — Z95828 Presence of other vascular implants and grafts: Secondary | ICD-10-CM

## 2019-08-19 DIAGNOSIS — Z452 Encounter for adjustment and management of vascular access device: Secondary | ICD-10-CM | POA: Diagnosis not present

## 2019-08-19 LAB — CBC WITH DIFFERENTIAL (CANCER CENTER ONLY)
Abs Immature Granulocytes: 0.01 10*3/uL (ref 0.00–0.07)
Basophils Absolute: 0 10*3/uL (ref 0.0–0.1)
Basophils Relative: 0 %
Eosinophils Absolute: 0.1 10*3/uL (ref 0.0–0.5)
Eosinophils Relative: 3 %
HCT: 39 % (ref 39.0–52.0)
Hemoglobin: 13.1 g/dL (ref 13.0–17.0)
Immature Granulocytes: 0 %
Lymphocytes Relative: 29 %
Lymphs Abs: 1 10*3/uL (ref 0.7–4.0)
MCH: 31.5 pg (ref 26.0–34.0)
MCHC: 33.6 g/dL (ref 30.0–36.0)
MCV: 93.8 fL (ref 80.0–100.0)
Monocytes Absolute: 0.4 10*3/uL (ref 0.1–1.0)
Monocytes Relative: 12 %
Neutro Abs: 1.8 10*3/uL (ref 1.7–7.7)
Neutrophils Relative %: 56 %
Platelet Count: 111 10*3/uL — ABNORMAL LOW (ref 150–400)
RBC: 4.16 MIL/uL — ABNORMAL LOW (ref 4.22–5.81)
RDW: 13.2 % (ref 11.5–15.5)
WBC Count: 3.3 10*3/uL — ABNORMAL LOW (ref 4.0–10.5)
nRBC: 0 % (ref 0.0–0.2)

## 2019-08-19 LAB — CMP (CANCER CENTER ONLY)
ALT: 44 U/L (ref 0–44)
AST: 33 U/L (ref 15–41)
Albumin: 4.1 g/dL (ref 3.5–5.0)
Alkaline Phosphatase: 46 U/L (ref 38–126)
Anion gap: 8 (ref 5–15)
BUN: 28 mg/dL — ABNORMAL HIGH (ref 8–23)
CO2: 24 mmol/L (ref 22–32)
Calcium: 8.7 mg/dL — ABNORMAL LOW (ref 8.9–10.3)
Chloride: 103 mmol/L (ref 98–111)
Creatinine: 1.79 mg/dL — ABNORMAL HIGH (ref 0.61–1.24)
GFR, Est AFR Am: 45 mL/min — ABNORMAL LOW
GFR, Estimated: 39 mL/min — ABNORMAL LOW
Glucose, Bld: 296 mg/dL — ABNORMAL HIGH (ref 70–99)
Potassium: 4.3 mmol/L (ref 3.5–5.1)
Sodium: 135 mmol/L (ref 135–145)
Total Bilirubin: 0.6 mg/dL (ref 0.3–1.2)
Total Protein: 6.1 g/dL — ABNORMAL LOW (ref 6.5–8.1)

## 2019-08-19 MED ORDER — AMOXICILLIN 500 MG PO TABS: 2000.0000 mg | ORAL_TABLET | Freq: Once | ORAL | 4 refills | Status: AC

## 2019-08-19 MED ORDER — SODIUM CHLORIDE 0.9% FLUSH
10.0000 mL | Freq: Once | INTRAVENOUS | Status: AC
  Administered 2019-08-19: 11:00:00 10 mL via INTRAVENOUS
  Filled 2019-08-19: qty 10

## 2019-08-19 MED ORDER — HEPARIN SOD (PORK) LOCK FLUSH 100 UNIT/ML IV SOLN
500.0000 [IU] | Freq: Once | INTRAVENOUS | Status: AC
  Administered 2019-08-19: 500 [IU] via INTRAVENOUS
  Filled 2019-08-19: qty 5

## 2019-08-19 MED ORDER — ALPRAZOLAM 1 MG PO TABS: 1.0000 mg | ORAL_TABLET | Freq: Three times a day (TID) | ORAL | 0 refills | Status: DC | PRN

## 2019-08-19 MED FILL — AMOXICILLIN 500 MG CAPSULE: 500 | 3 days supply | Qty: 12 | Fill #0

## 2019-08-19 MED FILL — ALPRAZolam 1 MG TABS: 1 | 30 days supply | Qty: 90 | Fill #0

## 2019-08-19 NOTE — Progress Notes (Signed)
Hematology and Oncology Follow Up Visit  Isaac [name] [id number] 30-Jun-1955 64 y.o. 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 is here today for follow-up.  Overall, he seems to be doing pretty well.  His blood sugars are still on the high side.  Hopefully, he'll be able to get these under better control.  We did go ahead and do a PET scan on him.  This was done earlier this week.  The PET scan did not show any evidence of metastatic or recurrent urothelial carcinoma.  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  He has been seen by urology for this.  I'm not sure when he goes back for another cystoscopy.  I think he has been getting intravesicular BCG.  He has had no problems with bleeding.  He has had no fever.  He has been very diligent with avoiding the coronavirus.  I think he has had his vaccinations.  Overall, his performance status is ECOG 1.     Medications:  Allergies as of 08/19/2019      Reactions   Hydrocodone Itching, Nausea And Vomiting   Dilaudid [hydromorphone] Nausea And Vomiting      Medication List       Accurate as of August 19, 2019 11:39 AM. If you have any questions, ask your nurse or doctor.        ALPRAZolam 1 MG tablet Commonly known as: XANAX Take 1 tablet (1 mg total) by mouth 3 (three) times daily as needed for anxiety.   aspirin EC 81 MG tablet Take 81 mg by mouth at bedtime.   diazepam 10 MG tablet Commonly known as: VALIUM Take 10 mg by mouth See admin instructions. Take one tablet (10 mg) by mouth one hour prior to bladder procedure or BCG   glimepiride 2 MG tablet Commonly known as: AMARYL TAKE 1 BY MOUTH DAILY BEFORE SUPPER What changed:   how much to take  how to take this  when  to take this  additional instructions   isosorbide mononitrate 120 MG 24 hr tablet Commonly known as: IMDUR TAKE 1 TABLET BY MOUTH DAILY. GENERIC EQUIVALENT FOR IMDUR   ketoconazole 2 % cream Commonly known as: NIZORAL Apply 1 application topically daily.   metoprolol succinate 25 MG 24 hr tablet Commonly known as: TOPROL-XL TAKE 1 TABLET BY MOUTH AT BEDTIME. GENERIC EQUIVALENT FOR TOPROL-XL   nitroGLYCERIN 0.4 MG SL tablet Commonly known as: NITROSTAT Place 1 tablet (0.4 mg total) under the tongue every 5 (five) minutes as needed for chest pain.   NovoLIN N ReliOn 100 UNIT/ML injection Generic drug: insulin NPH Human INJECT 30 UNITS SUBCUTANEOUSLY IN THE MORNING AND 20 IN THE EVENING   pantoprazole 40 MG tablet Commonly known as: PROTONIX Take 1 tablet (40 mg total) by mouth every morning.   phenazopyridine 95 MG tablet Commonly known as: PYRIDIUM Take 1 tablet (95 mg total) by mouth 2 (two) times daily.   PRESCRIPTION MEDICATION Inhale into the lungs at bedtime. CPAP   rOPINIRole 2 MG tablet Commonly known as: REQUIP TAKE 1 TABLET BY MOUTH AT BEDTIME   rosuvastatin 10 MG tablet Commonly known as: CRESTOR TAKE 1 TAB (10 MG) BY MOUTH EVERY OTHER DAY   saccharomyces boulardii 250 MG capsule Commonly known as: FLORASTOR Take 250 mg by  mouth daily.   tamsulosin 0.4 MG Caps capsule Commonly known as: FLOMAX Take 0.4 mg by mouth daily.   Toujeo SoloStar 300 UNIT/ML Solostar Pen Generic drug: insulin glargine (1 Unit Dial) Inject into the skin. 04/27/2019 30U in am and 20U pm.   Uribel 118 MG Caps Take 1 capsule (118 mg total) by mouth 4 (four) times daily.   Victoza 18 MG/3ML Sopn Generic drug: liraglutide Inject into the skin daily.       Allergies:  Allergies  Allergen Reactions  . Hydrocodone Itching and Nausea And Vomiting  . Dilaudid [Hydromorphone] Nausea And Vomiting    Past Medical History, Surgical history, Social history, and Family  History were reviewed and updated.  Review of Systems: Review of Systems  Constitutional: Negative.   HENT: Positive for tinnitus.   Eyes: Negative.   Respiratory: Negative.   Cardiovascular: Negative.   Gastrointestinal: Positive for nausea.  Genitourinary: Negative.   Musculoskeletal: Negative.   Skin: Negative.   Neurological: Negative.   Endo/Heme/Allergies: Negative.   Psychiatric/Behavioral: Negative.      Physical Exam:  weight is 211 lb (95.7 kg).   Wt Readings from Last 3 Encounters:  08/19/19 211 lb (95.7 kg)  08/19/19 215 lb (97.5 kg)  08/12/19 215 lb (97.5 kg)    Physical Exam Vitals reviewed.  HENT:     Head: Normocephalic and atraumatic.  Eyes:     Pupils: Pupils are equal, round, and reactive to light.  Cardiovascular:     Rate and Rhythm: Normal rate and regular rhythm.     Heart sounds: Normal heart sounds.  Pulmonary:     Effort: Pulmonary effort is normal.     Breath sounds: Normal breath sounds.  Abdominal:     General: Bowel sounds are normal.     Palpations: Abdomen is soft.  Musculoskeletal:        General: No tenderness or deformity. Normal range of motion.     Cervical back: Normal range of motion.  Lymphadenopathy:     Cervical: No cervical adenopathy.  Skin:    General: Skin is warm and dry.     Findings: No erythema or rash.  Neurological:     Mental Status: He is alert and oriented to person, place, and time.  Psychiatric:        Behavior: Behavior normal.        Thought Content: Thought content normal.        Judgment: Judgment normal.     Lab Results  Component Value Date   WBC 3.3 (L) 08/19/2019   HGB 13.1 08/19/2019   HCT 39.0 08/19/2019   MCV 93.8 08/19/2019   PLT 111 (L) 08/19/2019   Lab Results  Component Value Date   FERRITIN 52 07/06/2018   IRON 98 07/06/2018   TIBC 371 07/06/2018   UIBC 273 07/06/2018   IRONPCTSAT 27 07/06/2018   Lab Results  Component Value Date   RETICCTPCT 2.2 (H) 10/15/2017   RBC  4.16 (L) 08/19/2019   No results found for: KPAFRELGTCHN, LAMBDASER, KAPLAMBRATIO Lab Results  Component Value Date   IGA 232 01/25/2015   No results found for: Ronnald Ramp, A1GS, A2GS, Violet Baldy, MSPIKE, SPEI   Chemistry      Component Value Date/Time   NA 135 08/19/2019 1029   NA 134 05/04/2017 0925   K 4.3 08/19/2019 1029   K 4.5 05/04/2017 0925   CL 103 08/19/2019 1029   CL 97 (L) 05/04/2017 0925   CO2  24 08/19/2019 1029   CO2 23 05/04/2017 0925   BUN 28 (H) 08/19/2019 1029   BUN 28 (H) 05/04/2017 0925   CREATININE 1.79 (H) 08/19/2019 1029   CREATININE 1.95 (H) 03/07/2019 1038   GLU 86 03/31/2016 0000      Component Value Date/Time   CALCIUM 8.7 (L) 08/19/2019 1029   CALCIUM 9.3 05/04/2017 0925   ALKPHOS 46 08/19/2019 1029   ALKPHOS 58 05/04/2017 0925   AST 33 08/19/2019 1029   ALT 44 08/19/2019 1029   ALT 90 (H) 05/04/2017 0925   BILITOT 0.6 08/19/2019 1029      Impression and Plan: [name] is a very pleasant 64 yo caucasian gentleman with infiltrating high grade papillary urothelial carcinoma with right nephrectomy in October 2018.   It has now been 2 years since he completed his adjuvant chemotherapy.  I think we can probably move his scans out now to every 6 months.  We will see him back in 4 months.  He will get his Port-A-Cath flushed every 2 months.  At this point, we are in a surveillance mode.  Cassell Smiles, MD 4/9/202111:39 AM

## 2019-08-19 NOTE — Patient Instructions (Signed)

## 2019-08-24 DIAGNOSIS — G4733 Obstructive sleep apnea (adult) (pediatric): Secondary | ICD-10-CM | POA: Diagnosis not present

## 2019-08-29 IMAGING — CT NM PET TUM IMG INITIAL (PI) SKULL BASE T - THIGH
7 series · 25 of 25 positions shown · non-contrast
Comparison: December 30, 2016

CLINICAL DATA: High grade papillary urothelial carcinoma status
post right nephrectomy. Staging.

EXAM:
NUCLEAR MEDICINE PET SKULL BASE TO THIGH
TECHNIQUE: 10.2 mCi F-18 FDG was injected intravenously. Full-ring PET imaging
was performed from the skull base to thigh after the radiotracer. CT
data was obtained and used for attenuation correction and anatomic
localization.
FASTING BLOOD GLUCOSE:  Value: 204 mg/dl

[Series 3: pet sk_thigh ac · axial · 5.0mm · 4.07mm/px · z∈[-1274,-346]mm · 6 of 233 slices shown]
[im 1/233]
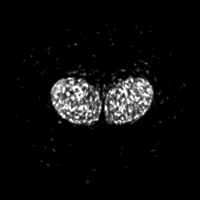
[im 47/233]
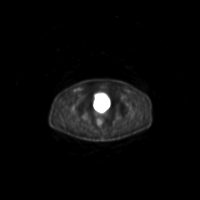
[im 93/233]
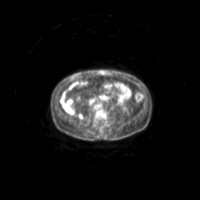
[im 140/233]
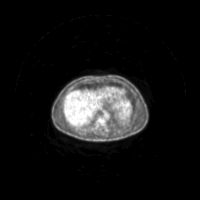
[im 186/233]
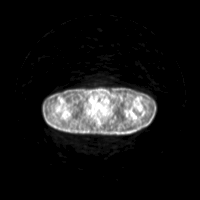
[im 233/233]
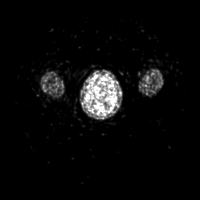

[Series 4: ct sk_thigh 5.0 b31f · axial · 5.0mm · 0.98mm/px · z∈[-1274,-346]mm · 5 of 233 slices shown]
[im 1/233]
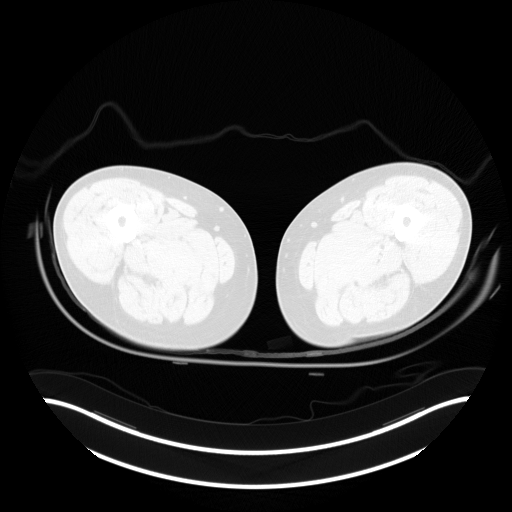
[im 59/233]
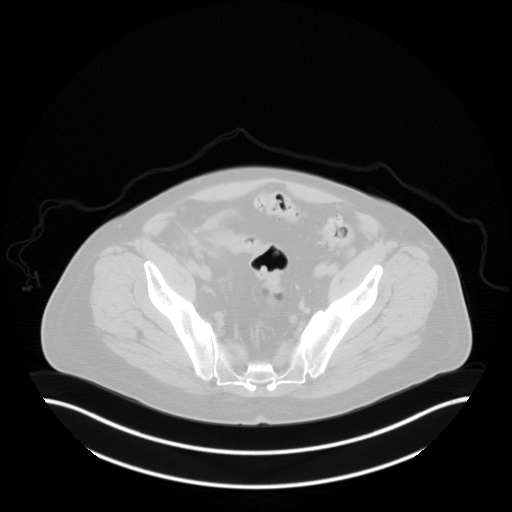
[im 117/233]
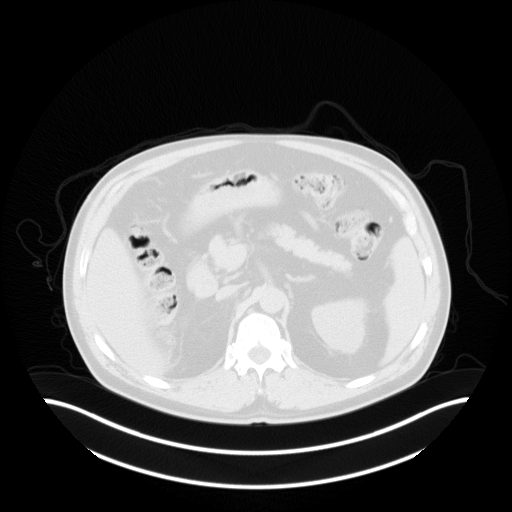
[im 175/233]
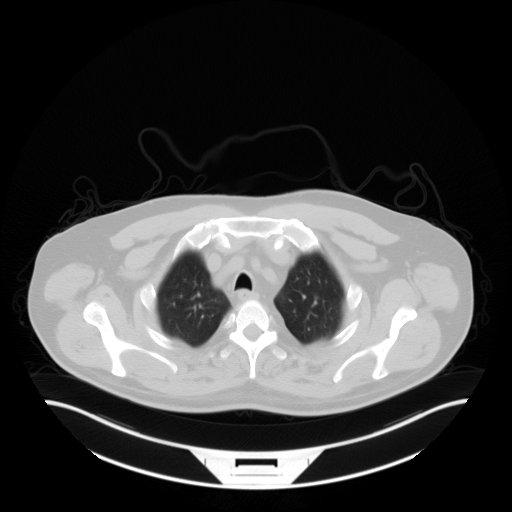
[im 233/233  brain]
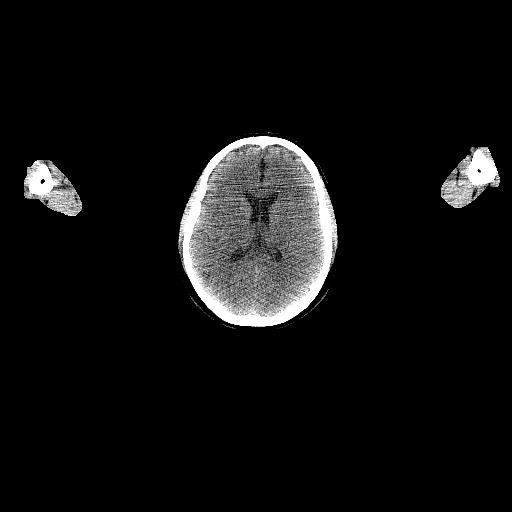

[Series 5: pet sk_thigh nac · axial · 5.0mm · 4.07mm/px · z∈[-1274,-346]mm · 5 of 233 slices shown]
[im 1/233]
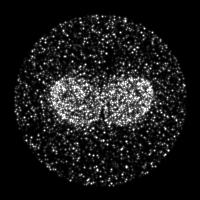
[im 59/233]
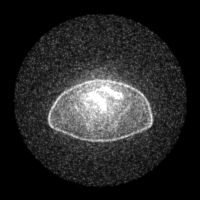
[im 117/233]
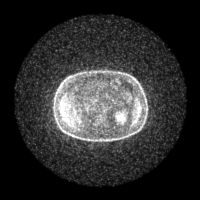
[im 175/233]
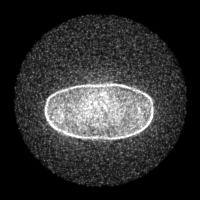
[im 233/233]
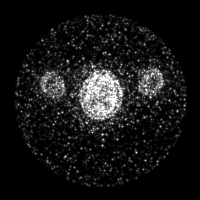

[Series 8: ct sk_thigh 5.0 b70f lung_(id) · axial · 5.0mm · 0.73mm/px · 1 of 56 slices shown]
[im 1/56  bone]
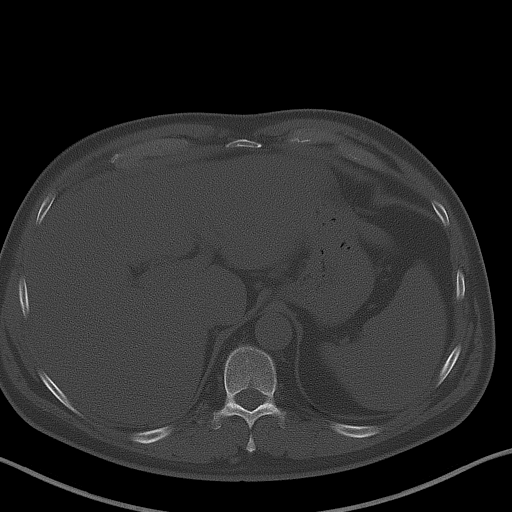

[Series 603: mip range · coronal · 1.93mm/px · 1 of 32 slices shown]
[im 1/32]
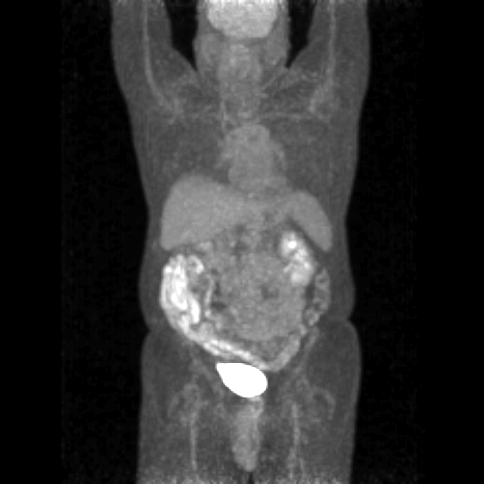

[Series 604: range-ct sk_thigh 5.0 (id)<alpha range> · 2 of 91 slices shown (1 of 2)]
[im 1/91]
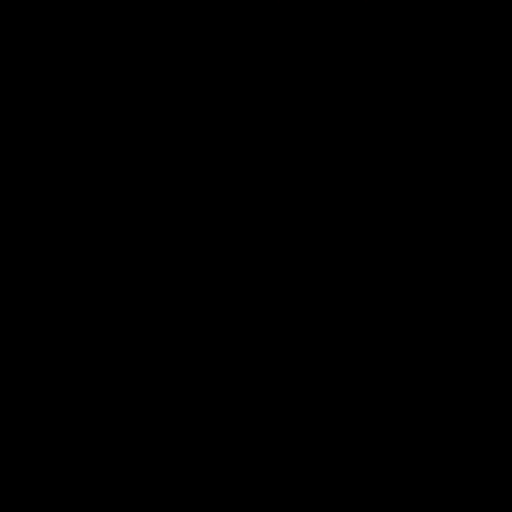
[im 91/91]
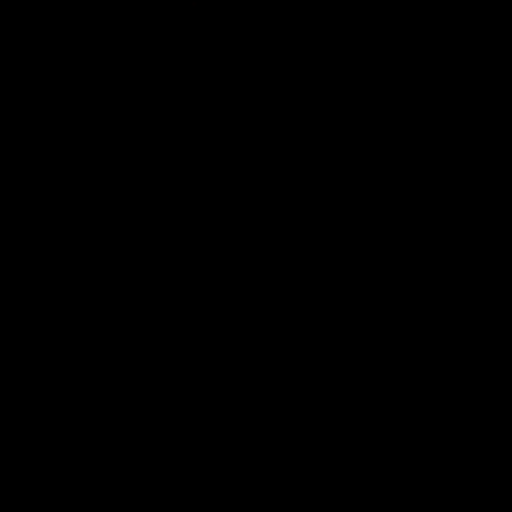

[Series 605: range-ct sk_thigh 5.0 (id)<alpha range> · 5 of 218 slices shown (2 of 2)]
[im 1/218]
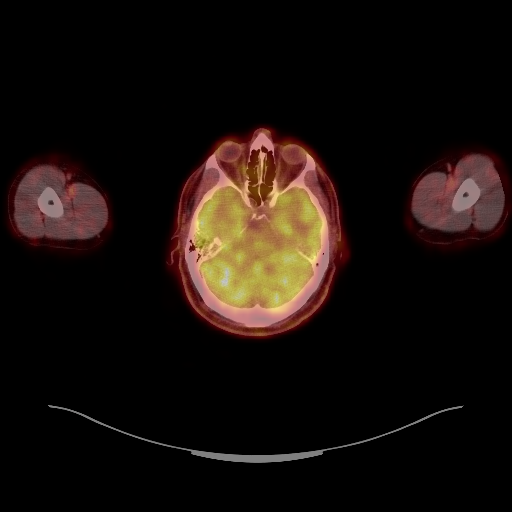
[im 55/218]
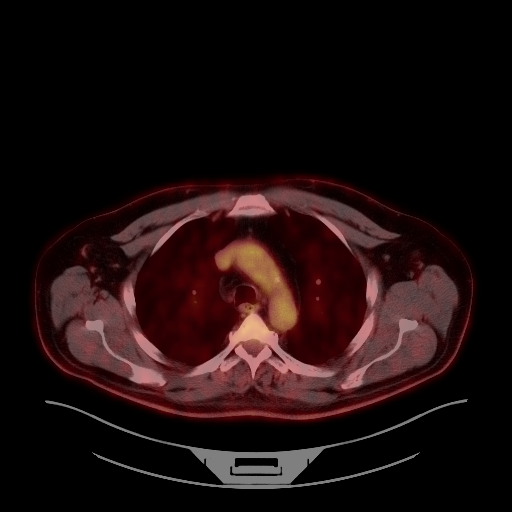
[im 109/218]
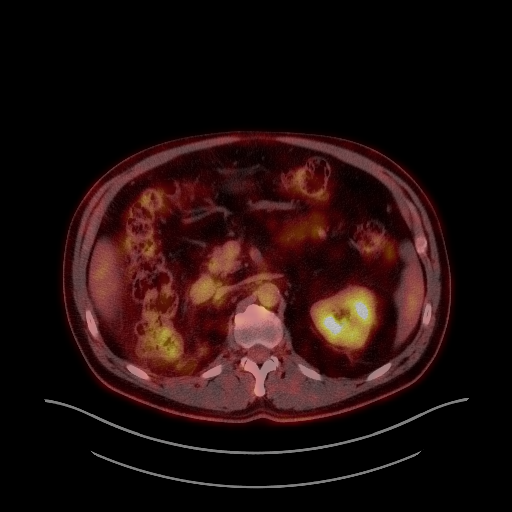
[im 163/218]
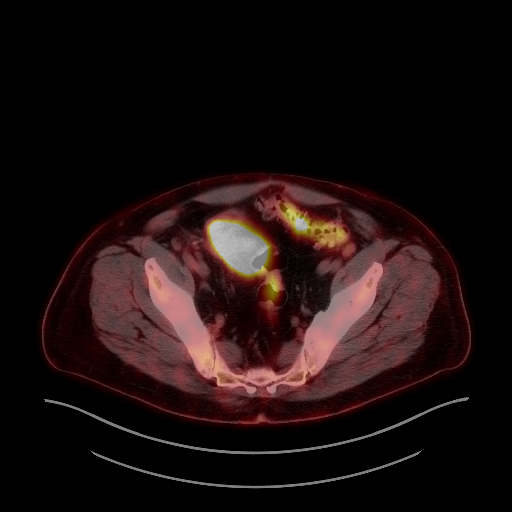
[im 218/218]
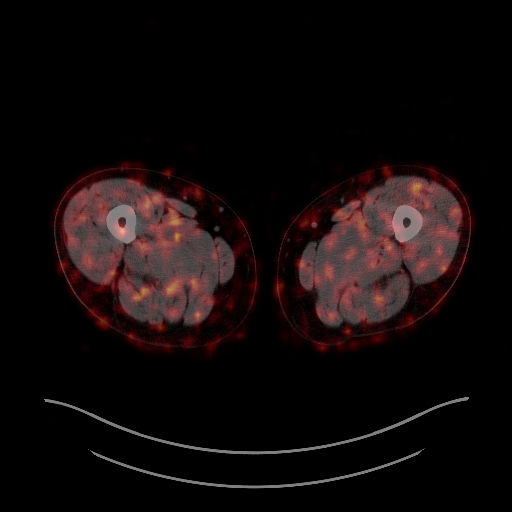

[25 of 25 positions shown; findings below may reference images not displayed]

FINDINGS: NECK: No hypermetabolic lymph nodes in the neck.

CHEST: No hypermetabolic mediastinal or hilar nodes. No suspicious
pulmonary nodules on the CT scan.

ABDOMEN/PELVIS: Mild uptake in the right nephrectomy bed is thought
to be postsurgical. No nodules or masses identified. No abnormal FDG
uptake.

SKELETON: No focal hypermetabolic activity to suggest skeletal
metastasis.
IMPRESSION: No FDG avid malignancy identified on today's study.

## 2019-09-06 MED FILL — FINASTERIDE 5 MG TABLET: 5 | 90 days supply | Qty: 90 | Fill #1

## 2019-09-06 MED FILL — TAMSULOSIN HCL 0.4 MG CAP: 0.4 | 30 days supply | Qty: 60 | Fill #3

## 2019-09-12 DIAGNOSIS — N3941 Urge incontinence: Secondary | ICD-10-CM | POA: Diagnosis not present

## 2019-09-12 DIAGNOSIS — C678 Malignant neoplasm of overlapping sites of bladder: Secondary | ICD-10-CM | POA: Diagnosis not present

## 2019-09-12 DIAGNOSIS — R102 Pelvic and perineal pain: Secondary | ICD-10-CM | POA: Diagnosis not present

## 2019-09-12 MED FILL — DIAZEPAM 10 MG TABS: 10 | 30 days supply | Qty: 30 | Fill #0

## 2019-09-15 MED FILL — TADALAFIL 5 MG TABS: 5 | 30 days supply | Qty: 30 | Fill #0

## 2019-09-16 DIAGNOSIS — Z0120 Encounter for dental examination and cleaning without abnormal findings: Secondary | ICD-10-CM | POA: Diagnosis not present

## 2019-09-22 ENCOUNTER — Other Ambulatory Visit: Payer: Self-pay

## 2019-09-22 ENCOUNTER — Ambulatory Visit: Payer: Medicare Other | Admitting: Internal Medicine

## 2019-09-22 VITALS — BP 120/80 | HR 68 | Ht 69.0 in | Wt 217.2 lb

## 2019-09-22 DIAGNOSIS — I10 Essential (primary) hypertension: Secondary | ICD-10-CM

## 2019-09-22 DIAGNOSIS — I2510 Atherosclerotic heart disease of native coronary artery without angina pectoris: Secondary | ICD-10-CM

## 2019-09-22 NOTE — Progress Notes (Signed)
Cardiology Office Note   Date:  09/22/2019   ID:  Isaac [name], DOB Jun 24, 1955, MRN [id number]  PCP:  Elby Showers, MD  Cardiologist:   [name], MD   F/U of CAD     History of Present Illness: Isaac [name] is a 64 y.o. male with a history of Isaac [name] is a 65 y.o. male with a hx of mild CAD and probable microvascular dz, TIMI II flow in LAD and LCx   He also has a hx of DM, HL, HTN, bladder CAD   Last cath in 2009  Minimal CAD   Myovue in 2014   Low risk  In Feb 2020 he awas admitted with the flu   Dizzy mild chest tightness Cardiology saw him this admit  Did not feel cardiac   Since seen he denies CP   Breathing is OK   Patient has been fatigued some   Finished BCG Rx for bladder Ca   About to start another cycle     Current Meds  Medication Sig  . ALPRAZolam (XANAX) 1 MG tablet Take 1 tablet (1 mg total) by mouth 3 (three) times daily as needed for anxiety.  Marland Kitchen aspirin EC 81 MG tablet Take 81 mg by mouth at bedtime.   . diazepam (VALIUM) 10 MG tablet Take 10 mg by mouth See admin instructions. Take one tablet (10 mg) by mouth one hour prior to bladder procedure or BCG  . finasteride (PROPECIA) 1 MG tablet Take 1 mg by mouth daily.  Marland Kitchen glimepiride (AMARYL) 2 MG tablet TAKE 1 BY MOUTH DAILY BEFORE SUPPER (Patient taking differently: Take 2 mg by mouth 2 (two) times daily before a meal. )  . Insulin Glargine, 1 Unit Dial, (TOUJEO SOLOSTAR) 300 UNIT/ML SOPN Inject into the skin. 04/27/2019 30U in am and 40U pm.  . isosorbide mononitrate (IMDUR) 120 MG 24 hr tablet TAKE 1 TABLET BY MOUTH DAILY. GENERIC EQUIVALENT FOR IMDUR  . ketoconazole (NIZORAL) 2 % cream Apply 1 application topically daily.  Marland Kitchen liraglutide (VICTOZA) 18 MG/3ML SOPN Inject into the skin daily.  . metoprolol succinate (TOPROL-XL) 25 MG 24 hr tablet TAKE 1 TABLET BY MOUTH AT BEDTIME. GENERIC EQUIVALENT FOR TOPROL-XL  . nitroGLYCERIN (NITROSTAT) 0.4 MG SL tablet Place 1 tablet (0.4 mg total)  under the tongue every 5 (five) minutes as needed for chest pain.  . pantoprazole (PROTONIX) 40 MG tablet Take 1 tablet (40 mg total) by mouth every morning.  . phenazopyridine (PYRIDIUM) 95 MG tablet Take 1 tablet (95 mg total) by mouth 2 (two) times daily.  Marland Kitchen PRESCRIPTION MEDICATION Inhale into the lungs at bedtime. CPAP  . rOPINIRole (REQUIP) 2 MG tablet TAKE 1 TABLET BY MOUTH AT BEDTIME  . rosuvastatin (CRESTOR) 10 MG tablet TAKE 1 TAB (10 MG) BY MOUTH EVERY OTHER DAY  . tamsulosin (FLOMAX) 0.4 MG CAPS capsule Take 0.4 mg by mouth daily.  . [DISCONTINUED] Meth-Hyo-M Bl-Na Phos-Ph Sal (URIBEL) 118 MG CAPS Take 1 capsule (118 mg total) by mouth 4 (four) times daily.  . [DISCONTINUED] NOVOLIN N RELION 100 UNIT/ML injection INJECT 30 UNITS SUBCUTANEOUSLY IN THE MORNING AND 20 IN THE EVENING  . [DISCONTINUED] saccharomyces boulardii (FLORASTOR) 250 MG capsule Take 250 mg by mouth daily.     Allergies:   Hydrocodone and Dilaudid [hydromorphone]   Past Medical History:  Diagnosis Date  . Arthritis   . Bladder cancer [facility]) urologist-- dr Louis Meckel   11/ 2019 recurrent   .  Cancer of renal pelvis, right [facility])    oncologist-  dr [name]--  high grade papillary urothelial carcinoma, Stage III (T3 Nx M0) -- 02-12-2017  s/p  left nephrectomy--- completed chemotherapy 07-27-2017  . Chemotherapy-induced neuropathy (HCC)    feet  . CKD (chronic kidney disease) stage 3, GFR 30-59 ml/min   . Coronary artery disease    followed by Dr. [name]; Lexiscan Myoview (10/14):  Low risk, EF 57%, small inf infarct  . Coronary vasospasm ([place]) PER DR Etter Royall NOTE 03-22-2012--  INTER. TIGHTNESS   PER PT PROBABLE FROM STRESS  . Depression    no rx  . Dyslipidemia    pt unable to tolerate niaspan  . GAD (generalized anxiety disorder)   . GERD (gastroesophageal reflux disease)   . Hearing loss    per pt due to chemotherapy  . History of angina CHRONIC -- CONTROLLED W/ IMDUR  . History of cancer chemotherapy     completed 07-27-2017  for renal carcinoma  . 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 History of non-ST elevation myocardial infarction (NSTEMI) 2005  . Hyperlipidemia   . Hypertension   . Nocturia more than twice per night   . OSA on CPAP   . Solitary right kidney    s/p left nephrectomy 02-12-2017  . Type 2 diabetes mellitus treated with insulin [facility])    endocrinologist-- dr Steffanie Dunn ([place] in [place])  lov note in epic   . Urgency of urination   . Wears glasses     Past Surgical History:  Procedure Laterality Date  . APPENDECTOMY  08-03-2004  . CARDIAC CATHETERIZATION  x3  last one 06-17-2007   MILD NON-OBSTRUCTIVE CAD/ NORMAL LVF/ 30% LEFT RENAL ARTERY STENOSIS  . CARDIOVASCULAR STRESS TEST  02/23/2013   Low risk nuclear study w/ small inferior wall infarct at mid and basal level with no ischemia/  normal LV function and wall motion , ef 57%  . CYSTO/ BILATERAL RETROGRADE PYELOGRAM/ RIGHT URETEROSCOPIC LASER FULGURATION URETERAL TUMOR  02-16-2008  . CYSTO/ BLADDER BX  09-13-2008;  08-13-2007; 04-19-2007; 06-01-2006  . CYSTO/ CYSTOGRAM/ URETEROSCOPY  02-21-2009  . CYSTO/ RESECTION BLADDER TUMOR/ LEFT RETROGRADE PYELOGRAM/ RIGHT URETEROSCOPY  08-07-2010   TCC OF BLADDER   S/P DISTAL URETERECTOMY/ REIMPLANTATION  . CYSTO/ RIGHT URETEROSCOPY/ BX URETERAL TUMOR  10-11-2008  . CYSTOSCOPY W/ RETROGRADES  04/19/2012   Procedure: CYSTOSCOPY WITH RETROGRADE PYELOGRAM;  Surgeon: Bernestine Amass, MD;  Location: [facility];  Service: Urology;  Laterality: Bilateral;  30 MINS Cysto, Biopsy, possible TURBT, Bilateral retrograde pyelograms with Mitomycin C instilation Post op   . CYSTOSCOPY W/ RETROGRADES Left 03/25/2018   Procedure: CYSTOSCOPY WITH RETROGRADE PYELOGRAM;  Surgeon: [name], MD;  Location[facility];  Service: Urology;  Laterality: Left;  . CYSTOSCOPY WITH BIOPSY  04/07/2011   Procedure:  CYSTOSCOPY WITH BIOPSY/ RIGHT URETEROSCOPY;  Surgeon: Bernestine Amass, MD;  Location: [facility];  Service: Urology;  Laterality: N/A;    cystoscopy, cystogram, biopsy and fulgeration  . CYSTOSCOPY WITH BIOPSY  12/29/2011   Procedure: CYSTOSCOPY WITH BIOPSY;  Surgeon: Bernestine Amass, MD;  Location[facility];  Service: Urology;  Laterality: N/A;  [place]    . CYSTOSCOPY WITH RETROGRADE PYELOGRAM, URETEROSCOPY AND STENT PLACEMENT Left 04/28/2018   Procedure: LEFT RETROGRADE PYELOGRAM, DIAGNOSTIC LEFT URETEROSCOPY;  Surgeon: [name], MD;  Location: [facility];  Service: Urology;  Laterality: Left;  . ELBOW SURGERY  07-07-2003   RIGHT ELBOW ARTHROSCOPY W/ OPEN RECONSTRUCTION  . IR FLUORO GUIDE PORT INSERTION RIGHT  04/01/2017  . IR US GUIDE VASC ACCESS RIGHT  04/01/2017  . KNEE ARTHROSCOPY W/ DEBRIDEMENT  02-08-2004   RIGHT KNEE  . RHINOPLASTY W/ MODIFIED CARTILAGE GRAFT  05-17-2007   INTERNAL NASAL VALVE COLLAPSE/ OSA/ SEPTAL PERFERATION  . RIGHT DISTAL URETERECTOMY W/ REIMPLANTATION  10-30-2008   TCC OF RIGHT DISTAL URETER  . ROBOT ASSITED LAPAROSCOPIC NEPHROURETERECTOMY Right 02/12/2017   Procedure: XI ROBOT ASSITED LAPAROSCOPIC NEPHROURETERECTOMY;  Surgeon: [name], MD;  Location: [facility];  Service: Urology;  Laterality: Right;  . SHOULDER SURGERY  2005   RIGHT ROTATOR CUFF REPAIR  . TOTAL KNEE ARTHROPLASTY Left 05/18/2015  . TOTAL KNEE ARTHROPLASTY Left 05/18/2015   Procedure: TOTAL KNEE ARTHROPLASTY;  Surgeon: Earlie Server, MD;  Location: [place] [facility];  Service: Orthopedics;  Laterality: Left;  . TOTAL KNEE ARTHROPLASTY Right 03/21/2016   Procedure: TOTAL KNEE ARTHROPLASTY;  Surgeon: Earlie Server, MD;  Location: [place];  Service: Orthopedics;  Laterality: Right;  . TRANSTHORACIC ECHOCARDIOGRAM  02/03/2017   ef 24-58%, grade 1 diastolic dysfunction/  trivial PR  . TRANSURETHRAL RESECTION OF BLADDER TUMOR  01-13-2007  .  TRANSURETHRAL RESECTION OF BLADDER TUMOR  04/19/2012   Procedure: TRANSURETHRAL RESECTION OF BLADDER TUMOR (TURBT);  Surgeon: Bernestine Amass, MD;  Location: [facility];  Service: Urology;  Laterality: N/A;  . TRANSURETHRAL RESECTION OF BLADDER TUMOR N/A 03/25/2018   Procedure: TRANSURETHRAL RESECTION OF BLADDER TUMOR (TURBT);  Surgeon: [name], MD;  Location: [facility];  Service: Urology;  Laterality: N/A;  . TRANSURETHRAL RESECTION OF BLADDER TUMOR N/A 04/28/2018   Procedure: TRANSURETHRAL RESECTION OF BLADDER TUMOR (TURBT);  Surgeon: [name], MD;  Location: [facility];  Service: Urology;  Laterality: N/A;  . umbillical hernia repair  2004     Social History:  The patient  reports that he quit smoking about 12 years ago. His smoking use included cigarettes. He quit after 20.00 years of use. He has never used smokeless tobacco. He reports current alcohol use of about 3.0 - 4.0 standard drinks of alcohol per week. He reports that he does not use drugs.   Family History:  The patient's family history includes Coronary artery disease in his father; Diabetes in his maternal grandmother; Heart disease in his paternal grandfather.    ROS:  Please see the history of present illness. All other systems are reviewed and  Negative to the above problem except as noted.    PHYSICAL EXAM: VS:  BP 120/80   Pulse 68   Ht 5\' 9"  (1.753 m)   Wt 217 lb 3.2 oz (98.5 kg)   BMI 32.07 kg/m   GEN: Obese 64 yo , in no acute distress  HEENT: normal  Neck: no JVD, no carotid bruits Cardiac: RRR; no murmurs, rubs, or gallops, Triv edema  Respiratory:  clear to auscultation bilaterally,  GI: soft, nontender, nondistended, + BS  No hepatomegaly  MS: no deformity Moving all extremities   Skin: warm and dry, no rash Neuro:  Strength and sensation are intact Psych: euthymic mood, full affect   EKG:  EKG is ordered today.  SR with PACs  68 bpm     Lipid  Panel    Component Value Date/Time   CHOL 123 03/07/2019 1038   TRIG 208 (H) 03/07/2019 1038   HDL 30 (L) 03/07/2019 1038  CHOLHDL 4.1 03/07/2019 1038   VLDL 34 (H) 10/10/2016 1043   LDLCALC 66 03/07/2019 1038   LDLDIRECT 96.0 07/20/2015 1035      Wt Readings from Last 3 Encounters:  09/22/19 217 lb 3.2 oz (98.5 kg)  08/19/19 211 lb (95.7 kg)  08/19/19 215 lb (97.5 kg)      ASSESSMENT AND PLAN: 1  CAD   Pt remains asymptomatic     2  Edema   Trivial   Watch salt.  May be related some to poss neuropathy     Gae him contact for elatstic therapy   3  HL   LDL under great control at 66   Watch carbs    4  HTN   BP is OK    F/U in Jan     Current medicines are reviewed at length with the patient today.  The patient does not have concerns regarding medicines.  Signed, [name], MD  09/22/2019 2:33 PM    [address], [place], [place]  [address] Phone: [phone]; Fax: [phone]

## 2019-09-22 NOTE — Patient Instructions (Signed)
Medication Instructions:  Your physician recommends that you continue on your current medications as directed. Please refer to the Current Medication list given to you today.  *If you need a refill on your cardiac medications before your next appointment, please call your pharmacy*   Lab Work: NONE If you have labs (blood work) drawn today and your tests are completely normal, you will receive your results only by: Marland Kitchen MyChart Message (if you have MyChart) OR . A paper copy in the mail If you have any lab test that is abnormal or we need to change your treatment, we will call you to review the results.   Testing/Procedures: NONE   Follow-Up: At [facility], you and your health needs are our priority.  As part of our continuing mission to provide you with exceptional heart care, we have created designated Provider Care Teams.  These Care Teams include your primary Cardiologist (physician) and Advanced Practice Providers (APPs -  Physician Assistants and Nurse Practitioners) who all work together to provide you with the care you need, when you need it.  We recommend signing up for the patient portal called "MyChart".  Sign up information is provided on this After Visit Summary.  MyChart is used to connect with patients for Virtual Visits (Telemedicine).  Patients are able to view lab/test results, encounter notes, upcoming appointments, etc.  Non-urgent messages can be sent to your provider as well.   To learn more about what you can do with MyChart, go to [email or web address].    Your next appointment:   8 month(s)  The format for your next appointment:   In Person  Provider:   [name], MD

## 2019-09-28 DIAGNOSIS — C678 Malignant neoplasm of overlapping sites of bladder: Secondary | ICD-10-CM | POA: Diagnosis not present

## 2019-09-28 DIAGNOSIS — Z5111 Encounter for antineoplastic chemotherapy: Secondary | ICD-10-CM | POA: Diagnosis not present

## 2019-10-05 DIAGNOSIS — N3000 Acute cystitis without hematuria: Secondary | ICD-10-CM | POA: Diagnosis not present

## 2019-10-05 DIAGNOSIS — R310 Gross hematuria: Secondary | ICD-10-CM | POA: Diagnosis not present

## 2019-10-11 DIAGNOSIS — Z794 Long term (current) use of insulin: Secondary | ICD-10-CM | POA: Diagnosis not present

## 2019-10-11 DIAGNOSIS — E1165 Type 2 diabetes mellitus with hyperglycemia: Secondary | ICD-10-CM | POA: Diagnosis not present

## 2019-10-11 DIAGNOSIS — E1142 Type 2 diabetes mellitus with diabetic polyneuropathy: Secondary | ICD-10-CM | POA: Diagnosis not present

## 2019-10-12 DIAGNOSIS — C678 Malignant neoplasm of overlapping sites of bladder: Secondary | ICD-10-CM | POA: Diagnosis not present

## 2019-10-12 DIAGNOSIS — R310 Gross hematuria: Secondary | ICD-10-CM | POA: Diagnosis not present

## 2019-10-13 MED FILL — TADALAFIL 5 MG TABS: 5 | 30 days supply | Qty: 30 | Fill #1

## 2019-10-13 MED FILL — TAMSULOSIN HCL 0.4 MG CAP: 0.4 | 30 days supply | Qty: 60 | Fill #4

## 2019-10-17 DIAGNOSIS — G620 Drug-induced polyneuropathy: Secondary | ICD-10-CM | POA: Diagnosis not present

## 2019-10-17 DIAGNOSIS — M21621 Bunionette of right foot: Secondary | ICD-10-CM | POA: Diagnosis not present

## 2019-10-17 DIAGNOSIS — B351 Tinea unguium: Secondary | ICD-10-CM | POA: Diagnosis not present

## 2019-10-17 DIAGNOSIS — E1142 Type 2 diabetes mellitus with diabetic polyneuropathy: Secondary | ICD-10-CM | POA: Diagnosis not present

## 2019-10-19 ENCOUNTER — Inpatient Hospital Stay: Payer: Medicare Other | Attending: Hematology & Oncology

## 2019-10-19 ENCOUNTER — Other Ambulatory Visit: Payer: Self-pay

## 2019-10-19 VITALS — BP 131/75 | HR 80 | Temp 97.1°F | Resp 18

## 2019-10-19 DIAGNOSIS — Z452 Encounter for adjustment and management of vascular access device: Secondary | ICD-10-CM | POA: Diagnosis not present

## 2019-10-19 DIAGNOSIS — Z8551 Personal history of malignant neoplasm of bladder: Secondary | ICD-10-CM | POA: Diagnosis not present

## 2019-10-19 DIAGNOSIS — D508 Other iron deficiency anemias: Secondary | ICD-10-CM

## 2019-10-19 MED ORDER — SODIUM CHLORIDE 0.9% FLUSH
10.0000 mL | Freq: Once | INTRAVENOUS | Status: AC | PRN
  Administered 2019-10-19: 10 mL
  Filled 2019-10-19: qty 10

## 2019-10-19 MED ORDER — HEPARIN SOD (PORK) LOCK FLUSH 100 UNIT/ML IV SOLN
500.0000 [IU] | Freq: Once | INTRAVENOUS | Status: AC | PRN
  Administered 2019-10-19: 500 [IU]
  Filled 2019-10-19: qty 5

## 2019-10-19 NOTE — Patient Instructions (Signed)

## 2019-10-24 DIAGNOSIS — C678 Malignant neoplasm of overlapping sites of bladder: Secondary | ICD-10-CM | POA: Diagnosis not present

## 2019-10-24 DIAGNOSIS — R310 Gross hematuria: Secondary | ICD-10-CM | POA: Diagnosis not present

## 2019-11-01 MED FILL — DIAZEPAM 10 MG TABS: 10 | 30 days supply | Qty: 30 | Fill #1

## 2019-11-08 ENCOUNTER — Other Ambulatory Visit: Payer: Self-pay | Admitting: Internal Medicine

## 2019-11-08 ENCOUNTER — Telehealth: Payer: Self-pay | Admitting: Internal Medicine

## 2019-11-08 MED ORDER — NITROGLYCERIN 0.4 MG SL SUBL: 0.4000 mg | SUBLINGUAL_TABLET | SUBLINGUAL | 6 refills | Status: DC | PRN

## 2019-11-08 MED ORDER — ISOSORBIDE MONONITRATE ER 120 MG PO TB24: ORAL_TABLET | ORAL | 3 refills | Status: DC

## 2019-11-08 MED ORDER — METOPROLOL SUCCINATE ER 25 MG PO TB24: ORAL_TABLET | ORAL | 3 refills | Status: DC

## 2019-11-08 MED ORDER — ROSUVASTATIN CALCIUM 10 MG PO TABS: ORAL_TABLET | ORAL | 2 refills | Status: DC

## 2019-11-08 MED ORDER — PANTOPRAZOLE SODIUM 40 MG PO TBEC: 40.0000 mg | DELAYED_RELEASE_TABLET | Freq: Every morning | ORAL | 3 refills | Status: DC

## 2019-11-08 NOTE — Telephone Encounter (Signed)
Pt's medications were sent to pt's pharmacy as requested. Confirmation received.  

## 2019-11-08 NOTE — Telephone Encounter (Signed)
*  STAT* If patient is at the pharmacy, call can be transferred to refill team.   1. Which medications need to be refilled? (please list name of each medication and dose if known)  pantoprazole (PROTONIX) 40 MG tablet rosuvastatin (CRESTOR) 10 MG tablet isosorbide mononitrate (IMDUR) 120 MG 24 hr tablet metoprolol succinate (TOPROL-XL) 25 MG 24 hr tablet nitroGLYCERIN (NITROSTAT) 0.4 MG SL tablet  2. Which pharmacy/location (including street and city if local pharmacy) is medication to be sent to?  [facility] [place] [place]  3. Do they need a 30 day or 90 day supply? 90 with refills

## 2019-11-09 MED ORDER — NITROGLYCERIN 0.4 MG SL SUBL: 0.4000 mg | SUBLINGUAL_TABLET | SUBLINGUAL | 6 refills | Status: DC | PRN

## 2019-11-09 NOTE — Addendum Note (Signed)
Addended by: Carter Kitten D on: 11/09/2019 12:18 PM   Modules accepted: Orders

## 2019-11-10 ENCOUNTER — Telehealth: Payer: Self-pay | Admitting: Internal Medicine

## 2019-11-10 NOTE — Telephone Encounter (Signed)
Faxed completed form with 33 pages for Diabetic shoes to [facility] fax number [phone]

## 2019-11-11 ENCOUNTER — Telehealth: Payer: Self-pay

## 2019-11-11 NOTE — Telephone Encounter (Signed)
Patient has been prescribed tadalafil by another provider. Patient is on SL NTG. Pharmacy calling and wanting to know if Dr. Harrington Challenger is okay with this. Will forward to Dr. Harrington Challenger to see if she is okay with patient taking both as long as there is 48 hours in between use of each other.

## 2019-11-11 NOTE — Telephone Encounter (Signed)
[facility] pharmacy requesting clarification on pt's medication nitroglycerin. Pharmacy states that pt is refill medication tadalafil from another provider at another pharmacy. Tadalafil and nitrates are contraindicated. Please advise if Dr. Harrington Challenger would like pt to still refill nitroglycerin.  Pharmacy ph # [phone].

## 2019-11-14 NOTE — Telephone Encounter (Signed)
Pt is on Imdur 120 mg   He needs to come off of this if he wants to take tadalifil Could try 2.5 mg amlodipine in its place   Acts by diffenet mechanism to vasodilate

## 2019-11-15 NOTE — Telephone Encounter (Signed)
Called and spoke to the patient. Made him aware that he cannot take both tadalafil and imdur. Made him aware that we can try and switch his imdur to amlodipine if he would like to take the tadalafil. Patient states that he does not want to switch the imdur at this time. Instructed the patient to not take tadalafil. He states that he already took some without any problem. Patient educated and instructed not to take while on imdur. He states that the tadalafil was prescribed by Dr. Louis Meckel, his urologist and he picked it up at [facility].   I have called [facility] and let them know that the patient has been instructed not tot ake tadalafil while on imdur and that they will need to hold future refills while on imdur.  Called [facility] and let them know that they may fill the patient's SL NTG.

## 2019-12-19 ENCOUNTER — Inpatient Hospital Stay: Payer: Medicare Other

## 2019-12-19 ENCOUNTER — Inpatient Hospital Stay (HOSPITAL_BASED_OUTPATIENT_CLINIC_OR_DEPARTMENT_OTHER): Payer: Medicare Other | Admitting: Hematology & Oncology

## 2019-12-19 ENCOUNTER — Other Ambulatory Visit: Payer: Self-pay | Admitting: Internal Medicine

## 2019-12-19 ENCOUNTER — Inpatient Hospital Stay: Payer: Medicare Other | Attending: Hematology & Oncology

## 2019-12-19 ENCOUNTER — Other Ambulatory Visit: Payer: Self-pay

## 2019-12-19 VITALS — BP 134/77 | HR 72 | Temp 98.2°F | Resp 17 | Wt 218.0 lb

## 2019-12-19 DIAGNOSIS — F419 Anxiety disorder, unspecified: Secondary | ICD-10-CM | POA: Diagnosis not present

## 2019-12-19 DIAGNOSIS — Z905 Acquired absence of kidney: Secondary | ICD-10-CM | POA: Insufficient documentation

## 2019-12-19 DIAGNOSIS — R112 Nausea with vomiting, unspecified: Secondary | ICD-10-CM | POA: Diagnosis not present

## 2019-12-19 DIAGNOSIS — Z452 Encounter for adjustment and management of vascular access device: Secondary | ICD-10-CM | POA: Insufficient documentation

## 2019-12-19 DIAGNOSIS — C670 Malignant neoplasm of trigone of bladder: Secondary | ICD-10-CM

## 2019-12-19 DIAGNOSIS — Z8551 Personal history of malignant neoplasm of bladder: Secondary | ICD-10-CM | POA: Diagnosis not present

## 2019-12-19 DIAGNOSIS — C651 Malignant neoplasm of right renal pelvis: Secondary | ICD-10-CM

## 2019-12-19 DIAGNOSIS — Z95828 Presence of other vascular implants and grafts: Secondary | ICD-10-CM

## 2019-12-19 LAB — CMP (CANCER CENTER ONLY)
ALT: 31 U/L (ref 0–44)
AST: 20 U/L (ref 15–41)
Albumin: 4.1 g/dL (ref 3.5–5.0)
Alkaline Phosphatase: 43 U/L (ref 38–126)
Anion gap: 6 (ref 5–15)
BUN: 21 mg/dL (ref 8–23)
CO2: 28 mmol/L (ref 22–32)
Calcium: 9.3 mg/dL (ref 8.9–10.3)
Chloride: 104 mmol/L (ref 98–111)
Creatinine: 1.83 mg/dL — ABNORMAL HIGH (ref 0.61–1.24)
GFR, Est AFR Am: 44 mL/min — ABNORMAL LOW (ref >60)
GFR, Estimated: 38 mL/min — ABNORMAL LOW (ref >60)
Glucose, Bld: 159 mg/dL — ABNORMAL HIGH (ref 70–99)
Potassium: 4.2 mmol/L (ref 3.5–5.1)
Sodium: 138 mmol/L (ref 135–145)
Total Bilirubin: 0.6 mg/dL (ref 0.3–1.2)
Total Protein: 6.2 g/dL — ABNORMAL LOW (ref 6.5–8.1)

## 2019-12-19 LAB — CBC WITH DIFFERENTIAL (CANCER CENTER ONLY)
Abs Immature Granulocytes: 0.01 10*3/uL (ref 0.00–0.07)
Basophils Absolute: 0 10*3/uL (ref 0.0–0.1)
Basophils Relative: 1 %
Eosinophils Absolute: 0.1 10*3/uL (ref 0.0–0.5)
Eosinophils Relative: 3 %
HCT: 41.1 % (ref 39.0–52.0)
Hemoglobin: 13.9 g/dL (ref 13.0–17.0)
Immature Granulocytes: 0 %
Lymphocytes Relative: 31 %
Lymphs Abs: 1.1 10*3/uL (ref 0.7–4.0)
MCH: 31.7 pg (ref 26.0–34.0)
MCHC: 33.8 g/dL (ref 30.0–36.0)
MCV: 93.6 fL (ref 80.0–100.0)
Monocytes Absolute: 0.4 10*3/uL (ref 0.1–1.0)
Monocytes Relative: 11 %
Neutro Abs: 1.9 10*3/uL (ref 1.7–7.7)
Neutrophils Relative %: 54 %
Platelet Count: 97 10*3/uL — ABNORMAL LOW (ref 150–400)
RBC: 4.39 MIL/uL (ref 4.22–5.81)
RDW: 13.2 % (ref 11.5–15.5)
WBC Count: 3.6 10*3/uL — ABNORMAL LOW (ref 4.0–10.5)
nRBC: 0 % (ref 0.0–0.2)

## 2019-12-19 LAB — LACTATE DEHYDROGENASE: LDH: 156 U/L (ref 98–192)

## 2019-12-19 MED ORDER — HEPARIN SOD (PORK) LOCK FLUSH 100 UNIT/ML IV SOLN
500.0000 [IU] | Freq: Once | INTRAVENOUS | Status: AC
  Administered 2019-12-19: 500 [IU] via INTRAVENOUS
  Filled 2019-12-19: qty 5

## 2019-12-19 MED ORDER — SODIUM CHLORIDE 0.9% FLUSH
10.0000 mL | Freq: Once | INTRAVENOUS | Status: AC
  Administered 2019-12-19: 10 mL via INTRAVENOUS
  Filled 2019-12-19: qty 10

## 2019-12-19 MED ORDER — ALPRAZOLAM 1 MG PO TABS: 1.0000 mg | ORAL_TABLET | Freq: Three times a day (TID) | ORAL | 0 refills | Status: DC | PRN

## 2019-12-19 MED FILL — ALPRAZolam 1 MG TABS: 1 | 30 days supply | Qty: 90 | Fill #0

## 2019-12-19 MED FILL — DIAZEPAM 10 MG TABS: 10 | 30 days supply | Qty: 30 | Fill #2

## 2019-12-19 NOTE — Patient Instructions (Signed)

## 2019-12-19 NOTE — Progress Notes (Signed)
Hematology and Oncology Follow Up Visit  Isaac [name] [id number] [birth year] 64 y.o. 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 is here today for follow-up.  Overall, he seems to be doing pretty well.  His blood sugars are still on the high side.  Hopefully, he'll be able to get these under better control.  His main problem has been urinary frequency.  This seems all have happened since he had his intravesicular BCG.  He does see urology for this.  His blood sugars have been on the high side.  They might be a little bit better controlled.  He has had no problems with nausea or vomiting.  He has had no bleeding.  He has had no problems with diarrhea.  He does have little bit of leg swelling.  This might be from medications.  His blood sugars, he says, have been doing a little bit better.  There is been no issues with fever.  He has had no problems with the coronavirus.  Overall, his performance status is ECOG 1.   Medications:  Allergies as of 12/19/2019      Reactions   Hydrocodone Itching, Nausea And Vomiting   Dilaudid [hydromorphone] Nausea And Vomiting      Medication List       Accurate as of December 19, 2019 10:19 AM. If you have any questions, ask your nurse or doctor.        ALPRAZolam 1 MG tablet Commonly known as: XANAX Take 1 tablet (1 mg total) by mouth 3 (three) times daily as needed for anxiety.   aspirin EC 81 MG tablet Take 81 mg by mouth at bedtime.   diazepam 10 MG tablet Commonly known as: VALIUM Take 10 mg by mouth See admin instructions. Take one tablet (10 mg) by mouth one hour prior to bladder procedure or BCG   finasteride 1 MG tablet Commonly known as: PROPECIA Take 1 mg by mouth daily.     glimepiride 2 MG tablet Commonly known as: AMARYL TAKE 1 BY MOUTH DAILY BEFORE SUPPER What changed:   how much to take  how to take this  when to take this  additional instructions   isosorbide mononitrate 120 MG 24 hr tablet Commonly known as: IMDUR TAKE 1 TABLET BY MOUTH DAILY. GENERIC EQUIVALENT FOR IMDUR   ketoconazole 2 % cream Commonly known as: NIZORAL Apply 1 application topically daily.   metoprolol succinate 25 MG 24 hr tablet Commonly known as: TOPROL-XL TAKE 1 TABLET BY MOUTH AT BEDTIME. GENERIC EQUIVALENT FOR TOPROL-XL   nitroGLYCERIN 0.4 MG SL tablet Commonly known as: NITROSTAT Place 1 tablet (0.4 mg total) under the tongue every 5 (five) minutes as needed for chest pain. Repeat every 5 minute up to 3 doses if no relief call 911.   pantoprazole 40 MG tablet Commonly known as: PROTONIX Take 1 tablet (40 mg total) by mouth every morning.   phenazopyridine 95 MG tablet Commonly known as: PYRIDIUM Take 1 tablet (95 mg total) by mouth 2 (two) times daily.   PRESCRIPTION MEDICATION Inhale into the lungs at bedtime. CPAP   rOPINIRole 2 MG tablet Commonly known as: REQUIP TAKE 1 TABLET BY MOUTH AT BEDTIME   rosuvastatin 10 MG tablet Commonly known as: CRESTOR TAKE 1 TAB (10 MG) BY MOUTH EVERY OTHER DAY  tamsulosin 0.4 MG Caps capsule Commonly known as: FLOMAX Take 0.4 mg by mouth daily.   Toujeo SoloStar 300 UNIT/ML Solostar Pen Generic drug: insulin glargine (1 Unit Dial) Inject into the skin. 04/27/2019 30U in am and 40U pm.   Victoza 18 MG/3ML Sopn Generic drug: liraglutide Inject into the skin daily.       Allergies:  Allergies  Allergen Reactions  . Hydrocodone Itching and Nausea And Vomiting  . Dilaudid [Hydromorphone] Nausea And Vomiting    Past Medical History, Surgical history, Social history, and Family History were reviewed and updated.  Review of Systems: Review of Systems  Constitutional: Negative.   HENT: Positive for  tinnitus.   Eyes: Negative.   Respiratory: Negative.   Cardiovascular: Negative.   Gastrointestinal: Positive for nausea.  Genitourinary: Negative.   Musculoskeletal: Negative.   Skin: Negative.   Neurological: Negative.   Endo/Heme/Allergies: Negative.   Psychiatric/Behavioral: Negative.      Physical Exam:  weight is 218 lb (98.9 kg). His oral temperature is 98.2 F (36.8 C). His blood pressure is 134/77 and his pulse is 72. His respiration is 17 and oxygen saturation is 98%.   Wt Readings from Last 3 Encounters:  12/19/19 218 lb (98.9 kg)  09/22/19 217 lb 3.2 oz (98.5 kg)  08/19/19 211 lb (95.7 kg)    Physical Exam Vitals reviewed.  HENT:     Head: Normocephalic and atraumatic.  Eyes:     Pupils: Pupils are equal, round, and reactive to light.  Cardiovascular:     Rate and Rhythm: Normal rate and regular rhythm.     Heart sounds: Normal heart sounds.  Pulmonary:     Effort: Pulmonary effort is normal.     Breath sounds: Normal breath sounds.  Abdominal:     General: Bowel sounds are normal.     Palpations: Abdomen is soft.  Musculoskeletal:        General: No tenderness or deformity. Normal range of motion.     Cervical back: Normal range of motion.  Lymphadenopathy:     Cervical: No cervical adenopathy.  Skin:    General: Skin is warm and dry.     Findings: No erythema or rash.  Neurological:     Mental Status: He is alert and oriented to person, place, and time.  Psychiatric:        Behavior: Behavior normal.        Thought Content: Thought content normal.        Judgment: Judgment normal.     Lab Results  Component Value Date   WBC 3.6 (L) 12/19/2019   HGB 13.9 12/19/2019   HCT 41.1 12/19/2019   MCV 93.6 12/19/2019   PLT 97 (L) 12/19/2019   Lab Results  Component Value Date   FERRITIN 52 07/06/2018   IRON 98 07/06/2018   TIBC 371 07/06/2018   UIBC 273 07/06/2018   IRONPCTSAT 27 07/06/2018   Lab Results  Component Value Date   RETICCTPCT  2.2 (H) 10/15/2017   RBC 4.39 12/19/2019   No results found for: KPAFRELGTCHN, LAMBDASER, KAPLAMBRATIO Lab Results  Component Value Date   IGA 232 01/25/2015   No results found for: Ronnald Ramp, A1GS, A2GS, Violet Baldy, MSPIKE, SPEI   Chemistry      Component Value Date/Time   NA 135 08/19/2019 1029   NA 134 05/04/2017 0925   K 4.3 08/19/2019 1029   K 4.5 05/04/2017 0925   CL 103 08/19/2019 1029   CL 97 (  L) 05/04/2017 0925   CO2 24 08/19/2019 1029   CO2 23 05/04/2017 0925   BUN 28 (H) 08/19/2019 1029   BUN 28 (H) 05/04/2017 0925   CREATININE 1.79 (H) 08/19/2019 1029   CREATININE 1.95 (H) 03/07/2019 1038   GLU 86 03/31/2016 0000      Component Value Date/Time   CALCIUM 8.7 (L) 08/19/2019 1029   CALCIUM 9.3 05/04/2017 0925   ALKPHOS 46 08/19/2019 1029   ALKPHOS 58 05/04/2017 0925   AST 33 08/19/2019 1029   ALT 44 08/19/2019 1029   ALT 90 (H) 05/04/2017 0925   BILITOT 0.6 08/19/2019 1029      Impression and Plan: [name] is a very pleasant 64 yo caucasian gentleman with infiltrating high grade papillary urothelial carcinoma with right nephrectomy in October 2018.   It has now been 2 years since he completed his adjuvant chemotherapy.  I think we can probably move his scans out now to every 6 months.  We will see him back in 2 months.   I am not sure why platelet count is on the low side.  Again, I looked at his blood smear I do not see that looked suspicious.  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  I would not think this is anything related to bone marrow involvement.   At this point, we are in a surveillance mode.  Cassell Smiles, MD 8/9/202110:19 AM

## 2019-12-20 DIAGNOSIS — R202 Paresthesia of skin: Secondary | ICD-10-CM | POA: Diagnosis not present

## 2019-12-20 MED FILL — TAMSULOSIN HCL 0.4 MG CAP: 0.4 | 30 days supply | Qty: 60 | Fill #5

## 2019-12-22 DIAGNOSIS — L814 Other melanin hyperpigmentation: Secondary | ICD-10-CM | POA: Diagnosis not present

## 2019-12-22 DIAGNOSIS — Z85828 Personal history of other malignant neoplasm of skin: Secondary | ICD-10-CM | POA: Diagnosis not present

## 2019-12-22 DIAGNOSIS — L570 Actinic keratosis: Secondary | ICD-10-CM | POA: Diagnosis not present

## 2019-12-22 DIAGNOSIS — L918 Other hypertrophic disorders of the skin: Secondary | ICD-10-CM | POA: Diagnosis not present

## 2019-12-22 NOTE — Telephone Encounter (Signed)
Faxed all the information again [phone]

## 2020-01-10 MED FILL — FINASTERIDE 5 MG TABLET: 5 | 90 days supply | Qty: 90 | Fill #2

## 2020-01-11 DIAGNOSIS — E119 Type 2 diabetes mellitus without complications: Secondary | ICD-10-CM | POA: Diagnosis not present

## 2020-01-11 DIAGNOSIS — Z794 Long term (current) use of insulin: Secondary | ICD-10-CM | POA: Diagnosis not present

## 2020-01-12 MED FILL — TAMSULOSIN HCL 0.4 MG CAP: 0.4 | 30 days supply | Qty: 60 | Fill #6

## 2020-01-17 DIAGNOSIS — Z794 Long term (current) use of insulin: Secondary | ICD-10-CM | POA: Diagnosis not present

## 2020-01-17 DIAGNOSIS — E119 Type 2 diabetes mellitus without complications: Secondary | ICD-10-CM | POA: Diagnosis not present

## 2020-01-18 MED FILL — AMOXICILLIN 500 MG CAPSULE: 500 | 3 days supply | Qty: 12 | Fill #1

## 2020-01-19 DIAGNOSIS — Z0120 Encounter for dental examination and cleaning without abnormal findings: Secondary | ICD-10-CM | POA: Diagnosis not present

## 2020-01-23 DIAGNOSIS — M21621 Bunionette of right foot: Secondary | ICD-10-CM | POA: Diagnosis not present

## 2020-01-23 DIAGNOSIS — E1142 Type 2 diabetes mellitus with diabetic polyneuropathy: Secondary | ICD-10-CM | POA: Diagnosis not present

## 2020-01-23 DIAGNOSIS — G620 Drug-induced polyneuropathy: Secondary | ICD-10-CM | POA: Diagnosis not present

## 2020-01-23 DIAGNOSIS — B351 Tinea unguium: Secondary | ICD-10-CM | POA: Diagnosis not present

## 2020-01-24 ENCOUNTER — Other Ambulatory Visit: Payer: Self-pay | Admitting: Internal Medicine

## 2020-01-24 DIAGNOSIS — G2581 Restless legs syndrome: Secondary | ICD-10-CM

## 2020-01-26 DIAGNOSIS — E1142 Type 2 diabetes mellitus with diabetic polyneuropathy: Secondary | ICD-10-CM | POA: Diagnosis not present

## 2020-01-26 DIAGNOSIS — M21621 Bunionette of right foot: Secondary | ICD-10-CM | POA: Diagnosis not present

## 2020-01-26 DIAGNOSIS — M21622 Bunionette of left foot: Secondary | ICD-10-CM | POA: Diagnosis not present

## 2020-02-06 DIAGNOSIS — R262 Difficulty in walking, not elsewhere classified: Secondary | ICD-10-CM | POA: Diagnosis not present

## 2020-02-06 DIAGNOSIS — E1140 Type 2 diabetes mellitus with diabetic neuropathy, unspecified: Secondary | ICD-10-CM | POA: Diagnosis not present

## 2020-02-06 DIAGNOSIS — R531 Weakness: Secondary | ICD-10-CM | POA: Diagnosis not present

## 2020-02-06 MED FILL — FLUARIX QUADRIVALENT 0.5 ML: 0.5 | 1 days supply | Qty: 1 | Fill #0

## 2020-02-08 DIAGNOSIS — R262 Difficulty in walking, not elsewhere classified: Secondary | ICD-10-CM | POA: Diagnosis not present

## 2020-02-08 DIAGNOSIS — E1140 Type 2 diabetes mellitus with diabetic neuropathy, unspecified: Secondary | ICD-10-CM | POA: Diagnosis not present

## 2020-02-08 DIAGNOSIS — R531 Weakness: Secondary | ICD-10-CM | POA: Diagnosis not present

## 2020-02-13 ENCOUNTER — Other Ambulatory Visit (HOSPITAL_BASED_OUTPATIENT_CLINIC_OR_DEPARTMENT_OTHER): Payer: Self-pay | Admitting: Urology

## 2020-02-13 DIAGNOSIS — I700 Atherosclerosis of aorta: Secondary | ICD-10-CM | POA: Diagnosis not present

## 2020-02-13 DIAGNOSIS — N3000 Acute cystitis without hematuria: Secondary | ICD-10-CM | POA: Diagnosis not present

## 2020-02-13 DIAGNOSIS — C678 Malignant neoplasm of overlapping sites of bladder: Secondary | ICD-10-CM | POA: Diagnosis not present

## 2020-02-13 DIAGNOSIS — K5732 Diverticulitis of large intestine without perforation or abscess without bleeding: Secondary | ICD-10-CM | POA: Diagnosis not present

## 2020-02-13 DIAGNOSIS — R300 Dysuria: Secondary | ICD-10-CM | POA: Diagnosis not present

## 2020-02-13 DIAGNOSIS — R310 Gross hematuria: Secondary | ICD-10-CM | POA: Diagnosis not present

## 2020-02-13 DIAGNOSIS — K4020 Bilateral inguinal hernia, without obstruction or gangrene, not specified as recurrent: Secondary | ICD-10-CM | POA: Diagnosis not present

## 2020-02-13 MED FILL — CIPROFLOXACIN HCL 250 MG TA: 250 | 7 days supply | Qty: 14 | Fill #0

## 2020-02-13 MED FILL — METRONIDAZOLE 500 MG TABS: 500 | 5 days supply | Qty: 20 | Fill #0

## 2020-02-16 ENCOUNTER — Inpatient Hospital Stay: Payer: Medicare Other

## 2020-02-16 ENCOUNTER — Inpatient Hospital Stay: Payer: Medicare Other | Admitting: Hematology & Oncology

## 2020-02-20 ENCOUNTER — Other Ambulatory Visit (HOSPITAL_BASED_OUTPATIENT_CLINIC_OR_DEPARTMENT_OTHER): Payer: Self-pay | Admitting: Adult Health

## 2020-02-20 DIAGNOSIS — K5780 Diverticulitis of intestine, part unspecified, with perforation and abscess without bleeding: Secondary | ICD-10-CM | POA: Diagnosis not present

## 2020-02-20 DIAGNOSIS — R8279 Other abnormal findings on microbiological examination of urine: Secondary | ICD-10-CM | POA: Diagnosis not present

## 2020-02-20 DIAGNOSIS — C678 Malignant neoplasm of overlapping sites of bladder: Secondary | ICD-10-CM | POA: Diagnosis not present

## 2020-02-20 MED FILL — CIPROFLOXACIN HCL 250 MG TA: 250 | 7 days supply | Qty: 14 | Fill #0

## 2020-02-20 MED FILL — ONDANSETRON HCL 4 MG TABLET: 4 | 4 days supply | Qty: 14 | Fill #0

## 2020-02-21 ENCOUNTER — Other Ambulatory Visit (HOSPITAL_BASED_OUTPATIENT_CLINIC_OR_DEPARTMENT_OTHER): Payer: Self-pay | Admitting: Surgery

## 2020-02-21 ENCOUNTER — Ambulatory Visit: Payer: Self-pay | Admitting: Surgery

## 2020-02-21 DIAGNOSIS — Z86010 Personal history of colonic polyps: Secondary | ICD-10-CM | POA: Diagnosis not present

## 2020-02-21 DIAGNOSIS — K4020 Bilateral inguinal hernia, without obstruction or gangrene, not specified as recurrent: Secondary | ICD-10-CM | POA: Diagnosis not present

## 2020-02-21 DIAGNOSIS — R350 Frequency of micturition: Secondary | ICD-10-CM | POA: Diagnosis not present

## 2020-02-21 DIAGNOSIS — C679 Malignant neoplasm of bladder, unspecified: Secondary | ICD-10-CM | POA: Diagnosis not present

## 2020-02-21 DIAGNOSIS — N321 Vesicointestinal fistula: Secondary | ICD-10-CM | POA: Diagnosis not present

## 2020-02-21 MED FILL — TAMSULOSIN HCL 0.4 MG CAP: 0.4 | 30 days supply | Qty: 60 | Fill #7

## 2020-02-21 MED FILL — NEOMYCIN 500 MG TABLET: 500 | 1 days supply | Qty: 6 | Fill #0

## 2020-02-21 MED FILL — METRONIDAZOLE 500 MG TABS: 500 | 3 days supply | Qty: 6 | Fill #0

## 2020-02-21 NOTE — H&P (Signed)
Isaac [name] Appointment: 02/21/2020 10:15 AM Location: [facility] Patient #: [id number] DOB: 1956-03-04 Unknown / Language: Isaac [name] / Race: White Male  History of Present Illness Isaac Hector MD; 02/21/2020 1:48 PM) The patient is a 64 year old male who presents with colovesical fistula. Note for "Colovesical fistula": ` ` ` Patient sent for surgical consultation at the request of Burman Nieves, [facility]  Chief Complaint: Pneumaturia and bile in urine. Probable colovesical fistula. ` ` The patient is a patient with a very complicated urological history. 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 Required right distal ureteral resection in 2010. He's had bladder tumors treated cystoscopically and with bladder treatments. 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 2019 Dr. Louis Meckel. Since then, no strong evidence of any bladder tumor recurrence. However he had an episode of lower abdominal pain about a month ago. Rather severe. More right-sided. CAT scan showed inflammation. Involving the bladder as well. Patient has had episodes of brown urine. Occasional pneumaturia. Occasional flecks. Followed up with urology. CAT scan was done. 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 Surgical consultation recommended.  Patient denies any history of prior diverticulitis that he is aware of. He's had colonoscopies for polyps in the past. Followed by lumbar gastrology. Dr. [name] excised 2 adenomatous polyps in 2019. Three-year follow-up recommended which would mean 2012. Patient usually has been constipated since his problems of the past month he is moving his bowels at least once a day, sometimes more often. Normally he can walk a half hour without much difficulty. He is an insulin-requiring diabetic. A1c running around 8. No cardiac or pulmonary issues. No obvious sleep apnea. No dialysis. Hypertension mostly  controlled.  No personal nor family history of GI/colon cancer, inflammatory bowel disease, irritable bowel syndrome, allergy such as Celiac Sprue, dietary/dairy problems, colitis, ulcers nor gastritis. No recent sick contacts/gastroenteritis. No travel outside the country. No changes in diet. No dysphagia to solids or liquids. No significant heartburn or reflux. No melena, hematemesis, coffee ground emesis. No evidence of prior gastric/peptic ulceration.  (Review of systems as stated in this history (HPI) or in the review of systems. Otherwise all other 12 point ROS are negative) ` ` ###########################################`  This patient encounter took 65 minutes today to perform the following: obtain history, perform exam, review outside records, interpret tests & imaging, counsel the patient on their diagnosis; and, document this encounter, including findings & plan in the electronic health record (EHR).   Past Surgical History Darden Palmer, [place]; 02/21/2020 10:39 AM) Colon Polyp Removal - Colonoscopy Nephrectomy Right. Shoulder Surgery Right.  Diagnostic Studies History Darden Palmer, [place]; 02/21/2020 10:39 AM) Colonoscopy 1-5 years ago  Allergies Darden Palmer, RMA; 02/21/2020 10:40 AM) HYDROcodone Bitartrate *CHEMICALS* Dilaudid *ANALGESICS - OPIOID*  Medication History (Alisha Spillers, CMA; 02/21/2020 10:44 AM) ALPRAZolam (1MG  Tablet, Oral) Active. diazePAM (10MG  Tablet, Oral) Active. Finasteride (5MG  Tablet, Oral) Active. Glimepiride (2MG  Tablet, Oral) Active. Toujeo SoloStar (300UNIT/ML Soln Pen-inj, Subcutaneous) Active. Isosorbide Mononitrate ER (120MG  Tablet ER 24HR, Oral) Active. Ketoconazole (2% Cream, External) Active. Metoprolol Succinate ER (25MG  Tablet ER 24HR, Oral) Active. Nitroglycerin (0.4MG  Tab Sublingual, Sublingual) Active. Pantoprazole Sodium (40MG  Tablet DR, Oral) Active. rOPINIRole HCl (2MG  Tablet, Oral)  Active. Rosuvastatin Calcium (10MG  Tablet, Oral) Active. Tamsulosin HCl (0.4MG  Capsule, Oral) Active. Baby Aspirin (81MG  Tablet Chewable, Oral) Active. Victoza (18MG /3ML Soln Pen-inj, Subcutaneous) Active. Pyridium (100MG  Tablet, Oral) Active. Medications Reconciled  Social History Darden Palmer, [place]; 02/21/2020 10:39 AM) Alcohol use Occasional alcohol use. Caffeine use  Coffee. No drug use Tobacco use Former smoker.  Family History Darden Palmer, [place]; 02/21/2020 10:39 AM) Diabetes Mellitus Family Members In General. Heart Disease Father. Heart disease in male family member before age 5  Other Problems Darden Palmer, [place]; 02/21/2020 10:39 AM) Anxiety Disorder Arthritis Bladder Problems Chronic Renal Failure Syndrome Congestive Heart Failure Depression Diabetes Mellitus Enlarged Prostate Gastroesophageal Reflux Disease Hemorrhoids Myocardial infarction Sleep Apnea Umbilical Hernia Repair     Review of Systems Darden Palmer RMA; 02/21/2020 10:39 AM) General Present- Appetite Loss, Fatigue and Night Sweats. Not Present- Chills, Fever, Weight Gain and Weight Loss. Skin Not Present- Change in Wart/Mole, Dryness, Hives, Jaundice, New Lesions, Non-Healing Wounds, Rash and Ulcer. HEENT Present- Hearing Loss, Ringing in the Ears and Wears glasses/contact lenses. Not Present- Earache, Hoarseness, Nose Bleed, Oral Ulcers, Seasonal Allergies, Sinus Pain, Sore Throat, Visual Disturbances and Yellow Eyes. Respiratory Present- Snoring. Not Present- Bloody sputum, Chronic Cough, Difficulty Breathing and Wheezing. Cardiovascular Present- Swelling of Extremities. Not Present- Chest Pain, Difficulty Breathing Lying Down, Leg Cramps, Palpitations, Rapid Heart Rate and Shortness of Breath. Gastrointestinal Present- Abdominal Pain, Bloating, Change in Bowel Habits, Constipation and Hemorrhoids. Not Present- Bloody Stool, Chronic diarrhea, Difficulty Swallowing,  Excessive gas, Gets full quickly at meals, Indigestion, Nausea, Rectal Pain and Vomiting. Male Genitourinary Present- Frequency, Impotence, Nocturia, Painful Urination and Urgency. Not Present- Blood in Urine, Change in Urinary Stream and Urine Leakage. Musculoskeletal Not Present- Back Pain, Joint Pain, Joint Stiffness, Muscle Pain, Muscle Weakness and Swelling of Extremities. Neurological Not Present- Decreased Memory, Fainting, Headaches, Numbness, Seizures, Tingling, Tremor, Trouble walking and Weakness. Psychiatric Present- Change in Sleep Pattern and Depression. Not Present- Anxiety, Bipolar, Fearful and Frequent crying. Endocrine Not Present- Cold Intolerance, Excessive Hunger, Hair Changes, Heat Intolerance, Hot flashes and New Diabetes. Hematology Not Present- Blood Thinners, Easy Bruising, Excessive bleeding, Gland problems, HIV and Persistent Infections.  Vitals Lattie Haw [place] RMA; 02/21/2020 10:41 AM) 02/21/2020 10:40 AM Weight: 214.13 lb Height: 69in Body Surface Area: 2.13 m Body Mass Index: 31.62 kg/m  Temp.: 98.43F  Pulse: 94 (Regular)  P.OX: 98% (Room air) BP: 138/88(Sitting, Left Arm, Standard)        Physical Exam Isaac Hector MD; 02/21/2020 1:25 PM)  General Mental Status-Alert. General Appearance-Not in acute distress, Not Sickly. Orientation-Oriented X3. Hydration-Well hydrated. Voice-Normal.  Integumentary Global Assessment Upon inspection and palpation of skin surfaces of the - Axillae: non-tender, no inflammation or ulceration, no drainage. and Distribution of scalp and body hair is normal. General Characteristics Temperature - normal warmth is noted.  Head and Neck Head-normocephalic, atraumatic with no lesions or palpable masses. Face Global Assessment - atraumatic, no absence of expression. Neck Global Assessment - no abnormal movements, no bruit auscultated on the right, no bruit auscultated on the left, no decreased  range of motion, non-tender. Trachea-midline. Thyroid Gland Characteristics - non-tender.  Eye Eyeball - Left-Extraocular movements intact, No Nystagmus - Left. Eyeball - Right-Extraocular movements intact, No Nystagmus - Right. Cornea - Left-No Hazy - Left. Cornea - Right-No Hazy - Right. Sclera/Conjunctiva - Left-No scleral icterus, No Discharge - Left. Sclera/Conjunctiva - Right-No scleral icterus, No Discharge - Right. Pupil - Left-Direct reaction to light normal. Pupil - Right-Direct reaction to light normal.  ENMT Ears Pinna - Left - no drainage observed, no generalized tenderness observed. Pinna - Right - no drainage observed, no generalized tenderness observed. Nose and Sinuses External Inspection of the Nose - no destructive lesion observed. Inspection of the nares - Left - quiet respiration. Inspection of  the nares - Right - quiet respiration. Mouth and Throat Lips - Upper Lip - no fissures observed, no pallor noted. Lower Lip - no fissures observed, no pallor noted. Nasopharynx - no discharge present. Oral Cavity/Oropharynx - Tongue - no dryness observed. Oral Mucosa - no cyanosis observed. Hypopharynx - no evidence of airway distress observed.  Chest and Lung Exam Inspection Movements - Normal and Symmetrical. Accessory muscles - No use of accessory muscles in breathing. Palpation Palpation of the chest reveals - Non-tender. Auscultation Breath sounds - Normal and Clear.  Cardiovascular Auscultation Rhythm - Regular. Murmurs & Other Heart Sounds - Auscultation of the heart reveals - No Murmurs and No Systolic Clicks.  Abdomen Inspection Inspection of the abdomen reveals - No Visible peristalsis and No Abnormal pulsations. Umbilicus - No Bleeding, No Urine drainage. Palpation/Percussion Palpation and Percussion of the abdomen reveal - Soft, Non Tender, No Rebound tenderness, No Rigidity (guarding) and No Cutaneous hyperesthesia. Note: Abdomen  soft. Mild discomfort in the right suprapubic region but the rest of the abdomen seems Nontender. Not distended. Abdominal wall stent with diastasis recti. Incisions but no definite recurrent hernia.  Male Genitourinary Sexual Maturity Tanner 5 - Adult hair pattern and Adult penile size and shape. Note: Bilateral inguinal hernias. Right groin very sensitive but reducible. Otherwise normal external genitalia  Peripheral Vascular Upper Extremity Inspection - Left - No Cyanotic nailbeds - Left, Not Ischemic. Inspection - Right - No Cyanotic nailbeds - Right, Not Ischemic.  Neurologic Neurologic evaluation reveals -normal attention span and ability to concentrate, able to name objects and repeat phrases. Appropriate fund of knowledge , normal sensation and normal coordination. Mental Status Affect - not angry, not paranoid. Cranial Nerves-Normal Bilaterally. Gait-Normal.  Neuropsychiatric Mental status exam performed with findings of-able to articulate well with normal speech/language, rate, volume and coherence, thought content normal with ability to perform basic computations and apply abstract reasoning and no evidence of hallucinations, delusions, obsessions or homicidal/suicidal ideation.  Musculoskeletal Global Assessment Spine, Ribs and Pelvis - no instability, subluxation or laxity. Right Upper Extremity - no instability, subluxation or laxity.  Lymphatic Head & Neck  General Head & Neck Lymphatics: Bilateral - Description - No Localized lymphadenopathy. Axillary  General Axillary Region: Bilateral - Description - No Localized lymphadenopathy. Femoral & Inguinal  Generalized Femoral & Inguinal Lymphatics: Left - Description - No Localized lymphadenopathy. Right - Description - No Localized lymphadenopathy.    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 Now with pneumaturia and dark flecks in his urine.  Standard care would be rectosigmoid resection and possible bladder repair.  Ideally do robotically with hopefully immediate colorectal anastomosis. Most of his inflammation is in the right lower quadrant from a redundant sigmoid colon. This explains his right-sided symptoms.  I think it would be wise to get colonoscopy done prior to surgery to rule in malignancy or any other surprises. See if we can coordinate this with our gastroenterology so that he can get one bowel prep. It's been 2 years since his last colonoscopy with prior polyps. One of the adenomatous polyps was in the sigmoid region. Prep with colonoscopy and plan surgery the next day barring no surprises.  He is having persistent symptoms on Cipro. Looking at prior cultures had grown out [place] and he also had an organism that was indeterminate to Levaquin in the past. I think it would be wise to get a  urine culture to check identification and sensitivities. We'll see if we can send that off. We'll double check with Dr. Louis Meckel and see what he thinks.  I definitely would like ostomy marking preop just in case. Would like cystoscopy with firefly as well so that we can make sure we preserve his remaining kidney and ureter.  Current Plans Call back in 1 week with progress  PREOP COLON - ENCOUNTER FOR PREOPERATIVE EXAMINATION FOR GENERAL SURGICAL PROCEDURE (Z01.818)  Current Plans You are being scheduled for surgery- Our schedulers will call you.  You should hear from our office's scheduling department within 5 working days about the location, date, and time of surgery. We try to make accommodations for patient's preferences in scheduling surgery, but sometimes the OR schedule or the surgeon's schedule prevents [place] from making those accommodations.  If you have not heard from our office [phone]) in 5 working days, call the office and ask for your surgeon's  nurse.  If you have other questions about your diagnosis, plan, or surgery, call the office and ask for your surgeon's nurse.  Written instructions provided The anatomy & physiology of the digestive tract was discussed. The pathophysiology of the colon was discussed. Natural history risks without surgery was discussed. I feel the risks of no intervention will lead to serious problems that outweigh the operative risks; therefore, I recommended a partial colectomy to remove the pathology. Minimally invasive (Robotic/Laparoscopic) & open techniques were discussed.  Risks such as bleeding, infection, abscess, leak, reoperation, possible ostomy, hernia, heart attack, death, and other risks were discussed. I noted a good likelihood this will help address the problem. Goals of post-operative recovery were discussed as well. Need for adequate nutrition, daily bowel regimen and healthy physical activity, to optimize recovery was noted as well. We will work to minimize complications. Educational materials were available as well. Questions were answered. The patient expresses understanding & wishes to proceed with surgery.  Pt Education - [facility] Colon Bowel Prep 2018 ERAS/Miralax/Antibiotics Started Neomycin Sulfate 500 MG Oral Tablet, 2 (two) Tablet SEE NOTE, #6, 02/21/2020, No Refill. Local Order: Pharmacist Notes: TAKE TWO TABLETS AT 2 PM, 3 PM, AND 10 PM THE DAY PRIOR TO SURGERY Started Flagyl 500 MG Oral Tablet, 2 (two) Tablet SEE NOTE, #6, 02/21/2020, No Refill. Local Order: Pharmacist Notes: Take at 2pm, 3pm, and 10pm the day prior to your colon operation Pt Education - Pamphlet Given - Laparoscopic Colorectal Surgery: discussed with patient and provided information. Pt Education - [facility] Colectomy post-op instructions: discussed with patient and provided information.  BILATERAL INGUINAL HERNIA WITHOUT OBSTRUCTION OR GANGRENE, RECURRENCE NOT SPECIFIED (K40.20) Impression: Symptomatic right and  probable left inguinal hernias. He would benefit from hernia surgery at some point. However, I would not do at the same time as his colovesical fistula bladder surgery. Since he has symptoms in the right lower quadrant already, but still with at first and then regroup. Consider laparoscopic preperitoneal mesh TEP repair 6 weeks after colon surgery at the soonest.  Current Plans The anatomy & physiology of the abdominal wall and pelvic floor was discussed. The pathophysiology of hernias in the inguinal and pelvic region was discussed. Natural history risks such as progressive enlargement, pain, incarceration, and strangulation was discussed. Contributors to complications such as smoking, obesity, diabetes, prior surgery, etc were discussed.  I feel the risks of no intervention will lead to serious problems that outweigh the operative risks; therefore, I recommended surgery to reduce and repair the hernia. I explained laparoscopic techniques  with possible need for an open approach. I noted usual use of mesh to patch and/or buttress hernia repair  Risks such as bleeding, infection, abscess, need for further treatment, heart attack, death, and other risks were discussed. I noted a good likelihood this will help address the problem. Goals of post-operative recovery were discussed as well. Possibility that this will not correct all symptoms was explained. I stressed the importance of low-impact activity, aggressive pain control, avoiding constipation, & not pushing through pain to minimize risk of post-operative chronic pain or injury. Possibility of reherniation was discussed. We will work to minimize complications.  An educational handout further explaining the pathology & treatment options was given as well. Questions were answered. The patient expresses understanding & wishes to proceed with surgery.  Pt Education - [facility] Mesh education: discussed with patient and provided  information.  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   HISTORY OF ADENOMATOUS POLYP OF COLON (Z86.010)   URINARY FREQUENCY (R35.0)  Current Plans Urine Culture (03709) (Clean Catch; C&S urine culture; Pt is on Cipro; Hx of yeast)  Isaac Hector, MD, FACS, MASCRS Gastrointestinal and Minimally Invasive Surgery  [facility] [address] [place], [place] [address] [phone] Fax [phone] Main/Paging  CONTACT INFORMATION: Weekday (9AM-5PM) concerns: Call [facility] main office at [phone] Weeknight (5PM-9AM) or Weekend/Holiday concerns: Check [email or web address] for General Surgery [facility] coverage (Please, do not use SecureChat as it is not reliable communication to operating surgeons for immediate patient care)

## 2020-02-21 NOTE — H&P (View-Only) (Signed)
Julieta Gutting Appointment: 02/21/2020 10:15 AM Location: [facility] Patient #: [id number] DOB: [birth year] Unknown / Language: Cleophus Molt / Race: White Male  History of Present Illness [name] MD; 02/21/2020 1:48 PM) The patient is a 64 year old male who presents with colovesical fistula. Note for "Colovesical fistula": ` ` ` Patient sent for surgical consultation at the request of Burman Nieves, [facility]  Chief Complaint: Pneumaturia and bile in urine. Probable colovesical fistula. ` ` The patient is a patient with a very complicated urological history. 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 Required right distal ureteral resection in 2010. He's had bladder tumors treated cystoscopically and with bladder treatments. 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 2019 Dr. Louis Meckel. Since then, no strong evidence of any bladder tumor recurrence. However he had an episode of lower abdominal pain about a month ago. Rather severe. More right-sided. CAT scan showed inflammation. Involving the bladder as well. Patient has had episodes of brown urine. Occasional pneumaturia. Occasional flecks. Followed up with urology. CAT scan was done. 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 Surgical consultation recommended.  Patient denies any history of prior diverticulitis that he is aware of. He's had colonoscopies for polyps in the past. Followed by lumbar gastrology. Dr. [name] excised 2 adenomatous polyps in 2019. Three-year follow-up recommended which would mean 2012. Patient usually has been constipated since his problems of the past month he is moving his bowels at least once a day, sometimes more often. Normally he can walk a half hour without much difficulty. He is an insulin-requiring diabetic. A1c running around 8. No cardiac or pulmonary issues. No obvious sleep apnea. No dialysis. Hypertension mostly  controlled.  No personal nor family history of GI/colon cancer, inflammatory bowel disease, irritable bowel syndrome, allergy such as Celiac Sprue, dietary/dairy problems, colitis, ulcers nor gastritis. No recent sick contacts/gastroenteritis. No travel outside the country. No changes in diet. No dysphagia to solids or liquids. No significant heartburn or reflux. No melena, hematemesis, coffee ground emesis. No evidence of prior gastric/peptic ulceration.  (Review of systems as stated in this history (HPI) or in the review of systems. Otherwise all other 12 point ROS are negative) ` ` ###########################################`  This patient encounter took 65 minutes today to perform the following: obtain history, perform exam, review outside records, interpret tests & imaging, counsel the patient on their diagnosis; and, document this encounter, including findings & plan in the electronic health record (EHR).   Past Surgical History Darden Palmer, [place]; 02/21/2020 10:39 AM) Colon Polyp Removal - Colonoscopy Nephrectomy Right. Shoulder Surgery Right.  Diagnostic Studies History Darden Palmer, [place]; 02/21/2020 10:39 AM) Colonoscopy 1-5 years ago  Allergies Darden Palmer, RMA; 02/21/2020 10:40 AM) HYDROcodone Bitartrate *CHEMICALS* Dilaudid *ANALGESICS - OPIOID*  Medication History (Alisha Spillers, CMA; 02/21/2020 10:44 AM) ALPRAZolam (1MG  Tablet, Oral) Active. diazePAM (10MG  Tablet, Oral) Active. Finasteride (5MG  Tablet, Oral) Active. Glimepiride (2MG  Tablet, Oral) Active. Toujeo SoloStar (300UNIT/ML Soln Pen-inj, Subcutaneous) Active. Isosorbide Mononitrate ER (120MG  Tablet ER 24HR, Oral) Active. Ketoconazole (2% Cream, External) Active. Metoprolol Succinate ER (25MG  Tablet ER 24HR, Oral) Active. Nitroglycerin (0.4MG  Tab Sublingual, Sublingual) Active. Pantoprazole Sodium (40MG  Tablet DR, Oral) Active. rOPINIRole HCl (2MG  Tablet, Oral)  Active. Rosuvastatin Calcium (10MG  Tablet, Oral) Active. Tamsulosin HCl (0.4MG  Capsule, Oral) Active. Baby Aspirin (81MG  Tablet Chewable, Oral) Active. Victoza (18MG /3ML Soln Pen-inj, Subcutaneous) Active. Pyridium (100MG  Tablet, Oral) Active. Medications Reconciled  Social History Darden Palmer, [place]; 02/21/2020 10:39 AM) Alcohol use Occasional alcohol use. Caffeine use  Coffee. No drug use Tobacco use Former smoker.  Family History Darden Palmer, [place]; 02/21/2020 10:39 AM) Diabetes Mellitus Family Members In General. Heart Disease Father. Heart disease in male family member before age 68  Other Problems Darden Palmer, [place]; 02/21/2020 10:39 AM) Anxiety Disorder Arthritis Bladder Problems Chronic Renal Failure Syndrome Congestive Heart Failure Depression Diabetes Mellitus Enlarged Prostate Gastroesophageal Reflux Disease Hemorrhoids Myocardial infarction Sleep Apnea Umbilical Hernia Repair     Review of Systems Darden Palmer RMA; 02/21/2020 10:39 AM) General Present- Appetite Loss, Fatigue and Night Sweats. Not Present- Chills, Fever, Weight Gain and Weight Loss. Skin Not Present- Change in Wart/Mole, Dryness, Hives, Jaundice, New Lesions, Non-Healing Wounds, Rash and Ulcer. HEENT Present- Hearing Loss, Ringing in the Ears and Wears glasses/contact lenses. Not Present- Earache, Hoarseness, Nose Bleed, Oral Ulcers, Seasonal Allergies, Sinus Pain, Sore Throat, Visual Disturbances and Yellow Eyes. Respiratory Present- Snoring. Not Present- Bloody sputum, Chronic Cough, Difficulty Breathing and Wheezing. Cardiovascular Present- Swelling of Extremities. Not Present- Chest Pain, Difficulty Breathing Lying Down, Leg Cramps, Palpitations, Rapid Heart Rate and Shortness of Breath. Gastrointestinal Present- Abdominal Pain, Bloating, Change in Bowel Habits, Constipation and Hemorrhoids. Not Present- Bloody Stool, Chronic diarrhea, Difficulty Swallowing,  Excessive gas, Gets full quickly at meals, Indigestion, Nausea, Rectal Pain and Vomiting. Male Genitourinary Present- Frequency, Impotence, Nocturia, Painful Urination and Urgency. Not Present- Blood in Urine, Change in Urinary Stream and Urine Leakage. Musculoskeletal Not Present- Back Pain, Joint Pain, Joint Stiffness, Muscle Pain, Muscle Weakness and Swelling of Extremities. Neurological Not Present- Decreased Memory, Fainting, Headaches, Numbness, Seizures, Tingling, Tremor, Trouble walking and Weakness. Psychiatric Present- Change in Sleep Pattern and Depression. Not Present- Anxiety, Bipolar, Fearful and Frequent crying. Endocrine Not Present- Cold Intolerance, Excessive Hunger, Hair Changes, Heat Intolerance, Hot flashes and New Diabetes. Hematology Not Present- Blood Thinners, Easy Bruising, Excessive bleeding, Gland problems, HIV and Persistent Infections.  Vitals Lattie Haw Scofield RMA; 02/21/2020 10:41 AM) 02/21/2020 10:40 AM Weight: 214.13 lb Height: 69in Body Surface Area: 2.13 m Body Mass Index: 31.62 kg/m  Temp.: 98.55F  Pulse: 94 (Regular)  P.OX: 98% (Room air) BP: 138/88(Sitting, Left Arm, Standard)        Physical Exam [name] MD; 02/21/2020 1:25 PM)  General Mental Status-Alert. General Appearance-Not in acute distress, Not Sickly. Orientation-Oriented X3. Hydration-Well hydrated. Voice-Normal.  Integumentary Global Assessment Upon inspection and palpation of skin surfaces of the - Axillae: non-tender, no inflammation or ulceration, no drainage. and Distribution of scalp and body hair is normal. General Characteristics Temperature - normal warmth is noted.  Head and Neck Head-normocephalic, atraumatic with no lesions or palpable masses. Face Global Assessment - atraumatic, no absence of expression. Neck Global Assessment - no abnormal movements, no bruit auscultated on the right, no bruit auscultated on the left, no decreased  range of motion, non-tender. Trachea-midline. Thyroid Gland Characteristics - non-tender.  Eye Eyeball - Left-Extraocular movements intact, No Nystagmus - Left. Eyeball - Right-Extraocular movements intact, No Nystagmus - Right. Cornea - Left-No Hazy - Left. Cornea - Right-No Hazy - Right. Sclera/Conjunctiva - Left-No scleral icterus, No Discharge - Left. Sclera/Conjunctiva - Right-No scleral icterus, No Discharge - Right. Pupil - Left-Direct reaction to light normal. Pupil - Right-Direct reaction to light normal.  ENMT Ears Pinna - Left - no drainage observed, no generalized tenderness observed. Pinna - Right - no drainage observed, no generalized tenderness observed. Nose and Sinuses External Inspection of the Nose - no destructive lesion observed. Inspection of the nares - Left - quiet respiration. Inspection of  the nares - Right - quiet respiration. Mouth and Throat Lips - Upper Lip - no fissures observed, no pallor noted. Lower Lip - no fissures observed, no pallor noted. Nasopharynx - no discharge present. Oral Cavity/Oropharynx - Tongue - no dryness observed. Oral Mucosa - no cyanosis observed. Hypopharynx - no evidence of airway distress observed.  Chest and Lung Exam Inspection Movements - Normal and Symmetrical. Accessory muscles - No use of accessory muscles in breathing. Palpation Palpation of the chest reveals - Non-tender. Auscultation Breath sounds - Normal and Clear.  Cardiovascular Auscultation Rhythm - Regular. Murmurs & Other Heart Sounds - Auscultation of the heart reveals - No Murmurs and No Systolic Clicks.  Abdomen Inspection Inspection of the abdomen reveals - No Visible peristalsis and No Abnormal pulsations. Umbilicus - No Bleeding, No Urine drainage. Palpation/Percussion Palpation and Percussion of the abdomen reveal - Soft, Non Tender, No Rebound tenderness, No Rigidity (guarding) and No Cutaneous hyperesthesia. Note: Abdomen  soft. Mild discomfort in the right suprapubic region but the rest of the abdomen seems Nontender. Not distended. Abdominal wall stent with diastasis recti. Incisions but no definite recurrent hernia.  Male Genitourinary Sexual Maturity Tanner 5 - Adult hair pattern and Adult penile size and shape. Note: Bilateral inguinal hernias. Right groin very sensitive but reducible. Otherwise normal external genitalia  Peripheral Vascular Upper Extremity Inspection - Left - No Cyanotic nailbeds - Left, Not Ischemic. Inspection - Right - No Cyanotic nailbeds - Right, Not Ischemic.  Neurologic Neurologic evaluation reveals -normal attention span and ability to concentrate, able to name objects and repeat phrases. Appropriate fund of knowledge , normal sensation and normal coordination. Mental Status Affect - not angry, not paranoid. Cranial Nerves-Normal Bilaterally. Gait-Normal.  Neuropsychiatric Mental status exam performed with findings of-able to articulate well with normal speech/language, rate, volume and coherence, thought content normal with ability to perform basic computations and apply abstract reasoning and no evidence of hallucinations, delusions, obsessions or homicidal/suicidal ideation.  Musculoskeletal Global Assessment Spine, Ribs and Pelvis - no instability, subluxation or laxity. Right Upper Extremity - no instability, subluxation or laxity.  Lymphatic Head & Neck  General Head & Neck Lymphatics: Bilateral - Description - No Localized lymphadenopathy. Axillary  General Axillary Region: Bilateral - Description - No Localized lymphadenopathy. Femoral & Inguinal  Generalized Femoral & Inguinal Lymphatics: Left - Description - No Localized lymphadenopathy. Right - Description - No Localized lymphadenopathy.    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 Now with pneumaturia and dark flecks in his urine.  Standard care would be rectosigmoid resection and possible bladder repair.  Ideally do robotically with hopefully immediate colorectal anastomosis. Most of his inflammation is in the right lower quadrant from a redundant sigmoid colon. This explains his right-sided symptoms.  I think it would be wise to get colonoscopy done prior to surgery to rule in malignancy or any other surprises. See if we can coordinate this with our gastroenterology so that he can get one bowel prep. It's been 2 years since his last colonoscopy with prior polyps. One of the adenomatous polyps was in the sigmoid region. Prep with colonoscopy and plan surgery the next day barring no surprises.  He is having persistent symptoms on Cipro. Looking at prior cultures had grown out [place] and he also had an organism that was indeterminate to Levaquin in the past. I think it would be wise to get a  urine culture to check identification and sensitivities. We'll see if we can send that off. We'll double check with Dr. Louis Meckel and see what he thinks.  I definitely would like ostomy marking preop just in case. Would like cystoscopy with firefly as well so that we can make sure we preserve his remaining kidney and ureter.  Current Plans Call back in 1 week with progress  PREOP COLON - ENCOUNTER FOR PREOPERATIVE EXAMINATION FOR GENERAL SURGICAL PROCEDURE (Z01.818)  Current Plans You are being scheduled for surgery- Our schedulers will call you.  You should hear from our office's scheduling department within 5 working days about the location, date, and time of surgery. We try to make accommodations for patient's preferences in scheduling surgery, but sometimes the OR schedule or the surgeon's schedule prevents [place] from making those accommodations.  If you have not heard from our office [phone]) in 5 working days, call the office and ask for your surgeon's  nurse.  If you have other questions about your diagnosis, plan, or surgery, call the office and ask for your surgeon's nurse.  Written instructions provided The anatomy & physiology of the digestive tract was discussed. The pathophysiology of the colon was discussed. Natural history risks without surgery was discussed. I feel the risks of no intervention will lead to serious problems that outweigh the operative risks; therefore, I recommended a partial colectomy to remove the pathology. Minimally invasive (Robotic/Laparoscopic) & open techniques were discussed.  Risks such as bleeding, infection, abscess, leak, reoperation, possible ostomy, hernia, heart attack, death, and other risks were discussed. I noted a good likelihood this will help address the problem. Goals of post-operative recovery were discussed as well. Need for adequate nutrition, daily bowel regimen and healthy physical activity, to optimize recovery was noted as well. We will work to minimize complications. Educational materials were available as well. Questions were answered. The patient expresses understanding & wishes to proceed with surgery.  Pt Education - [facility] Colon Bowel Prep 2018 ERAS/Miralax/Antibiotics Started Neomycin Sulfate 500 MG Oral Tablet, 2 (two) Tablet SEE NOTE, #6, 02/21/2020, No Refill. Local Order: Pharmacist Notes: TAKE TWO TABLETS AT 2 PM, 3 PM, AND 10 PM THE DAY PRIOR TO SURGERY Started Flagyl 500 MG Oral Tablet, 2 (two) Tablet SEE NOTE, #6, 02/21/2020, No Refill. Local Order: Pharmacist Notes: Take at 2pm, 3pm, and 10pm the day prior to your colon operation Pt Education - Pamphlet Given - Laparoscopic Colorectal Surgery: discussed with patient and provided information. Pt Education - [facility] Colectomy post-op instructions: discussed with patient and provided information.  BILATERAL INGUINAL HERNIA WITHOUT OBSTRUCTION OR GANGRENE, RECURRENCE NOT SPECIFIED (K40.20) Impression: Symptomatic right and  probable left inguinal hernias. He would benefit from hernia surgery at some point. However, I would not do at the same time as his colovesical fistula bladder surgery. Since he has symptoms in the right lower quadrant already, but still with at first and then regroup. Consider laparoscopic preperitoneal mesh TEP repair 6 weeks after colon surgery at the soonest.  Current Plans The anatomy & physiology of the abdominal wall and pelvic floor was discussed. The pathophysiology of hernias in the inguinal and pelvic region was discussed. Natural history risks such as progressive enlargement, pain, incarceration, and strangulation was discussed. Contributors to complications such as smoking, obesity, diabetes, prior surgery, etc were discussed.  I feel the risks of no intervention will lead to serious problems that outweigh the operative risks; therefore, I recommended surgery to reduce and repair the hernia. I explained laparoscopic techniques  with possible need for an open approach. I noted usual use of mesh to patch and/or buttress hernia repair  Risks such as bleeding, infection, abscess, need for further treatment, heart attack, death, and other risks were discussed. I noted a good likelihood this will help address the problem. Goals of post-operative recovery were discussed as well. Possibility that this will not correct all symptoms was explained. I stressed the importance of low-impact activity, aggressive pain control, avoiding constipation, & not pushing through pain to minimize risk of post-operative chronic pain or injury. Possibility of reherniation was discussed. We will work to minimize complications.  An educational handout further explaining the pathology & treatment options was given as well. Questions were answered. The patient expresses understanding & wishes to proceed with surgery.  Pt Education - [facility] Mesh education: discussed with patient and provided  information.  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   HISTORY OF ADENOMATOUS POLYP OF COLON (Z86.010)   URINARY FREQUENCY (R35.0)  Current Plans Urine Culture (20355) (Clean Catch; C&S urine culture; Pt is on Cipro; Hx of yeast)  [name], MD, FACS, MASCRS Gastrointestinal and Minimally Invasive Surgery  [facility] [address], [place],  [address] [phone] Fax [phone] Main/Paging  CONTACT INFORMATION: Weekday (9AM-5PM) concerns: Call [facility] main office at [phone] Weeknight (5PM-9AM) or Weekend/Holiday concerns: Check [email or web address] for General Surgery [facility] coverage (Please, do not use SecureChat as it is not reliable communication to operating surgeons for immediate patient care)

## 2020-02-23 ENCOUNTER — Other Ambulatory Visit (HOSPITAL_BASED_OUTPATIENT_CLINIC_OR_DEPARTMENT_OTHER): Payer: Self-pay | Admitting: Surgery

## 2020-02-23 MED FILL — FLUCONAZOLE 200 MG TAB: 200 | 14 days supply | Qty: 14 | Fill #0

## 2020-02-27 ENCOUNTER — Telehealth: Payer: Self-pay | Admitting: Gastroenterology

## 2020-02-27 NOTE — Telephone Encounter (Signed)
Left message with Caryl Pina at [facility] to return my call.

## 2020-02-27 NOTE — Telephone Encounter (Signed)
Caryl Pina returned my call and states they have moved patient's surgery to 03/09/20 and needs Colonoscopy scheduled on 03/08/20. Rescheduled Colon for 03/08/20 at 10:30am. Presvisit scheduled for 02/29/20 at 11am. Called patient to inform him of appointments scheduled. Patient reports he has been covid vaccinated.

## 2020-02-27 NOTE — Telephone Encounter (Signed)
Please read Dr. Clyda Greener H&P from 02/21/20 under notes. Patient has a colovesical fistula that needs to be repaired. Surgery is scheduled for 03/06/20. Dr. Johney Maine would like patient prepped once for surgery and have colonoscopy on 03/05/20. Patient's colon recall is due 04/2021. Please advise Dr. [name] if patient can be scheduled for direct Colon on 03/05/20, the day prior to surgery.

## 2020-02-27 NOTE — Telephone Encounter (Signed)
OK for direct colonoscopy with me on 10/25 at 0800. Dr. Johney Maine likely would keep on clear liquids after the colonoscopy for surgery on 10/26.

## 2020-02-28 ENCOUNTER — Telehealth: Payer: Self-pay | Admitting: Internal Medicine

## 2020-02-28 ENCOUNTER — Telehealth: Payer: Self-pay | Admitting: *Deleted

## 2020-02-28 ENCOUNTER — Other Ambulatory Visit: Payer: Self-pay | Admitting: Urology

## 2020-02-28 NOTE — Telephone Encounter (Signed)
Call received from patient stating that he needs to change his scheduled appts on 03/13/20 due to he has an upcoming surgery with Dr. Johney Maine scheduled for 03/09/20.  Informed pt that I would send a message to scheduling and they would give him a call with an appt time that works for him prior to his surgery date. Pt appreciative of assistance and has no further questions at this time.

## 2020-02-28 NOTE — Patient Instructions (Addendum)
DUE TO COVID-19 ONLY ONE VISITOR IS ALLOWED TO COME WITH YOU AND STAY IN THE WAITING ROOM ONLY DURING PRE OP AND  PROCEDURE.   IF YOU WILL BE ADMITTED INTO THE HOSPITAL YOU ARE ALLOWED ONE SUPPORT PERSON DURING VISITATION HOURS ONLY (10AM -8PM)   . The support person may change daily. . The support person must pass our screening, gel in and out, and wear a mask at all times, including in the patient's room. . Patients must also wear a mask when staff or their support person are in the room.   COVID SWAB TESTING MUST BE COMPLETED ON:  Tuesday, 03-06-20 @ 12:00 PM   [address] [place], [place] [address]  (Must self quarantine after testing. Follow instructions on handout.)        Your procedure is scheduled on:  Friday, 03-09-20   Report to [facility] Main  Entrance    Report to admitting at 11:00 AM   Call this number if you have problems the morning of surgery [phone]    Bring CPAP  Mask and tubing day of surgery   Drink clear liquids day of prep to prevent dehydration   Do not eat food :After Midnight.   May have liquids until 10:00 AM  day of surgery  CLEAR LIQUID DIET  Foods Allowed                                                                     Foods Excluded  Water, Black Coffee and tea, regular and decaf              liquids that you cannot  Plain Jell-O in any flavor  (No red)                                    see through such as: Fruit ices (not with fruit pulp)                                      milk, soups, orange juice              Iced Popsicles (No red)                                      All solid food                                   Apple juices Sports drinks like Gatorade (No red) Lightly seasoned clear broth or consume(fat free) Sugar, honey syrup  Sample Menu Breakfast                                Lunch  Supper Cranberry juice                    Beef broth                            Chicken  broth Jell-O                                     Grape juice                           Apple juice Coffee or tea                        Jell-O                                      Popsicle                                                  Coffee or tea                        Coffee or tea      Drink 2 G2 drinks the night before surgery.     Complete one G2 drink the morning of surgery 3 hours prior to scheduled surgery at 10:00 AM.   Oral Hygiene is also important to reduce your risk of infection.                                    Remember - BRUSH YOUR TEETH THE MORNING OF SURGERY WITH YOUR REGULAR TOOTHPASTE   Do NOT smoke after Midnight   Take these medicines the morning of surgery with A SIP OF WATER:  Isosorbide, Pantoprazole, Rosuvastatin, Tamsulosin     How to Manage Your Diabetes Before and After Surgery  Why is it important to control my blood sugar before and after surgery? . Improving blood sugar levels before and after surgery helps healing and can limit problems. . A way of improving blood sugar control is eating a healthy diet by: o  Eating less sugar and carbohydrates o  Increasing activity/exercise o  Talking with your doctor about reaching your blood sugar goals . High blood sugars (greater than 180 mg/dL) can raise your risk of infections and slow your recovery, so you will need to focus on controlling your diabetes during the weeks before surgery. . Make sure that the doctor who takes care of your diabetes knows about your planned surgery including the date and location.  How do I manage my blood sugar before surgery? . Check your blood sugar at least 4 times a day, starting 2 days before surgery, to make sure that the level is not too high or low. o Check your blood sugar the morning of your surgery when you wake up and every 2 hours until you get to the Short Stay unit. . If your blood sugar is less than 70 mg/dL, you will need to treat for low  blood sugar: o Do  not take insulin. o Treat a low blood sugar (less than 70 mg/dL) with  cup of clear juice (cranberry or apple), 4 glucose tablets, OR glucose gel. o Recheck blood sugar in 15 minutes after treatment (to make sure it is greater than 70 mg/dL). If your blood sugar is not greater than 70 mg/dL on recheck, call [phone] for further instructions. . Report your blood sugar to the short stay nurse when you get to Short Stay.  . If you are admitted to the hospital after surgery: o Your blood sugar will be checked by the staff and you will probably be given insulin after surgery (instead of oral diabetes medicines) to make sure you have good blood sugar levels. o The goal for blood sugar control after surgery is 80-180 mg/dL.   WHAT DO I DO ABOUT MY DIABETES MEDICATION?  Marland Kitchen Do not take oral diabetes medicines (pills) the morning of surgery.  . THE DAY BEFORE SURGERY:  Take Glimepiride and Victoza as prescribed        Take 20 units of Insulin Glargine (Toujeo) evening dose       . THE MORNING OF SURGERY:  Do not take Glimepiride or Victoza.       Take 20 units of Insulin Glargine (Toujeo) morning of surgery .  Marland Kitchen The day of surgery, do not take other diabetes injectables, including Byetta (exenatide), Bydureon (exenatide ER), Victoza (liraglutide), or Trulicity (dulaglutide).   Reviewed and Endorsed by [facility] Patient Education Committee, August 2015                                You may not have any metal on your body including  jewelry, and body piercings             Do not wear lotions, powders, perfumes/cologne, or deodorant             Men may shave face and neck.   Do not bring valuables to the hospital. [place].   Contacts, dentures or bridgework may not be worn into surgery.   Bring small overnight bag day of surgery.                 Please read over the following fact sheets you were given: IF YOU HAVE QUESTIONS ABOUT YOUR PRE OP  INSTRUCTIONS PLEASE CALL  [phone]   [place] - Preparing for Surgery Before surgery, you can play an important role.  Because skin is not sterile, your skin needs to be as free of germs as possible.  You can reduce the number of germs on your skin by washing with CHG (chlorahexidine gluconate) soap before surgery.  CHG is an antiseptic cleaner which kills germs and bonds with the skin to continue killing germs even after washing. Please DO NOT use if you have an allergy to CHG or antibacterial soaps.  If your skin becomes reddened/irritated stop using the CHG and inform your nurse when you arrive at Short Stay. Do not shave (including legs and underarms) for at least 48 hours prior to the first CHG shower.  You may shave your face/neck.  Please follow these instructions carefully:  1.  Shower with CHG Soap the night before surgery and the  morning of surgery.  2.  If you choose to wash your hair, wash your hair first as usual with your normal  shampoo.  3.  After you shampoo, rinse your hair and body thoroughly to remove the shampoo.                             4.  Use CHG as you would any other liquid soap.  You can apply chg directly to the skin and wash.  Gently with a scrungie or clean washcloth.  5.  Apply the CHG Soap to your body ONLY FROM THE NECK DOWN.   Do   not use on face/ open                           Wound or open sores. Avoid contact with eyes, ears mouth and   genitals (private parts).                       Wash face,  Genitals (private parts) with your normal soap.             6.  Wash thoroughly, paying special attention to the area where your    surgery  will be performed.  7.  Thoroughly rinse your body with warm water from the neck down.  8.  DO NOT shower/wash with your normal soap after using and rinsing off the CHG Soap.                9.  Pat yourself dry with a clean towel.            10.  Wear clean pajamas.            11.  Place clean sheets on your bed the  night of your first shower and do not  sleep with pets. Day of Surgery : Do not apply any lotions/deodorants the morning of surgery.  Please wear clean clothes to the hospital/surgery center.  FAILURE TO FOLLOW THESE INSTRUCTIONS MAY RESULT IN THE CANCELLATION OF YOUR SURGERY  PATIENT SIGNATURE_________________________________  NURSE SIGNATURE__________________________________  ________________________________________________________________________   Adam Phenix  An incentive spirometer is a tool that can help keep your lungs clear and active. This tool measures how well you are filling your lungs with each breath. Taking long deep breaths may help reverse or decrease the chance of developing breathing (pulmonary) problems (especially infection) following:  A long period of time when you are unable to move or be active. BEFORE THE PROCEDURE   If the spirometer includes an indicator to show your best effort, your nurse or respiratory therapist will set it to a desired goal.  If possible, sit up straight or lean slightly forward. Try not to slouch.  Hold the incentive spirometer in an upright position. INSTRUCTIONS FOR USE  1. Sit on the edge of your bed if possible, or sit up as far as you can in bed or on a chair. 2. Hold the incentive spirometer in an upright position. 3. Breathe out normally. 4. Place the mouthpiece in your mouth and seal your lips tightly around it. 5. Breathe in slowly and as deeply as possible, raising the piston or the ball toward the top of the column. 6. Hold your breath for 3-5 seconds or for as long as possible. Allow the piston or ball to fall to the bottom of the column. 7. Remove the mouthpiece from your mouth and breathe out normally. 8. Rest for a few seconds and repeat Steps 1 through 7 at least  10 times every 1-2 hours when you are awake. Take your time and take a few normal breaths between deep breaths. 9. The spirometer may include an  indicator to show your best effort. Use the indicator as a goal to work toward during each repetition. 10. After each set of 10 deep breaths, practice coughing to be sure your lungs are clear. If you have an incision (the cut made at the time of surgery), support your incision when coughing by placing a pillow or rolled up towels firmly against it. Once you are able to get out of bed, walk around indoors and cough well. You may stop using the incentive spirometer when instructed by your caregiver.  RISKS AND COMPLICATIONS  Take your time so you do not get dizzy or light-headed.  If you are in pain, you may need to take or ask for pain medication before doing incentive spirometry. It is harder to take a deep breath if you are having pain. AFTER USE  Rest and breathe slowly and easily.  It can be helpful to keep track of a log of your progress. Your caregiver can provide you with a simple table to help with this. If you are using the spirometer at home, follow these instructions: [place] IF:   You are having difficultly using the spirometer.  You have trouble using the spirometer as often as instructed.  Your pain medication is not giving enough relief while using the spirometer.  You develop fever of 100.5 F (38.1 C) or higher. SEEK IMMEDIATE MEDICAL CARE IF:   You cough up bloody sputum that had not been present before.  You develop fever of 102 F (38.9 C) or greater.  You develop worsening pain at or near the incision site. MAKE SURE YOU:   Understand these instructions.  Will watch your condition.  Will get help right away if you are not doing well or get worse. Document Released: 09/08/2006 Document Revised: 07/21/2011 Document Reviewed: 11/09/2006 Exit[employer] Patient Information 2014 Exit[employer], [place].   ________________________________________________________________________  WHAT IS A BLOOD TRANSFUSION? Blood Transfusion Information  A transfusion is the  replacement of blood or some of its parts. Blood is made up of multiple cells which provide different functions.  Red blood cells carry oxygen and are used for blood loss replacement.  White blood cells fight against infection.  Platelets control bleeding.  Plasma helps clot blood.  Other blood products are available for specialized needs, such as hemophilia or other clotting disorders. BEFORE THE TRANSFUSION  Who gives blood for transfusions?   Healthy volunteers who are fully evaluated to make sure their blood is safe. This is blood bank blood. Transfusion therapy is the safest it has ever been in the practice of medicine. Before blood is taken from a donor, a complete history is taken to make sure that person has no history of diseases nor engages in risky social behavior (examples are intravenous drug use or sexual activity with multiple partners). The donor's travel history is screened to minimize risk of transmitting infections, such as malaria. The donated blood is tested for signs of infectious diseases, such as HIV and hepatitis. The blood is then tested to be sure it is compatible with you in order to minimize the chance of a transfusion reaction. If you or a relative donates blood, this is often done in anticipation of surgery and is not appropriate for emergency situations. It takes many days to process the donated blood. RISKS AND COMPLICATIONS Although transfusion therapy  is very safe and saves many lives, the main dangers of transfusion include:   Getting an infectious disease.  Developing a transfusion reaction. This is an allergic reaction to something in the blood you were given. Every precaution is taken to prevent this. The decision to have a blood transfusion has been considered carefully by your caregiver before blood is given. Blood is not given unless the benefits outweigh the risks. AFTER THE TRANSFUSION  Right after receiving a blood transfusion, you will usually  feel much better and more energetic. This is especially true if your red blood cells have gotten low (anemic). The transfusion raises the level of the red blood cells which carry oxygen, and this usually causes an energy increase.  The nurse administering the transfusion will monitor you carefully for complications. HOME CARE INSTRUCTIONS  No special instructions are needed after a transfusion. You may find your energy is better. Speak with your caregiver about any limitations on activity for underlying diseases you may have. SEEK MEDICAL CARE IF:   Your condition is not improving after your transfusion.  You develop redness or irritation at the intravenous (IV) site. SEEK IMMEDIATE MEDICAL CARE IF:  Any of the following symptoms occur over the next 12 hours:  Shaking chills.  You have a temperature by mouth above 102 F (38.9 C), not controlled by medicine.  Chest, back, or muscle pain.  People around you feel you are not acting correctly or are confused.  Shortness of breath or difficulty breathing.  Dizziness and fainting.  You get a rash or develop hives.  You have a decrease in urine output.  Your urine turns a dark color or changes to pink, red, or brown. Any of the following symptoms occur over the next 10 days:  You have a temperature by mouth above 102 F (38.9 C), not controlled by medicine.  Shortness of breath.  Weakness after normal activity.  The white part of the eye turns yellow (jaundice).  You have a decrease in the amount of urine or are urinating less often.  Your urine turns a dark color or changes to pink, red, or brown. Document Released: 04/25/2000 Document Revised: 07/21/2011 Document Reviewed: 12/13/2007 [facility] Patient Information 2014 [place], [place].  _______________________________________________________________________

## 2020-02-28 NOTE — Telephone Encounter (Addendum)
Patient called to let us know he is having surgery and needs to cancel upcoming labs and CPE. He will call back and reschedule when he is back on his feet to reschedule before the end of year. Will at least do AMW by phone.

## 2020-02-28 NOTE — Progress Notes (Addendum)
COVID Vaccine Completed:  x2 Date COVID Vaccine completed:  08-06-19 & 08-30-19 [facility] COVID vaccine manufacturer: [place]   PCP - Tedra Senegal, MD Cardiologist - [name], MD.  Last OV 09-22-19  Chest x-ray -  EKG - 09-22-19 in Epic Stress Test - 12-16-2006 in Epic ECHO - 02-03-17 in Epic Cardiac Cath - 5+ years ago Pacemaker/ICD device last checked: [facility] - 02-22-2013 in Epic   Sleep Study - 5+ years ago, positive for sleep apnea CPAP - Yes  Fasting Blood Sugar - 150 to 233 Checks Blood Sugar - once or twice daily  Blood Thinner Instructions: Aspirin Instructions:  ASA 81 mg  Last Dose:  Anesthesia review:  CAD, hx of MI, OSA  Patient denies shortness of breath, fever, cough and chest pain at PAT appointment   Patient verbalized understanding of instructions that were given to them at the PAT appointment. Patient was also instructed that they will need to review over the PAT instructions again at home before surgery.

## 2020-02-29 ENCOUNTER — Encounter: Payer: Self-pay | Admitting: Surgery

## 2020-02-29 ENCOUNTER — Encounter (HOSPITAL_COMMUNITY)
Admission: RE | Admit: 2020-02-29 | Discharge: 2020-02-29 | Disposition: A | Discharge status: Home / Self Care | Payer: Medicare Other | Source: Ambulatory Visit | Attending: Surgery | Admitting: Surgery

## 2020-02-29 ENCOUNTER — Other Ambulatory Visit: Payer: Self-pay | Admitting: Gastroenterology

## 2020-02-29 ENCOUNTER — Encounter (HOSPITAL_COMMUNITY): Payer: Self-pay

## 2020-02-29 ENCOUNTER — Ambulatory Visit (AMBULATORY_SURGERY_CENTER): Payer: Self-pay | Admitting: *Deleted

## 2020-02-29 ENCOUNTER — Encounter: Payer: Self-pay | Admitting: Gastroenterology

## 2020-02-29 ENCOUNTER — Other Ambulatory Visit: Payer: Self-pay

## 2020-02-29 VITALS — Ht 69.0 in | Wt 210.0 lb

## 2020-02-29 DIAGNOSIS — Z01818 Encounter for other preprocedural examination: Secondary | ICD-10-CM | POA: Insufficient documentation

## 2020-02-29 DIAGNOSIS — Z86010 Personal history of colonic polyps: Secondary | ICD-10-CM

## 2020-02-29 HISTORY — DX: Partial loss of teeth, unspecified cause, unspecified class: K08.409

## 2020-02-29 LAB — CBC
HCT: 45.1 % (ref 39.0–52.0)
Hemoglobin: 15.2 g/dL (ref 13.0–17.0)
MCH: 31.5 pg (ref 26.0–34.0)
MCHC: 33.7 g/dL (ref 30.0–36.0)
MCV: 93.4 fL (ref 80.0–100.0)
Platelets: 122 10*3/uL — ABNORMAL LOW (ref 150–400)
RBC: 4.83 MIL/uL (ref 4.22–5.81)
RDW: 13.2 % (ref 11.5–15.5)
WBC: 4.7 10*3/uL (ref 4.0–10.5)
nRBC: 0 % (ref 0.0–0.2)

## 2020-02-29 LAB — BASIC METABOLIC PANEL
Anion gap: 10 (ref 5–15)
BUN: 26 mg/dL — ABNORMAL HIGH (ref 8–23)
CO2: 23 mmol/L (ref 22–32)
Calcium: 8.8 mg/dL — ABNORMAL LOW (ref 8.9–10.3)
Chloride: 102 mmol/L (ref 98–111)
Creatinine, Ser: 2.07 mg/dL — ABNORMAL HIGH (ref 0.61–1.24)
GFR, Estimated: 33 mL/min — ABNORMAL LOW (ref >60)
Glucose, Bld: 168 mg/dL — ABNORMAL HIGH (ref 70–99)
Potassium: 4.3 mmol/L (ref 3.5–5.1)
Sodium: 135 mmol/L (ref 135–145)

## 2020-02-29 LAB — GLUCOSE, CAPILLARY: Glucose-Capillary: 153 mg/dL — ABNORMAL HIGH (ref 70–99)

## 2020-02-29 MED ORDER — PLENVU 140 G PO SOLR: 1.0000 | Freq: Once | ORAL | 0 refills | Status: DC

## 2020-02-29 MED FILL — PLENVU 140 GM SOLR: 140 | 1 days supply | Qty: 3 | Fill #0

## 2020-02-29 NOTE — Progress Notes (Signed)
No egg or soy allergy known to patient  No issues with past sedation with any surgeries or procedures no intubation problems in the past  No FH of Malignant Hyperthermia No diet pills per patient No home 02 use per patient  No blood thinners per patient  Pt denies issues with constipation  No A fib or A flutter  EMMI video to pt or via MyChart  COVID 19 guidelines implemented in PV today with Pt and RN   Plenvu Coupon given to pt in PV today   Due to the COVID-19 pandemic we are asking patients to follow these guidelines. Please only bring one care partner. Please be aware that your care partner may wait in the car in the parking lot or if they feel like they will be too hot to wait in the car, they may wait in the lobby on the 4th floor. All care partners are required to wear a mask the entire time (we do not have any that we can provide them), they need to practice social distancing, and we will do a Covid check for all patient's and care partners when you arrive. Also we will check their temperature and your temperature. If the care partner waits in their car they need to stay in the parking lot the entire time and we will call them on their cell phone when the patient is ready for discharge so they can bring the car to the front of the building. Also all patient's will need to wear a mask into building.  

## 2020-02-29 NOTE — Consult Note (Signed)
[facility] Nurse requested for preoperative stoma site marking  Discussed surgical procedure and stoma creation with patient.  Explained role of the [facility] nurse team.  Provided the patient with educational booklet and provided samples of pouching options.  Answered patient and family questions.   Examined patient  sitting, and standing in order to place the marking in the patient's visual field, away from any creases or abdominal contour issues and within the rectus muscle.  Attempted to mark below the patient's belt line.   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  Marked for ileostomy in the RLQ  4 cm to the right of the umbilicus and 4  cm below the umbilicus.   Patient's abdomen cleansed with CHG wipes at site markings, allowed to air dry prior to marking.Covered mark with thin film transparent dressing to preserve mark until date of surgery.   [facility] Nurse team will follow up with patient after surgery for continue ostomy care and teaching.   Domenic Moras MSN, RN, FNP-BC [facility] Wound, Ostomy, Continence Nurse Pager [phone]

## 2020-02-29 NOTE — Progress Notes (Signed)
BMP sent to Dr. Johney [place] and Dr. Louis Meckel to review.

## 2020-03-01 LAB — HEMOGLOBIN A1C
Hgb A1c MFr Bld: 9.2 % — ABNORMAL HIGH (ref 4.8–5.6)
Mean Plasma Glucose: 217 mg/dL

## 2020-03-01 NOTE — Progress Notes (Signed)
Anesthesia Chart Review   Case: [id number] Date/Time: 03/09/20 1245   Procedures:      ROBOTIC RESECTION OF COLON, RECTOSIGMOID, POSSIBLE BLADDER REPAIR (N/A )     POSSIBLE OSTOMY (N/A )     PANENDOSCOPY RIGID (N/A )     CYSTOSCOPY WITH FIREFLYER  PLACEMENT (N/A )   Anesthesia type: General   Pre-op diagnosis: colovesical fistula   Location: [facility] 02 / [facility]   Surgeons: [name], MD; [name], MD      DISCUSSION:64 y.o. former smoker (quit 05/13/07) with h/o HTN, GERD, CKD Stage III, OSA on CPAP, CAD (cath 2009 with minimal CAD, myovue 2014 low risk), DM II, bladder cancer, colovesical fistula scheduled for above procedure 03/09/2020 with Dr. [name] and Dr. Louis Meckel.   Last seen by cardiology 09/22/2019, stable at this visit.  VS: BP 130/83   Pulse 78   Temp 36.7 C (Oral)   Resp 16   Ht 5\' 9"  (1.753 m)   Wt 95.3 kg   SpO2 100%   BMI 31.01 kg/m   PROVIDERS: Elby Showers, MD is PCP   [name], MD is Cardiologist   Susette Racer, MD is Endocrinologist last seen 01/11/2020, meds adjusted at this visit LABS: Labs reviewed: Acceptable for surgery. (all labs ordered are listed, but only abnormal results are displayed)  Labs Reviewed  HEMOGLOBIN A1C - Abnormal; Notable for the following components:      Result Value   Hgb A1c MFr Bld 9.2 (*)    All other components within normal limits  BASIC METABOLIC PANEL - Abnormal; Notable for the following components:   Glucose, Bld 168 (*)    BUN 26 (*)    Creatinine, Ser 2.07 (*)    Calcium 8.8 (*)    GFR, Estimated 33 (*)    All other components within normal limits  CBC - Abnormal; Notable for the following components:   Platelets 122 (*)    All other components within normal limits  GLUCOSE, CAPILLARY - Abnormal; Notable for the following components:   Glucose-Capillary 153 (*)    All other components within normal limits  TYPE AND SCREEN     IMAGES:   EKG: 09/22/2019 Rate 68 bpm   PACs  CV: Echo 02/03/2017 Study Conclusions   - Left ventricle: The cavity size was normal. Wall thickness was  increased in a pattern of mild LVH. Systolic function was normal.  The estimated ejection fraction was in the range of 60% to 65%.  Wall motion was normal; there were no regional wall motion  abnormalities. Doppler parameters are consistent with abnormal  left ventricular relaxation (grade 1 diastolic dysfunction).  GLS:-18.4%  Past Medical History:  Diagnosis Date  . Arthritis   . Bladder cancer [facility]) urologist-- dr Louis Meckel   11/ 2019 recurrent   . Cancer of renal pelvis, right [facility])    oncologist-  dr [name]--  high grade papillary urothelial carcinoma, Stage III (T3 Nx M0) -- 02-12-2017  s/p  left nephrectomy--- completed chemotherapy 07-27-2017  . Cataract   . Chemotherapy-induced neuropathy (HCC)    feet  . CKD (chronic kidney disease) stage 3, GFR 30-59 ml/min (HCC)   . Coronary artery disease    followed by Dr. [name]; Lexiscan Myoview (10/14):  Low risk, EF 57%, small inf infarct  . Coronary vasospasm ([place]) PER DR ROSS NOTE 03-22-2012--  INTER. TIGHTNESS   PER PT PROBABLE FROM STRESS  . Depression    no rx  .  Dyslipidemia    pt unable to tolerate niaspan  . GAD (generalized anxiety disorder)   . GERD (gastroesophageal reflux disease)   . Hearing loss    per pt due to chemotherapy  . History of angina CHRONIC -- CONTROLLED W/ IMDUR  . History of cancer chemotherapy    completed 07-27-2017  for renal carcinoma  . 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 History of non-ST elevation myocardial infarction (NSTEMI) 2005  . Hyperlipidemia   . Hypertension   . Myocardial infarction ([place]) 2005  . Nocturia more than twice per night   . OSA on CPAP   . Pneumonia   . Sleep apnea   . Solitary right kidney    s/p left nephrectomy 02-12-2017  . Type 2 diabetes mellitus treated with insulin [facility])     endocrinologist-- dr Steffanie Dunn ([facility] in [place])  lov note in epic   . Urgency of urination   . Wears glasses   . Wisdom teeth extracted     Past Surgical History:  Procedure Laterality Date  . APPENDECTOMY  08-03-2004  . CARDIAC CATHETERIZATION  x3  last one 06-17-2007   MILD NON-OBSTRUCTIVE CAD/ NORMAL LVF/ 30% LEFT RENAL ARTERY STENOSIS  . CARDIOVASCULAR STRESS TEST  02/23/2013   Low risk nuclear study w/ small inferior wall infarct at mid and basal level with no ischemia/  normal LV function and wall motion , ef 57%  . CYSTO/ BILATERAL RETROGRADE PYELOGRAM/ RIGHT URETEROSCOPIC LASER FULGURATION URETERAL TUMOR  02-16-2008  . CYSTO/ BLADDER BX  09-13-2008;  08-13-2007; 04-19-2007; 06-01-2006  . CYSTO/ CYSTOGRAM/ URETEROSCOPY  02-21-2009  . CYSTO/ RESECTION BLADDER TUMOR/ LEFT RETROGRADE PYELOGRAM/ RIGHT URETEROSCOPY  08-07-2010   TCC OF BLADDER   S/P DISTAL URETERECTOMY/ REIMPLANTATION  . CYSTO/ RIGHT URETEROSCOPY/ BX URETERAL TUMOR  10-11-2008  . CYSTOSCOPY W/ RETROGRADES  04/19/2012   Procedure: CYSTOSCOPY WITH RETROGRADE PYELOGRAM;  Surgeon: Bernestine Amass, MD;  Location[facility];  Service: Urology;  Laterality: Bilateral;  30 MINS Cysto, Biopsy, possible TURBT, Bilateral retrograde pyelograms with Mitomycin C instilation Post op   . CYSTOSCOPY W/ RETROGRADES Left 03/25/2018   Procedure: CYSTOSCOPY WITH RETROGRADE PYELOGRAM;  Surgeon: [name], MD;  Location: [facility];  Service: Urology;  Laterality: Left;  . CYSTOSCOPY WITH BIOPSY  04/07/2011   Procedure: CYSTOSCOPY WITH BIOPSY/ RIGHT URETEROSCOPY;  Surgeon: Bernestine Amass, MD;  Location: [facility];  Service: Urology;  Laterality: N/A;    cystoscopy, cystogram, biopsy and fulgeration  . CYSTOSCOPY WITH BIOPSY  12/29/2011   Procedure: CYSTOSCOPY WITH BIOPSY;  Surgeon: Bernestine Amass, MD;  Location: [facility];  Service: Urology;  Laterality: N/A;  [place]    . CYSTOSCOPY WITH RETROGRADE PYELOGRAM, URETEROSCOPY AND STENT PLACEMENT Left 04/28/2018   Procedure: LEFT RETROGRADE PYELOGRAM, DIAGNOSTIC LEFT URETEROSCOPY;  Surgeon: [name], MD;  [facility];  Service: Urology;  Laterality: Left;  . ELBOW SURGERY  07-07-2003   RIGHT ELBOW ARTHROSCOPY W/ OPEN RECONSTRUCTION  . IR FLUORO GUIDE PORT INSERTION RIGHT  04/01/2017  . IR US GUIDE VASC ACCESS RIGHT  04/01/2017  . KNEE ARTHROSCOPY W/ DEBRIDEMENT  02-08-2004   RIGHT KNEE  . RHINOPLASTY W/ MODIFIED CARTILAGE GRAFT  05-17-2007   INTERNAL NASAL VALVE COLLAPSE/ OSA/ SEPTAL PERFERATION  . RIGHT DISTAL URETERECTOMY W/ REIMPLANTATION  10-30-2008   TCC OF RIGHT DISTAL URETER  . ROBOT ASSITED LAPAROSCOPIC  NEPHROURETERECTOMY Right 02/12/2017   Procedure: XI ROBOT ASSITED LAPAROSCOPIC NEPHROURETERECTOMY;  Surgeon: [name], MD;  [facility];  Service: Urology;  Laterality: Right;  . SHOULDER SURGERY  2005   RIGHT ROTATOR CUFF REPAIR  . TOTAL KNEE ARTHROPLASTY Left 05/18/2015  . TOTAL KNEE ARTHROPLASTY Left 05/18/2015   Procedure: TOTAL KNEE ARTHROPLASTY;  Surgeon: Earlie Server, MD;  Location: [place];  Service: Orthopedics;  Laterality: Left;  . TOTAL KNEE ARTHROPLASTY Right 03/21/2016   Procedure: TOTAL KNEE ARTHROPLASTY;  Surgeon: Earlie Server, MD;  Location: [place];  Service: Orthopedics;  Laterality: Right;  . TRANSTHORACIC ECHOCARDIOGRAM  02/03/2017   ef 40-34%, grade 1 diastolic dysfunction/  trivial PR  . TRANSURETHRAL RESECTION OF BLADDER TUMOR  01-13-2007  . TRANSURETHRAL RESECTION OF BLADDER TUMOR  04/19/2012   Procedure: TRANSURETHRAL RESECTION OF BLADDER TUMOR (TURBT);  Surgeon: Bernestine Amass, MD;  Location[facility];  Service: Urology;  Laterality: N/A;  . TRANSURETHRAL RESECTION OF BLADDER TUMOR N/A 03/25/2018   Procedure: TRANSURETHRAL RESECTION OF BLADDER TUMOR (TURBT);  Surgeon: [name], MD;  Location:  [facility];  Service: Urology;  Laterality: N/A;  . TRANSURETHRAL RESECTION OF BLADDER TUMOR N/A 04/28/2018   Procedure: TRANSURETHRAL RESECTION OF BLADDER TUMOR (TURBT);  Surgeon: [name], MD;  [facility];  Service: Urology;  Laterality: N/A;  . umbillical hernia repair  2004    MEDICATIONS: . acetaminophen (TYLENOL) 500 MG tablet  . ALPRAZolam (XANAX) 1 MG tablet  . aspirin EC 81 MG tablet  . ciclopirox (PENLAC) 8 % solution  . ciprofloxacin (CIPRO) 250 MG tablet  . diazepam (VALIUM) 10 MG tablet  . finasteride (PROSCAR) 5 MG tablet  . fluconazole (DIFLUCAN) 200 MG tablet  . glimepiride (AMARYL) 2 MG tablet  . Insulin Glargine, 1 Unit Dial, (TOUJEO SOLOSTAR) 300 UNIT/ML SOPN  . isosorbide mononitrate (IMDUR) 120 MG 24 hr tablet  . ketoconazole (NIZORAL) 2 % cream  . liraglutide (VICTOZA) 18 MG/3ML SOPN  . metoprolol succinate (TOPROL-XL) 25 MG 24 hr tablet  . nitroGLYCERIN (NITROSTAT) 0.4 MG SL tablet  . ondansetron (ZOFRAN) 4 MG tablet  . pantoprazole (PROTONIX) 40 MG tablet  . polyethylene glycol (MIRALAX / GLYCOLAX) 17 g packet  . PRESCRIPTION MEDICATION  . rOPINIRole (REQUIP) 2 MG tablet  . rosuvastatin (CRESTOR) 10 MG tablet  . tamsulosin (FLOMAX) 0.4 MG CAPS capsule   No current facility-administered medications for this encounter.   Marland Kitchen LORazepam (ATIVAN) injection 0.5 mg  . sodium chloride flush (NS) 0.9 % injection 10 mL     Konrad Felix, PA-C [facility] [phone]

## 2020-03-01 NOTE — Progress Notes (Signed)
Agree surgery needs to be done

## 2020-03-01 NOTE — Progress Notes (Signed)
A1c sent to Dr. Johney Maine to review.

## 2020-03-01 NOTE — Progress Notes (Signed)
Agree surgery needs to be done.

## 2020-03-02 ENCOUNTER — Other Ambulatory Visit (HOSPITAL_COMMUNITY): Payer: Medicare Other

## 2020-03-05 ENCOUNTER — Inpatient Hospital Stay: Payer: Medicare Other

## 2020-03-05 ENCOUNTER — Encounter: Payer: Medicare Other | Admitting: Gastroenterology

## 2020-03-05 ENCOUNTER — Telehealth: Payer: Self-pay | Admitting: Family

## 2020-03-05 ENCOUNTER — Inpatient Hospital Stay: Payer: Medicare Other | Attending: Hematology & Oncology

## 2020-03-05 ENCOUNTER — Other Ambulatory Visit: Payer: Self-pay

## 2020-03-05 ENCOUNTER — Encounter: Payer: Self-pay | Admitting: Family

## 2020-03-05 ENCOUNTER — Inpatient Hospital Stay (HOSPITAL_BASED_OUTPATIENT_CLINIC_OR_DEPARTMENT_OTHER): Payer: Medicare Other | Admitting: Family

## 2020-03-05 VITALS — BP 138/93 | HR 74 | Temp 98.2°F | Resp 16

## 2020-03-05 DIAGNOSIS — C651 Malignant neoplasm of right renal pelvis: Secondary | ICD-10-CM

## 2020-03-05 DIAGNOSIS — K5792 Diverticulitis of intestine, part unspecified, without perforation or abscess without bleeding: Secondary | ICD-10-CM | POA: Diagnosis not present

## 2020-03-05 DIAGNOSIS — Z95828 Presence of other vascular implants and grafts: Secondary | ICD-10-CM

## 2020-03-05 DIAGNOSIS — G629 Polyneuropathy, unspecified: Secondary | ICD-10-CM | POA: Insufficient documentation

## 2020-03-05 DIAGNOSIS — Z7982 Long term (current) use of aspirin: Secondary | ICD-10-CM | POA: Diagnosis not present

## 2020-03-05 DIAGNOSIS — C670 Malignant neoplasm of trigone of bladder: Secondary | ICD-10-CM

## 2020-03-05 DIAGNOSIS — Z452 Encounter for adjustment and management of vascular access device: Secondary | ICD-10-CM | POA: Diagnosis not present

## 2020-03-05 DIAGNOSIS — Z905 Acquired absence of kidney: Secondary | ICD-10-CM | POA: Diagnosis not present

## 2020-03-05 DIAGNOSIS — Z79899 Other long term (current) drug therapy: Secondary | ICD-10-CM | POA: Insufficient documentation

## 2020-03-05 DIAGNOSIS — Z8551 Personal history of malignant neoplasm of bladder: Secondary | ICD-10-CM | POA: Insufficient documentation

## 2020-03-05 LAB — CBC WITH DIFFERENTIAL (CANCER CENTER ONLY)
Abs Immature Granulocytes: 0.01 10*3/uL (ref 0.00–0.07)
Basophils Absolute: 0 10*3/uL (ref 0.0–0.1)
Basophils Relative: 1 %
Eosinophils Absolute: 0.1 10*3/uL (ref 0.0–0.5)
Eosinophils Relative: 4 %
HCT: 38.9 % — ABNORMAL LOW (ref 39.0–52.0)
Hemoglobin: 13.1 g/dL (ref 13.0–17.0)
Immature Granulocytes: 0 %
Lymphocytes Relative: 37 %
Lymphs Abs: 1.2 10*3/uL (ref 0.7–4.0)
MCH: 31.2 pg (ref 26.0–34.0)
MCHC: 33.7 g/dL (ref 30.0–36.0)
MCV: 92.6 fL (ref 80.0–100.0)
Monocytes Absolute: 0.4 10*3/uL (ref 0.1–1.0)
Monocytes Relative: 13 %
Neutro Abs: 1.5 10*3/uL — ABNORMAL LOW (ref 1.7–7.7)
Neutrophils Relative %: 45 %
Platelet Count: 98 10*3/uL — ABNORMAL LOW (ref 150–400)
RBC: 4.2 MIL/uL — ABNORMAL LOW (ref 4.22–5.81)
RDW: 13.1 % (ref 11.5–15.5)
WBC Count: 3.4 10*3/uL — ABNORMAL LOW (ref 4.0–10.5)
nRBC: 0 % (ref 0.0–0.2)

## 2020-03-05 LAB — CMP (CANCER CENTER ONLY)
ALT: 21 U/L (ref 0–44)
AST: 15 U/L (ref 15–41)
Albumin: 3.8 g/dL (ref 3.5–5.0)
Alkaline Phosphatase: 41 U/L (ref 38–126)
Anion gap: 7 (ref 5–15)
BUN: 27 mg/dL — ABNORMAL HIGH (ref 8–23)
CO2: 26 mmol/L (ref 22–32)
Calcium: 9.4 mg/dL (ref 8.9–10.3)
Chloride: 99 mmol/L (ref 98–111)
Creatinine: 1.79 mg/dL — ABNORMAL HIGH (ref 0.61–1.24)
GFR, Estimated: 42 mL/min — ABNORMAL LOW (ref >60)
Glucose, Bld: 342 mg/dL — ABNORMAL HIGH (ref 70–99)
Potassium: 4.2 mmol/L (ref 3.5–5.1)
Sodium: 132 mmol/L — ABNORMAL LOW (ref 135–145)
Total Bilirubin: 0.5 mg/dL (ref 0.3–1.2)
Total Protein: 6.4 g/dL — ABNORMAL LOW (ref 6.5–8.1)

## 2020-03-05 LAB — LACTATE DEHYDROGENASE: LDH: 225 U/L — ABNORMAL HIGH (ref 98–192)

## 2020-03-05 LAB — PLATELET BY CITRATE

## 2020-03-05 LAB — SAVE SMEAR(SSMR), FOR PROVIDER SLIDE REVIEW

## 2020-03-05 MED ORDER — SODIUM CHLORIDE 0.9% FLUSH
10.0000 mL | INTRAVENOUS | Status: DC | PRN
  Administered 2020-03-05: 10 mL via INTRAVENOUS
  Filled 2020-03-05: qty 10

## 2020-03-05 MED ORDER — HEPARIN SOD (PORK) LOCK FLUSH 100 UNIT/ML IV SOLN
500.0000 [IU] | Freq: Once | INTRAVENOUS | Status: AC
  Administered 2020-03-05: 500 [IU] via INTRAVENOUS
  Filled 2020-03-05: qty 5

## 2020-03-05 NOTE — Progress Notes (Signed)
Hematology and Oncology Follow Up Visit  Isaac [name] [id number] 1955-10-13 64 y.o. 03/05/2020   Principle Diagnosis:  Stage III (T3NxM0)Infiltrating high grade papillary urothelial carcinomaof the RIGHT renal hilum -right nephroureterectomy on 02/12/2017  Past Therapy:        Cisplatin/Gemcitabine s/pcycle6 - completed on 07/27/2017  Current Therapy: Observation   Interim History:  Isaac [name] is here today for follow-up. He states that he is having issues with diverticulitis and what sounds like a fistula leaking stool from the intestine into his bladder.  He is scheduled for colonoscopy this Thursday with Dr. [name] and then surgery on Friday with DR. Gross. He is is having right lower quadrant pain which he states if from the diverticulitis as well as buning and pain on urination.  He denies blood loss. No abnormal bruising, no petechiae.  No fever, chills, n/v, cough, rash, dizziness, SOB, chest pain, palpitations or changes in bowel habits.  He has an occasional headache. No blurred or loss of vision.  He has puffiness that comes and goes in his ankles. No redness or edema noted on exam. Pedal pulses are 2+.  The neuropathy in his toes seems a bit more intense. He was going to [facility] in [place] for alternative treatment for this but had to put on hold due to his intestinal issues.  No falls or syncopal episodes to report.  He has maintained a good appetite and is staying well hydrated. His weight is down 8 lbs since August.  He states that his blood sugars are still poorly controlled. 342 today.   ECOG Performance Status: 1 - Symptomatic but completely ambulatory  Medications:  Allergies as of 03/05/2020      Reactions   Hydrocodone Itching, Nausea And Vomiting   Dilaudid [hydromorphone] Nausea And Vomiting   Nsaids    Has 1 kidney left      Medication List       Accurate as of March 05, 2020  9:20 AM. If you have any questions, ask your nurse  or doctor.        acetaminophen 500 MG tablet Commonly known as: TYLENOL Take 500 mg by mouth every 6 (six) hours as needed for moderate pain or headache.   ALPRAZolam 1 MG tablet Commonly known as: XANAX Take 1 tablet (1 mg total) by mouth 3 (three) times daily as needed for anxiety.   aspirin EC 81 MG tablet Take 81 mg by mouth at bedtime.   ciclopirox 8 % solution Commonly known as: PENLAC Apply 1 application topically at bedtime as needed (toenail fungus). APPLY ON NAIL AND SURROUNDING SKIN OVER PREVIOUS COAT. AFTER 7 DAYS REMOVE WITH ALCOHOL AND REPEAT CYCLE.   ciprofloxacin 250 MG tablet Commonly known as: CIPRO Take 250 mg by mouth 2 (two) times daily.   diazepam 10 MG tablet Commonly known as: VALIUM 10 mg. Insert 10 mg rectally at night as needed bowel relaxation   finasteride 5 MG tablet Commonly known as: PROSCAR Take 5 mg by mouth daily.   fluconazole 200 MG tablet Commonly known as: DIFLUCAN Take 200 mg by mouth daily.   glimepiride 2 MG tablet Commonly known as: AMARYL TAKE 1 BY MOUTH DAILY BEFORE SUPPER What changed:   how much to take  how to take this  when to take this  additional instructions   isosorbide mononitrate 120 MG 24 hr tablet Commonly known as: IMDUR TAKE 1 TABLET BY MOUTH DAILY What changed:   how much to take  how  to take this  when to take this  additional instructions   ketoconazole 2 % cream Commonly known as: NIZORAL Apply 1 application topically daily.   metoprolol succinate 25 MG 24 hr tablet Commonly known as: TOPROL-XL TAKE 1 TABLET BY MOUTH AT BEDTIME. GENERIC EQUIVALENT FOR TOPROL-XL What changed:   how much to take  how to take this  when to take this  additional instructions   nitroGLYCERIN 0.4 MG SL tablet Commonly known as: NITROSTAT Place 1 tablet (0.4 mg total) under the tongue every 5 (five) minutes as needed for chest pain. Repeat every 5 minute up to 3 doses if no relief call 911.    ondansetron 4 MG tablet Commonly known as: ZOFRAN Take 4 mg by mouth every 8 (eight) hours as needed for nausea/vomiting.   pantoprazole 40 MG tablet Commonly known as: PROTONIX Take 1 tablet (40 mg total) by mouth every morning.   Plenvu 140 g Solr Generic drug: PEG-KCl-NaCl-NaSulf-Na Asc-C SMARTSIG:1 Kit(s) By Mouth Once   polyethylene glycol 17 g packet Commonly known as: MIRALAX / GLYCOLAX Take 17 g by mouth daily as needed for moderate constipation.   PRESCRIPTION MEDICATION Inhale into the lungs at bedtime. CPAP   rOPINIRole 2 MG tablet Commonly known as: REQUIP TAKE 1 TABLET BY MOUTH AT BEDTIME   rosuvastatin 10 MG tablet Commonly known as: CRESTOR TAKE 1 TAB (10 MG) BY MOUTH EVERY OTHER DAY What changed:   how much to take  how to take this  when to take this  additional instructions   tamsulosin 0.4 MG Caps capsule Commonly known as: FLOMAX Take 0.4 mg by mouth daily.   Toujeo SoloStar 300 UNIT/ML Solostar Pen Generic drug: insulin glargine (1 Unit Dial) Inject 40 Units into the skin 2 (two) times daily.   Victoza 18 MG/3ML Sopn Generic drug: liraglutide Inject 0.6 mg into the skin daily.       Allergies:  Allergies  Allergen Reactions   Hydrocodone Itching and Nausea And Vomiting   Dilaudid [Hydromorphone] Nausea And Vomiting   Nsaids     Has 1 kidney left    Past Medical History, Surgical history, Social history, and Family History were reviewed and updated.  Review of Systems: All other 10 point review of systems is negative.   Physical Exam:  vitals were not taken for this visit.   Wt Readings from Last 3 Encounters:  02/29/20 210 lb (95.3 kg)  02/29/20 210 lb (95.3 kg)  12/19/19 218 lb (98.9 kg)    Ocular: Sclerae unicteric, pupils equal, round and reactive to light Ear-nose-throat: Oropharynx clear, dentition fair Lymphatic: No cervical or supraclavicular adenopathy Lungs no rales or rhonchi, good excursion  bilaterally Heart regular rate and rhythm, no murmur appreciated Abd soft, right lower quadrant tenderness, positive bowel sounds MSK no focal spinal tenderness, no joint edema Neuro: non-focal, well-oriented, appropriate affect Breasts: Deferred   Lab Results  Component Value Date   WBC 4.7 02/29/2020   HGB 15.2 02/29/2020   HCT 45.1 02/29/2020   MCV 93.4 02/29/2020   PLT 122 (L) 02/29/2020   Lab Results  Component Value Date   FERRITIN 52 07/06/2018   IRON 98 07/06/2018   TIBC 371 07/06/2018   UIBC 273 07/06/2018   IRONPCTSAT 27 07/06/2018   Lab Results  Component Value Date   RETICCTPCT 2.2 (H) 10/15/2017   RBC 4.83 02/29/2020   No results found for: Nils Pyle [facility] Lab Results  Component Value Date   IGA 232 01/25/2015  No results found for: Kathrynn Ducking, MSPIKE, SPEI   Chemistry      Component Value Date/Time   NA 135 02/29/2020 0950   NA 134 05/04/2017 0925   K 4.3 02/29/2020 0950   K 4.5 05/04/2017 0925   CL 102 02/29/2020 0950   CL 97 (L) 05/04/2017 0925   CO2 23 02/29/2020 0950   CO2 23 05/04/2017 0925   BUN 26 (H) 02/29/2020 0950   BUN 28 (H) 05/04/2017 0925   CREATININE 2.07 (H) 02/29/2020 0950   CREATININE 1.83 (H) 12/19/2019 0955   CREATININE 1.95 (H) 03/07/2019 1038   GLU 86 03/31/2016 0000      Component Value Date/Time   CALCIUM 8.8 (L) 02/29/2020 0950   CALCIUM 9.3 05/04/2017 0925   ALKPHOS 43 12/19/2019 0955   ALKPHOS 58 05/04/2017 0925   AST 20 12/19/2019 0955   ALT 31 12/19/2019 0955   ALT 90 (H) 05/04/2017 0925   BILITOT 0.6 12/19/2019 0955       Impression and Plan: Isaac [name] is a very pleasant 64 yo caucasian gentleman with infiltrating high grade papillary urothelial carcinoma with right nephrectomy in October 2018 and adjuvant chemotherapy.  He is now having some serious complication with his diverticulitis. His scheduled for colonoscopy and surgery at  [facility] at the end of this week.  We will hold off on doing another PET scan for now per Dr. [name].  We will see him again in 2 months and can schedule his scan at that time.  He was encouraged to contact our office with any questions or concerns.   Laverna Peace, NP 10/25/20219:20 AM

## 2020-03-05 NOTE — Telephone Encounter (Signed)
Appointments scheudled calendar printed per 10/25 los

## 2020-03-05 NOTE — Patient Instructions (Signed)
Implanted Port Insertion, Care After °This sheet gives you information about how to care for yourself after your procedure. Your health care provider may also give you more specific instructions. If you have problems or questions, contact your health care provider. °What can I expect after the procedure? °After the procedure, it is common to have: °· Discomfort at the port insertion site. °· Bruising on the skin over the port. This should improve over 3-4 days. °Follow these instructions at home: °Port care °· After your port is placed, you will get a manufacturer's information card. The card has information about your port. Keep this card with you at all times. °· Take care of the port as told by your health care provider. Ask your health care provider if you or a family member can get training for taking care of the port at home. A home health care nurse may also take care of the port. °· Make sure to remember what type of port you have. °Incision care ° °  ° °· Follow instructions from your health care provider about how to take care of your port insertion site. Make sure you: °? Wash your hands with soap and water before and after you change your bandage (dressing). If soap and water are not available, use hand sanitizer. °? Change your dressing as told by your health care provider. °? Leave stitches (sutures), skin glue, or adhesive strips in place. These skin closures may need to stay in place for 2 weeks or longer. If adhesive strip edges start to loosen and curl up, you may trim the loose edges. Do not remove adhesive strips completely unless your health care provider tells you to do that. °· Check your port insertion site every day for signs of infection. Check for: °? Redness, swelling, or pain. °? Fluid or blood. °? Warmth. °? Pus or a bad smell. °Activity °· Return to your normal activities as told by your health care provider. Ask your health care provider what activities are safe for you. °· Do not  lift anything that is heavier than 10 lb (4.5 kg), or the limit that you are told, until your health care provider says that it is safe. °General instructions °· Take over-the-counter and prescription medicines only as told by your health care provider. °· Do not take baths, swim, or use a hot tub until your health care provider approves. Ask your health care provider if you may take showers. You may only be allowed to take sponge baths. °· Do not drive for 24 hours if you were given a sedative during your procedure. °· Wear a medical alert bracelet in case of an emergency. This will tell any health care providers that you have a port. °· Keep all follow-up visits as told by your health care provider. This is important. °Contact a health care provider if: °· You cannot flush your port with saline as directed, or you cannot draw blood from the port. °· You have a fever or chills. °· You have redness, swelling, or pain around your port insertion site. °· You have fluid or blood coming from your port insertion site. °· Your port insertion site feels warm to the touch. °· You have pus or a bad smell coming from the port insertion site. °Get help right away if: °· You have chest pain or shortness of breath. °· You have bleeding from your port that you cannot control. °Summary °· Take care of the port as told by your health   care provider. Keep the manufacturer's information card with you at all times. °· Change your dressing as told by your health care provider. °· Contact a health care provider if you have a fever or chills or if you have redness, swelling, or pain around your port insertion site. °· Keep all follow-up visits as told by your health care provider. °This information is not intended to replace advice given to you by your health care provider. Make sure you discuss any questions you have with your health care provider. °Document Revised: 11/24/2017 Document Reviewed: 11/24/2017 °Elsevier Patient Education ©  2020 [employer] ° °

## 2020-03-06 ENCOUNTER — Other Ambulatory Visit (HOSPITAL_COMMUNITY)
Admission: RE | Admit: 2020-03-06 | Discharge: 2020-03-06 | Disposition: A | Discharge status: Home / Self Care | Payer: Medicare Other | Source: Ambulatory Visit | Attending: Surgery | Admitting: Surgery

## 2020-03-06 DIAGNOSIS — Z1211 Encounter for screening for malignant neoplasm of colon: Secondary | ICD-10-CM | POA: Diagnosis not present

## 2020-03-06 DIAGNOSIS — N183 Chronic kidney disease, stage 3 unspecified: Secondary | ICD-10-CM | POA: Diagnosis present

## 2020-03-06 DIAGNOSIS — C651 Malignant neoplasm of right renal pelvis: Secondary | ICD-10-CM | POA: Diagnosis not present

## 2020-03-06 DIAGNOSIS — C678 Malignant neoplasm of overlapping sites of bladder: Secondary | ICD-10-CM | POA: Diagnosis not present

## 2020-03-06 DIAGNOSIS — D127 Benign neoplasm of rectosigmoid junction: Secondary | ICD-10-CM | POA: Diagnosis not present

## 2020-03-06 DIAGNOSIS — I25119 Atherosclerotic heart disease of native coronary artery with unspecified angina pectoris: Secondary | ICD-10-CM | POA: Diagnosis not present

## 2020-03-06 DIAGNOSIS — F411 Generalized anxiety disorder: Secondary | ICD-10-CM | POA: Diagnosis not present

## 2020-03-06 DIAGNOSIS — M109 Gout, unspecified: Secondary | ICD-10-CM | POA: Diagnosis not present

## 2020-03-06 DIAGNOSIS — I10 Essential (primary) hypertension: Secondary | ICD-10-CM | POA: Diagnosis not present

## 2020-03-06 DIAGNOSIS — K5730 Diverticulosis of large intestine without perforation or abscess without bleeding: Secondary | ICD-10-CM | POA: Diagnosis not present

## 2020-03-06 DIAGNOSIS — R350 Frequency of micturition: Secondary | ICD-10-CM | POA: Diagnosis not present

## 2020-03-06 DIAGNOSIS — Z794 Long term (current) use of insulin: Secondary | ICD-10-CM | POA: Diagnosis not present

## 2020-03-06 DIAGNOSIS — Z9221 Personal history of antineoplastic chemotherapy: Secondary | ICD-10-CM | POA: Diagnosis not present

## 2020-03-06 DIAGNOSIS — R531 Weakness: Secondary | ICD-10-CM | POA: Diagnosis not present

## 2020-03-06 DIAGNOSIS — N321 Vesicointestinal fistula: Secondary | ICD-10-CM | POA: Diagnosis not present

## 2020-03-06 DIAGNOSIS — Z905 Acquired absence of kidney: Secondary | ICD-10-CM | POA: Diagnosis not present

## 2020-03-06 DIAGNOSIS — G2581 Restless legs syndrome: Secondary | ICD-10-CM | POA: Diagnosis not present

## 2020-03-06 DIAGNOSIS — Z20822 Contact with and (suspected) exposure to covid-19: Secondary | ICD-10-CM | POA: Diagnosis not present

## 2020-03-06 DIAGNOSIS — Z79899 Other long term (current) drug therapy: Secondary | ICD-10-CM | POA: Diagnosis not present

## 2020-03-06 DIAGNOSIS — K660 Peritoneal adhesions (postprocedural) (postinfection): Secondary | ICD-10-CM | POA: Diagnosis present

## 2020-03-06 DIAGNOSIS — D123 Benign neoplasm of transverse colon: Secondary | ICD-10-CM | POA: Diagnosis not present

## 2020-03-06 DIAGNOSIS — Z01812 Encounter for preprocedural laboratory examination: Secondary | ICD-10-CM | POA: Insufficient documentation

## 2020-03-06 DIAGNOSIS — I252 Old myocardial infarction: Secondary | ICD-10-CM | POA: Diagnosis not present

## 2020-03-06 DIAGNOSIS — E119 Type 2 diabetes mellitus without complications: Secondary | ICD-10-CM | POA: Diagnosis not present

## 2020-03-06 DIAGNOSIS — D125 Benign neoplasm of sigmoid colon: Secondary | ICD-10-CM | POA: Diagnosis not present

## 2020-03-06 DIAGNOSIS — Z7982 Long term (current) use of aspirin: Secondary | ICD-10-CM | POA: Diagnosis not present

## 2020-03-06 DIAGNOSIS — K632 Fistula of intestine: Secondary | ICD-10-CM | POA: Diagnosis not present

## 2020-03-06 DIAGNOSIS — K5720 Diverticulitis of large intestine with perforation and abscess without bleeding: Secondary | ICD-10-CM | POA: Diagnosis not present

## 2020-03-06 DIAGNOSIS — K219 Gastro-esophageal reflux disease without esophagitis: Secondary | ICD-10-CM | POA: Diagnosis not present

## 2020-03-06 DIAGNOSIS — K640 First degree hemorrhoids: Secondary | ICD-10-CM | POA: Diagnosis not present

## 2020-03-06 DIAGNOSIS — E785 Hyperlipidemia, unspecified: Secondary | ICD-10-CM | POA: Diagnosis not present

## 2020-03-06 DIAGNOSIS — I2510 Atherosclerotic heart disease of native coronary artery without angina pectoris: Secondary | ICD-10-CM | POA: Diagnosis not present

## 2020-03-06 DIAGNOSIS — I129 Hypertensive chronic kidney disease with stage 1 through stage 4 chronic kidney disease, or unspecified chronic kidney disease: Secondary | ICD-10-CM | POA: Diagnosis present

## 2020-03-06 DIAGNOSIS — R1031 Right lower quadrant pain: Secondary | ICD-10-CM | POA: Diagnosis present

## 2020-03-06 DIAGNOSIS — N401 Enlarged prostate with lower urinary tract symptoms: Secondary | ICD-10-CM | POA: Diagnosis not present

## 2020-03-06 DIAGNOSIS — Z408 Encounter for other prophylactic surgery: Secondary | ICD-10-CM | POA: Diagnosis not present

## 2020-03-06 DIAGNOSIS — Z87891 Personal history of nicotine dependence: Secondary | ICD-10-CM | POA: Diagnosis not present

## 2020-03-06 DIAGNOSIS — D128 Benign neoplasm of rectum: Secondary | ICD-10-CM | POA: Diagnosis not present

## 2020-03-06 DIAGNOSIS — Z96653 Presence of artificial knee joint, bilateral: Secondary | ICD-10-CM | POA: Diagnosis present

## 2020-03-06 DIAGNOSIS — E1122 Type 2 diabetes mellitus with diabetic chronic kidney disease: Secondary | ICD-10-CM | POA: Diagnosis not present

## 2020-03-06 DIAGNOSIS — K760 Fatty (change of) liver, not elsewhere classified: Secondary | ICD-10-CM | POA: Diagnosis not present

## 2020-03-06 DIAGNOSIS — D124 Benign neoplasm of descending colon: Secondary | ICD-10-CM | POA: Diagnosis not present

## 2020-03-06 LAB — SARS CORONAVIRUS 2 (TAT 6-24 HRS): SARS Coronavirus 2: NEGATIVE

## 2020-03-08 ENCOUNTER — Ambulatory Visit (AMBULATORY_SURGERY_CENTER): Payer: Medicare Other | Admitting: Gastroenterology

## 2020-03-08 ENCOUNTER — Encounter: Payer: Self-pay | Admitting: Gastroenterology

## 2020-03-08 ENCOUNTER — Other Ambulatory Visit: Payer: Self-pay

## 2020-03-08 VITALS — BP 110/74 | HR 60 | Temp 97.5°F | Resp 13 | Ht 69.0 in | Wt 210.0 lb

## 2020-03-08 DIAGNOSIS — D125 Benign neoplasm of sigmoid colon: Secondary | ICD-10-CM | POA: Diagnosis not present

## 2020-03-08 DIAGNOSIS — D123 Benign neoplasm of transverse colon: Secondary | ICD-10-CM

## 2020-03-08 DIAGNOSIS — E119 Type 2 diabetes mellitus without complications: Secondary | ICD-10-CM | POA: Diagnosis not present

## 2020-03-08 DIAGNOSIS — Z1211 Encounter for screening for malignant neoplasm of colon: Secondary | ICD-10-CM | POA: Diagnosis not present

## 2020-03-08 DIAGNOSIS — K632 Fistula of intestine: Secondary | ICD-10-CM

## 2020-03-08 DIAGNOSIS — I252 Old myocardial infarction: Secondary | ICD-10-CM | POA: Diagnosis not present

## 2020-03-08 DIAGNOSIS — D124 Benign neoplasm of descending colon: Secondary | ICD-10-CM | POA: Diagnosis not present

## 2020-03-08 DIAGNOSIS — D122 Benign neoplasm of ascending colon: Secondary | ICD-10-CM

## 2020-03-08 DIAGNOSIS — Z86010 Personal history of colonic polyps: Secondary | ICD-10-CM

## 2020-03-08 DIAGNOSIS — K5730 Diverticulosis of large intestine without perforation or abscess without bleeding: Secondary | ICD-10-CM

## 2020-03-08 DIAGNOSIS — K640 First degree hemorrhoids: Secondary | ICD-10-CM | POA: Diagnosis not present

## 2020-03-08 DIAGNOSIS — I2510 Atherosclerotic heart disease of native coronary artery without angina pectoris: Secondary | ICD-10-CM | POA: Diagnosis not present

## 2020-03-08 DIAGNOSIS — D128 Benign neoplasm of rectum: Secondary | ICD-10-CM

## 2020-03-08 HISTORY — PX: COLONOSCOPY W/ POLYPECTOMY: SHX1380

## 2020-03-08 MED ORDER — SODIUM CHLORIDE 0.9 % IV SOLN: 500.0000 mL | Freq: Once | INTRAVENOUS | Status: DC

## 2020-03-08 NOTE — Patient Instructions (Signed)
CLEAR LIQUIDS DIET UNTIL AFTER SURGERY TOMORROW OR AS DIRECTED BY DR GROSS   HANDOUTS ON POLYPS ,DIVERTICULOSIS / DIVERTICULITIS ,& HEMORRHOIDS  GIVEN TO YOU TODAY   AWAIT PATHOLOGY RESULTS ON POLYPS REMOVED    COLONOSCOPY REPORT GIVEN TO YOU TODAY AS WELL   YOU HAD AN ENDOSCOPIC PROCEDURE TODAY AT [place]:   Refer to the procedure report that was given to you for any specific questions about what was found during the examination.  If the procedure report does not answer your questions, please call your gastroenterologist to clarify.  If you requested that your care partner not be given the details of your procedure findings, then the procedure report has been included in a sealed envelope for you to review at your convenience later.  YOU SHOULD EXPECT: Some feelings of bloating in the abdomen. Passage of more gas than usual.  Walking can help get rid of the air that was put into your GI tract during the procedure and reduce the bloating. If you had a lower endoscopy (such as a colonoscopy or flexible sigmoidoscopy) you may notice spotting of blood in your stool or on the toilet paper. If you underwent a bowel prep for your procedure, you may not have a normal bowel movement for a few days.  Please Note:  You might notice some irritation and congestion in your nose or some drainage.  This is from the oxygen used during your procedure.  There is no need for concern and it should clear up in a day or so.  SYMPTOMS TO REPORT IMMEDIATELY:   Following lower endoscopy (colonoscopy or flexible sigmoidoscopy):  Excessive amounts of blood in the stool  Significant tenderness or worsening of abdominal pains  Swelling of the abdomen that is new, acute  Fever of 100F or higher    For urgent or emergent issues, a gastroenterologist can be reached at any hour by calling [phone]. Do not use MyChart messaging for urgent concerns.    DIET:  We do recommend a small  meal at first, but then you may proceed to your regular diet.  Drink plenty of fluids but you should avoid alcoholic beverages for 24 hours.  ACTIVITY:  You should plan to take it easy for the rest of today and you should NOT DRIVE or use heavy machinery until tomorrow (because of the sedation medicines used during the test).    FOLLOW UP: Our staff will call the number listed on your records 48-72 hours following your procedure to check on you and address any questions or concerns that you may have regarding the information given to you following your procedure. If we do not reach you, we will leave a message.  We will attempt to reach you two times.  During this call, we will ask if you have developed any symptoms of COVID 19. If you develop any symptoms (ie: fever, flu-like symptoms, shortness of breath, cough etc.) before then, please call [phone].  If you test positive for Covid 19 in the 2 weeks post procedure, please call and report this information to [place].    If any biopsies were taken you will be contacted by phone or by letter within the next 1-3 weeks.  Please call us at [phone] if you have not heard about the biopsies in 3 weeks.    SIGNATURES/CONFIDENTIALITY: You and/or your care partner have signed paperwork which will be entered into your electronic medical record.  These signatures attest to the  fact that that the information above on your After Visit Summary has been reviewed and is understood.  Full responsibility of the confidentiality of this discharge information lies with you and/or your care-partner.

## 2020-03-08 NOTE — Progress Notes (Signed)
Pt's states no medical or surgical changes since previsit or office visit. 

## 2020-03-08 NOTE — Op Note (Signed)
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                            Personal history of adenomatous colon polyps. Medicines:                Monitored Anesthesia Care Procedure:                Pre-Anesthesia Assessment:                           - Prior to the procedure, a History and Physical                            was performed, and patient medications and                            allergies were reviewed. The patient's tolerance of                            previous anesthesia was also reviewed. The risks                            and benefits of the procedure and the sedation                            options and risks were discussed with the patient.                            All questions were answered, and informed consent                            was obtained. Prior Anticoagulants: The patient has                            taken no previous anticoagulant or antiplatelet                            agents. ASA Grade Assessment: III - A patient with                            severe systemic disease. After reviewing the risks                            and benefits, the patient was deemed in                            satisfactory condition to undergo the procedure.  After obtaining informed consent, the colonoscope                            was passed under direct vision. Throughout the                            procedure, the patient's blood pressure, pulse, and                            oxygen saturations were monitored continuously. The                            Colonoscope was introduced through the anus and                            advanced  to the the cecum, identified by                            appendiceal orifice and ileocecal valve. The                            ileocecal valve, appendiceal orifice, and rectum                            were photographed. The quality of the bowel                            preparation was good. The colonoscopy was performed                            without difficulty. The patient tolerated the                            procedure well. Scope In: 10:51:01 AM Scope Out: 57:32:20 AM Scope Withdrawal Time: 0 hours 18 minutes 33 seconds  Total Procedure Duration: 0 hours 20 minutes 17 seconds  Findings:                 The perianal and digital rectal examinations were                            normal.                           Nine sessile polyps were found in the rectum (1),                            sigmoid colon (2), descending colon (3) and                            transverse colon (3). The polyps were 5 to 7 mm in                            size. These polyps were removed with a cold snare.  Resection and retrieval were complete.                           Multiple medium-mouthed diverticula were found in                            the left colon. There was evidence of diverticular                            spasm. Peri-diverticular erythema was seen. There                            was no evidence of diverticular bleeding. Focal                            ulcer, edema, erythema at 25 cm, possible fistula.                           Internal hemorrhoids were found during                            retroflexion. The hemorrhoids were small and Grade                            I (internal hemorrhoids that do not prolapse).                           The exam was otherwise without abnormality on                            direct and retroflexion views. Complications:            No immediate complications. Estimated blood loss:                             None. Estimated Blood Loss:     Estimated blood loss: none. Impression:               - Nine 5 to 7 mm polyps in the rectum, in the                            sigmoid colon, in the descending colon and in the                            transverse colon, removed with a cold snare.                            Resected and retrieved.                           - Severe diverticulosis in the left colon. There                            was evidence of diverticular spasm.  Peri-diverticular erythema was seen. There was no                            evidence of diverticular bleeding. Focal ulcer,                            edema, erythema at 25 cm, possible fistula.                           - Internal hemorrhoids.                           - The examination was otherwise normal on direct                            and retroflexion views. Recommendation:           - Repeat colonoscopy, likely 3 years, after studies                            are complete for surveillance based on pathology                            results.                           - Patient has a contact number available for                            emergencies. The signs and symptoms of potential                            delayed complications were discussed with the                            patient. Return to normal activities tomorrow.                            Written discharge instructions were provided to the                            patient.                           - Clear liquid diet until surgery tomorrow or as                            directed by Dr. Johney Maine.                           - Continue present medications.                           - Await pathology results. [name], MD 03/08/2020 11:21:17 AM This report has been signed electronically.

## 2020-03-08 NOTE — Progress Notes (Signed)
Report given to PACU, vss 

## 2020-03-08 NOTE — Progress Notes (Signed)
Called to room to assist during endoscopic procedure.  Patient ID and intended procedure confirmed with present staff. Received instructions for my participation in the procedure from the performing physician.  

## 2020-03-09 ENCOUNTER — Inpatient Hospital Stay (HOSPITAL_COMMUNITY): Payer: Medicare Other | Admitting: Physician Assistant

## 2020-03-09 ENCOUNTER — Inpatient Hospital Stay (HOSPITAL_COMMUNITY)
Admission: RE | Admit: 2020-03-09 | Discharge: 2020-03-12 | DRG: 330 | Disposition: A | Discharge status: Home Health | Payer: Medicare Other | Attending: Surgery | Admitting: Surgery

## 2020-03-09 ENCOUNTER — Inpatient Hospital Stay (HOSPITAL_COMMUNITY): Payer: Medicare Other | Admitting: Certified Registered Nurse Anesthetist

## 2020-03-09 ENCOUNTER — Other Ambulatory Visit: Payer: Self-pay

## 2020-03-09 ENCOUNTER — Encounter (HOSPITAL_COMMUNITY): Payer: Self-pay | Admitting: Surgery

## 2020-03-09 ENCOUNTER — Encounter (HOSPITAL_COMMUNITY)
Admission: RE | Disposition: A | Discharge status: Home Health | Payer: Self-pay | Source: Home / Self Care | Attending: Surgery

## 2020-03-09 DIAGNOSIS — C678 Malignant neoplasm of overlapping sites of bladder: Secondary | ICD-10-CM | POA: Diagnosis present

## 2020-03-09 DIAGNOSIS — E1122 Type 2 diabetes mellitus with diabetic chronic kidney disease: Secondary | ICD-10-CM | POA: Diagnosis present

## 2020-03-09 DIAGNOSIS — M109 Gout, unspecified: Secondary | ICD-10-CM | POA: Diagnosis present

## 2020-03-09 DIAGNOSIS — K660 Peritoneal adhesions (postprocedural) (postinfection): Secondary | ICD-10-CM | POA: Diagnosis present

## 2020-03-09 DIAGNOSIS — Z905 Acquired absence of kidney: Secondary | ICD-10-CM | POA: Diagnosis not present

## 2020-03-09 DIAGNOSIS — I214 Non-ST elevation (NSTEMI) myocardial infarction: Secondary | ICD-10-CM | POA: Diagnosis present

## 2020-03-09 DIAGNOSIS — Z794 Long term (current) use of insulin: Secondary | ICD-10-CM

## 2020-03-09 DIAGNOSIS — I10 Essential (primary) hypertension: Secondary | ICD-10-CM | POA: Diagnosis present

## 2020-03-09 DIAGNOSIS — Z96653 Presence of artificial knee joint, bilateral: Secondary | ICD-10-CM | POA: Diagnosis present

## 2020-03-09 DIAGNOSIS — E785 Hyperlipidemia, unspecified: Secondary | ICD-10-CM | POA: Diagnosis present

## 2020-03-09 DIAGNOSIS — Z87891 Personal history of nicotine dependence: Secondary | ICD-10-CM

## 2020-03-09 DIAGNOSIS — R1031 Right lower quadrant pain: Secondary | ICD-10-CM | POA: Diagnosis present

## 2020-03-09 DIAGNOSIS — F32A Depression, unspecified: Secondary | ICD-10-CM | POA: Diagnosis present

## 2020-03-09 DIAGNOSIS — I129 Hypertensive chronic kidney disease with stage 1 through stage 4 chronic kidney disease, or unspecified chronic kidney disease: Secondary | ICD-10-CM | POA: Diagnosis present

## 2020-03-09 DIAGNOSIS — K219 Gastro-esophageal reflux disease without esophagitis: Secondary | ICD-10-CM | POA: Diagnosis present

## 2020-03-09 DIAGNOSIS — Z9989 Dependence on other enabling machines and devices: Secondary | ICD-10-CM

## 2020-03-09 DIAGNOSIS — G629 Polyneuropathy, unspecified: Secondary | ICD-10-CM

## 2020-03-09 DIAGNOSIS — T451X5A Adverse effect of antineoplastic and immunosuppressive drugs, initial encounter: Secondary | ICD-10-CM

## 2020-03-09 DIAGNOSIS — C679 Malignant neoplasm of bladder, unspecified: Secondary | ICD-10-CM | POA: Diagnosis present

## 2020-03-09 DIAGNOSIS — F411 Generalized anxiety disorder: Secondary | ICD-10-CM | POA: Diagnosis present

## 2020-03-09 DIAGNOSIS — N321 Vesicointestinal fistula: Secondary | ICD-10-CM | POA: Diagnosis present

## 2020-03-09 DIAGNOSIS — N401 Enlarged prostate with lower urinary tract symptoms: Secondary | ICD-10-CM | POA: Diagnosis present

## 2020-03-09 DIAGNOSIS — Z7982 Long term (current) use of aspirin: Secondary | ICD-10-CM

## 2020-03-09 DIAGNOSIS — G2581 Restless legs syndrome: Secondary | ICD-10-CM | POA: Diagnosis present

## 2020-03-09 DIAGNOSIS — I252 Old myocardial infarction: Secondary | ICD-10-CM

## 2020-03-09 DIAGNOSIS — F419 Anxiety disorder, unspecified: Secondary | ICD-10-CM | POA: Diagnosis present

## 2020-03-09 DIAGNOSIS — E119 Type 2 diabetes mellitus without complications: Secondary | ICD-10-CM

## 2020-03-09 DIAGNOSIS — N183 Chronic kidney disease, stage 3 unspecified: Secondary | ICD-10-CM | POA: Diagnosis present

## 2020-03-09 DIAGNOSIS — K760 Fatty (change of) liver, not elsewhere classified: Secondary | ICD-10-CM | POA: Diagnosis present

## 2020-03-09 DIAGNOSIS — K5900 Constipation, unspecified: Secondary | ICD-10-CM | POA: Diagnosis present

## 2020-03-09 DIAGNOSIS — D509 Iron deficiency anemia, unspecified: Secondary | ICD-10-CM | POA: Diagnosis present

## 2020-03-09 DIAGNOSIS — Z20822 Contact with and (suspected) exposure to covid-19: Secondary | ICD-10-CM | POA: Diagnosis present

## 2020-03-09 DIAGNOSIS — K5720 Diverticulitis of large intestine with perforation and abscess without bleeding: Secondary | ICD-10-CM | POA: Diagnosis present

## 2020-03-09 DIAGNOSIS — Z8554 Personal history of malignant neoplasm of ureter: Secondary | ICD-10-CM

## 2020-03-09 DIAGNOSIS — Z79899 Other long term (current) drug therapy: Secondary | ICD-10-CM

## 2020-03-09 DIAGNOSIS — R350 Frequency of micturition: Secondary | ICD-10-CM | POA: Diagnosis present

## 2020-03-09 DIAGNOSIS — Z9221 Personal history of antineoplastic chemotherapy: Secondary | ICD-10-CM | POA: Diagnosis not present

## 2020-03-09 DIAGNOSIS — E1142 Type 2 diabetes mellitus with diabetic polyneuropathy: Secondary | ICD-10-CM | POA: Diagnosis present

## 2020-03-09 DIAGNOSIS — E7800 Pure hypercholesterolemia, unspecified: Secondary | ICD-10-CM | POA: Diagnosis present

## 2020-03-09 DIAGNOSIS — G4733 Obstructive sleep apnea (adult) (pediatric): Secondary | ICD-10-CM

## 2020-03-09 HISTORY — PX: CYSTOSCOPY WITH STENT PLACEMENT: SHX5790

## 2020-03-09 HISTORY — PX: PANENDOSCOPY: SHX2159

## 2020-03-09 HISTORY — PX: OSTOMY: SHX5997

## 2020-03-09 LAB — GLUCOSE, CAPILLARY
Glucose-Capillary: 87 mg/dL (ref 70–99)
Glucose-Capillary: 140 mg/dL — ABNORMAL HIGH (ref 70–99)
Glucose-Capillary: 162 mg/dL — ABNORMAL HIGH (ref 70–99)

## 2020-03-09 LAB — TYPE AND SCREEN
ABO/RH(D): A POS
Antibody Screen: NEGATIVE

## 2020-03-09 SURGERY — COLECTOMY, SIGMOID, ROBOT-ASSISTED
Anesthesia: General | Site: Abdomen

## 2020-03-09 MED ORDER — SODIUM CHLORIDE 0.9 % IV SOLN: Freq: Three times a day (TID) | INTRAVENOUS | Status: AC | PRN

## 2020-03-09 MED ORDER — ACETAMINOPHEN 500 MG PO TABS: 500.0000 mg | ORAL_TABLET | Freq: Four times a day (QID) | ORAL | Status: DC | PRN

## 2020-03-09 MED ORDER — SODIUM CHLORIDE 0.9 % IV SOLN
2.0000 g | Freq: Two times a day (BID) | INTRAVENOUS | Status: AC
  Administered 2020-03-10: 2 g via INTRAVENOUS
  Filled 2020-03-09: qty 2

## 2020-03-09 MED ORDER — SODIUM CHLORIDE 0.9% FLUSH
10.0000 mL | INTRAVENOUS | Status: DC | PRN
  Administered 2020-03-12: 10 mL

## 2020-03-09 MED ORDER — SODIUM CHLORIDE 0.9 % IV SOLN
2.0000 g | INTRAVENOUS | Status: AC
  Administered 2020-03-09: 2 g via INTRAVENOUS
  Filled 2020-03-09: qty 2

## 2020-03-09 MED ORDER — METHOCARBAMOL 1000 MG/10ML IJ SOLN
1000.0000 mg | Freq: Four times a day (QID) | INTRAVENOUS | Status: DC | PRN
  Filled 2020-03-09: qty 10

## 2020-03-09 MED ORDER — FENTANYL CITRATE (PF) 100 MCG/2ML IJ SOLN
25.0000 ug | INTRAMUSCULAR | Status: DC | PRN
  Administered 2020-03-09: 50 ug via INTRAVENOUS

## 2020-03-09 MED ORDER — ENALAPRILAT 1.25 MG/ML IV SOLN
0.6250 mg | Freq: Four times a day (QID) | INTRAVENOUS | Status: DC | PRN
  Filled 2020-03-09: qty 1

## 2020-03-09 MED ORDER — MIDAZOLAM HCL 5 MG/5ML IJ SOLN
INTRAMUSCULAR | Status: DC | PRN
  Administered 2020-03-09: 2 mg via INTRAVENOUS

## 2020-03-09 MED ORDER — INSULIN GLARGINE 100 UNIT/ML ~~LOC~~ SOLN
40.0000 [IU] | Freq: Two times a day (BID) | SUBCUTANEOUS | Status: DC
  Administered 2020-03-09 – 2020-03-12 (×6): 40 [IU] via SUBCUTANEOUS
  Filled 2020-03-09 (×6): qty 0.4

## 2020-03-09 MED ORDER — FENTANYL CITRATE (PF) 100 MCG/2ML IJ SOLN
INTRAMUSCULAR | Status: AC
  Filled 2020-03-09: qty 2

## 2020-03-09 MED ORDER — SODIUM CHLORIDE 0.9 % IR SOLN
Status: DC | PRN
  Administered 2020-03-09: 1000 mL

## 2020-03-09 MED ORDER — GABAPENTIN 300 MG PO CAPS
300.0000 mg | ORAL_CAPSULE | ORAL | Status: AC
  Administered 2020-03-09: 300 mg via ORAL
  Filled 2020-03-09: qty 1

## 2020-03-09 MED ORDER — PHENYLEPHRINE 40 MCG/ML (10ML) SYRINGE FOR IV PUSH (FOR BLOOD PRESSURE SUPPORT)
PREFILLED_SYRINGE | INTRAVENOUS | Status: AC
  Filled 2020-03-09: qty 10

## 2020-03-09 MED ORDER — AMISULPRIDE (ANTIEMETIC) 5 MG/2ML IV SOLN: 10.0000 mg | Freq: Once | INTRAVENOUS | Status: DC | PRN

## 2020-03-09 MED ORDER — PROPOFOL 10 MG/ML IV BOLUS
INTRAVENOUS | Status: DC | PRN
  Administered 2020-03-09: 180 mg via INTRAVENOUS

## 2020-03-09 MED ORDER — ONDANSETRON HCL 4 MG PO TABS: 4.0000 mg | ORAL_TABLET | Freq: Four times a day (QID) | ORAL | Status: DC | PRN

## 2020-03-09 MED ORDER — LACTATED RINGERS IR SOLN
Status: DC | PRN
  Administered 2020-03-09 (×3): 1000 mL

## 2020-03-09 MED ORDER — STERILE WATER FOR INJECTION IJ SOLN
INTRAMUSCULAR | Status: DC | PRN
  Administered 2020-03-09: 7.5 mL via URETERAL

## 2020-03-09 MED ORDER — 0.9 % SODIUM CHLORIDE (POUR BTL) OPTIME
TOPICAL | Status: DC | PRN
  Administered 2020-03-09: 2000 mL

## 2020-03-09 MED ORDER — NEOMYCIN SULFATE 500 MG PO TABS: 1000.0000 mg | ORAL_TABLET | ORAL | Status: DC

## 2020-03-09 MED ORDER — LIP MEDEX EX OINT
1.0000 "application " | TOPICAL_OINTMENT | Freq: Two times a day (BID) | CUTANEOUS | Status: DC
  Administered 2020-03-10 – 2020-03-12 (×4): 1 via TOPICAL
  Filled 2020-03-09 (×3): qty 7

## 2020-03-09 MED ORDER — PHENYLEPHRINE HCL (PRESSORS) 10 MG/ML IV SOLN
INTRAVENOUS | Status: DC | PRN
  Administered 2020-03-09 (×5): 80 ug via INTRAVENOUS

## 2020-03-09 MED ORDER — LIDOCAINE 2% (20 MG/ML) 5 ML SYRINGE
INTRAMUSCULAR | Status: DC | PRN
  Administered 2020-03-09: 1.5 mg/kg/h via INTRAVENOUS

## 2020-03-09 MED ORDER — KETAMINE HCL 10 MG/ML IJ SOLN
INTRAMUSCULAR | Status: DC | PRN
  Administered 2020-03-09: 50 mg via INTRAVENOUS

## 2020-03-09 MED ORDER — METHOCARBAMOL 500 MG PO TABS
1000.0000 mg | ORAL_TABLET | Freq: Four times a day (QID) | ORAL | Status: DC | PRN
  Administered 2020-03-09 – 2020-03-11 (×3): 1000 mg via ORAL
  Filled 2020-03-09 (×3): qty 2

## 2020-03-09 MED ORDER — DEXAMETHASONE SODIUM PHOSPHATE 10 MG/ML IJ SOLN
INTRAMUSCULAR | Status: DC | PRN
  Administered 2020-03-09: 10 mg via INTRAVENOUS

## 2020-03-09 MED ORDER — INDOCYANINE GREEN 25 MG IV SOLR
INTRAVENOUS | Status: DC | PRN
  Administered 2020-03-09: 10 mg via INTRAVENOUS

## 2020-03-09 MED ORDER — ROPINIROLE HCL 1 MG PO TABS
2.0000 mg | ORAL_TABLET | Freq: Every day | ORAL | Status: DC
  Administered 2020-03-09 – 2020-03-11 (×3): 2 mg via ORAL
  Filled 2020-03-09 (×3): qty 2

## 2020-03-09 MED ORDER — ROSUVASTATIN CALCIUM 10 MG PO TABS
10.0000 mg | ORAL_TABLET | ORAL | Status: DC
  Administered 2020-03-10 – 2020-03-12 (×2): 10 mg via ORAL
  Filled 2020-03-09 (×2): qty 1

## 2020-03-09 MED ORDER — STERILE WATER FOR INJECTION IJ SOLN
INTRAMUSCULAR | Status: AC
  Filled 2020-03-09: qty 10

## 2020-03-09 MED ORDER — ROCURONIUM BROMIDE 100 MG/10ML IV SOLN
INTRAVENOUS | Status: DC | PRN
  Administered 2020-03-09: 80 mg via INTRAVENOUS
  Administered 2020-03-09 (×2): 20 mg via INTRAVENOUS

## 2020-03-09 MED ORDER — SODIUM CHLORIDE (PF) 0.9 % IJ SOLN
INTRAMUSCULAR | Status: AC
  Filled 2020-03-09: qty 20

## 2020-03-09 MED ORDER — ENSURE PRE-SURGERY PO LIQD
592.0000 mL | Freq: Once | ORAL | Status: DC
  Filled 2020-03-09: qty 592

## 2020-03-09 MED ORDER — CICLOPIROX 8 % EX SOLN: 1.0000 "application " | Freq: Every evening | CUTANEOUS | Status: DC | PRN

## 2020-03-09 MED ORDER — PROCHLORPERAZINE MALEATE 10 MG PO TABS: 10.0000 mg | ORAL_TABLET | Freq: Four times a day (QID) | ORAL | Status: DC | PRN

## 2020-03-09 MED ORDER — CHLORHEXIDINE GLUCONATE CLOTH 2 % EX PADS
6.0000 | MEDICATED_PAD | Freq: Every day | CUTANEOUS | Status: DC
  Administered 2020-03-09 – 2020-03-12 (×4): 6 via TOPICAL

## 2020-03-09 MED ORDER — INSULIN ASPART 100 UNIT/ML ~~LOC~~ SOLN
0.0000 [IU] | Freq: Every day | SUBCUTANEOUS | Status: DC
  Administered 2020-03-10: 3 [IU] via SUBCUTANEOUS
  Administered 2020-03-11: 2 [IU] via SUBCUTANEOUS

## 2020-03-09 MED ORDER — ENOXAPARIN SODIUM 40 MG/0.4ML ~~LOC~~ SOLN
40.0000 mg | SUBCUTANEOUS | Status: DC
  Administered 2020-03-10 – 2020-03-12 (×3): 40 mg via SUBCUTANEOUS
  Filled 2020-03-09 (×3): qty 0.4

## 2020-03-09 MED ORDER — FINASTERIDE 5 MG PO TABS
5.0000 mg | ORAL_TABLET | Freq: Every day | ORAL | Status: DC
  Administered 2020-03-09 – 2020-03-12 (×4): 5 mg via ORAL
  Filled 2020-03-09 (×4): qty 1

## 2020-03-09 MED ORDER — ALPRAZOLAM 1 MG PO TABS
1.0000 mg | ORAL_TABLET | Freq: Three times a day (TID) | ORAL | Status: DC | PRN
  Administered 2020-03-09 – 2020-03-11 (×3): 1 mg via ORAL
  Filled 2020-03-09 (×3): qty 1

## 2020-03-09 MED ORDER — ALVIMOPAN 12 MG PO CAPS
12.0000 mg | ORAL_CAPSULE | ORAL | Status: AC
  Administered 2020-03-09: 12 mg via ORAL
  Filled 2020-03-09: qty 1

## 2020-03-09 MED ORDER — EPHEDRINE 5 MG/ML INJ
INTRAVENOUS | Status: AC
  Filled 2020-03-09: qty 10

## 2020-03-09 MED ORDER — SODIUM CHLORIDE 0.9% FLUSH
10.0000 mL | Freq: Two times a day (BID) | INTRAVENOUS | Status: DC
  Administered 2020-03-09 – 2020-03-11 (×4): 10 mL

## 2020-03-09 MED ORDER — CIPROFLOXACIN HCL 250 MG PO TABS
250.0000 mg | ORAL_TABLET | Freq: Two times a day (BID) | ORAL | Status: DC
  Administered 2020-03-09 – 2020-03-12 (×6): 250 mg via ORAL
  Filled 2020-03-09 (×6): qty 1

## 2020-03-09 MED ORDER — METOPROLOL TARTRATE 5 MG/5ML IV SOLN: 5.0000 mg | Freq: Four times a day (QID) | INTRAVENOUS | Status: DC | PRN

## 2020-03-09 MED ORDER — NAPHAZOLINE-GLYCERIN 0.012-0.2 % OP SOLN
1.0000 [drp] | Freq: Four times a day (QID) | OPHTHALMIC | Status: DC | PRN
  Filled 2020-03-09: qty 15

## 2020-03-09 MED ORDER — GABAPENTIN 100 MG PO CAPS
200.0000 mg | ORAL_CAPSULE | Freq: Three times a day (TID) | ORAL | Status: DC
  Administered 2020-03-09 – 2020-03-12 (×8): 200 mg via ORAL
  Filled 2020-03-09 (×8): qty 2

## 2020-03-09 MED ORDER — PROPOFOL 10 MG/ML IV BOLUS
INTRAVENOUS | Status: AC
  Filled 2020-03-09: qty 20

## 2020-03-09 MED ORDER — ONDANSETRON HCL 4 MG/2ML IJ SOLN
INTRAMUSCULAR | Status: DC | PRN
  Administered 2020-03-09: 4 mg via INTRAVENOUS

## 2020-03-09 MED ORDER — CHLORHEXIDINE GLUCONATE 0.12 % MT SOLN
15.0000 mL | Freq: Once | OROMUCOSAL | Status: AC
  Administered 2020-03-09: 15 mL via OROMUCOSAL

## 2020-03-09 MED ORDER — BUPIVACAINE-EPINEPHRINE (PF) 0.25% -1:200000 IJ SOLN
INTRAMUSCULAR | Status: DC | PRN
  Administered 2020-03-09: 60 mL

## 2020-03-09 MED ORDER — LIP MEDEX EX OINT
TOPICAL_OINTMENT | CUTANEOUS | Status: AC
  Administered 2020-03-09: 1 via TOPICAL
  Filled 2020-03-09: qty 7

## 2020-03-09 MED ORDER — ORAL CARE MOUTH RINSE: 15.0000 mL | Freq: Once | OROMUCOSAL | Status: AC

## 2020-03-09 MED ORDER — LACTATED RINGERS IV SOLN: INTRAVENOUS | Status: AC

## 2020-03-09 MED ORDER — LACTATED RINGERS IV SOLN: INTRAVENOUS | Status: DC | PRN

## 2020-03-09 MED ORDER — SUGAMMADEX SODIUM 200 MG/2ML IV SOLN
INTRAVENOUS | Status: DC | PRN
  Administered 2020-03-09: 200 mg via INTRAVENOUS

## 2020-03-09 MED ORDER — MIDAZOLAM HCL 2 MG/2ML IJ SOLN
INTRAMUSCULAR | Status: AC
  Filled 2020-03-09: qty 2

## 2020-03-09 MED ORDER — ISOSORBIDE MONONITRATE ER 60 MG PO TB24
120.0000 mg | ORAL_TABLET | Freq: Every day | ORAL | Status: DC
  Administered 2020-03-10 – 2020-03-12 (×3): 120 mg via ORAL
  Filled 2020-03-09 (×3): qty 2

## 2020-03-09 MED ORDER — POLYETHYLENE GLYCOL 3350 17 GM/SCOOP PO POWD: 1.0000 | Freq: Once | ORAL | Status: DC

## 2020-03-09 MED ORDER — DEXMEDETOMIDINE (PRECEDEX) IN NS 20 MCG/5ML (4 MCG/ML) IV SYRINGE
PREFILLED_SYRINGE | INTRAVENOUS | Status: AC
  Filled 2020-03-09: qty 5

## 2020-03-09 MED ORDER — ENSURE PRE-SURGERY PO LIQD
296.0000 mL | Freq: Once | ORAL | Status: DC
  Filled 2020-03-09: qty 296

## 2020-03-09 MED ORDER — EPHEDRINE SULFATE 50 MG/ML IJ SOLN
INTRAMUSCULAR | Status: DC | PRN
  Administered 2020-03-09: 5 mg via INTRAVENOUS
  Administered 2020-03-09 (×2): 10 mg via INTRAVENOUS
  Administered 2020-03-09: 5 mg via INTRAVENOUS

## 2020-03-09 MED ORDER — PANTOPRAZOLE SODIUM 40 MG PO TBEC
40.0000 mg | DELAYED_RELEASE_TABLET | Freq: Every morning | ORAL | Status: DC
  Administered 2020-03-10 – 2020-03-12 (×3): 40 mg via ORAL
  Filled 2020-03-09 (×3): qty 1

## 2020-03-09 MED ORDER — ACETAMINOPHEN 500 MG PO TABS
1000.0000 mg | ORAL_TABLET | Freq: Four times a day (QID) | ORAL | Status: DC
  Administered 2020-03-09 – 2020-03-12 (×11): 1000 mg via ORAL
  Filled 2020-03-09 (×12): qty 2

## 2020-03-09 MED ORDER — ACETAMINOPHEN 500 MG PO TABS
1000.0000 mg | ORAL_TABLET | ORAL | Status: AC
  Administered 2020-03-09: 1000 mg via ORAL
  Filled 2020-03-09: qty 2

## 2020-03-09 MED ORDER — ONDANSETRON HCL 4 MG/2ML IJ SOLN: 4.0000 mg | Freq: Once | INTRAMUSCULAR | Status: DC | PRN

## 2020-03-09 MED ORDER — METRONIDAZOLE 500 MG PO TABS: 1000.0000 mg | ORAL_TABLET | ORAL | Status: DC

## 2020-03-09 MED ORDER — DEXMEDETOMIDINE (PRECEDEX) IN NS 20 MCG/5ML (4 MCG/ML) IV SYRINGE
PREFILLED_SYRINGE | INTRAVENOUS | Status: DC | PRN
  Administered 2020-03-09: 12 ug via INTRAVENOUS
  Administered 2020-03-09: 8 ug via INTRAVENOUS

## 2020-03-09 MED ORDER — OXYCODONE HCL 5 MG/5ML PO SOLN: 5.0000 mg | Freq: Once | ORAL | Status: DC | PRN

## 2020-03-09 MED ORDER — BISACODYL 5 MG PO TBEC: 20.0000 mg | DELAYED_RELEASE_TABLET | Freq: Once | ORAL | Status: DC

## 2020-03-09 MED ORDER — ENSURE SURGERY PO LIQD
237.0000 mL | Freq: Two times a day (BID) | ORAL | Status: DC
  Administered 2020-03-10 – 2020-03-12 (×5): 237 mL via ORAL
  Filled 2020-03-09 (×6): qty 237

## 2020-03-09 MED ORDER — FLUCONAZOLE 100 MG PO TABS
200.0000 mg | ORAL_TABLET | Freq: Every day | ORAL | Status: DC
  Administered 2020-03-09 – 2020-03-12 (×4): 200 mg via ORAL
  Filled 2020-03-09 (×4): qty 2

## 2020-03-09 MED ORDER — DIPHENHYDRAMINE HCL 12.5 MG/5ML PO ELIX: 12.5000 mg | ORAL_SOLUTION | Freq: Four times a day (QID) | ORAL | Status: DC | PRN

## 2020-03-09 MED ORDER — LIDOCAINE HCL (CARDIAC) PF 100 MG/5ML IV SOSY
PREFILLED_SYRINGE | INTRAVENOUS | Status: DC | PRN
  Administered 2020-03-09: 100 mg via INTRAVENOUS

## 2020-03-09 MED ORDER — FENTANYL CITRATE (PF) 100 MCG/2ML IJ SOLN
INTRAMUSCULAR | Status: DC | PRN
  Administered 2020-03-09 (×2): 50 ug via INTRAVENOUS
  Administered 2020-03-09: 100 ug via INTRAVENOUS

## 2020-03-09 MED ORDER — ONDANSETRON HCL 4 MG/2ML IJ SOLN: 4.0000 mg | Freq: Four times a day (QID) | INTRAMUSCULAR | Status: DC | PRN

## 2020-03-09 MED ORDER — DIPHENHYDRAMINE HCL 50 MG/ML IJ SOLN: 12.5000 mg | Freq: Four times a day (QID) | INTRAMUSCULAR | Status: DC | PRN

## 2020-03-09 MED ORDER — INSULIN ASPART 100 UNIT/ML ~~LOC~~ SOLN
0.0000 [IU] | Freq: Three times a day (TID) | SUBCUTANEOUS | Status: DC
  Administered 2020-03-10 (×2): 5 [IU] via SUBCUTANEOUS
  Administered 2020-03-10 – 2020-03-11 (×2): 3 [IU] via SUBCUTANEOUS
  Administered 2020-03-11: 5 [IU] via SUBCUTANEOUS
  Administered 2020-03-12: 3 [IU] via SUBCUTANEOUS

## 2020-03-09 MED ORDER — METHYLENE BLUE 0.5 % INJ SOLN
INTRAVENOUS | Status: AC
  Filled 2020-03-09: qty 10

## 2020-03-09 MED ORDER — ASPIRIN EC 81 MG PO TBEC
81.0000 mg | DELAYED_RELEASE_TABLET | Freq: Every day | ORAL | Status: DC
  Administered 2020-03-09 – 2020-03-11 (×3): 81 mg via ORAL
  Filled 2020-03-09 (×3): qty 1

## 2020-03-09 MED ORDER — SUCCINYLCHOLINE CHLORIDE 200 MG/10ML IV SOSY
PREFILLED_SYRINGE | INTRAVENOUS | Status: AC
  Filled 2020-03-09: qty 10

## 2020-03-09 MED ORDER — ALUM & MAG HYDROXIDE-SIMETH 200-200-20 MG/5ML PO SUSP
30.0000 mL | Freq: Four times a day (QID) | ORAL | Status: DC | PRN
  Administered 2020-03-10 – 2020-03-11 (×2): 30 mL via ORAL
  Filled 2020-03-09 (×2): qty 30

## 2020-03-09 MED ORDER — SUCCINYLCHOLINE CHLORIDE 20 MG/ML IJ SOLN
INTRAMUSCULAR | Status: DC | PRN
  Administered 2020-03-09: 100 mg via INTRAVENOUS

## 2020-03-09 MED ORDER — BUPIVACAINE LIPOSOME 1.3 % IJ SUSP
20.0000 mL | Freq: Once | INTRAMUSCULAR | Status: DC
  Filled 2020-03-09: qty 20

## 2020-03-09 MED ORDER — BUPIVACAINE-EPINEPHRINE (PF) 0.25% -1:200000 IJ SOLN
INTRAMUSCULAR | Status: AC
  Filled 2020-03-09: qty 60

## 2020-03-09 MED ORDER — KETOCONAZOLE 2 % EX CREA
1.0000 "application " | TOPICAL_CREAM | Freq: Every day | CUTANEOUS | Status: DC
  Filled 2020-03-09: qty 15

## 2020-03-09 MED ORDER — ALVIMOPAN 12 MG PO CAPS
12.0000 mg | ORAL_CAPSULE | Freq: Two times a day (BID) | ORAL | Status: DC
  Administered 2020-03-10 – 2020-03-12 (×4): 12 mg via ORAL
  Filled 2020-03-09 (×5): qty 1

## 2020-03-09 MED ORDER — TRAMADOL HCL 50 MG PO TABS
50.0000 mg | ORAL_TABLET | Freq: Four times a day (QID) | ORAL | Status: DC | PRN
  Administered 2020-03-09 – 2020-03-12 (×8): 50 mg via ORAL
  Filled 2020-03-09 (×8): qty 1

## 2020-03-09 MED ORDER — MAGIC MOUTHWASH
15.0000 mL | Freq: Four times a day (QID) | ORAL | Status: DC | PRN
  Filled 2020-03-09: qty 15

## 2020-03-09 MED ORDER — LIRAGLUTIDE 18 MG/3ML ~~LOC~~ SOPN: 0.6000 mg | PEN_INJECTOR | Freq: Every day | SUBCUTANEOUS | Status: DC

## 2020-03-09 MED ORDER — METOCLOPRAMIDE HCL 5 MG/ML IJ SOLN: 5.0000 mg | Freq: Three times a day (TID) | INTRAMUSCULAR | Status: DC | PRN

## 2020-03-09 MED ORDER — METOPROLOL SUCCINATE ER 25 MG PO TB24
25.0000 mg | ORAL_TABLET | Freq: Every day | ORAL | Status: DC
  Administered 2020-03-09 – 2020-03-11 (×3): 25 mg via ORAL
  Filled 2020-03-09 (×3): qty 1

## 2020-03-09 MED ORDER — FENTANYL CITRATE (PF) 100 MCG/2ML IJ SOLN
12.5000 ug | INTRAMUSCULAR | Status: DC | PRN
  Administered 2020-03-09 – 2020-03-11 (×10): 25 ug via INTRAVENOUS
  Administered 2020-03-12: 12.5 ug via INTRAVENOUS
  Filled 2020-03-09 (×11): qty 2

## 2020-03-09 MED ORDER — GLIMEPIRIDE 2 MG PO TABS
2.0000 mg | ORAL_TABLET | Freq: Two times a day (BID) | ORAL | Status: DC
  Administered 2020-03-10 – 2020-03-12 (×5): 2 mg via ORAL
  Filled 2020-03-09 (×6): qty 1

## 2020-03-09 MED ORDER — PROCHLORPERAZINE EDISYLATE 10 MG/2ML IJ SOLN: 5.0000 mg | Freq: Four times a day (QID) | INTRAMUSCULAR | Status: DC | PRN

## 2020-03-09 MED ORDER — NITROGLYCERIN 0.4 MG SL SUBL: 0.4000 mg | SUBLINGUAL_TABLET | SUBLINGUAL | Status: DC | PRN

## 2020-03-09 MED ORDER — ENOXAPARIN SODIUM 40 MG/0.4ML ~~LOC~~ SOLN
40.0000 mg | Freq: Once | SUBCUTANEOUS | Status: AC
  Administered 2020-03-09: 40 mg via SUBCUTANEOUS
  Filled 2020-03-09: qty 0.4

## 2020-03-09 MED ORDER — BUPIVACAINE LIPOSOME 1.3 % IJ SUSP
INTRAMUSCULAR | Status: DC | PRN
  Administered 2020-03-09: 20 mL

## 2020-03-09 MED ORDER — OXYCODONE HCL 5 MG PO TABS: 5.0000 mg | ORAL_TABLET | Freq: Once | ORAL | Status: DC | PRN

## 2020-03-09 MED ORDER — TAMSULOSIN HCL 0.4 MG PO CAPS
0.4000 mg | ORAL_CAPSULE | Freq: Every day | ORAL | Status: DC
  Administered 2020-03-09 – 2020-03-12 (×4): 0.4 mg via ORAL
  Filled 2020-03-09 (×4): qty 1

## 2020-03-09 MED ORDER — LACTATED RINGERS IV SOLN: INTRAVENOUS | Status: DC

## 2020-03-09 SURGICAL SUPPLY — 113 items
APPLIER CLIP 5 13 M/L LIGAMAX5 (MISCELLANEOUS)
APPLIER CLIP ROT 10 11.4 M/L (STAPLE)
BAG URO CATCHER STRL LF (MISCELLANEOUS) ×3 IMPLANT
BLADE EXTENDED COATED 6.5IN (ELECTRODE) IMPLANT
CANNULA REDUC XI 12-8 STAPL (CANNULA)
CANNULA REDUCER 12-8 DVNC XI (CANNULA) IMPLANT
CATH URET 5FR 28IN OPEN ENDED (CATHETERS) ×3 IMPLANT
CELLS DAT CNTRL 66122 CELL SVR (MISCELLANEOUS) IMPLANT
CLIP APPLIE 5 13 M/L LIGAMAX5 (MISCELLANEOUS) IMPLANT
CLIP APPLIE ROT 10 11.4 M/L (STAPLE) IMPLANT
CLOTH BEACON ORANGE TIMEOUT ST (SAFETY) ×3 IMPLANT
COVER SURGICAL LIGHT HANDLE (MISCELLANEOUS) ×6 IMPLANT
COVER TIP SHEARS 8 DVNC (MISCELLANEOUS) ×2 IMPLANT
COVER TIP SHEARS 8MM DA VINCI (MISCELLANEOUS) ×3
COVER WAND RF STERILE (DRAPES) ×3 IMPLANT
DECANTER SPIKE VIAL GLASS SM (MISCELLANEOUS) ×6 IMPLANT
DEVICE TROCAR PUNCTURE CLOSURE (ENDOMECHANICALS) IMPLANT
DRAIN CHANNEL 19F RND (DRAIN) IMPLANT
DRAPE ARM DVNC X/XI (DISPOSABLE) ×8 IMPLANT
DRAPE COLUMN DVNC XI (DISPOSABLE) ×2 IMPLANT
DRAPE DA VINCI XI ARM (DISPOSABLE) ×12
DRAPE DA VINCI XI COLUMN (DISPOSABLE) ×3
DRAPE SURG IRRIG POUCH 19X23 (DRAPES) ×3 IMPLANT
DRSG OPSITE POSTOP 4X10 (GAUZE/BANDAGES/DRESSINGS) IMPLANT
DRSG OPSITE POSTOP 4X6 (GAUZE/BANDAGES/DRESSINGS) ×3 IMPLANT
DRSG OPSITE POSTOP 4X8 (GAUZE/BANDAGES/DRESSINGS) IMPLANT
DRSG TEGADERM 2-3/8X2-3/4 SM (GAUZE/BANDAGES/DRESSINGS) ×12 IMPLANT
DRSG TEGADERM 4X4.75 (GAUZE/BANDAGES/DRESSINGS) ×3 IMPLANT
ELECT PENCIL ROCKER SW 15FT (MISCELLANEOUS) ×3 IMPLANT
ELECT REM PT RETURN 15FT ADLT (MISCELLANEOUS) ×3 IMPLANT
ENDOLOOP SUT PDS II  0 18 (SUTURE)
ENDOLOOP SUT PDS II 0 18 (SUTURE) IMPLANT
EVACUATOR SILICONE 100CC (DRAIN) IMPLANT
GAUZE SPONGE 2X2 8PLY STRL LF (GAUZE/BANDAGES/DRESSINGS) ×2 IMPLANT
GLOVE BIO SURGEON STRL SZ 6 (GLOVE) ×6 IMPLANT
GLOVE BIOGEL M STRL SZ7.5 (GLOVE) ×3 IMPLANT
GLOVE BIOGEL PI IND STRL 6.5 (GLOVE) ×4 IMPLANT
GLOVE BIOGEL PI INDICATOR 6.5 (GLOVE) ×2
GLOVE ECLIPSE 8.0 STRL XLNG CF (GLOVE) ×9 IMPLANT
GLOVE INDICATOR 8.0 STRL GRN (GLOVE) ×9 IMPLANT
GOWN STRL REUS W/TWL LRG LVL3 (GOWN DISPOSABLE) ×6 IMPLANT
GOWN STRL REUS W/TWL XL LVL3 (GOWN DISPOSABLE) ×15 IMPLANT
GRASPER SUT TROCAR 14GX15 (MISCELLANEOUS) IMPLANT
GUIDEWIRE STR DUAL SENSOR (WIRE) IMPLANT
HOLDER FOLEY CATH W/STRAP (MISCELLANEOUS) ×3 IMPLANT
IRRIG SUCT STRYKERFLOW 2 WTIP (MISCELLANEOUS) ×3
IRRIGATION SUCT STRKRFLW 2 WTP (MISCELLANEOUS) ×2 IMPLANT
KIT PROCEDURE DA VINCI SI (MISCELLANEOUS) ×3
KIT PROCEDURE DVNC SI (MISCELLANEOUS) ×2 IMPLANT
KIT SIGMOIDOSCOPE (SET/KITS/TRAYS/PACK) ×3 IMPLANT
KIT TURNOVER KIT A (KITS) ×3 IMPLANT
MANIFOLD NEPTUNE II (INSTRUMENTS) ×3 IMPLANT
NEEDLE INSUFFLATION 14GA 120MM (NEEDLE) ×3 IMPLANT
PACK CARDIOVASCULAR III (CUSTOM PROCEDURE TRAY) ×3 IMPLANT
PACK COLON (CUSTOM PROCEDURE TRAY) ×3 IMPLANT
PACK CYSTO (CUSTOM PROCEDURE TRAY) ×3 IMPLANT
PAD POSITIONING PINK XL (MISCELLANEOUS) ×3 IMPLANT
PLUG CATH AND CAP STER (CATHETERS) ×3 IMPLANT
PORT LAP GEL ALEXIS MED 5-9CM (MISCELLANEOUS) ×3 IMPLANT
PROTECTOR NERVE ULNAR (MISCELLANEOUS) ×6 IMPLANT
RELOAD STAPLER 3.5X45 BLU DVNC (STAPLE) IMPLANT
RELOAD STAPLER 4.3X45 GRN DVNC (STAPLE) IMPLANT
RELOAD STAPLER 4.3X60 GRN DVNC (STAPLE) ×4 IMPLANT
RTRCTR WOUND ALEXIS 18CM MED (MISCELLANEOUS)
SCISSORS LAP 5X35 DISP (ENDOMECHANICALS) ×3 IMPLANT
SEAL CANN UNIV 5-8 DVNC XI (MISCELLANEOUS) ×8 IMPLANT
SEAL XI 5MM-8MM UNIVERSAL (MISCELLANEOUS) ×12
SEALER VESSEL DA VINCI XI (MISCELLANEOUS) ×3
SEALER VESSEL EXT DVNC XI (MISCELLANEOUS) ×2 IMPLANT
SET IRRIG Y TYPE TUR BLADDER L (SET/KITS/TRAYS/PACK) ×3 IMPLANT
SOLUTION ELECTROLUBE (MISCELLANEOUS) ×3 IMPLANT
SPONGE GAUZE 2X2 STER 10/PKG (GAUZE/BANDAGES/DRESSINGS) ×1
STAPLER 45 DA VINCI SURE FORM (STAPLE)
STAPLER 45 SUREFORM DVNC (STAPLE) IMPLANT
STAPLER 60 DA VINCI SURE FORM (STAPLE) ×3
STAPLER 60 SUREFORM DVNC (STAPLE) ×2 IMPLANT
STAPLER CANNULA SEAL DVNC XI (STAPLE) ×2 IMPLANT
STAPLER CANNULA SEAL XI (STAPLE) ×3
STAPLER ECHELON POWER CIR 29 (STAPLE) ×3 IMPLANT
STAPLER ECHELON POWER CIR 31 (STAPLE) IMPLANT
STAPLER RELOAD 3.5X45 BLU DVNC (STAPLE)
STAPLER RELOAD 3.5X45 BLUE (STAPLE)
STAPLER RELOAD 4.3X45 GREEN (STAPLE)
STAPLER RELOAD 4.3X45 GRN DVNC (STAPLE)
STAPLER RELOAD 4.3X60 GREEN (STAPLE) ×6
STAPLER RELOAD 4.3X60 GRN DVNC (STAPLE) ×4
STOPCOCK 4 WAY LG BORE MALE ST (IV SETS) ×6 IMPLANT
SURGILUBE 2OZ TUBE FLIPTOP (MISCELLANEOUS) IMPLANT
SUT MNCRL AB 4-0 PS2 18 (SUTURE) ×3 IMPLANT
SUT PDS AB 1 CT1 27 (SUTURE) ×6 IMPLANT
SUT PROLENE 0 CT 2 (SUTURE) IMPLANT
SUT PROLENE 2 0 KS (SUTURE) ×3 IMPLANT
SUT PROLENE 2 0 SH DA (SUTURE) IMPLANT
SUT SILK 2 0 (SUTURE)
SUT SILK 2 0 SH CR/8 (SUTURE) IMPLANT
SUT SILK 2-0 18XBRD TIE 12 (SUTURE) IMPLANT
SUT SILK 3 0 (SUTURE)
SUT SILK 3 0 SH CR/8 (SUTURE) ×3 IMPLANT
SUT SILK 3-0 18XBRD TIE 12 (SUTURE) IMPLANT
SUT V-LOC BARB 180 2/0GR6 GS22 (SUTURE)
SUT VIC AB 3-0 SH 18 (SUTURE) IMPLANT
SUT VIC AB 3-0 SH 27 (SUTURE)
SUT VIC AB 3-0 SH 27XBRD (SUTURE) IMPLANT
SUT VICRYL 0 UR6 27IN ABS (SUTURE) ×3 IMPLANT
SUTURE V-LC BRB 180 2/0GR6GS22 (SUTURE) IMPLANT
SYR 10ML ECCENTRIC (SYRINGE) ×3 IMPLANT
SYS LAPSCP GELPORT 120MM (MISCELLANEOUS)
SYSTEM LAPSCP GELPORT 120MM (MISCELLANEOUS) IMPLANT
TOWEL OR NON WOVEN STRL DISP B (DISPOSABLE) ×3 IMPLANT
TRAY FOLEY MTR SLVR 16FR STAT (SET/KITS/TRAYS/PACK) ×3 IMPLANT
TROCAR ADV FIXATION 5X100MM (TROCAR) ×3 IMPLANT
TUBING CONNECTING 10 (TUBING) ×6 IMPLANT
TUBING INSUFFLATION 10FT LAP (TUBING) ×3 IMPLANT

## 2020-03-09 NOTE — H&P (Signed)
f/u for transitional cell carcinoma  HPI: Isaac [name] is a 64 year-old male established patient who is here for surveillance of bladder cancer.  The patient has a significant extensive history of transitional cell carcinoma. He has had numerous bladder recurrences and also underwent a distal right ureterectomy with reimplantation by Dr Risa Grill 2010. His last bladder recurrence was in November 2013. He presented in August 2017 with gross hematuria with a negative culture. CT scan did not show an explanation for his hematuria. He did have urine cytology as well on his October 2017 follow-up, which was negative.   CT results from 12/2015: No CT findings to account for the patient's hematuria. Mild hepatic steatosis. Colonic diverticulosis, without evidence of diverticulitis. Small to moderate fat containing left inguinal hernia.   12/2016: Repeat cystoscopy demonstrated no evidence of recurrence. However, given the patient's ongoing gross hematuria CAT scan was repeated. The CAT scan this time demonstrated a large filling defect in the right collecting system with some enhancement of that region concerning for urothelial carcinoma.   9/18: the patient underwent cystoscopy and right ureteroscopy with biopsy of the area in the right kidney. Ureteroscopy demonstrated at least 3 calyces that were filled with papillary tumors consistent with transitional cell carcinoma. The biopsy demonstrated a low-grade TCC.   10/18: Status post right nephroureterectomy  11/18: The patient received adjuvant gem/cis chemotherapy.  03/2018: recurrent Bladder TCC, HGNMIBC (12oclock at bladder neck, left UO and posterior wall)  04/2018: Re-resection, no residual disease. Finshed BCG in March - first cysto was negative in June.   Intv: The patient presents today for scheduled follow-up. He has been having severe abdominal pain over the last fiber 6 days. I spoke to him on the phone, I recommended that he I do a bowel regimen.  He states that he felt some better after he had a several bowel movements but his symptoms have returned, and appear to be worse. He has had some low-grade fevers. He is having some associated voiding symptoms. He is having abdominal distention and pain.   In addition the patient is also complaining of urinary frequency and nocturia. This is basically unchanged over the course of the last 6 months.   02/20/20: A 64 year old male with the above noted past medical history who was treated for diverticulitis at last office visit with Flagyl and Cipro. He has 2 days left of his Cipro but has finished his Flagyl. He reports he is still having increased in worsening right lower quadrant pain. 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 He endorses pneumaturia, Fecaluria, suprapubic pain, dysuria. He denies fevers and chills.   He underwent a TURBT. His last bladder tumor was resected 04/25/2018. The patient had a right nephrouretectomy. This was removed on 02/12/2017.   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   He had treatment with the following intravesical agents: bcg. The patient underwent induction therapy and maintenance therapy. He underwent 1 rounds of maintenance therapy.   The patient's last treatment was 03/09/2019. His last cysto was 02/01/2019. His cystoscopy demonstrated NED.   His last radiologic test to evaluate the kidneys was 12/13/2017. Imaging results: PET CT - NED. The patient did not have labs prior to his office visit today.   He is not having pain in new locations. He has had blood in his urine recently. He has not recently had unwanted weight loss.  ALLERGIES: No Allergies    MEDICATIONS: Cipro 250 mg tablet 1 tablet PO BID  Finasteride 5 mg tablet 1 tablet PO Daily  Myrbetriq 50 mg tablet, extended release 24 hr  Tadalafil  5 mg tablet 1 tablet PO Daily  Tamsulosin Hcl 0.4 mg capsule 2 capsule PO Daily  Aspirin Low Dose TABS 1 Oral  Crestor 10 mg tablet 0 Oral  Glimepiride 2 mg tablet 0 Oral  Isosorbide Mononitrate Er 120 mg tablet, extended release 24 hr 1 Oral Daily  Pantoprazole Sodium 40 mg tablet, delayed release 0 Oral  Ropinirole Hcl 1 mg tablet 0 Oral  Sildenafil Citrate 20 mg tablet 2-5 tablets as needed  Toprol Xl 25 mg tablet, extended release 24 hr 1 Oral Daily  Uricalm Max  Victoza 2-Pak 0.6 mg/0.1 ml (18 mg/3 ml) pen injector     GU PSH: Bladder Instill AntiCA Agent - 09/28/2019, 03/09/2019, 02/22/2019, 02/15/2019, 2020, 2020, 2020, 2020, 2020, 2020, 2013, 2012 Cysto Bladder Ureth Biopsy - 04/28/2018, 2010, 2009, 2008 Cysto Uretero Biopsy Fulgura, Right - 2018, 2010, 2009 Cystoscopy - 02/13/2020, 09/12/2019, 06/14/2019, 02/01/2019, 10/25/2018, 03/08/2018, 2018, 2018, 2017 Cystoscopy Insert Stent, Right - 2018, 2010, 2009 Cystoscopy TURBT <2 cm - 2013, 2012, 2012 Cystoscopy TURBT 2-5 cm - 04/28/2018, 03/25/2018, 2013 Cystoscopy Ureteroscopy, Left - 04/28/2018, 2012, 2012, 2010, 2010 Inject For cystogram - 2018 Lap Nephro Ureterectomy, Right - 2018 Locm 300-399Mg /Ml Iodine,1Ml - 2018, 2017 Reimplant Hitch or Flap - 2010       PSH Notes: Knee Replacement, Knee Arthroscopy, Bladder Injection Of Cancer Treatment, Cystoscopy With Fulguration Medium Lesion (2-5cm), Cystoscopy With Fulguration Small Lesion (5-37mm), Bladder Injection Of Cancer Treatment, Cystoscopy With Ureteroscopy Right, Cystoscopy With Fulguration Small Lesion (5-11mm), Cystoscopy With Fulguration Small Lesion (5-71mm), Cystoscopy With Ureteroscopy Right, Cystoscopy With Ureteroscopy Right, Ureteroneocystostomy With Bladder Flap, Cystoscopy With Insertion Of Ureteral Stent Right, Cystoscopy With Ureteroscopy For Biopsy Right, Cystoscopy With Ureteroscopy Right, Cystoscopy With Biopsy, Sinus Surgery, Cystoscopy With Ureteroscopy With  Fulguration Of Lesion, Cystoscopy With Insertion Of Ureteral Stent Right, Cystoscopy With Biopsy, Cystoscopy With Biopsy, Shoulder Surgery, Elbow Surgery, Bladder Surgery, Appendectomy, Inguinal Hernia Repair   NON-GU PSH: Appendectomy - 2008 Revise Knee Joint - 2017     GU PMH: Bladder Cancer overlapping sites - 02/13/2020, - 05/17/2019, - 10/25/2018, - 03/08/2018 Dysuria - 02/13/2020, - 02/01/2019 Pelvic/perineal pain - 09/12/2019 Stress Incontinence - 06/29/2019, - 06/13/2019 Urge incontinence - 06/29/2019, - 06/13/2019 Urinary Frequency - 06/29/2019, - 06/13/2019 Urinary Retention, Unspec - 06/29/2019, - 06/13/2019 Acute prostatitis - 10/25/2018 Renal pelvis cancer, right (Stable) - 2018, - 2018 Flank Pain - 2018 Gross hematuria - 2018, - 2017 Weak Urinary Stream - 2018 Acute Cystitis/UTI (Improving) - 2017, - 2017, Acute cystitis without hematuria, - 2017 BPH w/LUTS - 2017 History of bladder cancer, History of bladder cancer - 2017, History of bladder cancer, - 2016, History of malignant neoplasm of bladder, - 2015 BPH w/o LUTS, Enlarged prostate without lower urinary tract symptoms (luts) - 2017 ED due to arterial insufficiency, Erectile dysfunction due to arterial insufficiency - 2016 Bladder Cancer, Unspec, Bladder cancer - 2014      PMH Notes:  Past Gu Hx:    [name] returns for follow-up. We got involved in his care in 2010 due to transitional cell carcinoma involving his distal right ureter. Prior to that, Dr. 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 He is status post right ureteral resection with reimplantation  due to recurrent transitional cell carcinoma of the bladder and involvement of the distal right ureter (June 2010). Again, we felt nephroureterectomy would be overaggressive therapy. The patient had reassessment (02/2009). This included a cystogram which showed nice reflux up the right side with no  evidence of stricturing, no evidence of filling defects. We were able to drive the ureteroscope right up the ureter as well and has had no evidence of recurrence. Urine cytology was unremarkable and there was no evidence of any bladder cancer. Overall we were quite pleased with things and he has recovered from that procedure very nicely. He continues to do well. Follow up 06/2009 including cystoscopy and NMP-22 was negative.    Follow up 10/2009 cystoscopy and cytology were negative.   Follow up 02/2010 cystoscopy and cytology were negative CT scan also ok.    [name] status post his most recent cystoscopy with bladder biopsy. Since my distal ureterectomy with reimplant, he has had no evidence of recurrences until his follow-up in February of 2012 where we saw some carpeting of papillary tumor. The patient recently underwent cold-cup biopsy/resection of these areas along with fulguration. Pathology did indeed confirm this to be transitional cell carcinoma, but fortunately it was low-grade and noninvasive. He has had 2 induction courses of BCG and did not really tolerate the last one that well.    [name] was noted to have several small bladder tumor recurrences in November of 2012. The patient had papillary tumors noted. These were noninvasive, felt to be low grade on the trigone. The posterior wall tumor looked mostly low grade with some small little foci of potentially higher grade tumor, but again, no evidence of invasion on either lesion. The patient did have installation of mitomycin.    His most recent recurrence was in his bladder in November of 2013. He had a solitary tumor at the dome of his bladder. Pathology revealed this to be low-grade and noninvasive.    [name] did undergo retrograde pyelography as well as cystogram to look at the upper tracts back in December 2013 and there was no evidence of filling defect or ureteral obstruction.    April of 2014. At that time cystoscopy was  negative and NMP 22 testing was also negative.   August 2014: Negative cystoscopy   December 2014: Negative cystoscopy and NMP-22 testing.   June 2015: Negative cystoscopy. Positive atypia on urine cytology with negative FISH testing   January 2016: Negative cystoscopy and negative upper tract imaging by hematuria protocol CT scan      NON-GU PMH: Diverticulitis - 02/13/2020 Muscle weakness (generalized) - 07/05/2019, - 06/29/2019, - 06/13/2019 Other muscle spasm - 07/05/2019, - 06/29/2019, - 06/13/2019 Other specified disorders of muscle - 07/05/2019, - 06/29/2019, - 06/13/2019 Encounter for general adult medical examination without abnormal findings, Encounter for preventive health examination - 08/27/13 Personal history of other diseases of the circulatory system, History of hypertension - 27-Aug-2012 Personal history of other diseases of the digestive system, History of esophageal reflux - 08/27/12 Personal history of other diseases of the nervous system and sense organs, History of sleep apnea - Aug 27, 2012 Personal history of other endocrine, nutritional and metabolic disease, History of diabetes mellitus - Aug 27, 2012, History of hypercholesterolemia, - 08/27/2012 Personal history of other mental and behavioral disorders, History of depression - 2012/08/27    FAMILY HISTORY: Death In The Family Father - Father Death In The Family Mother - Mother Diabetes - Grandmother   SOCIAL HISTORY: Marital Status: Single Preferred Language: English; Ethnicity:  Not Hispanic Or Latino; Race: White Current Smoking Status: Patient does not smoke anymore.      Notes: Former smoker, Marital History - Single, Tobacco Use, Caffeine Use, Alcohol Use   REVIEW OF SYSTEMS:    GU Review Male:   Patient reports frequent urination, hard to postpone urination, and burning/ pain with urination. Patient denies get up at night to urinate, leakage of urine, stream starts and stops, trouble starting your stream, have to strain to urinate , erection problems,  and penile pain.  Gastrointestinal (Upper):   Patient denies nausea, vomiting, and indigestion/ heartburn.  Gastrointestinal (Lower):   Patient reports constipation. Patient denies diarrhea.  Constitutional:   Patient denies fever, night sweats, weight loss, and fatigue.  Skin:   Patient denies skin rash/ lesion and itching.  Eyes:   Patient denies blurred vision and double vision.  Ears/ Nose/ Throat:   Patient denies sore throat and sinus problems.  Hematologic/Lymphatic:   Patient denies swollen glands and easy bruising.  Cardiovascular:   Patient denies leg swelling and chest pains.  Respiratory:   Patient denies cough and shortness of breath.  Endocrine:   Patient denies excessive thirst.  Musculoskeletal:   Patient denies back pain and joint pain.  Neurological:   Patient denies headaches and dizziness.  Psychologic:   Patient denies depression and anxiety.   Notes: right lower quadrant pain    VITAL SIGNS:      02/20/2020 10:54 AM  Weight 214 lb / 97.07 kg  Height 69 in / 175.26 cm  BP 139/86 mmHg  Pulse 77 /min  BMI 31.6 kg/m   MULTI-SYSTEM PHYSICAL EXAMINATION:    Constitutional: Well-nourished. No physical deformities. Normally developed. Good grooming.  Respiratory: No labored breathing, no use of accessory muscles.   Cardiovascular: Normal temperature, normal extremity pulses, no swelling, no varicosities.  Skin: No paleness, no jaundice, no cyanosis. No lesion, no ulcer, no rash.  Neurologic / Psychiatric: Oriented to time, oriented to place, oriented to person. No depression, no anxiety, no agitation.  Gastrointestinal: No mass, no tenderness, no rigidity, non obese abdomen.  Musculoskeletal: Normal gait and station of head and neck.   Notes: Right lower quadrant tenderness with palpation.      Complexity of Data:  Source Of History:  Patient, Healthcare Provider, Medical Record Summary  Records Review:   Previous Doctor Records, Previous Hospital Records,  Previous Patient Records  Urine Test Review:   Urinalysis, Urine Culture  X-Ray Review: C.T. Abdomen/Pelvis: Reviewed Films. Reviewed Report. Discussed With Patient.     11/17/14 03/30/12 08/28/08 05/20/06  PSA  Total PSA 0.58  0.44  0.35  1.13     06/04/05  Hormones  Testosterone, Total 2.68     02/20/20  Urinalysis  Urine Appearance Slightly Cloudy   Urine Color Yellow   Urine Glucose 3+ mg/dL  Urine Bilirubin Neg mg/dL  Urine Ketones Neg mg/dL  Urine Specific Gravity 1.020   Urine Blood Neg ery/uL  Urine pH 5.5   Urine Protein Neg mg/dL  Urine Urobilinogen 0.2 mg/dL  Urine Nitrites Neg   Urine Leukocyte Esterase 2+ leu/uL  Urine WBC/hpf 10 - 20/hpf   Urine RBC/hpf 0 - 2/hpf   Urine Epithelial Cells NS (Not Seen)   Urine Bacteria NS (Not Seen)   Urine Mucous Not Present   Urine Yeast NS (Not Seen)   Urine Trichomonas Not Present   Urine Cystals NS (Not Seen)   Urine Casts NS (Not Seen)   Urine Sperm Not  Present    PROCEDURES:          Urinalysis w/Scope Dipstick Dipstick Cont'd Micro  Color: Yellow Bilirubin: Neg mg/dL WBC/hpf: 10 - 20/hpf  Appearance: Slightly Cloudy Ketones: Neg mg/dL RBC/hpf: 0 - 2/hpf  Specific Gravity: 1.020 Blood: Neg ery/uL Bacteria: NS (Not Seen)  pH: 5.5 Protein: Neg mg/dL Cystals: NS (Not Seen)  Glucose: 3+ mg/dL Urobilinogen: 0.2 mg/dL Casts: NS (Not Seen)    Nitrites: Neg Trichomonas: Not Present    Leukocyte Esterase: 2+ leu/uL Mucous: Not Present      Epithelial Cells: NS (Not Seen)      Yeast: NS (Not Seen)      Sperm: Not Present    ASSESSMENT:      ICD-10 Details  1 GU:   Bladder Cancer overlapping sites - C67.8 Chronic, Stable  2 NON-GU:   Diverticulitis - K57.80 Acute, Systemic Symptoms   PLAN:            Medications Refill Meds: Cipro 250 mg tablet 1 tablet PO BID   #14  0 Refill(s)    New Meds: Ondansetron Hcl 4 mg tablet 1 tablet PO Q 8 H PRN for nausea  #14  0 Refill(s)            Orders Labs CULTURE, URINE           Schedule Return Visit/Planned Activity: 3 Months - Office Visit, Follow up MD, Cystoscopy          Document Letter(s):  Created for Patient: Clinical Summary         Notes:   Urine sent for culture. Added a second week of antibiotics. Spoke with [facility], and they are in process of getting him an appoijtment with a colorectal surgeon this week. Advised strict retun precautions as well as well a ED precautions for worsening symptoms. He will not be able to undergo BCG treatments and theses should be canceled. HE will need follow up with Dr. Louis Meckel for scheduled cystoscopy and surveillance.

## 2020-03-09 NOTE — Anesthesia Procedure Notes (Signed)
Procedure Name: Intubation Performed by: Rosaland Lao, CRNA Pre-anesthesia Checklist: Patient identified, Emergency Drugs available, Suction available and Patient being monitored Patient Re-evaluated:Patient Re-evaluated prior to induction Oxygen Delivery Method: Circle system utilized Preoxygenation: Pre-oxygenation with 100% oxygen Induction Type: IV induction and Rapid sequence Laryngoscope Size: Mac and 4 Grade View: Grade I Tube type: Oral Tube size: 7.5 mm Number of attempts: 1 Airway Equipment and Method: Stylet Placement Confirmation: ETT inserted through vocal cords under direct vision,  positive ETCO2 and breath sounds checked- equal and bilateral Secured at: 22 cm Tube secured with: Tape Dental Injury: Teeth and Oropharynx as per pre-operative assessment

## 2020-03-09 NOTE — Transfer of Care (Signed)
Immediate Anesthesia Transfer of Care Note  Patient: JOB HOLTSCLAW  Procedure(s) Performed: Procedure(s): ROBOTIC LOW ANTERIOR RECTOSIGMOID RESECTION, BILATERAL TAP BLOCK, FIREFLY (N/A) POSSIBLE OSTOMY (N/A) PANENDOSCOPY RIGID (N/A) CYSTOSCOPY WITH FIREFLYER  PLACEMENT (N/A)  Patient Location: PACU  Anesthesia Type:General  Level of Consciousness: Alert, Awake, Oriented  Airway & Oxygen Therapy: Patient Spontanous Breathing  Post-op Assessment: Report given to RN  Post vital signs: Reviewed and stable  Last Vitals:  Vitals:   03/09/20 1049 03/09/20 1639  BP: (!) 139/92 119/71  Pulse: 76 89  Resp: 16 13  Temp: 36.9 C 36.4 C  SpO2: 88% 64%    Complications: No apparent anesthesia complications

## 2020-03-09 NOTE — Anesthesia Postprocedure Evaluation (Signed)
Anesthesia Post Note  Patient: Isaac [name]  Procedure(s) Performed: ROBOTIC LOW ANTERIOR RECTOSIGMOID RESECTION, BILATERAL TAP BLOCK, FIREFLY (N/A Abdomen) POSSIBLE OSTOMY (N/A ) PANENDOSCOPY RIGID (N/A ) CYSTOSCOPY WITH FIREFLYER  PLACEMENT (N/A )     Patient location during evaluation: PACU Anesthesia Type: General Level of consciousness: awake and alert Pain management: pain level controlled Vital Signs Assessment: post-procedure vital signs reviewed and stable Respiratory status: spontaneous breathing, nonlabored ventilation and respiratory function stable Cardiovascular status: blood pressure returned to baseline and stable Postop Assessment: no apparent nausea or vomiting Anesthetic complications: no   No complications documented.  Last Vitals:  Vitals:   03/09/20 1730 03/09/20 1745  BP: 114/79 108/72  Pulse: 82 83  Resp: (!) 9 11  Temp:    SpO2: 95% 95%    Last Pain:  Vitals:   03/09/20 1730  TempSrc:   PainSc: [place]

## 2020-03-09 NOTE — Interval H&P Note (Signed)
History and Physical Interval Note:  03/09/2020 11:08 AM  Isaac [name]  has presented today for surgery, with the diagnosis of colovesical fistula.  The various methods of treatment have been discussed with the patient and family. After consideration of risks, benefits and other options for treatment, the patient has consented to  Procedure(s): ROBOTIC RESECTION OF COLON, RECTOSIGMOID, POSSIBLE BLADDER REPAIR (N/A) POSSIBLE OSTOMY (N/A) PANENDOSCOPY RIGID (N/A) CYSTOSCOPY WITH FIREFLYER  PLACEMENT (N/A) as a surgical intervention.  The patient's history has been reviewed, patient examined, no change in status, stable for surgery.  I have reviewed the patient's chart and labs.  Questions were answered to the patient's satisfaction.    I have re-reviewed the the patient's records, history, medications, and allergies.  I have re-examined the patient.  I again discussed intraoperative plans and goals of post-operative recovery.  The patient agrees to proceed.  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/18/2017   Goals of care, counseling/discussion 03/18/2017   Renal mass 02/12/2017   Non-ST elevation MI (NSTEMI) ([place]) 10/18/2016   Peripheral neuropathy    OSA on CPAP    GERD (gastroesophageal reflux disease)    Type 2 diabetes mellitus ([place])    Bladder cancer ([place])    Gout 10/22/2015   History of adenomatous polyp of colon 09/07/2013   Anxiety and depression 06/19/2011   History of skin cancer  06/19/2011   Hyperlipidemia 05/10/2008   CAD, NATIVE VESSEL 05/10/2008   RESTLESS LEGS SYNDROME 11/17/2007   Essential hypertension 11/16/2007    Past Medical History:  Diagnosis Date   Arthritis    Bladder cancer [facility]) urologist-- dr Louis Meckel   11/ 2019 recurrent    Cancer of renal pelvis, right [facility])    oncologist-  dr [name]--  high grade papillary urothelial carcinoma, Stage III (T3 Nx M0) -- 02-12-2017  s/p  left nephrectomy--- completed chemotherapy 07-27-2017   Cataract    Chemotherapy-induced neuropathy (HCC)    feet   CKD (chronic kidney disease) stage 3, GFR 30-59 ml/min (HCC)    Coronary artery disease    followed by Dr. [name]; Lexiscan Myoview (10/14):  Low risk, EF 57%, small inf infarct   Coronary vasospasm ([place]) PER DR ROSS NOTE 03-22-2012--  INTER. 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 w/ reimplantation   History of non-ST elevation myocardial infarction (NSTEMI) 2005   Hyperlipidemia    Hypertension    Myocardial infarction ([place]) 2005   Nocturia more than twice per night    OSA on CPAP    Pneumonia  Sleep apnea    Solitary right kidney    s/p left nephrectomy 02-12-2017   Type 2 diabetes mellitus treated with insulin [facility])    endocrinologist-- dr Steffanie Dunn ([place] in [place])  lov note in epic    Urgency of urination    Wears glasses    Wisdom teeth extracted     Past Surgical History:  Procedure Laterality Date   APPENDECTOMY  08-03-2004   CARDIAC CATHETERIZATION  x3  last one 06-17-2007   MILD NON-OBSTRUCTIVE CAD/ NORMAL LVF/ 30% LEFT RENAL ARTERY  STENOSIS   CARDIOVASCULAR STRESS TEST  02/23/2013   Low risk nuclear study w/ small inferior wall infarct at mid and basal level with no ischemia/  normal LV function and wall motion , ef 57%   CYSTO/ BILATERAL RETROGRADE PYELOGRAM/ RIGHT URETEROSCOPIC LASER FULGURATION URETERAL TUMOR  02-16-2008   CYSTO/ BLADDER BX  09-13-2008;  08-13-2007; 04-19-2007; 06-01-2006   CYSTO/ CYSTOGRAM/ URETEROSCOPY  02-21-2009   CYSTO/ RESECTION BLADDER TUMOR/ LEFT RETROGRADE PYELOGRAM/ RIGHT URETEROSCOPY  08-07-2010   TCC OF BLADDER   S/P DISTAL URETERECTOMY/ REIMPLANTATION   CYSTO/ RIGHT URETEROSCOPY/ BX URETERAL TUMOR  10-11-2008   CYSTOSCOPY W/ RETROGRADES  04/19/2012   Procedure: CYSTOSCOPY WITH RETROGRADE PYELOGRAM;  Surgeon: Bernestine Amass, MD;  Location: [facility];  Service: Urology;  Laterality: Bilateral;  30 MINS Cysto, Biopsy, possible TURBT, Bilateral retrograde pyelograms with Mitomycin C instilation Post op    CYSTOSCOPY W/ RETROGRADES Left 03/25/2018   Procedure: CYSTOSCOPY WITH RETROGRADE PYELOGRAM;  Surgeon: [name], MD;  Location: [facility];  Service: Urology;  Laterality: Left;   CYSTOSCOPY WITH BIOPSY  04/07/2011   Procedure: CYSTOSCOPY WITH BIOPSY/ RIGHT URETEROSCOPY;  Surgeon: Bernestine Amass, MD;  Location[facility];  Service: Urology;  Laterality: N/A;    cystoscopy, cystogram, biopsy and fulgeration   CYSTOSCOPY WITH BIOPSY  12/29/2011   Procedure: CYSTOSCOPY WITH BIOPSY;  Surgeon: Bernestine Amass, MD;  Location: [facility];  Service: Urology;  Laterality: N/A;  30 MIN  WITH FULGERATION     CYSTOSCOPY WITH RETROGRADE PYELOGRAM, URETEROSCOPY AND STENT PLACEMENT Left 04/28/2018   Procedure: LEFT RETROGRADE PYELOGRAM, DIAGNOSTIC LEFT URETEROSCOPY;  Surgeon: [name], MD;  Location: [facility];  Service: Urology;  Laterality: Left;   ELBOW SURGERY  07-07-2003   RIGHT ELBOW ARTHROSCOPY W/ OPEN  RECONSTRUCTION   IR FLUORO GUIDE PORT INSERTION RIGHT  04/01/2017   IR US GUIDE VASC ACCESS RIGHT  04/01/2017   KNEE ARTHROSCOPY W/ DEBRIDEMENT  02-08-2004   RIGHT KNEE   RHINOPLASTY W/ MODIFIED CARTILAGE GRAFT  05-17-2007   INTERNAL NASAL VALVE COLLAPSE/ OSA/ SEPTAL PERFERATION   RIGHT DISTAL URETERECTOMY W/ REIMPLANTATION  10-30-2008   TCC OF RIGHT DISTAL URETER   ROBOT ASSITED LAPAROSCOPIC NEPHROURETERECTOMY Right 02/12/2017   Procedure: XI ROBOT ASSITED LAPAROSCOPIC NEPHROURETERECTOMY;  Surgeon: [name], MD;  Location: [facility];  Service: Urology;  Laterality: Right;   SHOULDER SURGERY  2005   RIGHT ROTATOR CUFF REPAIR   TOTAL KNEE ARTHROPLASTY Left 05/18/2015   TOTAL KNEE ARTHROPLASTY Left 05/18/2015   Procedure: TOTAL KNEE ARTHROPLASTY;  Surgeon: Earlie Server, MD;  Location: [place];  Service: Orthopedics;  Laterality: Left;   TOTAL KNEE ARTHROPLASTY Right 03/21/2016   Procedure: TOTAL KNEE ARTHROPLASTY;  Surgeon: Earlie Server, MD;  Location: [place];  Service: Orthopedics;  Laterality: Right;   TRANSTHORACIC ECHOCARDIOGRAM  02/03/2017   ef 79-89%, grade 1 diastolic dysfunction/  trivial PR  TRANSURETHRAL RESECTION OF BLADDER TUMOR  01-13-2007   TRANSURETHRAL RESECTION OF BLADDER TUMOR  04/19/2012   Procedure: TRANSURETHRAL RESECTION OF BLADDER TUMOR (TURBT);  Surgeon: Bernestine Amass, MD;  Location: [facility];  Service: Urology;  Laterality: N/A;   TRANSURETHRAL RESECTION OF BLADDER TUMOR N/A 03/25/2018   Procedure: TRANSURETHRAL RESECTION OF BLADDER TUMOR (TURBT);  Surgeon: [name], MD;  Location: [facility];  Service: Urology;  Laterality: N/A;   TRANSURETHRAL RESECTION OF BLADDER TUMOR N/A 04/28/2018   Procedure: TRANSURETHRAL RESECTION OF BLADDER TUMOR (TURBT);  Surgeon: [name], MD;  Location: [facility];  Service: Urology;  Laterality: N/A;   umbillical hernia repair  2004    Social History   Socioeconomic History    Marital status: Single    Spouse name: Not on file   Number of children: Not on file   Years of education: Not on file   Highest education level: Not on file  Occupational History   Not on file  Tobacco Use   Smoking status: Former Smoker    Years: 20.00    Types: Cigarettes    Quit date: 05/13/2007    Years since quitting: 12.8   Smokeless tobacco: Never Used  Scientific laboratory technician Use: Never used  Substance and Sexual Activity   Alcohol use: Not Currently    Alcohol/week: 3.0 - 4.0 standard drinks    Types: 3 - 4 Cans of beer per week   Drug use: No   Sexual activity: Not on file  Other Topics Concern   Not on file  Social History Narrative   Not on file   Social Determinants of Health   Financial Resource Strain:    Difficulty of Paying Living Expenses: Not on file  Food Insecurity:    Worried About Charity fundraiser in the Last Year: Not on file   [employer] of Food in the Last Year: Not on file  Transportation Needs:    Lack of Transportation (Medical): Not on file   Lack of Transportation (Non-Medical): Not on file  Physical Activity:    Days of Exercise per Week: Not on file   Minutes of Exercise per Session: Not on file  Stress:    Feeling of Stress : Not on file  Social Connections:    Frequency of Communication with Friends and Family: Not on file   Frequency of Social Gatherings with Friends and Family: Not on file   Attends Religious Services: Not on file   Active Member of Clubs or Organizations: Not on file   Attends Archivist Meetings: Not on file   Marital Status: Not on file  Intimate Partner Violence:    Fear of Current or Ex-Partner: Not on file   Emotionally Abused: Not on file   Physically Abused: Not on file   Sexually Abused: Not on file    Family History  Problem Relation Age of Onset   Coronary artery disease Father    Diabetes Maternal Grandmother    Heart disease Paternal Grandfather    Colon cancer Neg Hx    Stomach  cancer Neg Hx    Esophageal cancer Neg Hx    Rectal cancer Neg Hx     Medications Prior to Admission  Medication Sig Dispense Refill Last Dose   aspirin EC 81 MG tablet Take 81 mg by mouth at bedtime.    03/05/2020   ciprofloxacin (CIPRO) 250 MG tablet Take 250 mg by  mouth 2 (two) times daily.   03/07/2020   fluconazole (DIFLUCAN) 200 MG tablet Take 200 mg by mouth daily.   Past Week at Unknown time   glimepiride (AMARYL) 2 MG tablet TAKE 1 BY MOUTH DAILY BEFORE SUPPER (Patient taking differently: Take 2 mg by mouth 2 (two) times daily before a meal. ) 90 tablet 1 03/07/2020   Insulin Glargine, 1 Unit Dial, (TOUJEO SOLOSTAR) 300 UNIT/ML SOPN Inject 40 Units into the skin 2 (two) times daily.    03/09/2020 at 0730   isosorbide mononitrate (IMDUR) 120 MG 24 hr tablet TAKE 1 TABLET BY MOUTH DAILY (Patient taking differently: Take 120 mg by mouth daily. ) 90 tablet 2 03/09/2020 at 0730   liraglutide (VICTOZA) 18 MG/3ML SOPN Inject 0.6 mg into the skin daily.  (Patient not taking: Reported on 03/08/2020)      metoprolol succinate (TOPROL-XL) 25 MG 24 hr tablet TAKE 1 TABLET BY MOUTH AT BEDTIME. GENERIC EQUIVALENT FOR TOPROL-XL (Patient taking differently: Take 25 mg by mouth at bedtime. ) 90 tablet 3 03/08/2020 at 2000   pantoprazole (PROTONIX) 40 MG tablet Take 1 tablet (40 mg total) by mouth every morning. 90 tablet 3 03/09/2020 at [place] into the lungs at bedtime. CPAP   Past Week at Unknown time   rOPINIRole (REQUIP) 2 MG tablet TAKE 1 TABLET BY MOUTH AT BEDTIME (Patient taking differently: Take 2 mg by mouth at bedtime. ) 90 tablet 3 03/08/2020 at Unknown time   rosuvastatin (CRESTOR) 10 MG tablet TAKE 1 TAB (10 MG) BY MOUTH EVERY OTHER DAY (Patient taking differently: Take 10 mg by mouth every other day. ) 45 tablet 2 03/05/2020   tamsulosin (FLOMAX) 0.4 MG CAPS capsule Take 0.4 mg by mouth daily.   03/05/2020   acetaminophen (TYLENOL) 500 MG tablet Take 500 mg by  mouth every 6 (six) hours as needed for moderate pain or headache.   More than a month at Unknown time   ALPRAZolam (XANAX) 1 MG tablet Take 1 tablet (1 mg total) by mouth 3 (three) times daily as needed for anxiety. 90 tablet 0 More than a month at Unknown time   ciclopirox (PENLAC) 8 % solution Apply 1 application topically at bedtime as needed (toenail fungus). APPLY ON NAIL AND SURROUNDING SKIN OVER PREVIOUS COAT. AFTER 7 DAYS REMOVE WITH ALCOHOL AND REPEAT CYCLE.   Unknown at Unknown time   diazepam (VALIUM) 10 MG tablet 10 mg. Insert 10 mg rectally at night as needed bowel relaxation   More than a month at Unknown time   finasteride (PROSCAR) 5 MG tablet Take 5 mg by mouth daily.   03/07/2020   ketoconazole (NIZORAL) 2 % cream Apply 1 application topically daily. 30 g 0 Not Taking at Unknown time   nitroGLYCERIN (NITROSTAT) 0.4 MG SL tablet Place 1 tablet (0.4 mg total) under the tongue every 5 (five) minutes as needed for chest pain. Repeat every 5 minute up to 3 doses if no relief call 911. 25 tablet 6 More than a month at Unknown time   ondansetron (ZOFRAN) 4 MG tablet Take 4 mg by mouth every 8 (eight) hours as needed for nausea/vomiting.   More than a month at Unknown time    Current Facility-Administered Medications  Medication Dose Route Frequency Provider Last Rate Last Admin   bisacodyl (DULCOLAX) EC tablet 20 mg  20 mg Oral Once [name], MD       bupivacaine liposome (EXPAREL) 1.3 % injection  266 mg  20 mL Infiltration Once [name], MD       cefoTEtan (CEFOTAN) 2 g in sodium chloride 0.9 % 100 mL IVPB  2 g Intravenous On Call to OR [name], MD       feeding supplement (ENSURE PRE-SURGERY) liquid 296 mL  296 mL Oral Once [name], MD       feeding supplement (ENSURE PRE-SURGERY) liquid 592 mL  592 mL Oral Once [name], MD       lactated ringers infusion   Intravenous Continuous Lillia Abed, MD       Facility-Administered Medications Ordered in Other  Encounters  Medication Dose Route Frequency Provider Last Rate Last Admin   LORazepam (ATIVAN) injection 0.5 mg  0.5 mg Intravenous Once [place], Holli Humbles, NP       sodium chloride flush (NS) 0.9 % injection 10 mL  10 mL Intravenous PRN [name], MD   10 mL at 06/08/17 1150     Allergies  Allergen Reactions   Hydrocodone Itching and Nausea And Vomiting   Dilaudid [Hydromorphone] Nausea And Vomiting   Nsaids     Has 1 kidney left    BP (!) 139/92   Pulse 76   Temp 98.5 F (36.9 C) (Oral)   Resp 16   Ht 5\' 9"  (1.753 m)   Wt 95.3 kg   SpO2 99%   BMI 31.01 kg/m   Labs: No results found for this or any previous visit (from the past 48 hour(s)).  Imaging / Studies: No results found.   [name], M.D., F.A.C.S. Gastrointestinal and Minimally Invasive Surgery [facility] [address], [place], [place] [address] [phone] Main / Paging  03/09/2020 11:08 AM    [name]

## 2020-03-09 NOTE — Anesthesia Preprocedure Evaluation (Addendum)
Anesthesia Evaluation  Patient identified by MRN, date of birth, ID band Patient awake    Reviewed: Allergy & Precautions, NPO status , Patient's Chart, lab work & pertinent test results, reviewed documented beta blocker date and time   History of Anesthesia Complications Negative for: history of anesthetic complications  Airway Mallampati: I  TM Distance: >3 FB Neck ROM: Full    Dental  (+) Teeth Intact   Pulmonary sleep apnea and Continuous Positive Airway Pressure Ventilation , former smoker,    Pulmonary exam normal        Cardiovascular hypertension, Pt. on home beta blockers and Pt. on medications + angina + CAD and + Past MI (NSTEMI 2005)  Normal cardiovascular exam     Neuro/Psych Anxiety Depression negative neurological ROS     GI/Hepatic Neg liver ROS, GERD  ,  Endo/Other  diabetes, Type 2, Insulin Dependent  Renal/GU Renal InsufficiencyRenal disease (solitary kidney; Cr 1.79)   Urothelial carcinoma s/p right nephroureterectomy, chemo; now with colovesical fistula    Musculoskeletal  (+) Arthritis ,   Abdominal   Peds  Hematology Thrombocytopenia- plts 98   Anesthesia Other Findings   Reproductive/Obstetrics                            Anesthesia Physical Anesthesia Plan  ASA: III  Anesthesia Plan: General   Post-op Pain Management:    Induction: Intravenous  PONV Risk Score and Plan: 3 and Ondansetron, Dexamethasone, Treatment may vary due to age or medical condition and Midazolam  Airway Management Planned: Oral ETT  Additional Equipment: None  Intra-op Plan:   Post-operative Plan: Extubation in OR  Informed Consent: I have reviewed the patients History and Physical, chart, labs and discussed the procedure including the risks, benefits and alternatives for the proposed anesthesia with the patient or authorized representative who has indicated his/her understanding  and acceptance.     Dental advisory given  Plan Discussed with:   Anesthesia Plan Comments:        Anesthesia Quick Evaluation

## 2020-03-09 NOTE — Interval H&P Note (Signed)
History and Physical Interval Note:  03/09/2020 11:39 AM  Isaac [name]  has presented today for surgery, with the diagnosis of colovesical fistula.  The various methods of treatment have been discussed with the patient and family. After consideration of risks, benefits and other options for treatment, the patient has consented to  Procedure(s): ROBOTIC RESECTION OF COLON, RECTOSIGMOID, POSSIBLE BLADDER REPAIR (N/A) POSSIBLE OSTOMY (N/A) PANENDOSCOPY RIGID (N/A) CYSTOSCOPY WITH FIREFLYER  PLACEMENT (N/A) as a surgical intervention.  The patient's history has been reviewed, patient examined, no change in status, stable for surgery.  I have reviewed the patient's chart and labs.  Questions were answered to the patient's satisfaction.     [name]

## 2020-03-09 NOTE — Op Note (Addendum)
03/09/2020  4:37 PM  PATIENT:  Isaac [name]  64 y.o. male  Patient Care Team: Renold Genta [name], MD as PCP - General (Internal Medicine) [name], MD as PCP - Cardiology (Cardiology) [name], MD as Consulting Physician (Colon and Rectal Surgery) [name], MD as Attending Physician (Urology) [name], MD as Consulting Physician (Gastroenterology)  PRE-OPERATIVE DIAGNOSIS:  colovesical fistula  POST-OPERATIVE DIAGNOSIS:  COLOVESICAL FISTULA  PROCEDURE:   ROBOTIC LOW ANTERIOR RECTOSIGMOID RESECTION BILATERAL TAP BLOCK,  ASSESSMENT OF TISSUE PERFUSION USING FIREFLY IMMUNOFLUORESENCE ENDOSCOPY RIGID  SURGEON:  [name], MD  ASSISTANT: Carlena Hurl, PA-C An experienced assistant was required given the standard of surgical care given the complexity of the case.  This assistant was needed for exposure, dissection, suctioning, retraction, instrument exchange, etc.  ANESTHESIA:     General  Nerve block provided with liposomal bupivacaine (Experel) mixed with 0.25% bupivacaine as a Bilateral TAP block x 65mL each side at the level of the transverse abdominis & preperitoneal spaces along the flank at the anterior axillary line, from subcostal ridge to iliac crest under laparoscopic guidance   Local field block at port sites & extraction wound  EBL:  Total I/O In: 2600 [I.V.:2600] Out: 700 [Urine:400; Blood:300]  Delay start of Pharmacological VTE agent (>24hrs) due to surgical blood loss or risk of bleeding:  no  DRAINS: 19 Fr Blake drain goes to the pelvis  SPECIMEN:   RECTOSIGMOID COLON (open end proximal) DISTAL ANASTOMOTIC RING (final distal margin)  DISPOSITION OF SPECIMEN:  PATHOLOGY  COUNTS:  YES  PLAN OF CARE: Admit to inpatient   PATIENT DISPOSITION:  PACU - hemodynamically stable.  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  Has  developed recurrent diverticulitis.  Pneumaturia and fecaluria with evidence of colovesical fistula.  Endoscopy disproved any malignancy.  I recommended segmental resection:  The anatomy & physiology of the digestive tract was discussed.  The pathophysiology of  fistula between the bowel and bladder was discussed.  Natural history risks without surgery was discussed. I worked to give an overview of the disease and the frequent need to have multispecialty involvement.   I feel the risks of no intervention will lead to serious problems that outweigh the operative risks; therefore, I recommended surgery to treat the pathology.  Laparoscopic & open techniques for partial proctocolectomy with bladder repair were discussed.  Possible fecal diversion by ostomy was discussed.  We will work to preserve anal & pelvic floor function without sacrificing cure.  Need for prolonged bladder catheterization was discussed.  Risks such as bleeding, infection, abscess, leak, injury to other organs, need for repair of tissues / organs, recurrence with reoperation, possible ostomy, hernia, heart attack, death, and other risks were discussed.  I noted a good likelihood this will help address the problem.   Goals of post-operative recovery were discussed as well.  We will work to minimize complications.  An educational handout on the pathology was given as well.  Questions were answered.    The patient expresses understanding & wishes to proceed with surgery. .  OR FINDINGS:   Patient had somewhat redundant sigmoid colon with very dense adhesions to the right dome of the bladder consistent with Exam, CAT scan and cystoscopic findings of colovesical fistula.  No active opening on the bladder at this time so more aggressive bladder repair not done.  No obvious metastatic disease on visceral parietal peritoneum  or liver.  The anastomosis rests 12 cm from the anal verge by rigid proctoscopy.  It is a 24 EEA colorectal stapled  anastomosis distal to the bladder dome  CASE DATA:  Type of patient?: Elective [facility] Private Case  Status of Case? Elective Scheduled  Infection Present At Time Of Surgery (PATOS)?  PHLEGMON  DESCRIPTION:   Informed consent was confirmed.  The patient underwent general anaesthesia without difficulty.  The patient was positioned appropriately.  VTE prevention in place.  Patient underwent cystoscopy with firefly injection of his remaining left ureter since the right ureter surgically absent.  This is done by Dr. Louis Meckel.  Please see his separate operative note The patient was clipped, prepped, & draped in a sterile fashion.  Surgical timeout confirmed our plan.  The patient was positioned in reverse Trendelenburg.  Abdominal entry was gained using Varess technique at the left subcostal ridge on the anterior abdominal wall.  No elevated EtCO2 noted.  Port placed.  Camera inspection revealed no injury.  Extra ports were carefully placed under direct laparoscopic visualization.  Freed off omental adhesions off the anterior abdominal wall.   I reflected the greater omentum and the upper abdomen the small bowel in the upper abdomen.  The patient was carefully positioned.  The Intuitive daVinci robot was docked with camera & instruments carefully placed.  The patient had obvious phlegmon of rectosigmoid colon densely adherent to the right pelvis.  Small bowel was going down into the right posterior peritoneal reflection consistent with his prior right retroperitoneal surgeries including ureter resection and right nephrectomy.  I focused on some lateral medial dissection.  Try to go medial to lateral but given his prior retroperitoneal surgery planes were not classic and somewhat oozy.  Decided to go lateral to medial at first on a more clean plane at the mid descending colon.  Rolled the left colon off the retroperitoneum and left kidney.  Followed more down to the left lower quadrant and pelvic brim.  Was able  to roll most of it to the midline to see the left ureter in its retroperitoneal position which we kept in place.  Eventually was able to help free off the rectosigmoid colon off the anterior pelvis.  Much more dense concrete adhesions to the right dome of the bladder.  I mobilized the rectosigmoid colon & elevated it to put the main pedicle on tension.  I scored the base of peritoneum of the medial side of the mesentery of the elevated left colon from the ligament of Treitz to the mid rectum.   I elevated the sigmoid mesentery and entered into the retro-mesenteric plane. We were able to identify the left ureter and gonadal vessels. We kept those posterior within the retroperitoneum and elevated the left colon mesentery off that.  Patient had dense small bowel adhesions to the right retroperitoneum and pelvis.  I was able to carefully free off interloop small bowel adhesions and adhesions to the midline retroperitoneum.  The terminal ileum was rather densely adherent to the right mid retroperitoneum.  I was able to find some interloop adhesive bands causing the closed-loop obstruction.  I freed those off and untwisted the bowel.  This allowed the small bowel to come out of the pelvis and retroperitoneum so I could focus on dissection around the colovesical fistula.   I did isolate the inferior mesenteric artery (IMA) pedicle but did not ligate it yet.  I continued distally and got into the avascular plane posterior to the mesorectum, sparing  the nervi ergentes.. This allowed me to help mobilize the rectum as well by freeing the mesorectum off the sacrum.  I stayed away from the remaining left ureter.  I kept the lateral vascular pedicles to the rectum intact.  I skeletonized the lymph nodes off the inferior mesenteric artery pedicle.  I went down to its takeoff from the aorta.  I isolated the inferior mesenteric vein off of the ligament of Treitz just cephalad to that as well.  After confirming the left ureter  was out of the way, I went ahead and ligated the inferior mesenteric artery pedicle just near its takeoff from the aorta.  I did ligate the inferior mesenteric vein in a similar fashion.  We ensured hemostasis.  I was able to come around and circumferentially free the rectosigmoid colon off the pelvis.  Gradually thinned out from his dense adhesions on the right lateral proximal rectum and mesial rectum.  Came around anteriorly and gradually thereby layer was able to transect the rectosigmoid colon off the anterior bladder wall.  Obvious inflamed dome but no exposed bladder mucosa.  I mobilized the peritoneal coverings towards the peritoneal reflection on both the right and left sides of the rectum.  I had to go down more distally to the mid rectum which was soft and noninflamed.  I skeletonized the mesorectum at the mid rectum for the distal point of resection.    I had the circular insufflate the bladder with methylene blue dyed solution.  Able to fill the bladder up for 100 mL which got good bladder distention.  Contracted on the right dome consistent with prior inflammation.  There was no leak, arguing against any major colovesical fistula.  Bladder was decompressed.  I mobilized the left colon in a lateral to medial fashion off the retroperitoneum and sidewall attachments along the line of Toldt up towards the splenic flexure to ensure good mobilization of the remaining left colon to reach into the pelvis. I chose a region at the descending/sigmoid junction that was soft and easily reached down to the rectal stump.  I transected the mesentery of the colon radially to preserve remaining colon blood supply.  His tissues were poor and quite oozy and inflamed.  Did irrigation a few liters of saline.  Area did clean up.  Did not see any active bleeding on the retroperitoneum or inferior mesenteric stumps.  Hemostasis adequate on the mid mesorectum.  We asked anesthesia to dilute the indocyanine green (ICG) to  10 mL and inject 3 mL intravenously with IV flush.  I switched to the NIR fluorescence (Firefly mode) imaging window on the daVinci platform.  I was able to see good light green visualization of blood vessels with good perfusion of tissues, confirming good tissue perfusion of both ends planned for anastomosis (distal descending colon and mid rectum).  I transected at the proximal rectum using a robotic stapler.  Green load 60 mm stapler.  Came across 90% of the first firing.  Did the final 10% on the left lateral corner.  I ran the small bowel from the terminal ileum more proximally to the ligament of Treitz and saw no bowel injury or serosal injury.  Bowel laid well with a nice open broad mesentery.  No more interloop/closed-loop trapped areas.    I created an extraction incision through a small Pfannenstiel incision in the suprapubic region.  Placed a wound protector.  I was able to eviscerate the rectosigmoid and descending colon out the wound.  I clamped the colon proximal to this area using a reusable pursestringer device.  Passed a 2-0 Keith needle. I transected at the descending/sigmoid junction with a scalpel. I got healthy bleeding mucosa.  We sent the rectosigmoid colon specimen off to go to pathology.  We sized the colon orifice.  I chose a 85mm EEA anvil stapler system.  I reinforced the prolene pursestring with interrupted silk suture.  I placed the anvil to the open end of the proximal remaining colon and closed around it using the pursestring.    We did copious irrigation with crystalloid solution.  Hemostasis was good.  The distal end of the remaining colon easily reached down to the rectal stump, therefore, splenic flexure mobilization was not needed.      Ms Lemar Lofty scrubbed down and did gentle anal dilation and advanced the EEA stapler up the rectal stump. The spike was brought out at the provimal end of the rectal stump under direct visualization.  I  attached the anvil of the proximal colon  the spike of the stapler. Anvil was tightened down and held clamped for 60 seconds. The EEA stapler was fired and held clamped for 30 seconds. The stapler was released & removed. We noted 2 excellent anastomotic rings. Blue stitch is in the proximal ring.  We irrigation of isotonic solution & held that for the pelvic air leak test .  The rectum was insufflated the rectum while clamping the colon proximal to that anastomosis.  There was a negative air leak test. There was no tension of mesentery or bowel at the anastomosis.   Tissues looked viable.  Ureters & bowel uninjured.  The anastomosis looked healthy. Greater omentum positioned down into the pelvis to help protect the anastomosis.  It cannot reach very far but was able to come to the dome of the bladder.  It has been rather inflamed with a phlegmon on the colovesical fistula and oozy, I decided to leave a drain least for the first few days.  I did rigid proctoscopy & noted the anastomosis was at 12 cm from the anal verge consistent with the proximal rectum.  Hemostasis adequate on the staple line of the EEA colorectal anastomosis.  Endoluminal gas was evacuated.  Ports & wound protector removed.  We changed gloves & redraped the patient per colon SSI prevention protocol.  We aspirated the antibiotic irrigation.  Hemostasis was good.  Sterile unused instruments were used from this point.  I closed the skin at the port sites using Monocryl stitch and sterile dressing.  I closed the extraction wound using a 0 Vicryl vertical peritoneal closure and a #1 PDS transverse anterior rectal fascial closure like a small Pfannenstiel closure. I closed the skin with some interrupted Monocryl stitches.  I placed sterile dressings.     Patient is being extubated go to recovery room. I had discussed postop care with the patient in detail the office & in the holding area. Instructions are written. I discussed operative findings, updated the patient's status, discussed  probable steps to recovery, and gave postoperative recommendations to the patient's sister, Gery Pray & his mother.  Recommendations were made.  Questions were answered.  They expressed understanding & appreciation.  [name], M.D., F.A.C.S. Gastrointestinal and Minimally Invasive [facility] [address] [place], [place] [address] [phone] Main / Paging

## 2020-03-09 NOTE — Op Note (Signed)
Preoperative diagnosis:  1. Colovesical fistula  Postoperative diagnosis:  1. Same  Procedure: 1. Cystoscopy, retrograde instillation of firefly contrast into the patient's left ureter  Surgeon: [name], MD  Anesthesia: General  Complications: None  Intraoperative findings:  1: Fistula was noted in the right posterior wall/bladder dome.  The bladder mucosa was erythematous, there were no lesions or areas concerning for urothelial cancer.  The right ureteral orifice was absent.  The left ureteral orifice was orthotopic.  EBL: Minimal  Specimens: None  Indication: Isaac [name] is a 64 y.o. patient with colovesical fistula.  After reviewing the management options for treatment, he elected to proceed with the above surgical procedure(s). We have discussed the potential benefits and risks of the procedure, side effects of the proposed treatment, the likelihood of the patient achieving the goals of the procedure, and any potential problems that might occur during the procedure or recuperation. Informed consent has been obtained.  Description of procedure:  The patient was taken to the operating room and general anesthesia was induced.  The patient was placed in the dorsal lithotomy position, prepped and draped in the usual sterile fashion, and preoperative antibiotics were administered. A preoperative time-out was performed.   21 French 0 degree cystoscope was gently passed to the patient's urethra and into the bladder under visual guidance.  Cystoscopy was then performed.  The above findings were noted.  I then used a 5 [place] abated ureteral catheter and advanced it up 20 cm into the patient's urethra and then slowly injected and pulled back simultaneously.  I injected a total of 7.5 mL into the patient's left ureter.  A Foley catheter was then placed in the patient's bladder.  The case was then turned over to Dr. Johney Maine for further management.  [name],  M.D.

## 2020-03-10 ENCOUNTER — Encounter (HOSPITAL_COMMUNITY): Payer: Self-pay | Admitting: Surgery

## 2020-03-10 LAB — CBC
HCT: 32.6 % — ABNORMAL LOW (ref 39.0–52.0)
Hemoglobin: 10.7 g/dL — ABNORMAL LOW (ref 13.0–17.0)
MCH: 31.8 pg (ref 26.0–34.0)
MCHC: 32.8 g/dL (ref 30.0–36.0)
MCV: 96.7 fL (ref 80.0–100.0)
Platelets: 110 10*3/uL — ABNORMAL LOW (ref 150–400)
RBC: 3.37 MIL/uL — ABNORMAL LOW (ref 4.22–5.81)
RDW: 13.7 % (ref 11.5–15.5)
WBC: 4.8 10*3/uL (ref 4.0–10.5)
nRBC: 0 % (ref 0.0–0.2)

## 2020-03-10 LAB — BASIC METABOLIC PANEL
Anion gap: 9 (ref 5–15)
BUN: 18 mg/dL (ref 8–23)
CO2: 21 mmol/L — ABNORMAL LOW (ref 22–32)
Calcium: 7.9 mg/dL — ABNORMAL LOW (ref 8.9–10.3)
Chloride: 106 mmol/L (ref 98–111)
Creatinine, Ser: 1.6 mg/dL — ABNORMAL HIGH (ref 0.61–1.24)
GFR, Estimated: 48 mL/min — ABNORMAL LOW (ref >60)
Glucose, Bld: 233 mg/dL — ABNORMAL HIGH (ref 70–99)
Potassium: 4.3 mmol/L (ref 3.5–5.1)
Sodium: 136 mmol/L (ref 135–145)

## 2020-03-10 LAB — MAGNESIUM: Magnesium: 1.7 mg/dL (ref 1.7–2.4)

## 2020-03-10 LAB — GLUCOSE, CAPILLARY
Glucose-Capillary: 203 mg/dL — ABNORMAL HIGH (ref 70–99)
Glucose-Capillary: 199 mg/dL — ABNORMAL HIGH (ref 70–99)
Glucose-Capillary: 246 mg/dL — ABNORMAL HIGH (ref 70–99)
Glucose-Capillary: 298 mg/dL — ABNORMAL HIGH (ref 70–99)

## 2020-03-10 NOTE — Progress Notes (Signed)
Progress Note: General Surgery Service   Chief Complaint/Subjective: Gas pains this morning, no flatulence, no nausea, tolerating liquids  Objective: Vital signs in last 24 hours: Temp:  [97.6 F (36.4 C)-98.5 F (36.9 C)] 97.8 F (36.6 C) (10/30 0624) Pulse Rate:  [64-89] 64 (10/30 0624) Resp:  [9-19] 14 (10/30 0624) BP: (101-139)/(71-92) 101/75 (10/30 0624) SpO2:  [95 %-100 %] 97 % (10/30 0624) Weight:  [95.3 kg-97.2 kg] 97.2 kg (10/30 0400) Last BM Date: 03/09/20  Intake/Output from previous day: 10/29 0701 - 10/30 0700 In: 2600 [I.V.:2600] Out: 3610 [Urine:2600; Drains:710; Blood:300] Intake/Output this shift: No intake/output data recorded.  Gen: NAD  Resp: nonlabored  Card: RRR  Abd: soft, tender to palpation around incisions, drain with serosanguinous output  Lab Results: CBC  Recent Labs    03/10/20 0355  WBC 4.8  HGB 10.7*  HCT 32.6*  PLT 110*   BMET Recent Labs    03/10/20 0355  NA 136  K 4.3  CL 106  CO2 21*  GLUCOSE 233*  BUN 18  CREATININE 1.60*  CALCIUM 7.9*   PT/INR No results for input(s): LABPROT, INR in the last 72 hours. ABG No results for input(s): PHART, HCO3 in the last 72 hours.  Invalid input(s): PCO2, PO2  Anti-infectives: Anti-infectives (From admission, onward)   Start     Dose/Rate Route Frequency Ordered Stop   03/10/20 0030  cefoTEtan (CEFOTAN) 2 g in sodium chloride 0.9 % 100 mL IVPB        2 g 200 mL/hr over 30 Minutes Intravenous Every 12 hours 03/09/20 1829 03/10/20 0121   03/09/20 2200  ciprofloxacin (CIPRO) tablet 250 mg        250 mg Oral 2 times daily 03/09/20 1829     03/09/20 2000  fluconazole (DIFLUCAN) tablet 200 mg        200 mg Oral Daily 03/09/20 1829     03/09/20 1400  neomycin (MYCIFRADIN) tablet 1,000 mg  Status:  Discontinued       "And" Linked Group Details   1,000 mg Oral 3 times per day 03/09/20 1029 03/09/20 1033   03/09/20 1400  metroNIDAZOLE (FLAGYL) tablet 1,000 mg  Status:   Discontinued       "And" Linked Group Details   1,000 mg Oral 3 times per day 03/09/20 1029 03/09/20 1033   03/09/20 1030  cefoTEtan (CEFOTAN) 2 g in sodium chloride 0.9 % 100 mL IVPB        2 g 200 mL/hr over 30 Minutes Intravenous On call to O.R. 03/09/20 1029 03/09/20 1234      Medications: Scheduled Meds: . acetaminophen  1,000 mg Oral Q6H  . alvimopan  12 mg Oral BID  . aspirin EC  81 mg Oral QHS  . Chlorhexidine Gluconate Cloth  6 each Topical Daily  . ciprofloxacin  250 mg Oral BID  . enoxaparin (LOVENOX) injection  40 mg Subcutaneous Q24H  . feeding supplement  237 mL Oral BID BM  . finasteride  5 mg Oral Daily  . fluconazole  200 mg Oral Daily  . gabapentin  200 mg Oral TID  . glimepiride  2 mg Oral BID AC  . insulin aspart  0-15 Units Subcutaneous TID WC  . insulin aspart  0-5 Units Subcutaneous QHS  . insulin glargine  40 Units Subcutaneous BID  . isosorbide mononitrate  120 mg Oral Daily  . ketoconazole  1 application Topical Daily  . lip balm  1 application Topical BID  .  metoprolol succinate  25 mg Oral QHS  . pantoprazole  40 mg Oral q morning - 10a  . rOPINIRole  2 mg Oral QHS  . rosuvastatin  10 mg Oral QODAY  . sodium chloride flush  10-40 mL Intracatheter Q12H  . tamsulosin  0.4 mg Oral Daily   Continuous Infusions: . sodium chloride    . lactated ringers 50 mL/hr at 03/09/20 2053  . methocarbamol (ROBAXIN) IV     PRN Meds:.sodium chloride, ALPRAZolam, alum & mag hydroxide-simeth, diphenhydrAMINE **OR** diphenhydrAMINE, enalaprilat, fentaNYL (SUBLIMAZE) injection, magic mouthwash, methocarbamol (ROBAXIN) IV, methocarbamol, metoCLOPramide (REGLAN) injection, metoprolol tartrate, naphazoline-glycerin, nitroGLYCERIN, ondansetron **OR** ondansetron (ZOFRAN) IV, prochlorperazine **OR** prochlorperazine, sodium chloride flush, traMADol  Assessment/Plan: s/p Procedure(s): ROBOTIC LOW ANTERIOR RECTOSIGMOID RESECTION, BILATERAL TAP BLOCK, FIREFLY POSSIBLE  OSTOMY PANENDOSCOPY RIGID CYSTOSCOPY WITH FIREFLYER  PLACEMENT 03/09/2020 -advance diet -ambulate -continue to monitor drain -foley out tomorrow    LOS: 1 day   Mickeal Skinner, MD [facility]

## 2020-03-10 NOTE — Evaluation (Signed)
Physical Therapy Evaluation Patient Details Name: Isaac [name] MRN: [id number] DOB: January 03, 1956 Today's Date: 03/10/2020   History of Present Illness  64 y.o.  h/o HTN, GERD, CKD Stage III, OSA on CPAP, CAD, DM II, bladder cancer, colovesical fistula, S/P ROBOTIC LOW ANTERIOR RECTOSIGMOID RESECTION 03/09/20.  Clinical Impression  The patient mobilized with mod assist to sitting then to recliner using RW. Patient reports pain 7 to 10 in right side with  Mobility. RN aware. Patient also expresses concern for sediment like in the foley tubing. RN notified.  Patient lives alone and will benefit from short post acute rehab prior to returning home to return to functional independence.  Pt admitted with above diagnosis.  Pt currently with functional limitations due to the deficits listed below (see PT Problem List). Pt will benefit from skilled PT to increase their independence and safety with mobility to allow discharge to the venue listed below.     Follow Up Recommendations SNF;Supervision/Assistance - 24 hour    Equipment Recommendations  None recommended by PT    Recommendations for Other Services       Precautions / Restrictions Precautions Precautions: Fall Precaution Comments: R JP      Mobility  Bed Mobility Overal bed mobility: Needs Assistance Bed Mobility: Supine to Sit     Supine to sit: Mod assist;HOB elevated     General bed mobility comments: moves very slowly, guardedly, used rail , assisted buttocks using bed pad to get to bed edge.    Transfers Overall transfer level: Needs assistance Equipment used: Rolling walker (2 wheeled) Transfers: Sit to/from [facility] Sit to Stand: Min assist Stand pivot transfers: Min assist       General transfer comment: slow to rise from raised bed. small shuffle steps to recliner, very slow to descend to recliner.  Ambulation/Gait             General Gait Details: tba  Stairs             Wheelchair Mobility    Modified Rankin (Stroke Patients Only)       Balance Overall balance assessment: Needs assistance Sitting-balance support: Bilateral upper extremity supported;Feet supported Sitting balance-Leahy Scale: Fair     Standing balance support: Bilateral upper extremity supported;During functional activity Standing balance-Leahy Scale: Fair                               Pertinent Vitals/Pain Pain Assessment: 0-10 Pain Score: 8  Pain Location: right abd Pain Descriptors / Indicators: Constant;Cramping;Guarding;Grimacing;Moaning Pain Intervention(s): Monitored during session;Premedicated before session;Patient requesting pain meds-RN notified;Limited activity within patient's tolerance    Home Living Family/patient expects to be discharged to:: Private residence Living Arrangements: Alone   Type of Home: House Home Access: Stairs to enter   Technical brewer of Steps: 1 Home Layout: One level Home Equipment: Environmental consultant - 2 wheels      Prior Function Level of Independence: Independent               Hand Dominance   Dominant Hand: Left    Extremity/Trunk Assessment   Upper Extremity Assessment Upper Extremity Assessment: Defer to OT evaluation    Lower Extremity Assessment Lower Extremity Assessment: Generalized weakness    Cervical / Trunk Assessment Cervical / Trunk Assessment:  (very guarded in moving trunk.)  Communication   Communication: No difficulties  Cognition Arousal/Alertness: Awake/alert Behavior During Therapy: WFL for tasks assessed/performed Overall Cognitive Status:  Within Functional Limits for tasks assessed                                 General Comments: pt. states anesthesia makes him forget things      General Comments      Exercises     Assessment/Plan    PT Assessment Patient needs continued PT services  PT Problem List Decreased strength;Decreased knowledge of use of  DME;Decreased range of motion;Decreased activity tolerance;Decreased safety awareness;Pain;Decreased mobility       PT Treatment Interventions DME instruction;Gait training;Functional mobility training;Therapeutic activities;Therapeutic exercise;Patient/family education    PT Goals (Current goals can be found in the Care Plan section)  Acute Rehab PT Goals Patient Stated Goal: to go to rehab PT Goal Formulation: With patient Time For Goal Achievement: 03/24/20 Potential to Achieve Goals: Good    Frequency Min 3X/week   Barriers to discharge Decreased caregiver support      Co-evaluation               AM-PAC PT "6 Clicks" Mobility  Outcome Measure Help needed turning from your back to your side while in a flat bed without using bedrails?: A Lot Help needed moving from lying on your back to sitting on the side of a flat bed without using bedrails?: A Lot Help needed moving to and from a bed to a chair (including a wheelchair)?: A Lot Help needed standing up from a chair using your arms (e.g., wheelchair or bedside chair)?: A Lot Help needed to walk in hospital room?: A Lot Help needed climbing 3-5 steps with a railing? : A Lot 6 Click Score: 12    End of Session   Activity Tolerance: Patient limited by pain Patient left: in chair;with call bell/phone within reach Nurse Communication: Mobility status PT Visit Diagnosis: Difficulty in walking, not elsewhere classified (R26.2)    Time: 4765-4650 PT Time Calculation (min) (ACUTE ONLY): 39 min   Charges:   PT Evaluation $PT Eval Moderate Complexity: 1 Mod PT Treatments $Therapeutic Activity: 23-37 mins        [place] Pager [phone] Office [phone]   Claretha Cooper 03/10/2020, 9:11 AM

## 2020-03-11 LAB — GLUCOSE, CAPILLARY
Glucose-Capillary: 86 mg/dL (ref 70–99)
Glucose-Capillary: 192 mg/dL — ABNORMAL HIGH (ref 70–99)
Glucose-Capillary: 224 mg/dL — ABNORMAL HIGH (ref 70–99)
Glucose-Capillary: 213 mg/dL — ABNORMAL HIGH (ref 70–99)

## 2020-03-11 NOTE — Progress Notes (Signed)
Progress Note: General Surgery Service   Chief Complaint/Subjective: Pain persistent and worse with movement, +BM, tolerating diet well  Objective: Vital signs in last 24 hours: Temp:  [97.8 F (36.6 C)-98.4 F (36.9 C)] 97.9 F (36.6 C) (10/31 0546) Pulse Rate:  [68-72] 68 (10/31 0546) Resp:  [20] 20 (10/31 0546) BP: (101-124)/(67-70) 101/70 (10/31 0546) SpO2:  [96 %] 96 % (10/31 0546) Weight:  [96.8 kg] 96.8 kg (10/31 0546) Last BM Date: 03/09/20  Intake/Output from previous day: 10/30 0701 - 10/31 0700 In: 1690 [P.O.:960] Out: 6080 [Urine:6050; Drains:30] Intake/Output this shift: No intake/output data recorded.  Gen: NAD  Resp: nonlabored  Card: RRR  Abd: soft, ATTP right side near incisions, drain with serosanguinous output  Lab Results: CBC  Recent Labs    03/10/20 0355  WBC 4.8  HGB 10.7*  HCT 32.6*  PLT 110*   BMET Recent Labs    03/10/20 0355  NA 136  K 4.3  CL 106  CO2 21*  GLUCOSE 233*  BUN 18  CREATININE 1.60*  CALCIUM 7.9*   PT/INR No results for input(s): LABPROT, INR in the last 72 hours. ABG No results for input(s): PHART, HCO3 in the last 72 hours.  Invalid input(s): PCO2, PO2  Anti-infectives: Anti-infectives (From admission, onward)   Start     Dose/Rate Route Frequency Ordered Stop   03/10/20 0030  cefoTEtan (CEFOTAN) 2 g in sodium chloride 0.9 % 100 mL IVPB        2 g 200 mL/hr over 30 Minutes Intravenous Every 12 hours 03/09/20 1829 03/10/20 2026   03/09/20 2200  ciprofloxacin (CIPRO) tablet 250 mg        250 mg Oral 2 times daily 03/09/20 1829     03/09/20 2000  fluconazole (DIFLUCAN) tablet 200 mg        200 mg Oral Daily 03/09/20 1829     03/09/20 1400  neomycin (MYCIFRADIN) tablet 1,000 mg  Status:  Discontinued       "And" Linked Group Details   1,000 mg Oral 3 times per day 03/09/20 1029 03/09/20 1033   03/09/20 1400  metroNIDAZOLE (FLAGYL) tablet 1,000 mg  Status:  Discontinued       "And" Linked Group Details    1,000 mg Oral 3 times per day 03/09/20 1029 03/09/20 1033   03/09/20 1030  cefoTEtan (CEFOTAN) 2 g in sodium chloride 0.9 % 100 mL IVPB        2 g 200 mL/hr over 30 Minutes Intravenous On call to O.R. 03/09/20 1029 03/09/20 1234      Medications: Scheduled Meds: . acetaminophen  1,000 mg Oral Q6H  . alvimopan  12 mg Oral BID  . aspirin EC  81 mg Oral QHS  . Chlorhexidine Gluconate Cloth  6 each Topical Daily  . ciprofloxacin  250 mg Oral BID  . enoxaparin (LOVENOX) injection  40 mg Subcutaneous Q24H  . feeding supplement  237 mL Oral BID BM  . finasteride  5 mg Oral Daily  . fluconazole  200 mg Oral Daily  . gabapentin  200 mg Oral TID  . glimepiride  2 mg Oral BID AC  . insulin aspart  0-15 Units Subcutaneous TID WC  . insulin aspart  0-5 Units Subcutaneous QHS  . insulin glargine  40 Units Subcutaneous BID  . isosorbide mononitrate  120 mg Oral Daily  . ketoconazole  1 application Topical Daily  . lip balm  1 application Topical BID  . metoprolol succinate  25 mg Oral QHS  . pantoprazole  40 mg Oral q morning - 10a  . rOPINIRole  2 mg Oral QHS  . rosuvastatin  10 mg Oral QODAY  . sodium chloride flush  10-40 mL Intracatheter Q12H  . tamsulosin  0.4 mg Oral Daily   Continuous Infusions: . sodium chloride    . methocarbamol (ROBAXIN) IV     PRN Meds:.sodium chloride, ALPRAZolam, alum & mag hydroxide-simeth, diphenhydrAMINE **OR** diphenhydrAMINE, enalaprilat, fentaNYL (SUBLIMAZE) injection, magic mouthwash, methocarbamol (ROBAXIN) IV, methocarbamol, metoCLOPramide (REGLAN) injection, metoprolol tartrate, naphazoline-glycerin, nitroGLYCERIN, ondansetron **OR** ondansetron (ZOFRAN) IV, prochlorperazine **OR** prochlorperazine, sodium chloride flush, traMADol  Assessment/Plan: s/p Procedure(s): ROBOTIC LOW ANTERIOR RECTOSIGMOID RESECTION, BILATERAL TAP BLOCK, FIREFLY POSSIBLE OSTOMY PANENDOSCOPY RIGID CYSTOSCOPY WITH FIREFLYER  PLACEMENT 03/09/2020 -continue  PT/ambulate -continue pain control -patient thinks he will be able to go home since he won't have drains or ostomy care    LOS: 2 days   Mickeal Skinner, MD [facility]

## 2020-03-11 NOTE — Progress Notes (Signed)
Patient's foley catheter removed per order at 0600 this AM (03/11/20), 10 ccs removed from inflation port prior to removal, catheter tip intact, patient tolerated well.

## 2020-03-11 NOTE — Evaluation (Signed)
Occupational Therapy Evaluation Patient Details Name: Isaac [name] MRN: [id number] DOB: 1955-05-30 Today's Date: 03/11/2020    History of Present Illness 64 y.o.  h/o HTN, GERD, CKD Stage III, OSA on CPAP, CAD, DM II, bladder cancer, colovesical fistula, S/P ROBOTIC LOW ANTERIOR RECTOSIGMOID RESECTION 03/09/20.   Clinical Impression   Pt admitted with the above. Pt currently with functional limitations due to the deficits listed below (see OT Problem List).  Pt will benefit from skilled OT to increase their safety and independence with ADL and functional mobility for ADL to facilitate discharge to venue listed below.     Follow Up Recommendations  Home health OT;SNF;Supervision/Assistance - 24 hour (depending on progress. pt lives alone)    Equipment Recommendations  None recommended by OT       Precautions / Restrictions Precautions Precautions: Fall Precaution Comments: R JP      Mobility Bed Mobility               General bed mobility comments: pt sitting EOB    Transfers Overall transfer level: Needs assistance Equipment used: Rolling walker (2 wheeled) Transfers: Sit to/from [facility] Sit to Stand: Mod assist Stand pivot transfers: Mod assist            Balance Overall balance assessment: Needs assistance Sitting-balance support: Bilateral upper extremity supported;Feet supported Sitting balance-Leahy Scale: Fair     Standing balance support: Bilateral upper extremity supported;During functional activity Standing balance-Leahy Scale: Fair                             ADL either performed or assessed with clinical judgement   ADL Overall ADL's : Needs assistance/impaired Eating/Feeding: NPO   Grooming: Wash/dry face;Sitting;Minimal assistance   Upper Body Bathing: Minimal assistance;Sitting   Lower Body Bathing: Sit to/from stand;Cueing for safety;Cueing for sequencing;Moderate assistance   Upper Body Dressing  : Set up;Sitting   Lower Body Dressing: Moderate assistance;Sit to/from stand;Cueing for safety;Cueing for sequencing   Toilet Transfer: Minimal assistance;Cueing for safety;Cueing for sequencing;RW;Comfort height toilet   Toileting- Clothing Manipulation and Hygiene: Moderate assistance;Sit to/from stand;Cueing for safety;Cueing for sequencing               Vision Patient Visual Report: No change from baseline              Pertinent Vitals/Pain Pain Score: 9  Pain Location: right abd Pain Descriptors / Indicators: Constant;Cramping;Guarding;Grimacing;Moaning Pain Intervention(s): Limited activity within patient's tolerance;Patient requesting pain meds-RN notified;Monitored during session;Repositioned     Hand Dominance Left   Extremity/Trunk Assessment Upper Extremity Assessment Upper Extremity Assessment: Generalized weakness           Communication Communication Communication: No difficulties   Cognition Arousal/Alertness: Awake/alert Behavior During Therapy: WFL for tasks assessed/performed Overall Cognitive Status: Within Functional Limits for tasks assessed                                                Home Living Family/patient expects to be discharged to:: Private residence Living Arrangements: Alone   Type of Home: House Home Access: Stairs to enter [employer] of Steps: 1   Home Layout: One level     Bathroom Shower/Tub: Tub/shower unit         Home Equipment: Environmental consultant - 2 wheels  Prior Functioning/Environment Level of Independence: Independent                 OT Problem List: Decreased strength;Decreased knowledge of use of DME or AE;Impaired balance (sitting and/or standing);Decreased activity tolerance      OT Treatment/Interventions: Patient/family education;Therapeutic activities;DME and/or AE instruction;Self-care/ADL training    OT Goals(Current goals can be found in the care plan  section) Acute Rehab OT Goals Patient Stated Goal: get well- i live alone OT Goal Formulation: With patient Time For Goal Achievement: 03/25/20  OT Frequency: Min 2X/week   Barriers to D/C: Decreased caregiver support             AM-PAC OT "6 Clicks" Daily Activity     Outcome Measure Help from another person eating meals?: Total Help from another person taking care of personal grooming?: A Little Help from another person toileting, which includes using toliet, bedpan, or urinal?: A Lot Help from another person bathing (including washing, rinsing, drying)?: A Lot Help from another person to put on and taking off regular upper body clothing?: A Little Help from another person to put on and taking off regular lower body clothing?: A Lot 6 Click Score: 13   End of Session Equipment Utilized During Treatment: Rolling walker Nurse Communication: Mobility status  Activity Tolerance: Patient tolerated treatment well Patient left: in chair  OT Visit Diagnosis: Unsteadiness on feet (R26.81);Other abnormalities of gait and mobility (R26.89);Muscle weakness (generalized) (M62.81)                Time: 4982-6415 OT Time Calculation (min): 13 min Charges:  OT General Charges $OT Visit: 1 Visit OT Evaluation $OT Eval Moderate Complexity: [place] [phone] Office- [phone]     Thecla Forgione, Edwena Felty D 03/11/2020, 10:25 AM

## 2020-03-12 ENCOUNTER — Telehealth: Payer: Self-pay

## 2020-03-12 ENCOUNTER — Telehealth: Payer: Self-pay | Admitting: *Deleted

## 2020-03-12 ENCOUNTER — Other Ambulatory Visit: Payer: Medicare Other | Admitting: Internal Medicine

## 2020-03-12 ENCOUNTER — Other Ambulatory Visit (HOSPITAL_COMMUNITY): Payer: Self-pay | Admitting: Surgery

## 2020-03-12 LAB — GLUCOSE, CAPILLARY
Glucose-Capillary: 93 mg/dL (ref 70–99)
Glucose-Capillary: 165 mg/dL — ABNORMAL HIGH (ref 70–99)

## 2020-03-12 MED ORDER — HEPARIN SOD (PORK) LOCK FLUSH 100 UNIT/ML IV SOLN
500.0000 [IU] | INTRAVENOUS | Status: AC | PRN
  Administered 2020-03-12: 500 [IU]

## 2020-03-12 MED ORDER — TRAMADOL HCL 50 MG PO TABS
50.0000 mg | ORAL_TABLET | Freq: Four times a day (QID) | ORAL | 0 refills | Status: DC | PRN
Start: 2020-03-12 — End: 2020-06-19

## 2020-03-12 MED FILL — traMADol HCL 50 MG TABS: 50 | 4 days supply | Qty: 30 | Fill #0

## 2020-03-12 NOTE — Progress Notes (Signed)
RN reviewed discharge paperwork with pt and his step mom. All questions addressed. Pt incision sites are intact. No drainage noted on gauze dressing where JP drain was pulled. VSS. Pt left in stable condition with rolling walker for home. IV removed. No other needs at this time.

## 2020-03-12 NOTE — Telephone Encounter (Signed)
Attempted to reach pt. With follow-up call following endoscopic procedure 03/08/2020.  LM on pt.'s girlfriend's voice mail.  Will try to reach pt. Again later today.

## 2020-03-12 NOTE — TOC Transition Note (Signed)
Transition of Care [facility]) - CM/SW Discharge Note   Patient Details  Name: Isaac [name] MRN: [id number] Date of Birth: [birth year]  Transition of Care [facility] CM/SW Contact:  Ross Ludwig, LCSW Phone Number: 03/12/2020, 12:09 PM   Clinical Narrative:     CSW spoke to patient to discuss home health agency choice, and he stated he had [facility] in the past and would like to have them again.  CSW spoke to [place] at [facility], and he said they are able to accept patient.  CSW was also informed that he needs a rolling walker, CSW contacted [facility] and spoke with Freda Munro, she said they can deliver the rolling walker before patient leaves.  Patient will be going home with home health through [facility].  CSW signing off please reconsult with any other social work needs, home health agency has been notified of planned discharge for today.   Final next level of care: [place] Barriers to Discharge: Barriers Resolved   Patient Goals and CMS Choice Patient states their goals for this hospitalization and ongoing recovery are:: To return home with home health services. CMS Medicare.gov Compare Post Acute Care list provided to:: Patient Choice offered to / list presented to : Patient  Discharge Placement  Patient discharging home with home health.                     Discharge Plan and Services                DME Arranged: Walker rolling DME Agency: [facility] Date DME Agency Contacted: 03/12/20 Time DME Agency Contacted: [id number] Representative spoke with at DME Agency: Freda Munro HH Arranged: OT, PT [facility] Agency: [place] Date [place]: 03/12/20 Time [place]: 1105 Representative spoke with at [place]: [place] ([place]) Interventions     Readmission Risk Interventions No flowsheet data found.

## 2020-03-12 NOTE — Discharge Instructions (Signed)
SURGERY: POST OP INSTRUCTIONS (Surgery for small bowel obstruction, colon resection, etc)   ######################################################################  EAT Gradually transition to a high fiber diet with a fiber supplement over the next few days after discharge  WALK Walk an hour a day.  Control your pain to do that.    CONTROL PAIN Control pain so that you can walk, sleep, tolerate sneezing/coughing, go up/down stairs.  HAVE A BOWEL MOVEMENT DAILY Keep your bowels regular to avoid problems.  OK to try a laxative to override constipation.  OK to use an antidairrheal to slow down diarrhea.  Call if not better after 2 tries  CALL IF YOU HAVE PROBLEMS/CONCERNS Call if you are still struggling despite following these instructions. Call if you have concerns not answered by these instructions  ######################################################################   DIET Follow a light diet the first few days at home.  Start with a bland diet such as soups, liquids, starchy foods, low fat foods, etc.  If you feel full, bloated, or constipated, stay on a ful liquid or pureed/blenderized diet for a few days until you feel better and no longer constipated. Be sure to drink plenty of fluids every day to avoid getting dehydrated (feeling dizzy, not urinating, etc.). Gradually add a fiber supplement to your diet over the next week.  Gradually get back to a regular solid diet.  Avoid fast food or heavy meals the first week as you are more likely to get nauseated. It is expected for your digestive tract to need a few months to get back to normal.  It is common for your bowel movements and stools to be irregular.  You will have occasional bloating and cramping that should eventually fade away.  Until you are eating solid food normally, off all pain medications, and back to regular activities; your bowels will not be normal. Focus on eating a low-fat, high fiber diet the rest of your life  (See Getting to Good Bowel Health, below).  CARE of your INCISION or WOUND It is good for closed incision and even open wounds to be washed every day.  Shower every day.  Short baths are fine.  Wash the incisions and wounds clean with soap & water.    If you have a closed incision(s), wash the incision with soap & water every day.  You may leave closed incisions open to air if it is dry.   You may cover the incision with clean gauze & replace it after your daily shower for comfort. If you have skin tapes (Steristrips) or skin glue (Dermabond) on your incision, leave them in place.  They will fall off on their own like a scab.  You may trim any edges that curl up with clean scissors.  If you have staples, set up an appointment for them to be removed in the office in 10 days after surgery.  If you have a drain, wash around the skin exit site with soap & water and place a new dressing of gauze or band aid around the skin every day.  Keep the drain site clean & dry.    If you have an open wound with packing, see wound care instructions.  In general, it is encouraged that you remove your dressing and packing, shower with soap & water, and replace your dressing once a day.  Pack the wound with clean gauze moistened with normal (0.9%) saline to keep the wound moist & uninfected.  Pressure on the dressing for 30 minutes will stop most wound   bleeding.  Eventually your body will heal & pull the open wound closed over the next few months.  Raw open wounds will occasionally bleed or secrete yellow drainage until it heals closed.  Drain sites will drain a little until the drain is removed.  Even closed incisions can have mild bleeding or drainage the first few days until the skin edges scab over & seal.   If you have an open wound with a wound vac, see wound vac care instructions.     ACTIVITIES as tolerated Start light daily activities --- self-care, walking, climbing stairs-- beginning the day after surgery.   Gradually increase activities as tolerated.  Control your pain to be active.  Stop when you are tired.  Ideally, walk several times a day, eventually an hour a day.   Most people are back to most day-to-day activities in a few weeks.  It takes 4-8 weeks to get back to unrestricted, intense activity. If you can walk 30 minutes without difficulty, it is safe to try more intense activity such as jogging, treadmill, bicycling, low-impact aerobics, swimming, etc. Save the most intensive and strenuous activity for last (Usually 4-8 weeks after surgery) such as sit-ups, heavy lifting, contact sports, etc.  Refrain from any intense heavy lifting or straining until you are off narcotics for pain control.  You will have off days, but things should improve week-by-week. DO NOT PUSH THROUGH PAIN.  Let pain be your guide: If it hurts to do something, don't do it.  Pain is your body warning you to avoid that activity for another week until the pain goes down. You may drive when you are no longer taking narcotic prescription pain medication, you can comfortably wear a seatbelt, and you can safely make sudden turns/stops to protect yourself without hesitating due to pain. You may have sexual intercourse when it is comfortable. If it hurts to do something, stop.  MEDICATIONS Take your usually prescribed home medications unless otherwise directed.   Blood thinners:  Usually you can restart any strong blood thinners after the second postoperative day.  It is OK to take aspirin right away.     If you are on strong blood thinners (warfarin/Coumadin, Plavix, Xerelto, Eliquis, Pradaxa, etc), discuss with your surgeon, medicine PCP, and/or cardiologist for instructions on when to restart the blood thinner & if blood monitoring is needed (PT/INR blood check, etc).     PAIN CONTROL Pain after surgery or related to activity is often due to strain/injury to muscle, tendon, nerves and/or incisions.  This pain is usually  short-term and will improve in a few months.  To help speed the process of healing and to get back to regular activity more quickly, DO THE FOLLOWING THINGS TOGETHER: 1. Increase activity gradually.  DO NOT PUSH THROUGH PAIN 2. Use Ice and/or Heat 3. Try Gentle Massage and/or Stretching 4. Take over the counter pain medication 5. Take Narcotic prescription pain medication for more severe pain  Good pain control = faster recovery.  It is better to take more medicine to be more active than to stay in bed all day to avoid medications. 1.  Increase activity gradually Avoid heavy lifting at first, then increase to lifting as tolerated over the next 6 weeks. Do not "push through" the pain.  Listen to your body and avoid positions and maneuvers than reproduce the pain.  Wait a few days before trying something more intense Walking an hour a day is encouraged to help your body recover faster   and more safely.  Start slowly and stop when getting sore.  If you can walk 30 minutes without stopping or pain, you can try more intense activity (running, jogging, aerobics, cycling, swimming, treadmill, sex, sports, weightlifting, etc.) Remember: If it hurts to do it, then don't do it! 2. Use Ice and/or Heat You will have swelling and bruising around the incisions.  This will take several weeks to resolve. Ice packs or heating pads (6-8 times a day, 30-60 minutes at a time) will help sooth soreness & bruising. Some people prefer to use ice alone, heat alone, or alternate between ice & heat.  Experiment and see what works best for you.  Consider trying ice for the first few days to help decrease swelling and bruising; then, switch to heat to help relax sore spots and speed recovery. Shower every day.  Short baths are fine.  It feels good!  Keep the incisions and wounds clean with soap & water.   3. Try Gentle Massage and/or Stretching Massage at the area of pain many times a day Stop if you feel pain - do not  overdo it 4. Take over the counter pain medication This helps the muscle and nerve tissues become less irritable and calm down faster Choose ONE of the following over-the-counter anti-inflammatory medications: Acetaminophen 500mg tabs (Tylenol) 1-2 pills with every meal and just before bedtime (avoid if you have liver problems or if you have acetaminophen in you narcotic prescription) Naproxen 220mg tabs (ex. Aleve, Naprosyn) 1-2 pills twice a day (avoid if you have kidney, stomach, IBD, or bleeding problems) Ibuprofen 200mg tabs (ex. Advil, Motrin) 3-4 pills with every meal and just before bedtime (avoid if you have kidney, stomach, IBD, or bleeding problems) Take with food/snack several times a day as directed for at least 2 weeks to help keep pain / soreness down & more manageable. 5. Take Narcotic prescription pain medication for more severe pain A prescription for strong pain control is often given to you upon discharge (for example: oxycodone/Percocet, hydrocodone/Norco/Vicodin, or tramadol/Ultram) Take your pain medication as prescribed. Be mindful that most narcotic prescriptions contain Tylenol (acetaminophen) as well - avoid taking too much Tylenol. If you are having problems/concerns with the prescription medicine (does not control pain, nausea, vomiting, rash, itching, etc.), please call us [phone] to see if we need to switch you to a different pain medicine that will work better for you and/or control your side effects better. If you need a refill on your pain medication, you must call the office before 4 pm and on weekdays only.  By federal law, prescriptions for narcotics cannot be called into a pharmacy.  They must be filled out on paper & picked up from our office by the patient or authorized caretaker.  Prescriptions cannot be filled after 4 pm nor on weekends.    WHEN TO CALL US [phone] Severe uncontrolled or worsening pain  Fever over 101 F (38.5 C) Concerns with  the incision: Worsening pain, redness, rash/hives, swelling, bleeding, or drainage Reactions / problems with new medications (itching, rash, hives, nausea, etc.) Nausea and/or vomiting Difficulty urinating Difficulty breathing Worsening fatigue, dizziness, lightheadedness, blurred vision Other concerns If you are not getting better after two weeks or are noticing you are getting worse, contact our office [phone] for further advice.  We may need to adjust your medications, re-evaluate you in the office, send you to the emergency room, or see what other things we can do to help. The   clinic staff is available to answer your questions during regular business hours (8:30am-5pm).  Please don't hesitate to call and ask to speak to one of our nurses for clinical concerns.    A surgeon from [facility] is always on call at the hospitals 24 hours/day If you have a medical emergency, go to the nearest emergency room or call 911.  FOLLOW UP in our office One the day of your discharge from the hospital (or the next business weekday), please call [facility] to set up or confirm an appointment to see your surgeon in the office for a follow-up appointment.  Usually it is 2-3 weeks after your surgery.   If you have skin staples at your incision(s), let the office know so we can set up a time in the office for the nurse to remove them (usually around 10 days after surgery). Make sure that you call for appointments the day of discharge (or the next business weekday) from the hospital to ensure a convenient appointment time. IF YOU HAVE DISABILITY OR FAMILY LEAVE FORMS, BRING THEM TO THE OFFICE FOR PROCESSING.  DO NOT GIVE THEM TO YOUR DOCTOR.  [facility] [address], [address], [place], [place]  [address] ? [phone] - Main [phone] - Toll Free,  [phone] - Fax [email or web address]  GETTING TO GOOD BOWEL HEALTH. It is  expected for your digestive tract to need a few months to get back to normal.  It is common for your bowel movements and stools to be irregular.  You will have occasional bloating and cramping that should eventually fade away.  Until you are eating solid food normally, off all pain medications, and back to regular activities; your bowels will not be normal.   Avoiding constipation The goal: ONE SOFT BOWEL MOVEMENT A DAY!    Drink plenty of fluids.  Choose water first. TAKE A FIBER SUPPLEMENT EVERY DAY THE REST OF YOUR LIFE During your first week back home, gradually add back a fiber supplement every day Experiment which form you can tolerate.   There are many forms such as powders, tablets, wafers, gummies, etc Psyllium bran (Metamucil), methylcellulose (Citrucel), Miralax or Glycolax, Benefiber, Flax Seed.  Adjust the dose week-by-week (1/2 dose/day to 6 doses a day) until you are moving your bowels 1-2 times a day.  Cut back the dose or try a different fiber product if it is giving you problems such as diarrhea or bloating. Sometimes a laxative is needed to help jump-start bowels if constipated until the fiber supplement can help regulate your bowels.  If you are tolerating eating & you are farting, it is okay to try a gentle laxative such as double dose MiraLax, prune juice, or Milk of Magnesia.  Avoid using laxatives too often. Stool softeners can sometimes help counteract the constipating effects of narcotic pain medicines.  It can also cause diarrhea, so avoid using for too long. If you are still constipated despite taking fiber daily, eating solids, and a few doses of laxatives, call our office. Controlling diarrhea Try drinking liquids and eating bland foods for a few days to avoid stressing your intestines further. Avoid dairy products (especially milk & ice cream) for a short time.  The intestines often can lose the ability to digest lactose when stressed. Avoid foods that cause gassiness or  bloating.  Typical foods include beans and other legumes, cabbage, broccoli, and dairy foods.  Avoid greasy, spicy, fast foods.  Every person has   some sensitivity to other foods, so listen to your body and avoid those foods that trigger problems for you. Probiotics (such as active yogurt, Align, etc) may help repopulate the intestines and colon with normal bacteria and calm down a sensitive digestive tract Adding a fiber supplement gradually can help thicken stools by absorbing excess fluid and retrain the intestines to act more normally.  Slowly increase the dose over a few weeks.  Too much fiber too soon can backfire and cause cramping & bloating. It is okay to try and slow down diarrhea with a few doses of antidiarrheal medicines.   Bismuth subsalicylate (ex. Kayopectate, Pepto Bismol) for a few doses can help control diarrhea.  Avoid if pregnant.   Loperamide (Imodium) can slow down diarrhea.  Start with one tablet (2mg) first.  Avoid if you are having fevers or severe pain.  ILEOSTOMY PATIENTS WILL HAVE CHRONIC DIARRHEA since their colon is not in use.    Drink plenty of liquids.  You will need to drink even more glasses of water/liquid a day to avoid getting dehydrated. Record output from your ileostomy.  Expect to empty the bag every 3-4 hours at first.  Most people with a permanent ileostomy empty their bag 4-6 times at the least.   Use antidiarrheal medicine (especially Imodium) several times a day to avoid getting dehydrated.  Start with a dose at bedtime & breakfast.  Adjust up or down as needed.  Increase antidiarrheal medications as directed to avoid emptying the bag more than 8 times a day (every 3 hours). Work with your wound ostomy nurse to learn care for your ostomy.  See ostomy care instructions. TROUBLESHOOTING IRREGULAR BOWELS 1) Start with a soft & bland diet. No spicy, greasy, or fried foods.  2) Avoid gluten/wheat or dairy products from diet to see if symptoms improve. 3) Miralax  17gm or flax seed mixed in 8oz. water or juice-daily. May use 2-4 times a day as needed. 4) Gas-X, Phazyme, etc. as needed for gas & bloating.  5) Prilosec (omeprazole) over-the-counter as needed 6)  Consider probiotics (Align, Activa, etc) to help calm the bowels down  Call your doctor if you are getting worse or not getting better.  Sometimes further testing (cultures, endoscopy, X-ray studies, CT scans, bloodwork, etc.) may be needed to help diagnose and treat the cause of the diarrhea. [facility] [address], [address], [place], [place]  [address] [phone] - Main.    [phone]  - Toll Free.   [phone] - Fax [email or web address]    High-Fiber Diet Fiber, also called dietary fiber, is a type of carbohydrate that is found in fruits, vegetables, whole grains, and beans. A high-fiber diet can have many health benefits. Your health care provider may recommend a high-fiber diet to help:  Prevent constipation. Fiber can make your bowel movements more regular.  Lower your cholesterol.  Relieve the following conditions: ? Swelling of veins in the anus (hemorrhoids). ? Swelling and irritation (inflammation) of specific areas of the digestive tract (uncomplicated diverticulosis). ? A problem of the large intestine (colon) that sometimes causes pain and diarrhea (irritable bowel syndrome, IBS).  Prevent overeating as part of a weight-loss plan.  Prevent heart disease, type 2 diabetes, and certain cancers. What is my plan? The recommended daily fiber intake in grams (g) includes:  38 g for men age 50 or younger.  30 g for men over age 50.  25 g for women age 50 or younger.    21 g for women over age 50. You can get the recommended daily intake of dietary fiber by:  Eating a variety of fruits, vegetables, grains, and beans.  Taking a fiber supplement, if it is not possible to get enough fiber through your diet. What do I need to know  about a high-fiber diet?  It is better to get fiber through food sources rather than from fiber supplements. There is not a lot of research about how effective supplements are.  Always check the fiber content on the nutrition facts label of any prepackaged food. Look for foods that contain 5 g of fiber or more per serving.  Talk with a diet and nutrition specialist (dietitian) if you have questions about specific foods that are recommended or not recommended for your medical condition, especially if those foods are not listed below.  Gradually increase how much fiber you consume. If you increase your intake of dietary fiber too quickly, you may have bloating, cramping, or gas.  Drink plenty of water. Water helps you to digest fiber. What are tips for following this plan?  Eat a wide variety of high-fiber foods.  Make sure that half of the grains that you eat each day are whole grains.  Eat breads and cereals that are made with whole-grain flour instead of refined flour or white flour.  Eat brown rice, bulgur wheat, or millet instead of white rice.  Start the day with a breakfast that is high in fiber, such as a cereal that contains 5 g of fiber or more per serving.  Use beans in place of meat in soups, salads, and pasta dishes.  Eat high-fiber snacks, such as berries, raw vegetables, nuts, and popcorn.  Choose whole fruits and vegetables instead of processed forms like juice or sauce. What foods can I eat?  Fruits Berries. Pears. Apples. Oranges. Avocado. Prunes and raisins. Dried figs. Vegetables Sweet potatoes. Spinach. Kale. Artichokes. Cabbage. Broccoli. Cauliflower. Green peas. Carrots. Squash. Grains Whole-grain breads. Multigrain cereal. Oats and oatmeal. Brown rice. Barley. Bulgur wheat. Millet. Quinoa. Bran muffins. Popcorn. Rye wafer crackers. Meats and other proteins Navy, kidney, and pinto beans. Soybeans. Split peas. Lentils. Nuts and seeds. Dairy Fiber-fortified  yogurt. Beverages Fiber-fortified soy milk. Fiber-fortified orange juice. Other foods Fiber bars. The items listed above may not be a complete list of recommended foods and beverages. Contact a dietitian for more options. What foods are not recommended? Fruits Fruit juice. Cooked, strained fruit. Vegetables Fried potatoes. Canned vegetables. Well-cooked vegetables. Grains White bread. Pasta made with refined flour. White rice. Meats and other proteins Fatty cuts of meat. Fried chicken or fried fish. Dairy Milk. Yogurt. Cream cheese. Sour cream. Fats and oils Butters. Beverages Soft drinks. Other foods Cakes and pastries. The items listed above may not be a complete list of foods and beverages to avoid. Contact a dietitian for more information. Summary  Fiber is a type of carbohydrate. It is found in fruits, vegetables, whole grains, and beans.  There are many health benefits of eating a high-fiber diet, such as preventing constipation, lowering blood cholesterol, helping with weight loss, and reducing your risk of heart disease, diabetes, and certain cancers.  Gradually increase your intake of fiber. Increasing too fast can result in cramping, bloating, and gas. Drink plenty of water while you increase your fiber.  The best sources of fiber include whole fruits and vegetables, whole grains, nuts, seeds, and beans. This information is not intended to replace advice given to you by your health   care provider. Make sure you discuss any questions you have with your health care provider. Document Revised: 03/02/2017 Document Reviewed: 03/02/2017 [employer] Patient Education  2020 [employer]  

## 2020-03-12 NOTE — Discharge Summary (Signed)
Physician Discharge Summary    Patient ID: Isaac [name]: [id number] DOB/AGE: Mar 02, 1956  64 y.o.  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/2020 Active Problems:   Anxiety and depression   Hyperlipidemia   RESTLESS LEGS SYNDROME   Essential hypertension   Gout   Peripheral neuropathy   OSA on CPAP   GERD (gastroesophageal reflux disease)   Type 2 diabetes mellitus (HCC)   Bladder cancer ([place])   Non-ST elevation MI (NSTEMI) ([place])   IDA (iron deficiency anemia)   3 Days Post-Op  03/09/2020  POST-OPERATIVE DIAGNOSIS:  COLOVESICAL FISTULA  PROCEDURE:   ROBOTIC LOW ANTERIOR RECTOSIGMOID RESECTION BILATERAL TAP BLOCK,  ASSESSMENT OF TISSUE PERFUSION USING FIREFLY IMMUNOFLUORESENCE ENDOSCOPY RIGID  SURGEON:  [name], MD  OR FINDINGS:  Patient had somewhat redundant sigmoid colon with very dense adhesions to the right dome of the bladder consistent with Exam, CAT scan and cystoscopic findings of colovesical fistula.  No active opening on the bladder at this time so more aggressive bladder repair not done.  No obvious metastatic disease on visceral parietal peritoneum or liver.  The anastomosis rests 12 cm from the anal verge by rigid proctoscopy.  It is a 92 EEA colorectal stapled anastomosis distal to the bladder dome  Consults: PT/OT  Hospital Course:   Patient with history of prior right-sided nephrectomy and ureteral surgery for transitional cell cancer.  Found to have diverticulitis with  colovesical fistula by symptoms and CT scan.  Surgery recommended.  The patient underwent the surgery above.  Postoperatively, the patient gradually mobilized and advanced to a solid diet.  Pain and other symptoms were treated aggressively.    By the time of discharge, the patient was walking well the hallways, eating food, having flatus.  Pain was well-controlled on an oral medications.  Based on meeting discharge criteria and continuing to recover, I felt it was safe for the patient to be discharged from the hospital to further recover with close followup.  Initially had concerns about being by himself at home.  However with all drains and Foley removed and no ostomy, he felt more comfortable going home with help from friends and neighbors.  We will arrange home health physical occupational therapy for safety visit evaluation to make sure he continues to recover safely.  Postoperative recommendations were discussed in detail.  They are written as well.  Discharged Condition: good  Discharge Exam: Blood pressure 108/75, pulse 67, temperature 98.6 F (37 C), resp. rate 18, height 5\' 9"  (1.753 m), weight 97.8 kg, SpO2 97 %.  General: Pt awake/alert/oriented x4 in No acute distress Eyes: PERRL, normal EOM.  Sclera clear.  No icterus Neuro: CN II-XII intact w/o focal sensory/motor deficits. Lymph: No head/neck/groin lymphadenopathy Psych:  No delerium/psychosis/paranoia HENT: Normocephalic, Mucus membranes moist.  No thrush Neck: Supple, No tracheal deviation Chest: No chest wall pain w good excursion CV:  Pulses intact.  Regular rhythm MS: Normal AROM mjr joints.  No obvious deformity Abdomen: Soft.  Nondistended.  Mildly tender at incisions only.  No evidence of peritonitis.  No incarcerated  hernias. Ext:  SCDs BLE.  No mjr edema.  No cyanosis Skin: No petechiae / purpura   Disposition:    Follow-up Information    Isaac Boston, MD. Schedule an appointment as soon as possible for a visit  in 3 weeks.   Specialty: General Surgery Why: To follow up after your operation, To follow up after your hospital stay Contact information: [place] [address] [phone]               Discharge disposition: 01-Home or Self Care       Discharge Instructions    Call MD for:   Complete by: As directed    FEVER > 101.5 F  (temperatures < 101.5 F are not significant)   Call MD for:  extreme fatigue   Complete by: As directed    Call MD for:  persistant dizziness or light-headedness   Complete by: As directed    Call MD for:  persistant nausea and vomiting   Complete by: As directed    Call MD for:  redness, tenderness, or signs of infection (pain, swelling, redness, odor or green/yellow discharge around incision site)   Complete by: As directed    Call MD for:  severe uncontrolled pain   Complete by: As directed    Diet - low sodium heart healthy   Complete by: As directed    Start with a bland diet such as soups, liquids, starchy foods, low fat foods, etc. the first few days at home. Gradually advance to a solid, low-fat, high fiber diet by the end of the first week at home.   Add a fiber supplement to your diet (Metamucil, etc) If you feel full, bloated, or constipated, stay on a full liquid or pureed/blenderized diet for a few days until you feel better and are no longer constipated.   Discharge instructions   Complete by: As directed    See Discharge Instructions If you are not getting better after two weeks or are noticing you are getting worse, contact our office [phone] for further advice.  We may need to adjust your medications, re-evaluate you in the office, send you to the emergency room, or see what other things we can do to help. The clinic staff is available to answer your questions during regular business hours (8:30am-5pm).  Please don't hesitate to call and ask to speak to one of our nurses for clinical concerns.    A  surgeon from [facility] is always on call at the hospitals 24 hours/day If you have a medical emergency, go to the nearest emergency room or call 911.   Discharge wound care:   Complete by: As directed    It is good for closed incisions and even open wounds to be washed every day.  Shower every day.  Short baths are fine.  Wash the incisions and wounds clean with soap & water.    You may leave closed incisions open to air if it is dry.   You may cover the incision with clean gauze & replace it after your daily shower for comfort.   Driving Restrictions   Complete by: As directed    You may drive when: - you are no longer taking narcotic prescription pain medication - you can comfortably wear a seatbelt - you can safely make sudden turns/stops without pain.   Increase activity slowly   Complete by: As directed    Start light daily activities ---  self-care, walking, climbing stairs- beginning the day after surgery.  Gradually increase activities as tolerated.  Control your pain to be active.  Stop when you are tired.  Ideally, walk several times a day, eventually an hour a day.   Most people are back to most day-to-day activities in a few weeks.  It takes 4-6 weeks to get back to unrestricted, intense activity. If you can walk 30 minutes without difficulty, it is safe to try more intense activity such as jogging, treadmill, bicycling, low-impact aerobics, swimming, etc. Save the most intensive and strenuous activity for last (Usually 4-8 weeks after surgery) such as sit-ups, heavy lifting, contact sports, etc.  Refrain from any intense heavy lifting or straining until you are off narcotics for pain control.  You will have off days, but things should improve week-by-week. DO NOT PUSH THROUGH PAIN.  Let pain be your guide: If it hurts to do something, don't do it.   Lifting restrictions   Complete by: As directed    If you can walk 30 minutes without difficulty, it is safe to try more  intense activity such as jogging, treadmill, bicycling, low-impact aerobics, swimming, etc. Save the most intensive and strenuous activity for last (Usually 4-8 weeks after surgery) such as sit-ups, heavy lifting, contact sports, etc.   Refrain from any intense heavy lifting or straining until you are off narcotics for pain control.  You will have off days, but things should improve week-by-week. DO NOT PUSH THROUGH PAIN.  Let pain be your guide: If it hurts to do something, don't do it.  Pain is your body warning you to avoid that activity for another week until the pain goes down.   May shower / Bathe   Complete by: As directed    May walk up steps   Complete by: As directed    Remove dressing in 72 hours   Complete by: As directed    Make sure all dressings are removed by the third day after surgery.  Leave incisions open to air.  OK to cover incisions with gauze or bandages as desired   Sexual Activity Restrictions   Complete by: As directed    You may have sexual intercourse when it is comfortable. If it hurts to do something, stop.      Allergies as of 03/12/2020      Reactions   Hydrocodone Itching, Nausea And Vomiting   Dilaudid [hydromorphone] Nausea And Vomiting   Nsaids    Has 1 kidney left      Medication List    TAKE these medications   acetaminophen 500 MG tablet Commonly known as: TYLENOL Take 500 mg by mouth every 6 (six) hours as needed for moderate pain or headache.   ALPRAZolam 1 MG tablet Commonly known as: XANAX Take 1 tablet (1 mg total) by mouth 3 (three) times daily as needed for anxiety.   aspirin EC 81 MG tablet Take 81 mg by mouth at bedtime.   ciclopirox 8 % solution Commonly known as: PENLAC Apply 1 application topically at bedtime as needed (toenail fungus). APPLY ON NAIL AND SURROUNDING SKIN OVER PREVIOUS COAT. AFTER 7 DAYS REMOVE WITH ALCOHOL AND REPEAT CYCLE.   ciprofloxacin 250 MG tablet Commonly known as: CIPRO Take 250 mg by mouth 2  (two) times daily.   diazepam 10 MG tablet Commonly known as: VALIUM 10 mg. Insert 10 mg rectally at night as needed bowel relaxation   finasteride 5 MG tablet Commonly known as: PROSCAR Take  5 mg by mouth daily.   fluconazole 200 MG tablet Commonly known as: DIFLUCAN Take 200 mg by mouth daily.   glimepiride 2 MG tablet Commonly known as: AMARYL TAKE 1 BY MOUTH DAILY BEFORE SUPPER What changed:   how much to take  how to take this  when to take this  additional instructions   isosorbide mononitrate 120 MG 24 hr tablet Commonly known as: IMDUR TAKE 1 TABLET BY MOUTH DAILY What changed:   how much to take  how to take this  when to take this  additional instructions   ketoconazole 2 % cream Commonly known as: NIZORAL Apply 1 application topically daily.   metoprolol succinate 25 MG 24 hr tablet Commonly known as: TOPROL-XL TAKE 1 TABLET BY MOUTH AT BEDTIME. GENERIC EQUIVALENT FOR TOPROL-XL What changed:   how much to take  how to take this  when to take this  additional instructions   nitroGLYCERIN 0.4 MG SL tablet Commonly known as: NITROSTAT Place 1 tablet (0.4 mg total) under the tongue every 5 (five) minutes as needed for chest pain. Repeat every 5 minute up to 3 doses if no relief call 911.   ondansetron 4 MG tablet Commonly known as: ZOFRAN Take 4 mg by mouth every 8 (eight) hours as needed for nausea/vomiting.   pantoprazole 40 MG tablet Commonly known as: PROTONIX Take 1 tablet (40 mg total) by mouth every morning.   PRESCRIPTION MEDICATION Inhale into the lungs at bedtime. CPAP   rOPINIRole 2 MG tablet Commonly known as: REQUIP TAKE 1 TABLET BY MOUTH AT BEDTIME   rosuvastatin 10 MG tablet Commonly known as: CRESTOR TAKE 1 TAB (10 MG) BY MOUTH EVERY OTHER DAY What changed:   how much to take  how to take this  when to take this  additional instructions   tamsulosin 0.4 MG Caps capsule Commonly known as: FLOMAX Take 0.4  mg by mouth daily.   Toujeo SoloStar 300 UNIT/ML Solostar Pen Generic drug: insulin glargine (1 Unit Dial) Inject 40 Units into the skin 2 (two) times daily.   traMADol 50 MG tablet Commonly known as: ULTRAM Take 1-2 tablets (50-100 mg total) by mouth every 6 (six) hours as needed for moderate pain or severe pain.   Victoza 18 MG/3ML Sopn Generic drug: liraglutide Inject 0.6 mg into the skin daily.            Discharge Care Instructions  (From admission, onward)         Start     Ordered   03/12/20 0000  Discharge wound care:       Comments: It is good for closed incisions and even open wounds to be washed every day.  Shower every day.  Short baths are fine.  Wash the incisions and wounds clean with soap & water.    You may leave closed incisions open to air if it is dry.   You may cover the incision with clean gauze & replace it after your daily shower for comfort.   03/12/20 0825          Significant Diagnostic Studies:  Results for orders placed or performed during the hospital encounter of 03/09/20 (from the past 72 hour(s))  Glucose, capillary     Status: None   Collection Time: 03/09/20 11:23 AM  Result Value Ref Range   Glucose-Capillary 87 70 - 99 mg/dL    Comment: Glucose reference range applies only to samples taken after fasting for at least 8 hours.  Glucose, capillary     Status: Abnormal   Collection Time: 03/09/20  4:57 PM  Result Value Ref Range   Glucose-Capillary 140 (H) 70 - 99 mg/dL    Comment: Glucose reference range applies only to samples taken after fasting for at least 8 hours.  Glucose, capillary     Status: Abnormal   Collection Time: 03/09/20  9:30 PM  Result Value Ref Range   Glucose-Capillary 162 (H) 70 - 99 mg/dL    Comment: Glucose reference range applies only to samples taken after fasting for at least 8 hours.  Basic metabolic panel     Status: Abnormal   Collection Time: 03/10/20  3:55 AM  Result Value Ref Range   Sodium 136  135 - 145 mmol/L   Potassium 4.3 3.5 - 5.1 mmol/L   Chloride 106 98 - 111 mmol/L   CO2 21 (L) 22 - 32 mmol/L   Glucose, Bld 233 (H) 70 - 99 mg/dL    Comment: Glucose reference range applies only to samples taken after fasting for at least 8 hours.   BUN 18 8 - 23 mg/dL   Creatinine, Ser 1.60 (H) 0.61 - 1.24 mg/dL   Calcium 7.9 (L) 8.9 - 10.3 mg/dL   GFR, Estimated 48 (L) >60 mL/min    Comment: (NOTE) Calculated using the CKD-EPI Creatinine Equation (2021)    Anion gap 9 5 - 15    Comment: Performed at [facility], [place] [address] [place], [place] [address]  CBC     Status: Abnormal   Collection Time: 03/10/20  3:55 AM  Result Value Ref Range   WBC 4.8 4.0 - 10.5 K/uL   RBC 3.37 (L) 4.22 - 5.81 MIL/uL   Hemoglobin 10.7 (L) 13.0 - 17.0 g/dL   HCT 32.6 (L) 39 - 52 %   MCV 96.7 80.0 - 100.0 fL   MCH 31.8 26.0 - 34.0 pg   MCHC 32.8 30.0 - 36.0 g/dL   RDW 13.7 11.5 - 15.5 %   Platelets 110 (L) 150 - 400 K/uL    Comment: REPEATED TO VERIFY PLATELET COUNT CONFIRMED BY SMEAR SPECIMEN CHECKED FOR CLOTS Immature Platelet Fraction may be clinically indicated, consider ordering this additional test [id number]    nRBC 0.0 0.0 - 0.2 %    Comment: Performed at [facility], [place] [address], [place], [place] [address]  Magnesium     Status: None   Collection Time: 03/10/20  3:55 AM  Result Value Ref Range   Magnesium 1.7 1.7 - 2.4 mg/dL    Comment: Performed at [facility], [place] [address], [place], [place] [address]  Glucose, capillary     Status: Abnormal   Collection Time: 03/10/20  7:47 AM  Result Value Ref Range   Glucose-Capillary 203 (H) 70 - 99 mg/dL    Comment: Glucose reference range applies only to samples taken after fasting for at least 8 hours.  Glucose, capillary     Status: Abnormal   Collection Time: 03/10/20 12:16 PM  Result Value Ref Range   Glucose-Capillary 199 (H) 70 - 99 mg/dL    Comment: Glucose reference  range applies only to samples taken after fasting for at least 8 hours.  Glucose, capillary     Status: Abnormal   Collection Time: 03/10/20  5:14 PM  Result Value Ref Range   Glucose-Capillary 246 (H) 70 - 99 mg/dL    Comment: Glucose reference range applies only to samples taken after  fasting for at least 8 hours.  Glucose, capillary     Status: Abnormal   Collection Time: 03/10/20  9:43 PM  Result Value Ref Range   Glucose-Capillary 298 (H) 70 - 99 mg/dL    Comment: Glucose reference range applies only to samples taken after fasting for at least 8 hours.  Glucose, capillary     Status: None   Collection Time: 03/11/20  7:44 AM  Result Value Ref Range   Glucose-Capillary 86 70 - 99 mg/dL    Comment: Glucose reference range applies only to samples taken after fasting for at least 8 hours.  Glucose, capillary     Status: Abnormal   Collection Time: 03/11/20 12:26 PM  Result Value Ref Range   Glucose-Capillary 192 (H) 70 - 99 mg/dL    Comment: Glucose reference range applies only to samples taken after fasting for at least 8 hours.  Glucose, capillary     Status: Abnormal   Collection Time: 03/11/20  4:49 PM  Result Value Ref Range   Glucose-Capillary 224 (H) 70 - 99 mg/dL    Comment: Glucose reference range applies only to samples taken after fasting for at least 8 hours.  Glucose, capillary     Status: Abnormal   Collection Time: 03/11/20  9:13 PM  Result Value Ref Range   Glucose-Capillary 213 (H) 70 - 99 mg/dL    Comment: Glucose reference range applies only to samples taken after fasting for at least 8 hours.    No results found.  Past Medical History:  Diagnosis Date  . Arthritis   . Bladder cancer [facility]) urologist-- dr Louis Meckel   11/ 2019 recurrent   . Cancer of renal pelvis, right [facility])    oncologist-  dr [name]--  high grade papillary urothelial carcinoma, Stage III (T3 Nx M0) -- 02-12-2017  s/p  left nephrectomy--- completed chemotherapy 07-27-2017  . Cataract   .  Chemotherapy-induced neuropathy (HCC)    feet  . CKD (chronic kidney disease) stage 3, GFR 30-59 ml/min (HCC)   . Coronary artery disease    followed by Dr. [name]; Lexiscan Myoview (10/14):  Low risk, EF 57%, small inf infarct  . Coronary vasospasm ([place]) PER DR ROSS NOTE 03-22-2012--  INTER. TIGHTNESS   PER PT PROBABLE FROM STRESS  . Depression    no rx  . Dyslipidemia    pt unable to tolerate niaspan  . GAD (generalized anxiety disorder)   . GERD (gastroesophageal reflux disease)   . Hearing loss    per pt due to chemotherapy  . History of angina CHRONIC -- CONTROLLED W/ IMDUR  . History of cancer chemotherapy    completed 07-27-2017  for renal carcinoma  . 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 History of non-ST elevation myocardial infarction (NSTEMI) 2005  . Hyperlipidemia   . Hypertension   . Myocardial infarction ([place]) 2005  . Nocturia more than twice per night   . OSA on CPAP   . Pneumonia   . Sleep apnea   . Solitary right kidney    s/p left nephrectomy 02-12-2017  . Type 2 diabetes mellitus treated with insulin [facility])    endocrinologist-- dr Steffanie Dunn ([place] in [place])  lov note in epic   . Urgency of urination   . Wears glasses   . Wisdom teeth extracted     Past Surgical History:  Procedure Laterality Date  . APPENDECTOMY  08-03-2004  . CARDIAC CATHETERIZATION  x3  last one 06-17-2007   MILD NON-OBSTRUCTIVE CAD/ NORMAL LVF/ 30% LEFT RENAL ARTERY STENOSIS  . CARDIOVASCULAR STRESS TEST  02/23/2013   Low risk nuclear study w/ small inferior wall infarct at mid and basal level with no ischemia/  normal LV function and wall motion , ef 57%  . CYSTO/ BILATERAL RETROGRADE PYELOGRAM/ RIGHT URETEROSCOPIC LASER FULGURATION URETERAL TUMOR  02-16-2008  . CYSTO/ BLADDER BX  09-13-2008;  08-13-2007; 04-19-2007; 06-01-2006  . CYSTO/ CYSTOGRAM/ URETEROSCOPY  02-21-2009  . CYSTO/ RESECTION BLADDER TUMOR/ LEFT RETROGRADE  PYELOGRAM/ RIGHT URETEROSCOPY  08-07-2010   TCC OF BLADDER   S/P DISTAL URETERECTOMY/ REIMPLANTATION  . CYSTO/ RIGHT URETEROSCOPY/ BX URETERAL TUMOR  10-11-2008  . CYSTOSCOPY W/ RETROGRADES  04/19/2012   Procedure: CYSTOSCOPY WITH RETROGRADE PYELOGRAM;  Surgeon: Bernestine Amass, MD;  [facility];  Service: Urology;  Laterality: Bilateral;  30 MINS Cysto, Biopsy, possible TURBT, Bilateral retrograde pyelograms with Mitomycin C instilation Post op   . CYSTOSCOPY W/ RETROGRADES Left 03/25/2018   Procedure: CYSTOSCOPY WITH RETROGRADE PYELOGRAM;  Surgeon: [name], MD;  Location[facility];  Service: Urology;  Laterality: Left;  . CYSTOSCOPY WITH BIOPSY  04/07/2011   Procedure: CYSTOSCOPY WITH BIOPSY/ RIGHT URETEROSCOPY;  Surgeon: Bernestine Amass, MD;  Location: [facility];  Service: Urology;  Laterality: N/A;    cystoscopy, cystogram, biopsy and fulgeration  . CYSTOSCOPY WITH BIOPSY  12/29/2011   Procedure: CYSTOSCOPY WITH BIOPSY;  Surgeon: Bernestine Amass, MD;  Location: [facility];  Service: Urology;  Laterality: N/A;  [place]    . CYSTOSCOPY WITH RETROGRADE PYELOGRAM, URETEROSCOPY AND STENT PLACEMENT Left 04/28/2018   Procedure: LEFT RETROGRADE PYELOGRAM, DIAGNOSTIC LEFT URETEROSCOPY;  Surgeon: [name], MD;  Location: [facility];  Service: Urology;  Laterality: Left;  . CYSTOSCOPY WITH STENT PLACEMENT N/A 03/09/2020   Procedure: [place];  Surgeon: [name], MD;  Location: [facility];  Service: Urology;  Laterality: N/A;  . ELBOW SURGERY  07-07-2003   RIGHT ELBOW ARTHROSCOPY W/ OPEN RECONSTRUCTION  . IR FLUORO GUIDE PORT INSERTION RIGHT  04/01/2017  . IR US GUIDE VASC ACCESS RIGHT  04/01/2017  . KNEE ARTHROSCOPY W/ DEBRIDEMENT  02-08-2004   RIGHT KNEE  . OSTOMY N/A 03/09/2020   Procedure: POSSIBLE OSTOMY;  Surgeon: Isaac Boston, MD;  Location: [facility];   Service: General;  Laterality: N/A;  . PANENDOSCOPY N/A 03/09/2020   Procedure: PANENDOSCOPY RIGID;  Surgeon: Isaac Boston, MD;  Location: [facility];  Service: General;  Laterality: N/A;  . RHINOPLASTY W/ MODIFIED CARTILAGE GRAFT  05-17-2007   INTERNAL NASAL VALVE COLLAPSE/ OSA/ SEPTAL PERFERATION  . RIGHT DISTAL URETERECTOMY W/ REIMPLANTATION  10-30-2008   TCC OF RIGHT DISTAL URETER  . ROBOT ASSITED LAPAROSCOPIC NEPHROURETERECTOMY Right 02/12/2017   Procedure: XI ROBOT ASSITED LAPAROSCOPIC NEPHROURETERECTOMY;  Surgeon: [name], MD;  Location: [facility];  Service: Urology;  Laterality: Right;  . SHOULDER SURGERY  2005   RIGHT ROTATOR CUFF REPAIR  . TOTAL KNEE ARTHROPLASTY Left 05/18/2015  . TOTAL KNEE ARTHROPLASTY Left 05/18/2015   Procedure: TOTAL KNEE ARTHROPLASTY;  Surgeon: Earlie Server, MD;  Location: [place];  Service: Orthopedics;  Laterality: Left;  . TOTAL KNEE ARTHROPLASTY Right 03/21/2016   Procedure: TOTAL KNEE ARTHROPLASTY;  Surgeon: Earlie Server, MD;  Location: [place];  Service: Orthopedics;  Laterality: Right;  . TRANSTHORACIC ECHOCARDIOGRAM  02/03/2017   ef 83-38%, grade 1 diastolic dysfunction/  trivial PR  . TRANSURETHRAL RESECTION OF BLADDER TUMOR  01-13-2007  . TRANSURETHRAL RESECTION OF BLADDER TUMOR  04/19/2012   Procedure: TRANSURETHRAL RESECTION OF BLADDER TUMOR (TURBT);  Surgeon: Bernestine Amass, MD;  Location: [facility];  Service: Urology;  Laterality: N/A;  . TRANSURETHRAL RESECTION OF BLADDER TUMOR N/A 03/25/2018   Procedure: TRANSURETHRAL RESECTION OF BLADDER TUMOR (TURBT);  Surgeon: [name], MD;  Location: [facility];  Service: Urology;  Laterality: N/A;  . TRANSURETHRAL RESECTION OF BLADDER TUMOR N/A 04/28/2018   Procedure: TRANSURETHRAL RESECTION OF BLADDER TUMOR (TURBT);  Surgeon: [name], MD;  Location: [facility];  Service: Urology;  Laterality: N/A;  . umbillical hernia repair  2004    Social  History   Socioeconomic History  . Marital status: Single    Spouse name: Not on file  . Number of children: Not on file  . Years of education: Not on file  . Highest education level: Not on file  Occupational History  . Not on file  Tobacco Use  . Smoking status: Former Smoker    Years: 20.00    Types: Cigarettes    Quit date: 05/13/2007    Years since quitting: 12.8  . Smokeless tobacco: Never Used  Vaping Use  . Vaping Use: Never used  Substance and Sexual Activity  . Alcohol use: Not Currently    Alcohol/week: 3.0 - 4.0 standard drinks    Types: 3 - 4 Cans of beer per week  . Drug use: No  . Sexual activity: Not on file  Other Topics Concern  . Not on file  Social History Narrative  . Not on file   Social Determinants of Health   Financial Resource Strain:   . Difficulty of Paying Living Expenses: Not on file  Food Insecurity:   . Worried About Charity fundraiser in the Last Year: Not on file  . Ran Out of Food in the Last Year: Not on file  Transportation Needs:   . Lack of Transportation (Medical): Not on file  . Lack of Transportation (Non-Medical): Not on file  Physical Activity:   . Days of Exercise per Week: Not on file  . Minutes of Exercise per Session: Not on file  Stress:   . Feeling of Stress : Not on file  Social Connections:   . Frequency of Communication with Friends and Family: Not on file  . Frequency of Social Gatherings with Friends and Family: Not on file  . Attends Religious Services: Not on file  . Active Member of Clubs or Organizations: Not on file  . Attends Archivist Meetings: Not on file  . Marital Status: Not on file  Intimate Partner Violence:   . Fear of Current or Ex-Partner: Not on file  . Emotionally Abused: Not on file  . Physically Abused: Not on file  . Sexually Abused: Not on file    Family History  Problem Relation Age of Onset  . Coronary artery disease Father   . Diabetes Maternal Grandmother   . Heart  disease Paternal Grandfather   . Colon cancer Neg Hx   . Stomach cancer Neg Hx   . Esophageal cancer Neg Hx   . Rectal cancer Neg Hx     Current Facility-Administered Medications  Medication Dose Route Frequency Provider Last Rate Last Admin  . acetaminophen (TYLENOL) tablet 1,000 mg  1,000 mg Oral Lajuana Ripple, MD   1,000 mg at 03/12/20 0605  .  ALPRAZolam Duanne Moron) tablet 1 mg  1 mg Oral TID PRN Isaac Boston, MD   1 mg at 03/11/20 2137  . alum & mag hydroxide-simeth (MAALOX/MYLANTA) 200-200-20 MG/5ML suspension 30 mL  30 mL Oral Q6H PRN Isaac Boston, MD   30 mL at 03/11/20 1544  . alvimopan (ENTEREG) capsule 12 mg  12 mg Oral BID Isaac Boston, MD   12 mg at 03/11/20 2138  . aspirin EC tablet 81 mg  81 mg Oral Ardeen Fillers, MD   81 mg at 03/11/20 2138  . Chlorhexidine Gluconate Cloth 2 % PADS 6 each  6 each Topical Daily Isaac Boston, MD   6 each at 03/11/20 1828  . ciprofloxacin (CIPRO) tablet 250 mg  250 mg Oral BID Isaac Boston, MD   250 mg at 03/11/20 2137  . diphenhydrAMINE (BENADRYL) 12.5 MG/5ML elixir 12.5 mg  12.5 mg Oral Q6H PRN Isaac Boston, MD       Or  . diphenhydrAMINE (BENADRYL) injection 12.5 mg  12.5 mg Intravenous Q6H PRN Isaac Boston, MD      . enalaprilat (VASOTEC) injection 0.625-1.25 mg  0.625-1.25 mg Intravenous Q6H PRN Isaac Boston, MD      . enoxaparin (LOVENOX) injection 40 mg  40 mg Subcutaneous Q24H Isaac Boston, MD   40 mg at 03/11/20 0939  . feeding supplement (ENSURE SURGERY) liquid 237 mL  237 mL Oral BID BM Isaac Boston, MD   237 mL at 03/11/20 1500  . fentaNYL (SUBLIMAZE) injection 12.5-25 mcg  12.5-25 mcg Intravenous Q4H PRN Isaac Boston, MD   25 mcg at 03/11/20 2309  . finasteride (PROSCAR) tablet 5 mg  5 mg Oral Daily Isaac Boston, MD   5 mg at 03/11/20 0940  . fluconazole (DIFLUCAN) tablet 200 mg  200 mg Oral Daily Isaac Boston, MD   200 mg at 03/11/20 0940  . gabapentin (NEURONTIN) capsule 200 mg  200 mg Oral TID Isaac Boston, MD    200 mg at 03/11/20 2139  . glimepiride (AMARYL) tablet 2 mg  2 mg Oral BID Rogelia Mire, MD   2 mg at 03/11/20 1827  . insulin aspart (novoLOG) injection 0-15 Units  0-15 Units Subcutaneous TID WC Isaac Boston, MD   5 Units at 03/11/20 1823  . insulin aspart (novoLOG) injection 0-5 Units  0-5 Units Subcutaneous Ardeen Fillers, MD   2 Units at 03/11/20 2140  . insulin glargine (LANTUS) injection 40 Units  40 Units Subcutaneous BID Isaac Boston, MD   40 Units at 03/11/20 2140  . isosorbide mononitrate (IMDUR) 24 hr tablet 120 mg  120 mg Oral Daily Isaac Boston, MD   120 mg at 03/11/20 0940  . ketoconazole (NIZORAL) 2 % cream 1 application  1 application Topical Daily Jodeci Roarty, Remo Lipps, MD      . lip balm (CARMEX) ointment 1 application  1 application Topical BID Isaac Boston, MD   1 application at 83/38/25 2140  . magic mouthwash  15 mL Oral QID PRN Isaac Boston, MD      . methocarbamol (ROBAXIN) 1,000 mg in dextrose 5 % 100 mL IVPB  1,000 mg Intravenous Q6H PRN Isaac Boston, MD      . methocarbamol (ROBAXIN) tablet 1,000 mg  1,000 mg Oral Q6H PRN Isaac Boston, MD   1,000 mg at 03/11/20 1823  . metoCLOPramide (REGLAN) injection 5-10 mg  5-10 mg Intravenous Q8H PRN Isaac Boston, MD      . metoprolol succinate (TOPROL-XL) 24 hr tablet 25 mg  25 mg Oral Ardeen Fillers, MD   25 mg at 03/11/20 2137  . metoprolol tartrate (LOPRESSOR) injection 5 mg  5 mg Intravenous Q6H PRN Isaac Boston, MD      . naphazoline-glycerin (CLEAR EYES REDNESS) ophth solution 1-2 drop  1-2 drop Both Eyes QID PRN Isaac Boston, MD      . nitroGLYCERIN (NITROSTAT) SL tablet 0.4 mg  0.4 mg Sublingual Q5 min PRN Isaac Boston, MD      . ondansetron [facility]) tablet 4 mg  4 mg Oral Q6H PRN Isaac Boston, MD       Or  . ondansetron (ZOFRAN) injection 4 mg  4 mg Intravenous Q6H PRN Isaac Boston, MD      . pantoprazole (PROTONIX) EC tablet 40 mg  40 mg Oral q morning - Lorenza Burton, MD   40 mg at 03/11/20 0941  .  prochlorperazine (COMPAZINE) tablet 10 mg  10 mg Oral Q6H PRN Isaac Boston, MD       Or  . prochlorperazine (COMPAZINE) injection 5-10 mg  5-10 mg Intravenous Q6H PRN Isaac Boston, MD      . rOPINIRole (REQUIP) tablet 2 mg  2 mg Oral Ardeen Fillers, MD   2 mg at 03/11/20 2138  . rosuvastatin (CRESTOR) tablet 10 mg  10 mg Oral Carlis Abbott, MD   10 mg at 03/10/20 0930  . sodium chloride flush (NS) 0.9 % injection 10-40 mL  10-40 mL Intracatheter Gorden Harms, MD   10 mL at 03/11/20 2140  . sodium chloride flush (NS) 0.9 % injection 10-40 mL  10-40 mL Intracatheter PRN Isaac Boston, MD      . tamsulosin (FLOMAX) capsule 0.4 mg  0.4 mg Oral Daily Isaac Boston, MD   0.4 mg at 03/11/20 0940  . traMADol (ULTRAM) tablet 50 mg  50 mg Oral Q6H PRN Isaac Boston, MD   50 mg at 03/12/20 0605   Facility-Administered Medications Ordered in Other Encounters  Medication Dose Route Frequency Provider Last Rate Last Admin  . LORazepam (ATIVAN) injection 0.5 mg  0.5 mg Intravenous Once [place], Sarah M, NP      . sodium chloride flush (NS) 0.9 % injection 10 mL  10 mL Intravenous PRN [name], MD   10 mL at 06/08/17 1150     Allergies  Allergen Reactions  . Hydrocodone Itching and Nausea And Vomiting  . Dilaudid [Hydromorphone] Nausea And Vomiting  . Nsaids     Has 1 kidney left    Signed: Morton Peters, MD, FACS, MASCRS Gastrointestinal and Minimally Invasive Surgery  [facility] [address] [place], [place] [address] [phone] Fax [phone] Main/Paging  CONTACT INFORMATION: Weekday (9AM-5PM) concerns: Call [facility] main office at [phone] Weeknight (5PM-9AM) or Weekend/Holiday concerns: Check [email or web address] for General Surgery [facility] coverage (Please, do not use SecureChat as it is not reliable communication to operating surgeons for immediate patient care)      03/12/2020, 8:25 AM

## 2020-03-12 NOTE — Care Management Important Message (Signed)
Important Message  Patient Details IM Letter given to the Patient Name: Isaac [name] MRN: [id number] Date of Birth: Sep 14, 1955   Medicare Important Message Given:  Yes     Kerin Salen 03/12/2020, 11:04 AM

## 2020-03-12 NOTE — Telephone Encounter (Signed)
No answer for post procedure call back. Left message for patient to call with questions or concerns. 

## 2020-03-13 ENCOUNTER — Other Ambulatory Visit: Payer: Medicare Other

## 2020-03-13 ENCOUNTER — Ambulatory Visit: Payer: Medicare Other | Admitting: Hematology & Oncology

## 2020-03-13 ENCOUNTER — Other Ambulatory Visit (HOSPITAL_BASED_OUTPATIENT_CLINIC_OR_DEPARTMENT_OTHER): Payer: Self-pay | Admitting: Surgery

## 2020-03-13 LAB — SURGICAL PATHOLOGY

## 2020-03-13 MED FILL — oxyCODONE HCL 5 MG TABS: 5 | 7 days supply | Qty: 30 | Fill #0

## 2020-03-14 ENCOUNTER — Telehealth: Payer: Self-pay | Admitting: Internal Medicine

## 2020-03-14 NOTE — Telephone Encounter (Signed)
Per Corene Cornea a follow up and med reconciliation should be done by [employer] office.

## 2020-03-14 NOTE — Telephone Encounter (Signed)
[name] [phone]  Isaac [name] called to say he was released from hospital on Monday, from his recent surgery.

## 2020-03-15 ENCOUNTER — Encounter: Payer: Medicare Other | Admitting: Internal Medicine

## 2020-03-16 ENCOUNTER — Telehealth: Payer: Self-pay | Admitting: Internal Medicine

## 2020-03-16 DIAGNOSIS — G2581 Restless legs syndrome: Secondary | ICD-10-CM | POA: Diagnosis not present

## 2020-03-16 DIAGNOSIS — E785 Hyperlipidemia, unspecified: Secondary | ICD-10-CM | POA: Diagnosis not present

## 2020-03-16 DIAGNOSIS — K5780 Diverticulitis of intestine, part unspecified, with perforation and abscess without bleeding: Secondary | ICD-10-CM | POA: Diagnosis not present

## 2020-03-16 DIAGNOSIS — F32A Depression, unspecified: Secondary | ICD-10-CM | POA: Diagnosis not present

## 2020-03-16 DIAGNOSIS — I1310 Hypertensive heart and chronic kidney disease without heart failure, with stage 1 through stage 4 chronic kidney disease, or unspecified chronic kidney disease: Secondary | ICD-10-CM | POA: Diagnosis not present

## 2020-03-16 DIAGNOSIS — G4733 Obstructive sleep apnea (adult) (pediatric): Secondary | ICD-10-CM | POA: Diagnosis not present

## 2020-03-16 DIAGNOSIS — N321 Vesicointestinal fistula: Secondary | ICD-10-CM | POA: Diagnosis not present

## 2020-03-16 DIAGNOSIS — I2510 Atherosclerotic heart disease of native coronary artery without angina pectoris: Secondary | ICD-10-CM | POA: Diagnosis not present

## 2020-03-16 DIAGNOSIS — E1122 Type 2 diabetes mellitus with diabetic chronic kidney disease: Secondary | ICD-10-CM | POA: Diagnosis not present

## 2020-03-16 DIAGNOSIS — D509 Iron deficiency anemia, unspecified: Secondary | ICD-10-CM | POA: Diagnosis not present

## 2020-03-16 DIAGNOSIS — C676 Malignant neoplasm of ureteric orifice: Secondary | ICD-10-CM | POA: Diagnosis not present

## 2020-03-16 DIAGNOSIS — N183 Chronic kidney disease, stage 3 unspecified: Secondary | ICD-10-CM | POA: Diagnosis not present

## 2020-03-16 DIAGNOSIS — M1030 Gout due to renal impairment, unspecified site: Secondary | ICD-10-CM | POA: Diagnosis not present

## 2020-03-16 DIAGNOSIS — E1142 Type 2 diabetes mellitus with diabetic polyneuropathy: Secondary | ICD-10-CM | POA: Diagnosis not present

## 2020-03-16 DIAGNOSIS — Z48815 Encounter for surgical aftercare following surgery on the digestive system: Secondary | ICD-10-CM | POA: Diagnosis not present

## 2020-03-16 DIAGNOSIS — F411 Generalized anxiety disorder: Secondary | ICD-10-CM | POA: Diagnosis not present

## 2020-03-16 NOTE — Telephone Encounter (Signed)
Roselie Awkward Physical Therapist- [place]  Roselie Awkward called to say he had been out per orders from the hospital to assess patient. The patient will be receiving PT and OT, he also said that the patient was no longer taking Victoza, and that the patient said you were aware. I asked Roselie Awkward to fax the orders to [place].

## 2020-03-19 ENCOUNTER — Encounter: Payer: Self-pay | Admitting: Gastroenterology

## 2020-03-19 NOTE — Telephone Encounter (Signed)
Called and spoke with patient, he is going to check with [facility] and Dr Clyda Greener office about the orders for [facility] and call me back tomorrow. He is not able to travel much by car because he can not put seat beat on because of surgery. We offered virtual visit for hospital follow up.

## 2020-03-20 NOTE — Telephone Encounter (Signed)
Sharyn Lull OT - [facility] Plan called to say she had gone out to evaluate patient and he does not need OT at this time.

## 2020-03-22 ENCOUNTER — Telehealth: Payer: Self-pay | Admitting: Internal Medicine

## 2020-03-22 NOTE — Telephone Encounter (Signed)
Scheduled

## 2020-03-22 NOTE — Telephone Encounter (Signed)
After patient talking with [facility] the last several days, it looks like the PCP is the one that is expected to sign off on [facility] orders. They have spoken with surgeon's office. I let patient know we need to set up some kind of office visit for you to discuss his needs. From what I can tell he does not want very much Home Health.

## 2020-03-22 NOTE — Telephone Encounter (Signed)
Error

## 2020-03-22 NOTE — Telephone Encounter (Signed)
LWM to [facility] to schedule AMW on 04/11/2020

## 2020-03-26 ENCOUNTER — Encounter: Payer: Self-pay | Admitting: Internal Medicine

## 2020-03-26 ENCOUNTER — Ambulatory Visit (INDEPENDENT_AMBULATORY_CARE_PROVIDER_SITE_OTHER): Payer: Medicare Other | Admitting: Internal Medicine

## 2020-03-26 ENCOUNTER — Other Ambulatory Visit: Payer: Self-pay

## 2020-03-26 ENCOUNTER — Other Ambulatory Visit (HOSPITAL_BASED_OUTPATIENT_CLINIC_OR_DEPARTMENT_OTHER): Payer: Self-pay | Admitting: Surgery

## 2020-03-26 VITALS — BP 132/78

## 2020-03-26 DIAGNOSIS — C679 Malignant neoplasm of bladder, unspecified: Secondary | ICD-10-CM

## 2020-03-26 DIAGNOSIS — F32A Depression, unspecified: Secondary | ICD-10-CM

## 2020-03-26 DIAGNOSIS — N321 Vesicointestinal fistula: Secondary | ICD-10-CM | POA: Diagnosis not present

## 2020-03-26 DIAGNOSIS — Z96652 Presence of left artificial knee joint: Secondary | ICD-10-CM

## 2020-03-26 DIAGNOSIS — I252 Old myocardial infarction: Secondary | ICD-10-CM

## 2020-03-26 DIAGNOSIS — Z85828 Personal history of other malignant neoplasm of skin: Secondary | ICD-10-CM | POA: Diagnosis not present

## 2020-03-26 DIAGNOSIS — E119 Type 2 diabetes mellitus without complications: Secondary | ICD-10-CM

## 2020-03-26 DIAGNOSIS — Z96651 Presence of right artificial knee joint: Secondary | ICD-10-CM

## 2020-03-26 DIAGNOSIS — I10 Essential (primary) hypertension: Secondary | ICD-10-CM | POA: Diagnosis not present

## 2020-03-26 DIAGNOSIS — F419 Anxiety disorder, unspecified: Secondary | ICD-10-CM | POA: Diagnosis not present

## 2020-03-26 DIAGNOSIS — Z794 Long term (current) use of insulin: Secondary | ICD-10-CM

## 2020-03-26 DIAGNOSIS — E7849 Other hyperlipidemia: Secondary | ICD-10-CM

## 2020-03-26 DIAGNOSIS — Z86010 Personal history of colonic polyps: Secondary | ICD-10-CM

## 2020-03-26 MED FILL — traMADol HCL 50 MG TABS: 50 | 7 days supply | Qty: 30 | Fill #0

## 2020-03-26 NOTE — Progress Notes (Addendum)
Subjective:    Patient ID: Isaac [name], male    DOB: December 28, 1955, 64 y.o.   MRN: [id number]  HPI 64 year old Male interviewed by telephone for hospitalization follow up.  He has insulin-dependent diabetes and is followed by endocrinologist.  He is status post robotic rectosigmoid resection March 09, 2020 by Dr. Johney Maine.  This is a hospital follow-up of that admission.  Patient is not up to driving at the present time.  He had surgery in late October and has not driven since then.  He has been receiving home health services and physical therapy.  He resides alone.    He is agreeable to visit in this format today.  He is at home and I am in my office.    Patient underwent surgery for repair of colovesical fistula status post robotic recto sigmoid resection which was done March 09, 2020.  Surgery was done by Dr. Johney Maine, general surgeon.  Patient was found to have redundant sigmoid colon with very dense adhesions to the right dome of the bladder consistent with CT findings and cystoscopy findings.  Patient has history of prior right-sided nephrectomy and ureteral surgery for transitional cell cancer.  He was found to have diverticulitis with colovesical fistula on CT scan and surgery was recommended.  Patient is ambulatory at home.  He has a history of insulin-dependent diabetes mellitus and is followed by endocrinologist in [place].  Says Accu-Cheks are stable.  He is on Toujeo 40 units twice daily.  He had diabetic eye exam by Dr. Herbert Deaner in October 2020.  [facility] also provides care for him with chronic illnesses.  He has had 2 COVID-19 vaccines and will likely get his third soon at a local pharmacy.  He says he had flu vaccine at [facility] pharmacy at Dr. Antonieta Pert office.  Patient says blood pressure is stable at 132/78.  He currently is on MiraLAX daily and has bowel movement about every other day.  For a while he was obstipated but that has improved.  Has appointment to  see endocrinologist in about 3 months.  Says Accu-Chek was 131 fasting recently.  He does not feel he needs a walker.  Physical therapy came out a couple of times but he really does not need them.  He is on tramadol and oxycodone for pain.  He does not feel OT is needed at this time.      Review of Systems see above     Objective:   Physical Exam  Patient reports blood pressure 132/78      Assessment & Plan:  Status post repair of colovesical fistula  Type 2 diabetes mellitus treated with Toujeo and Vardaman maintenance-is to have third Covid vaccine in the near future.  Flu vaccine done at cancer center pharmacy.  Constipation status post colovesical surgery.  Is on MiraLAX  History of transitional cell carcinoma of the bladder followed by Dr. [name] and Urology.  Initially diagnosed with bladder cancer in 2015.  Hyperlipidemia treated with Crestor  GE reflux treated with Protonix  History of adenomatous colon polyps.  Had colonoscopy by Dr. [name] October 2021 several polyps removed including adenomatous and hyperplastic polyps.  History of anxiety and depression-stable  History of skin cancers  History of essential hypertension  Remote history of NSTEMI  Status post bilateral knee arthroplasties  Plan: Patient seems to be recovering well from his surgery.  His annual Medicare wellness visit has been booked via telephone at his request due  to his recuperation from surgery on December 1 and will be done virtually at that time.  Time spent today with patient for posthospitalization visit is 25 minutes including time spent reviewing records and time interviewing patient on phone as he was not able to connect virtually today due to technical difficulties.  He was agreeable to this visit in this format today.

## 2020-03-26 NOTE — Patient Instructions (Signed)
It was a pleasure to speak with you by phone today.  We are glad you are recuperating well from your surgery.  Medicare wellness visit has been scheduled by telephone due to your recuperation from recent surgery on December 1.

## 2020-03-27 ENCOUNTER — Telehealth: Payer: Self-pay | Admitting: Internal Medicine

## 2020-03-27 DIAGNOSIS — G2581 Restless legs syndrome: Secondary | ICD-10-CM | POA: Diagnosis not present

## 2020-03-27 DIAGNOSIS — Z96653 Presence of artificial knee joint, bilateral: Secondary | ICD-10-CM | POA: Diagnosis not present

## 2020-03-27 DIAGNOSIS — Z48815 Encounter for surgical aftercare following surgery on the digestive system: Secondary | ICD-10-CM | POA: Diagnosis not present

## 2020-03-27 DIAGNOSIS — Z9181 History of falling: Secondary | ICD-10-CM | POA: Diagnosis not present

## 2020-03-27 NOTE — Telephone Encounter (Signed)
[place] Certified Orders to [facility] [phone] for 03/16/2020 to 05/14/2020  Order # [id number] Phone # [phone]

## 2020-04-11 ENCOUNTER — Ambulatory Visit (INDEPENDENT_AMBULATORY_CARE_PROVIDER_SITE_OTHER): Payer: Medicare Other | Admitting: Internal Medicine

## 2020-04-11 ENCOUNTER — Other Ambulatory Visit: Payer: Self-pay | Admitting: Internal Medicine

## 2020-04-11 ENCOUNTER — Encounter: Payer: Self-pay | Admitting: Internal Medicine

## 2020-04-11 DIAGNOSIS — Z0000 Encounter for general adult medical examination without abnormal findings: Secondary | ICD-10-CM

## 2020-04-11 DIAGNOSIS — E1142 Type 2 diabetes mellitus with diabetic polyneuropathy: Secondary | ICD-10-CM | POA: Diagnosis not present

## 2020-04-11 DIAGNOSIS — E1165 Type 2 diabetes mellitus with hyperglycemia: Secondary | ICD-10-CM | POA: Diagnosis not present

## 2020-04-11 MED ORDER — AZITHROMYCIN 250 MG PO TABS: ORAL_TABLET | ORAL | 0 refills | Status: DC

## 2020-04-11 MED FILL — AZITHROMYCIN 250 MG TABLET: 250 | 5 days supply | Qty: 6 | Fill #0

## 2020-04-11 NOTE — Progress Notes (Signed)
Subjective:    Patient ID: Isaac [name], male    DOB: 09/19/1955, 64 y.o.   MRN: [id number]  HPI 64 year old Male who receives disability benefits seen today for Medicare wellness visit.  Due to recent surgery he is unable to come to the office.  He prefers to have virtual visit.  However, he had difficulty connecting virtually so we continued by telephone alone.  Using 2 identifiers, he is identified as Isaac [name]. [name], a longstanding patient in this practice.  He is agreeable to visit in this format today.  He is at home and I am at my office.  He has a history of insulin-dependent type 2 diabetes mellitus followed by Dr. Steffanie Dunn, endocrinologist. He is status post robotic rectosigmoid resection March 09, 2020 by Dr. Johney Maine.  States he is not driving much still at this point in time.  He had hospitalization virtual follow-up visit with me November 15.  He has been receiving home health services and physical therapy but says that those services will be discontinued very soon.  He has a history of prior right-sided nephrectomy and ureteral surgery for transitional cell cancer.  Followed by Dr. [name] and Urology.  Patient is ambulatory at home.  He has been able to drive short distances.  He is on Toujeo 40 units twice daily and says his Accu-Cheks are stable.  He had a diabetic eye exam by Dr. Herbert Deaner in October 2020.  [facility] also provides chronic care management services for him.  Reminded to get third Covid vaccine.   Review of Systems no new complaints     Objective:   Physical Exam Patient reports that his vital signs are normal.  Says he is afebrile.  He says he has regular pulse and stable blood pressure.       Assessment & Plan:  Doing well status post surgery for colovesical fistula by Dr. 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  History of diabetes mellitus treated by Dr.  Steffanie Dunn with Nelva Nay.  History of coronary artery disease status post MI 2005 non-STEMI  History of adenomatous colon polyps  History of diabetic neuropathy.  History of multiple skin cancers.  In January 2017 he had left knee total arthroplasty and in November 2017 he had right knee total arthroplasty  History of sleep apnea  History of restless leg syndrome  History of fatty liver with mild elevation of SGOT and SGPT.  Fatty liver diagnosed by Dr. [name] in 2016.  History of right rotator cuff repair  History of umbilical hernia repair in 2004   History of anxiety and depression  History of hearing loss  History of peripheral neuropathy  Hyperlipidemia treated with statin  Hypertension stable  Plan: Return in 1 year or as needed  Subjective:   Patient presents for Medicare Annual/Subsequent preventive examination.  Review Past Medical/Family/Social: See above Father died of an accident at age 7 with history of CABG.  Mother died at age 22 apparently of accidental carbon monoxide poisoning.  Maternal grandmother with history of diabetes.  Maternal grandfather with history of lung cancer.  Risk Factors  Current exercise habits: Not able to exercise at the present time Dietary issues discussed: Low-fat low carbohydrate  Cardiac risk factors: History of MI, hyperlipidemia  Depression Screen  (Note: if answer to either of the following is "Yes", a more complete depression screening is indicated)   Over the past two weeks, have you felt down,  depressed or hopeless? No  Over the past two weeks, have you felt little interest or pleasure in doing things? No Have you lost interest or pleasure in daily life? No Do you often feel hopeless? No Do you cry easily over simple problems? No   Activities of Daily Living  In your present state of health, do you have any difficulty performing the following activities?:   Driving? No  Managing money? No  Feeding yourself? No   Getting from bed to chair? No  Climbing a flight of stairs? No  Preparing food and eating?: No  Bathing or showering? No  Getting dressed: No  Getting to the toilet? No  Using the toilet:No  Moving around from place to place: No  In the past year have you fallen or had a near fall?:No  Are you sexually active? No  Do you have more than one partner? No   Hearing Difficulties: Yes Do you often ask people to speak up or repeat themselves?  Yes Do you experience ringing or noises in your ears?  Yes Do you have difficulty understanding soft or whispered voices?  Yes history of hearing loss Do you feel that you have a problem with memory?  Yes Do you often misplace items?  Yes   Home Safety:  Do you have a smoke alarm at your residence? Yes Do you have grab bars in the bathroom?  None Do you have throw rugs in your house?  None   Cognitive Testing  Alert? Yes Normal Appearance?Yes  Oriented to person? Yes Place? Yes  Time? Yes  Recall of three objects? Yes  Can perform simple calculations? Yes  Displays appropriate judgment?Yes  Can read the correct time from a watch face?Yes   List the Names of Other Physician/Practitioners you currently use: Oncologist, urologist, endocrinologist, cardiologist, dermatologist   Objective:        Assessment:    Annual wellness medicare exam   Plan:    During the course of the visit the patient was educated and counseled about appropriate screening and preventive services including:   See above is to get third Covid vaccine     Patient Instructions (the written plan) was given to the patient.  Medicare Attestation  I have personally reviewed:  The patient's medical and social history  Their use of alcohol, tobacco or illicit drugs  Their current medications and supplements  The patient's functional ability including ADLs,fall risks, home safety risks, cognitive, and hearing and visual impairment  Diet and physical activities   Evidence for depression or mood disorders  The patient's weight, height, BMI, and visual acuity have been recorded in the chart. I have made referrals, counseling, and provided education to the patient based on review of the above and I have provided the patient with a written personalized care plan for preventive services.

## 2020-04-13 DIAGNOSIS — G4733 Obstructive sleep apnea (adult) (pediatric): Secondary | ICD-10-CM | POA: Diagnosis not present

## 2020-04-18 DIAGNOSIS — Z794 Long term (current) use of insulin: Secondary | ICD-10-CM | POA: Diagnosis not present

## 2020-04-18 DIAGNOSIS — E119 Type 2 diabetes mellitus without complications: Secondary | ICD-10-CM | POA: Diagnosis not present

## 2020-04-23 ENCOUNTER — Ambulatory Visit: Payer: Self-pay | Admitting: Surgery

## 2020-04-27 ENCOUNTER — Ambulatory Visit: Payer: Medicare Other | Attending: Internal Medicine

## 2020-04-27 ENCOUNTER — Other Ambulatory Visit (HOSPITAL_BASED_OUTPATIENT_CLINIC_OR_DEPARTMENT_OTHER): Payer: Self-pay | Admitting: Internal Medicine

## 2020-04-27 DIAGNOSIS — Z23 Encounter for immunization: Secondary | ICD-10-CM

## 2020-04-27 MED FILL — TAMSULOSIN HCL 0.4 MG CAP: 0.4 | 30 days supply | Qty: 60 | Fill #8

## 2020-04-27 NOTE — Progress Notes (Signed)
   Covid-19 Vaccination Clinic  Name:  JOANDRY SLAGTER    MRN: [id number] DOB: [birth year]  04/27/2020  [name] was observed post Covid-19 immunization for 15 minutes without incident. He was provided with Vaccine Information Sheet and instruction to access the V-Safe system.   [name] was instructed to call 911 with any severe reactions post vaccine: Marland Kitchen Difficulty breathing  . Swelling of face and throat  . A fast heartbeat  . A bad rash all over body  . Dizziness and weakness   Immunizations Administered    Name Date Dose VIS Date Route   Pfizer COVID-19 Vaccine 04/27/2020 10:50 AM 0.3 mL 02/29/2020 Intramuscular   Manufacturer: [place]   Lot: [id number]   [place]: [id number]

## 2020-04-29 NOTE — Progress Notes (Signed)
I believe the revised note has what you need.

## 2020-04-29 NOTE — Progress Notes (Signed)
This was a phone visit as he could not connect  virtually or come to office s/p recent surgery. See addendum.

## 2020-04-30 MED FILL — PFIZER-BIONTECH COVID-19 VA: 30 | 1 days supply | Qty: 0 | Fill #0

## 2020-05-02 NOTE — Patient Instructions (Signed)
It was a pleasure to speak with you today.  Take good care of yourself and continue to keep up with your appointments with multiple specialist.

## 2020-05-07 ENCOUNTER — Encounter: Payer: Self-pay | Admitting: Family

## 2020-05-07 ENCOUNTER — Inpatient Hospital Stay (HOSPITAL_BASED_OUTPATIENT_CLINIC_OR_DEPARTMENT_OTHER): Payer: Medicare Other | Admitting: Family

## 2020-05-07 ENCOUNTER — Inpatient Hospital Stay: Payer: Medicare Other | Attending: Hematology & Oncology

## 2020-05-07 ENCOUNTER — Inpatient Hospital Stay: Payer: Medicare Other

## 2020-05-07 ENCOUNTER — Other Ambulatory Visit (HOSPITAL_BASED_OUTPATIENT_CLINIC_OR_DEPARTMENT_OTHER): Payer: Self-pay | Admitting: Internal Medicine

## 2020-05-07 ENCOUNTER — Other Ambulatory Visit: Payer: Self-pay

## 2020-05-07 VITALS — BP 131/74 | HR 80 | Temp 98.3°F | Resp 18 | Ht 69.0 in | Wt 214.1 lb

## 2020-05-07 VITALS — BP 131/74 | HR 80 | Temp 98.5°F | Resp 18 | Wt 214.0 lb

## 2020-05-07 DIAGNOSIS — D508 Other iron deficiency anemias: Secondary | ICD-10-CM

## 2020-05-07 DIAGNOSIS — C670 Malignant neoplasm of trigone of bladder: Secondary | ICD-10-CM

## 2020-05-07 DIAGNOSIS — Z9221 Personal history of antineoplastic chemotherapy: Secondary | ICD-10-CM | POA: Diagnosis not present

## 2020-05-07 DIAGNOSIS — Z8551 Personal history of malignant neoplasm of bladder: Secondary | ICD-10-CM | POA: Diagnosis not present

## 2020-05-07 DIAGNOSIS — G629 Polyneuropathy, unspecified: Secondary | ICD-10-CM | POA: Insufficient documentation

## 2020-05-07 DIAGNOSIS — Z905 Acquired absence of kidney: Secondary | ICD-10-CM | POA: Insufficient documentation

## 2020-05-07 DIAGNOSIS — Z95828 Presence of other vascular implants and grafts: Secondary | ICD-10-CM

## 2020-05-07 DIAGNOSIS — Z79899 Other long term (current) drug therapy: Secondary | ICD-10-CM | POA: Diagnosis not present

## 2020-05-07 DIAGNOSIS — Z452 Encounter for adjustment and management of vascular access device: Secondary | ICD-10-CM | POA: Diagnosis not present

## 2020-05-07 DIAGNOSIS — C651 Malignant neoplasm of right renal pelvis: Secondary | ICD-10-CM

## 2020-05-07 DIAGNOSIS — Z7982 Long term (current) use of aspirin: Secondary | ICD-10-CM | POA: Insufficient documentation

## 2020-05-07 LAB — CBC WITH DIFFERENTIAL (CANCER CENTER ONLY)
Abs Immature Granulocytes: 0.01 10*3/uL (ref 0.00–0.07)
Basophils Absolute: 0 10*3/uL (ref 0.0–0.1)
Basophils Relative: 0 %
Eosinophils Absolute: 0.1 10*3/uL (ref 0.0–0.5)
Eosinophils Relative: 2 %
HCT: 37.8 % — ABNORMAL LOW (ref 39.0–52.0)
Hemoglobin: 12.4 g/dL — ABNORMAL LOW (ref 13.0–17.0)
Immature Granulocytes: 0 %
Lymphocytes Relative: 29 %
Lymphs Abs: 1.2 10*3/uL (ref 0.7–4.0)
MCH: 30 pg (ref 26.0–34.0)
MCHC: 32.8 g/dL (ref 30.0–36.0)
MCV: 91.3 fL (ref 80.0–100.0)
Monocytes Absolute: 0.4 10*3/uL (ref 0.1–1.0)
Monocytes Relative: 10 %
Neutro Abs: 2.4 10*3/uL (ref 1.7–7.7)
Neutrophils Relative %: 59 %
Platelet Count: 113 10*3/uL — ABNORMAL LOW (ref 150–400)
RBC: 4.14 MIL/uL — ABNORMAL LOW (ref 4.22–5.81)
RDW: 13 % (ref 11.5–15.5)
WBC Count: 4.1 10*3/uL (ref 4.0–10.5)
nRBC: 0 % (ref 0.0–0.2)

## 2020-05-07 LAB — CMP (CANCER CENTER ONLY)
ALT: 24 U/L (ref 0–44)
AST: 20 U/L (ref 15–41)
Albumin: 4 g/dL (ref 3.5–5.0)
Alkaline Phosphatase: 53 U/L (ref 38–126)
Anion gap: 7 (ref 5–15)
BUN: 25 mg/dL — ABNORMAL HIGH (ref 8–23)
CO2: 26 mmol/L (ref 22–32)
Calcium: 8.9 mg/dL (ref 8.9–10.3)
Chloride: 102 mmol/L (ref 98–111)
Creatinine: 2.12 mg/dL — ABNORMAL HIGH (ref 0.61–1.24)
GFR, Estimated: 34 mL/min — ABNORMAL LOW (ref >60)
Glucose, Bld: 160 mg/dL — ABNORMAL HIGH (ref 70–99)
Potassium: 4.1 mmol/L (ref 3.5–5.1)
Sodium: 135 mmol/L (ref 135–145)
Total Bilirubin: 0.6 mg/dL (ref 0.3–1.2)
Total Protein: 6.4 g/dL — ABNORMAL LOW (ref 6.5–8.1)

## 2020-05-07 LAB — PLATELET BY CITRATE

## 2020-05-07 LAB — SAVE SMEAR(SSMR), FOR PROVIDER SLIDE REVIEW

## 2020-05-07 LAB — LACTATE DEHYDROGENASE: LDH: 133 U/L (ref 98–192)

## 2020-05-07 MED ORDER — HEPARIN SOD (PORK) LOCK FLUSH 100 UNIT/ML IV SOLN
500.0000 [IU] | Freq: Once | INTRAVENOUS | Status: AC
  Administered 2020-05-07: 500 [IU] via INTRAVENOUS
  Filled 2020-05-07: qty 5

## 2020-05-07 MED ORDER — SODIUM CHLORIDE 0.9% FLUSH
10.0000 mL | Freq: Once | INTRAVENOUS | Status: AC
  Administered 2020-05-07: 10 mL via INTRAVENOUS
  Filled 2020-05-07: qty 10

## 2020-05-07 MED FILL — SHINGRIX 50 MCG SUS: 50 | 1 days supply | Qty: 1 | Fill #0

## 2020-05-07 NOTE — Progress Notes (Signed)
Hematology and Oncology Follow Up Visit  Isaac [name] [id number] Jul 27, 1955 64 y.o. 05/07/2020   Principle Diagnosis:  Stage III (T3NxM0)Infiltrating high grade papillary urothelial carcinomaof the RIGHT renal hilum -right nephroureterectomy on 02/12/2017  Past Therapy: Cisplatin/Gemcitabine s/pcycle6 - completed on 07/27/2017  Current Therapy: Observation   Interim History:  [name] is here today for follow-up. He had sigmoid colon resection with repair of colovesical fistula on 03/09/2020 He has recuperated well. He still has a little tenderness in the lower portion of the right abdomen but this continues to improve and is described as mild/tolerable.  He states that he follows up with his urologist on 05/28/2020 for repeat cystoscopy.  No episodes of blood loss to report. No abnormal bruising, no petechiae.  No fever, chills, n/v, cough, cough, rash, SOB, palpitations or changes in bowel or bladder habits.  He states that he has had 2 episodes of left chest discomfort last only a few seconds and resolving without intervention. He has an appointment set up with his cardiologist Dr. Harrington [name] on 05/28/2020. He states that he has not needed to take his nitro. He verbalized that he will be mindful and contact EMS if needed.  The neuropathy in his feet has seemed worse. He is concerned with his hearing loss that he will fall in the shower. He has had some dizziness. He has spoken with his insurance company and they will help with the installation of a walk in shower if we write a letter of necessity.  He has intermittent puffiness in his feet and ankles. This seems to be improved.  No falls or syncope.  He has maintained a good appetite but admits that he needs to better hydrate throughout the day. His weight is stable.   ECOG Performance Status: 1 - Symptomatic but completely ambulatory  Medications:  Allergies as of 05/07/2020      Reactions   Hydrocodone Itching,  Nausea And Vomiting   Nsaids Other (See Comments)   Has 1 kidney left   Dilaudid [hydromorphone] Nausea And Vomiting      Medication List       Accurate as of May 07, 2020 10:00 AM. If you have any questions, ask your nurse or doctor.        acetaminophen 500 MG tablet Commonly known as: TYLENOL Take 500 mg by mouth every 6 (six) hours as needed for moderate pain or headache.   ALPRAZolam 1 MG tablet Commonly known as: XANAX Take 1 tablet (1 mg total) by mouth 3 (three) times daily as needed for anxiety.   aspirin EC 81 MG tablet Take 81 mg by mouth at bedtime.   azithromycin 250 MG tablet Commonly known as: ZITHROMAX 2 po day 1 with a meal then one po days 2-5   ciclopirox 8 % solution Commonly known as: PENLAC Apply 1 application topically at bedtime as needed (toenail fungus). APPLY ON NAIL AND SURROUNDING SKIN OVER PREVIOUS COAT. AFTER 7 DAYS REMOVE WITH ALCOHOL AND REPEAT CYCLE.   diazepam 10 MG tablet Commonly known as: VALIUM 10 mg. Insert 10 mg rectally at night as needed bowel relaxation   finasteride 5 MG tablet Commonly known as: PROSCAR Take 5 mg by mouth daily.   glimepiride 2 MG tablet Commonly known as: AMARYL TAKE 1 BY MOUTH DAILY BEFORE SUPPER What changed:   how much to take  how to take this  when to take this  additional instructions   insulin glargine (1 Unit Dial) 300 UNIT/ML Solostar Pen  Commonly known as: TOUJEO Inject 40 Units into the skin 2 (two) times daily.   isosorbide mononitrate 120 MG 24 hr tablet Commonly known as: IMDUR TAKE 1 TABLET BY MOUTH DAILY What changed:   how much to take  how to take this  when to take this  additional instructions   ketoconazole 2 % cream Commonly known as: NIZORAL Apply 1 application topically daily.   metoprolol succinate 25 MG 24 hr tablet Commonly known as: TOPROL-XL TAKE 1 TABLET BY MOUTH AT BEDTIME. GENERIC EQUIVALENT FOR TOPROL-XL What changed:   how much to  take  how to take this  when to take this  additional instructions   nitroGLYCERIN 0.4 MG SL tablet Commonly known as: NITROSTAT Place 1 tablet (0.4 mg total) under the tongue every 5 (five) minutes as needed for chest pain. Repeat every 5 minute up to 3 doses if no relief call 911.   ondansetron 4 MG tablet Commonly known as: ZOFRAN Take 4 mg by mouth every 8 (eight) hours as needed for nausea/vomiting.   Ozempic (0.25 or 0.5 MG/DOSE) 2 MG/1.5ML Sopn Generic drug: Semaglutide(0.25 or 0.5MG /DOS) Inject into the skin.   pantoprazole 40 MG tablet Commonly known as: PROTONIX Take 1 tablet (40 mg total) by mouth every morning.   PRESCRIPTION MEDICATION Inhale into the lungs at bedtime. CPAP   rOPINIRole 2 MG tablet Commonly known as: REQUIP TAKE 1 TABLET BY MOUTH AT BEDTIME   rosuvastatin 10 MG tablet Commonly known as: CRESTOR TAKE 1 TAB (10 MG) BY MOUTH EVERY OTHER DAY What changed:   how much to take  how to take this  when to take this  additional instructions   tamsulosin 0.4 MG Caps capsule Commonly known as: FLOMAX Take 0.4 mg by mouth daily.   traMADol 50 MG tablet Commonly known as: ULTRAM Take 1-2 tablets (50-100 mg total) by mouth every 6 (six) hours as needed for moderate pain or severe pain.       Allergies:  Allergies  Allergen Reactions  . Hydrocodone Itching and Nausea And Vomiting  . Nsaids Other (See Comments)    Has 1 kidney left  . Dilaudid [Hydromorphone] Nausea And Vomiting    Past Medical History, Surgical history, Social history, and Family History were reviewed and updated.  Review of Systems: All other 10 point review of systems is negative.   Physical Exam:  height is 5\' 9"  (1.753 m) and weight is 214 lb 1.9 oz (97.1 kg). His oral temperature is 98.3 F (36.8 C). His blood pressure is 131/74 and his pulse is 80. His respiration is 18 and oxygen saturation is 98%.   Wt Readings from Last 3 Encounters:  05/07/20 214 lb 1.9  oz (97.1 kg)  05/07/20 214 lb (97.1 kg)  03/12/20 215 lb 9.8 oz (97.8 kg)    Ocular: Sclerae unicteric, pupils equal, round and reactive to light Ear-nose-throat: Oropharynx clear, dentition fair Lymphatic: No cervical or supraclavicular adenopathy Lungs no rales or rhonchi, good excursion bilaterally Heart regular rate and rhythm, no murmur appreciated Abd soft, nontender, positive bowel sounds MSK no focal spinal tenderness, no joint edema Neuro: non-focal, well-oriented, appropriate affect Breasts: Deferred   Lab Results  Component Value Date   WBC 4.1 05/07/2020   HGB 12.4 (L) 05/07/2020   HCT 37.8 (L) 05/07/2020   MCV 91.3 05/07/2020   PLT 113 (L) 05/07/2020   Lab Results  Component Value Date   FERRITIN 52 07/06/2018   IRON 98 07/06/2018  TIBC 371 07/06/2018   UIBC 273 07/06/2018   IRONPCTSAT 27 07/06/2018   Lab Results  Component Value Date   RETICCTPCT 2.2 (H) 10/15/2017   RBC 4.14 (L) 05/07/2020   No results found for: Nils Pyle, [facility] Lab Results  Component Value Date   IGA 232 01/25/2015   No results found for: Odetta Pink, SPEI   Chemistry      Component Value Date/Time   NA 136 03/10/2020 0355   NA 134 05/04/2017 0925   K 4.3 03/10/2020 0355   K 4.5 05/04/2017 0925   CL 106 03/10/2020 0355   CL 97 (L) 05/04/2017 0925   CO2 21 (L) 03/10/2020 0355   CO2 23 05/04/2017 0925   BUN 18 03/10/2020 0355   BUN 28 (H) 05/04/2017 0925   CREATININE 1.60 (H) 03/10/2020 0355   CREATININE 1.79 (H) 03/05/2020 0900   CREATININE 1.95 (H) 03/07/2019 1038   GLU 86 03/31/2016 0000      Component Value Date/Time   CALCIUM 7.9 (L) 03/10/2020 0355   CALCIUM 9.3 05/04/2017 0925   ALKPHOS 41 03/05/2020 0900   ALKPHOS 58 05/04/2017 0925   AST 15 03/05/2020 0900   ALT 21 03/05/2020 0900   ALT 90 (H) 05/04/2017 0925   BILITOT 0.5 03/05/2020 0900       Impression and Plan:  [name]  is a very pleasant 64yo caucasian gentleman with infiltrating high grade papillary urothelial carcinoma with right nephrectomy in October 2018 and adjuvant chemotherapy.  Letter for walk in shower was given to patient.  We will plan to follow-up in another 3 months. Port flush every 6 weeks.  He will contact our office with any questions or concerns. We can certainly see him sooner if needed.   Laverna Peace, NP 12/27/202110:00 AM

## 2020-05-07 NOTE — Patient Instructions (Signed)

## 2020-05-22 ENCOUNTER — Telehealth: Payer: Self-pay | Admitting: Internal Medicine

## 2020-05-22 DIAGNOSIS — M21622 Bunionette of left foot: Secondary | ICD-10-CM | POA: Diagnosis not present

## 2020-05-22 DIAGNOSIS — M21621 Bunionette of right foot: Secondary | ICD-10-CM | POA: Diagnosis not present

## 2020-05-22 DIAGNOSIS — B351 Tinea unguium: Secondary | ICD-10-CM | POA: Diagnosis not present

## 2020-05-22 DIAGNOSIS — E1142 Type 2 diabetes mellitus with diabetic polyneuropathy: Secondary | ICD-10-CM | POA: Diagnosis not present

## 2020-05-22 NOTE — Telephone Encounter (Signed)
Received a fax today from [place] [facility] [place], Dr Elwin Mocha, that he felt patient would benefit from a handicap accessible shower because of his neuropathy.  I called patient to discuss, he is going to discuss getting a walkin shower with all of his doctors, I let him know he needs to discuss with insurance to see if they would cover and what they were going to require, because things like this would probably needs doctors notes from office visit going into detail as to how he would benefit. This is not usually an easy process, takes lots of paperwork and time and that Dr Renold Genta has not seen him in office in over a year and she could not fill out any paperwork at this time stating that he would benefit having a walk in shower.

## 2020-05-25 ENCOUNTER — Telehealth: Payer: Self-pay | Admitting: Internal Medicine

## 2020-05-25 NOTE — Telephone Encounter (Signed)
Received forms from [facility],  [employer], for [name] to get Diabetic Shoes.  After review the forms it looks like the forms need to be filled out be who follows the patient for his diabetes which would be his endocrinologist. So we sent a note back to them letting them know that. We have not seen patient in office since 03/2018

## 2020-05-28 ENCOUNTER — Ambulatory Visit: Payer: Medicare Other | Admitting: Internal Medicine

## 2020-05-31 DIAGNOSIS — H2513 Age-related nuclear cataract, bilateral: Secondary | ICD-10-CM | POA: Diagnosis not present

## 2020-05-31 DIAGNOSIS — H25013 Cortical age-related cataract, bilateral: Secondary | ICD-10-CM | POA: Diagnosis not present

## 2020-05-31 DIAGNOSIS — E119 Type 2 diabetes mellitus without complications: Secondary | ICD-10-CM | POA: Diagnosis not present

## 2020-05-31 DIAGNOSIS — H35363 Drusen (degenerative) of macula, bilateral: Secondary | ICD-10-CM | POA: Diagnosis not present

## 2020-05-31 LAB — HM DIABETES EYE EXAM

## 2020-06-01 ENCOUNTER — Encounter: Payer: Self-pay | Admitting: Internal Medicine

## 2020-06-04 ENCOUNTER — Other Ambulatory Visit (HOSPITAL_BASED_OUTPATIENT_CLINIC_OR_DEPARTMENT_OTHER): Payer: Self-pay | Admitting: Podiatrist

## 2020-06-07 ENCOUNTER — Other Ambulatory Visit (HOSPITAL_BASED_OUTPATIENT_CLINIC_OR_DEPARTMENT_OTHER): Payer: Self-pay | Admitting: Urology

## 2020-06-07 MED FILL — FINASTERIDE 5 MG TABLET: 5 | 90 days supply | Qty: 90 | Fill #3

## 2020-06-08 ENCOUNTER — Other Ambulatory Visit (HOSPITAL_BASED_OUTPATIENT_CLINIC_OR_DEPARTMENT_OTHER): Payer: Self-pay | Admitting: Podiatrist

## 2020-06-08 MED FILL — TAMSULOSIN HCL 0.4 MG CAP: 0.4 | 30 days supply | Qty: 60 | Fill #0

## 2020-06-11 MED FILL — LIDOCAINE-PRILOCAINE CREAM: 2.5-2.5 | 30 days supply | Qty: 30 | Fill #0

## 2020-06-13 DIAGNOSIS — M21621 Bunionette of right foot: Secondary | ICD-10-CM | POA: Diagnosis not present

## 2020-06-13 DIAGNOSIS — E1142 Type 2 diabetes mellitus with diabetic polyneuropathy: Secondary | ICD-10-CM | POA: Diagnosis not present

## 2020-06-13 DIAGNOSIS — M21622 Bunionette of left foot: Secondary | ICD-10-CM | POA: Diagnosis not present

## 2020-06-18 ENCOUNTER — Inpatient Hospital Stay: Payer: Medicare Other | Attending: Hematology & Oncology

## 2020-06-18 ENCOUNTER — Other Ambulatory Visit: Payer: Self-pay

## 2020-06-18 DIAGNOSIS — Z452 Encounter for adjustment and management of vascular access device: Secondary | ICD-10-CM | POA: Diagnosis not present

## 2020-06-18 DIAGNOSIS — Z8551 Personal history of malignant neoplasm of bladder: Secondary | ICD-10-CM | POA: Diagnosis not present

## 2020-06-18 DIAGNOSIS — Z95828 Presence of other vascular implants and grafts: Secondary | ICD-10-CM

## 2020-06-18 MED ORDER — SODIUM CHLORIDE 0.9% FLUSH
10.0000 mL | Freq: Once | INTRAVENOUS | Status: AC
  Administered 2020-06-18: 10 mL via INTRAVENOUS
  Filled 2020-06-18: qty 10

## 2020-06-18 MED ORDER — HEPARIN SOD (PORK) LOCK FLUSH 100 UNIT/ML IV SOLN
500.0000 [IU] | Freq: Once | INTRAVENOUS | Status: AC
  Administered 2020-06-18: 500 [IU] via INTRAVENOUS
  Filled 2020-06-18: qty 5

## 2020-06-18 MED FILL — SHINGRIX 50 MCG SUS: 50 | 1 days supply | Qty: 1 | Fill #1

## 2020-06-18 NOTE — Progress Notes (Signed)
Cardiology Office Note   Date:  06/19/2020   ID:  Isaac [name], DOB [birth year], MRN [id number]  PCP:  Isaac Showers, MD  Cardiologist:   Isaac Carnes, MD   F/U of CAD     History of Present Illness: Isaac [name] is a 65 y.o. male with a history of Isaac [name] is a 65 y.o. male with a hx of mild CAD and probable microvascular dz, TIMI II flow in LAD and LCx   He also has a hx of DM, HL, HTN, bladder CAD   Last cath in 2009  Minimal CAD   Myovue in 2014   Low risk  I saw the pt in [facility]\  The pt was hospitalized this winter with a colonicvesicular fistula   Underwent surgery.   He has recovered    He says his breathing is OK  He denies CP   Does say he's tired a lott  ? If due to chemotherapy  Pt says for breakfast he will have egg/toast.  For Lunch   Sandwich , possibly pimento cheese; SUpper CHicken or kielbasa  Drinks water; occasional wine     Current Meds  Medication Sig  . acetaminophen (TYLENOL) 500 MG tablet Take 500 mg by mouth every 6 (six) hours as needed for moderate pain or headache.  . ALPRAZolam (XANAX) 1 MG tablet Take 1 tablet (1 mg total) by mouth 3 (three) times daily as needed for anxiety.  Marland Kitchen aspirin EC 81 MG tablet Take 81 mg by mouth at bedtime.   . ciclopirox (PENLAC) 8 % solution Apply 1 application topically at bedtime as needed (toenail fungus). APPLY ON NAIL AND SURROUNDING SKIN OVER PREVIOUS COAT. AFTER 7 DAYS REMOVE WITH ALCOHOL AND REPEAT CYCLE.  . diazepam (VALIUM) 10 MG tablet 10 mg. Insert 10 mg rectally at night as needed bowel relaxation  . finasteride (PROSCAR) 5 MG tablet Take 5 mg by mouth daily.  Marland Kitchen glimepiride (AMARYL) 2 MG tablet TAKE 1 BY MOUTH DAILY BEFORE SUPPER (Patient taking differently: Take 2 mg by mouth 2 (two) times daily before a meal.)  . insulin glargine, 1 Unit Dial, (TOUJEO) 300 UNIT/ML Solostar Pen Inject 40 Units into the skin 2 (two) times daily.   . isosorbide mononitrate (IMDUR) 120 MG 24  hr tablet TAKE 1 TABLET BY MOUTH DAILY (Patient taking differently: Take 120 mg by mouth daily.)  . lidocaine-prilocaine (EMLA) cream Apply topically.  . metoprolol succinate (TOPROL-XL) 25 MG 24 hr tablet TAKE 1 TABLET BY MOUTH AT BEDTIME. GENERIC EQUIVALENT FOR TOPROL-XL (Patient taking differently: Take 25 mg by mouth at bedtime.)  . nitroGLYCERIN (NITROSTAT) 0.4 MG SL tablet Place 1 tablet (0.4 mg total) under the tongue every 5 (five) minutes as needed for chest pain. Repeat every 5 minute up to 3 doses if no relief call 911.  . ondansetron (ZOFRAN) 4 MG tablet Take 4 mg by mouth every 8 (eight) hours as needed for nausea/vomiting.  . pantoprazole (PROTONIX) 40 MG tablet Take 1 tablet (40 mg total) by mouth every morning.  Marland Kitchen PRESCRIPTION MEDICATION Inhale into the lungs at bedtime. CPAP  . rOPINIRole (REQUIP) 2 MG tablet TAKE 1 TABLET BY MOUTH AT BEDTIME (Patient taking differently: Take 2 mg by mouth at bedtime.)  . rosuvastatin (CRESTOR) 10 MG tablet TAKE 1 TAB (10 MG) BY MOUTH EVERY OTHER DAY (Patient taking differently: Take 10 mg by mouth every other day.)  . Semaglutide,0.25 or 0.5MG /DOS, (OZEMPIC, 0.25 OR 0.5  MG/DOSE,) 2 MG/1.5ML SOPN Inject into the skin.  . tamsulosin (FLOMAX) 0.4 MG CAPS capsule Take 0.4 mg by mouth daily.     Allergies:   Hydrocodone, Nsaids, and Dilaudid [hydromorphone]   Past Medical History:  Diagnosis Date  . Arthritis   . Bladder cancer [facility]) urologist-- dr Louis Meckel   11/ 2019 recurrent   . Cancer of renal pelvis, right [facility])    oncologist-  dr [name]--  high grade papillary urothelial carcinoma, Stage III (T3 Nx M0) -- 02-12-2017  s/p  left nephrectomy--- completed chemotherapy 07-27-2017  . Cataract   . Chemotherapy-induced neuropathy (HCC)    feet  . CKD (chronic kidney disease) stage 3, GFR 30-59 ml/min (HCC)   . Coronary artery disease    followed by Dr. Dorris [name]; Lexiscan Myoview (10/14):  Low risk, EF 57%, small inf infarct  . Coronary  vasospasm ([place]) PER DR Nhyla Nappi NOTE 03-22-2012--  INTER. TIGHTNESS   PER PT PROBABLE FROM STRESS  . Depression    no rx  . Dyslipidemia    pt unable to tolerate niaspan  . GAD (generalized anxiety disorder)   . GERD (gastroesophageal reflux disease)   . Hearing loss    per pt due to chemotherapy  . History of angina CHRONIC -- CONTROLLED W/ IMDUR  . History of cancer chemotherapy    completed 07-27-2017  for renal carcinoma  . 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 History of non-ST elevation myocardial infarction (NSTEMI) 2005  . Hyperlipidemia   . Hypertension   . Myocardial infarction ([place]) 2005  . Nocturia more than twice per night   . OSA on CPAP   . Pneumonia   . Sleep apnea   . Solitary right kidney    s/p left nephrectomy 02-12-2017  . Type 2 diabetes mellitus treated with insulin [facility]    endocrinologist-- dr Steffanie Dunn ([place] in [place])  lov note in epic   . Urgency of urination   . Wears glasses   . Wisdom teeth extracted     Past Surgical History:  Procedure Laterality Date  . APPENDECTOMY  08-03-2004  . CARDIAC CATHETERIZATION  x3  last one 06-17-2007   MILD NON-OBSTRUCTIVE CAD/ NORMAL LVF/ 30% LEFT RENAL ARTERY STENOSIS  . CARDIOVASCULAR STRESS TEST  02/23/2013   Low risk nuclear study w/ small inferior wall infarct at mid and basal level with no ischemia/  normal LV function and wall motion , ef 57%  . CYSTO/ BILATERAL RETROGRADE PYELOGRAM/ RIGHT URETEROSCOPIC LASER FULGURATION URETERAL TUMOR  02-16-2008  . CYSTO/ BLADDER BX  09-13-2008;  08-13-2007; 04-19-2007; 06-01-2006  . CYSTO/ CYSTOGRAM/ URETEROSCOPY  02-21-2009  . CYSTO/ RESECTION BLADDER TUMOR/ LEFT RETROGRADE PYELOGRAM/ RIGHT URETEROSCOPY  08-07-2010   TCC OF BLADDER   S/P DISTAL URETERECTOMY/ REIMPLANTATION  . CYSTO/ RIGHT URETEROSCOPY/ BX URETERAL TUMOR  10-11-2008  . CYSTOSCOPY W/ RETROGRADES  04/19/2012   Procedure: CYSTOSCOPY WITH RETROGRADE  PYELOGRAM;  Surgeon: Bernestine Amass, MD;  [facility];  Service: Urology;  Laterality: Bilateral;  30 MINS Cysto, Biopsy, possible TURBT, Bilateral retrograde pyelograms with Mitomycin C instilation Post op   . CYSTOSCOPY W/ RETROGRADES Left 03/25/2018   Procedure: CYSTOSCOPY WITH RETROGRADE PYELOGRAM;  Surgeon: [name], MD;  [facility];  Service: Urology;  Laterality: Left;  . CYSTOSCOPY WITH BIOPSY  04/07/2011   Procedure: CYSTOSCOPY WITH BIOPSY/ RIGHT URETEROSCOPY;  Surgeon: Bernestine Amass, MD;  Location: [facility];  Service: Urology;  Laterality: N/A;    cystoscopy, cystogram, biopsy and fulgeration  . CYSTOSCOPY WITH BIOPSY  12/29/2011   Procedure: CYSTOSCOPY WITH BIOPSY;  Surgeon: Bernestine Amass, MD;  Location: [facility];  Service: Urology;  Laterality: N/A;  [place]    . CYSTOSCOPY WITH RETROGRADE PYELOGRAM, URETEROSCOPY AND STENT PLACEMENT Left 04/28/2018   Procedure: LEFT RETROGRADE PYELOGRAM, DIAGNOSTIC LEFT URETEROSCOPY;  Surgeon: [name], MD;  Location: [facility];  Service: Urology;  Laterality: Left;  . CYSTOSCOPY WITH STENT PLACEMENT N/A 03/09/2020   Procedure: [place];  Surgeon: [name], MD;  Location: [facility];  Service: Urology;  Laterality: N/A;  . ELBOW SURGERY  07-07-2003   RIGHT ELBOW ARTHROSCOPY W/ OPEN RECONSTRUCTION  . IR FLUORO GUIDE PORT INSERTION RIGHT  04/01/2017  . IR US GUIDE VASC ACCESS RIGHT  04/01/2017  . KNEE ARTHROSCOPY W/ DEBRIDEMENT  02-08-2004   RIGHT KNEE  . OSTOMY N/A 03/09/2020   Procedure: POSSIBLE OSTOMY;  Surgeon: [name], MD;  Location: [facility];  Service: General;  Laterality: N/A;  . PANENDOSCOPY N/A 03/09/2020   Procedure: PANENDOSCOPY RIGID;  Surgeon: [name], MD;  Location: [facility];  Service: General;  Laterality: N/A;  . RHINOPLASTY W/ MODIFIED CARTILAGE GRAFT  05-17-2007    INTERNAL NASAL VALVE COLLAPSE/ OSA/ SEPTAL PERFERATION  . RIGHT DISTAL URETERECTOMY W/ REIMPLANTATION  10-30-2008   TCC OF RIGHT DISTAL URETER  . ROBOT ASSITED LAPAROSCOPIC NEPHROURETERECTOMY Right 02/12/2017   Procedure: XI ROBOT ASSITED LAPAROSCOPIC NEPHROURETERECTOMY;  Surgeon: [name], MD;  Location: [facility];  Service: Urology;  Laterality: Right;  . SHOULDER SURGERY  2005   RIGHT ROTATOR CUFF REPAIR  . TOTAL KNEE ARTHROPLASTY Left 05/18/2015  . TOTAL KNEE ARTHROPLASTY Left 05/18/2015   Procedure: TOTAL KNEE ARTHROPLASTY;  Surgeon: Earlie Server, MD;  Location: [facility];  Service: Orthopedics;  Laterality: Left;  . TOTAL KNEE ARTHROPLASTY Right 03/21/2016   Procedure: TOTAL KNEE ARTHROPLASTY;  Surgeon: Earlie Server, MD;  Location: [place];  Service: Orthopedics;  Laterality: Right;  . TRANSTHORACIC ECHOCARDIOGRAM  02/03/2017   ef 32-95%, grade 1 diastolic dysfunction/  trivial PR  . TRANSURETHRAL RESECTION OF BLADDER TUMOR  01-13-2007  . TRANSURETHRAL RESECTION OF BLADDER TUMOR  04/19/2012   Procedure: TRANSURETHRAL RESECTION OF BLADDER TUMOR (TURBT);  Surgeon: Bernestine Amass, MD;  Location: [facility];  Service: Urology;  Laterality: N/A;  . TRANSURETHRAL RESECTION OF BLADDER TUMOR N/A 03/25/2018   Procedure: TRANSURETHRAL RESECTION OF BLADDER TUMOR (TURBT);  Surgeon: [name], MD;  Location: [facility];  Service: Urology;  Laterality: N/A;  . TRANSURETHRAL RESECTION OF BLADDER TUMOR N/A 04/28/2018   Procedure: TRANSURETHRAL RESECTION OF BLADDER TUMOR (TURBT);  Surgeon: [name], MD;  [facility];  Service: Urology;  Laterality: N/A;  . umbillical hernia repair  2004     Social History:  The patient  reports that he quit smoking about 13 years ago. His smoking use included cigarettes. He quit after 20.00 years of use. He has never used smokeless tobacco. He reports previous alcohol use of about 3.0 - 4.0 standard drinks  of alcohol per week. He reports that he does not use drugs.   Family History:  The patient's family history includes Coronary artery disease in his father; Diabetes in his maternal grandmother; Heart disease in his paternal grandfather.    ROS:  Please see the history of present  illness. All other systems are reviewed and  Negative to the above problem except as noted.    PHYSICAL EXAM: VS:  BP 130/80   Pulse 91   Ht 5\' 9"  (1.753 m)   Wt 220 lb 6.4 oz (100 kg)   SpO2 96%   BMI 32.55 kg/m   GEN: Obese 65 yo , in no acute distress  HEENT: normal  Neck: no JVD, no carotid bruits Cardiac: RRR; no murmurs  No LE edema  Respiratory:  clear to auscultation bilaterally,  GI: soft, nontender, nondistended, + BS  No hepatomegaly  MS: no deformity Moving all extremities   Skin: warm and dry, no rash EKG:  EKG is ordered today.  SR with PACs  68 bpm     Lipid Panel    Component Value Date/Time   CHOL 123 03/07/2019 1038   TRIG 208 (H) 03/07/2019 1038   HDL 30 (L) 03/07/2019 1038   CHOLHDL 4.1 03/07/2019 1038   VLDL 34 (H) 10/10/2016 1043   LDLCALC 66 03/07/2019 1038   LDLDIRECT 96.0 07/20/2015 1035      Wt Readings from Last 3 Encounters:  06/19/20 220 lb 6.4 oz (100 kg)  05/07/20 214 lb 1.9 oz (97.1 kg)  05/07/20 214 lb (97.1 kg)      ASSESSMENT AND PLAN: 1  CAD   Pt denies anginal symptoms    WOuld continue current regimen     2  HL   Last lipids LDL 66  HDL 40  THis was 2020   WIll repeat  3    HTN   BP is OK    Continue   He has ques re Imdu and Cialis  He has tak  4  Hx of neuropathy  Encouraged him to wear shoes / slippers  5  Diet/weight   Discussed diet and changes he should make   He is interested in particpating in study with [employer]   WIll contact him      Current medicines are reviewed at length with the patient today.  The patient does not have concerns regarding medicines.  Signed, Isaac Carnes, MD  06/19/2020 [facility]  [facility], [place], [place]  [address] Phone: [phone]; Fax: [phone]

## 2020-06-18 NOTE — Patient Instructions (Signed)
Tunneled Central Venous Catheter Flushing Guide  It is important to flush your tunneled central venous catheter each time you use it, both before and after you use it. Flushing your catheter will help prevent it from clogging. What are the risks? Risks may include:  Infection.  Air getting into the catheter and bloodstream. Supplies needed:  A clean pair of gloves.  A disinfecting wipe. Use an alcohol wipe, chlorhexidine wipe, or iodine wipe as told by your health care provider.  A 10 mL syringe that has been prefilled with saline solution.  An empty 10 mL syringe, if a substance called heparin was injected into your catheter. How to flush your catheter When you flush your catheter, make sure you follow any specific instructions from your health care provider or the manufacturer. These are general guidelines. Flushing your catheter before use If there is heparin in your catheter: 1. Wash your hands with soap and water. 2. Put on gloves. 3. Scrub the injection cap for a minimum of 15 seconds with a disinfecting wipe. 4. Unclamp the catheter. 5. Attach the empty syringe to the injection cap. 6. Pull the syringe plunger back and withdraw 10 mL of blood. 7. Place the syringe into an appropriate waste container. 8. Scrub the injection cap for 15 seconds with a disinfecting wipe. 9. Attach the prefilled syringe to the injection cap. 10. Flush the catheter by pushing the plunger forward until all the liquid from the syringe is in the catheter. 11. Remove the syringe from the injection cap. 12. Clamp the catheter. If there is no heparin in your catheter: 1. Wash your hands with soap and water. 2. Put on gloves. 3. Scrub the injection cap for 15 seconds with a disinfecting wipe. 4. Unclamp the catheter. 5. Attach the prefilled syringe to the injection cap. 6. Flush the catheter by pushing the plunger forward until 5 mL of the liquid from the syringe is in the catheter. 7. Pull back on  the syringe until you see blood in the catheter. 8. If you have been asked to collect any blood, follow your health care provider's instructions. Otherwise, flush the catheter with the rest of the solution from the syringe. 9. Remove the syringe from the injection cap. 10. Clamp the catheter.   Flushing your catheter after use 1. Wash your hands with soap and water. 2. Put on gloves. 3. Scrub the injection cap for 15 seconds with a disinfecting wipe. 4. Unclamp the catheter. 5. Attach the prefilled syringe to the injection cap. 6. Flush the catheter by pushing the plunger forward until all of the liquid from the syringe is in the catheter. 7. Remove the syringe from the injection cap. 8. Clamp the catheter. Problems and solutions  If blood cannot be completely cleared from the injection cap, you may need to have the injection cap replaced.  If the catheter is difficult to flush, use the pulsing method. The pulsing method involves pushing only a few milliliters of solution into the catheter at a time and pausing between pushes.  If you do not see blood in the catheter when you pull back on the syringe, change your body position, such as by raising your arms above your head. Take a deep breath and cough. Then, pull back on the syringe. If you still do not see blood, flush the catheter with a small amount of solution. Then, change positions again and take a breath or cough. Pull back on the syringe again. If you still do not   see blood, finish flushing the catheter and contact your health care provider. Do not use your catheter until your health care provider says it is okay. General tips  Have someone help you flush your catheter, if possible.  Do not force fluid through your catheter.  Do not use a syringe that is larger or smaller than 10 mL. Using a smaller syringe can make the catheter burst.  Do not use your catheter without flushing it first if it has heparin in it. Contact a health  care provider if:  You cannot see any blood in the catheter when you flush it before using it.  Your catheter is difficult to flush. Get help right away if:  You cannot flush the catheter.  The catheter leaks when you flush it or when there is fluid in it.  There are cracks or breaks in the catheter. Summary  It is important to flush your tunneled central venous catheter each time you use it, both before and after you use it.  Scrub the injection cap for 15 seconds with a disinfecting wipe before and after you flush it.  When you flush your catheter, make sure you follow any specific instructions from your health care provider or the manufacturer.  Get help right away if you cannot flush the catheter. This information is not intended to replace advice given to you by your health care provider. Make sure you discuss any questions you have with your health care provider. Document Revised: 07/07/2019 Document Reviewed: 07/14/2018 Elsevier Patient Education  2021 Elsevier Inc.  

## 2020-06-19 ENCOUNTER — Ambulatory Visit: Payer: Medicare Other | Admitting: Internal Medicine

## 2020-06-19 VITALS — BP 130/80 | HR 91 | Ht 69.0 in | Wt 220.4 lb

## 2020-06-19 DIAGNOSIS — E782 Mixed hyperlipidemia: Secondary | ICD-10-CM

## 2020-06-19 DIAGNOSIS — G2581 Restless legs syndrome: Secondary | ICD-10-CM

## 2020-06-19 DIAGNOSIS — C678 Malignant neoplasm of overlapping sites of bladder: Secondary | ICD-10-CM | POA: Diagnosis not present

## 2020-06-19 DIAGNOSIS — I2510 Atherosclerotic heart disease of native coronary artery without angina pectoris: Secondary | ICD-10-CM

## 2020-06-19 MED ORDER — METOPROLOL SUCCINATE ER 25 MG PO TB24
ORAL_TABLET | ORAL | 3 refills | Status: DC
Start: 2020-06-19 — End: 2021-07-08

## 2020-06-19 NOTE — Patient Instructions (Signed)
Medication Instructions:  No changes *If you need a refill on your cardiac medications before your next appointment, please call your pharmacy*   Lab Work: Today: Lipids  If you have labs (blood work) drawn today and your tests are completely normal, you will receive your results only by: Marland Kitchen MyChart Message (if you have MyChart) OR . A paper copy in the mail If you have any lab test that is abnormal or we need to change your treatment, we will call you to review the results.   Testing/Procedures: none   Follow-Up: At [facility], you and your health needs are our priority.  As part of our continuing mission to provide you with exceptional heart care, we have created designated Provider Care Teams.  These Care Teams include your primary Cardiologist (physician) and Advanced Practice Providers (APPs -  Physician Assistants and Nurse Practitioners) who all work together to provide you with the care you need, when you need it.   Your next appointment:   9 month(s)  The format for your next appointment:   In Person  Provider:   You may see [name], MD or one of the following Advanced Practice Providers on your designated Care Team:    Richardson Dopp, PA-C  Robbie Lis, [place]    Other Instructions

## 2020-06-20 LAB — LIPID PANEL
Chol/HDL Ratio: 4.4 ratio (ref 0.0–5.0)
Cholesterol, Total: 110 mg/dL (ref 100–199)
HDL: 25 mg/dL — ABNORMAL LOW (ref >39)
LDL Chol Calc (NIH): 44 mg/dL (ref 0–99)
Triglycerides: 262 mg/dL — ABNORMAL HIGH (ref 0–149)
VLDL Cholesterol Cal: 41 mg/dL — ABNORMAL HIGH (ref 5–40)

## 2020-07-01 ENCOUNTER — Encounter: Payer: Self-pay | Admitting: Internal Medicine

## 2020-07-10 DIAGNOSIS — E1165 Type 2 diabetes mellitus with hyperglycemia: Secondary | ICD-10-CM | POA: Diagnosis not present

## 2020-07-10 DIAGNOSIS — E1142 Type 2 diabetes mellitus with diabetic polyneuropathy: Secondary | ICD-10-CM | POA: Diagnosis not present

## 2020-07-25 ENCOUNTER — Telehealth: Payer: Self-pay | Admitting: Internal Medicine

## 2020-07-25 NOTE — Telephone Encounter (Signed)
Patient called and mentioned that he would like to speak with Dr. Harrington Challenger or nurse. Has some things he wants to talk about. Please call back

## 2020-07-25 NOTE — Telephone Encounter (Signed)
Patient calling to give information that he received from his insurance company. Did not want me to take message, states he will give it to [facility] when she calls.

## 2020-07-25 NOTE — Telephone Encounter (Signed)
Spoke with patient.  He said the letter that Dr. Harrington Challenger provided re: bathroom equipment needs to be submitted to [employer] directly from our office and not from the patient.  He does not have contact info on where to send it at this time.  He will get that info and call back so we know where to send it.  Also, he would like to take medication for ED, he said Dr. Harrington Challenger said it can't be taken with his IMDUR 120 mg.  He very rarely has chest pain on this dose.   He has never be on Ranexa.

## 2020-07-26 NOTE — Telephone Encounter (Signed)
Patient can try switch to amlodipine 1/2 of 5 mg tab     Stop imdur     Would need to let it wash out over about a week before using anytning like viagra.

## 2020-07-31 ENCOUNTER — Other Ambulatory Visit: Payer: Self-pay | Admitting: *Deleted

## 2020-07-31 MED ORDER — ROSUVASTATIN CALCIUM 10 MG PO TABS
10.0000 mg | ORAL_TABLET | ORAL | 3 refills | Status: DC
Start: 2020-07-31 — End: 2021-10-09

## 2020-08-03 MED ORDER — AMLODIPINE BESYLATE 5 MG PO TABS: 2.5000 mg | ORAL_TABLET | Freq: Every day | ORAL | 3 refills | Status: DC

## 2020-08-03 NOTE — Telephone Encounter (Signed)
Faxed letter from Dr. Harrington Challenger to Care Management at [phone] per patient's request.  The phone number if needed is [phone] option 6.  I instructed him on stopping IMDUR and starting amlodipine - half of 5 mg tablet instead.  His urologist had prescribed cialis 5 mg previously and he would like Dr. Harrington Challenger to prescribe now.

## 2020-08-03 NOTE — Telephone Encounter (Signed)
OK to try Cialis 5 mg prn

## 2020-08-06 MED ORDER — TADALAFIL 5 MG PO TABS: 5.0000 mg | ORAL_TABLET | Freq: Every day | ORAL | 0 refills | Status: DC | PRN

## 2020-08-06 NOTE — Telephone Encounter (Signed)
Sent prescription for Cialis 5 mg daily as needed to [facility]. Pt notified via [place].

## 2020-08-07 ENCOUNTER — Other Ambulatory Visit: Payer: Self-pay | Admitting: *Deleted

## 2020-08-07 ENCOUNTER — Encounter: Payer: Self-pay | Admitting: Family

## 2020-08-07 ENCOUNTER — Other Ambulatory Visit: Payer: Self-pay

## 2020-08-07 ENCOUNTER — Inpatient Hospital Stay: Payer: Medicare Other | Admitting: Family

## 2020-08-07 ENCOUNTER — Inpatient Hospital Stay: Payer: Medicare Other | Attending: Hematology & Oncology

## 2020-08-07 ENCOUNTER — Inpatient Hospital Stay: Payer: Medicare Other

## 2020-08-07 ENCOUNTER — Other Ambulatory Visit: Payer: Self-pay | Admitting: Family

## 2020-08-07 VITALS — BP 137/80 | HR 64 | Temp 98.6°F | Resp 18 | Ht 69.0 in | Wt 220.4 lb

## 2020-08-07 DIAGNOSIS — G620 Drug-induced polyneuropathy: Secondary | ICD-10-CM

## 2020-08-07 DIAGNOSIS — D696 Thrombocytopenia, unspecified: Secondary | ICD-10-CM

## 2020-08-07 DIAGNOSIS — Z452 Encounter for adjustment and management of vascular access device: Secondary | ICD-10-CM | POA: Insufficient documentation

## 2020-08-07 DIAGNOSIS — D508 Other iron deficiency anemias: Secondary | ICD-10-CM

## 2020-08-07 DIAGNOSIS — Z01818 Encounter for other preprocedural examination: Secondary | ICD-10-CM | POA: Diagnosis not present

## 2020-08-07 DIAGNOSIS — T451X5A Adverse effect of antineoplastic and immunosuppressive drugs, initial encounter: Secondary | ICD-10-CM

## 2020-08-07 DIAGNOSIS — Z95828 Presence of other vascular implants and grafts: Secondary | ICD-10-CM

## 2020-08-07 DIAGNOSIS — Z8551 Personal history of malignant neoplasm of bladder: Secondary | ICD-10-CM | POA: Diagnosis not present

## 2020-08-07 DIAGNOSIS — C670 Malignant neoplasm of trigone of bladder: Secondary | ICD-10-CM

## 2020-08-07 LAB — CBC WITH DIFFERENTIAL (CANCER CENTER ONLY)
Abs Immature Granulocytes: 0.02 10*3/uL (ref 0.00–0.07)
Basophils Absolute: 0 10*3/uL (ref 0.0–0.1)
Basophils Relative: 1 %
Eosinophils Absolute: 0.1 10*3/uL (ref 0.0–0.5)
Eosinophils Relative: 2 %
HCT: 42.4 % (ref 39.0–52.0)
Hemoglobin: 13.9 g/dL (ref 13.0–17.0)
Immature Granulocytes: 1 %
Lymphocytes Relative: 36 %
Lymphs Abs: 1.6 10*3/uL (ref 0.7–4.0)
MCH: 28.5 pg (ref 26.0–34.0)
MCHC: 32.8 g/dL (ref 30.0–36.0)
MCV: 86.9 fL (ref 80.0–100.0)
Monocytes Absolute: 0.4 10*3/uL (ref 0.1–1.0)
Monocytes Relative: 10 %
Neutro Abs: 2.2 10*3/uL (ref 1.7–7.7)
Neutrophils Relative %: 50 %
Platelet Count: 117 10*3/uL — ABNORMAL LOW (ref 150–400)
RBC: 4.88 MIL/uL (ref 4.22–5.81)
RDW: 15.5 % (ref 11.5–15.5)
WBC Count: 4.3 10*3/uL (ref 4.0–10.5)
nRBC: 0 % (ref 0.0–0.2)

## 2020-08-07 LAB — CMP (CANCER CENTER ONLY)
ALT: 43 U/L (ref 0–44)
AST: 32 U/L (ref 15–41)
Albumin: 4.2 g/dL (ref 3.5–5.0)
Alkaline Phosphatase: 56 U/L (ref 38–126)
Anion gap: 9 (ref 5–15)
BUN: 16 mg/dL (ref 8–23)
CO2: 25 mmol/L (ref 22–32)
Calcium: 9.3 mg/dL (ref 8.9–10.3)
Chloride: 103 mmol/L (ref 98–111)
Creatinine: 1.86 mg/dL — ABNORMAL HIGH (ref 0.61–1.24)
GFR, Estimated: 40 mL/min — ABNORMAL LOW (ref >60)
Glucose, Bld: 135 mg/dL — ABNORMAL HIGH (ref 70–99)
Potassium: 4.8 mmol/L (ref 3.5–5.1)
Sodium: 137 mmol/L (ref 135–145)
Total Bilirubin: 0.6 mg/dL (ref 0.3–1.2)
Total Protein: 7 g/dL (ref 6.5–8.1)

## 2020-08-07 LAB — LACTATE DEHYDROGENASE: LDH: 140 U/L (ref 98–192)

## 2020-08-07 LAB — SAVE SMEAR(SSMR), FOR PROVIDER SLIDE REVIEW

## 2020-08-07 LAB — PLATELET BY CITRATE

## 2020-08-07 MED ORDER — GABAPENTIN 300 MG PO CAPS: 300.0000 mg | ORAL_CAPSULE | Freq: Two times a day (BID) | ORAL | 1 refills | Status: DC

## 2020-08-07 MED ORDER — SODIUM CHLORIDE 0.9% FLUSH
10.0000 mL | Freq: Once | INTRAVENOUS | Status: AC
  Administered 2020-08-07: 10 mL via INTRAVENOUS
  Filled 2020-08-07: qty 10

## 2020-08-07 MED ORDER — HEPARIN SOD (PORK) LOCK FLUSH 100 UNIT/ML IV SOLN
500.0000 [IU] | Freq: Once | INTRAVENOUS | Status: AC
  Administered 2020-08-07: 500 [IU] via INTRAVENOUS
  Filled 2020-08-07: qty 5

## 2020-08-07 MED FILL — GABAPENTIN 300 MG CAPSULE: 300 | 30 days supply | Qty: 60 | Fill #0

## 2020-08-07 MED FILL — TAMSULOSIN HCL 0.4 MG CAP: 0.4 | 30 days supply | Qty: 60 | Fill #1

## 2020-08-07 NOTE — Patient Instructions (Signed)

## 2020-08-07 NOTE — Progress Notes (Signed)
Hematology and Oncology Follow Up Visit  Isaac [name] [id number] [birth year] 65 y.o. 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 is here today for follow-up. He continues to do well but will be having a bilateral inguinal hernia repair this week on Friday with Dr. Johney Maine. He has some lower abdominal discomfort with certain movements associated with the hernias.  He notes fatigue at times.  He has dizziness when he stands too quickly or shuts his eyes which he attributes to some of his medications as well as the neuropathy in his feet.  The neuropathy in his neuropathy is effecting his balance. He has not had a fall or syncope so far. He has been on Neurontin in the past but is not currently taking anything.  No fever, chills, n/v, cough, rash, SOB, chest pain, palpitations or changes in bowel or bladder habits.  No blood loss noted. No abnormal bruising, no petechiae.  No swelling in his extremities at this time.  He has maintained a good appetite and is staying well hydrated. His weight is stable.   ECOG Performance Status: 1 - Symptomatic but completely ambulatory  Medications:  Allergies as of 08/07/2020      Reactions   Hydrocodone Itching, Nausea And Vomiting   Nsaids Other (See Comments)   Has 1 kidney left   Dilaudid [hydromorphone] Nausea And Vomiting      Medication List       Accurate as of August 07, 2020  1:46 PM. If you have any questions, ask your nurse or doctor.        acetaminophen 500 MG tablet Commonly known as: TYLENOL Take 500 mg by mouth every 6 (six) hours as needed for moderate pain or headache.   ALPRAZolam 1 MG tablet Commonly known as: XANAX Take 1 tablet (1 mg total)  by mouth 3 (three) times daily as needed for anxiety.   amLODipine 5 MG tablet Commonly known as: NORVASC Take 0.5 tablets (2.5 mg total) by mouth daily.   aspirin EC 81 MG tablet Take 81 mg by mouth at bedtime.   ciclopirox 8 % solution Commonly known as: PENLAC Apply 1 application topically at bedtime as needed (toenail fungus). APPLY ON NAIL AND SURROUNDING SKIN OVER PREVIOUS COAT. AFTER 7 DAYS REMOVE WITH ALCOHOL AND REPEAT CYCLE.   diazepam 10 MG tablet Commonly known as: VALIUM 10 mg. Insert 10 mg rectally at night as needed bowel relaxation   finasteride 5 MG tablet Commonly known as: PROSCAR Take 5 mg by mouth daily.   glimepiride 2 MG tablet Commonly known as: AMARYL TAKE 1 BY MOUTH DAILY BEFORE SUPPER What changed:   how much to take  how to take this  when to take this  additional instructions   insulin glargine (1 Unit Dial) 300 UNIT/ML Solostar Pen Commonly known as: TOUJEO Inject 40 Units into the skin 2 (two) times daily.   lidocaine-prilocaine cream Commonly known as: EMLA Apply topically.   metoprolol succinate 25 MG 24 hr tablet Commonly known as: TOPROL-XL TAKE 1 TABLET BY MOUTH AT BEDTIME. GENERIC EQUIVALENT FOR TOPROL-XL   nitroGLYCERIN 0.4 MG SL tablet Commonly known as: NITROSTAT Place 1 tablet (0.4 mg total) under the tongue every 5 (five) minutes as needed for chest pain. Repeat every 5 minute up to 3 doses if  no relief call 911.   ondansetron 4 MG tablet Commonly known as: ZOFRAN Take 4 mg by mouth every 8 (eight) hours as needed for nausea/vomiting.   Ozempic (0.25 or 0.5 MG/DOSE) 2 MG/1.5ML Sopn Generic drug: Semaglutide(0.25 or 0.5MG /DOS) Inject into the skin.   pantoprazole 40 MG tablet Commonly known as: PROTONIX Take 1 tablet (40 mg total) by mouth every morning.   PRESCRIPTION MEDICATION Inhale into the lungs at bedtime. CPAP   rOPINIRole 2 MG tablet Commonly known as: REQUIP TAKE 1 TABLET BY MOUTH AT BEDTIME    rosuvastatin 10 MG tablet Commonly known as: CRESTOR Take 1 tablet (10 mg total) by mouth every other day.   tadalafil 5 MG tablet Commonly known as: Cialis Take 1 tablet (5 mg total) by mouth daily as needed for erectile dysfunction.   tamsulosin 0.4 MG Caps capsule Commonly known as: FLOMAX Take 0.4 mg by mouth daily.       Allergies:  Allergies  Allergen Reactions  . Hydrocodone Itching and Nausea And Vomiting  . Nsaids Other (See Comments)    Has 1 kidney left  . Dilaudid [Hydromorphone] Nausea And Vomiting    Past Medical History, Surgical history, Social history, and Family History were reviewed and updated.  Review of Systems: All other 10 point review of systems is negative.   Physical Exam:  vitals were not taken for this visit.   Wt Readings from Last 3 Encounters:  06/19/20 220 lb 6.4 oz (100 kg)  05/07/20 214 lb 1.9 oz (97.1 kg)  05/07/20 214 lb (97.1 kg)    Ocular: Sclerae unicteric, pupils equal, round and reactive to light Ear-nose-throat: Oropharynx clear, dentition fair Lymphatic: No cervical or supraclavicular adenopathy Lungs no rales or rhonchi, good excursion bilaterally Heart regular rate and rhythm, no murmur appreciated Abd soft, nontender, positive bowel sounds MSK no focal spinal tenderness, no joint edema Neuro: non-focal, well-oriented, appropriate affect Breasts: Deferred   Lab Results  Component Value Date   WBC 4.1 05/07/2020   HGB 12.4 (L) 05/07/2020   HCT 37.8 (L) 05/07/2020   MCV 91.3 05/07/2020   PLT 113 (L) 05/07/2020   Lab Results  Component Value Date   FERRITIN 52 07/06/2018   IRON 98 07/06/2018   TIBC 371 07/06/2018   UIBC 273 07/06/2018   IRONPCTSAT 27 07/06/2018   Lab Results  Component Value Date   RETICCTPCT 2.2 (H) 10/15/2017   RBC 4.14 (L) 05/07/2020   No results found for: Nils Pyle [employer] Lab Results  Component Value Date   IGA 232 01/25/2015   No results found for:  Odetta Pink, SPEI   Chemistry      Component Value Date/Time   NA 137 08/07/2020 1307   NA 134 05/04/2017 0925   K 4.8 08/07/2020 1307   K 4.5 05/04/2017 0925   CL 103 08/07/2020 1307   CL 97 (L) 05/04/2017 0925   CO2 25 08/07/2020 1307   CO2 23 05/04/2017 0925   BUN 16 08/07/2020 1307   BUN 28 (H) 05/04/2017 0925   CREATININE 1.86 (H) 08/07/2020 1307   CREATININE 1.95 (H) 03/07/2019 1038   GLU 86 03/31/2016 0000      Component Value Date/Time   CALCIUM 9.3 08/07/2020 1307   CALCIUM 9.3 05/04/2017 0925   ALKPHOS 56 08/07/2020 1307   ALKPHOS 58 05/04/2017 0925   AST 32 08/07/2020 1307   ALT 43 08/07/2020 1307   ALT 90 (H) 05/04/2017 3295  BILITOT 0.6 08/07/2020 1307       Impression and Plan: [name] is a very pleasant 65yo caucasian gentleman with infiltrating high grade papillary urothelial carcinoma with right nephrectomy in October 2018and adjuvant chemotherapy. He continues to do well and so far there has been no evidence of recurrence.  No scans needed per MD unless patient is symptomatic.  We will have him try Neurontin 300 mg PO BID for neuropathy. Prescription sent.  We will continue to follow along and plan plan to see him again in 3 months.  Port flush every 6 weeks.  He was encouraged to contact our office with any questions or concerns. We can certainly see him sooner if needed.   Laverna Peace, NP 3/29/20221:46 PM

## 2020-08-10 ENCOUNTER — Other Ambulatory Visit (HOSPITAL_BASED_OUTPATIENT_CLINIC_OR_DEPARTMENT_OTHER): Payer: Self-pay | Admitting: Surgery

## 2020-08-10 DIAGNOSIS — K4020 Bilateral inguinal hernia, without obstruction or gangrene, not specified as recurrent: Secondary | ICD-10-CM | POA: Diagnosis not present

## 2020-08-10 MED FILL — traMADol HCL 50 MG TABS: 50 | 3 days supply | Qty: 20 | Fill #0

## 2020-08-13 ENCOUNTER — Other Ambulatory Visit: Payer: Self-pay | Admitting: Surgery

## 2020-08-13 ENCOUNTER — Other Ambulatory Visit (HOSPITAL_BASED_OUTPATIENT_CLINIC_OR_DEPARTMENT_OTHER): Payer: Self-pay

## 2020-08-13 MED ORDER — TRAMADOL HCL 50 MG PO TABS
ORAL_TABLET | ORAL | 0 refills | Status: DC
  Filled 2020-08-13: qty 20, 3d supply, fill #0

## 2020-08-15 ENCOUNTER — Other Ambulatory Visit (HOSPITAL_BASED_OUTPATIENT_CLINIC_OR_DEPARTMENT_OTHER): Payer: Self-pay

## 2020-08-20 ENCOUNTER — Other Ambulatory Visit (HOSPITAL_COMMUNITY): Payer: Medicare Other

## 2020-08-23 ENCOUNTER — Ambulatory Visit (HOSPITAL_BASED_OUTPATIENT_CLINIC_OR_DEPARTMENT_OTHER): Admit: 2020-08-23 | Payer: Medicare Other | Admitting: Surgery

## 2020-08-23 ENCOUNTER — Encounter (HOSPITAL_BASED_OUTPATIENT_CLINIC_OR_DEPARTMENT_OTHER): Payer: Self-pay

## 2020-08-23 SURGERY — REPAIR, HERNIA, INGUINAL, LAPAROSCOPIC
Anesthesia: General | Laterality: Bilateral

## 2020-08-31 DIAGNOSIS — E1142 Type 2 diabetes mellitus with diabetic polyneuropathy: Secondary | ICD-10-CM | POA: Diagnosis not present

## 2020-08-31 DIAGNOSIS — G620 Drug-induced polyneuropathy: Secondary | ICD-10-CM | POA: Diagnosis not present

## 2020-08-31 DIAGNOSIS — B351 Tinea unguium: Secondary | ICD-10-CM | POA: Diagnosis not present

## 2020-08-31 DIAGNOSIS — M21621 Bunionette of right foot: Secondary | ICD-10-CM | POA: Diagnosis not present

## 2020-09-06 ENCOUNTER — Other Ambulatory Visit (HOSPITAL_BASED_OUTPATIENT_CLINIC_OR_DEPARTMENT_OTHER): Payer: Self-pay

## 2020-09-06 MED FILL — Finasteride Tab 5 MG: ORAL | 90 days supply | Qty: 90 | Fill #0 | Status: AC

## 2020-09-06 MED FILL — Tamsulosin HCl Cap 0.4 MG: ORAL | 30 days supply | Qty: 60 | Fill #0 | Status: AC

## 2020-09-09 MED FILL — Gabapentin Cap 300 MG: ORAL | 30 days supply | Qty: 60 | Fill #0 | Status: AC

## 2020-09-10 ENCOUNTER — Other Ambulatory Visit (HOSPITAL_BASED_OUTPATIENT_CLINIC_OR_DEPARTMENT_OTHER): Payer: Self-pay

## 2020-09-11 ENCOUNTER — Inpatient Hospital Stay: Payer: Medicare Other | Attending: Hematology & Oncology

## 2020-09-11 ENCOUNTER — Other Ambulatory Visit: Payer: Self-pay

## 2020-09-11 VITALS — BP 123/77 | HR 71 | Resp 18

## 2020-09-11 DIAGNOSIS — Z95828 Presence of other vascular implants and grafts: Secondary | ICD-10-CM

## 2020-09-11 DIAGNOSIS — Z8551 Personal history of malignant neoplasm of bladder: Secondary | ICD-10-CM | POA: Diagnosis not present

## 2020-09-11 DIAGNOSIS — Z452 Encounter for adjustment and management of vascular access device: Secondary | ICD-10-CM | POA: Insufficient documentation

## 2020-09-11 MED ORDER — SODIUM CHLORIDE 0.9% FLUSH
10.0000 mL | Freq: Once | INTRAVENOUS | Status: AC
  Administered 2020-09-11: 10 mL via INTRAVENOUS
  Filled 2020-09-11: qty 10

## 2020-09-11 MED ORDER — HEPARIN SOD (PORK) LOCK FLUSH 100 UNIT/ML IV SOLN
500.0000 [IU] | Freq: Once | INTRAVENOUS | Status: AC
  Administered 2020-09-11: 500 [IU] via INTRAVENOUS
  Filled 2020-09-11: qty 5

## 2020-09-11 NOTE — Patient Instructions (Signed)
Implanted Port Insertion, Care After This sheet gives you information about how to care for yourself after your procedure. Your health care provider may also give you more specific instructions. If you have problems or questions, contact your health care provider. What can I expect after the procedure? After the procedure, it is common to have:  Discomfort at the port insertion site.  Bruising on the skin over the port. This should improve over 3-4 days. Follow these instructions at home: Port care  After your port is placed, you will get a manufacturer's information card. The card has information about your port. Keep this card with you at all times.  Take care of the port as told by your health care provider. Ask your health care provider if you or a family member can get training for taking care of the port at home. A home health care nurse may also take care of the port.  Make sure to remember what type of port you have. Incision care  Follow instructions from your health care provider about how to take care of your port insertion site. Make sure you: ? Wash your hands with soap and water before and after you change your bandage (dressing). If soap and water are not available, use hand sanitizer. ? Change your dressing as told by your health care provider. ? Leave stitches (sutures), skin glue, or adhesive strips in place. These skin closures may need to stay in place for 2 weeks or longer. If adhesive strip edges start to loosen and curl up, you may trim the loose edges. Do not remove adhesive strips completely unless your health care provider tells you to do that.  Check your port insertion site every day for signs of infection. Check for: ? Redness, swelling, or pain. ? Fluid or blood. ? Warmth. ? Pus or a bad smell.      Activity  Return to your normal activities as told by your health care provider. Ask your health care provider what activities are safe for you.  Do not  lift anything that is heavier than 10 lb (4.5 kg), or the limit that you are told, until your health care provider says that it is safe. General instructions  Take over-the-counter and prescription medicines only as told by your health care provider.  Do not take baths, swim, or use a hot tub until your health care provider approves. Ask your health care provider if you may take showers. You may only be allowed to take sponge baths.  Do not drive for 24 hours if you were given a sedative during your procedure.  Wear a medical alert bracelet in case of an emergency. This will tell any health care providers that you have a port.  Keep all follow-up visits as told by your health care provider. This is important. Contact a health care provider if:  You cannot flush your port with saline as directed, or you cannot draw blood from the port.  You have a fever or chills.  You have redness, swelling, or pain around your port insertion site.  You have fluid or blood coming from your port insertion site.  Your port insertion site feels warm to the touch.  You have pus or a bad smell coming from the port insertion site. Get help right away if:  You have chest pain or shortness of breath.  You have bleeding from your port that you cannot control. Summary  Take care of the port as told by your   health care provider. Keep the manufacturer's information card with you at all times.  Change your dressing as told by your health care provider.  Contact a health care provider if you have a fever or chills or if you have redness, swelling, or pain around your port insertion site.  Keep all follow-up visits as told by your health care provider. This information is not intended to replace advice given to you by your health care provider. Make sure you discuss any questions you have with your health care provider. Document Revised: 11/24/2017 Document Reviewed: 11/24/2017 Elsevier Patient Education   2021 Elsevier Inc.  

## 2020-09-18 DIAGNOSIS — G4733 Obstructive sleep apnea (adult) (pediatric): Secondary | ICD-10-CM | POA: Diagnosis not present

## 2020-09-26 ENCOUNTER — Other Ambulatory Visit (HOSPITAL_BASED_OUTPATIENT_CLINIC_OR_DEPARTMENT_OTHER): Payer: Self-pay

## 2020-09-26 MED ORDER — TADALAFIL 5 MG PO TABS
ORAL_TABLET | ORAL | 3 refills | Status: DC
  Filled 2020-09-26: qty 30, 120d supply, fill #0
  Filled 2020-10-01: qty 30, 30d supply, fill #0
  Filled 2020-11-03: qty 30, 30d supply, fill #1
  Filled 2020-12-17: qty 30, 30d supply, fill #2

## 2020-10-01 ENCOUNTER — Other Ambulatory Visit (HOSPITAL_BASED_OUTPATIENT_CLINIC_OR_DEPARTMENT_OTHER): Payer: Self-pay

## 2020-10-01 ENCOUNTER — Encounter: Payer: Self-pay | Admitting: Family

## 2020-10-10 DIAGNOSIS — E1140 Type 2 diabetes mellitus with diabetic neuropathy, unspecified: Secondary | ICD-10-CM | POA: Diagnosis not present

## 2020-10-10 DIAGNOSIS — E1165 Type 2 diabetes mellitus with hyperglycemia: Secondary | ICD-10-CM | POA: Diagnosis not present

## 2020-10-10 DIAGNOSIS — E1142 Type 2 diabetes mellitus with diabetic polyneuropathy: Secondary | ICD-10-CM | POA: Diagnosis not present

## 2020-10-10 DIAGNOSIS — Z794 Long term (current) use of insulin: Secondary | ICD-10-CM | POA: Diagnosis not present

## 2020-10-15 ENCOUNTER — Other Ambulatory Visit: Payer: Self-pay

## 2020-10-15 MED ORDER — PANTOPRAZOLE SODIUM 40 MG PO TBEC: 40.0000 mg | DELAYED_RELEASE_TABLET | Freq: Every morning | ORAL | 2 refills | Status: DC

## 2020-10-30 ENCOUNTER — Telehealth: Payer: Self-pay | Admitting: Internal Medicine

## 2020-10-30 NOTE — Telephone Encounter (Signed)
Pt c/o swelling: STAT is pt has developed SOB within 24 hours  If swelling, where is the swelling located? Feet and legs   How much weight have you gained and in what time span? No weight gain   Have you gained 3 pounds in a day or 5 pounds in a week? no  Do you have a log of your daily weights (if so, list)? no  Are you currently taking a fluid pill? no  Are you currently SOB? no  Have you traveled recently? No     pt called in stating that amLODipine (NORVASC) 5 MG tablet is causing swelling in his legs and feet.

## 2020-10-30 NOTE — Telephone Encounter (Signed)
Pt has stopped amlodipine because of leg swelling.  Off for about a week.  Swelling improving.  Thinks this happened previously on amlodipine.  Switched to this off of isosorbide so that he could take cialis.  He was using both without any problems previously.    Since stopping amlodipine has had a few episodes of chest pain, relieved each episode w one ntg.  Aware I will forward to Dr. Harrington Challenger and to PharmD for any recommendations.

## 2020-10-30 NOTE — Telephone Encounter (Signed)
Looks as though amlodipine was being used more for angina rather than BP control. Ranexa could be another option and does not interact with his Cialis. No other major drug interactions with Ranexa and his other meds either. Will defer to Dr Harrington Challenger for input though.

## 2020-11-03 MED FILL — Tamsulosin HCl Cap 0.4 MG: ORAL | 30 days supply | Qty: 60 | Fill #1 | Status: AC

## 2020-11-05 ENCOUNTER — Other Ambulatory Visit (HOSPITAL_BASED_OUTPATIENT_CLINIC_OR_DEPARTMENT_OTHER): Payer: Self-pay

## 2020-11-05 ENCOUNTER — Encounter: Payer: Self-pay | Admitting: Family

## 2020-11-05 NOTE — Telephone Encounter (Signed)
Pt did not tolerate amlodipine due to swelling in ankles REcomm trial of Ranexa 500 bid   I would recomm EKG in 10 days  Call pt re which pharmacy (local vs mail)   Need to check on coverage   ? If have samples

## 2020-11-06 NOTE — Telephone Encounter (Signed)
Spoke to the patient about Ranexa and dicussed good Rx for pricing. Patient wishes to make some phone calls to other pharmacy's and his insurance before medication is ordered. Patient to call office back today.

## 2020-11-06 NOTE — Telephone Encounter (Signed)
Follow up: ° ° °Patient returning call back °

## 2020-11-06 NOTE — Telephone Encounter (Signed)
Patient said his insurance company is going to call us.

## 2020-11-07 ENCOUNTER — Other Ambulatory Visit (HOSPITAL_BASED_OUTPATIENT_CLINIC_OR_DEPARTMENT_OTHER): Payer: Self-pay

## 2020-11-07 ENCOUNTER — Encounter: Payer: Self-pay | Admitting: Hematology & Oncology

## 2020-11-07 ENCOUNTER — Inpatient Hospital Stay: Payer: Medicare Other

## 2020-11-07 ENCOUNTER — Other Ambulatory Visit: Payer: Self-pay

## 2020-11-07 ENCOUNTER — Ambulatory Visit: Payer: Medicare Other | Attending: Internal Medicine

## 2020-11-07 ENCOUNTER — Telehealth: Payer: Self-pay

## 2020-11-07 ENCOUNTER — Inpatient Hospital Stay: Payer: Medicare Other | Attending: Hematology & Oncology

## 2020-11-07 ENCOUNTER — Inpatient Hospital Stay: Payer: Medicare Other | Admitting: Hematology & Oncology

## 2020-11-07 VITALS — BP 112/70 | HR 77 | Temp 98.5°F | Resp 18 | Wt 222.4 lb

## 2020-11-07 VITALS — BP 112/70 | HR 77 | Temp 98.3°F | Resp 18 | Wt 222.0 lb

## 2020-11-07 DIAGNOSIS — C651 Malignant neoplasm of right renal pelvis: Secondary | ICD-10-CM

## 2020-11-07 DIAGNOSIS — G620 Drug-induced polyneuropathy: Secondary | ICD-10-CM

## 2020-11-07 DIAGNOSIS — Z23 Encounter for immunization: Secondary | ICD-10-CM

## 2020-11-07 DIAGNOSIS — Z452 Encounter for adjustment and management of vascular access device: Secondary | ICD-10-CM | POA: Insufficient documentation

## 2020-11-07 DIAGNOSIS — Z7982 Long term (current) use of aspirin: Secondary | ICD-10-CM | POA: Insufficient documentation

## 2020-11-07 DIAGNOSIS — R112 Nausea with vomiting, unspecified: Secondary | ICD-10-CM | POA: Diagnosis not present

## 2020-11-07 DIAGNOSIS — Z79899 Other long term (current) drug therapy: Secondary | ICD-10-CM | POA: Diagnosis not present

## 2020-11-07 DIAGNOSIS — T451X5A Adverse effect of antineoplastic and immunosuppressive drugs, initial encounter: Secondary | ICD-10-CM

## 2020-11-07 DIAGNOSIS — Z8551 Personal history of malignant neoplasm of bladder: Secondary | ICD-10-CM | POA: Diagnosis not present

## 2020-11-07 DIAGNOSIS — F419 Anxiety disorder, unspecified: Secondary | ICD-10-CM | POA: Diagnosis not present

## 2020-11-07 DIAGNOSIS — E119 Type 2 diabetes mellitus without complications: Secondary | ICD-10-CM | POA: Insufficient documentation

## 2020-11-07 DIAGNOSIS — Z95828 Presence of other vascular implants and grafts: Secondary | ICD-10-CM

## 2020-11-07 DIAGNOSIS — C670 Malignant neoplasm of trigone of bladder: Secondary | ICD-10-CM

## 2020-11-07 DIAGNOSIS — D696 Thrombocytopenia, unspecified: Secondary | ICD-10-CM

## 2020-11-07 LAB — CMP (CANCER CENTER ONLY)
ALT: 30 U/L (ref 0–44)
AST: 25 U/L (ref 15–41)
Albumin: 4.2 g/dL (ref 3.5–5.0)
Alkaline Phosphatase: 59 U/L (ref 38–126)
Anion gap: 8 (ref 5–15)
BUN: 28 mg/dL — ABNORMAL HIGH (ref 8–23)
CO2: 27 mmol/L (ref 22–32)
Calcium: 9.3 mg/dL (ref 8.9–10.3)
Chloride: 101 mmol/L (ref 98–111)
Creatinine: 1.68 mg/dL — ABNORMAL HIGH (ref 0.61–1.24)
GFR, Estimated: 45 mL/min — ABNORMAL LOW (ref >60)
Glucose, Bld: 145 mg/dL — ABNORMAL HIGH (ref 70–99)
Potassium: 4.5 mmol/L (ref 3.5–5.1)
Sodium: 136 mmol/L (ref 135–145)
Total Bilirubin: 0.5 mg/dL (ref 0.3–1.2)
Total Protein: 6.5 g/dL (ref 6.5–8.1)

## 2020-11-07 LAB — CBC WITH DIFFERENTIAL (CANCER CENTER ONLY)
Abs Immature Granulocytes: 0.02 10*3/uL (ref 0.00–0.07)
Basophils Absolute: 0 10*3/uL (ref 0.0–0.1)
Basophils Relative: 1 %
Eosinophils Absolute: 0.1 10*3/uL (ref 0.0–0.5)
Eosinophils Relative: 3 %
HCT: 37.1 % — ABNORMAL LOW (ref 39.0–52.0)
Hemoglobin: 11.8 g/dL — ABNORMAL LOW (ref 13.0–17.0)
Immature Granulocytes: 1 %
Lymphocytes Relative: 37 %
Lymphs Abs: 1.5 10*3/uL (ref 0.7–4.0)
MCH: 28 pg (ref 26.0–34.0)
MCHC: 31.8 g/dL (ref 30.0–36.0)
MCV: 88.1 fL (ref 80.0–100.0)
Monocytes Absolute: 0.5 10*3/uL (ref 0.1–1.0)
Monocytes Relative: 13 %
Neutro Abs: 1.8 10*3/uL (ref 1.7–7.7)
Neutrophils Relative %: 45 %
Platelet Count: 130 10*3/uL — ABNORMAL LOW (ref 150–400)
RBC: 4.21 MIL/uL — ABNORMAL LOW (ref 4.22–5.81)
RDW: 14 % (ref 11.5–15.5)
WBC Count: 3.9 10*3/uL — ABNORMAL LOW (ref 4.0–10.5)
nRBC: 0 % (ref 0.0–0.2)

## 2020-11-07 LAB — SAVE SMEAR(SSMR), FOR PROVIDER SLIDE REVIEW

## 2020-11-07 LAB — PLATELET BY CITRATE

## 2020-11-07 LAB — LACTATE DEHYDROGENASE: LDH: 138 U/L (ref 98–192)

## 2020-11-07 MED ORDER — ALPRAZOLAM 1 MG PO TABS
1.0000 mg | ORAL_TABLET | Freq: Three times a day (TID) | ORAL | 0 refills | Status: DC | PRN
  Filled 2020-11-07: qty 90, 30d supply, fill #0

## 2020-11-07 MED ORDER — HEPARIN SOD (PORK) LOCK FLUSH 100 UNIT/ML IV SOLN
500.0000 [IU] | Freq: Once | INTRAVENOUS | Status: AC
  Administered 2020-11-07: 500 [IU] via INTRAVENOUS
  Filled 2020-11-07: qty 5

## 2020-11-07 MED ORDER — SODIUM CHLORIDE 0.9% FLUSH
10.0000 mL | Freq: Once | INTRAVENOUS | Status: AC
  Administered 2020-11-07: 10 mL via INTRAVENOUS
  Filled 2020-11-07: qty 10

## 2020-11-07 NOTE — Progress Notes (Signed)
   Covid-19 Vaccination [facility]  Name:  Isaac [name]    MRN: [id number] DOB: [birth year]  11/07/2020  [name] was observed post Covid-19 immunization for 15 minutes without incident. He was provided with Vaccine Information Sheet and instruction to access the V-Safe system.   [name] was instructed to call 911 with any severe reactions post vaccine: Difficulty breathing  Swelling of face and throat  A fast heartbeat  A bad rash all over body  Dizziness and weakness   Immunizations Administered     Name Date Dose VIS Date Route   PFIZER Comrnaty(Gray TOP) Covid-19 Vaccine 11/07/2020  1:08 PM 0.3 mL 04/19/2020 Intramuscular   Manufacturer: [place]   Lot: [id number]   [place]: [phone]

## 2020-11-07 NOTE — Patient Instructions (Signed)

## 2020-11-07 NOTE — Progress Notes (Signed)
Hematology and Oncology Follow Up Visit  Isaac [name] [id number] [birth year] 65 y.o. 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 is here today for follow-up.  So far, he is doing pretty well.  He is complaining of some headaches.  These do not wake him up.  They have all been going on for a couple weeks.  He said that he takes some Tylenol for this.  His back been hurting him.  Not sure as to why his back would be hurting him.  For right now, we will see about getting a PET scan on him.  He certainly is at risk for recurrence.  His blood sugars seem to be doing better.  He did have triple hernia surgery.  He had to inguinal hernias over on the left side.  Had one on the right side.  He had surgery for this on April 1.  He got through this.  He has had no cough or shortness of breath.  He has had no issues with bleeding.  He has had no problems with COVID.  He has had little bit of swelling in the legs.  Overall, his performance status is ECOG 1.    Medications:  Allergies as of 11/07/2020       Reactions   Hydrocodone Itching, Nausea And Vomiting   Nsaids Other (See Comments)   Has 1 kidney left   Dilaudid [hydromorphone] Nausea And Vomiting        Medication List        Accurate as of November 07, 2020 12:36 PM. If you have any questions, ask your nurse or doctor.          acetaminophen 500 MG tablet Commonly known as: TYLENOL Take 500 mg by mouth every 6 (six) hours as needed for moderate pain or headache.   ALPRAZolam 1 MG tablet Commonly known as: XANAX Take 1 tablet (1 mg total) by mouth 3 (three) times daily as needed for anxiety.   amLODipine 5 MG tablet Commonly known as:  NORVASC Take 0.5 tablets (2.5 mg total) by mouth daily.   aspirin EC 81 MG tablet Take 81 mg by mouth at bedtime.   ciclopirox 8 % solution Commonly known as: PENLAC Apply 1 application topically at bedtime as needed (toenail fungus). APPLY ON NAIL AND SURROUNDING SKIN OVER PREVIOUS COAT. AFTER 7 DAYS REMOVE WITH ALCOHOL AND REPEAT CYCLE.   ciprofloxacin 250 MG tablet Commonly known as: CIPRO TAKE 1 TABLET BY MOUTH TWICE DAILY   ciprofloxacin 250 MG tablet Commonly known as: CIPRO TAKE 1 TABLET BY MOUTH TWICE DAILY   diazepam 10 MG tablet Commonly known as: VALIUM 10 mg. Insert 10 mg rectally at night as needed bowel relaxation   finasteride 5 MG tablet Commonly known as: PROSCAR Take 5 mg by mouth daily.   finasteride 5 MG tablet Commonly known as: PROSCAR TAKE 1 TABLET BY MOUTH ONCE DAILY   fluconazole 200 MG tablet Commonly known as: DIFLUCAN TAKE 1 TABLET BY MOUTH DAILY   gabapentin 300 MG capsule Commonly known as: NEURONTIN TAKE 1 CAPSULE BY MOUTH TWICE DAILY   glimepiride 2 MG tablet Commonly known as: AMARYL TAKE 1 BY MOUTH DAILY BEFORE SUPPER What changed:  how much to take how to take this when to take this additional instructions   insulin glargine (1 Unit Dial) 300 UNIT/ML Solostar Pen Commonly known as: TOUJEO Inject 40 Units into the skin 2 (two) times daily.   lidocaine-prilocaine cream Commonly known as: EMLA Apply topically.   lidocaine-prilocaine cream Commonly known as: EMLA APPLY ON TO THE SKIN AS NEEDED   lidocaine-prilocaine cream Commonly known as: EMLA APPLY ON TO THE SKIN AS NEEDED   metoprolol succinate 25 MG 24 hr tablet Commonly known as: TOPROL-XL TAKE 1 TABLET BY MOUTH AT BEDTIME. GENERIC EQUIVALENT FOR TOPROL-XL   metroNIDAZOLE 500 MG tablet Commonly known as: FLAGYL TAKE 1 TABLET BY MOUTH EVERY 6 HOURS   metroNIDAZOLE 500 MG tablet Commonly known as: FLAGYL TAKE 2 TABLETS AT 2 PM, 3 PM AND 10 PM THE DAY PRIOR TO  COLON OPERATION   neomycin 500 MG tablet Commonly known as: MYCIFRADIN TAKE TWO TABLETS BY MOUTH AT 2PM, 3PM AND 10PM THE DAY PRIOR TO SURGERY   nitroGLYCERIN 0.4 MG SL tablet Commonly known as: NITROSTAT Place 1 tablet (0.4 mg total) under the tongue every 5 (five) minutes as needed for chest pain. Repeat every 5 minute up to 3 doses if no relief call 911.   ondansetron 4 MG tablet Commonly known as: ZOFRAN Take 4 mg by mouth every 8 (eight) hours as needed for nausea/vomiting.   ondansetron 4 MG tablet Commonly known as: ZOFRAN TAKE 1 TABLET BY MOUTH EVERY 8 HOURS AS NEEDED FOR NAUSEA   Ozempic (0.25 or 0.5 MG/DOSE) 2 MG/1.5ML Sopn Generic drug: Semaglutide(0.25 or 0.5MG /DOS) Inject into the skin.   pantoprazole 40 MG tablet Commonly known as: PROTONIX Take 1 tablet (40 mg total) by mouth every morning.   Pfizer-BioNTech COVID-19 Vacc 30 MCG/0.3ML injection Generic drug: COVID-19 mRNA vaccine (Pfizer) INJECT AS DIRECTED   PRESCRIPTION MEDICATION Inhale into the lungs at bedtime. CPAP   rOPINIRole 2 MG tablet Commonly known as: REQUIP TAKE 1 TABLET BY MOUTH AT BEDTIME   rosuvastatin 10 MG tablet Commonly known as: CRESTOR Take 1 tablet (10 mg total) by mouth every other day.   Shingrix injection Generic drug: Zoster Vaccine Adjuvanted INJECT AS DIRECTED   tadalafil 5 MG tablet Commonly known as: Cialis Take 1 tablet (5 mg total) by mouth daily as needed for erectile dysfunction.   tadalafil 5 MG tablet Commonly known as: CIALIS Take one tablet by mouth once daily.   tamsulosin 0.4 MG Caps capsule Commonly known as: FLOMAX Take 0.4 mg by mouth daily.   tamsulosin 0.4 MG Caps capsule Commonly known as: FLOMAX TAKE 2 CAPSULE BY MOUTH ONCE DAILY   traMADol 50 MG tablet Commonly known as: ULTRAM TAKE 1 - 2 TABLETS BY MOUTH EVERY 6 HOURS AS NEEDED FOR PAIN        Allergies:  Allergies  Allergen Reactions   Hydrocodone Itching and Nausea And Vomiting    Nsaids Other (See Comments)    Has 1 kidney left   Dilaudid [Hydromorphone] Nausea And Vomiting    Past Medical History, Surgical history, Social history, and Family History were reviewed and updated.  Review of Systems: Review of Systems  Constitutional: Negative.   HENT: Negative.    Eyes: Negative.   Respiratory: Negative.    Cardiovascular: Negative.   Gastrointestinal: Negative.   Genitourinary: Negative.   Musculoskeletal:  Positive for back pain.  Skin: Negative.   Neurological:  Positive for headaches.  Endo/Heme/Allergies: Negative.   Psychiatric/Behavioral: Negative.  Physical Exam:  weight is 222 lb (100.7 kg). His oral temperature is 98.3 F (36.8 C). His blood pressure is 112/70 and his pulse is 77. His respiration is 18 and oxygen saturation is 100%.   Wt Readings from Last 3 Encounters:  11/07/20 222 lb (100.7 kg)  11/07/20 222 lb 6.4 oz (100.9 kg)  08/07/20 220 lb 6.4 oz (100 kg)    Physical Exam Vitals reviewed.  HENT:     Head: Normocephalic and atraumatic.  Eyes:     Pupils: Pupils are equal, round, and reactive to light.  Cardiovascular:     Rate and Rhythm: Normal rate and regular rhythm.     Heart sounds: Normal heart sounds.  Pulmonary:     Effort: Pulmonary effort is normal.     Breath sounds: Normal breath sounds.  Abdominal:     General: Bowel sounds are normal.     Palpations: Abdomen is soft.  Musculoskeletal:        General: No tenderness or deformity. Normal range of motion.     Cervical back: Normal range of motion.  Lymphadenopathy:     Cervical: No cervical adenopathy.  Skin:    General: Skin is warm and dry.     Findings: No erythema or rash.  Neurological:     Mental Status: He is alert and oriented to person, place, and time.  Psychiatric:        Behavior: Behavior normal.        Thought Content: Thought content normal.        Judgment: Judgment normal.    Lab Results  Component Value Date   WBC 3.9 (L)  11/07/2020   HGB 11.8 (L) 11/07/2020   HCT 37.1 (L) 11/07/2020   MCV 88.1 11/07/2020   PLT 130 (L) 11/07/2020   Lab Results  Component Value Date   FERRITIN 52 07/06/2018   IRON 98 07/06/2018   TIBC 371 07/06/2018   UIBC 273 07/06/2018   IRONPCTSAT 27 07/06/2018   Lab Results  Component Value Date   RETICCTPCT 2.2 (H) 10/15/2017   RBC 4.21 (L) 11/07/2020   No results found for: Nils Pyle [facility] Lab Results  Component Value Date   IGA 232 01/25/2015   No results found for: Odetta Pink, SPEI   Chemistry      Component Value Date/Time   NA 136 11/07/2020 1107   NA 134 05/04/2017 0925   K 4.5 11/07/2020 1107   K 4.5 05/04/2017 0925   CL 101 11/07/2020 1107   CL 97 (L) 05/04/2017 0925   CO2 27 11/07/2020 1107   CO2 23 05/04/2017 0925   BUN 28 (H) 11/07/2020 1107   BUN 28 (H) 05/04/2017 0925   CREATININE 1.68 (H) 11/07/2020 1107   CREATININE 1.95 (H) 03/07/2019 1038   GLU 86 03/31/2016 0000      Component Value Date/Time   CALCIUM 9.3 11/07/2020 1107   CALCIUM 9.3 05/04/2017 0925   ALKPHOS 59 11/07/2020 1107   ALKPHOS 58 05/04/2017 0925   AST 25 11/07/2020 1107   ALT 30 11/07/2020 1107   ALT 90 (H) 05/04/2017 0925   BILITOT 0.5 11/07/2020 1107       Impression and Plan: [name] is a very pleasant 65 yo caucasian gentleman with infiltrating high grade papillary urothelial carcinoma with right nephrectomy in October 2018 and adjuvant chemotherapy.   He will have a PET scan done in 2 weeks.  We will set this  up for him.  I want to make sure that there is no recurrent disease.  His diabetes seems to be doing better.  I know this is been a problem for him.  I would like to get him back in 6 weeks.  We will see how everything looks.  If all looks good at that time, the maybe we can get him back after Labor Day.    [name], MD 6/29/202212:36 PM

## 2020-11-08 ENCOUNTER — Telehealth: Payer: Self-pay | Admitting: Internal Medicine

## 2020-11-08 NOTE — Telephone Encounter (Signed)
Pt c/o medication issue: 1. Name of Medication: Neomycin 500 mg 2. How are you currently taking this medication (dosage and times per day)? 2 times a day  3. Are you having a reaction (difficulty breathing--STAT)?  No  4. What is your medication issue? Patient need Assistant from [employer]

## 2020-11-08 NOTE — Telephone Encounter (Signed)
Attempted to call [employer] to notify they have sent request to the wrong office.  [employer] would not assist me without an NPI #.  Will forward message to ordering physician for neomycin.

## 2020-11-09 ENCOUNTER — Other Ambulatory Visit (HOSPITAL_BASED_OUTPATIENT_CLINIC_OR_DEPARTMENT_OTHER): Payer: Self-pay

## 2020-11-09 MED ORDER — RANOLAZINE ER 500 MG PO TB12
500.0000 mg | ORAL_TABLET | Freq: Two times a day (BID) | ORAL | 1 refills | Status: DC
  Filled 2020-11-09: qty 60, 30d supply, fill #0
  Filled 2020-12-17: qty 60, 30d supply, fill #1

## 2020-11-09 MED ORDER — COVID-19 MRNA VAC-TRIS(PFIZER) 30 MCG/0.3ML IM SUSP
INTRAMUSCULAR | 0 refills | Status: DC
  Filled 2020-11-09: qty 0.3, 1d supply, fill #0

## 2020-11-09 MED ORDER — RANOLAZINE ER 500 MG PO TB12
500.0000 mg | ORAL_TABLET | Freq: Two times a day (BID) | ORAL | 2 refills | Status: DC
  Filled 2020-11-09: qty 60, 30d supply, fill #0

## 2020-11-09 MED ORDER — RANOLAZINE ER 500 MG PO TB12: 500.0000 mg | ORAL_TABLET | Freq: Two times a day (BID) | ORAL | 11 refills | Status: DC

## 2020-11-09 NOTE — Telephone Encounter (Signed)
Spoke with pt and is willing to try and  pay for Ranexa  Pt okayed script  to be sent  to local pharmacy for 30 days and another script sent to  mail order Per Joe at mail order pt will be called once filled prior to sending to pt to give pt option if willing to pay the cost of med after insurance .Adonis Housekeeper

## 2020-11-13 ENCOUNTER — Telehealth: Payer: Self-pay | Admitting: Internal Medicine

## 2020-11-13 ENCOUNTER — Other Ambulatory Visit (HOSPITAL_BASED_OUTPATIENT_CLINIC_OR_DEPARTMENT_OTHER): Payer: Self-pay

## 2020-11-13 NOTE — Telephone Encounter (Signed)
[employer] need the clinical for the prior authorization that's been submitted. Please advise

## 2020-11-14 ENCOUNTER — Telehealth: Payer: Self-pay | Admitting: Internal Medicine

## 2020-11-14 NOTE — Telephone Encounter (Signed)
Pt c/o medication issue:  1. Name of Medication: ranolazine (RANEXA) 500 MG 12 hr tablet  2. How are you currently taking this medication (dosage and times per day)? As prescribed   3. Are you having a reaction (difficulty breathing--STAT)? No   4. What is your medication issue? Isaac [name] is calling stating Dr. Harrington Challenger requested he call the office when he starts this medication to setup an appt to have an EKG performed. He started the medication today. There is no availability with Dr. Harrington Challenger or the PA's at this time. Please advise.

## 2020-11-14 NOTE — Telephone Encounter (Signed)
Pt started Ranexa today.   Pt was having increased chest pains off amlodipine so will check EKG on 11/23/20.  Replied to patient via MyChart message.

## 2020-11-15 NOTE — Telephone Encounter (Signed)
**Note De-Identified  Obfuscation** Cardiology did not prescribe Neomycin (?) so a PA will not be done for this medication by [place].

## 2020-11-20 ENCOUNTER — Telehealth: Payer: Self-pay | Admitting: Internal Medicine

## 2020-11-20 NOTE — Telephone Encounter (Signed)
Pt c/o medication issue:  1. Name of Medication: ranolazine (RANEXA) 500 MG 12 hr tablet  2. How are you currently taking this medication (dosage and times per day)? N/A  3. Are you having a reaction (difficulty breathing--STAT)? N/A  4. What is your medication issue? Saj is calling with [employer]'s final attempt to reach our office in regards to this. He states the can only hold this to end of day for PA on this medication. He can receive the needed information verbally or it can be performed by cover my meds online. Please advise.

## 2020-11-21 ENCOUNTER — Other Ambulatory Visit (HOSPITAL_BASED_OUTPATIENT_CLINIC_OR_DEPARTMENT_OTHER): Payer: Self-pay

## 2020-11-21 DIAGNOSIS — G4733 Obstructive sleep apnea (adult) (pediatric): Secondary | ICD-10-CM | POA: Diagnosis not present

## 2020-11-21 NOTE — Telephone Encounter (Deleted)
**Note De-Identified  Obfuscation** I attempted to do this Ranolazine PA through covermymeds but cannot as no eligibility can be found on the pt.  I called [employer] and s/w Reynaldo Minium who advised me that this PA case was closed yesterday and will now need an appeal because we did not res

## 2020-11-21 NOTE — Telephone Encounter (Signed)
**Note De-Identified  Obfuscation** I attempted to do this Ranolazine PA through covermymeds but cannot as no eligibility can be found on the pt.   I called [employer] and s/w Reynaldo Minium who advised me that this PA case was closed yesterday and will now need an appeal because we did not respond in time. Per Reynaldo Minium [employer] called [facility] on 6/28 and s/w Phil who advised them that no one was available to take their call. He also states that they have faxed a request to [place] as well but I have not receive that either.   He 1st stated we could do an urgent appeal over the phone but later stated we could not and that I would  need to provide notes supporting the pts need for Ranexa.   I advised Marinus Maw several times that we did not request Name Brand Ranexa and sent the pts RX in to fill as generic Ranolazine.   Since it was difficult for me to understand Marinus Maw due to his accent I asked to s/w a supervisor but he stated none was available and could not tell me if generic Ranolazine is covered under the pts plan or not.   Before writing the appeal letter and sending pts notes to [place] called [place] to verify that a Ranolazine PA is needed and was advised that the pt picked up his generic Ranolazine on 6/28 and that a PA was not required.

## 2020-11-23 ENCOUNTER — Ambulatory Visit (INDEPENDENT_AMBULATORY_CARE_PROVIDER_SITE_OTHER): Payer: Medicare Other | Admitting: *Deleted

## 2020-11-23 ENCOUNTER — Other Ambulatory Visit: Payer: Self-pay

## 2020-11-23 VITALS — BP 130/70 | HR 68 | Wt 222.6 lb

## 2020-11-23 DIAGNOSIS — I2510 Atherosclerotic heart disease of native coronary artery without angina pectoris: Secondary | ICD-10-CM | POA: Diagnosis not present

## 2020-11-23 NOTE — Progress Notes (Signed)
Reason for visit: EKG  Name of MD requesting visit: Dr. Harrington Challenger  H&P: changed from isosorbide to amlodipine - pt reported breakthrough angina, changed to Ranexa 500 mg BID on 11/09/20  ROS related to problem: here today for EKG, reports continues to have more frequent episodes of chest pressure w SOB with activities.  Also feels he has a lot of fatigue.  This is not new but seems worse since hernia surgergy in April.  CP episodes resolve without ntg, and are not consistently w a particular activity. He asks about mild leg swelling that is not present upon waking but gets worse as day goes on.  We discussed elevating legs, increasing activity and monitoring/decreasing sodium. He asked about a fluid pill.  He has one kidney.  Assessment and plan per MD: pt would like to know from Dr. Harrington Challenger if any testing should be done for the increased episodes of chest pain vs increasing dose of Ranexa.  Pt aware I will give him a call back with any recommendations.

## 2020-11-27 NOTE — Progress Notes (Signed)
Spoke to pt re EKG   Normal He is still feeling fatigued    No CP   I would recomm he have a stress myoview to r/o ischemia    Schedule at [address] [facility]

## 2020-11-30 ENCOUNTER — Telehealth: Payer: Self-pay | Admitting: *Deleted

## 2020-11-30 ENCOUNTER — Encounter: Payer: Self-pay | Admitting: *Deleted

## 2020-11-30 DIAGNOSIS — G620 Drug-induced polyneuropathy: Secondary | ICD-10-CM | POA: Diagnosis not present

## 2020-11-30 DIAGNOSIS — B351 Tinea unguium: Secondary | ICD-10-CM | POA: Diagnosis not present

## 2020-11-30 DIAGNOSIS — E1142 Type 2 diabetes mellitus with diabetic polyneuropathy: Secondary | ICD-10-CM | POA: Diagnosis not present

## 2020-11-30 DIAGNOSIS — T451X5A Adverse effect of antineoplastic and immunosuppressive drugs, initial encounter: Secondary | ICD-10-CM | POA: Diagnosis not present

## 2020-11-30 DIAGNOSIS — I2510 Atherosclerotic heart disease of native coronary artery without angina pectoris: Secondary | ICD-10-CM

## 2020-11-30 DIAGNOSIS — R5383 Other fatigue: Secondary | ICD-10-CM

## 2020-11-30 NOTE — Telephone Encounter (Addendum)
  Author: [name], MD Service: Cardiology Author Type: Physician  Filed: 11/27/2020 12:06 PM Encounter Date: 11/23/2020 Status: Signed  Editor: [name], MD (Physician)          Spoke to pt re EKG   Normal He is still feeling fatigued    No CP   I would recomm he have a stress myoview to r/o ischemia    Schedule at [address] [facility]

## 2020-11-30 NOTE — Telephone Encounter (Signed)
Called pt to let him know exercise nuclear stress test has been ordered, instructions have been sent to him through [place].  Left VM of this and to check MyChart for instructions, and to call back if any questions.

## 2020-12-04 ENCOUNTER — Telehealth (HOSPITAL_COMMUNITY): Payer: Self-pay | Admitting: *Deleted

## 2020-12-04 NOTE — Telephone Encounter (Signed)
Patient given detailed instructions per Myocardial Perfusion Study Information Sheet for the test on 12/10/20 at 10:45. Patient notified to arrive 15 minutes early and that it is imperative to arrive on time for appointment to keep from having the test rescheduled.  If you need to cancel or reschedule your appointment, please call the office within 24 hours of your appointment. . Patient verbalized understanding.Isaac [name]

## 2020-12-06 ENCOUNTER — Other Ambulatory Visit: Payer: Self-pay

## 2020-12-06 ENCOUNTER — Ambulatory Visit (HOSPITAL_COMMUNITY)
Admission: RE | Admit: 2020-12-06 | Discharge: 2020-12-06 | Disposition: A | Discharge status: Home / Self Care | Payer: Medicare Other | Source: Ambulatory Visit | Attending: Hematology & Oncology | Admitting: Hematology & Oncology

## 2020-12-06 DIAGNOSIS — C651 Malignant neoplasm of right renal pelvis: Secondary | ICD-10-CM | POA: Diagnosis not present

## 2020-12-06 DIAGNOSIS — C679 Malignant neoplasm of bladder, unspecified: Secondary | ICD-10-CM | POA: Diagnosis not present

## 2020-12-06 DIAGNOSIS — Z905 Acquired absence of kidney: Secondary | ICD-10-CM | POA: Insufficient documentation

## 2020-12-06 DIAGNOSIS — C659 Malignant neoplasm of unspecified renal pelvis: Secondary | ICD-10-CM | POA: Diagnosis not present

## 2020-12-06 LAB — GLUCOSE, CAPILLARY: Glucose-Capillary: 183 mg/dL — ABNORMAL HIGH (ref 70–99)

## 2020-12-06 MED ORDER — FLUDEOXYGLUCOSE F - 18 (FDG) INJECTION
11.0600 | Freq: Once | INTRAVENOUS | Status: AC | PRN
  Administered 2020-12-06: 11.06 via INTRAVENOUS

## 2020-12-10 ENCOUNTER — Other Ambulatory Visit: Payer: Self-pay

## 2020-12-10 ENCOUNTER — Ambulatory Visit (HOSPITAL_COMMUNITY): Payer: Medicare Other | Attending: Internal Medicine

## 2020-12-10 ENCOUNTER — Telehealth: Payer: Self-pay | Admitting: *Deleted

## 2020-12-10 DIAGNOSIS — I2510 Atherosclerotic heart disease of native coronary artery without angina pectoris: Secondary | ICD-10-CM | POA: Insufficient documentation

## 2020-12-10 DIAGNOSIS — R5383 Other fatigue: Secondary | ICD-10-CM

## 2020-12-10 LAB — MYOCARDIAL PERFUSION IMAGING
Estimated workload: 7 METS
Exercise duration (min): 5 min
Exercise duration (sec): 15 s
LV dias vol: 83 mL (ref 62–150)
LV sys vol: 39 mL
MPHR: 155 {beats}/min
Peak HR: 141 {beats}/min
Percent HR: 90 %
RPE: 19
Rest HR: 65 {beats}/min
SDS: 1
SRS: 0
SSS: 1
TID: 1.04

## 2020-12-10 MED ORDER — TECHNETIUM TC 99M TETROFOSMIN IV KIT
29.3000 | PACK | Freq: Once | INTRAVENOUS | Status: AC | PRN
  Administered 2020-12-10: 29.3 via INTRAVENOUS
  Filled 2020-12-10: qty 30

## 2020-12-10 MED ORDER — TECHNETIUM TC 99M TETROFOSMIN IV KIT
10.5000 | PACK | Freq: Once | INTRAVENOUS | Status: AC | PRN
  Administered 2020-12-10: 10.5 via INTRAVENOUS
  Filled 2020-12-10: qty 11

## 2020-12-10 NOTE — Telephone Encounter (Signed)
-----   Message from [name], MD sent at 12/07/2020  3:07 PM EDT ----- Call - there is NO cancer!!!  Isaac [name]

## 2020-12-10 NOTE — Telephone Encounter (Signed)
As noted blow by Dr. [name], I informed the patient that there is NO cancer! He verbalized understanding.

## 2020-12-17 ENCOUNTER — Other Ambulatory Visit (HOSPITAL_BASED_OUTPATIENT_CLINIC_OR_DEPARTMENT_OTHER): Payer: Self-pay

## 2020-12-17 ENCOUNTER — Encounter: Payer: Self-pay | Admitting: Family

## 2020-12-17 MED FILL — Finasteride Tab 5 MG: ORAL | 90 days supply | Qty: 90 | Fill #1 | Status: AC

## 2020-12-17 MED FILL — Tamsulosin HCl Cap 0.4 MG: ORAL | 30 days supply | Qty: 60 | Fill #2 | Status: AC

## 2020-12-19 ENCOUNTER — Inpatient Hospital Stay: Payer: Medicare Other | Attending: Hematology & Oncology

## 2020-12-19 ENCOUNTER — Encounter: Payer: Self-pay | Admitting: Hematology & Oncology

## 2020-12-19 ENCOUNTER — Inpatient Hospital Stay: Payer: Medicare Other | Admitting: Hematology & Oncology

## 2020-12-19 ENCOUNTER — Inpatient Hospital Stay: Payer: Medicare Other

## 2020-12-19 ENCOUNTER — Other Ambulatory Visit (HOSPITAL_BASED_OUTPATIENT_CLINIC_OR_DEPARTMENT_OTHER): Payer: Self-pay

## 2020-12-19 ENCOUNTER — Other Ambulatory Visit: Payer: Self-pay | Admitting: Internal Medicine

## 2020-12-19 ENCOUNTER — Other Ambulatory Visit: Payer: Self-pay

## 2020-12-19 VITALS — BP 138/77 | HR 70 | Temp 98.0°F | Resp 17 | Ht 69.0 in | Wt 226.0 lb

## 2020-12-19 DIAGNOSIS — C651 Malignant neoplasm of right renal pelvis: Secondary | ICD-10-CM

## 2020-12-19 DIAGNOSIS — Z79899 Other long term (current) drug therapy: Secondary | ICD-10-CM | POA: Diagnosis not present

## 2020-12-19 DIAGNOSIS — Z7982 Long term (current) use of aspirin: Secondary | ICD-10-CM | POA: Insufficient documentation

## 2020-12-19 DIAGNOSIS — E1101 Type 2 diabetes mellitus with hyperosmolarity with coma: Secondary | ICD-10-CM | POA: Diagnosis not present

## 2020-12-19 DIAGNOSIS — Z8551 Personal history of malignant neoplasm of bladder: Secondary | ICD-10-CM | POA: Insufficient documentation

## 2020-12-19 DIAGNOSIS — Z452 Encounter for adjustment and management of vascular access device: Secondary | ICD-10-CM | POA: Insufficient documentation

## 2020-12-19 DIAGNOSIS — R5383 Other fatigue: Secondary | ICD-10-CM | POA: Insufficient documentation

## 2020-12-19 DIAGNOSIS — Z95828 Presence of other vascular implants and grafts: Secondary | ICD-10-CM

## 2020-12-19 LAB — CBC WITH DIFFERENTIAL (CANCER CENTER ONLY)
Abs Immature Granulocytes: 0.01 10*3/uL (ref 0.00–0.07)
Basophils Absolute: 0 10*3/uL (ref 0.0–0.1)
Basophils Relative: 1 %
Eosinophils Absolute: 0.1 10*3/uL (ref 0.0–0.5)
Eosinophils Relative: 3 %
HCT: 35.7 % — ABNORMAL LOW (ref 39.0–52.0)
Hemoglobin: 11.5 g/dL — ABNORMAL LOW (ref 13.0–17.0)
Immature Granulocytes: 0 %
Lymphocytes Relative: 36 %
Lymphs Abs: 1.1 10*3/uL (ref 0.7–4.0)
MCH: 28.3 pg (ref 26.0–34.0)
MCHC: 32.2 g/dL (ref 30.0–36.0)
MCV: 87.9 fL (ref 80.0–100.0)
Monocytes Absolute: 0.3 10*3/uL (ref 0.1–1.0)
Monocytes Relative: 10 %
Neutro Abs: 1.6 10*3/uL — ABNORMAL LOW (ref 1.7–7.7)
Neutrophils Relative %: 50 %
Platelet Count: 107 10*3/uL — ABNORMAL LOW (ref 150–400)
RBC: 4.06 MIL/uL — ABNORMAL LOW (ref 4.22–5.81)
RDW: 16.6 % — ABNORMAL HIGH (ref 11.5–15.5)
WBC Count: 3.2 10*3/uL — ABNORMAL LOW (ref 4.0–10.5)
nRBC: 0 % (ref 0.0–0.2)

## 2020-12-19 LAB — CMP (CANCER CENTER ONLY)
ALT: 28 U/L (ref 0–44)
AST: 20 U/L (ref 15–41)
Albumin: 3.7 g/dL (ref 3.5–5.0)
Alkaline Phosphatase: 54 U/L (ref 38–126)
Anion gap: 10 (ref 5–15)
BUN: 22 mg/dL (ref 8–23)
CO2: 23 mmol/L (ref 22–32)
Calcium: 8.5 mg/dL — ABNORMAL LOW (ref 8.9–10.3)
Chloride: 103 mmol/L (ref 98–111)
Creatinine: 2.31 mg/dL — ABNORMAL HIGH (ref 0.61–1.24)
GFR, Estimated: 31 mL/min — ABNORMAL LOW (ref >60)
Glucose, Bld: 291 mg/dL — ABNORMAL HIGH (ref 70–99)
Potassium: 4.4 mmol/L (ref 3.5–5.1)
Sodium: 136 mmol/L (ref 135–145)
Total Bilirubin: 0.4 mg/dL (ref 0.3–1.2)
Total Protein: 5.8 g/dL — ABNORMAL LOW (ref 6.5–8.1)

## 2020-12-19 LAB — LACTATE DEHYDROGENASE: LDH: 145 U/L (ref 98–192)

## 2020-12-19 MED ORDER — HEPARIN SOD (PORK) LOCK FLUSH 100 UNIT/ML IV SOLN
500.0000 [IU] | Freq: Once | INTRAVENOUS | Status: AC
  Administered 2020-12-19: 500 [IU] via INTRAVENOUS
  Filled 2020-12-19: qty 5

## 2020-12-19 MED ORDER — RANOLAZINE ER 500 MG PO TB12
500.0000 mg | ORAL_TABLET | Freq: Two times a day (BID) | ORAL | 2 refills | Status: DC
  Filled 2020-12-19: qty 60, 30d supply, fill #0

## 2020-12-19 MED ORDER — SODIUM CHLORIDE 0.9% FLUSH
10.0000 mL | Freq: Once | INTRAVENOUS | Status: AC
  Administered 2020-12-19: 10 mL via INTRAVENOUS
  Filled 2020-12-19: qty 10

## 2020-12-19 NOTE — Progress Notes (Signed)
Hematology and Oncology Follow Up Visit  PHENG PROKOP [id number] [birth year] 65 y.o. 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 is here today for follow-up.  I am not sure as to what might be going on.  I have noted that his hemoglobin and CBC keep trending downward.  This is been going on for about a year.  He does feel tired.  I am sending off a TSH on him.  I know that his blood sugars are not all that well controlled.  I am unsure if this might be a factor with respect to his decreasing CBC.  We did do a PET scan on him.  PET scan was done about a week ago.  The PET scan not show any evidence of recurrent urothelial carcinoma.  Again, more worried about his blood counts trending downward right now.  I just wonder if there may not be an issue with respect to myelodysplasia.    His back still bothers him.  I have to believe that this is probably can be related to arthritis and probably his weight gain.  His blood sugars are on the high side.  I am not sure how well these are controlled.  He has had no fever.  There is been no issues with COVID.  He has had no rashes.  There is been no leg swelling.     Overall, his performance status is ECOG 1.    Medications:  Allergies as of 12/19/2020       Reactions   Hydrocodone Itching, Nausea And Vomiting   Nsaids Other (See Comments)   Has 1 kidney left   Dilaudid [hydromorphone] Nausea And Vomiting        Medication List        Accurate as of December 19, 2020  3:52 PM. If you have any questions, ask your nurse or doctor.          STOP taking these medications    ciclopirox 8 % solution Commonly known as: PENLAC Stopped by: [name], MD   ciprofloxacin 250 MG tablet Commonly known as: CIPRO Stopped by: [name], MD   fluconazole 200 MG tablet Commonly known as: DIFLUCAN Stopped by: [name], MD   metroNIDAZOLE 500 MG tablet Commonly known as: FLAGYL Stopped by: [name], MD   neomycin 500 MG tablet Commonly known as: MYCIFRADIN Stopped by: [name], MD   Pfizer-BioNT COVID-19 Vac-TriS Susp injection Generic drug: COVID-19 mRNA Vac-TriS Therapist, music) Stopped by: [name], MD   Pfizer-BioNTech COVID-19 Vacc 30 MCG/0.3ML injection Generic drug: COVID-19 mRNA vaccine (Pfizer) Stopped by: [name], MD   Shingrix injection Generic drug: Zoster Vaccine Adjuvanted Stopped by: [name], MD   traMADol 50 MG tablet Commonly known as: ULTRAM Stopped by: [name], MD       TAKE these medications    acetaminophen 500 MG tablet Commonly known as: TYLENOL Take 500 mg by mouth every 6 (six) hours as needed for moderate pain or headache.   ALPRAZolam 1 MG tablet Commonly known as: XANAX Take 1 tablet (1 mg total) by mouth 3 (three) times daily as needed for anxiety.   aspirin  EC 81 MG tablet Take 81 mg by mouth at bedtime.   finasteride 5 MG tablet Commonly known as: PROSCAR TAKE 1 TABLET BY MOUTH ONCE DAILY What changed: Another medication with the same name was removed. Continue taking this medication, and follow the directions you see here. Changed by: [name], MD   gabapentin 300 MG capsule Commonly known as: NEURONTIN TAKE 1 CAPSULE BY MOUTH TWICE DAILY   glimepiride 2 MG tablet Commonly known as: AMARYL TAKE 1 BY MOUTH DAILY BEFORE SUPPER What changed:  how much to take how to take this when to take this additional instructions   insulin glargine (1 Unit Dial) 300 UNIT/ML Solostar Pen Commonly known as: TOUJEO Inject 40 Units into the skin 2 (two) times daily.   lidocaine-prilocaine cream Commonly known as:  EMLA APPLY ON TO THE SKIN AS NEEDED What changed: Another medication with the same name was removed. Continue taking this medication, and follow the directions you see here. Changed by: [name], MD   metoprolol succinate 25 MG 24 hr tablet Commonly known as: TOPROL-XL TAKE 1 TABLET BY MOUTH AT BEDTIME. GENERIC EQUIVALENT FOR TOPROL-XL   nitroGLYCERIN 0.4 MG SL tablet Commonly known as: NITROSTAT Place 1 tablet (0.4 mg total) under the tongue every 5 (five) minutes as needed for chest pain. Repeat every 5 minute up to 3 doses if no relief call 911.   ondansetron 4 MG tablet Commonly known as: ZOFRAN Take 4 mg by mouth every 8 (eight) hours as needed for nausea/vomiting. What changed: Another medication with the same name was removed. Continue taking this medication, and follow the directions you see here. Changed by: [name], MD   Ozempic (0.25 or 0.5 MG/DOSE) 2 MG/1.5ML Sopn Generic drug: Semaglutide(0.25 or 0.5MG/DOS) Inject into the skin.   pantoprazole 40 MG tablet Commonly known as: PROTONIX Take 1 tablet (40 mg total) by mouth every morning.   PRESCRIPTION MEDICATION Inhale into the lungs at bedtime. CPAP   ranolazine 500 MG 12 hr tablet Commonly known as: Ranexa Take 1 tablet (500 mg total) by mouth 2 (two) times daily. What changed: Another medication with the same name was removed. Continue taking this medication, and follow the directions you see here. Changed by: [name], MD   rOPINIRole 2 MG tablet Commonly known as: REQUIP TAKE 1 TABLET BY MOUTH AT BEDTIME   rosuvastatin 10 MG tablet Commonly known as: CRESTOR Take 1 tablet (10 mg total) by mouth every other day.   tadalafil 5 MG tablet Commonly known as: Cialis Take 1 tablet (5 mg total) by mouth daily as needed for erectile dysfunction.   tamsulosin 0.4 MG Caps capsule Commonly known as: FLOMAX TAKE 2 CAPSULE BY MOUTH ONCE DAILY What changed: Another medication with the same  name was removed. Continue taking this medication, and follow the directions you see here. Changed by: [name], MD        Allergies:  Allergies  Allergen Reactions   Hydrocodone Itching and Nausea And Vomiting   Nsaids Other (See Comments)    Has 1 kidney left   Dilaudid [Hydromorphone] Nausea And Vomiting    Past Medical History, Surgical history, Social history, and Family History were reviewed and updated.  Review of Systems: Review of Systems  Constitutional: Negative.   HENT: Negative.    Eyes: Negative.   Respiratory: Negative.    Cardiovascular: Negative.   Gastrointestinal: Negative.   Genitourinary: Negative.   Musculoskeletal:  Positive for back  pain.  Skin: Negative.   Neurological:  Positive for headaches.  Endo/Heme/Allergies: Negative.   Psychiatric/Behavioral: Negative.      Physical Exam:  height is 5' 9" (1.753 m) and weight is 226 lb (102.5 kg). His oral temperature is 98 F (36.7 C). His blood pressure is 138/77 and his pulse is 70. His respiration is 17 and oxygen saturation is 100%.   Wt Readings from Last 3 Encounters:  12/19/20 226 lb (102.5 kg)  12/10/20 222 lb (100.7 kg)  11/23/20 222 lb 9.6 oz (101 kg)    Physical Exam Vitals reviewed.  HENT:     Head: Normocephalic and atraumatic.  Eyes:     Pupils: Pupils are equal, round, and reactive to light.  Cardiovascular:     Rate and Rhythm: Normal rate and regular rhythm.     Heart sounds: Normal heart sounds.  Pulmonary:     Effort: Pulmonary effort is normal.     Breath sounds: Normal breath sounds.  Abdominal:     General: Bowel sounds are normal.     Palpations: Abdomen is soft.  Musculoskeletal:        General: No tenderness or deformity. Normal range of motion.     Cervical back: Normal range of motion.  Lymphadenopathy:     Cervical: No cervical adenopathy.  Skin:    General: Skin is warm and dry.     Findings: No erythema or rash.  Neurological:     Mental Status:  He is alert and oriented to person, place, and time.  Psychiatric:        Behavior: Behavior normal.        Thought Content: Thought content normal.        Judgment: Judgment normal.    Lab Results  Component Value Date   WBC 3.2 (L) 12/19/2020   HGB 11.5 (L) 12/19/2020   HCT 35.7 (L) 12/19/2020   MCV 87.9 12/19/2020   PLT 107 (L) 12/19/2020   Lab Results  Component Value Date   FERRITIN 52 07/06/2018   IRON 98 07/06/2018   TIBC 371 07/06/2018   UIBC 273 07/06/2018   IRONPCTSAT 27 07/06/2018   Lab Results  Component Value Date   RETICCTPCT 2.2 (H) 10/15/2017   RBC 4.06 (L) 12/19/2020   No results found for: Nils Pyle, [facility] Lab Results  Component Value Date   IGA 232 01/25/2015   No results found for: Odetta Pink, [facility]   Chemistry      Component Value Date/Time   NA 136 12/19/2020 1409   NA 134 05/04/2017 0925   K 4.4 12/19/2020 1409   K 4.5 05/04/2017 0925   CL 103 12/19/2020 1409   CL 97 (L) 05/04/2017 0925   CO2 23 12/19/2020 1409   CO2 23 05/04/2017 0925   BUN 22 12/19/2020 1409   BUN 28 (H) 05/04/2017 0925   CREATININE 2.31 (H) 12/19/2020 1409   CREATININE 1.95 (H) 03/07/2019 1038   GLU 86 03/31/2016 0000      Component Value Date/Time   CALCIUM 8.5 (L) 12/19/2020 1409   CALCIUM 9.3 05/04/2017 0925   ALKPHOS 54 12/19/2020 1409   ALKPHOS 58 05/04/2017 0925   AST 20 12/19/2020 1409   ALT 28 12/19/2020 1409   ALT 90 (H) 05/04/2017 0925   BILITOT 0.4 12/19/2020 1409       Impression and Plan: [name] is a very pleasant 65 yo caucasian gentleman with infiltrating high grade papillary  urothelial carcinoma with right nephrectomy in October 2018 and adjuvant chemotherapy.   Again, the problem that we have right now is his blood counts.  They seem to be trending downward.  I would like to get him back in 2 months.  We will then see how his blood counts look.  Ultimately, we  may have to get a bone marrow biopsy on him.  I would think would be a little bit soon for him to have any kind of myelodysplastic changes from past chemotherapy.  We will see what his TSH is.   [name], MD 8/10/20223:52 PM

## 2020-12-19 NOTE — Addendum Note (Signed)
Addended by: [name] on: 12/19/2020 04:58 PM   Modules accepted: Orders

## 2020-12-19 NOTE — Patient Instructions (Signed)
Implanted Port Home Guide An implanted port is a device that is placed under the skin. It is usually placed in the chest. The device can be used to give IV medicine, to take blood, or for dialysis. You may have an implanted port if: You need IV medicine that would be irritating to the small veins in your hands or arms. You need IV medicines, such as antibiotics, for a long period of time. You need IV nutrition for a long period of time. You need dialysis. When you have a port, your health care provider can choose to use the port instead of veins in your arms for these procedures. You may have fewer limitations when using a port than you would if you used other types of long-term IVs, and you will likely be able to return to normal activities afteryour incision heals. An implanted port has two main parts: Reservoir. The reservoir is the part where a needle is inserted to give medicines or draw blood. The reservoir is round. After it is placed, it appears as a small, raised area under your skin. Catheter. The catheter is a thin, flexible tube that connects the reservoir to a vein. Medicine that is inserted into the reservoir goes into the catheter and then into the vein. How is my port accessed? To access your port: A numbing cream may be placed on the skin over the port site. Your health care provider will put on a mask and sterile gloves. The skin over your port will be cleaned carefully with a germ-killing soap and allowed to dry. Your health care provider will gently pinch the port and insert a needle into it. Your health care provider will check for a blood return to make sure the port is in the vein and is not clogged. If your port needs to remain accessed to get medicine continuously (constant infusion), your health care provider will place a clear bandage (dressing) over the needle site. The dressing and needle will need to be changed every week, or as told by your health care provider. What  is flushing? Flushing helps keep the port from getting clogged. Follow instructions from your health care provider about how and when to flush the port. Ports are usually flushed with saline solution or a medicine called heparin. The need for flushing will depend on how the port is used: If the port is only used from time to time to give medicines or draw blood, the port may need to be flushed: Before and after medicines have been given. Before and after blood has been drawn. As part of routine maintenance. Flushing may be recommended every 4-6 weeks. If a constant infusion is running, the port may not need to be flushed. Throw away any syringes in a disposal container that is meant for sharp items (sharps container). You can buy a sharps container from a pharmacy, or you can make one by using an empty hard plastic bottle with a cover. How long will my port stay implanted? The port can stay in for as long as your health care provider thinks it is needed. When it is time for the port to come out, a surgery will be done to remove it. The surgery will be similar to the procedure that was done to putthe port in. Follow these instructions at home:  Flush your port as told by your health care provider. If you need an infusion over several days, follow instructions from your health care provider about how to take   care of your port site. Make sure you: Wash your hands with soap and water before you change your dressing. If soap and water are not available, use alcohol-based hand sanitizer. Change your dressing as told by your health care provider. Place any used dressings or infusion bags into a plastic bag. Throw that bag in the trash. Keep the dressing that covers the needle clean and dry. Do not get it wet. Do not use scissors or sharp objects near the tube. Keep the tube clamped, unless it is being used. Check your port site every day for signs of infection. Check for: Redness, swelling, or  pain. Fluid or blood. Pus or a bad smell. Protect the skin around the port site. Avoid wearing bra straps that rub or irritate the site. Protect the skin around your port from seat belts. Place a soft pad over your chest if needed. Bathe or shower as told by your health care provider. The site may get wet as long as you are not actively receiving an infusion. Return to your normal activities as told by your health care provider. Ask your health care provider what activities are safe for you. Carry a medical alert card or wear a medical alert bracelet at all times. This will let health care providers know that you have an implanted port in case of an emergency. Get help right away if: You have redness, swelling, or pain at the port site. You have fluid or blood coming from your port site. You have pus or a bad smell coming from the port site. You have a fever. Summary Implanted ports are usually placed in the chest for long-term IV access. Follow instructions from your health care provider about flushing the port and changing bandages (dressings). Take care of the area around your port by avoiding clothing that puts pressure on the area, and by watching for signs of infection. Protect the skin around your port from seat belts. Place a soft pad over your chest if needed. Get help right away if you have a fever or you have redness, swelling, pain, drainage, or a bad smell at the port site. This information is not intended to replace advice given to you by your health care provider. Make sure you discuss any questions you have with your healthcare provider. Document Revised: 09/12/2019 Document Reviewed: 09/12/2019 Elsevier Patient Education  2022 [employer]  

## 2020-12-20 ENCOUNTER — Encounter: Payer: Self-pay | Admitting: *Deleted

## 2020-12-20 LAB — TSH: TSH: 1.658 u[IU]/mL (ref 0.320–4.118)

## 2020-12-28 ENCOUNTER — Telehealth: Payer: Self-pay | Admitting: Internal Medicine

## 2020-12-28 ENCOUNTER — Other Ambulatory Visit (HOSPITAL_BASED_OUTPATIENT_CLINIC_OR_DEPARTMENT_OTHER): Payer: Self-pay

## 2020-12-28 ENCOUNTER — Telehealth (INDEPENDENT_AMBULATORY_CARE_PROVIDER_SITE_OTHER): Payer: Medicare Other | Admitting: Internal Medicine

## 2020-12-28 ENCOUNTER — Encounter: Payer: Self-pay | Admitting: Internal Medicine

## 2020-12-28 ENCOUNTER — Encounter: Payer: Self-pay | Admitting: Family

## 2020-12-28 VITALS — BP 155/91 | HR 75 | Temp 97.7°F

## 2020-12-28 DIAGNOSIS — I10 Essential (primary) hypertension: Secondary | ICD-10-CM | POA: Diagnosis not present

## 2020-12-28 DIAGNOSIS — U071 COVID-19: Secondary | ICD-10-CM

## 2020-12-28 DIAGNOSIS — I252 Old myocardial infarction: Secondary | ICD-10-CM

## 2020-12-28 DIAGNOSIS — C679 Malignant neoplasm of bladder, unspecified: Secondary | ICD-10-CM | POA: Diagnosis not present

## 2020-12-28 MED ORDER — NIRMATRELVIR/RITONAVIR (PAXLOVID) TABLET (RENAL DOSING)
2.0000 | ORAL_TABLET | Freq: Two times a day (BID) | ORAL | 0 refills | Status: AC
  Filled 2020-12-28: qty 20, 5d supply, fill #0

## 2020-12-28 MED ORDER — BENZONATATE 100 MG PO CAPS
100.0000 mg | ORAL_CAPSULE | Freq: Three times a day (TID) | ORAL | 1 refills | Status: DC | PRN
  Filled 2020-12-28: qty 30, 10d supply, fill #0

## 2020-12-28 NOTE — Telephone Encounter (Signed)
[name] [phone]  Isaac [name] woke up on Wednesday with sore throat and cough, chills and runny nose, he started taking Musnex. He done COVID test this morning that was positive. Has had 2 vaccines and 2 boosters

## 2020-12-28 NOTE — Patient Instructions (Addendum)
Take renal disease dose of Paxlovid.  Take Tessalon Perles as needed for cough.  Stay well-hydrated.  Walk some to prevent atelectasis and monitor pulse oximetry.  Call if symptoms not improving in 48 hours or sooner if worse.

## 2020-12-28 NOTE — Progress Notes (Addendum)
   Subjective:    Patient ID: Isaac [name], male    DOB: 06/16/1955, 65 y.o.   MRN: [id number]  HPI 65 year old Male with complex medical issues including heart disease, insulin-dependent DM, history of right-sided nephrectomy and ureteral surgery for Stage III transitional cell cancer seen today via interactive audio and video telecommunications due to the Coronavirus pandemic and his positive home COVID test.   He is identified using 2 identifiers as Isaac [name]. [name], a longstanding patient in this practice.  He is agreeable to visit in this format today.  He is at his home and I am at my office.  He is followed by Urology, Dr. Steffanie Dunn, Endocrinologist and Dr. [name], Oncologist.  History of robotic rectosigmoid resection October 2021 by Dr. Johney Maine.  Dr. [name] has been concerned regarding his anemia as well as low white blood cell count.  MCV has been normal.  TSH was checked recently and was normal.  He was complaining of fatigue.  He had a PET scan that did not show any recurrence of his cancer.  He called today saying he tested positive for COVID-19 and was requesting advice.  Patient complaining of cough.  He has sinus pressure.  He awakened on Wednesday morning with malaise and had a bad sore throat.  Says his Accu-Cheks have been ranging from 71 to 150.  Says he is no longer taking Norvasc for hypertension.  Says his sister is getting married tomorrow and he is disappointed he will not be able to attend the wedding due to being positive for COVID-19.      Review of Systems see above denies fever or shaking chills.  No nausea or vomiting.     Objective:   Physical Exam Blood pressure 155/91 taken by patient at home, pulse 75, temperature 97.7 degrees  He looks pale and fatigued.  He sounds a bit congested nasally.  Does not appear to be tachypneic.       Assessment & Plan:  Acute COVID-19 virus infection.  Records indicate he has had 4 COVID  vaccines.  However, he was going out and about without a mask.  Reminded him this was not a good idea with his multiple medical issues.  Plan: He was given Paxlovid (Renal  disease dose) and Tessalon Perles 100 mg up to 3 times daily as needed for cough.  He is to find his home pulse oximetry device and use it regularly to monitor his oxygen level.  He is to walk some to prevent atelectasis.  He is to call if symptoms worsen.  He is to stay well-hydrated.

## 2020-12-28 NOTE — Telephone Encounter (Signed)
scheduled

## 2020-12-31 ENCOUNTER — Telehealth: Payer: Self-pay

## 2020-12-31 ENCOUNTER — Telehealth: Payer: Self-pay | Admitting: Internal Medicine

## 2020-12-31 NOTE — Telephone Encounter (Signed)
[place] results to [facility] [phone], phone [phone]  Positive 12/28/2020

## 2020-12-31 NOTE — Telephone Encounter (Signed)
Patient called he feels like the PAXLOVID is working he feels better no fever no body aches. Still has a cough and sinuses are running.   [phone]

## 2021-01-10 DIAGNOSIS — Z794 Long term (current) use of insulin: Secondary | ICD-10-CM | POA: Diagnosis not present

## 2021-01-10 DIAGNOSIS — E1142 Type 2 diabetes mellitus with diabetic polyneuropathy: Secondary | ICD-10-CM | POA: Diagnosis not present

## 2021-01-10 DIAGNOSIS — N1832 Chronic kidney disease, stage 3b: Secondary | ICD-10-CM | POA: Diagnosis not present

## 2021-01-10 DIAGNOSIS — E1165 Type 2 diabetes mellitus with hyperglycemia: Secondary | ICD-10-CM | POA: Diagnosis not present

## 2021-01-10 DIAGNOSIS — E1140 Type 2 diabetes mellitus with diabetic neuropathy, unspecified: Secondary | ICD-10-CM | POA: Diagnosis not present

## 2021-02-01 ENCOUNTER — Other Ambulatory Visit (HOSPITAL_BASED_OUTPATIENT_CLINIC_OR_DEPARTMENT_OTHER): Payer: Self-pay

## 2021-02-01 ENCOUNTER — Encounter: Payer: Self-pay | Admitting: Family

## 2021-02-01 DIAGNOSIS — C678 Malignant neoplasm of overlapping sites of bladder: Secondary | ICD-10-CM | POA: Diagnosis not present

## 2021-02-01 DIAGNOSIS — N5201 Erectile dysfunction due to arterial insufficiency: Secondary | ICD-10-CM | POA: Diagnosis not present

## 2021-02-01 MED ORDER — DIAZEPAM 10 MG PO TABS
ORAL_TABLET | ORAL | 0 refills | Status: DC
  Filled 2021-02-01: qty 2, 1d supply, fill #0

## 2021-02-01 MED ORDER — TADALAFIL 5 MG PO TABS
ORAL_TABLET | ORAL | 3 refills | Status: DC
  Filled 2021-02-01: qty 30, 30d supply, fill #0
  Filled 2021-03-11: qty 30, 30d supply, fill #1
  Filled 2021-04-30: qty 30, 30d supply, fill #2
  Filled 2021-06-03: qty 30, 30d supply, fill #3

## 2021-02-01 MED ORDER — TAMSULOSIN HCL 0.4 MG PO CAPS
ORAL_CAPSULE | ORAL | 11 refills | Status: DC
  Filled 2021-02-01: qty 60, 30d supply, fill #0
  Filled 2021-03-11: qty 60, 30d supply, fill #1
  Filled 2021-06-03: qty 60, 30d supply, fill #2
  Filled 2021-07-05: qty 60, 30d supply, fill #3

## 2021-02-03 ENCOUNTER — Other Ambulatory Visit: Payer: Self-pay | Admitting: Internal Medicine

## 2021-02-03 DIAGNOSIS — G2581 Restless legs syndrome: Secondary | ICD-10-CM

## 2021-02-04 ENCOUNTER — Other Ambulatory Visit (HOSPITAL_BASED_OUTPATIENT_CLINIC_OR_DEPARTMENT_OTHER): Payer: Self-pay

## 2021-02-04 ENCOUNTER — Other Ambulatory Visit: Payer: Self-pay

## 2021-02-04 MED ORDER — AMOXICILLIN 500 MG PO CAPS
2000.0000 mg | ORAL_CAPSULE | Freq: Once | ORAL | 0 refills | Status: DC
  Filled 2021-02-04: qty 8, 2d supply, fill #0

## 2021-02-05 ENCOUNTER — Other Ambulatory Visit (HOSPITAL_BASED_OUTPATIENT_CLINIC_OR_DEPARTMENT_OTHER): Payer: Self-pay

## 2021-02-12 ENCOUNTER — Telehealth: Payer: Self-pay | Admitting: Hematology & Oncology

## 2021-02-12 NOTE — Telephone Encounter (Signed)
Patient left vmail to reschedule appointment on 10/6 - called patient back and left message for patient to call back and reschedule .

## 2021-02-14 ENCOUNTER — Other Ambulatory Visit: Payer: Medicare Other

## 2021-02-14 ENCOUNTER — Ambulatory Visit: Payer: Medicare Other | Admitting: Hematology & Oncology

## 2021-02-25 ENCOUNTER — Inpatient Hospital Stay: Payer: Medicare Other | Attending: Hematology & Oncology

## 2021-02-25 ENCOUNTER — Inpatient Hospital Stay: Payer: Medicare Other

## 2021-02-25 ENCOUNTER — Other Ambulatory Visit: Payer: Self-pay

## 2021-02-25 ENCOUNTER — Other Ambulatory Visit (HOSPITAL_BASED_OUTPATIENT_CLINIC_OR_DEPARTMENT_OTHER): Payer: Self-pay

## 2021-02-25 ENCOUNTER — Encounter: Payer: Self-pay | Admitting: Hematology & Oncology

## 2021-02-25 ENCOUNTER — Inpatient Hospital Stay: Payer: Medicare Other | Admitting: Hematology & Oncology

## 2021-02-25 ENCOUNTER — Ambulatory Visit: Payer: Medicare Other | Attending: Internal Medicine

## 2021-02-25 VITALS — BP 140/76 | HR 65 | Temp 98.3°F | Resp 17

## 2021-02-25 VITALS — Wt 221.0 lb

## 2021-02-25 DIAGNOSIS — Z794 Long term (current) use of insulin: Secondary | ICD-10-CM | POA: Insufficient documentation

## 2021-02-25 DIAGNOSIS — C651 Malignant neoplasm of right renal pelvis: Secondary | ICD-10-CM

## 2021-02-25 DIAGNOSIS — Z7982 Long term (current) use of aspirin: Secondary | ICD-10-CM | POA: Insufficient documentation

## 2021-02-25 DIAGNOSIS — Z9221 Personal history of antineoplastic chemotherapy: Secondary | ICD-10-CM | POA: Diagnosis not present

## 2021-02-25 DIAGNOSIS — E1101 Type 2 diabetes mellitus with hyperosmolarity with coma: Secondary | ICD-10-CM

## 2021-02-25 DIAGNOSIS — Z8551 Personal history of malignant neoplasm of bladder: Secondary | ICD-10-CM | POA: Diagnosis not present

## 2021-02-25 DIAGNOSIS — D649 Anemia, unspecified: Secondary | ICD-10-CM | POA: Diagnosis not present

## 2021-02-25 DIAGNOSIS — E119 Type 2 diabetes mellitus without complications: Secondary | ICD-10-CM | POA: Diagnosis not present

## 2021-02-25 DIAGNOSIS — Z452 Encounter for adjustment and management of vascular access device: Secondary | ICD-10-CM | POA: Diagnosis not present

## 2021-02-25 DIAGNOSIS — Z905 Acquired absence of kidney: Secondary | ICD-10-CM | POA: Insufficient documentation

## 2021-02-25 DIAGNOSIS — R5383 Other fatigue: Secondary | ICD-10-CM

## 2021-02-25 DIAGNOSIS — Z79899 Other long term (current) drug therapy: Secondary | ICD-10-CM | POA: Insufficient documentation

## 2021-02-25 DIAGNOSIS — Z23 Encounter for immunization: Secondary | ICD-10-CM

## 2021-02-25 LAB — CMP (CANCER CENTER ONLY)
ALT: 32 U/L (ref 0–44)
AST: 23 U/L (ref 15–41)
Albumin: 3.9 g/dL (ref 3.5–5.0)
Alkaline Phosphatase: 55 U/L (ref 38–126)
Anion gap: 8 (ref 5–15)
BUN: 27 mg/dL — ABNORMAL HIGH (ref 8–23)
CO2: 25 mmol/L (ref 22–32)
Calcium: 8.5 mg/dL — ABNORMAL LOW (ref 8.9–10.3)
Chloride: 103 mmol/L (ref 98–111)
Creatinine: 1.91 mg/dL — ABNORMAL HIGH (ref 0.61–1.24)
GFR, Estimated: 38 mL/min — ABNORMAL LOW (ref >60)
Glucose, Bld: 205 mg/dL — ABNORMAL HIGH (ref 70–99)
Potassium: 4.2 mmol/L (ref 3.5–5.1)
Sodium: 136 mmol/L (ref 135–145)
Total Bilirubin: 0.5 mg/dL (ref 0.3–1.2)
Total Protein: 6 g/dL — ABNORMAL LOW (ref 6.5–8.1)

## 2021-02-25 LAB — CBC WITH DIFFERENTIAL (CANCER CENTER ONLY)
Abs Immature Granulocytes: 0.04 10*3/uL (ref 0.00–0.07)
Basophils Absolute: 0 10*3/uL (ref 0.0–0.1)
Basophils Relative: 1 %
Eosinophils Absolute: 0.1 10*3/uL (ref 0.0–0.5)
Eosinophils Relative: 2 %
HCT: 37.6 % — ABNORMAL LOW (ref 39.0–52.0)
Hemoglobin: 12.2 g/dL — ABNORMAL LOW (ref 13.0–17.0)
Immature Granulocytes: 1 %
Lymphocytes Relative: 36 %
Lymphs Abs: 1.4 10*3/uL (ref 0.7–4.0)
MCH: 29.3 pg (ref 26.0–34.0)
MCHC: 32.4 g/dL (ref 30.0–36.0)
MCV: 90.4 fL (ref 80.0–100.0)
Monocytes Absolute: 0.4 10*3/uL (ref 0.1–1.0)
Monocytes Relative: 10 %
Neutro Abs: 2 10*3/uL (ref 1.7–7.7)
Neutrophils Relative %: 50 %
Platelet Count: 135 10*3/uL — ABNORMAL LOW (ref 150–400)
RBC: 4.16 MIL/uL — ABNORMAL LOW (ref 4.22–5.81)
RDW: 15 % (ref 11.5–15.5)
WBC Count: 4 10*3/uL (ref 4.0–10.5)
nRBC: 0 % (ref 0.0–0.2)

## 2021-02-25 LAB — RETICULOCYTES
Immature Retic Fract: 19.9 % — ABNORMAL HIGH (ref 2.3–15.9)
RBC.: 4.1 MIL/uL — ABNORMAL LOW (ref 4.22–5.81)
Retic Count, Absolute: 75 10*3/uL (ref 19.0–186.0)
Retic Ct Pct: 1.8 % (ref 0.4–3.1)

## 2021-02-25 LAB — VITAMIN B12: Vitamin B-12: 329 pg/mL (ref 180–914)

## 2021-02-25 LAB — SAVE SMEAR(SSMR), FOR PROVIDER SLIDE REVIEW

## 2021-02-25 MED ORDER — SODIUM CHLORIDE 0.9% FLUSH
10.0000 mL | INTRAVENOUS | Status: DC | PRN
  Administered 2021-02-25: 10 mL via INTRAVENOUS

## 2021-02-25 MED ORDER — INFLUENZA VAC A&B SA ADJ QUAD 0.5 ML IM PRSY
PREFILLED_SYRINGE | INTRAMUSCULAR | 0 refills | Status: DC
  Filled 2021-02-25: qty 0.5, 1d supply, fill #0

## 2021-02-25 MED ORDER — HEPARIN SOD (PORK) LOCK FLUSH 100 UNIT/ML IV SOLN
500.0000 [IU] | Freq: Once | INTRAVENOUS | Status: AC
  Administered 2021-02-25: 500 [IU] via INTRAVENOUS

## 2021-02-25 NOTE — Progress Notes (Signed)
Hematology and Oncology Follow Up Visit  Isaac [name] [id number] 1955-06-10 65 y.o. 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  He is doing pretty well.  He did have COVID I think back in August.  He got through this.  He has been quite busy working on his car.  He is showing a lot of pictures.  I am very impressed with how well the car looks.  Is still is dealing with his diabetes.  I do not have his blood sugar back yet.  He has had no problems with his bowels or bladder.  He has had no bleeding.  There has been no leg swelling.  He says that the left foot does seem to hurt on occasion.  He has had no headache.  There is been no mouth sores.   His last TSH back in August was 1.7.  Overall, I would say his performance status is probably ECOG 1.      Medications:  Allergies as of 02/25/2021       Reactions   Hydrocodone Itching, Nausea And Vomiting   Nsaids Other (See Comments)   Has 1 kidney left   Dilaudid [hydromorphone] Nausea And Vomiting        Medication List        Accurate as of February 25, 2021  1:14 PM. If you have any questions, ask your nurse or doctor.          STOP taking these medications    benzonatate 100 MG capsule Commonly known as: TESSALON Stopped by: [name], MD   gabapentin 300 MG capsule Commonly known as: NEURONTIN Stopped by: [name], MD       TAKE these medications    acetaminophen 500 MG tablet Commonly known as: TYLENOL Take 500 mg by mouth every 6 (six) hours as needed for moderate pain or headache.   ALPRAZolam 1 MG tablet Commonly known as: XANAX Take 1 tablet (1 mg total) by mouth 3  (three) times daily as needed for anxiety.   aspirin EC 81 MG tablet Take 81 mg by mouth at bedtime.   diazepam 10 MG tablet Commonly known as: Valium Take 2 tablets by mouth once, take one hour prior to procedure   finasteride 5 MG tablet Commonly known as: PROSCAR TAKE 1 TABLET BY MOUTH ONCE DAILY   glimepiride 2 MG tablet Commonly known as: AMARYL TAKE 1 BY MOUTH DAILY BEFORE SUPPER What changed:  how much to take how to take this when to take this additional instructions   insulin glargine (1 Unit Dial) 300 UNIT/ML Solostar Pen Commonly known as: TOUJEO Inject 40 Units into the skin 2 (two) times daily.   lidocaine-prilocaine cream Commonly known as: EMLA APPLY ON TO THE SKIN AS NEEDED   metoprolol succinate 25 MG 24 hr tablet Commonly known as: TOPROL-XL TAKE 1 TABLET BY MOUTH AT BEDTIME. GENERIC EQUIVALENT FOR TOPROL-XL   nitroGLYCERIN 0.4 MG SL tablet Commonly known as: NITROSTAT Place 1 tablet (0.4 mg total) under the tongue every 5 (five) minutes as needed for chest pain. Repeat every 5 minute up to 3 doses if no relief call 911.   ondansetron 4  MG tablet Commonly known as: ZOFRAN Take 4 mg by mouth every 8 (eight) hours as needed for nausea/vomiting.   Ozempic (0.25 or 0.5 MG/DOSE) 2 MG/1.5ML Sopn Generic drug: Semaglutide(0.25 or 0.5MG /DOS) Inject 0.25 mg into the skin once a week.   pantoprazole 40 MG tablet Commonly known as: PROTONIX Take 1 tablet (40 mg total) by mouth every morning.   PRESCRIPTION MEDICATION Inhale into the lungs at bedtime. CPAP   ranolazine 500 MG 12 hr tablet Commonly known as: Ranexa Take 1 tablet (500 mg total) by mouth 2 (two) times daily. What changed: Another medication with the same name was removed. Continue taking this medication, and follow the directions you see here. Changed by: [name], MD   rOPINIRole 2 MG tablet Commonly known as: REQUIP TAKE 1 TABLET BY MOUTH AT BEDTIME   rosuvastatin 10 MG  tablet Commonly known as: CRESTOR Take 1 tablet (10 mg total) by mouth every other day.   tadalafil 5 MG tablet Commonly known as: CIALIS Take 1 tablet by mouth everyday   tamsulosin 0.4 MG Caps capsule Commonly known as: FLOMAX Take 2 capsules by mouth everyday        Allergies:  Allergies  Allergen Reactions   Hydrocodone Itching and Nausea And Vomiting   Nsaids Other (See Comments)    Has 1 kidney left   Dilaudid [Hydromorphone] Nausea And Vomiting    Past Medical History, Surgical history, Social history, and Family History were reviewed and updated.  Review of Systems: Review of Systems  Constitutional: Negative.   HENT: Negative.    Eyes: Negative.   Respiratory: Negative.    Cardiovascular: Negative.   Gastrointestinal: Negative.   Genitourinary: Negative.   Musculoskeletal:  Positive for back pain.  Skin: Negative.   Neurological:  Positive for headaches.  Endo/Heme/Allergies: Negative.   Psychiatric/Behavioral: Negative.      Physical Exam:  weight is 221 lb (100.2 kg).   Wt Readings from Last 3 Encounters:  02/25/21 221 lb (100.2 kg)  12/19/20 226 lb (102.5 kg)  12/10/20 222 lb (100.7 kg)    Physical Exam Vitals reviewed.  HENT:     Head: Normocephalic and atraumatic.  Eyes:     Pupils: Pupils are equal, round, and reactive to light.  Cardiovascular:     Rate and Rhythm: Normal rate and regular rhythm.     Heart sounds: Normal heart sounds.  Pulmonary:     Effort: Pulmonary effort is normal.     Breath sounds: Normal breath sounds.  Abdominal:     General: Bowel sounds are normal.     Palpations: Abdomen is soft.  Musculoskeletal:        General: No tenderness or deformity. Normal range of motion.     Cervical back: Normal range of motion.  Lymphadenopathy:     Cervical: No cervical adenopathy.  Skin:    General: Skin is warm and dry.     Findings: No erythema or rash.  Neurological:     Mental Status: He is alert and oriented to  person, place, and time.  Psychiatric:        Behavior: Behavior normal.        Thought Content: Thought content normal.        Judgment: Judgment normal.    Lab Results  Component Value Date   WBC 4.0 02/25/2021   HGB 12.2 (L) 02/25/2021   HCT 37.6 (L) 02/25/2021   MCV 90.4 02/25/2021   PLT 135 (L) 02/25/2021  Lab Results  Component Value Date   FERRITIN 52 07/06/2018   IRON 98 07/06/2018   TIBC 371 07/06/2018   UIBC 273 07/06/2018   IRONPCTSAT 27 07/06/2018   Lab Results  Component Value Date   RETICCTPCT 1.8 02/25/2021   RBC 4.10 (L) 02/25/2021   RBC 4.16 (L) 02/25/2021   No results found for: Nils Pyle, [facility] Lab Results  Component Value Date   IGA 232 01/25/2015   No results found for: Odetta Pink, [facility]   Chemistry      Component Value Date/Time   NA 136 12/19/2020 1409   NA 134 05/04/2017 0925   K 4.4 12/19/2020 1409   K 4.5 05/04/2017 0925   CL 103 12/19/2020 1409   CL 97 (L) 05/04/2017 0925   CO2 23 12/19/2020 1409   CO2 23 05/04/2017 0925   BUN 22 12/19/2020 1409   BUN 28 (H) 05/04/2017 0925   CREATININE 2.31 (H) 12/19/2020 1409   CREATININE 1.95 (H) 03/07/2019 1038   GLU 86 03/31/2016 0000      Component Value Date/Time   CALCIUM 8.5 (L) 12/19/2020 1409   CALCIUM 9.3 05/04/2017 0925   ALKPHOS 54 12/19/2020 1409   ALKPHOS 58 05/04/2017 0925   AST 20 12/19/2020 1409   ALT 28 12/19/2020 1409   ALT 90 (H) 05/04/2017 0925   BILITOT 0.4 12/19/2020 1409       Impression and Plan: Isaac [name] is a very pleasant 65 yo caucasian gentleman with infiltrating high grade papillary urothelial carcinoma with right nephrectomy in October 2018 and adjuvant chemotherapy.   I am glad that there is blood counts doing better.  Not sure why they were trending downward.  I do not see that we have to do any scans on him.  Its been 3 years that he has had treatment.  Still think his  biggest problem will be the diabetes.  We will plan to get back to see [place] after all the holidays now.  We will plan to get him back in January or February.  [name], MD 10/17/20221:14 PM

## 2021-02-25 NOTE — Progress Notes (Signed)
   Covid-19 Vaccination Clinic  Name:  Isaac [name]    MRN: [id number] DOB: 1955/09/29  02/25/2021  [name] was observed post Covid-19 immunization for 15 minutes without incident. He was provided with Vaccine Information Sheet and instruction to access the V-Safe system.   [name] was instructed to call 911 with any severe reactions post vaccine: Difficulty breathing  Swelling of face and throat  A fast heartbeat  A bad rash all over body  Dizziness and weakness

## 2021-02-25 NOTE — Patient Instructions (Signed)
Implanted Port Home Guide An implanted port is a device that is placed under the skin. It is usually placed in the chest. The device can be used to give IV medicine, to take blood, or for dialysis. You may have an implanted port if: You need IV medicine that would be irritating to the small veins in your hands or arms. You need IV medicines, such as antibiotics, for a long period of time. You need IV nutrition for a long period of time. You need dialysis. When you have a port, your health care provider can choose to use the port instead of veins in your arms for these procedures. You may have fewer limitations when using a port than you would if you used other types of long-term IVs, and you will likely be able to return to normal activities after your incision heals. An implanted port has two main parts: Reservoir. The reservoir is the part where a needle is inserted to give medicines or draw blood. The reservoir is round. After it is placed, it appears as a small, raised area under your skin. Catheter. The catheter is a thin, flexible tube that connects the reservoir to a vein. Medicine that is inserted into the reservoir goes into the catheter and then into the vein. How is my port accessed? To access your port: A numbing cream may be placed on the skin over the port site. Your health care provider will put on a mask and sterile gloves. The skin over your port will be cleaned carefully with a germ-killing soap and allowed to dry. Your health care provider will gently pinch the port and insert a needle into it. Your health care provider will check for a blood return to make sure the port is in the vein and is not clogged. If your port needs to remain accessed to get medicine continuously (constant infusion), your health care provider will place a clear bandage (dressing) over the needle site. The dressing and needle will need to be changed every week, or as told by your health care provider. What  is flushing? Flushing helps keep the port from getting clogged. Follow instructions from your health care provider about how and when to flush the port. Ports are usually flushed with saline solution or a medicine called heparin. The need for flushing will depend on how the port is used: If the port is only used from time to time to give medicines or draw blood, the port may need to be flushed: Before and after medicines have been given. Before and after blood has been drawn. As part of routine maintenance. Flushing may be recommended every 4-6 weeks. If a constant infusion is running, the port may not need to be flushed. Throw away any syringes in a disposal container that is meant for sharp items (sharps container). You can buy a sharps container from a pharmacy, or you can make one by using an empty hard plastic bottle with a cover. How long will my port stay implanted? The port can stay in for as long as your health care provider thinks it is needed. When it is time for the port to come out, a surgery will be done to remove it. The surgery will be similar to the procedure that was done to put the port in. Follow these instructions at home:  Flush your port as told by your health care provider. If you need an infusion over several days, follow instructions from your health care provider about how   to take care of your port site. Make sure you: Wash your hands with soap and water before you change your dressing. If soap and water are not available, use alcohol-based hand sanitizer. Change your dressing as told by your health care provider. Place any used dressings or infusion bags into a plastic bag. Throw that bag in the trash. Keep the dressing that covers the needle clean and dry. Do not get it wet. Do not use scissors or sharp objects near the tube. Keep the tube clamped, unless it is being used. Check your port site every day for signs of infection. Check for: Redness, swelling, or  pain. Fluid or blood. Pus or a bad smell. Protect the skin around the port site. Avoid wearing bra straps that rub or irritate the site. Protect the skin around your port from seat belts. Place a soft pad over your chest if needed. Bathe or shower as told by your health care provider. The site may get wet as long as you are not actively receiving an infusion. Return to your normal activities as told by your health care provider. Ask your health care provider what activities are safe for you. Carry a medical alert card or wear a medical alert bracelet at all times. This will let health care providers know that you have an implanted port in case of an emergency. Get help right away if: You have redness, swelling, or pain at the port site. You have fluid or blood coming from your port site. You have pus or a bad smell coming from the port site. You have a fever. Summary Implanted ports are usually placed in the chest for long-term IV access. Follow instructions from your health care provider about flushing the port and changing bandages (dressings). Take care of the area around your port by avoiding clothing that puts pressure on the area, and by watching for signs of infection. Protect the skin around your port from seat belts. Place a soft pad over your chest if needed. Get help right away if you have a fever or you have redness, swelling, pain, drainage, or a bad smell at the port site. This information is not intended to replace advice given to you by your health care provider. Make sure you discuss any questions you have with your health care provider. Document Revised: 07/18/2020 Document Reviewed: 09/12/2019 Elsevier Patient Education  2022 [employer]  

## 2021-02-26 LAB — IRON AND TIBC
Iron: 70 ug/dL (ref 42–163)
Saturation Ratios: 20 % (ref 20–55)
TIBC: 359 ug/dL (ref 202–409)
UIBC: 289 ug/dL (ref 117–376)

## 2021-02-26 LAB — FERRITIN: Ferritin: 21 ng/mL — ABNORMAL LOW (ref 24–336)

## 2021-02-28 ENCOUNTER — Telehealth: Payer: Self-pay

## 2021-02-28 ENCOUNTER — Other Ambulatory Visit: Payer: Self-pay | Admitting: Family

## 2021-02-28 NOTE — Telephone Encounter (Signed)
-----   Message from [name], MD sent at 02/26/2021 12:37 PM EDT ----- Please call and tell him that the iron is actually on the low side.  We can get him in for IV iron if you would like.  Just let me know and I can put the orders then.  Laurey Arrow

## 2021-03-05 ENCOUNTER — Telehealth: Payer: Self-pay | Admitting: Hematology & Oncology

## 2021-03-06 ENCOUNTER — Other Ambulatory Visit: Payer: Self-pay | Admitting: Family

## 2021-03-06 ENCOUNTER — Telehealth: Payer: Self-pay | Admitting: *Deleted

## 2021-03-06 NOTE — Telephone Encounter (Signed)
Per scheduling message Isaac [name] - called patient and lvm for callback to schedule (1) dose of IV Iron

## 2021-03-08 DIAGNOSIS — G620 Drug-induced polyneuropathy: Secondary | ICD-10-CM | POA: Diagnosis not present

## 2021-03-08 DIAGNOSIS — G4733 Obstructive sleep apnea (adult) (pediatric): Secondary | ICD-10-CM | POA: Diagnosis not present

## 2021-03-08 DIAGNOSIS — M21621 Bunionette of right foot: Secondary | ICD-10-CM | POA: Diagnosis not present

## 2021-03-08 DIAGNOSIS — G5752 Tarsal tunnel syndrome, left lower limb: Secondary | ICD-10-CM | POA: Diagnosis not present

## 2021-03-08 DIAGNOSIS — E1142 Type 2 diabetes mellitus with diabetic polyneuropathy: Secondary | ICD-10-CM | POA: Diagnosis not present

## 2021-03-11 ENCOUNTER — Other Ambulatory Visit: Payer: Self-pay

## 2021-03-11 ENCOUNTER — Encounter: Payer: Self-pay | Admitting: Family

## 2021-03-11 ENCOUNTER — Inpatient Hospital Stay: Payer: Medicare Other

## 2021-03-11 ENCOUNTER — Other Ambulatory Visit (HOSPITAL_BASED_OUTPATIENT_CLINIC_OR_DEPARTMENT_OTHER): Payer: Self-pay

## 2021-03-11 VITALS — BP 154/72 | HR 60 | Temp 98.2°F | Resp 18

## 2021-03-11 DIAGNOSIS — Z905 Acquired absence of kidney: Secondary | ICD-10-CM | POA: Diagnosis not present

## 2021-03-11 DIAGNOSIS — Z79899 Other long term (current) drug therapy: Secondary | ICD-10-CM | POA: Diagnosis not present

## 2021-03-11 DIAGNOSIS — Z9221 Personal history of antineoplastic chemotherapy: Secondary | ICD-10-CM | POA: Diagnosis not present

## 2021-03-11 DIAGNOSIS — Z7982 Long term (current) use of aspirin: Secondary | ICD-10-CM | POA: Diagnosis not present

## 2021-03-11 DIAGNOSIS — D649 Anemia, unspecified: Secondary | ICD-10-CM | POA: Diagnosis not present

## 2021-03-11 DIAGNOSIS — Z8551 Personal history of malignant neoplasm of bladder: Secondary | ICD-10-CM | POA: Diagnosis not present

## 2021-03-11 DIAGNOSIS — E119 Type 2 diabetes mellitus without complications: Secondary | ICD-10-CM | POA: Diagnosis not present

## 2021-03-11 DIAGNOSIS — D508 Other iron deficiency anemias: Secondary | ICD-10-CM

## 2021-03-11 DIAGNOSIS — Z452 Encounter for adjustment and management of vascular access device: Secondary | ICD-10-CM | POA: Diagnosis not present

## 2021-03-11 DIAGNOSIS — Z794 Long term (current) use of insulin: Secondary | ICD-10-CM | POA: Diagnosis not present

## 2021-03-11 MED ORDER — HEPARIN SOD (PORK) LOCK FLUSH 100 UNIT/ML IV SOLN
500.0000 [IU] | Freq: Once | INTRAVENOUS | Status: AC | PRN
  Administered 2021-03-11: 500 [IU]

## 2021-03-11 MED ORDER — SODIUM CHLORIDE 0.9 % IV SOLN: Freq: Once | INTRAVENOUS | Status: AC

## 2021-03-11 MED ORDER — SODIUM CHLORIDE 0.9% FLUSH
10.0000 mL | Freq: Once | INTRAVENOUS | Status: AC | PRN
  Administered 2021-03-11: 10 mL

## 2021-03-11 MED ORDER — SODIUM CHLORIDE 0.9 % IV SOLN
1000.0000 mg | Freq: Once | INTRAVENOUS | Status: AC
  Administered 2021-03-11: 1000 mg via INTRAVENOUS
  Filled 2021-03-11: qty 10

## 2021-03-11 NOTE — Patient Instructions (Signed)

## 2021-03-12 ENCOUNTER — Other Ambulatory Visit (HOSPITAL_BASED_OUTPATIENT_CLINIC_OR_DEPARTMENT_OTHER): Payer: Self-pay

## 2021-03-12 MED FILL — Finasteride Tab 5 MG: ORAL | 90 days supply | Qty: 90 | Fill #2 | Status: AC

## 2021-03-25 ENCOUNTER — Other Ambulatory Visit (HOSPITAL_BASED_OUTPATIENT_CLINIC_OR_DEPARTMENT_OTHER): Payer: Self-pay

## 2021-03-25 MED ORDER — PFIZER COVID-19 VAC BIVALENT 30 MCG/0.3ML IM SUSP
INTRAMUSCULAR | 0 refills | Status: DC
  Filled 2021-03-25: qty 0.3, 1d supply, fill #0

## 2021-04-01 ENCOUNTER — Encounter (HOSPITAL_BASED_OUTPATIENT_CLINIC_OR_DEPARTMENT_OTHER): Payer: Self-pay | Admitting: *Deleted

## 2021-04-01 ENCOUNTER — Other Ambulatory Visit: Payer: Self-pay

## 2021-04-01 ENCOUNTER — Emergency Department (HOSPITAL_BASED_OUTPATIENT_CLINIC_OR_DEPARTMENT_OTHER): Payer: Medicare Other

## 2021-04-01 ENCOUNTER — Emergency Department (HOSPITAL_BASED_OUTPATIENT_CLINIC_OR_DEPARTMENT_OTHER)
Admission: EM | Admit: 2021-04-01 | Discharge: 2021-04-01 | Disposition: A | Discharge status: Home / Self Care | Payer: Medicare Other | Attending: Student | Admitting: Student

## 2021-04-01 DIAGNOSIS — Z8553 Personal history of malignant neoplasm of renal pelvis: Secondary | ICD-10-CM | POA: Insufficient documentation

## 2021-04-01 DIAGNOSIS — M542 Cervicalgia: Secondary | ICD-10-CM | POA: Insufficient documentation

## 2021-04-01 DIAGNOSIS — Y92410 Unspecified street and highway as the place of occurrence of the external cause: Secondary | ICD-10-CM | POA: Diagnosis not present

## 2021-04-01 DIAGNOSIS — Z951 Presence of aortocoronary bypass graft: Secondary | ICD-10-CM | POA: Diagnosis not present

## 2021-04-01 DIAGNOSIS — Z7982 Long term (current) use of aspirin: Secondary | ICD-10-CM | POA: Diagnosis not present

## 2021-04-01 DIAGNOSIS — Z96653 Presence of artificial knee joint, bilateral: Secondary | ICD-10-CM | POA: Diagnosis not present

## 2021-04-01 DIAGNOSIS — Z794 Long term (current) use of insulin: Secondary | ICD-10-CM | POA: Diagnosis not present

## 2021-04-01 DIAGNOSIS — Z85828 Personal history of other malignant neoplasm of skin: Secondary | ICD-10-CM | POA: Diagnosis not present

## 2021-04-01 DIAGNOSIS — M47812 Spondylosis without myelopathy or radiculopathy, cervical region: Secondary | ICD-10-CM | POA: Diagnosis not present

## 2021-04-01 DIAGNOSIS — R42 Dizziness and giddiness: Secondary | ICD-10-CM | POA: Diagnosis not present

## 2021-04-01 DIAGNOSIS — Z8551 Personal history of malignant neoplasm of bladder: Secondary | ICD-10-CM | POA: Insufficient documentation

## 2021-04-01 DIAGNOSIS — H538 Other visual disturbances: Secondary | ICD-10-CM | POA: Diagnosis not present

## 2021-04-01 DIAGNOSIS — Z79899 Other long term (current) drug therapy: Secondary | ICD-10-CM | POA: Diagnosis not present

## 2021-04-01 DIAGNOSIS — Z041 Encounter for examination and observation following transport accident: Secondary | ICD-10-CM | POA: Diagnosis not present

## 2021-04-01 DIAGNOSIS — I2510 Atherosclerotic heart disease of native coronary artery without angina pectoris: Secondary | ICD-10-CM | POA: Insufficient documentation

## 2021-04-01 DIAGNOSIS — Z7984 Long term (current) use of oral hypoglycemic drugs: Secondary | ICD-10-CM | POA: Insufficient documentation

## 2021-04-01 DIAGNOSIS — Z8554 Personal history of malignant neoplasm of ureter: Secondary | ICD-10-CM | POA: Insufficient documentation

## 2021-04-01 DIAGNOSIS — S0990XA Unspecified injury of head, initial encounter: Secondary | ICD-10-CM | POA: Diagnosis not present

## 2021-04-01 DIAGNOSIS — Z87891 Personal history of nicotine dependence: Secondary | ICD-10-CM | POA: Diagnosis not present

## 2021-04-01 DIAGNOSIS — I129 Hypertensive chronic kidney disease with stage 1 through stage 4 chronic kidney disease, or unspecified chronic kidney disease: Secondary | ICD-10-CM | POA: Insufficient documentation

## 2021-04-01 DIAGNOSIS — E1122 Type 2 diabetes mellitus with diabetic chronic kidney disease: Secondary | ICD-10-CM | POA: Diagnosis not present

## 2021-04-01 DIAGNOSIS — N183 Chronic kidney disease, stage 3 unspecified: Secondary | ICD-10-CM | POA: Diagnosis not present

## 2021-04-01 MED ORDER — METHOCARBAMOL 500 MG PO TABS
500.0000 mg | ORAL_TABLET | Freq: Two times a day (BID) | ORAL | 0 refills | Status: AC
  Filled 2021-04-01: qty 14, 7d supply, fill #0

## 2021-04-01 MED ORDER — ACETAMINOPHEN 325 MG PO TABS
650.0000 mg | ORAL_TABLET | Freq: Once | ORAL | Status: AC
  Administered 2021-04-01: 650 mg via ORAL
  Filled 2021-04-01: qty 2

## 2021-04-01 NOTE — ED Notes (Signed)
Patient transported to CT 

## 2021-04-01 NOTE — ED Notes (Signed)
D/c paperwork reviewed with pt and pts sister. All questions addressed prior to discharge. Pt wheeled to ED exit.
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
## 2021-04-02 ENCOUNTER — Other Ambulatory Visit (HOSPITAL_BASED_OUTPATIENT_CLINIC_OR_DEPARTMENT_OTHER): Payer: Self-pay

## 2021-04-08 DIAGNOSIS — G4733 Obstructive sleep apnea (adult) (pediatric): Secondary | ICD-10-CM | POA: Diagnosis not present
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
## 2021-04-09 ENCOUNTER — Ambulatory Visit (INDEPENDENT_AMBULATORY_CARE_PROVIDER_SITE_OTHER): Payer: Medicare Other | Admitting: Sports Medicine

## 2021-04-09 ENCOUNTER — Other Ambulatory Visit: Payer: Self-pay

## 2021-04-09 ENCOUNTER — Other Ambulatory Visit: Payer: Medicare Other | Admitting: Internal Medicine

## 2021-04-09 VITALS — BP 112/76 | HR 89 | Ht 69.0 in | Wt 218.0 lb

## 2021-04-09 DIAGNOSIS — H538 Other visual disturbances: Secondary | ICD-10-CM | POA: Diagnosis not present

## 2021-04-09 DIAGNOSIS — G44319 Acute post-traumatic headache, not intractable: Secondary | ICD-10-CM

## 2021-04-09 DIAGNOSIS — Z794 Long term (current) use of insulin: Secondary | ICD-10-CM

## 2021-04-09 DIAGNOSIS — E7849 Other hyperlipidemia: Secondary | ICD-10-CM

## 2021-04-09 DIAGNOSIS — Z125 Encounter for screening for malignant neoplasm of prostate: Secondary | ICD-10-CM | POA: Diagnosis not present

## 2021-04-09 DIAGNOSIS — R270 Ataxia, unspecified: Secondary | ICD-10-CM | POA: Diagnosis not present

## 2021-04-09 DIAGNOSIS — S060X1A Concussion with loss of consciousness of 30 minutes or less, initial encounter: Secondary | ICD-10-CM | POA: Diagnosis not present

## 2021-04-09 DIAGNOSIS — E119 Type 2 diabetes mellitus without complications: Secondary | ICD-10-CM | POA: Diagnosis not present

## 2021-04-09 DIAGNOSIS — S060X0A Concussion without loss of consciousness, initial encounter: Secondary | ICD-10-CM | POA: Diagnosis not present

## 2021-04-09 DIAGNOSIS — I10 Essential (primary) hypertension: Secondary | ICD-10-CM

## 2021-04-09 NOTE — Patient Instructions (Addendum)
Good to see you Start melatonin 5mg  nightly Star muscle relaxer prescribed by the ED nightly Referral placed to ophthalmology to [facility] phone number [phone] to call and schedule if you do not hear from them. Limit physical and mental activities to symptoms less than 3/10 Tylenol as needed for headaches  Neck exercises given  Follow up in 1 week

## 2021-04-10 LAB — COMPLETE METABOLIC PANEL WITH GFR
AG Ratio: 1.8 (calc) (ref 1.0–2.5)
ALT: 37 U/L (ref 9–46)
AST: 21 U/L (ref 10–35)
Albumin: 4.2 g/dL (ref 3.6–5.1)
Alkaline phosphatase (APISO): 56 U/L (ref 35–144)
BUN/Creatinine Ratio: 11 (calc) (ref 6–22)
BUN: 20 mg/dL (ref 7–25)
CO2: 29 mmol/L (ref 20–32)
Calcium: 9.1 mg/dL (ref 8.6–10.3)
Chloride: 102 mmol/L (ref 98–110)
Creat: 1.86 mg/dL — ABNORMAL HIGH (ref 0.70–1.35)
Globulin: 2.3 g/dL (calc) (ref 1.9–3.7)
Glucose, Bld: 154 mg/dL — ABNORMAL HIGH (ref 65–99)
Potassium: 4.4 mmol/L (ref 3.5–5.3)
Sodium: 139 mmol/L (ref 135–146)
Total Bilirubin: 0.7 mg/dL (ref 0.2–1.2)
Total Protein: 6.5 g/dL (ref 6.1–8.1)
eGFR: 40 mL/min/{1.73_m2} — ABNORMAL LOW (ref >=60)

## 2021-04-10 LAB — CBC WITH DIFFERENTIAL/PLATELET
Absolute Monocytes: 407 cells/uL (ref 200–950)
Basophils Absolute: 29 cells/uL (ref 0–200)
Basophils Relative: 0.7 %
Eosinophils Absolute: 71 cells/uL (ref 15–500)
Eosinophils Relative: 1.7 %
HCT: 43.2 % (ref 38.5–50.0)
Hemoglobin: 14.2 g/dL (ref 13.2–17.1)
Lymphs Abs: 1558 cells/uL (ref 850–3900)
MCH: 31.1 pg (ref 27.0–33.0)
MCHC: 32.9 g/dL (ref 32.0–36.0)
MCV: 94.7 fL (ref 80.0–100.0)
MPV: 9.3 fL (ref 7.5–12.5)
Monocytes Relative: 9.7 %
Neutro Abs: 2134 cells/uL (ref 1500–7800)
Neutrophils Relative %: 50.8 %
Platelets: 120 10*3/uL — ABNORMAL LOW (ref 140–400)
RBC: 4.56 10*6/uL (ref 4.20–5.80)
RDW: 15.6 % — ABNORMAL HIGH (ref 11.0–15.0)
Total Lymphocyte: 37.1 %
WBC: 4.2 10*3/uL (ref 3.8–10.8)

## 2021-04-10 LAB — MICROALBUMIN / CREATININE URINE RATIO
Creatinine, Urine: 138 mg/dL (ref 20–320)
Microalb Creat Ratio: 86 mcg/mg creat — ABNORMAL HIGH (ref <30)
Microalb, Ur: 11.8 mg/dL

## 2021-04-10 LAB — LIPID PANEL
Cholesterol: 160 mg/dL (ref <200)
HDL: 34 mg/dL — ABNORMAL LOW (ref >=40)
LDL Cholesterol (Calc): 98 mg/dL (calc)
Non-HDL Cholesterol (Calc): 126 mg/dL (calc) (ref <130)
Total CHOL/HDL Ratio: 4.7 (calc) (ref <5.0)
Triglycerides: 186 mg/dL — ABNORMAL HIGH (ref <150)

## 2021-04-10 LAB — PSA: PSA: 0.19 ng/mL (ref <=4.00)

## 2021-04-10 LAB — HEMOGLOBIN A1C
Hgb A1c MFr Bld: 8.3 % of total Hgb — ABNORMAL HIGH (ref <5.7)
Mean Plasma Glucose: 192 mg/dL
eAG (mmol/L): 10.6 mmol/L

## 2021-04-11 DIAGNOSIS — M170 Bilateral primary osteoarthritis of knee: Secondary | ICD-10-CM | POA: Diagnosis not present

## 2021-04-11 DIAGNOSIS — M25572 Pain in left ankle and joints of left foot: Secondary | ICD-10-CM | POA: Diagnosis not present

## 2021-04-12 DIAGNOSIS — L821 Other seborrheic keratosis: Secondary | ICD-10-CM | POA: Diagnosis not present

## 2021-04-12 DIAGNOSIS — D045 Carcinoma in situ of skin of trunk: Secondary | ICD-10-CM | POA: Diagnosis not present

## 2021-04-12 DIAGNOSIS — D1801 Hemangioma of skin and subcutaneous tissue: Secondary | ICD-10-CM | POA: Diagnosis not present

## 2021-04-12 DIAGNOSIS — L570 Actinic keratosis: Secondary | ICD-10-CM | POA: Diagnosis not present

## 2021-04-12 DIAGNOSIS — C44619 Basal cell carcinoma of skin of left upper limb, including shoulder: Secondary | ICD-10-CM | POA: Diagnosis not present

## 2021-04-12 DIAGNOSIS — B078 Other viral warts: Secondary | ICD-10-CM | POA: Diagnosis not present

## 2021-04-12 DIAGNOSIS — Z85828 Personal history of other malignant neoplasm of skin: Secondary | ICD-10-CM | POA: Diagnosis not present

## 2021-04-15 ENCOUNTER — Encounter: Payer: Self-pay | Admitting: Internal Medicine

## 2021-04-15 ENCOUNTER — Other Ambulatory Visit: Payer: Self-pay

## 2021-04-15 ENCOUNTER — Ambulatory Visit (INDEPENDENT_AMBULATORY_CARE_PROVIDER_SITE_OTHER): Payer: Medicare Other | Admitting: Internal Medicine

## 2021-04-15 ENCOUNTER — Telehealth: Payer: Self-pay | Admitting: Sports Medicine

## 2021-04-15 ENCOUNTER — Telehealth: Payer: Self-pay

## 2021-04-15 VITALS — BP 126/70 | HR 84 | Ht 70.0 in | Wt 213.0 lb

## 2021-04-15 DIAGNOSIS — E119 Type 2 diabetes mellitus without complications: Secondary | ICD-10-CM | POA: Diagnosis not present

## 2021-04-15 DIAGNOSIS — I252 Old myocardial infarction: Secondary | ICD-10-CM | POA: Diagnosis not present

## 2021-04-15 DIAGNOSIS — Z96652 Presence of left artificial knee joint: Secondary | ICD-10-CM | POA: Diagnosis not present

## 2021-04-15 DIAGNOSIS — Z794 Long term (current) use of insulin: Secondary | ICD-10-CM

## 2021-04-15 DIAGNOSIS — F419 Anxiety disorder, unspecified: Secondary | ICD-10-CM

## 2021-04-15 DIAGNOSIS — E7849 Other hyperlipidemia: Secondary | ICD-10-CM

## 2021-04-15 DIAGNOSIS — Z85828 Personal history of other malignant neoplasm of skin: Secondary | ICD-10-CM | POA: Diagnosis not present

## 2021-04-15 DIAGNOSIS — Z86010 Personal history of colonic polyps: Secondary | ICD-10-CM

## 2021-04-15 DIAGNOSIS — C679 Malignant neoplasm of bladder, unspecified: Secondary | ICD-10-CM

## 2021-04-15 DIAGNOSIS — Z0000 Encounter for general adult medical examination without abnormal findings: Secondary | ICD-10-CM

## 2021-04-15 DIAGNOSIS — I10 Essential (primary) hypertension: Secondary | ICD-10-CM

## 2021-04-15 DIAGNOSIS — F32A Depression, unspecified: Secondary | ICD-10-CM

## 2021-04-15 DIAGNOSIS — Z96651 Presence of right artificial knee joint: Secondary | ICD-10-CM

## 2021-04-15 NOTE — Telephone Encounter (Signed)
Printed and faxed
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
## 2021-04-15 NOTE — Telephone Encounter (Signed)
Isaac [name] I still cant print can you help?
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
## 2021-04-15 NOTE — Telephone Encounter (Signed)
Patient called back line requesting notes be faxed over from [facility] to [facility]. Patient was transferred to the appropriate office.

## 2021-04-16 DIAGNOSIS — E119 Type 2 diabetes mellitus without complications: Secondary | ICD-10-CM | POA: Diagnosis not present

## 2021-04-16 DIAGNOSIS — N1832 Chronic kidney disease, stage 3b: Secondary | ICD-10-CM | POA: Diagnosis not present

## 2021-04-16 DIAGNOSIS — Z794 Long term (current) use of insulin: Secondary | ICD-10-CM | POA: Diagnosis not present
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
## 2021-04-17 ENCOUNTER — Other Ambulatory Visit: Payer: Self-pay

## 2021-04-17 ENCOUNTER — Ambulatory Visit (INDEPENDENT_AMBULATORY_CARE_PROVIDER_SITE_OTHER): Payer: Medicare Other | Admitting: Sports Medicine

## 2021-04-17 ENCOUNTER — Other Ambulatory Visit (HOSPITAL_BASED_OUTPATIENT_CLINIC_OR_DEPARTMENT_OTHER): Payer: Self-pay

## 2021-04-17 VITALS — BP 130/74 | HR 73 | Ht 70.0 in | Wt 220.0 lb

## 2021-04-17 DIAGNOSIS — S060X1D Concussion with loss of consciousness of 30 minutes or less, subsequent encounter: Secondary | ICD-10-CM

## 2021-04-17 DIAGNOSIS — G44319 Acute post-traumatic headache, not intractable: Secondary | ICD-10-CM | POA: Diagnosis not present

## 2021-04-17 DIAGNOSIS — R270 Ataxia, unspecified: Secondary | ICD-10-CM

## 2021-04-17 DIAGNOSIS — H538 Other visual disturbances: Secondary | ICD-10-CM

## 2021-04-17 MED ORDER — TRAZODONE HCL 50 MG PO TABS
50.0000 mg | ORAL_TABLET | Freq: Every day | ORAL | 0 refills | Status: DC
  Filled 2021-04-17: qty 50, 50d supply, fill #0

## 2021-04-17 NOTE — Patient Instructions (Addendum)
Good to see you  Referral for vestibular therapy [phone] Do not call until Monday 04/22/2021 Recommend melatonin 5 mg nightly  Trazodone 50 mg nightly  1 week follow up

## 2021-04-18 ENCOUNTER — Encounter: Payer: Self-pay | Admitting: Internal Medicine

## 2021-04-18 ENCOUNTER — Ambulatory Visit (INDEPENDENT_AMBULATORY_CARE_PROVIDER_SITE_OTHER): Payer: Medicare Other

## 2021-04-18 DIAGNOSIS — Z0000 Encounter for general adult medical examination without abnormal findings: Secondary | ICD-10-CM

## 2021-04-18 NOTE — Progress Notes (Signed)
I, Lazariah Savard J Porschia Willbanks, MD, have reviewed all documentation for this visit. The documentation on 04/18/21 for the exam, diagnosis, procedures, and orders are all accurate and complete. 
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
## 2021-04-24 ENCOUNTER — Ambulatory Visit (INDEPENDENT_AMBULATORY_CARE_PROVIDER_SITE_OTHER): Payer: Medicare Other | Admitting: Sports Medicine

## 2021-04-24 ENCOUNTER — Other Ambulatory Visit: Payer: Self-pay

## 2021-04-24 VITALS — BP 122/80 | HR 58 | Ht 70.0 in

## 2021-04-24 DIAGNOSIS — R270 Ataxia, unspecified: Secondary | ICD-10-CM | POA: Diagnosis not present

## 2021-04-24 DIAGNOSIS — G44319 Acute post-traumatic headache, not intractable: Secondary | ICD-10-CM

## 2021-04-24 DIAGNOSIS — H538 Other visual disturbances: Secondary | ICD-10-CM | POA: Diagnosis not present

## 2021-04-24 DIAGNOSIS — S060X1D Concussion with loss of consciousness of 30 minutes or less, subsequent encounter: Secondary | ICD-10-CM | POA: Diagnosis not present

## 2021-04-24 NOTE — Patient Instructions (Addendum)
Good to see you Increase trazodone 200 mg nightly  Continue melatonin 5 mg nightly  Continue with vestibular PT  Continue with eye doctor appointments  Sign release 1 week follow up

## 2021-04-27 ENCOUNTER — Ambulatory Visit (INDEPENDENT_AMBULATORY_CARE_PROVIDER_SITE_OTHER): Payer: Medicare Other

## 2021-04-27 ENCOUNTER — Other Ambulatory Visit: Payer: Self-pay

## 2021-04-27 DIAGNOSIS — R519 Headache, unspecified: Secondary | ICD-10-CM | POA: Diagnosis not present

## 2021-04-27 DIAGNOSIS — R270 Ataxia, unspecified: Secondary | ICD-10-CM | POA: Diagnosis not present

## 2021-04-27 DIAGNOSIS — G9389 Other specified disorders of brain: Secondary | ICD-10-CM | POA: Diagnosis not present

## 2021-04-27 DIAGNOSIS — R410 Disorientation, unspecified: Secondary | ICD-10-CM | POA: Diagnosis not present

## 2021-04-27 DIAGNOSIS — S060X1D Concussion with loss of consciousness of 30 minutes or less, subsequent encounter: Secondary | ICD-10-CM | POA: Diagnosis not present

## 2021-04-27 DIAGNOSIS — G44319 Acute post-traumatic headache, not intractable: Secondary | ICD-10-CM | POA: Diagnosis not present

## 2021-04-27 DIAGNOSIS — H538 Other visual disturbances: Secondary | ICD-10-CM

## 2021-04-29 DIAGNOSIS — G44319 Acute post-traumatic headache, not intractable: Secondary | ICD-10-CM | POA: Diagnosis not present

## 2021-04-29 DIAGNOSIS — G4486 Cervicogenic headache: Secondary | ICD-10-CM | POA: Diagnosis not present

## 2021-04-29 DIAGNOSIS — H538 Other visual disturbances: Secondary | ICD-10-CM | POA: Diagnosis not present

## 2021-04-29 DIAGNOSIS — R270 Ataxia, unspecified: Secondary | ICD-10-CM | POA: Diagnosis not present
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
## 2021-05-01 ENCOUNTER — Ambulatory Visit (INDEPENDENT_AMBULATORY_CARE_PROVIDER_SITE_OTHER): Payer: Medicare Other | Admitting: Sports Medicine

## 2021-05-01 ENCOUNTER — Encounter: Payer: Self-pay | Admitting: Family

## 2021-05-01 ENCOUNTER — Other Ambulatory Visit: Payer: Self-pay

## 2021-05-01 ENCOUNTER — Other Ambulatory Visit (HOSPITAL_BASED_OUTPATIENT_CLINIC_OR_DEPARTMENT_OTHER): Payer: Self-pay

## 2021-05-01 VITALS — BP 130/80 | HR 80 | Ht 70.0 in | Wt 220.0 lb

## 2021-05-01 DIAGNOSIS — G44319 Acute post-traumatic headache, not intractable: Secondary | ICD-10-CM | POA: Diagnosis not present

## 2021-05-01 DIAGNOSIS — Z85828 Personal history of other malignant neoplasm of skin: Secondary | ICD-10-CM | POA: Diagnosis not present

## 2021-05-01 DIAGNOSIS — H538 Other visual disturbances: Secondary | ICD-10-CM | POA: Diagnosis not present

## 2021-05-01 DIAGNOSIS — S060X1D Concussion with loss of consciousness of 30 minutes or less, subsequent encounter: Secondary | ICD-10-CM | POA: Diagnosis not present

## 2021-05-01 DIAGNOSIS — L570 Actinic keratosis: Secondary | ICD-10-CM | POA: Diagnosis not present

## 2021-05-01 DIAGNOSIS — G4700 Insomnia, unspecified: Secondary | ICD-10-CM

## 2021-05-01 DIAGNOSIS — C44619 Basal cell carcinoma of skin of left upper limb, including shoulder: Secondary | ICD-10-CM | POA: Diagnosis not present

## 2021-05-01 DIAGNOSIS — D045 Carcinoma in situ of skin of trunk: Secondary | ICD-10-CM | POA: Diagnosis not present

## 2021-05-01 DIAGNOSIS — R270 Ataxia, unspecified: Secondary | ICD-10-CM

## 2021-05-01 MED ORDER — TRAZODONE HCL 100 MG PO TABS
100.0000 mg | ORAL_TABLET | Freq: Every day | ORAL | 0 refills | Status: DC
  Filled 2021-05-01: qty 30, 30d supply, fill #0

## 2021-05-01 NOTE — Patient Instructions (Addendum)
Good to see you 200 mg trazodone nightly  Continue 5 mg melatonin Continue vestibula therapy 2 week follow up

## 2021-05-02 DIAGNOSIS — M25572 Pain in left ankle and joints of left foot: Secondary | ICD-10-CM | POA: Diagnosis not present

## 2021-05-07 DIAGNOSIS — M25572 Pain in left ankle and joints of left foot: Secondary | ICD-10-CM | POA: Diagnosis not present

## 2021-05-08 DIAGNOSIS — G4733 Obstructive sleep apnea (adult) (pediatric): Secondary | ICD-10-CM | POA: Diagnosis not present

## 2021-05-09 DIAGNOSIS — G4486 Cervicogenic headache: Secondary | ICD-10-CM | POA: Diagnosis not present

## 2021-05-09 DIAGNOSIS — H538 Other visual disturbances: Secondary | ICD-10-CM | POA: Diagnosis not present

## 2021-05-09 DIAGNOSIS — R270 Ataxia, unspecified: Secondary | ICD-10-CM | POA: Diagnosis not present

## 2021-05-09 DIAGNOSIS — G44319 Acute post-traumatic headache, not intractable: Secondary | ICD-10-CM | POA: Diagnosis not present

## 2021-05-12 ENCOUNTER — Encounter: Payer: Self-pay | Admitting: Family

## 2021-05-14 ENCOUNTER — Encounter: Payer: Self-pay | Admitting: Family

## 2021-05-14 DIAGNOSIS — R270 Ataxia, unspecified: Secondary | ICD-10-CM | POA: Diagnosis not present

## 2021-05-14 DIAGNOSIS — G44319 Acute post-traumatic headache, not intractable: Secondary | ICD-10-CM | POA: Diagnosis not present

## 2021-05-14 DIAGNOSIS — G4489 Other headache syndrome: Secondary | ICD-10-CM | POA: Diagnosis not present

## 2021-05-14 DIAGNOSIS — H538 Other visual disturbances: Secondary | ICD-10-CM | POA: Diagnosis not present
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
## 2021-05-15 ENCOUNTER — Other Ambulatory Visit (HOSPITAL_BASED_OUTPATIENT_CLINIC_OR_DEPARTMENT_OTHER): Payer: Self-pay

## 2021-05-15 ENCOUNTER — Ambulatory Visit (INDEPENDENT_AMBULATORY_CARE_PROVIDER_SITE_OTHER): Payer: Medicare Other | Admitting: Sports Medicine

## 2021-05-15 ENCOUNTER — Other Ambulatory Visit: Payer: Self-pay

## 2021-05-15 VITALS — BP 120/90 | HR 71 | Ht 70.0 in

## 2021-05-15 DIAGNOSIS — G44319 Acute post-traumatic headache, not intractable: Secondary | ICD-10-CM

## 2021-05-15 DIAGNOSIS — R270 Ataxia, unspecified: Secondary | ICD-10-CM | POA: Diagnosis not present

## 2021-05-15 DIAGNOSIS — H538 Other visual disturbances: Secondary | ICD-10-CM | POA: Diagnosis not present

## 2021-05-15 DIAGNOSIS — S060X1D Concussion with loss of consciousness of 30 minutes or less, subsequent encounter: Secondary | ICD-10-CM

## 2021-05-15 DIAGNOSIS — G4700 Insomnia, unspecified: Secondary | ICD-10-CM

## 2021-05-15 MED ORDER — AMITRIPTYLINE HCL 10 MG PO TABS
10.0000 mg | ORAL_TABLET | Freq: Every day | ORAL | 0 refills | Status: DC
  Filled 2021-05-15: qty 14, 14d supply, fill #0

## 2021-05-15 NOTE — Patient Instructions (Signed)
Good to see you  Amitriptyline 10 mg nightly  Decrease trazodone 100 mg nightly   2 week follow up

## 2021-05-16 DIAGNOSIS — M25572 Pain in left ankle and joints of left foot: Secondary | ICD-10-CM | POA: Diagnosis not present

## 2021-05-17 ENCOUNTER — Other Ambulatory Visit (HOSPITAL_BASED_OUTPATIENT_CLINIC_OR_DEPARTMENT_OTHER): Payer: Self-pay

## 2021-05-21 DIAGNOSIS — M25572 Pain in left ankle and joints of left foot: Secondary | ICD-10-CM | POA: Diagnosis not present

## 2021-05-23 DIAGNOSIS — H5203 Hypermetropia, bilateral: Secondary | ICD-10-CM | POA: Diagnosis not present

## 2021-05-23 DIAGNOSIS — G44319 Acute post-traumatic headache, not intractable: Secondary | ICD-10-CM | POA: Diagnosis not present

## 2021-05-23 DIAGNOSIS — H25013 Cortical age-related cataract, bilateral: Secondary | ICD-10-CM | POA: Diagnosis not present

## 2021-05-23 DIAGNOSIS — H538 Other visual disturbances: Secondary | ICD-10-CM | POA: Diagnosis not present

## 2021-05-23 DIAGNOSIS — G4486 Cervicogenic headache: Secondary | ICD-10-CM | POA: Diagnosis not present

## 2021-05-23 DIAGNOSIS — E1136 Type 2 diabetes mellitus with diabetic cataract: Secondary | ICD-10-CM | POA: Diagnosis not present

## 2021-05-23 DIAGNOSIS — R270 Ataxia, unspecified: Secondary | ICD-10-CM | POA: Diagnosis not present

## 2021-05-23 DIAGNOSIS — H524 Presbyopia: Secondary | ICD-10-CM | POA: Diagnosis not present

## 2021-05-28 ENCOUNTER — Other Ambulatory Visit: Payer: Medicare Other

## 2021-05-28 ENCOUNTER — Ambulatory Visit: Payer: Medicare Other | Admitting: Hematology & Oncology

## 2021-05-28 DIAGNOSIS — M25572 Pain in left ankle and joints of left foot: Secondary | ICD-10-CM | POA: Diagnosis not present
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
## 2021-05-29 ENCOUNTER — Ambulatory Visit: Payer: Medicare Other | Admitting: Sports Medicine

## 2021-05-29 ENCOUNTER — Other Ambulatory Visit: Payer: Self-pay

## 2021-05-29 ENCOUNTER — Other Ambulatory Visit (HOSPITAL_BASED_OUTPATIENT_CLINIC_OR_DEPARTMENT_OTHER): Payer: Self-pay

## 2021-05-29 VITALS — BP 120/72 | HR 68 | Ht 70.0 in | Wt 223.0 lb

## 2021-05-29 DIAGNOSIS — G44319 Acute post-traumatic headache, not intractable: Secondary | ICD-10-CM

## 2021-05-29 DIAGNOSIS — S060X1D Concussion with loss of consciousness of 30 minutes or less, subsequent encounter: Secondary | ICD-10-CM

## 2021-05-29 DIAGNOSIS — R270 Ataxia, unspecified: Secondary | ICD-10-CM

## 2021-05-29 DIAGNOSIS — H538 Other visual disturbances: Secondary | ICD-10-CM

## 2021-05-29 DIAGNOSIS — G4700 Insomnia, unspecified: Secondary | ICD-10-CM

## 2021-05-29 MED ORDER — TRAZODONE HCL 50 MG PO TABS
50.0000 mg | ORAL_TABLET | Freq: Every day | ORAL | 0 refills | Status: DC
  Filled 2021-05-29: qty 15, 15d supply, fill #0

## 2021-05-29 NOTE — Patient Instructions (Addendum)
Good to see you  Decrease trazodone 50 mg at night  Start amitriptyline 10 mg nightly  2 week follow up

## 2021-05-30 DIAGNOSIS — R270 Ataxia, unspecified: Secondary | ICD-10-CM | POA: Diagnosis not present

## 2021-05-30 DIAGNOSIS — G4486 Cervicogenic headache: Secondary | ICD-10-CM | POA: Diagnosis not present

## 2021-05-30 DIAGNOSIS — H538 Other visual disturbances: Secondary | ICD-10-CM | POA: Diagnosis not present

## 2021-05-30 DIAGNOSIS — G44319 Acute post-traumatic headache, not intractable: Secondary | ICD-10-CM | POA: Diagnosis not present

## 2021-06-02 NOTE — Patient Instructions (Signed)
It was a pleasure to see you today.  Continue current medications and follow-up in 1 year.

## 2021-06-03 ENCOUNTER — Encounter: Payer: Self-pay | Admitting: Family

## 2021-06-03 ENCOUNTER — Other Ambulatory Visit (HOSPITAL_BASED_OUTPATIENT_CLINIC_OR_DEPARTMENT_OTHER): Payer: Self-pay

## 2021-06-03 DIAGNOSIS — M25572 Pain in left ankle and joints of left foot: Secondary | ICD-10-CM | POA: Diagnosis not present

## 2021-06-03 MED FILL — Finasteride Tab 5 MG: ORAL | 90 days supply | Qty: 90 | Fill #3 | Status: AC

## 2021-06-04 ENCOUNTER — Other Ambulatory Visit: Payer: Self-pay

## 2021-06-04 ENCOUNTER — Other Ambulatory Visit (HOSPITAL_BASED_OUTPATIENT_CLINIC_OR_DEPARTMENT_OTHER): Payer: Self-pay

## 2021-06-04 ENCOUNTER — Telehealth: Payer: Self-pay | Admitting: *Deleted

## 2021-06-04 ENCOUNTER — Inpatient Hospital Stay: Payer: Medicare Other | Attending: Hematology & Oncology

## 2021-06-04 ENCOUNTER — Encounter: Payer: Self-pay | Admitting: Hematology & Oncology

## 2021-06-04 ENCOUNTER — Inpatient Hospital Stay: Payer: Medicare Other | Admitting: Hematology & Oncology

## 2021-06-04 ENCOUNTER — Inpatient Hospital Stay: Payer: Medicare Other

## 2021-06-04 VITALS — BP 115/62 | HR 59 | Temp 98.9°F | Resp 18 | Ht 70.0 in | Wt 224.8 lb

## 2021-06-04 DIAGNOSIS — D508 Other iron deficiency anemias: Secondary | ICD-10-CM

## 2021-06-04 DIAGNOSIS — Z85528 Personal history of other malignant neoplasm of kidney: Secondary | ICD-10-CM | POA: Insufficient documentation

## 2021-06-04 DIAGNOSIS — C651 Malignant neoplasm of right renal pelvis: Secondary | ICD-10-CM

## 2021-06-04 DIAGNOSIS — C670 Malignant neoplasm of trigone of bladder: Secondary | ICD-10-CM

## 2021-06-04 DIAGNOSIS — Z9221 Personal history of antineoplastic chemotherapy: Secondary | ICD-10-CM | POA: Insufficient documentation

## 2021-06-04 DIAGNOSIS — Z7982 Long term (current) use of aspirin: Secondary | ICD-10-CM | POA: Insufficient documentation

## 2021-06-04 DIAGNOSIS — R5383 Other fatigue: Secondary | ICD-10-CM

## 2021-06-04 DIAGNOSIS — Z7984 Long term (current) use of oral hypoglycemic drugs: Secondary | ICD-10-CM | POA: Insufficient documentation

## 2021-06-04 DIAGNOSIS — E119 Type 2 diabetes mellitus without complications: Secondary | ICD-10-CM | POA: Insufficient documentation

## 2021-06-04 DIAGNOSIS — Z79899 Other long term (current) drug therapy: Secondary | ICD-10-CM | POA: Diagnosis not present

## 2021-06-04 DIAGNOSIS — Z905 Acquired absence of kidney: Secondary | ICD-10-CM | POA: Insufficient documentation

## 2021-06-04 DIAGNOSIS — C649 Malignant neoplasm of unspecified kidney, except renal pelvis: Secondary | ICD-10-CM | POA: Diagnosis not present

## 2021-06-04 DIAGNOSIS — Z95828 Presence of other vascular implants and grafts: Secondary | ICD-10-CM

## 2021-06-04 DIAGNOSIS — F419 Anxiety disorder, unspecified: Secondary | ICD-10-CM | POA: Diagnosis not present

## 2021-06-04 LAB — CMP (CANCER CENTER ONLY)
ALT: 50 U/L — ABNORMAL HIGH (ref 0–44)
AST: 28 U/L (ref 15–41)
Albumin: 4.2 g/dL (ref 3.5–5.0)
Alkaline Phosphatase: 47 U/L (ref 38–126)
Anion gap: 8 (ref 5–15)
BUN: 21 mg/dL (ref 8–23)
CO2: 25 mmol/L (ref 22–32)
Calcium: 9 mg/dL (ref 8.9–10.3)
Chloride: 100 mmol/L (ref 98–111)
Creatinine: 1.82 mg/dL — ABNORMAL HIGH (ref 0.61–1.24)
GFR, Estimated: 41 mL/min — ABNORMAL LOW (ref >60)
Glucose, Bld: 254 mg/dL — ABNORMAL HIGH (ref 70–99)
Potassium: 4.3 mmol/L (ref 3.5–5.1)
Sodium: 133 mmol/L — ABNORMAL LOW (ref 135–145)
Total Bilirubin: 0.6 mg/dL (ref 0.3–1.2)
Total Protein: 6.2 g/dL — ABNORMAL LOW (ref 6.5–8.1)

## 2021-06-04 LAB — IRON AND IRON BINDING CAPACITY (CC-WL,HP ONLY)
Iron: 129 ug/dL (ref 45–182)
Saturation Ratios: 39 % (ref 17.9–39.5)
TIBC: 329 ug/dL (ref 250–450)
UIBC: 200 ug/dL (ref 117–376)

## 2021-06-04 LAB — LACTATE DEHYDROGENASE: LDH: 134 U/L (ref 98–192)

## 2021-06-04 LAB — RETICULOCYTES
Immature Retic Fract: 12.3 % (ref 2.3–15.9)
RBC.: 4.09 MIL/uL — ABNORMAL LOW (ref 4.22–5.81)
Retic Count, Absolute: 81 10*3/uL (ref 19.0–186.0)
Retic Ct Pct: 2 % (ref 0.4–3.1)

## 2021-06-04 LAB — CBC WITH DIFFERENTIAL (CANCER CENTER ONLY)
Abs Immature Granulocytes: 0.02 10*3/uL (ref 0.00–0.07)
Basophils Absolute: 0 10*3/uL (ref 0.0–0.1)
Basophils Relative: 1 %
Eosinophils Absolute: 0.1 10*3/uL (ref 0.0–0.5)
Eosinophils Relative: 3 %
HCT: 39.2 % (ref 39.0–52.0)
Hemoglobin: 13.4 g/dL (ref 13.0–17.0)
Immature Granulocytes: 1 %
Lymphocytes Relative: 31 %
Lymphs Abs: 1.1 10*3/uL (ref 0.7–4.0)
MCH: 33 pg (ref 26.0–34.0)
MCHC: 34.2 g/dL (ref 30.0–36.0)
MCV: 96.6 fL (ref 80.0–100.0)
Monocytes Absolute: 0.3 10*3/uL (ref 0.1–1.0)
Monocytes Relative: 9 %
Neutro Abs: 1.9 10*3/uL (ref 1.7–7.7)
Neutrophils Relative %: 55 %
Platelet Count: 90 10*3/uL — ABNORMAL LOW (ref 150–400)
RBC: 4.06 MIL/uL — ABNORMAL LOW (ref 4.22–5.81)
RDW: 14.3 % (ref 11.5–15.5)
WBC Count: 3.3 10*3/uL — ABNORMAL LOW (ref 4.0–10.5)
nRBC: 0 % (ref 0.0–0.2)

## 2021-06-04 MED ORDER — ALPRAZOLAM 1 MG PO TABS
1.0000 mg | ORAL_TABLET | Freq: Three times a day (TID) | ORAL | 0 refills | Status: DC | PRN
  Filled 2021-06-04: qty 90, 30d supply, fill #0

## 2021-06-04 MED ORDER — SODIUM CHLORIDE 0.9% FLUSH
10.0000 mL | INTRAVENOUS | Status: AC | PRN
  Administered 2021-06-04: 12:00:00 10 mL via INTRAVENOUS

## 2021-06-04 MED ORDER — HEPARIN SOD (PORK) LOCK FLUSH 100 UNIT/ML IV SOLN
500.0000 [IU] | Freq: Once | INTRAVENOUS | Status: AC
  Administered 2021-06-04: 12:00:00 500 [IU] via INTRAVENOUS

## 2021-06-04 NOTE — Patient Instructions (Signed)

## 2021-06-04 NOTE — Addendum Note (Signed)
Addended by: [place] on: 06/04/2021 11:54 AM   Modules accepted: Orders

## 2021-06-04 NOTE — Telephone Encounter (Signed)
Per 06/04/21 los gave upcoming appointments - confirmed
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
## 2021-06-05 LAB — FERRITIN: Ferritin: 230 ng/mL (ref 24–336)

## 2021-06-06 DIAGNOSIS — G44319 Acute post-traumatic headache, not intractable: Secondary | ICD-10-CM | POA: Diagnosis not present

## 2021-06-06 DIAGNOSIS — R270 Ataxia, unspecified: Secondary | ICD-10-CM | POA: Diagnosis not present

## 2021-06-06 DIAGNOSIS — H538 Other visual disturbances: Secondary | ICD-10-CM | POA: Diagnosis not present

## 2021-06-06 DIAGNOSIS — G4486 Cervicogenic headache: Secondary | ICD-10-CM | POA: Diagnosis not present

## 2021-06-06 DIAGNOSIS — M25572 Pain in left ankle and joints of left foot: Secondary | ICD-10-CM | POA: Diagnosis not present

## 2021-06-11 DIAGNOSIS — M25572 Pain in left ankle and joints of left foot: Secondary | ICD-10-CM | POA: Diagnosis not present
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
## 2021-06-12 ENCOUNTER — Other Ambulatory Visit: Payer: Self-pay

## 2021-06-12 ENCOUNTER — Ambulatory Visit: Payer: Medicare Other | Admitting: Sports Medicine

## 2021-06-12 ENCOUNTER — Other Ambulatory Visit (HOSPITAL_BASED_OUTPATIENT_CLINIC_OR_DEPARTMENT_OTHER): Payer: Self-pay

## 2021-06-12 VITALS — BP 132/80 | HR 95 | Ht 70.0 in | Wt 225.0 lb

## 2021-06-12 DIAGNOSIS — H538 Other visual disturbances: Secondary | ICD-10-CM | POA: Diagnosis not present

## 2021-06-12 DIAGNOSIS — G44319 Acute post-traumatic headache, not intractable: Secondary | ICD-10-CM | POA: Diagnosis not present

## 2021-06-12 DIAGNOSIS — R270 Ataxia, unspecified: Secondary | ICD-10-CM | POA: Diagnosis not present

## 2021-06-12 DIAGNOSIS — S060X1D Concussion with loss of consciousness of 30 minutes or less, subsequent encounter: Secondary | ICD-10-CM | POA: Diagnosis not present

## 2021-06-12 DIAGNOSIS — G4700 Insomnia, unspecified: Secondary | ICD-10-CM

## 2021-06-12 MED ORDER — AMITRIPTYLINE HCL 25 MG PO TABS
25.0000 mg | ORAL_TABLET | Freq: Every day | ORAL | 0 refills | Status: DC
  Filled 2021-06-12 – 2021-06-17 (×2): qty 14, 14d supply, fill #0

## 2021-06-12 MED ORDER — TRAZODONE HCL 50 MG PO TABS
50.0000 mg | ORAL_TABLET | Freq: Every evening | ORAL | 0 refills | Status: DC | PRN
  Filled 2021-06-12: qty 30, 30d supply, fill #0

## 2021-06-12 NOTE — Patient Instructions (Addendum)
Good to see you  Increase amitriptyline 25 mg nightly  Continue trazodone 50 mg nightly  Continue vestibular therapy  Continue wearing new glasses  2 week follow up

## 2021-06-13 DIAGNOSIS — G4486 Cervicogenic headache: Secondary | ICD-10-CM | POA: Diagnosis not present

## 2021-06-13 DIAGNOSIS — H538 Other visual disturbances: Secondary | ICD-10-CM | POA: Diagnosis not present

## 2021-06-13 DIAGNOSIS — R270 Ataxia, unspecified: Secondary | ICD-10-CM | POA: Diagnosis not present

## 2021-06-13 DIAGNOSIS — G44319 Acute post-traumatic headache, not intractable: Secondary | ICD-10-CM | POA: Diagnosis not present

## 2021-06-17 ENCOUNTER — Other Ambulatory Visit (HOSPITAL_BASED_OUTPATIENT_CLINIC_OR_DEPARTMENT_OTHER): Payer: Self-pay

## 2021-06-18 DIAGNOSIS — M25572 Pain in left ankle and joints of left foot: Secondary | ICD-10-CM | POA: Diagnosis not present
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
## 2021-06-20 DIAGNOSIS — R270 Ataxia, unspecified: Secondary | ICD-10-CM | POA: Diagnosis not present

## 2021-06-20 DIAGNOSIS — G44319 Acute post-traumatic headache, not intractable: Secondary | ICD-10-CM | POA: Diagnosis not present

## 2021-06-20 DIAGNOSIS — H538 Other visual disturbances: Secondary | ICD-10-CM | POA: Diagnosis not present

## 2021-06-20 DIAGNOSIS — G4486 Cervicogenic headache: Secondary | ICD-10-CM | POA: Diagnosis not present

## 2021-06-25 DIAGNOSIS — M25572 Pain in left ankle and joints of left foot: Secondary | ICD-10-CM | POA: Diagnosis not present

## 2021-06-26 ENCOUNTER — Other Ambulatory Visit (HOSPITAL_BASED_OUTPATIENT_CLINIC_OR_DEPARTMENT_OTHER): Payer: Self-pay

## 2021-06-26 ENCOUNTER — Ambulatory Visit: Payer: Medicare Other | Admitting: Sports Medicine

## 2021-06-26 ENCOUNTER — Other Ambulatory Visit: Payer: Self-pay

## 2021-06-26 VITALS — BP 132/80 | HR 64 | Ht 70.0 in | Wt 227.0 lb

## 2021-06-26 DIAGNOSIS — S060X1D Concussion with loss of consciousness of 30 minutes or less, subsequent encounter: Secondary | ICD-10-CM | POA: Diagnosis not present

## 2021-06-26 DIAGNOSIS — R270 Ataxia, unspecified: Secondary | ICD-10-CM | POA: Diagnosis not present

## 2021-06-26 DIAGNOSIS — G44319 Acute post-traumatic headache, not intractable: Secondary | ICD-10-CM

## 2021-06-26 DIAGNOSIS — H538 Other visual disturbances: Secondary | ICD-10-CM | POA: Diagnosis not present

## 2021-06-26 DIAGNOSIS — G4700 Insomnia, unspecified: Secondary | ICD-10-CM

## 2021-06-26 MED ORDER — TRAZODONE HCL 50 MG PO TABS
50.0000 mg | ORAL_TABLET | Freq: Every day | ORAL | 0 refills | Status: DC
  Filled 2021-06-26 – 2021-07-05 (×3): qty 30, 30d supply, fill #0

## 2021-06-26 MED ORDER — AMITRIPTYLINE HCL 25 MG PO TABS
25.0000 mg | ORAL_TABLET | Freq: Every day | ORAL | 0 refills | Status: DC
  Filled 2021-06-26 – 2021-07-01 (×2): qty 30, 30d supply, fill #0

## 2021-06-26 NOTE — Patient Instructions (Addendum)
Good to see you  Continue trazodone  Continue amitriptyline 2 week follow up

## 2021-06-27 DIAGNOSIS — H538 Other visual disturbances: Secondary | ICD-10-CM | POA: Diagnosis not present

## 2021-06-27 DIAGNOSIS — R270 Ataxia, unspecified: Secondary | ICD-10-CM | POA: Diagnosis not present

## 2021-06-27 DIAGNOSIS — G44319 Acute post-traumatic headache, not intractable: Secondary | ICD-10-CM | POA: Diagnosis not present

## 2021-06-27 DIAGNOSIS — G4486 Cervicogenic headache: Secondary | ICD-10-CM | POA: Diagnosis not present

## 2021-07-01 ENCOUNTER — Other Ambulatory Visit (HOSPITAL_BASED_OUTPATIENT_CLINIC_OR_DEPARTMENT_OTHER): Payer: Self-pay

## 2021-07-02 DIAGNOSIS — M25572 Pain in left ankle and joints of left foot: Secondary | ICD-10-CM | POA: Diagnosis not present

## 2021-07-04 DIAGNOSIS — G44319 Acute post-traumatic headache, not intractable: Secondary | ICD-10-CM | POA: Diagnosis not present

## 2021-07-04 DIAGNOSIS — R270 Ataxia, unspecified: Secondary | ICD-10-CM | POA: Diagnosis not present

## 2021-07-04 DIAGNOSIS — G4486 Cervicogenic headache: Secondary | ICD-10-CM | POA: Diagnosis not present

## 2021-07-04 DIAGNOSIS — H538 Other visual disturbances: Secondary | ICD-10-CM | POA: Diagnosis not present

## 2021-07-05 ENCOUNTER — Other Ambulatory Visit (HOSPITAL_BASED_OUTPATIENT_CLINIC_OR_DEPARTMENT_OTHER): Payer: Self-pay

## 2021-07-08 ENCOUNTER — Other Ambulatory Visit: Payer: Self-pay | Admitting: *Deleted

## 2021-07-08 DIAGNOSIS — E1142 Type 2 diabetes mellitus with diabetic polyneuropathy: Secondary | ICD-10-CM | POA: Diagnosis not present

## 2021-07-08 DIAGNOSIS — G620 Drug-induced polyneuropathy: Secondary | ICD-10-CM | POA: Diagnosis not present

## 2021-07-08 DIAGNOSIS — B351 Tinea unguium: Secondary | ICD-10-CM | POA: Diagnosis not present

## 2021-07-08 DIAGNOSIS — S93422A Sprain of deltoid ligament of left ankle, initial encounter: Secondary | ICD-10-CM | POA: Diagnosis not present

## 2021-07-08 MED ORDER — METOPROLOL SUCCINATE ER 25 MG PO TB24: ORAL_TABLET | ORAL | 0 refills | Status: DC

## 2021-07-09 DIAGNOSIS — M25572 Pain in left ankle and joints of left foot: Secondary | ICD-10-CM | POA: Diagnosis not present
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
## 2021-07-10 ENCOUNTER — Ambulatory Visit (INDEPENDENT_AMBULATORY_CARE_PROVIDER_SITE_OTHER): Payer: Medicare Other | Admitting: Sports Medicine

## 2021-07-10 ENCOUNTER — Other Ambulatory Visit: Payer: Self-pay

## 2021-07-10 VITALS — BP 132/72 | HR 82 | Ht 70.0 in | Wt 227.0 lb

## 2021-07-10 DIAGNOSIS — G4700 Insomnia, unspecified: Secondary | ICD-10-CM | POA: Diagnosis not present

## 2021-07-10 DIAGNOSIS — S060X1D Concussion with loss of consciousness of 30 minutes or less, subsequent encounter: Secondary | ICD-10-CM | POA: Diagnosis not present

## 2021-07-10 DIAGNOSIS — R270 Ataxia, unspecified: Secondary | ICD-10-CM

## 2021-07-10 DIAGNOSIS — G44319 Acute post-traumatic headache, not intractable: Secondary | ICD-10-CM

## 2021-07-10 NOTE — Patient Instructions (Addendum)
Good to see you  ?Continue trazodone 50 mg daily  ?Continue amitriptyline 25 mg daily  ?2 week follow up  ?

## 2021-07-11 DIAGNOSIS — H538 Other visual disturbances: Secondary | ICD-10-CM | POA: Diagnosis not present

## 2021-07-11 DIAGNOSIS — R270 Ataxia, unspecified: Secondary | ICD-10-CM | POA: Diagnosis not present

## 2021-07-11 DIAGNOSIS — G44319 Acute post-traumatic headache, not intractable: Secondary | ICD-10-CM | POA: Diagnosis not present

## 2021-07-15 DIAGNOSIS — M25572 Pain in left ankle and joints of left foot: Secondary | ICD-10-CM | POA: Diagnosis not present

## 2021-07-16 DIAGNOSIS — R270 Ataxia, unspecified: Secondary | ICD-10-CM | POA: Diagnosis not present

## 2021-07-16 DIAGNOSIS — H538 Other visual disturbances: Secondary | ICD-10-CM | POA: Diagnosis not present

## 2021-07-16 DIAGNOSIS — G44319 Acute post-traumatic headache, not intractable: Secondary | ICD-10-CM | POA: Diagnosis not present

## 2021-07-17 DIAGNOSIS — M79672 Pain in left foot: Secondary | ICD-10-CM | POA: Diagnosis not present

## 2021-07-22 ENCOUNTER — Telehealth: Payer: Self-pay | Admitting: Internal Medicine

## 2021-07-22 DIAGNOSIS — H538 Other visual disturbances: Secondary | ICD-10-CM | POA: Diagnosis not present

## 2021-07-22 DIAGNOSIS — G44319 Acute post-traumatic headache, not intractable: Secondary | ICD-10-CM | POA: Diagnosis not present

## 2021-07-22 DIAGNOSIS — G4486 Cervicogenic headache: Secondary | ICD-10-CM | POA: Diagnosis not present

## 2021-07-22 DIAGNOSIS — R270 Ataxia, unspecified: Secondary | ICD-10-CM | POA: Diagnosis not present

## 2021-07-22 NOTE — Telephone Encounter (Signed)
This message was sent via [place], a product from [employer] [email or web address]://[email or web address]/ ? ?                  -------Fax Transmission Report------- ? ?To:               Recipient at [phone] ?Subject:          FW: Hp Scans ?Result:           The transmission was successful. ?Explanation:      All Pages Ok ?Pages Sent:       4 ?Connect Time:     1 minutes, 56 seconds ?Transmit Time:    07/22/2021 16:55 ?Transfer Rate:    14400 ?Status Code:      0000 ?Retry Count:      3 ?Job Id:           [id number] ?Unique Id:        [id number] ?Fax Line:         10 ?Fax Server:       [id number] ? ? ?

## 2021-07-22 NOTE — Telephone Encounter (Signed)
[facility] ?Phone [phone] ?Fax [phone] ? ?Received paperwork that stated it needed to be filled out by doctor that is treating patient for diabetes, so I left message for clinical staff and I also faxed message back that this paperwork should be filled out by Dr Rodena Goldmann office [place] endocrinologist. If they have any questions to please call. ? ?

## 2021-07-23 DIAGNOSIS — Z794 Long term (current) use of insulin: Secondary | ICD-10-CM | POA: Diagnosis not present

## 2021-07-23 DIAGNOSIS — N1832 Chronic kidney disease, stage 3b: Secondary | ICD-10-CM | POA: Diagnosis not present

## 2021-07-23 DIAGNOSIS — E119 Type 2 diabetes mellitus without complications: Secondary | ICD-10-CM | POA: Diagnosis not present
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
## 2021-07-24 ENCOUNTER — Other Ambulatory Visit: Payer: Self-pay

## 2021-07-24 ENCOUNTER — Ambulatory Visit: Payer: Medicare Other | Admitting: Sports Medicine

## 2021-07-24 ENCOUNTER — Telehealth: Payer: Self-pay | Admitting: *Deleted

## 2021-07-24 VITALS — BP 118/78 | HR 80 | Ht 70.0 in | Wt 221.0 lb

## 2021-07-24 DIAGNOSIS — R270 Ataxia, unspecified: Secondary | ICD-10-CM | POA: Diagnosis not present

## 2021-07-24 DIAGNOSIS — G4700 Insomnia, unspecified: Secondary | ICD-10-CM

## 2021-07-24 DIAGNOSIS — G44319 Acute post-traumatic headache, not intractable: Secondary | ICD-10-CM

## 2021-07-24 DIAGNOSIS — S060X1D Concussion with loss of consciousness of 30 minutes or less, subsequent encounter: Secondary | ICD-10-CM

## 2021-07-24 NOTE — Telephone Encounter (Signed)
Pt was called and was told that he did not need to continue VT  ?

## 2021-07-24 NOTE — Patient Instructions (Addendum)
Good to see you  ? ?Recommend stopping trazodone today and continuing amitriptyline 25 mg daily (07/24/2021) ? ?If no side effects and tolerating this change well after 1 week, recommend taking amitriptyline every other day for 1 week (07/31/2021 to 08/07/2021) ? ?If no side effects and tolerating this change well after 08/07/2021, discontinue amitriptyline ? ?Follow up in 3 weeks ?

## 2021-07-24 NOTE — Telephone Encounter (Signed)
Pt called stating Dr. Glennon Mac wanted him to f/u in 3 weeks. Does he want him to continue therapy during that time? ?Callback # [phone] ?

## 2021-07-26 ENCOUNTER — Other Ambulatory Visit: Payer: Self-pay

## 2021-07-30 ENCOUNTER — Encounter: Payer: Self-pay | Admitting: Family

## 2021-07-30 ENCOUNTER — Other Ambulatory Visit (HOSPITAL_BASED_OUTPATIENT_CLINIC_OR_DEPARTMENT_OTHER): Payer: Self-pay

## 2021-07-30 DIAGNOSIS — N5201 Erectile dysfunction due to arterial insufficiency: Secondary | ICD-10-CM | POA: Diagnosis not present

## 2021-07-30 DIAGNOSIS — C678 Malignant neoplasm of overlapping sites of bladder: Secondary | ICD-10-CM | POA: Diagnosis not present

## 2021-07-30 MED ORDER — TADALAFIL 20 MG PO TABS
ORAL_TABLET | ORAL | 3 refills | Status: DC
  Filled 2021-07-30: qty 30, 30d supply, fill #0
  Filled 2022-01-14: qty 30, 30d supply, fill #1

## 2021-07-31 ENCOUNTER — Other Ambulatory Visit (HOSPITAL_BASED_OUTPATIENT_CLINIC_OR_DEPARTMENT_OTHER): Payer: Self-pay

## 2021-08-02 ENCOUNTER — Inpatient Hospital Stay: Payer: Medicare Other | Attending: Hematology & Oncology

## 2021-08-02 ENCOUNTER — Other Ambulatory Visit: Payer: Self-pay

## 2021-08-02 ENCOUNTER — Other Ambulatory Visit (HOSPITAL_BASED_OUTPATIENT_CLINIC_OR_DEPARTMENT_OTHER): Payer: Self-pay

## 2021-08-02 DIAGNOSIS — Z452 Encounter for adjustment and management of vascular access device: Secondary | ICD-10-CM | POA: Insufficient documentation

## 2021-08-02 DIAGNOSIS — Z95828 Presence of other vascular implants and grafts: Secondary | ICD-10-CM

## 2021-08-02 DIAGNOSIS — Z85028 Personal history of other malignant neoplasm of stomach: Secondary | ICD-10-CM | POA: Diagnosis not present

## 2021-08-02 DIAGNOSIS — Z85528 Personal history of other malignant neoplasm of kidney: Secondary | ICD-10-CM | POA: Diagnosis present

## 2021-08-02 MED ORDER — SODIUM CHLORIDE 0.9% FLUSH
10.0000 mL | Freq: Once | INTRAVENOUS | Status: AC
  Administered 2021-08-02: 10 mL via INTRAVENOUS

## 2021-08-02 MED ORDER — AMOXICILLIN 500 MG PO CAPS
2000.0000 mg | ORAL_CAPSULE | Freq: Once | ORAL | 0 refills | Status: AC
  Filled 2021-08-02: qty 8, 2d supply, fill #0

## 2021-08-02 MED ORDER — HEPARIN SOD (PORK) LOCK FLUSH 100 UNIT/ML IV SOLN
500.0000 [IU] | Freq: Once | INTRAVENOUS | Status: AC
  Administered 2021-08-02: 500 [IU] via INTRAVENOUS

## 2021-08-02 NOTE — Patient Instructions (Signed)
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
## 2021-08-14 ENCOUNTER — Ambulatory Visit: Payer: Medicare Other | Admitting: Sports Medicine

## 2021-08-14 VITALS — BP 132/78 | HR 73 | Ht 70.0 in | Wt 224.0 lb

## 2021-08-14 DIAGNOSIS — S060X1D Concussion with loss of consciousness of 30 minutes or less, subsequent encounter: Secondary | ICD-10-CM | POA: Diagnosis not present

## 2021-08-14 DIAGNOSIS — G4700 Insomnia, unspecified: Secondary | ICD-10-CM | POA: Diagnosis not present

## 2021-08-14 DIAGNOSIS — R270 Ataxia, unspecified: Secondary | ICD-10-CM | POA: Diagnosis not present

## 2021-08-14 NOTE — Patient Instructions (Addendum)
Good to see you  ?As needed follow up  ?

## 2021-08-20 ENCOUNTER — Telehealth: Payer: Self-pay | Admitting: *Deleted

## 2021-08-20 ENCOUNTER — Other Ambulatory Visit: Payer: Self-pay | Admitting: *Deleted

## 2021-08-20 MED ORDER — PANTOPRAZOLE SODIUM 40 MG PO TBEC: 40.0000 mg | DELAYED_RELEASE_TABLET | Freq: Every morning | ORAL | 0 refills | Status: DC

## 2021-08-20 MED ORDER — METOPROLOL SUCCINATE ER 25 MG PO TB24: ORAL_TABLET | ORAL | 0 refills | Status: DC

## 2021-08-20 NOTE — Telephone Encounter (Signed)
Primary Cardiologist:Paula Harrington Challenger, MD ? ?Chart reviewed as part of pre-operative protocol coverage. Because of Isaac [name]'s past medical history and time since last visit, he/she will require a follow-up visit in order to better assess preoperative cardiovascular risk. ? ?Pre-op covering staff: ?- Please schedule appointment and call patient to inform them. ?- Please contact requesting surgeon's office via preferred method (i.e, phone, fax) to inform them of need for appointment prior to surgery. ? ?If applicable, this message will also be routed to pharmacy pool and/or primary cardiologist for input on holding anticoagulant/antiplatelet agent as requested below so that this information is available at time of patient's appointment.  ? ?Isaac Life, NP-C ? ?  ?08/20/2021, 1:30 PM ?[facility] ?[address], [address] ?Office [phone] Fax [phone] ? ?

## 2021-08-20 NOTE — Telephone Encounter (Signed)
Pt is agreeable to plan of care for tele pre op appt 08/21/21 @ 3:20. Med rec and consent are done.  ?

## 2021-08-20 NOTE — Telephone Encounter (Signed)
Pt is agreeable to plan of care for tele pre op appt 08/21/21 @ 3:20. Med rec and consent are done.  ? ?  ?Patient Consent for Virtual Visit  ? ? ?   ? ?[name] has provided verbal consent on 08/20/2021 for a virtual visit (video or telephone). ? ? ?CONSENT FOR VIRTUAL VISIT FOR:  Isaac [name]  ?By participating in this virtual visit I agree to the following: ? ?I hereby voluntarily request, consent and authorize [facility] and its employed or contracted physicians, physician assistants, nurse practitioners or other licensed health care professionals (the Practitioner), to provide me with telemedicine health care services (the ?Services") as deemed necessary by the treating Practitioner. I acknowledge and consent to receive the Services by the Practitioner via telemedicine. I understand that the telemedicine visit will involve communicating with the Practitioner through live audiovisual communication technology and the disclosure of certain medical information by electronic transmission. I acknowledge that I have been given the opportunity to request an in-person assessment or other available alternative prior to the telemedicine visit and am voluntarily participating in the telemedicine visit. ? ?I understand that I have the right to withhold or withdraw my consent to the use of telemedicine in the course of my care at any time, without affecting my right to future care or treatment, and that the Practitioner or I may terminate the telemedicine visit at any time. I understand that I have the right to inspect all information obtained and/or recorded in the course of the telemedicine visit and may receive copies of available information for a reasonable fee.  I understand that some of the potential risks of receiving the Services via telemedicine include:  ?Delay or interruption in medical evaluation due to technological equipment failure or disruption; ?Information transmitted may not be  sufficient (e.g. poor resolution of images) to allow for appropriate medical decision making by the Practitioner; and/or  ?In rare instances, security protocols could fail, causing a breach of personal health information. ? ?Furthermore, I acknowledge that it is my responsibility to provide information about my medical history, conditions and care that is complete and accurate to the best of my ability. I acknowledge that Practitioner's advice, recommendations, and/or decision may be based on factors not within their control, such as incomplete or inaccurate data provided by me or distortions of diagnostic images or specimens that may result from electronic transmissions. I understand that the practice of medicine is not an exact science and that Practitioner makes no warranties or guarantees regarding treatment outcomes. I acknowledge that a copy of this consent can be made available to me via my patient portal ([facility]), or I can request a printed copy by calling the office of [facility].   ? ?I understand that my insurance will be billed for this visit.  ? ?I have read or had this consent read to me. ?I understand the contents of this consent, which adequately explains the benefits and risks of the Services being provided via telemedicine.  ?I have been provided ample opportunity to ask questions regarding this consent and the Services and have had my questions answered to my satisfaction. ?I give my informed consent for the services to be provided through the use of telemedicine in my medical care ? ? ? ?

## 2021-08-20 NOTE — Telephone Encounter (Signed)
? ?  Pre-operative Risk Assessment  ?  ?Patient Name: Isaac [name]  ?DOB: [birth year] ?MRN: [id number]  ? ?  ? ?Request for Surgical Clearance   ? ?Procedure:   LEFT FOOT MEDIAL DISPLACEMENT CALCANEAL OSTEOTOMY, POSTERIOR  TIBIAL TENDON RECONSTRUCTION, CALCANEONAVICULAR LIGAMENT RECONSTRUCTION, POSSIBLE FLEXOR DIGITORUM LONGUS TENDON TRANSFER, POSSIBLE MEDIAL COLUMN PLANTARFLEXION OSTEOTOMY  ? ?Date of Surgery:  Clearance 10/01/21                              ?   ?Surgeon:  DR. Radene Journey ?Surgeon's Group or Practice Name:  [facility] ?Phone number:  [phone] ATTN: Lattie Corns ?Fax number:  [phone] ATTN: Lattie Corns ?  ?Type of Clearance Requested:   ?- Medical  ?- Pharmacy:  Hold Aspirin   ?  ?Type of Anesthesia:  General  ?  ?Additional requests/questions:   ? ?Signed, ?Julaine Hua   ?08/20/2021, 9:26 AM  ? ?

## 2021-08-21 ENCOUNTER — Telehealth: Payer: Self-pay | Admitting: *Deleted

## 2021-08-21 ENCOUNTER — Encounter: Payer: Medicare Other | Admitting: Nurse Practitioner

## 2021-08-21 NOTE — Progress Notes (Signed)
Error   This encounter was created in error - please disregard. 

## 2021-08-21 NOTE — Telephone Encounter (Signed)
Left message for the pt to call the office. Tele appt was made in error and should had been scheduled for an IN OFFICE APPT for pre op clearance with Dr. Harrington Challenger or APP.  ?

## 2021-08-21 NOTE — Telephone Encounter (Signed)
Pt called back and has been scheduled for IN OFFICE APPT for pre op clearance.  ?

## 2021-09-02 ENCOUNTER — Encounter: Payer: Self-pay | Admitting: Physician Assistant

## 2021-09-02 DIAGNOSIS — M21621 Bunionette of right foot: Secondary | ICD-10-CM | POA: Diagnosis not present

## 2021-09-02 DIAGNOSIS — E1142 Type 2 diabetes mellitus with diabetic polyneuropathy: Secondary | ICD-10-CM | POA: Diagnosis not present

## 2021-09-02 DIAGNOSIS — M21622 Bunionette of left foot: Secondary | ICD-10-CM | POA: Diagnosis not present

## 2021-09-02 NOTE — Progress Notes (Addendum)
? ?Cardiology Office Note   ? ?Date:  09/03/2021  ? ?ID:  MURAT RIDEOUT, DOB 1956-02-11, MRN [id number] ? ?PCP:  Elby Showers, MD  ?Cardiologist:  [name], MD  ?Electrophysiologist:  None  ? ?Chief Complaint: preoperative evaluation ? ?History of Present Illness:  ? ?Isaac [name] is a 66 y.o. 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 He is pending foot surgery. He also follows with Dr. [name].  ? ?His last cath is reviewed below in 2009 with mild nonbstructive CAD with TIMI-2 flow in most of the coronary beds likely secondary to endothelial dysfunction or microvascular disease with a possible component of vasospasm. Nuclear stress test 12/2020 was normal. Last echo 2018 showed EF 60-65%, mild LVH, G1DD. Lipids are historically followed by primary care.  ? ?He is seen for follow-up back today doing well without any cardiac complaints. He denies any CP, SOB, palpitations or syncope. He suffered a foot injury a few months ago which did not improve with PT, ultimately found to have tendon/bone issues prompting surgery request below. ? ?SURGERY REQUEST: LEFT FOOT MEDIAL DISPLACEMENT CALCANEAL OSTEOTOMY, POSTERIOR  TIBIAL TENDON RECONSTRUCTION, CALCANEONAVICULAR LIGAMENT RECONSTRUCTION, POSSIBLE FLEXOR DIGITORUM LONGUS TENDON TRANSFER, POSSIBLE MEDIAL COLUMN PLANTARFLEXION OSTEOTOMY ? ?Labwork independently reviewed: ?07/2021 A1c 7.1 ?05/2021 Na 133, K 4.3, Cr 1.8, Tprot 6.2, AST 28, ALT 50, Hgb 13.4, plt 90 ?03/2021 (PCP) LDL 98, trig 186 ?12/2020 TSH wnl ? ?Cardiology Studies:  ? ?Studies reviewed are outlined and summarized above. Reports included below if pertinent.  ? ?Nuc 12/2020 ?Lexiscan stress is electrcially negative for ischemia ?Myoview scan shows normal  perfusion No ischemia or scar ?Nuclear stress EF: 54%. ?This is a low risk study. ? ?2D echo 2018 ?- Left ventricle: The cavity size was normal. Wall thickness was  ?  increased in a pattern of mild LVH. Systolic function was normal.  ?  The estimated ejection fraction was in the range of 60% to 65%.  ?  Wall motion was normal; there were no regional wall motion  ?  abnormalities. Doppler parameters are consistent with abnormal  ?  left ventricular relaxation (grade 1 diastolic dysfunction).  ?  GLS:-18.4%  ? ? ?2009 Cardiac Cath ?DATE OF PROCEDURE:  06/17/2007 ? DATE OF DISCHARGE: ?  ?                          CARDIAC CATHETERIZATION ?  ? INDICATIONS:  [name] is a 66 year old male with a history of ? hypertension, diabetes and tobacco use.  He has a history of a non-ST- ? elevation MI thought secondary to an occluded ramus.  He underwent ? cardiac catheterization in March 2007, which showed just minimal ? nonobstructive coronary artery disease with TIMI-2 flow and his arteries ? suggestive of microvascular disease or endothelial dysfunction.  He ? recently has been having more chest pain and is referred for repeat ? catheterization.  He does continue to smoke.  This was done in the ? outpatient lab. ?  ? PROCEDURES PERFORMED: ? 1. Selective coronary angiography. ? 2. Left heart catheterization. ? 3. Left ventriculogram. ? 4. Abdominal aortogram. ?  ? DESCRIPTION OF PROCEDURE:  The risks and indication of procedure ? explained.  Consent was signed and placed  on the chart.  Right groin ? area was prepped and draped in routine sterile fashion, anesthetized ? with 1% local lidocaine.  A 4-French arterial sheath was placed in the ? right femoral artery using a modified Seldinger technique.  Standard ? catheters including a JL-4, 3-D RC and angled pigtail were used ? procedure.  All catheter exchanges made over wire.  There were no ? apparent complications.  Central aortic pressure is 103/76 with mean of ? 90.  LV  was 113/7 with an EDP of 11.  There was no aortic stenosis. ?  ? Left main was normal. ?  ? LAD was a moderate-sized vessel that gave off a moderate-to-large ? diagonal in the midsection and several small diagonals.  There is a 30% ? lesion proximally.  The mid-to-distal LAD appeared to have mild to ? moderate diffuse disease, but also appeared to have a component of ? vasospasm.  Intracoronary nitroglycerin was not administered.  Distally ? it was TIMI-2 flow. ?  ? Left circumflex gave off a large OM-1 and three posterolaterals.  It was ? angiographically normal and had borderline TIMI-2 flow. ?  ? Right coronary artery was moderate size codominant vessel that gave off ? an RV branch and a PDA.  There was some mild luminal irregularities ? throughout.  Flow was just minimally diminished. ?  ? Left ventriculogram done in the RAO position showed an EF of 60% with no ? wall motion abnormalities. ?  ? Abdominal aortogram done to rule out any aneurysmal dilatation and ? peripheral vascular disease given his smoking showed a 30% lesion in the ? left renal.  The right renal was okay.  There was narrow abdominal ? aortic aneurysm. ?  ? ASSESSMENT: ? 1. Mild nonobstructive coronary artery disease as described above. ? 2. TIMI-2 flow in most of the coronary beds likely secondary to ?     endothelial dysfunction or microvascular disease with a possible ?     component of vasospasm. ? 3. Normal left ventricular function. ? 4. Thirty-percent left renal artery stenosis. ?  ? PLAN:  [name] truly has evidence of endothelial dysfunction and ? perhaps microvascular disease and vasospasm.  He will need aggressive ? risk factor management.  I emphasized to him that stopping smoking was ? very, very important.  He also needed to be treated with aspirin, ? nitroglycerin and possibly calcium channel blockers.  He will follow up ? with Dr. Harrington Challenger. ?  ? Shaune Pascal. Bensimhon, MD ? Electronically Signed ?  ? DRB/MEDQ  D:  06/17/2007  T:   06/18/2007  Job:  [id number]  ? ? ?Past Medical History:  ?Diagnosis Date  ? Arthritis   ? Bladder cancer [facility]) urologist-- dr Louis Meckel  ? 11/ 2019 recurrent   ? Cancer of renal pelvis, right ([place])   ? oncologist-  dr [name]--  high grade papillary urothelial carcinoma, Stage III (T3 Nx M0) -- 02-12-2017  s/p  left nephrectomy--- completed chemotherapy 07-27-2017  ? Cataract   ? Chemotherapy-induced neuropathy ([place])   ? feet  ? CKD (chronic kidney disease) stage 3, GFR 30-59 ml/min (HCC)   ? Coronary artery disease   ? Coronary vasospasm ([place]) PER DR ROSS NOTE 03-22-2012--  INTER. TIGHTNESS  ? PER PT PROBABLE FROM STRESS  ? Depression   ? no rx  ? Dyslipidemia   ? pt unable to tolerate niaspan  ? GAD (generalized anxiety disorder)   ? GERD (gastroesophageal reflux disease)   ? H/O  right nephrectomy   ? Robotic assisted laparoscopic right nephro-ureterectomy 2018  ? Hearing loss   ? per pt due to chemotherapy  ? History of angina CHRONIC -- CONTROLLED W/ IMDUR  ? History of cancer chemotherapy   ? completed 07-27-2017  for renal carcinoma  ? 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 Hyperlipidemia   ? Hypertension   ? Left renal artery stenosis (HCC)   ? Myocardial infarction [facility]) 2005  ? Nocturia more than twice per night   ? OSA on CPAP   ? Pneumonia   ? Renal artery stenosis ([place])   ? Sleep apnea   ? Thrombocytopenia ([place])   ? Type 2 diabetes mellitus treated with insulin ([place])   ? endocrinologist-- dr Steffanie  ([place] in [place])  lov note in epic   ? Urgency of urination   ? Wears glasses   ? Wisdom teeth extracted   ? ? ?Past Surgical History:  ?Procedure Laterality Date  ? APPENDECTOMY  08-03-2004  ? CARDIAC CATHETERIZATION  x3  last one 06-17-2007  ? MILD NON-OBSTRUCTIVE CAD/ NORMAL LVF/ 30% LEFT RENAL ARTERY STENOSIS  ? CARDIOVASCULAR STRESS TEST  02/23/2013  ? Low risk nuclear study w/ small inferior wall infarct at mid and basal level with no ischemia/  normal LV  function and wall motion , ef 57%  ? CYSTO/ BILATERAL RETROGRADE PYELOGRAM/ RIGHT URETEROSCOPIC LASER FULGURATION URETERAL TUMOR  02-16-2008  ? CYSTO/ BLADDER BX  09-13-2008;  08-13-2007; 04-19-2007; 06-01-2006

## 2021-09-03 ENCOUNTER — Ambulatory Visit: Payer: Medicare Other | Admitting: Physician Assistant

## 2021-09-03 ENCOUNTER — Encounter: Payer: Self-pay | Admitting: Physician Assistant

## 2021-09-03 VITALS — BP 132/80 | HR 65 | Ht 70.0 in | Wt 221.6 lb

## 2021-09-03 DIAGNOSIS — Z01810 Encounter for preprocedural cardiovascular examination: Secondary | ICD-10-CM | POA: Diagnosis not present

## 2021-09-03 DIAGNOSIS — I10 Essential (primary) hypertension: Secondary | ICD-10-CM

## 2021-09-03 DIAGNOSIS — I2510 Atherosclerotic heart disease of native coronary artery without angina pectoris: Secondary | ICD-10-CM | POA: Diagnosis not present

## 2021-09-03 DIAGNOSIS — E785 Hyperlipidemia, unspecified: Secondary | ICD-10-CM

## 2021-09-03 DIAGNOSIS — I701 Atherosclerosis of renal artery: Secondary | ICD-10-CM

## 2021-09-03 NOTE — Patient Instructions (Signed)
Medication Instructions:  ?Do not take nitroglycerin if you have taken tadalafil in the last 48 hours. The opposite is true as well. Do not take tadalafil if you have taken nitroglycerin in the last 48 hours. If you take these two medicines, they can cause low blood pressure if taken close together.  ? ?*If you need a refill on your cardiac medications before your next appointment, please call your pharmacy* ? ? ?Lab Work: ?TODAY-CMET ?If you have labs (blood work) drawn today and your tests are completely normal, you will receive your results only by: ?MyChart Message (if you have MyChart) OR ?A paper copy in the mail ?If you have any lab test that is abnormal or we need to change your treatment, we will call you to review the results. ? ? ?Testing/Procedures: ?NONE ORDERED ? ? ?Follow-Up: ?At [facility], you and your health needs are our priority.  As part of our continuing mission to provide you with exceptional heart care, we have created designated Provider Care Teams.  These Care Teams include your primary Cardiologist (physician) and Advanced Practice Providers (APPs -  Physician Assistants and Nurse Practitioners) who all work together to provide you with the care you need, when you need it. ? ?We recommend signing up for the patient portal called "MyChart".  Sign up information is provided on this After Visit Summary.  MyChart is used to connect with patients for Virtual Visits (Telemedicine).  Patients are able to view lab/test results, encounter notes, upcoming appointments, etc.  Non-urgent messages can be sent to your provider as well.   ?To learn more about what you can do with MyChart, go to [email or web address].   ? ?Your next appointment:   ?1 month(s) ? ?The format for your next appointment:   ?In Person ? ?Provider:   ?[name], MD   ? ? ?Other Instructions ?YOU HAVE BEEN CLEARED FOR SURGERY  ? ?Important Information About Sugar ? ? ? ? ? ? ? ?Please monitor your blood pressure  occasionally at home. Call your doctor if you tend to get readings of greater than 130 on the top number or 80 on the bottom number. ? ? ? ?

## 2021-09-03 NOTE — Telephone Encounter (Signed)
Pt is scheduled to see Melina Copa, PA-C, today, 09/03/21, clearance will be addressed at that time.  ?

## 2021-09-04 ENCOUNTER — Other Ambulatory Visit: Payer: Self-pay | Admitting: Orthopaedic Surgery

## 2021-09-04 LAB — COMPREHENSIVE METABOLIC PANEL
ALT: 51 IU/L — ABNORMAL HIGH (ref 0–44)
AST: 34 IU/L (ref 0–40)
Albumin/Globulin Ratio: 1.9 (ref 1.2–2.2)
Albumin: 4.3 g/dL (ref 3.8–4.8)
Alkaline Phosphatase: 59 IU/L (ref 44–121)
BUN/Creatinine Ratio: 11 (ref 10–24)
BUN: 21 mg/dL (ref 8–27)
Bilirubin Total: 0.5 mg/dL (ref 0.0–1.2)
CO2: 23 mmol/L (ref 20–29)
Calcium: 9.3 mg/dL (ref 8.6–10.2)
Chloride: 103 mmol/L (ref 96–106)
Creatinine, Ser: 1.94 mg/dL — ABNORMAL HIGH (ref 0.76–1.27)
Globulin, Total: 2.3 g/dL (ref 1.5–4.5)
Glucose: 225 mg/dL — ABNORMAL HIGH (ref 70–99)
Potassium: 5.2 mmol/L (ref 3.5–5.2)
Sodium: 139 mmol/L (ref 134–144)
Total Protein: 6.6 g/dL (ref 6.0–8.5)
eGFR: 37 mL/min/{1.73_m2} — ABNORMAL LOW (ref >59)

## 2021-09-18 DIAGNOSIS — G4733 Obstructive sleep apnea (adult) (pediatric): Secondary | ICD-10-CM | POA: Diagnosis not present

## 2021-09-23 NOTE — Progress Notes (Signed)
? ?Your procedure is scheduled on Tuesday September 01, 2021. ? ?Report to [facility] Main Entrance "A" at 07:15 A.M., and check in at the Admitting office. ? ?Call this number if you have problems the morning of surgery: [phone] ? ?Call [phone] if you have any questions prior to your surgery date Monday-Friday 8am-4pm ? ? ?Remember: Do not eat after midnight the night before your surgery ? ?You may drink clear liquids until 06:45 the morning of your surgery.   ?Clear liquids allowed are: Water, Non-Citrus Juices (without pulp), Carbonated Beverages, Clear Tea, Black Coffee Only, and Gatorade ? ?Please complete your PRE-SURGERY G2 that was provided to you by 06:45 AM the morning of surgery.  Please, if able, drink it in one setting. DO NOT SIP. ?  ?Take these medicines the morning of surgery with A SIP OF WATER: ?acetaminophen (TYLENOL)  if needed ?ALPRAZolam Duanne Moron) if needed ?ondansetron [facility]) if needed ? ?pantoprazole (PROTONIX) ?ranolazine (RANEXA)  ?rosuvastatin (CRESTOR)  ? ?As of today, STOP taking any Aleve, Naproxen, Ibuprofen, Motrin, Advil, Goody's, BC's, all herbal medications, fish oil, and all vitamins. ? ?Follow your surgeon's instructions on when to stop Aspirin.  If no instructions were given by your surgeon then you will need to call the office to get those instructions.   ? ? ?** PLEASE check your blood sugar the morning of your surgery when you wake up and every 2 hours until you get to the Short Stay unit. ? ?If your blood sugar is less than 70 mg/dL, you will need to treat for low blood sugar: ?Do not take insulin. ?Treat a low blood sugar (less than 70 mg/dL) with ? cup of clear juice (cranberry or apple), 4 glucose tablets, OR glucose gel. ?Recheck blood sugar in 15 minutes after treatment (to make sure it is greater than 70 mg/dL). If your blood sugar is not greater than 70 mg/dL on recheck, call [phone] for further instructions. ? ?glimepiride (AMARYL)  ?5/22 AM - take as  usual, PM - none ?5/23 NONE ? ?insulin glargine, 1 Unit Dial, (TOUJEO)  ?5/22 AM 40 units, PM 20 units ?5/23 AM 20 units ? ?Semaglutide ?None DOS 5/23 ? ? ?The Morning of Surgery ? Do not wear jewelry, make-up or nail polish. ? Do not wear lotions, powders, or colognes, or deodorant   ?Men may shave face and neck. ? Do not bring valuables to the hospital. ? [facility] is not responsible for any belongings or valuables. ? ?If you are a smoker, DO NOT Smoke 24 hours prior to surgery ? ?If you wear a CPAP at night please bring your mask the morning of surgery  ? ?Remember that you must have someone to transport you home after your surgery, and remain with you for 24 hours if you are discharged the same day. ? ? ?Please bring cases for contacts, glasses, hearing aids, dentures or bridgework because it cannot be worn into surgery.  ? ? ?Leave your suitcase in the car.  After surgery it may be brought to your room. ? ?For patients admitted to the hospital, discharge time will be determined by your treatment team. ? ?Patients discharged the day of surgery will not be allowed to drive home.  ? ? ?Special instructions:   ?[place]- Preparing For Surgery ? ?Before surgery, you can play an important role. Because skin is not sterile, your skin needs to be as free of germs as possible. You can reduce the number of germs on your skin  by washing with CHG (chlorahexidine gluconate) Soap before surgery.  CHG is an antiseptic cleaner which kills germs and bonds with the skin to continue killing germs even after washing.   ? ?Oral Hygiene is also important to reduce your risk of infection.  Remember - BRUSH YOUR TEETH THE MORNING OF SURGERY WITH YOUR REGULAR TOOTHPASTE ? ?Please do not use if you have an allergy to CHG or antibacterial soaps. If your skin becomes reddened/irritated stop using the CHG.  ?Do not shave (including legs and underarms) for at least 48 hours prior to first CHG shower. It is OK to shave your  face. ? ?Please follow these instructions carefully. ?  ?Shower the NIGHT BEFORE SURGERY and the MORNING OF SURGERY with CHG Soap.  ? ?If you chose to wash your hair and body, wash as usual with your normal shampoo and body-wash/soap. ? ?Rinse your hair and body thoroughly to remove the shampoo and soap. ? ?Apply CHG directly to the skin (ONLY FROM THE NECK DOWN) and wash gently with a scrungie or a clean washcloth.  ? ?Do not use on open wounds or open sores. Avoid contact with your eyes, ears, mouth and genitals (private parts). Wash Face and genitals (private parts)  with your normal soap.  ? ?Wash thoroughly, paying special attention to the area where your surgery will be performed. ? ?Thoroughly rinse your body with warm water from the neck down. ? ?DO NOT shower/wash with your normal soap after using and rinsing off the CHG Soap. ? ?Pat yourself dry with a CLEAN TOWEL. ? ?Wear CLEAN PAJAMAS to bed the night before surgery ? ?Place CLEAN SHEETS on your bed the night of your first shower and DO NOT SLEEP WITH PETS. ? ?Wear comfortable clothes the morning of surgery.  ? ? ? ?Day of Surgery: ? ?Please shower the morning of surgery with the CHG soap ?Do not apply any deodorants/lotions. ?Please wear clean clothes to the hospital/surgery center.   ?Remember to brush your teeth WITH YOUR REGULAR TOOTHPASTE. ? ?NO VISITORS WILL BE ALLOWED IN PRE-OP WHERE PATIENTS ARE PREPPED FOR SURGERY.  ONLY 1 SUPPORT PERSON MAY BE PRESENT IN THE WAITING ROOM WHILE YOU ARE IN SURGERY.  IF YOU ARE TO BE ADMITTED, ONCE YOU ARE IN YOUR ROOM YOU WILL BE ALLOWED TWO (2) VISITORS. 1 (ONE) VISITOR MAY STAY OVERNIGHT BUT MUST ARRIVE TO THE ROOM BY 8pm.  Minor children may have two parents present. Special consideration for safety and communication needs will be reviewed on a case by case basis. ?  ? ? ?Please read over the following fact sheets that you were given. ? ?  ? ? ?   ? ?

## 2021-09-24 ENCOUNTER — Encounter (HOSPITAL_COMMUNITY): Payer: Self-pay

## 2021-09-24 ENCOUNTER — Encounter (HOSPITAL_COMMUNITY)
Admission: RE | Admit: 2021-09-24 | Discharge: 2021-09-24 | Disposition: A | Discharge status: Home / Self Care | Payer: Medicare Other | Source: Ambulatory Visit | Attending: Orthopaedic Surgery | Admitting: Orthopaedic Surgery

## 2021-09-24 ENCOUNTER — Other Ambulatory Visit: Payer: Self-pay

## 2021-09-24 VITALS — BP 146/97 | HR 71 | Temp 98.5°F | Resp 18 | Ht 70.0 in | Wt 218.0 lb

## 2021-09-24 DIAGNOSIS — N1832 Chronic kidney disease, stage 3b: Secondary | ICD-10-CM | POA: Diagnosis not present

## 2021-09-24 DIAGNOSIS — Z79899 Other long term (current) drug therapy: Secondary | ICD-10-CM | POA: Insufficient documentation

## 2021-09-24 DIAGNOSIS — E785 Hyperlipidemia, unspecified: Secondary | ICD-10-CM | POA: Insufficient documentation

## 2021-09-24 DIAGNOSIS — Z794 Long term (current) use of insulin: Secondary | ICD-10-CM | POA: Diagnosis not present

## 2021-09-24 DIAGNOSIS — I252 Old myocardial infarction: Secondary | ICD-10-CM | POA: Insufficient documentation

## 2021-09-24 DIAGNOSIS — Z01812 Encounter for preprocedural laboratory examination: Secondary | ICD-10-CM | POA: Insufficient documentation

## 2021-09-24 DIAGNOSIS — Z01818 Encounter for other preprocedural examination: Secondary | ICD-10-CM

## 2021-09-24 DIAGNOSIS — E1122 Type 2 diabetes mellitus with diabetic chronic kidney disease: Secondary | ICD-10-CM | POA: Diagnosis not present

## 2021-09-24 DIAGNOSIS — D696 Thrombocytopenia, unspecified: Secondary | ICD-10-CM | POA: Diagnosis not present

## 2021-09-24 DIAGNOSIS — Z7982 Long term (current) use of aspirin: Secondary | ICD-10-CM | POA: Diagnosis not present

## 2021-09-24 DIAGNOSIS — I129 Hypertensive chronic kidney disease with stage 1 through stage 4 chronic kidney disease, or unspecified chronic kidney disease: Secondary | ICD-10-CM | POA: Diagnosis not present

## 2021-09-24 LAB — BASIC METABOLIC PANEL
Anion gap: 8 (ref 5–15)
BUN: 21 mg/dL (ref 8–23)
CO2: 26 mmol/L (ref 22–32)
Calcium: 9.5 mg/dL (ref 8.9–10.3)
Chloride: 105 mmol/L (ref 98–111)
Creatinine, Ser: 1.86 mg/dL — ABNORMAL HIGH (ref 0.61–1.24)
GFR, Estimated: 39 mL/min — ABNORMAL LOW (ref >60)
Glucose, Bld: 130 mg/dL — ABNORMAL HIGH (ref 70–99)
Potassium: 4.8 mmol/L (ref 3.5–5.1)
Sodium: 139 mmol/L (ref 135–145)

## 2021-09-24 LAB — HEMOGLOBIN A1C
Hgb A1c MFr Bld: 7.3 % — ABNORMAL HIGH (ref 4.8–5.6)
Mean Plasma Glucose: 162.81 mg/dL

## 2021-09-24 LAB — CBC
HCT: 44.6 % (ref 39.0–52.0)
Hemoglobin: 15.3 g/dL (ref 13.0–17.0)
MCH: 33.9 pg (ref 26.0–34.0)
MCHC: 34.3 g/dL (ref 30.0–36.0)
MCV: 98.9 fL (ref 80.0–100.0)
Platelets: 123 10*3/uL — ABNORMAL LOW (ref 150–400)
RBC: 4.51 MIL/uL (ref 4.22–5.81)
RDW: 13 % (ref 11.5–15.5)
WBC: 4.4 10*3/uL (ref 4.0–10.5)
nRBC: 0 % (ref 0.0–0.2)

## 2021-09-24 LAB — GLUCOSE, CAPILLARY: Glucose-Capillary: 157 mg/dL — ABNORMAL HIGH (ref 70–99)

## 2021-09-24 NOTE — Progress Notes (Signed)
Abnormal lab in PAT: Creatinine 1.65. Dr. [name] office was called and notified Katharine Look). ?

## 2021-09-24 NOTE — Progress Notes (Addendum)
PCP - Tedra Senegal, MD ?Cardiologist - [name], MD ? ?PPM/ICD - denies ?Device Orders - n/a ?Rep Notified - n/a ? ?Chest x-ray - n/a ?EKG - 09/03/2021 ?Stress Test - 12/10/2020 ?ECHO - 02/03/2017 ?Cardiac Cath - 06/17/2007 ? ?Sleep Study - denies ?CPAP - n/a ? ?Fasting Blood Sugar - 75 - 180 ?Checks Blood Sugar once a day ?CBG today - 157 ?A1C - done in PAT on 09/24/2021 ? ?Blood Thinner Instructions: n/a ? ?Aspirin Instructions: Patient was instructed to call MD and ask if he must hold Aspirin prior surgery. Patient verbalized understanding. ? ?Patient was instructed: As of today, STOP taking any Aleve, Naproxen, Ibuprofen, Motrin, Advil, Goody's, BC's, all herbal medications, fish oil, and all vitamins. ? ?ERAS Protcol - yes ?PRE-SURGERY - G2- until 06:45 ? ?COVID TEST- n/a ? ? ?Anesthesia review: yes, cardiac history; pre-op evaluation, creatinine 1.86 ? ?Patient denies shortness of breath, fever, cough and chest pain at PAT appointment ? ? ?All instructions explained to the patient, with a verbal understanding of the material. Patient agrees to go over the instructions while at home for a better understanding. Patient also instructed to self quarantine after being tested for COVID-19. The opportunity to ask questions was provided. ?  ?

## 2021-09-25 NOTE — Anesthesia Preprocedure Evaluation (Addendum)
Anesthesia Evaluation  Patient identified by MRN, date of birth, ID band Patient awake    Reviewed: Allergy & Precautions, NPO status , Patient's Chart, lab work & pertinent test results, reviewed documented beta blocker date and time   History of Anesthesia Complications Negative for: history of anesthetic complications  Airway Mallampati: II  TM Distance: >3 FB Neck ROM: Full    Dental no notable dental hx.    Pulmonary sleep apnea and Continuous Positive Airway Pressure Ventilation , former smoker,    Pulmonary exam normal        Cardiovascular hypertension, Pt. on medications and Pt. on home beta blockers + CAD, + Past MI (2005) and + Peripheral Vascular Disease  Normal cardiovascular exam     Neuro/Psych Anxiety Depression negative neurological ROS     GI/Hepatic Neg liver ROS, GERD  Medicated and Controlled,  Endo/Other  diabetes, Type 2, Oral Hypoglycemic Agents, Insulin Dependent  Renal/GU Renal InsufficiencyRenal disease (s/p nephrectomy, Cr 1.78 )  negative genitourinary   Musculoskeletal  (+) Arthritis , LEFT POSTERIOR TIBIAL TENDON RUPTURE, PES PLANOVALGUS   Abdominal   Peds  Hematology Plt 126k   Anesthesia Other Findings Day of surgery medications reviewed with patient.  Reproductive/Obstetrics negative OB ROS                           Anesthesia Physical Anesthesia Plan  ASA: 2  Anesthesia Plan: General   Post-op Pain Management: Tylenol PO (pre-op)* and Regional block*   Induction: Intravenous  PONV Risk Score and Plan: 2 and Treatment may vary due to age or medical condition, Midazolam, Dexamethasone and Ondansetron  Airway Management Planned: LMA  Additional Equipment: None  Intra-op Plan:   Post-operative Plan: Extubation in OR  Informed Consent: I have reviewed the patients History and Physical, chart, labs and discussed the procedure including the risks,  benefits and alternatives for the proposed anesthesia with the patient or authorized representative who has indicated his/her understanding and acceptance.     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  Nuclear stress 2022 was normal.  Last echo 2018 showed EF 65%, grade 1 DD, mild LVH.  Patient last seen by Isaac Copa, PA-C 09/03/2021 for preop evaluation.  Per note, "RCRI 0.9% indicating low CV risk. He is able to exert 8.23 METS on DASI without any angina or dyspnea. Higher levels of activity are limited by orthopedic issue rather than any cardiac complaints.Therefore,based on ACC/AHA guidelines, Isaac Pinzon O'Reillywould be at acceptable risk for the planned procedure without further cardiovascular testing.The patient was advised that if hedevelops new symptoms prior to surgery to contact our office to arrange for a follow-up visit, andheverbalized understanding.Given no hx of MI/PCI, he is OK to hold ASA if needed for surgery (usually hold up to 5-7 days but will defer duration needed to surgeon). Will fax copy of this note to requesting surgeon."  Follows with oncology for history of urothelial carcinomaof the RIGHT renal hilum s/p right nephroureterectomy on 02/12/2017 and adjuvant chemotherapy.  History of CKD 3B.  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  IDDM 2, A1c 7.3 on preop labs.  Preop labs reviewed, creatinine elevated 1.86 consistent with history of CKD 3B, platelets mildly low at 123 consistent with history of mild thrombocytopenia.  Remainder of  labs unremarkable.   EKG 09/03/2021: NSR with marked sinus arrhythmia.  Rate 65.  LAD.  Incomplete right bundle branch block.  Nonspecific T wave changes.  Nuclear stress 12/10/2020: . Lexiscan stress is electrcially negative for  ischemia . Myoview scan shows normal perfusion No ischemia or scar . Nuclear stress EF: 54%. . This is a low risk study.  TTE 02/03/2017: - Left ventricle: The cavity size was normal. Wall thickness was  increased in a pattern of mild LVH. Systolic function was normal.  The estimated ejection fraction was in the range of 60% to 65%.  Wall motion was normal; there were no regional wall motion  abnormalities. Doppler parameters are consistent with abnormal  left ventricular relaxation (grade 1 diastolic dysfunction).  GLS:-18.4%   )      Anesthesia Quick Evaluation

## 2021-09-25 NOTE — Progress Notes (Signed)
Anesthesia Chart Review: ? 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  Nuclear stress 2022 was normal.  Last echo 2018 showed EF 65%, grade 1 DD, mild LVH.  Patient last seen by Melina Copa, PA-C 09/03/2021 for preop evaluation.  Per note, " RCRI 0.9% indicating low CV risk. He is able to exert 8.23 METS on DASI without any angina or dyspnea. Higher levels of activity are limited by orthopedic issue rather than any cardiac complaints. Therefore, based on ACC/AHA guidelines, GARIN MATA would be at acceptable risk for the planned procedure without further cardiovascular testing. The patient was advised that if he develops new symptoms prior to surgery to contact our office to arrange for a follow-up visit, and he verbalized understanding. Given no hx of MI/PCI, he is OK to hold ASA if needed for surgery (usually hold up to 5-7 days but will defer duration needed to surgeon). Will fax copy of this note to requesting surgeon." ? ?Follows with oncology for history of urothelial carcinoma of the RIGHT renal hilum s/p right nephroureterectomy on 02/12/2017 and adjuvant chemotherapy. ? ?History of CKD 3B. ? 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 ? ?IDDM 2, A1c 7.3 on preop labs. ? ?Preop labs reviewed, creatinine elevated 1.86 consistent with history of CKD 3B, platelets mildly low at 123 consistent with history of mild thrombocytopenia.  Remainder of labs unremarkable. ? ? ?EKG 09/03/2021: NSR with marked sinus arrhythmia.  Rate 65.  LAD.  Incomplete right bundle branch block.  Nonspecific T wave changes. ? ?Nuclear stress 12/10/2020: ?Lexiscan stress is electrcially negative for ischemia ?Myoview scan shows normal perfusion No ischemia or scar ?Nuclear stress EF: 54%. ?This is a low risk study. ? ?TTE 02/03/2017: ?- Left ventricle: The cavity size was normal. Wall thickness was  ?  increased in a pattern of mild LVH. Systolic function  was normal.  ?  The estimated ejection fraction was in the range of 60% to 65%.  ?  Wall motion was normal; there were no regional wall motion  ?  abnormalities. Doppler parameters are consistent with abnormal  ?  left ventricular relaxation (grade 1 diastolic dysfunction).  ?  GLS:-18.4%  ?  ? ? ?Karoline Caldwell, PA-C ?[facility]/Anesthesiology ?Phone [phone] ?09/25/2021 4:08 PM ? ?

## 2021-09-30 ENCOUNTER — Other Ambulatory Visit (HOSPITAL_BASED_OUTPATIENT_CLINIC_OR_DEPARTMENT_OTHER): Payer: Self-pay

## 2021-09-30 ENCOUNTER — Encounter: Payer: Self-pay | Admitting: Hematology & Oncology

## 2021-09-30 ENCOUNTER — Other Ambulatory Visit: Payer: Self-pay

## 2021-09-30 ENCOUNTER — Ambulatory Visit: Payer: Medicare Other

## 2021-09-30 ENCOUNTER — Inpatient Hospital Stay: Payer: Medicare Other | Attending: Hematology & Oncology

## 2021-09-30 ENCOUNTER — Inpatient Hospital Stay (HOSPITAL_BASED_OUTPATIENT_CLINIC_OR_DEPARTMENT_OTHER): Payer: Medicare Other | Admitting: Hematology & Oncology

## 2021-09-30 ENCOUNTER — Inpatient Hospital Stay: Payer: Medicare Other

## 2021-09-30 ENCOUNTER — Encounter: Payer: Self-pay | Admitting: Family

## 2021-09-30 VITALS — BP 155/80 | HR 65 | Temp 98.0°F | Resp 18 | Ht 70.0 in | Wt 219.0 lb

## 2021-09-30 DIAGNOSIS — C649 Malignant neoplasm of unspecified kidney, except renal pelvis: Secondary | ICD-10-CM

## 2021-09-30 DIAGNOSIS — Z452 Encounter for adjustment and management of vascular access device: Secondary | ICD-10-CM | POA: Insufficient documentation

## 2021-09-30 DIAGNOSIS — Z95828 Presence of other vascular implants and grafts: Secondary | ICD-10-CM

## 2021-09-30 DIAGNOSIS — Z85528 Personal history of other malignant neoplasm of kidney: Secondary | ICD-10-CM | POA: Insufficient documentation

## 2021-09-30 DIAGNOSIS — F419 Anxiety disorder, unspecified: Secondary | ICD-10-CM

## 2021-09-30 DIAGNOSIS — C651 Malignant neoplasm of right renal pelvis: Secondary | ICD-10-CM | POA: Diagnosis not present

## 2021-09-30 DIAGNOSIS — C670 Malignant neoplasm of trigone of bladder: Secondary | ICD-10-CM

## 2021-09-30 LAB — CMP (CANCER CENTER ONLY)
ALT: 54 U/L — ABNORMAL HIGH (ref 0–44)
AST: 32 U/L (ref 15–41)
Albumin: 4 g/dL (ref 3.5–5.0)
Alkaline Phosphatase: 49 U/L (ref 38–126)
Anion gap: 6 (ref 5–15)
BUN: 25 mg/dL — ABNORMAL HIGH (ref 8–23)
CO2: 26 mmol/L (ref 22–32)
Calcium: 9.2 mg/dL (ref 8.9–10.3)
Chloride: 104 mmol/L (ref 98–111)
Creatinine: 1.78 mg/dL — ABNORMAL HIGH (ref 0.61–1.24)
GFR, Estimated: 42 mL/min — ABNORMAL LOW (ref >60)
Glucose, Bld: 128 mg/dL — ABNORMAL HIGH (ref 70–99)
Potassium: 3.9 mmol/L (ref 3.5–5.1)
Sodium: 136 mmol/L (ref 135–145)
Total Bilirubin: 0.5 mg/dL (ref 0.3–1.2)
Total Protein: 6.4 g/dL — ABNORMAL LOW (ref 6.5–8.1)

## 2021-09-30 LAB — CBC WITH DIFFERENTIAL (CANCER CENTER ONLY)
Abs Immature Granulocytes: 0.03 10*3/uL (ref 0.00–0.07)
Basophils Absolute: 0 10*3/uL (ref 0.0–0.1)
Basophils Relative: 1 %
Eosinophils Absolute: 0.1 10*3/uL (ref 0.0–0.5)
Eosinophils Relative: 2 %
HCT: 41.1 % (ref 39.0–52.0)
Hemoglobin: 14.3 g/dL (ref 13.0–17.0)
Immature Granulocytes: 1 %
Lymphocytes Relative: 36 %
Lymphs Abs: 1.4 10*3/uL (ref 0.7–4.0)
MCH: 34.1 pg — ABNORMAL HIGH (ref 26.0–34.0)
MCHC: 34.8 g/dL (ref 30.0–36.0)
MCV: 98.1 fL (ref 80.0–100.0)
Monocytes Absolute: 0.5 10*3/uL (ref 0.1–1.0)
Monocytes Relative: 13 %
Neutro Abs: 1.8 10*3/uL (ref 1.7–7.7)
Neutrophils Relative %: 47 %
Platelet Count: 126 10*3/uL — ABNORMAL LOW (ref 150–400)
RBC: 4.19 MIL/uL — ABNORMAL LOW (ref 4.22–5.81)
RDW: 12.8 % (ref 11.5–15.5)
WBC Count: 3.8 10*3/uL — ABNORMAL LOW (ref 4.0–10.5)
nRBC: 0 % (ref 0.0–0.2)

## 2021-09-30 LAB — LACTATE DEHYDROGENASE: LDH: 159 U/L (ref 98–192)

## 2021-09-30 MED ORDER — SODIUM CHLORIDE 0.9% FLUSH
10.0000 mL | Freq: Once | INTRAVENOUS | Status: AC
  Administered 2021-09-30: 10 mL via INTRAVENOUS

## 2021-09-30 MED ORDER — ALPRAZOLAM 1 MG PO TABS: 1.0000 mg | ORAL_TABLET | Freq: Three times a day (TID) | ORAL | 0 refills | Status: DC | PRN

## 2021-09-30 MED ORDER — ONDANSETRON HCL 4 MG PO TABS: 4.0000 mg | ORAL_TABLET | Freq: Three times a day (TID) | ORAL | 3 refills | Status: DC | PRN

## 2021-09-30 MED ORDER — ONDANSETRON HCL 4 MG PO TABS
4.0000 mg | ORAL_TABLET | Freq: Three times a day (TID) | ORAL | 3 refills | Status: AC | PRN
  Filled 2021-09-30: qty 30, 10d supply, fill #0

## 2021-09-30 MED ORDER — ALPRAZOLAM 1 MG PO TABS
1.0000 mg | ORAL_TABLET | Freq: Three times a day (TID) | ORAL | 0 refills | Status: DC | PRN
  Filled 2021-09-30: qty 30, 10d supply, fill #0

## 2021-09-30 MED ORDER — HEPARIN SOD (PORK) LOCK FLUSH 100 UNIT/ML IV SOLN
500.0000 [IU] | Freq: Once | INTRAVENOUS | Status: AC
  Administered 2021-09-30: 500 [IU] via INTRAVENOUS

## 2021-09-30 NOTE — Patient Instructions (Signed)
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
## 2021-10-01 ENCOUNTER — Ambulatory Visit (HOSPITAL_COMMUNITY)
Admission: RE | Admit: 2021-10-01 | Discharge: 2021-10-03 | Disposition: A | Discharge status: Skilled Nursing Facility | Payer: Medicare Other | Source: Ambulatory Visit | Attending: Orthopaedic Surgery | Admitting: Orthopaedic Surgery

## 2021-10-01 ENCOUNTER — Ambulatory Visit (HOSPITAL_COMMUNITY): Payer: Medicare Other

## 2021-10-01 ENCOUNTER — Other Ambulatory Visit: Payer: Self-pay

## 2021-10-01 ENCOUNTER — Encounter (HOSPITAL_COMMUNITY)
Admission: RE | Disposition: A | Discharge status: Skilled Nursing Facility | Payer: Self-pay | Source: Ambulatory Visit | Attending: Orthopaedic Surgery

## 2021-10-01 ENCOUNTER — Ambulatory Visit (HOSPITAL_COMMUNITY): Payer: Medicare Other | Admitting: Physician Assistant

## 2021-10-01 ENCOUNTER — Encounter (HOSPITAL_COMMUNITY): Payer: Self-pay | Admitting: Orthopaedic Surgery

## 2021-10-01 ENCOUNTER — Ambulatory Visit (HOSPITAL_BASED_OUTPATIENT_CLINIC_OR_DEPARTMENT_OTHER): Payer: Medicare Other | Admitting: Anesthesiology

## 2021-10-01 DIAGNOSIS — Z794 Long term (current) use of insulin: Secondary | ICD-10-CM | POA: Insufficient documentation

## 2021-10-01 DIAGNOSIS — I252 Old myocardial infarction: Secondary | ICD-10-CM | POA: Diagnosis not present

## 2021-10-01 DIAGNOSIS — M6281 Muscle weakness (generalized): Secondary | ICD-10-CM | POA: Insufficient documentation

## 2021-10-01 DIAGNOSIS — G4730 Sleep apnea, unspecified: Secondary | ICD-10-CM | POA: Insufficient documentation

## 2021-10-01 DIAGNOSIS — M79672 Pain in left foot: Secondary | ICD-10-CM | POA: Diagnosis not present

## 2021-10-01 DIAGNOSIS — G8918 Other acute postprocedural pain: Secondary | ICD-10-CM | POA: Diagnosis not present

## 2021-10-01 DIAGNOSIS — S86112A Strain of other muscle(s) and tendon(s) of posterior muscle group at lower leg level, left leg, initial encounter: Secondary | ICD-10-CM

## 2021-10-01 DIAGNOSIS — N183 Chronic kidney disease, stage 3 unspecified: Secondary | ICD-10-CM | POA: Diagnosis not present

## 2021-10-01 DIAGNOSIS — Z905 Acquired absence of kidney: Secondary | ICD-10-CM | POA: Insufficient documentation

## 2021-10-01 DIAGNOSIS — M66272 Spontaneous rupture of extensor tendons, left ankle and foot: Secondary | ICD-10-CM | POA: Diagnosis not present

## 2021-10-01 DIAGNOSIS — I2510 Atherosclerotic heart disease of native coronary artery without angina pectoris: Secondary | ICD-10-CM | POA: Diagnosis not present

## 2021-10-01 DIAGNOSIS — Z87891 Personal history of nicotine dependence: Secondary | ICD-10-CM | POA: Insufficient documentation

## 2021-10-01 DIAGNOSIS — E1122 Type 2 diabetes mellitus with diabetic chronic kidney disease: Secondary | ICD-10-CM | POA: Insufficient documentation

## 2021-10-01 DIAGNOSIS — M1990 Unspecified osteoarthritis, unspecified site: Secondary | ICD-10-CM | POA: Insufficient documentation

## 2021-10-01 DIAGNOSIS — R262 Difficulty in walking, not elsewhere classified: Secondary | ICD-10-CM | POA: Diagnosis not present

## 2021-10-01 DIAGNOSIS — R2689 Other abnormalities of gait and mobility: Secondary | ICD-10-CM | POA: Insufficient documentation

## 2021-10-01 DIAGNOSIS — F419 Anxiety disorder, unspecified: Secondary | ICD-10-CM | POA: Insufficient documentation

## 2021-10-01 DIAGNOSIS — E1100 Type 2 diabetes mellitus with hyperosmolarity without nonketotic hyperglycemic-hyperosmolar coma (NKHHC): Secondary | ICD-10-CM

## 2021-10-01 DIAGNOSIS — Q666 Other congenital valgus deformities of feet: Secondary | ICD-10-CM | POA: Insufficient documentation

## 2021-10-01 DIAGNOSIS — X58XXXA Exposure to other specified factors, initial encounter: Secondary | ICD-10-CM | POA: Diagnosis not present

## 2021-10-01 DIAGNOSIS — Z7984 Long term (current) use of oral hypoglycemic drugs: Secondary | ICD-10-CM | POA: Insufficient documentation

## 2021-10-01 DIAGNOSIS — K219 Gastro-esophageal reflux disease without esophagitis: Secondary | ICD-10-CM | POA: Diagnosis not present

## 2021-10-01 DIAGNOSIS — M2142 Flat foot [pes planus] (acquired), left foot: Secondary | ICD-10-CM

## 2021-10-01 DIAGNOSIS — Z01818 Encounter for other preprocedural examination: Secondary | ICD-10-CM

## 2021-10-01 DIAGNOSIS — I129 Hypertensive chronic kidney disease with stage 1 through stage 4 chronic kidney disease, or unspecified chronic kidney disease: Secondary | ICD-10-CM | POA: Insufficient documentation

## 2021-10-01 HISTORY — PX: FLEXOR TENDON REPAIR: SHX6501

## 2021-10-01 HISTORY — PX: CALCANEAL OSTEOTOMY: SHX1281

## 2021-10-01 HISTORY — PX: REPAIR EXTENSOR TENDON: SHX5382

## 2021-10-01 LAB — CBC
HCT: 40.4 % (ref 39.0–52.0)
Hemoglobin: 14 g/dL (ref 13.0–17.0)
MCH: 34.1 pg — ABNORMAL HIGH (ref 26.0–34.0)
MCHC: 34.7 g/dL (ref 30.0–36.0)
MCV: 98.3 fL (ref 80.0–100.0)
Platelets: 107 10*3/uL — ABNORMAL LOW (ref 150–400)
RBC: 4.11 MIL/uL — ABNORMAL LOW (ref 4.22–5.81)
RDW: 13.2 % (ref 11.5–15.5)
WBC: 5.2 10*3/uL (ref 4.0–10.5)
nRBC: 0 % (ref 0.0–0.2)

## 2021-10-01 LAB — GLUCOSE, CAPILLARY
Glucose-Capillary: 122 mg/dL — ABNORMAL HIGH (ref 70–99)
Glucose-Capillary: 97 mg/dL (ref 70–99)
Glucose-Capillary: 425 mg/dL — ABNORMAL HIGH (ref 70–99)

## 2021-10-01 LAB — CREATININE, SERUM
Creatinine, Ser: 2.13 mg/dL — ABNORMAL HIGH (ref 0.61–1.24)
GFR, Estimated: 34 mL/min — ABNORMAL LOW (ref >60)

## 2021-10-01 SURGERY — OSTEOTOMY, CALCANEUS
Anesthesia: General | Laterality: Left

## 2021-10-01 MED ORDER — ORAL CARE MOUTH RINSE
15.0000 mL | Freq: Once | OROMUCOSAL | Status: AC
Start: 2021-10-01 — End: 2021-10-01

## 2021-10-01 MED ORDER — OXYCODONE HCL 5 MG/5ML PO SOLN: 5.0000 mg | Freq: Once | ORAL | Status: DC | PRN

## 2021-10-01 MED ORDER — DIPHENHYDRAMINE HCL 12.5 MG/5ML PO ELIX: 12.5000 mg | ORAL_SOLUTION | ORAL | Status: DC | PRN

## 2021-10-01 MED ORDER — LACTATED RINGERS IV SOLN: INTRAVENOUS | Status: DC

## 2021-10-01 MED ORDER — CEFAZOLIN SODIUM-DEXTROSE 2-4 GM/100ML-% IV SOLN
2.0000 g | INTRAVENOUS | Status: AC
  Administered 2021-10-01: 2 g via INTRAVENOUS
  Filled 2021-10-01: qty 100

## 2021-10-01 MED ORDER — LIDOCAINE 2% (20 MG/ML) 5 ML SYRINGE
INTRAMUSCULAR | Status: DC | PRN
  Administered 2021-10-01: 100 mg via INTRAVENOUS

## 2021-10-01 MED ORDER — HYDROCODONE-ACETAMINOPHEN 5-325 MG PO TABS
ORAL_TABLET | ORAL | Status: AC
  Filled 2021-10-01: qty 2

## 2021-10-01 MED ORDER — DOCUSATE SODIUM 100 MG PO CAPS
100.0000 mg | ORAL_CAPSULE | Freq: Two times a day (BID) | ORAL | Status: DC
  Administered 2021-10-02 – 2021-10-03 (×3): 100 mg via ORAL
  Filled 2021-10-01 (×3): qty 1

## 2021-10-01 MED ORDER — FENTANYL CITRATE (PF) 100 MCG/2ML IJ SOLN
INTRAMUSCULAR | Status: AC
  Administered 2021-10-01: 100 ug via INTRAVENOUS
  Filled 2021-10-01: qty 2

## 2021-10-01 MED ORDER — METOCLOPRAMIDE HCL 5 MG PO TABS: 5.0000 mg | ORAL_TABLET | Freq: Three times a day (TID) | ORAL | Status: DC | PRN

## 2021-10-01 MED ORDER — ONDANSETRON HCL 4 MG/2ML IJ SOLN
INTRAMUSCULAR | Status: DC | PRN
  Administered 2021-10-01: 4 mg via INTRAVENOUS

## 2021-10-01 MED ORDER — METOCLOPRAMIDE HCL 5 MG/ML IJ SOLN: 5.0000 mg | Freq: Three times a day (TID) | INTRAMUSCULAR | Status: DC | PRN

## 2021-10-01 MED ORDER — BUPIVACAINE-EPINEPHRINE (PF) 0.5% -1:200000 IJ SOLN
INTRAMUSCULAR | Status: DC | PRN
  Administered 2021-10-01: 20 mL via PERINEURAL
  Administered 2021-10-01: 10 mL via PERINEURAL

## 2021-10-01 MED ORDER — MIDAZOLAM HCL 2 MG/2ML IJ SOLN
INTRAMUSCULAR | Status: DC | PRN
  Administered 2021-10-01: 2 mg via INTRAVENOUS

## 2021-10-01 MED ORDER — MIDAZOLAM HCL 2 MG/2ML IJ SOLN
INTRAMUSCULAR | Status: AC
  Filled 2021-10-01: qty 2

## 2021-10-01 MED ORDER — FENTANYL CITRATE (PF) 100 MCG/2ML IJ SOLN
INTRAMUSCULAR | Status: AC
  Filled 2021-10-01: qty 2

## 2021-10-01 MED ORDER — FENTANYL CITRATE (PF) 100 MCG/2ML IJ SOLN: 100.0000 ug | Freq: Once | INTRAMUSCULAR | Status: AC

## 2021-10-01 MED ORDER — HYDROCODONE-ACETAMINOPHEN 5-325 MG PO TABS
1.0000 | ORAL_TABLET | ORAL | Status: DC | PRN
  Administered 2021-10-01 (×2): 2 via ORAL
  Administered 2021-10-01: 1 via ORAL
  Administered 2021-10-02: 2 via ORAL
  Filled 2021-10-01 (×5): qty 2

## 2021-10-01 MED ORDER — PROPOFOL 10 MG/ML IV BOLUS
INTRAVENOUS | Status: DC | PRN
  Administered 2021-10-01: 200 mg via INTRAVENOUS

## 2021-10-01 MED ORDER — METHOCARBAMOL 500 MG PO TABS
500.0000 mg | ORAL_TABLET | Freq: Four times a day (QID) | ORAL | Status: DC | PRN
  Administered 2021-10-01 – 2021-10-03 (×6): 500 mg via ORAL
  Filled 2021-10-01 (×6): qty 1

## 2021-10-01 MED ORDER — MIDAZOLAM HCL 2 MG/2ML IJ SOLN
INTRAMUSCULAR | Status: AC
  Administered 2021-10-01: 2 mg via INTRAVENOUS
  Filled 2021-10-01: qty 2

## 2021-10-01 MED ORDER — OXYCODONE HCL 5 MG PO TABS: 5.0000 mg | ORAL_TABLET | Freq: Once | ORAL | Status: DC | PRN

## 2021-10-01 MED ORDER — DEXAMETHASONE SODIUM PHOSPHATE 10 MG/ML IJ SOLN
INTRAMUSCULAR | Status: DC | PRN
  Administered 2021-10-01: 10 mg via INTRAVENOUS

## 2021-10-01 MED ORDER — HYDROCODONE-ACETAMINOPHEN 7.5-325 MG PO TABS: 1.0000 | ORAL_TABLET | ORAL | Status: DC | PRN

## 2021-10-01 MED ORDER — MORPHINE SULFATE (PF) 2 MG/ML IV SOLN
0.5000 mg | INTRAVENOUS | Status: DC | PRN
  Administered 2021-10-01 – 2021-10-02 (×3): 1 mg via INTRAVENOUS
  Filled 2021-10-01 (×3): qty 1

## 2021-10-01 MED ORDER — ACETAMINOPHEN 500 MG PO TABS
500.0000 mg | ORAL_TABLET | Freq: Four times a day (QID) | ORAL | Status: AC
  Administered 2021-10-01 – 2021-10-02 (×3): 500 mg via ORAL
  Filled 2021-10-01 (×3): qty 1

## 2021-10-01 MED ORDER — CEFAZOLIN SODIUM-DEXTROSE 2-4 GM/100ML-% IV SOLN
2.0000 g | Freq: Four times a day (QID) | INTRAVENOUS | Status: AC
  Administered 2021-10-01 – 2021-10-02 (×3): 2 g via INTRAVENOUS
  Filled 2021-10-01 (×3): qty 100

## 2021-10-01 MED ORDER — FENTANYL CITRATE (PF) 100 MCG/2ML IJ SOLN
25.0000 ug | INTRAMUSCULAR | Status: DC | PRN
  Administered 2021-10-01 (×2): 25 ug via INTRAVENOUS

## 2021-10-01 MED ORDER — 0.9 % SODIUM CHLORIDE (POUR BTL) OPTIME
TOPICAL | Status: DC | PRN
  Administered 2021-10-01: 1000 mL

## 2021-10-01 MED ORDER — CLONIDINE HCL (ANALGESIA) 100 MCG/ML EP SOLN
EPIDURAL | Status: DC | PRN
  Administered 2021-10-01: 67 ug
  Administered 2021-10-01: 33 ug

## 2021-10-01 MED ORDER — MIDAZOLAM HCL 2 MG/2ML IJ SOLN: 2.0000 mg | Freq: Once | INTRAMUSCULAR | Status: AC

## 2021-10-01 MED ORDER — CHLORHEXIDINE GLUCONATE 0.12 % MT SOLN
15.0000 mL | Freq: Once | OROMUCOSAL | Status: AC
  Administered 2021-10-01: 15 mL via OROMUCOSAL
  Filled 2021-10-01: qty 15

## 2021-10-01 MED ORDER — ENOXAPARIN SODIUM 40 MG/0.4ML IJ SOSY
40.0000 mg | PREFILLED_SYRINGE | INTRAMUSCULAR | Status: DC
  Administered 2021-10-02 – 2021-10-03 (×2): 40 mg via SUBCUTANEOUS
  Filled 2021-10-01 (×2): qty 0.4

## 2021-10-01 MED ORDER — PHENYLEPHRINE HCL-NACL 20-0.9 MG/250ML-% IV SOLN
INTRAVENOUS | Status: DC | PRN
  Administered 2021-10-01: 25 ug/min via INTRAVENOUS

## 2021-10-01 MED ORDER — SULFAMETHOXAZOLE-TRIMETHOPRIM 400-80 MG/5ML IV SOLN
320.0000 mg | Freq: Three times a day (TID) | INTRAVENOUS | Status: DC
  Filled 2021-10-01: qty 20

## 2021-10-01 MED ORDER — DEXTROSE 5 % IV SOLN
500.0000 mg | Freq: Four times a day (QID) | INTRAVENOUS | Status: DC | PRN
  Filled 2021-10-01: qty 5

## 2021-10-01 SURGICAL SUPPLY — 60 items
ANCHOR SUT BIO SW 4.75X19.1 (Anchor) ×1 IMPLANT
BAG COUNTER SPONGE SURGICOUNT (BAG) ×2 IMPLANT
BIT DRILL CANN COMP 5.0 (BIT) ×1 IMPLANT
BLADE SAW SGTL MED 73X18.5 STR (BLADE) ×1 IMPLANT
BLADE SURG 15 STRL LF DISP TIS (BLADE) ×2 IMPLANT
BLADE SURG 15 STRL SS (BLADE) ×2
BNDG ELASTIC 6X10 VLCR STRL LF (GAUZE/BANDAGES/DRESSINGS) ×2 IMPLANT
CANISTER SUCT 3000ML PPV (MISCELLANEOUS) ×2 IMPLANT
CHLORAPREP W/TINT 26 (MISCELLANEOUS) ×2 IMPLANT
COVER SURGICAL LIGHT HANDLE (MISCELLANEOUS) ×2 IMPLANT
CUFF TOURN SGL QUICK 34 (TOURNIQUET CUFF) ×2
CUFF TRNQT CYL 34X4.125X (TOURNIQUET CUFF) ×1 IMPLANT
DRAPE OEC MINIVIEW 54X84 (DRAPES) ×1 IMPLANT
DRAPE U-SHAPE 47X51 STRL (DRAPES) ×2 IMPLANT
DRSG PAD ABDOMINAL 8X10 ST (GAUZE/BANDAGES/DRESSINGS) ×1 IMPLANT
ELECT REM PT RETURN 9FT ADLT (ELECTROSURGICAL) ×2
ELECTRODE REM PT RTRN 9FT ADLT (ELECTROSURGICAL) ×1 IMPLANT
GAUZE SPONGE 4X4 12PLY STRL (GAUZE/BANDAGES/DRESSINGS) ×2 IMPLANT
GAUZE XEROFORM 1X8 LF (GAUZE/BANDAGES/DRESSINGS) ×1 IMPLANT
GLOVE BIO SURGEON STRL SZ7 (GLOVE) ×4 IMPLANT
GLOVE BIO SURGEON STRL SZ8 (GLOVE) ×2 IMPLANT
GLOVE BIOGEL M STRL SZ7.5 (GLOVE) ×2 IMPLANT
GLOVE BIOGEL PI IND STRL 7.5 (GLOVE) ×1 IMPLANT
GLOVE BIOGEL PI IND STRL 8 (GLOVE) ×2 IMPLANT
GLOVE BIOGEL PI INDICATOR 7.5 (GLOVE) ×1
GLOVE BIOGEL PI INDICATOR 8 (GLOVE) ×2
GLOVE ECLIPSE 8.0 STRL XLNG CF (GLOVE) ×2 IMPLANT
GOWN STRL REUS W/ TWL LRG LVL3 (GOWN DISPOSABLE) ×2 IMPLANT
GOWN STRL REUS W/ TWL XL LVL3 (GOWN DISPOSABLE) ×1 IMPLANT
GOWN STRL REUS W/TWL LRG LVL3 (GOWN DISPOSABLE) ×4
GOWN STRL REUS W/TWL XL LVL3 (GOWN DISPOSABLE) ×2
GUIDEWIRE W/TRCR TIP 2.4X9.25 (WIRE) ×2 IMPLANT
KIT BASIN OR (CUSTOM PROCEDURE TRAY) ×2 IMPLANT
KIT BIO-TENODESIS 3X8 DISP (MISCELLANEOUS) ×2
KIT INSRT BABSR STRL DISP BTN (MISCELLANEOUS) IMPLANT
KIT SIZER GRAFT TENODESIS SUTR (KITS) ×1 IMPLANT
KIT TURNOVER KIT B (KITS) ×2 IMPLANT
NS IRRIG 1000ML POUR BTL (IV SOLUTION) ×2 IMPLANT
PACK ORTHO EXTREMITY (CUSTOM PROCEDURE TRAY) ×2 IMPLANT
PAD ARMBOARD 7.5X6 YLW CONV (MISCELLANEOUS) ×4 IMPLANT
PAD CAST 4YDX4 CTTN HI CHSV (CAST SUPPLIES) ×1 IMPLANT
PADDING CAST COTTON 4X4 STRL (CAST SUPPLIES) ×2
PADDING CAST SYN 6 (CAST SUPPLIES) ×1
PADDING CAST SYNTHETIC 4 (CAST SUPPLIES) ×2
PADDING CAST SYNTHETIC 4X4 STR (CAST SUPPLIES) ×1 IMPLANT
PADDING CAST SYNTHETIC 6X4 NS (CAST SUPPLIES) IMPLANT
SCREW COMPR FT 7X45 (Screw) ×1 IMPLANT
SCREW COMPR FT 7X55 (Screw) ×1 IMPLANT
SPONGE T-LAP 18X18 ~~LOC~~+RFID (SPONGE) ×2 IMPLANT
SUCTION FRAZIER HANDLE 10FR (MISCELLANEOUS) ×2
SUCTION TUBE FRAZIER 10FR DISP (MISCELLANEOUS) ×1 IMPLANT
SUT ETHILON 2 0 FS 18 (SUTURE) ×1 IMPLANT
SUT ETHILON 3 0 PS 1 (SUTURE) ×2 IMPLANT
SUT VIC AB 2-0 CT1 (SUTURE) ×1 IMPLANT
TOWEL GREEN STERILE (TOWEL DISPOSABLE) ×2 IMPLANT
TOWEL GREEN STERILE FF (TOWEL DISPOSABLE) ×4 IMPLANT
TUBE CONNECTING 12X1/4 (SUCTIONS) ×2 IMPLANT
TUBE CONNECTING 20X1/4 (TUBING) ×4 IMPLANT
UNDERPAD 30X36 HEAVY ABSORB (UNDERPADS AND DIAPERS) ×2 IMPLANT
WATER STERILE IRR 1000ML POUR (IV SOLUTION) ×2 IMPLANT

## 2021-10-01 NOTE — Transfer of Care (Signed)
Immediate Anesthesia Transfer of Care Note  Patient: Isaac [name]  Procedure(s) Performed: LEFT FOOT MEDIAL DISPLACEMENT CALCANEAL OSTEOTOMY (Left) POSTERIOR TIBIAL TENDON RECONSTRUCTION (Left) CALCANEONAVICULAR LIGAMENT RECONSTRUCTION (Left) POSSIBLE FLEXOR DIGITORUM LONGUS TENDON TRANSFER (Left)  Patient Location: PACU  Anesthesia Type:GA combined with regional for post-op pain  Level of Consciousness: sedated  Airway & Oxygen Therapy: Patient Spontanous Breathing and Patient connected to nasal cannula oxygen  Post-op Assessment: Report given to RN and Post -op Vital signs reviewed and stable  Post vital signs: Reviewed and stable  Last Vitals:  Vitals Value Taken Time  BP 122/88 10/01/21 1155  Temp    Pulse 75 10/01/21 1157  Resp 17 10/01/21 1157  SpO2 96 % 10/01/21 1157  Vitals shown include unvalidated device data.  Last Pain:  Vitals:   10/01/21 0803  TempSrc:   PainSc: 4       Patients Stated Pain Goal: 0 (23/53/61 [id number])  Complications: No notable events documented.

## 2021-10-01 NOTE — Anesthesia Postprocedure Evaluation (Signed)
Anesthesia Post Note  Patient: Isaac [name]  Procedure(s) Performed: LEFT FOOT MEDIAL DISPLACEMENT CALCANEAL OSTEOTOMY (Left) POSTERIOR TIBIAL TENDON RECONSTRUCTION (Left) CALCANEONAVICULAR LIGAMENT RECONSTRUCTION (Left) POSSIBLE FLEXOR DIGITORUM LONGUS TENDON TRANSFER (Left)     Patient location during evaluation: PACU Anesthesia Type: General Level of consciousness: awake and alert Pain management: pain level controlled Vital Signs Assessment: post-procedure vital signs reviewed and stable Respiratory status: spontaneous breathing, nonlabored ventilation and respiratory function stable Cardiovascular status: blood pressure returned to baseline Postop Assessment: no apparent nausea or vomiting Anesthetic complications: no   No notable events documented.  Last Vitals:  Vitals:   10/01/21 1240 10/01/21 1255  BP: 136/87 136/90  Pulse: 70 65  Resp: 16 14  Temp:    SpO2: 97% 97%    Last Pain:  Vitals:   10/01/21 1155  TempSrc:   PainSc: 0-No pain                 Marthenia Rolling

## 2021-10-01 NOTE — Progress Notes (Signed)
CPAP set up at bedside w/ pt home mask. Pt states he will place himself on when he is ready. RT will cont to monitor as needed.

## 2021-10-01 NOTE — Anesthesia Procedure Notes (Addendum)
Procedure Name: LMA Insertion Date/Time: 10/01/2021 10:00 AM Performed by: Minerva Ends, CRNA Pre-anesthesia Checklist: Patient identified, Emergency Drugs available, Suction available and Patient being monitored Patient Re-evaluated:Patient Re-evaluated prior to induction Oxygen Delivery Method: Circle system utilized Preoxygenation: Pre-oxygenation with 100% oxygen Induction Type: IV induction LMA: LMA inserted LMA Size: 4.0 Tube type: Oral Number of attempts: 1 Placement Confirmation: positive ETCO2 and breath sounds checked- equal and bilateral Tube secured with: Tape Dental Injury: Teeth and Oropharynx as per pre-operative assessment

## 2021-10-01 NOTE — Anesthesia Procedure Notes (Signed)
Anesthesia Regional Block: Adductor canal block   Pre-Anesthetic Checklist: , timeout performed,  Correct Patient, Correct Site, Correct Laterality,  Correct Procedure, Correct Position, site marked,  Risks and benefits discussed,  Pre-op evaluation,  At surgeon's request and post-op pain management  Laterality: Left  Prep: Maximum Sterile Barrier Precautions used, chloraprep       Needles:  Injection technique: Single-shot  Needle Type: Echogenic Stimulator Needle     Needle Length: 9cm  Needle Gauge: 22     Additional Needles:   Procedures:,,,, ultrasound used (permanent image in chart),,    Narrative:  Start time: 10/01/2021 9:11 AM End time: 10/01/2021 9:14 AM Injection made incrementally with aspirations every 5 mL.  Performed by: Personally  Anesthesiologist: Brennan Bailey, MD  Additional Notes: Risks, benefits, and alternative discussed. Patient gave consent for procedure. Patient prepped and draped in sterile fashion. Sedation administered, patient remains easily responsive to voice. Relevant anatomy identified with ultrasound guidance. Local anesthetic given in 5cc increments with no signs or symptoms of intravascular injection. No pain or paraesthesias with injection. Patient monitored throughout procedure with signs of LAST or immediate complications. Tolerated well. Ultrasound image placed in chart.  Tawny Asal, MD

## 2021-10-01 NOTE — H&P (Signed)
PREOPERATIVE H&P  Chief Complaint: Left foot pain  HPI: Isaac [name] is a 66 y.o. male who presents for preoperative history and physical with a diagnosis of left post tib rupture with pes planovalgus and hindfoot valgus.  He ruptured his tendon few months ago and had pain and progressive collapse of his foot.  MRI scan revealed a rupture.  He failed conservative treatment. Symptoms are rated as moderate to severe, and have been worsening.  This is significantly impairing activities of daily living.  He has elected for surgical management.   Past Medical History:  Diagnosis Date   Arthritis    Bladder cancer [facility]) urologist-- dr Louis Meckel   11/ 2019 recurrent    Cancer of renal pelvis, right [facility])    oncologist-  dr [name]--  high grade papillary urothelial carcinoma, Stage III (T3 Nx M0) -- 02-12-2017  s/p  left nephrectomy--- completed chemotherapy 07-27-2017   Cataract    Chemotherapy-induced neuropathy ([place])    feet   CKD (chronic kidney disease) stage 3, GFR 30-59 ml/min (HCC)    Coronary artery disease    Coronary vasospasm ([place]) PER DR ROSS NOTE 03-22-2012--  INTER. 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/ reimplantation   Hyperlipidemia    Hypertension    Left renal artery stenosis (HCC)    Myocardial infarction ([place]) 2005   Nocturia more than twice per night    OSA on CPAP    Pneumonia    Renal artery stenosis (HCC)    Sleep apnea    Thrombocytopenia ([place])    Type 2 diabetes mellitus  treated with insulin [facility])    endocrinologist-- dr Steffanie Dunn ([facility] in [place])  lov note in epic    Urgency of urination    Wears glasses    Wisdom teeth extracted    Past Surgical History:  Procedure Laterality Date   APPENDECTOMY  08/03/2004   CARDIAC CATHETERIZATION  x3  last one 06-17-2007   MILD NON-OBSTRUCTIVE CAD/ NORMAL LVF/ 30% LEFT RENAL ARTERY STENOSIS   CARDIOVASCULAR STRESS TEST  02/23/2013   Low risk nuclear study w/ small inferior wall infarct at mid and basal level with no ischemia/  normal LV function and wall motion , ef 57%   COLON SURGERY     CYSTO/ BILATERAL RETROGRADE PYELOGRAM/ RIGHT URETEROSCOPIC LASER FULGURATION URETERAL TUMOR  02/16/2008   CYSTO/ BLADDER BX  09-13-2008;  08-13-2007; 04-19-2007; 06-01-2006   CYSTO/ CYSTOGRAM/ URETEROSCOPY  02/21/2009   CYSTO/ RESECTION BLADDER TUMOR/ LEFT RETROGRADE PYELOGRAM/ RIGHT URETEROSCOPY  08/07/2010   TCC OF BLADDER   S/P DISTAL URETERECTOMY/ REIMPLANTATION   CYSTO/ RIGHT URETEROSCOPY/ BX URETERAL TUMOR  10/11/2008   CYSTOSCOPY W/ RETROGRADES  04/19/2012   Procedure: CYSTOSCOPY WITH RETROGRADE PYELOGRAM;  Surgeon: Bernestine Amass, MD;  Location: [facility];  Service: Urology;  Laterality: Bilateral;  30 MINS Cysto, Biopsy, possible TURBT, Bilateral retrograde pyelograms with Mitomycin C instilation Post op  CYSTOSCOPY W/ RETROGRADES Left 03/25/2018   Procedure: CYSTOSCOPY WITH RETROGRADE PYELOGRAM;  Surgeon: [name], MD;  Location: [facility];  Service: Urology;  Laterality: Left;   CYSTOSCOPY WITH BIOPSY  04/07/2011   Procedure: CYSTOSCOPY WITH BIOPSY/ RIGHT URETEROSCOPY;  Surgeon: Bernestine Amass, MD;  Location: [facility];  Service: Urology;  Laterality: N/A;    cystoscopy, cystogram, biopsy and fulgeration   CYSTOSCOPY WITH BIOPSY  12/29/2011   Procedure: CYSTOSCOPY WITH BIOPSY;  Surgeon: Bernestine Amass, MD;  Location: [facility];   Service: Urology;  Laterality: N/A;  30 MIN  WITH FULGERATION     CYSTOSCOPY WITH RETROGRADE PYELOGRAM, URETEROSCOPY AND STENT PLACEMENT Left 04/28/2018   Procedure: LEFT RETROGRADE PYELOGRAM, DIAGNOSTIC LEFT URETEROSCOPY;  Surgeon: [name], MD;  Location: [facility];  Service: Urology;  Laterality: Left;   CYSTOSCOPY WITH STENT PLACEMENT N/A 03/09/2020   Procedure: CYSTOSCOPY WITH FIREFLYER  PLACEMENT;  Surgeon: [name], MD;  Location: [facility];  Service: Urology;  Laterality: N/A;   ELBOW SURGERY  07/07/2003   RIGHT ELBOW ARTHROSCOPY W/ OPEN RECONSTRUCTION   IR FLUORO GUIDE PORT INSERTION RIGHT  04/01/2017   IR US GUIDE VASC ACCESS RIGHT  04/01/2017   JOINT REPLACEMENT     bilateral knees   KNEE ARTHROSCOPY W/ DEBRIDEMENT  02/08/2004   RIGHT KNEE   OSTOMY N/A 03/09/2020   Procedure: POSSIBLE OSTOMY;  Surgeon: [name], MD;  Location: [facility];  Service: General;  Laterality: N/A;   PANENDOSCOPY N/A 03/09/2020   Procedure: PANENDOSCOPY RIGID;  Surgeon: [name], MD;  Location: [facility];  Service: General;  Laterality: N/A;   RHINOPLASTY W/ MODIFIED CARTILAGE GRAFT  05/17/2007   INTERNAL NASAL VALVE COLLAPSE/ OSA/ SEPTAL PERFERATION   RIGHT DISTAL URETERECTOMY W/ REIMPLANTATION  10/30/2008   TCC OF RIGHT DISTAL URETER   ROBOT ASSITED LAPAROSCOPIC NEPHROURETERECTOMY Right 02/12/2017   Procedure: XI ROBOT ASSITED LAPAROSCOPIC NEPHROURETERECTOMY;  Surgeon: [name], MD;  Location: [facility];  Service: Urology;  Laterality: Right;   SHOULDER SURGERY  2005   RIGHT ROTATOR CUFF REPAIR   TOTAL KNEE ARTHROPLASTY Left 05/18/2015   TOTAL KNEE ARTHROPLASTY Left 05/18/2015   Procedure: TOTAL KNEE ARTHROPLASTY;  Surgeon: Earlie Server, MD;  Location: [employer];  Service: Orthopedics;  Laterality: Left;   TOTAL KNEE ARTHROPLASTY Right 03/21/2016   Procedure: TOTAL KNEE ARTHROPLASTY;  Surgeon: Earlie Server, MD;  Location: [facility];  Service: Orthopedics;  Laterality:  Right;   TRANSTHORACIC ECHOCARDIOGRAM  02/03/2017   ef 08-65%, grade 1 diastolic dysfunction/  trivial PR   TRANSURETHRAL RESECTION OF BLADDER TUMOR  01/13/2007   TRANSURETHRAL RESECTION OF BLADDER TUMOR  04/19/2012   Procedure: TRANSURETHRAL RESECTION OF BLADDER TUMOR (TURBT);  Surgeon: Bernestine Amass, MD;  Location: [facility];  Service: Urology;  Laterality: N/A;   TRANSURETHRAL RESECTION OF BLADDER TUMOR N/A 03/25/2018   Procedure: TRANSURETHRAL RESECTION OF BLADDER TUMOR (TURBT);  Surgeon: [name], MD;  Location: [facility];  Service: Urology;  Laterality: N/A;   TRANSURETHRAL RESECTION OF BLADDER TUMOR N/A 04/28/2018   Procedure: TRANSURETHRAL RESECTION OF BLADDER TUMOR (TURBT);  Surgeon: [name], MD;  Location[facility];  Service: Urology;  Laterality: N/A;   umbillical hernia repair  2004   Social History   Socioeconomic History   Marital status: Single    Spouse name: Not on file   Number of children: Not on file   Years of  education: Not on file   Highest education level: Not on file  Occupational History   Not on file  Tobacco Use   Smoking status: Former    Years: 20.00    Types: Cigarettes    Quit date: 05/13/2007    Years since quitting: 14.3   Smokeless tobacco: Never  Vaping Use   Vaping Use: Never used  Substance and Sexual Activity   Alcohol use: Not Currently    Alcohol/week: 3.0 - 4.0 standard drinks    Types: 3 - 4 Cans of beer per week   Drug use: No   Sexual activity: Not on file  Other Topics Concern   Not on file  Social History Narrative   Family history: Father died of an accident at age 77.  Father with history of CABG.  Mother died at age 38 apparently of accidental carbon monoxide poisoning when she locked herself out of the house and stayed in the running car to keep warm.  Maternal grandmother with history of diabetes.  1 sister.  Maternal grandfather with history of lung cancer.   Social  Determinants of Health   Financial Resource Strain: Medium Risk   Difficulty of Paying Living Expenses: Somewhat hard  Food Insecurity: No Food Insecurity   Worried About Charity fundraiser in the Last Year: Never true   Ran Out of Food in the Last Year: Never true  Transportation Needs: No Transportation Needs   Lack of Transportation (Medical): No   Lack of Transportation (Non-Medical): No  Physical Activity: Inactive   Days of Exercise per Week: 0 days   Minutes of Exercise per Session: 0 min  Stress: Stress Concern Present   Feeling of Stress : To some extent  Social Connections: Moderately Isolated   Frequency of Communication with Friends and Family: More than three times a week   Frequency of Social Gatherings with Friends and Family: More than three times a week   Attends Religious Services: 1 to 4 times per year   Active Member of [employer] or Organizations: No   Attends Music therapist: Never   Marital Status: Never married   Family History  Problem Relation Age of Onset   Coronary artery disease Father    Diabetes Maternal Grandmother    Heart disease Paternal Grandfather    Colon cancer Neg Hx    Stomach cancer Neg Hx    Esophageal cancer Neg Hx    Rectal cancer Neg Hx    Allergies  Allergen Reactions   Nsaids Other (See Comments)    Has 1 kidney left   Prior to Admission medications   Medication Sig Start Date End Date Taking? Authorizing Provider  aspirin EC 81 MG tablet Take 81 mg by mouth at bedtime.    Yes [provider]  glimepiride (AMARYL) 2 MG tablet TAKE 1 BY MOUTH DAILY BEFORE SUPPER Patient taking differently: Take 2 mg by mouth 2 (two) times daily. 11/28/15  Yes Elayne Snare, MD  insulin glargine, 1 Unit Dial, (TOUJEO) 300 UNIT/ML Solostar Pen Inject 40 Units into the skin 2 (two) times daily.    Yes [provider]  metoprolol succinate (TOPROL-XL) 25 MG 24 hr tablet TAKE 1 TABLET BY MOUTH AT BEDTIME. GENERIC EQUIVALENT  FOR TOPROL-XL 08/20/21  Yes [name], MD  Multiple Vitamins-Minerals (MULTIVITAMIN WITH MINERALS) tablet Take 1 tablet by mouth daily.   Yes [provider]  nitroGLYCERIN (NITROSTAT) 0.4 MG SL tablet Place 1 tablet (0.4 mg  total) under the tongue every 5 (five) minutes as needed for chest pain. Repeat every 5 minute up to 3 doses if no relief call 911. Patient not taking: Reported on 09/30/2021 11/09/19  Yes [name], MD  pantoprazole (PROTONIX) 40 MG tablet Take 1 tablet (40 mg total) by mouth every morning. 08/20/21  Yes [name], MD  PRESCRIPTION MEDICATION Inhale into the lungs at bedtime. CPAP   Yes [provider]  Probiotic Product (PROBIOTIC PO) Take 1 tablet by mouth daily.   Yes [provider]  ranolazine (RANEXA) 500 MG 12 hr tablet Take 1 tablet (500 mg total) by mouth 2 (two) times daily. 11/09/20  Yes [name], MD  rOPINIRole (REQUIP) 2 MG tablet TAKE 1 TABLET BY MOUTH AT BEDTIME 02/03/21  Yes [name], [name], MD  rosuvastatin (CRESTOR) 10 MG tablet Take 1 tablet (10 mg total) by mouth every other day. 07/31/20  Yes [name], MD  Semaglutide,0.25 or 0.'5MG'$ /DOS, (OZEMPIC, 0.25 OR 0.5 MG/DOSE,) 2 MG/1.5ML SOPN Inject 0.5 mg into the skin once a week. 04/11/20  Yes [provider]  tadalafil (CIALIS) 20 MG tablet Take 1 tablet by mouth daily as needed 07/30/21  Yes   tamsulosin (FLOMAX) 0.4 MG CAPS capsule Take 2 capsules by mouth everyday 02/01/21  Yes   acetaminophen (TYLENOL) 500 MG tablet Take 500 mg by mouth every 6 (six) hours as needed for moderate pain or headache.    [provider]  ALPRAZolam Duanne Moron) 1 MG tablet Take 1 tablet (1 mg total) by mouth 3 (three) times daily as needed for anxiety. 09/30/21   [name], MD  ondansetron (ZOFRAN) 4 MG tablet Take 1 tablet (4 mg total) by mouth every 8 (eight) hours as needed. 09/30/21   [name], MD     Positive ROS: All other systems have been reviewed and were  otherwise negative with the exception of those mentioned in the HPI and as above.  Physical Exam:  Vitals:   10/01/21 0758  BP: (!) 170/100  Pulse: 65  Resp: 17  Temp: 97.9 F (36.6 C)  SpO2: 96%   General: Alert, no acute distress Cardiovascular: No pedal edema Respiratory: No cyanosis, no use of accessory musculature GI: No organomegaly, abdomen is soft and non-tender Skin: No lesions in the area of chief complaint Neurologic: Sensation intact distally Psychiatric: Patient is competent for consent with normal mood and affect Lymphatic: No axillary or cervical lymphadenopathy  MUSCULOSKELETAL: Left foot demonstrates swelling along the distal aspect of the posted tendon.  Weakness with post tib tendon testing and pain against resistance.  Slight pes planovalgus with hindfoot valgus.  Slight forefoot abduction with supination.  Sensation grossly intact.  Foot is warm and well-perfused.  Assessment: Left foot posterior tibial tendon rupture with resultant hindfoot valgus and likely tearing of his spring ligament   Plan: Plan for calcaneal slide osteotomy with posterior tibial tendon reconstruction versus flexor digitorum longus tendon reconstruction and transfer.  Possible medial column plantarflexion osteotomy versus repair of the spring ligament or both.  Patient lives alone and is requesting to go to a skilled nursing facility postoperatively.  He will be brought in for observation and to work with physical therapy.  He will be nonweightbearing approximately 6 weeks postoperatively..  We discussed the risks, benefits and alternatives of surgery which include but are not limited to wound healing complications, infection, nonunion, malunion, need for further surgery, damage to surrounding structures and continued pain.  They understand there is no guarantees to an acceptable outcome.  After weighing these risks they opted to proceed with surgery.     Erle Crocker,  MD    10/01/2021 8:57 AM

## 2021-10-01 NOTE — Anesthesia Procedure Notes (Signed)
Anesthesia Regional Block: Popliteal block   Pre-Anesthetic Checklist: , timeout performed,  Correct Patient, Correct Site, Correct Laterality,  Correct Procedure, Correct Position, site marked,  Risks and benefits discussed,  Pre-op evaluation,  At surgeon's request and post-op pain management  Laterality: Left  Prep: Maximum Sterile Barrier Precautions used, chloraprep       Needles:  Injection technique: Single-shot  Needle Type: Echogenic Stimulator Needle     Needle Length: 9cm  Needle Gauge: 22     Additional Needles:   Procedures:,,,, ultrasound used (permanent image in chart),,    Narrative:  Start time: 10/01/2021 9:14 AM End time: 10/01/2021 9:16 AM Injection made incrementally with aspirations every 5 mL.  Performed by: Personally  Anesthesiologist: Brennan Bailey, MD  Additional Notes: Risks, benefits, and alternative discussed. Patient gave consent for procedure. Patient prepped and draped in sterile fashion. Sedation administered, patient remains easily responsive to voice. Relevant anatomy identified with ultrasound guidance. Local anesthetic given in 5cc increments with no signs or symptoms of intravascular injection. No pain or paraesthesias with injection. Patient monitored throughout procedure with signs of LAST or immediate complications. Tolerated well. Ultrasound image placed in chart.  Tawny Asal, MD

## 2021-10-02 ENCOUNTER — Encounter (HOSPITAL_COMMUNITY): Payer: Self-pay | Admitting: Orthopaedic Surgery

## 2021-10-02 ENCOUNTER — Other Ambulatory Visit: Payer: Medicare Other

## 2021-10-02 ENCOUNTER — Ambulatory Visit: Payer: Medicare Other | Admitting: Hematology & Oncology

## 2021-10-02 DIAGNOSIS — I2510 Atherosclerotic heart disease of native coronary artery without angina pectoris: Secondary | ICD-10-CM | POA: Diagnosis not present

## 2021-10-02 DIAGNOSIS — E1122 Type 2 diabetes mellitus with diabetic chronic kidney disease: Secondary | ICD-10-CM | POA: Diagnosis not present

## 2021-10-02 DIAGNOSIS — F419 Anxiety disorder, unspecified: Secondary | ICD-10-CM | POA: Diagnosis not present

## 2021-10-02 DIAGNOSIS — Z794 Long term (current) use of insulin: Secondary | ICD-10-CM | POA: Diagnosis not present

## 2021-10-02 DIAGNOSIS — Q666 Other congenital valgus deformities of feet: Secondary | ICD-10-CM | POA: Diagnosis not present

## 2021-10-02 DIAGNOSIS — S86112A Strain of other muscle(s) and tendon(s) of posterior muscle group at lower leg level, left leg, initial encounter: Secondary | ICD-10-CM | POA: Diagnosis not present

## 2021-10-02 DIAGNOSIS — M79672 Pain in left foot: Secondary | ICD-10-CM | POA: Diagnosis not present

## 2021-10-02 DIAGNOSIS — R262 Difficulty in walking, not elsewhere classified: Secondary | ICD-10-CM | POA: Diagnosis not present

## 2021-10-02 DIAGNOSIS — N183 Chronic kidney disease, stage 3 unspecified: Secondary | ICD-10-CM | POA: Diagnosis not present

## 2021-10-02 DIAGNOSIS — Z87891 Personal history of nicotine dependence: Secondary | ICD-10-CM | POA: Diagnosis not present

## 2021-10-02 DIAGNOSIS — G4730 Sleep apnea, unspecified: Secondary | ICD-10-CM | POA: Diagnosis not present

## 2021-10-02 DIAGNOSIS — M6281 Muscle weakness (generalized): Secondary | ICD-10-CM | POA: Diagnosis not present

## 2021-10-02 DIAGNOSIS — R2689 Other abnormalities of gait and mobility: Secondary | ICD-10-CM | POA: Diagnosis not present

## 2021-10-02 DIAGNOSIS — M1990 Unspecified osteoarthritis, unspecified site: Secondary | ICD-10-CM | POA: Diagnosis not present

## 2021-10-02 DIAGNOSIS — Z7984 Long term (current) use of oral hypoglycemic drugs: Secondary | ICD-10-CM | POA: Diagnosis not present

## 2021-10-02 DIAGNOSIS — I129 Hypertensive chronic kidney disease with stage 1 through stage 4 chronic kidney disease, or unspecified chronic kidney disease: Secondary | ICD-10-CM | POA: Diagnosis not present

## 2021-10-02 LAB — GLUCOSE, CAPILLARY
Glucose-Capillary: 174 mg/dL — ABNORMAL HIGH (ref 70–99)
Glucose-Capillary: 259 mg/dL — ABNORMAL HIGH (ref 70–99)
Glucose-Capillary: 252 mg/dL — ABNORMAL HIGH (ref 70–99)
Glucose-Capillary: 258 mg/dL — ABNORMAL HIGH (ref 70–99)

## 2021-10-02 MED ORDER — OXYCODONE HCL 5 MG PO TABS
5.0000 mg | ORAL_TABLET | ORAL | Status: DC | PRN
  Administered 2021-10-02 – 2021-10-03 (×6): 5 mg via ORAL
  Filled 2021-10-02 (×6): qty 1

## 2021-10-02 MED ORDER — PANTOPRAZOLE SODIUM 40 MG PO TBEC
40.0000 mg | DELAYED_RELEASE_TABLET | Freq: Every morning | ORAL | Status: DC
  Administered 2021-10-02 – 2021-10-03 (×2): 40 mg via ORAL
  Filled 2021-10-02 (×2): qty 1

## 2021-10-02 MED ORDER — ROSUVASTATIN CALCIUM 5 MG PO TABS
10.0000 mg | ORAL_TABLET | ORAL | Status: DC
Start: 2021-10-02 — End: 2021-10-03
  Administered 2021-10-02: 10 mg via ORAL
  Filled 2021-10-02: qty 2

## 2021-10-02 MED ORDER — RANOLAZINE ER 500 MG PO TB12
500.0000 mg | ORAL_TABLET | Freq: Two times a day (BID) | ORAL | Status: DC
  Administered 2021-10-02 – 2021-10-03 (×3): 500 mg via ORAL
  Filled 2021-10-02 (×4): qty 1

## 2021-10-02 MED ORDER — ACETAMINOPHEN 500 MG PO TABS
500.0000 mg | ORAL_TABLET | Freq: Four times a day (QID) | ORAL | Status: DC | PRN
  Administered 2021-10-02: 500 mg via ORAL
  Filled 2021-10-02: qty 1

## 2021-10-02 MED ORDER — SEMAGLUTIDE(0.25 OR 0.5MG/DOS) 2 MG/1.5ML ~~LOC~~ SOPN: 0.5000 mg | PEN_INJECTOR | SUBCUTANEOUS | Status: DC

## 2021-10-02 MED ORDER — ONDANSETRON HCL 4 MG PO TABS: 4.0000 mg | ORAL_TABLET | Freq: Three times a day (TID) | ORAL | Status: DC | PRN

## 2021-10-02 MED ORDER — NITROGLYCERIN 0.4 MG SL SUBL: 0.4000 mg | SUBLINGUAL_TABLET | SUBLINGUAL | Status: DC | PRN

## 2021-10-02 MED ORDER — TAMSULOSIN HCL 0.4 MG PO CAPS
0.8000 mg | ORAL_CAPSULE | Freq: Every day | ORAL | Status: DC
  Administered 2021-10-02 – 2021-10-03 (×2): 0.8 mg via ORAL
  Filled 2021-10-02 (×2): qty 2

## 2021-10-02 MED ORDER — GLIMEPIRIDE 2 MG PO TABS
2.0000 mg | ORAL_TABLET | Freq: Two times a day (BID) | ORAL | Status: DC
  Administered 2021-10-02 – 2021-10-03 (×3): 2 mg via ORAL
  Filled 2021-10-02 (×5): qty 1

## 2021-10-02 MED ORDER — INSULIN GLARGINE-YFGN 100 UNIT/ML ~~LOC~~ SOLN
40.0000 [IU] | Freq: Two times a day (BID) | SUBCUTANEOUS | Status: DC
  Administered 2021-10-02 – 2021-10-03 (×3): 40 [IU] via SUBCUTANEOUS
  Filled 2021-10-02 (×4): qty 0.4

## 2021-10-02 MED ORDER — METOPROLOL SUCCINATE ER 25 MG PO TB24
25.0000 mg | ORAL_TABLET | Freq: Every day | ORAL | Status: DC
  Administered 2021-10-02: 25 mg via ORAL
  Filled 2021-10-02: qty 1

## 2021-10-02 MED ORDER — ADULT MULTIVITAMIN W/MINERALS CH
1.0000 | ORAL_TABLET | Freq: Every day | ORAL | Status: DC
  Administered 2021-10-02 – 2021-10-03 (×2): 1 via ORAL
  Filled 2021-10-02 (×2): qty 1

## 2021-10-02 MED ORDER — ALPRAZOLAM 0.5 MG PO TABS: 1.0000 mg | ORAL_TABLET | Freq: Three times a day (TID) | ORAL | Status: DC | PRN

## 2021-10-02 MED ORDER — ROPINIROLE HCL 0.5 MG PO TABS
2.0000 mg | ORAL_TABLET | Freq: Every day | ORAL | Status: DC
  Administered 2021-10-02: 2 mg via ORAL
  Filled 2021-10-02: qty 4

## 2021-10-02 NOTE — TOC Initial Note (Addendum)
Transition of Care [facility]) - Initial/Assessment Note    Patient Details  Name: Isaac [name] MRN: [id number] Date of Birth: 01/19/1956  Transition of Care [facility]) CM/SW Contact:    Joanne Chars, LCSW Phone Number: 10/02/2021, 2:28 PM  Clinical Narrative:   CSW met with pt regarding DC recommendation for SNF.  Pt agreeable to this and reports he made efforts to set it up prior to his admission.  He has already spoken to Elyse Hsu at [facility] and believes they will admit him.  Pt lives alone, no current services.  Permission given to speak with sister Hassan Rowan.  Pt is vaccinated for covid with all 3 boosters.    Referral sent to [facility], Elyse Hsu will review.        1445: accepted by [facility].  Auth request submitted in [place].          Expected Discharge Plan: Skilled Nursing Facility Barriers to Discharge: Continued Medical Work up, SNF Pending bed offer   Patient Goals and CMS Choice Patient states their goals for this hospitalization and ongoing recovery are:: be more mobile CMS Medicare.gov Compare Post Acute Care list provided to:: Patient Choice offered to / list presented to : Patient  Expected Discharge Plan and Services Expected Discharge Plan: [place] In-house Referral: Clinical Social Work   Post Acute Care Choice: [place] Living arrangements for the past 2 months: [place]                                      Prior Living Arrangements/Services Living arrangements for the past 2 months: Single Family Home Lives with:: Self Patient language and need for interpreter reviewed:: Yes Do you feel safe going back to the place where you live?: Yes      Need for Family Participation in Patient Care: No (Comment) Care giver support system in place?: Yes (comment) Current home services: Other (comment) (none) Criminal Activity/Legal Involvement Pertinent to Current Situation/Hospitalization:  No - Comment as needed  Activities of Daily Living Home Assistive Devices/Equipment: Eyeglasses ADL Screening (condition at time of admission) Patient's cognitive ability adequate to safely complete daily activities?: Yes Is the patient deaf or have difficulty hearing?: Yes Does the patient have difficulty seeing, even when wearing glasses/contacts?: No Does the patient have difficulty concentrating, remembering, or making decisions?: No Patient able to express need for assistance with ADLs?: Yes Does the patient have difficulty dressing or bathing?: No Independently performs ADLs?: Yes (appropriate for developmental age) Does the patient have difficulty walking or climbing stairs?: Yes Weakness of Legs: Both Weakness of Arms/Hands: None  Permission Sought/Granted Permission sought to share information with : Family Supports Permission granted to share information with : Yes, Verbal Permission Granted  Share Information with NAME: sister Hassan Rowan  Permission granted to share info w AGENCY: SNF[facility]        Emotional Assessment Appearance:: Appears stated age Attitude/Demeanor/Rapport: Engaged Affect (typically observed): Appropriate, Pleasant Orientation: : Oriented to Self, Oriented to Place, Oriented to  Time, Oriented to Situation Alcohol / Substance Use: Not Applicable Psych Involvement: No (comment)  Admission diagnosis:  Left foot pain [M79.672] Patient Active Problem List   Diagnosis Date Noted   Left foot pain 10/01/2021   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/09/2020 03/09/2020   Elevated serum creatinine 03/21/2018   IDA (iron deficiency anemia) 06/09/2017   Cancer of renal pelvis, right (  [place]) 03/18/2017   Goals of care, counseling/discussion 03/18/2017   Non-ST elevation MI (NSTEMI) ([place]) 10/18/2016   Peripheral neuropathy    OSA on CPAP    GERD (gastroesophageal reflux disease)    Type 2 diabetes mellitus ([place])    Bladder cancer ([place])     Gout 10/22/2015   History of adenomatous polyp of colon 09/07/2013   Anxiety and depression 06/19/2011   History of skin cancer 06/19/2011   Hyperlipidemia 05/10/2008   CAD, NATIVE VESSEL 05/10/2008   RESTLESS LEGS SYNDROME 11/17/2007   Essential hypertension 11/16/2007   PCP:  Elby Showers, MD Pharmacy:   [facility] [place], [place] [address] Phone: [phone] Fax: [place] [address] [place] [address] Phone: [phone] Fax: [phone]     Social Determinants of Health (SDOH) Interventions    Readmission Risk Interventions     View : No data to display.

## 2021-10-02 NOTE — NC FL2 (Signed)
[place] LEVEL OF CARE SCREENING TOOL     IDENTIFICATION  Patient Name: Isaac [name] Birthdate: Feb 03, 1956 Sex: male Admission Date (Current Location): 10/01/2021  [facility] and [place] Number:  Herbalist and Address:  [facility], [place] [address] [place], [place] [address]      Provider Number: [id number]  Attending Physician Name and Address:  Erle Crocker, MD  Relative Name and Phone Number:  O'Reilly,Brenda Sister [phone]    Current Level of Care: Hospital Recommended Level of Care: [place] Prior Approval Number:    Date Approved/Denied:   PASRR Number: [id number]  Discharge Plan: SNF    Current Diagnoses: Patient Active Problem List   Diagnosis Date Noted   Left foot pain 10/01/2021   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/09/2020 03/09/2020   Elevated serum creatinine 03/21/2018   IDA (iron deficiency anemia) 06/09/2017   Cancer of renal pelvis, right ([place]) 03/18/2017   Goals of care, counseling/discussion 03/18/2017   Non-ST elevation MI (NSTEMI) ([place]) 10/18/2016   Peripheral neuropathy    OSA on CPAP    GERD (gastroesophageal reflux disease)    Type 2 diabetes mellitus (HCC)    Bladder cancer (HCC)    Gout 10/22/2015   History of adenomatous polyp of colon 09/07/2013   Anxiety and depression 06/19/2011   History of skin cancer 06/19/2011   Hyperlipidemia 05/10/2008   CAD, NATIVE VESSEL 05/10/2008   RESTLESS LEGS SYNDROME 11/17/2007   Essential hypertension 11/16/2007    Orientation RESPIRATION BLADDER Height & Weight     Self, Time, Situation, Place  Normal Continent Weight: 219 lb (99.3 kg) Height:  '5\' 10"'$  (177.8 cm)  BEHAVIORAL SYMPTOMS/MOOD NEUROLOGICAL BOWEL NUTRITION STATUS      Continent Diet (see discharge summary)  AMBULATORY STATUS COMMUNICATION OF NEEDS Skin   Limited Assist Verbally Surgical wounds                        Personal Care Assistance Level of Assistance  Bathing, Feeding, Dressing Bathing Assistance: Limited assistance Feeding assistance: Independent Dressing Assistance: Limited assistance     Functional Limitations Info  Sight, Hearing, Speech Sight Info: Adequate Hearing Info: Adequate Speech Info: Adequate    SPECIAL CARE FACTORS FREQUENCY  PT (By licensed PT), OT (By licensed OT)     PT Frequency: 5x week OT Frequency: 5x week            Contractures Contractures Info: Not present    Additional Factors Info  Code Status, Allergies Code Status Info: full Allergies Info: Nsaids           Current Medications (10/02/2021):  This is the current hospital active medication list Current Facility-Administered Medications  Medication Dose Route Frequency Provider Last Rate Last Admin   acetaminophen (TYLENOL) tablet 500 mg  500 mg Oral Q6H PRN Martinique, Jesse J, PA-C       acetaminophen (TYLENOL) tablet 500 mg  500 mg Oral Q6H Martinique, Jesse J, PA-C   500 mg at 10/02/21 0503   ALPRAZolam Duanne Moron) tablet 1 mg  1 mg Oral TID PRN Martinique, Jesse J, PA-C       diphenhydrAMINE (BENADRYL) 12.5 MG/5ML elixir 12.5-25 mg  12.5-25 mg Oral Q4H PRN Martinique, Jesse J, PA-C       docusate sodium (COLACE) capsule 100 mg  100 mg Oral BID Martinique, Jesse J, PA-C   100 mg at 10/02/21 0943   enoxaparin (LOVENOX)  injection 40 mg  40 mg Subcutaneous Q24H Martinique, Jesse J, [place]   40 mg at 10/02/21 0945   glimepiride (AMARYL) tablet 2 mg  2 mg Oral BID WC Martinique, Jesse J, [place]   2 mg at 10/02/21 0258   insulin glargine-yfgn (SEMGLEE) injection 40 Units  40 Units Subcutaneous BID Martinique, Jesse J, [place]   40 Units at 10/02/21 0945   methocarbamol (ROBAXIN) tablet 500 mg  500 mg Oral Q6H PRN Martinique, Jesse J, PA-C   500 mg at 10/02/21 5277   Or   methocarbamol (ROBAXIN) 500 mg in dextrose 5 % 50 mL IVPB  500 mg Intravenous Q6H PRN Martinique, Jesse J, PA-C       metoCLOPramide (REGLAN) tablet 5-10 mg  5-10 mg Oral  Q8H PRN Martinique, Jesse J, PA-C       Or   metoCLOPramide (REGLAN) injection 5-10 mg  5-10 mg Intravenous Q8H PRN Martinique, Jesse J, PA-C       metoprolol succinate (TOPROL-XL) 24 hr tablet 25 mg  25 mg Oral QHS Martinique, Jesse J, PA-C       morphine (PF) 2 MG/ML injection 0.5-1 mg  0.5-1 mg Intravenous Q2H PRN Martinique, Jesse J, PA-C   1 mg at 10/02/21 8242   multivitamin with minerals tablet 1 tablet  1 tablet Oral Daily Martinique, Jesse J, [place]   1 tablet at 10/02/21 3536   nitroGLYCERIN (NITROSTAT) SL tablet 0.4 mg  0.4 mg Sublingual Q5 min PRN Martinique, Jesse J, PA-C       ondansetron [facility]) tablet 4 mg  4 mg Oral Q8H PRN Martinique, Jesse J, PA-C       oxyCODONE (Oxy IR/ROXICODONE) immediate release tablet 5 mg  5 mg Oral Q4H PRN Martinique, Jesse J, PA-C   5 mg at 10/02/21 1330   pantoprazole (PROTONIX) EC tablet 40 mg  40 mg Oral q morning Martinique, Jesse J, PA-C   40 mg at 10/02/21 0944   ranolazine (RANEXA) 12 hr tablet 500 mg  500 mg Oral BID Martinique, Jesse J, PA-C   500 mg at 10/02/21 1443   rOPINIRole (REQUIP) tablet 2 mg  2 mg Oral QHS Martinique, Jesse J, PA-C       rosuvastatin (CRESTOR) tablet 10 mg  10 mg Oral QODAY Martinique, Jesse J, PA-C   10 mg at 10/02/21 1540   tamsulosin (FLOMAX) capsule 0.8 mg  0.8 mg Oral Daily Martinique, Jesse J, PA-C   0.8 mg at 10/02/21 0867   Facility-Administered Medications Ordered in Other Encounters  Medication Dose Route Frequency Provider Last Rate Last Admin   LORazepam (ATIVAN) injection 0.5 mg  0.5 mg Intravenous Once Celso Amy, NP       sodium chloride flush (NS) 0.9 % injection 10 mL  10 mL Intravenous PRN [name], MD   10 mL at 06/08/17 1150   sodium chloride flush (NS) 0.9 % injection 10 mL  10 mL Intravenous PRN [name], MD   10 mL at 06/04/21 1153     Discharge Medications: Please see discharge summary for a list of discharge medications.  Relevant Imaging Results:  Relevant Lab Results:   Additional Information SSN [id number], Pt  is vaccinated for covid with 3 boosters.  Joanne Chars, LCSW

## 2021-10-02 NOTE — Evaluation (Signed)
Occupational Therapy Evaluation Patient Details Name: Isaac [name] MRN: [id number] DOB: [birth year] Today's Date: 10/02/2021   History of Present Illness Patient is a 66 yo male presenting 10/01/21 to the hospital status post left medial displacement calcaneal osteotomy, posterior tibial tendon reconstruction,  and calcaneonavicular ligament reconstruction. PMH includes: HTN, CAD, PVD, anxiety, renal insufficiency   Clinical Impression   Prior to this admission, patient lived independently and required no assist for ADLs or IADLs. Patient endorsed he still drives and manages his own medications. Currently, patient presenting with decreased activity tolerance and need for increased assist with ADLs. Patient is mod A for ADLs and is min A for step/slide pivot transfers (unable to hop in session to date). Patient also needing moderate cues to keep LLE off the ground however no weight noted to go through LLE. OT recommending short term SNF rehab as patient lives alone and does not have assistance that can provide the current level of care he requires. OT to continue to follow acutely.      Recommendations for follow up therapy are one component of a multi-disciplinary discharge planning process, led by the attending physician.  Recommendations may be updated based on patient status, additional functional criteria and insurance authorization.   Follow Up Recommendations  Skilled nursing-short term rehab (<3 hours/day)    Assistance Recommended at Discharge Set up Supervision/Assistance  Patient can return home with the following A lot of help with walking and/or transfers;A lot of help with bathing/dressing/bathroom;Assistance with cooking/housework;Assist for transportation;Help with stairs or ramp for entrance    Functional Status Assessment  Patient has had a recent decline in their functional status and demonstrates the ability to make significant improvements in function in a reasonable and  predictable amount of time.  Equipment Recommendations  Other (comment) (Defer to next venue)    Recommendations for Other Services       Precautions / Restrictions Precautions Precautions: Fall Restrictions Weight Bearing Restrictions: Yes LLE Weight Bearing: Non weight bearing      Mobility Bed Mobility Overal bed mobility: Needs Assistance Bed Mobility: Sit to Supine       Sit to supine: Min assist   General bed mobility comments: Min A solely to bring LLE back into bed    Transfers Overall transfer level: Needs assistance Equipment used: Rolling walker (2 wheels) Transfers: Bed to chair/wheelchair/BSC Sit to Stand: Min assist     Step pivot transfers: Min assist     General transfer comment: Pt needing cues to keep L foot off floor and to transition hands from recliner to RW with transfer to stand, minA to power up and steady. MinA to steady and direct RW, Needs cues to maintain NWB on L, impulsively standing when attempting return to bed with OT      Balance Overall balance assessment: Needs assistance Sitting-balance support: No upper extremity supported, Feet supported Sitting balance-Leahy Scale: Fair     Standing balance support: Bilateral upper extremity supported, During functional activity, Reliant on assistive device for balance Standing balance-Leahy Scale: Poor Standing balance comment: Reliant on RW and minA                           ADL either performed or assessed with clinical judgement   ADL Overall ADL's : Needs assistance/impaired Eating/Feeding: Set up;Sitting   Grooming: Set up;Sitting   Upper Body Bathing: Sitting;Set up   Lower Body Bathing: Moderate assistance;Maximal assistance;Sitting/lateral leans;Sit to/from stand   Upper  Body Dressing : Set up;Sitting Upper Body Dressing Details (indicate cue type and reason): donning gown Lower Body Dressing: Moderate assistance;Maximal assistance;Sitting/lateral leans    Toilet Transfer: Minimal assistance;Stand-pivot;BSC/3in1 Toilet Transfer Details (indicate cue type and reason): unable to progress with hopping to date Toileting- Clothing Manipulation and Hygiene: Minimal assistance;Sitting/lateral lean;Sit to/from stand Toileting - Clothing Manipulation Details (indicate cue type and reason): for thoroughness     Functional mobility during ADLs: Moderate assistance;Rolling walker (2 wheels);Cueing for safety;Cueing for sequencing General ADL Comments: Patient presenting with pain, decreased activity tolerance and need for increased assist with ADLs     Vision Baseline Vision/History: 0 No visual deficits Ability to See in Adequate Light: 0 Adequate Patient Visual Report: No change from baseline       Perception     Praxis      Pertinent Vitals/Pain Pain Assessment Pain Assessment: 0-10 Pain Score: 10-Worst pain ever Pain Location: L ankle/foot Pain Descriptors / Indicators: Discomfort, Grimacing, Operative site guarding Pain Intervention(s): Limited activity within patient's tolerance, Monitored during session, RN gave pain meds during session, Relaxation, Utilized relaxation techniques     Hand Dominance     Extremity/Trunk Assessment Upper Extremity Assessment Upper Extremity Assessment: Overall WFL for tasks assessed   Lower Extremity Assessment Lower Extremity Assessment: Defer to PT evaluation   Cervical / Trunk Assessment Cervical / Trunk Assessment: Normal   Communication Communication Communication: No difficulties   Cognition Arousal/Alertness: Awake/alert Behavior During Therapy: WFL for tasks assessed/performed Overall Cognitive Status: Within Functional Limits for tasks assessed                                 General Comments: Minimally impulsive due to pain when completing stand pivot transfers     General Comments       Exercises     Shoulder Instructions      Home Living Family/patient  expects to be discharged to:: Private residence Living Arrangements: Alone Available Help at Discharge: Friend(s);Available PRN/intermittently (very little help available) Type of Home: House Home Access: Stairs to enter [employer] of Steps: 2 Entrance Stairs-Rails: None Home Layout: One level;Laundry or work area in basement (L rail ascending from basement on stairs)     Bathroom Shower/Tub: Teacher, early years/pre: Standard Bathroom Accessibility: No   Home Equipment: Conservation officer, nature (2 wheels);Crutches          Prior Functioning/Environment Prior Level of Function : Independent/Modified Independent;Driving                        OT Problem List: Decreased activity tolerance;Impaired balance (sitting and/or standing);Decreased coordination;Decreased safety awareness;Decreased knowledge of use of DME or AE;Decreased knowledge of precautions;Pain      OT Treatment/Interventions: Self-care/ADL training;Therapeutic exercise;Energy conservation;DME and/or AE instruction;Manual therapy;Patient/family education;Balance training;Therapeutic activities    OT Goals(Current goals can be found in the care plan section) Acute Rehab OT Goals Patient Stated Goal: to get back to living independently OT Goal Formulation: With patient Time For Goal Achievement: 10/16/21 Potential to Achieve Goals: Good ADL Goals Pt Will Perform Lower Body Bathing: with set-up;with adaptive equipment;sit to/from stand;sitting/lateral leans Pt Will Perform Lower Body Dressing: with set-up;with adaptive equipment;sitting/lateral leans;sit to/from stand Pt Will Transfer to Toilet: ambulating;grab bars Pt Will Perform Toileting - Clothing Manipulation and hygiene: Independently Additional ADL Goal #1: Patient will be able to verbalize 3 strategies for fall prevention and energy conservation for  safe discharge home.  OT Frequency: Min 2X/week    Co-evaluation               AM-PAC OT "6 Clicks" Daily Activity     Outcome Measure Help from another person eating meals?: A Little Help from another person taking care of personal grooming?: A Little Help from another person toileting, which includes using toliet, bedpan, or urinal?: A Little Help from another person bathing (including washing, rinsing, drying)?: A Lot Help from another person to put on and taking off regular upper body clothing?: A Little Help from another person to put on and taking off regular lower body clothing?: A Lot 6 Click Score: 16   End of Session Equipment Utilized During Treatment: Rolling walker (2 wheels) Nurse Communication: Mobility status  Activity Tolerance: Patient tolerated treatment well;Patient limited by pain Patient left: in bed;with call bell/phone within reach  OT Visit Diagnosis: Unsteadiness on feet (R26.81);Other abnormalities of gait and mobility (R26.89);Muscle weakness (generalized) (M62.81);Pain Pain - Right/Left: Left Pain - part of body: Leg                Time: 3159-4585 OT Time Calculation (min): 22 min Charges:  OT General Charges $OT Visit: 1 Visit OT Evaluation $OT Eval Moderate Complexity: 1 Mod  Corinne Ports E. Shaila Gilchrest, OTR/L Acute Rehabilitation Services [phone] [place] 10/02/2021, 2:14 PM

## 2021-10-02 NOTE — Progress Notes (Signed)
     Isaac [name] is a 66 y.o. male   Orthopaedic diagnosis: Left foot posterior tibial tendon rupture with resultant hindfoot valgus   Surgery 10/01/21: Left foot medial slide calcaneal osteotomy with posterior tibial tendon reconstruction and FDL transfer.   Subjective: Patient appears comfortable in bed.  He is eating.  He states the nerve block remains intact, he is feeling some sensation return.  He is unable to wiggle his toes yet.  He has remained nonweightbearing.  He reminds me that he lives alone, is requesting to go to a skilled nursing facility postoperatively.  Objectyive: Vitals:   10/02/21 0438 10/02/21 0806  BP: (!) 157/85 (!) 146/79  Pulse: 71 80  Resp: 18   Temp: 97.7 F (36.5 C) (!) 97.4 F (36.3 C)  SpO2: 96% 96%     Exam: Awake and alert Respirations even and unlabored No acute distress  Left foot shows clean, dry, and intact lower extremity splint.  No significant tenderness to palpation proximal or distal to the splint.  He is unable to actively wiggle exposed toes.  Minimal sensation to light touch about the toes.  The toes are warm and well-perfused distally.  Range of motion at the knee is intact.  Assessment: Postop day 1 status post the above  Plan: -Nonweightbearing left lower extremity -Keep splint clean, dry, and intact -up with PT/OT today -Lovenox for DVT prophylaxis while inpatient, likely discharged on full-strength aspirin x1 month -Pain control as needed  Patient is requesting to go to a SNF postoperatively, as he lives alone and has done this with previous surgeries in the past.  [facility] [place] [facility] postoperative follow-up outpatient 2 weeks from surgery with Dr. [name] at [place] [phone] for splint removal, suture removal appropriate, x-rays, and likely placement of a nonweightbearing short leg cast.   Isaac Deakins J. Martinique, PA-C

## 2021-10-02 NOTE — Evaluation (Signed)
Physical Therapy Evaluation Patient Details Name: Isaac [name] MRN: [id number] DOB: 08/19/1955 Today's Date: 10/02/2021  History of Present Illness  Patient is a 66 yo male presenting 10/01/21 to the hospital status post left medial displacement calcaneal osteotomy, posterior tibial tendon reconstruction,  and calcaneonavicular ligament reconstruction. PMH includes: HTN, CAD, PVD, anxiety, renal insufficiency   Clinical Impression  Pt presents with condition above and deficits mentioned below, see PT Problem List. PTA, he was IND without DME living alone in a house in which he needs to access his basement. Currently, pt is limited in mobility by L foot/ankle pain, his NWB precautions on his L foot, balance deficits, L toes/foot sensation deficits, gross weakness, and decreased activity tolerance. Pt is requiring minA for basic transfers with a RW and was unable to progress gait further than just transferring bed to recliner due to difficulty maintain L leg NWB and advancing his R foot with heavy UE reliance on his RW. As pt has minimal to no support available at home and is unable to safely navigate his home alone at this time, recommending short-term rehab at a SNF. He reports he prefers the SNF with [facility] in [place]. Will continue to follow acutely.     Recommendations for follow up therapy are one component of a multi-disciplinary discharge planning process, led by the attending physician.  Recommendations may be updated based on patient status, additional functional criteria and insurance authorization.  Follow Up Recommendations Skilled nursing-short term rehab (<3 hours/day) (prefers [facility] in [place])    Assistance Recommended at Discharge Frequent or constant Supervision/Assistance  Patient can return home with the following  A lot of help with walking and/or transfers;A little help with bathing/dressing/bathroom;Assistance  with cooking/housework;Assist for transportation;Help with stairs or ramp for entrance    Equipment Recommendations Other (comment);BSC/3in1;Wheelchair (measurements PT);Wheelchair cushion (measurements PT) (tub bench)  Recommendations for Other Services       Functional Status Assessment Patient has had a recent decline in their functional status and demonstrates the ability to make significant improvements in function in a reasonable and predictable amount of time.     Precautions / Restrictions Precautions Precautions: Fall Restrictions Weight Bearing Restrictions: Yes LLE Weight Bearing: Non weight bearing      Mobility  Bed Mobility Overal bed mobility: Needs Assistance Bed Mobility: Supine to Sit     Supine to sit: Min guard, HOB elevated     General bed mobility comments: Extra time, HOB elevated, min guard for safety with use of rails    Transfers Overall transfer level: Needs assistance Equipment used: Rolling walker (2 wheels) Transfers: Bed to chair/wheelchair/BSC, Sit to/from Stand Sit to Stand: Min assist   Step pivot transfers: Min assist       General transfer comment: Pt needing cues to keep L foot off floor and to transition hands from bed to RW with transfer to stand, minA to power up and steady. MinA to steady and direct RW with stand step to L bed > recliner, with pt sliding R foot rather than hopping. Needs cues to maintain NWB on L    Ambulation/Gait Ambulation/Gait assistance: Min assist Gait Distance (Feet): 1 Feet Assistive device: Rolling walker (2 wheels) Gait Pattern/deviations:  (step-to) Gait velocity: reduced Gait velocity interpretation: <1.31 ft/sec, indicative of household ambulator   General Gait Details: Pt sliding R foot slowly with heavy reliance on RW to "step" to L bed > recliner, minA to manage RW and stabilize pt.  Stairs            Wheelchair Mobility    Modified Rankin (Stroke Patients Only)       Balance  Overall balance assessment: Needs assistance Sitting-balance support: No upper extremity supported, Feet supported Sitting balance-Leahy Scale: Fair     Standing balance support: Bilateral upper extremity supported, During functional activity, Reliant on assistive device for balance Standing balance-Leahy Scale: Poor Standing balance comment: Reliant on RW and minA                             Pertinent Vitals/Pain Pain Assessment Pain Assessment: 0-10 Pain Score: 10-Worst pain ever ("12") Pain Location: L ankle/foot Pain Descriptors / Indicators: Discomfort, Grimacing, Operative site guarding Pain Intervention(s): Limited activity within patient's tolerance, Monitored during session, Repositioned, RN gave pain meds during session    [place] expects to be discharged to:: Private residence Living Arrangements: Alone Available Help at Discharge: Friend(s);Available PRN/intermittently (very little help available) Type of Home: House Home Access: Stairs to enter Entrance Stairs-Rails: None Entrance Stairs-Number of Steps: 2   Home Layout: One level;Laundry or work area in basement Merchant navy officer in basement, L rail ascending from basement on stairs) Home Equipment: Conservation officer, nature (2 wheels);Crutches      Prior Function Prior Level of Function : Independent/Modified Independent;Driving                     Hand Dominance        Extremity/Trunk Assessment   Upper Extremity Assessment Upper Extremity Assessment: Defer to OT evaluation    Lower Extremity Assessment Lower Extremity Assessment: LLE deficits/detail LLE Deficits / Details: Unable to wiggle toes or detect sensation in toes yet (RN reporting nerve block); ankle appears to be set to rest in inversion in cast LLE Sensation: decreased light touch    Cervical / Trunk Assessment Cervical / Trunk Assessment: Normal  Communication   Communication: No difficulties  Cognition  Arousal/Alertness: Awake/alert Behavior During Therapy: WFL for tasks assessed/performed Overall Cognitive Status: Within Functional Limits for tasks assessed                                          General Comments General comments (skin integrity, edema, etc.): educated pt to keep L foot elevated when at rest to reduce edema    Exercises     Assessment/Plan    PT Assessment Patient needs continued PT services  PT Problem List Decreased strength;Decreased range of motion;Decreased activity tolerance;Decreased balance;Decreased mobility;Decreased knowledge of precautions;Impaired sensation;Pain       PT Treatment Interventions DME instruction;Gait training;Stair training;Functional mobility training;Therapeutic activities;Therapeutic exercise;Balance training;Neuromuscular re-education;Patient/family education    PT Goals (Current goals can be found in the Care Plan section)  Acute Rehab PT Goals Patient Stated Goal: to go to SNF for rehab PT Goal Formulation: With patient Time For Goal Achievement: 10/16/21 Potential to Achieve Goals: Good    Frequency Min 3X/week     Co-evaluation               AM-PAC PT "6 Clicks" Mobility  Outcome Measure Help needed turning from your back to your side while in a flat bed without using bedrails?: A Little Help needed moving from lying on your back to sitting on the side of a flat bed without using bedrails?: A Little Help needed  moving to and from a bed to a chair (including a wheelchair)?: A Little Help needed standing up from a chair using your arms (e.g., wheelchair or bedside chair)?: A Little Help needed to walk in hospital room?: Total Help needed climbing 3-5 steps with a railing? : Total 6 Click Score: 14    End of Session Equipment Utilized During Treatment: Gait belt Activity Tolerance: Patient tolerated treatment well Patient left: in chair;with call bell/phone within reach Nurse Communication:  Mobility status;Other (comment) (RN reporting no need for chair alarm pad) PT Visit Diagnosis: Unsteadiness on feet (R26.81);Other abnormalities of gait and mobility (R26.89);Muscle weakness (generalized) (M62.81);Difficulty in walking, not elsewhere classified (R26.2);Pain Pain - Right/Left: Left Pain - part of body: Ankle and joints of foot    Time: 0940-1004 PT Time Calculation (min) (ACUTE ONLY): 24 min   Charges:   PT Evaluation $PT Eval Moderate Complexity: 1 Mod PT Treatments $Therapeutic Activity: 8-22 mins        Moishe Spice, PT, DPT Acute Rehabilitation Services  Pager: [phone] Office: [place] 10/02/2021, 10:18 AM

## 2021-10-03 DIAGNOSIS — Z794 Long term (current) use of insulin: Secondary | ICD-10-CM | POA: Diagnosis not present

## 2021-10-03 DIAGNOSIS — I129 Hypertensive chronic kidney disease with stage 1 through stage 4 chronic kidney disease, or unspecified chronic kidney disease: Secondary | ICD-10-CM | POA: Diagnosis not present

## 2021-10-03 DIAGNOSIS — R41841 Cognitive communication deficit: Secondary | ICD-10-CM | POA: Diagnosis not present

## 2021-10-03 DIAGNOSIS — G4733 Obstructive sleep apnea (adult) (pediatric): Secondary | ICD-10-CM | POA: Diagnosis not present

## 2021-10-03 DIAGNOSIS — Z87891 Personal history of nicotine dependence: Secondary | ICD-10-CM | POA: Diagnosis not present

## 2021-10-03 DIAGNOSIS — F32A Depression, unspecified: Secondary | ICD-10-CM | POA: Diagnosis not present

## 2021-10-03 DIAGNOSIS — R2689 Other abnormalities of gait and mobility: Secondary | ICD-10-CM | POA: Diagnosis not present

## 2021-10-03 DIAGNOSIS — N189 Chronic kidney disease, unspecified: Secondary | ICD-10-CM | POA: Diagnosis not present

## 2021-10-03 DIAGNOSIS — H93293 Other abnormal auditory perceptions, bilateral: Secondary | ICD-10-CM | POA: Diagnosis not present

## 2021-10-03 DIAGNOSIS — Q666 Other congenital valgus deformities of feet: Secondary | ICD-10-CM | POA: Diagnosis not present

## 2021-10-03 DIAGNOSIS — I2510 Atherosclerotic heart disease of native coronary artery without angina pectoris: Secondary | ICD-10-CM | POA: Diagnosis not present

## 2021-10-03 DIAGNOSIS — R5381 Other malaise: Secondary | ICD-10-CM | POA: Diagnosis not present

## 2021-10-03 DIAGNOSIS — R609 Edema, unspecified: Secondary | ICD-10-CM | POA: Diagnosis not present

## 2021-10-03 DIAGNOSIS — Z741 Need for assistance with personal care: Secondary | ICD-10-CM | POA: Diagnosis not present

## 2021-10-03 DIAGNOSIS — M6281 Muscle weakness (generalized): Secondary | ICD-10-CM | POA: Diagnosis not present

## 2021-10-03 DIAGNOSIS — R2681 Unsteadiness on feet: Secondary | ICD-10-CM | POA: Diagnosis not present

## 2021-10-03 DIAGNOSIS — G4730 Sleep apnea, unspecified: Secondary | ICD-10-CM | POA: Diagnosis not present

## 2021-10-03 DIAGNOSIS — N183 Chronic kidney disease, stage 3 unspecified: Secondary | ICD-10-CM | POA: Diagnosis not present

## 2021-10-03 DIAGNOSIS — F411 Generalized anxiety disorder: Secondary | ICD-10-CM | POA: Diagnosis not present

## 2021-10-03 DIAGNOSIS — R262 Difficulty in walking, not elsewhere classified: Secondary | ICD-10-CM | POA: Diagnosis not present

## 2021-10-03 DIAGNOSIS — E1122 Type 2 diabetes mellitus with diabetic chronic kidney disease: Secondary | ICD-10-CM | POA: Diagnosis not present

## 2021-10-03 DIAGNOSIS — M1990 Unspecified osteoarthritis, unspecified site: Secondary | ICD-10-CM | POA: Diagnosis not present

## 2021-10-03 DIAGNOSIS — S86112A Strain of other muscle(s) and tendon(s) of posterior muscle group at lower leg level, left leg, initial encounter: Secondary | ICD-10-CM | POA: Diagnosis not present

## 2021-10-03 DIAGNOSIS — E785 Hyperlipidemia, unspecified: Secondary | ICD-10-CM | POA: Diagnosis not present

## 2021-10-03 DIAGNOSIS — E1169 Type 2 diabetes mellitus with other specified complication: Secondary | ICD-10-CM | POA: Diagnosis not present

## 2021-10-03 DIAGNOSIS — R601 Generalized edema: Secondary | ICD-10-CM | POA: Diagnosis not present

## 2021-10-03 DIAGNOSIS — R42 Dizziness and giddiness: Secondary | ICD-10-CM | POA: Diagnosis not present

## 2021-10-03 DIAGNOSIS — D509 Iron deficiency anemia, unspecified: Secondary | ICD-10-CM | POA: Diagnosis not present

## 2021-10-03 DIAGNOSIS — S86191D Other injury of other muscle(s) and tendon(s) of posterior muscle group at lower leg level, right leg, subsequent encounter: Secondary | ICD-10-CM | POA: Diagnosis not present

## 2021-10-03 DIAGNOSIS — K5900 Constipation, unspecified: Secondary | ICD-10-CM | POA: Diagnosis not present

## 2021-10-03 DIAGNOSIS — F419 Anxiety disorder, unspecified: Secondary | ICD-10-CM | POA: Diagnosis not present

## 2021-10-03 DIAGNOSIS — M2142 Flat foot [pes planus] (acquired), left foot: Secondary | ICD-10-CM | POA: Diagnosis not present

## 2021-10-03 DIAGNOSIS — Z7984 Long term (current) use of oral hypoglycemic drugs: Secondary | ICD-10-CM | POA: Diagnosis not present

## 2021-10-03 DIAGNOSIS — H9190 Unspecified hearing loss, unspecified ear: Secondary | ICD-10-CM | POA: Diagnosis not present

## 2021-10-03 DIAGNOSIS — M79672 Pain in left foot: Secondary | ICD-10-CM | POA: Diagnosis not present

## 2021-10-03 LAB — GLUCOSE, CAPILLARY
Glucose-Capillary: 95 mg/dL (ref 70–99)
Glucose-Capillary: 277 mg/dL — ABNORMAL HIGH (ref 70–99)

## 2021-10-03 MED ORDER — OXYCODONE HCL 5 MG PO TABS
5.0000 mg | ORAL_TABLET | ORAL | 0 refills | Status: DC | PRN
Start: 2021-10-03 — End: 2022-04-30

## 2021-10-03 MED ORDER — ASPIRIN 81 MG PO TBEC: 81.0000 mg | DELAYED_RELEASE_TABLET | Freq: Two times a day (BID) | ORAL | 0 refills | Status: AC

## 2021-10-03 NOTE — Progress Notes (Signed)
Physical Therapy Treatment Patient Details Name: Isaac [name] MRN: [id number] DOB: 19-Sep-1955 Today's Date: 10/03/2021   History of Present Illness Patient is a 66 yo male presenting 10/01/21 to the hospital status post left medial displacement calcaneal osteotomy, posterior tibial tendon reconstruction,  and calcaneonavicular ligament reconstruction. PMH includes: HTN, CAD, PVD, anxiety, renal insufficiency    PT Comments    Pt was seen for mobility on RW with instruction for safety of transition with NWB on LLE.  Pt is more comfortable now with maintaining NWB, notably having an issue with IV on R hand and using that UE for force.  Pt is getting up to take steps, to stand and to stretch L hip and knee with good control of RLE esp with minor cues.  Continue to recommend SNF to follow up rehab and pt may need WC, shower bench and discussed his potential use of knee scooter with PT.  Follow acutely as his stay permits.   Recommendations for follow up therapy are one component of a multi-disciplinary discharge planning process, led by the attending physician.  Recommendations may be updated based on patient status, additional functional criteria and insurance authorization.  Follow Up Recommendations  Skilled nursing-short term rehab (<3 hours/day)     Assistance Recommended at Discharge Frequent or constant Supervision/Assistance  Patient can return home with the following A lot of help with walking and/or transfers;A little help with bathing/dressing/bathroom;Assistance with cooking/housework;Assist for transportation;Help with stairs or ramp for entrance   Equipment Recommendations  Other (comment);BSC/3in1;Wheelchair (measurements PT);Wheelchair cushion (measurements PT)    Recommendations for Other Services       Precautions / Restrictions Precautions Precautions: Fall Precaution Comments: occas dizziness Restrictions Weight Bearing Restrictions: Yes LLE Weight Bearing: Non  weight bearing     Mobility  Bed Mobility Overal bed mobility: Needs Assistance Bed Mobility: Supine to Sit, Sit to Supine     Supine to sit: Min guard Sit to supine: Min guard        Transfers Overall transfer level: Needs assistance Equipment used: Rolling walker (2 wheels) Transfers: Sit to/from Stand Sit to Stand: Min guard           General transfer comment: using bed rail or walker for support, cues for security iwith R hand hurting from IV    Ambulation/Gait Ambulation/Gait assistance: Min guard, Min assist Gait Distance (Feet): 5 Feet Assistive device: Rolling walker (2 wheels)   Gait velocity: reduced Gait velocity interpretation: <1.31 ft/sec, indicative of household ambulator Pre-gait activities: standing balance ck General Gait Details: manuevering on RW   Stairs             Wheelchair Mobility    Modified Rankin (Stroke Patients Only)       Balance Overall balance assessment: Needs assistance Sitting-balance support: Single extremity supported, Bilateral upper extremity supported Sitting balance-Leahy Scale: Fair     Standing balance support: Bilateral upper extremity supported, During functional activity Standing balance-Leahy Scale: Poor                              Cognition Arousal/Alertness: Awake/alert Behavior During Therapy: WFL for tasks assessed/performed Overall Cognitive Status: Within Functional Limits for tasks assessed                                 General Comments: reminders about secure hand placement  Exercises General Exercises - Lower Extremity Gluteal Sets: AROM, Left, 10 reps Heel Slides: AROM, Left, 10 reps Hip ABduction/ADduction: AROM, Left, 10 reps    General Comments General comments (skin integrity, edema, etc.): reviewed issues of LLE pain and showed pt stretches from standing that reduce hip and knee stiffness on LLE      Pertinent Vitals/Pain Pain  Assessment Pain Assessment: Faces Faces Pain Scale: Hurts a little bit Pain Location: L ankle/foot Pain Descriptors / Indicators: Grimacing, Guarding Pain Intervention(s): Monitored during session, Repositioned    Home Living                          Prior Function            PT Goals (current goals can now be found in the care plan section) Acute Rehab PT Goals Patient Stated Goal: to go to SNF for rehab    Frequency    Min 3X/week      PT Plan Current plan remains appropriate    Co-evaluation              AM-PAC PT "6 Clicks" Mobility   Outcome Measure  Help needed turning from your back to your side while in a flat bed without using bedrails?: A Little Help needed moving from lying on your back to sitting on the side of a flat bed without using bedrails?: A Little Help needed moving to and from a bed to a chair (including a wheelchair)?: A Little Help needed standing up from a chair using your arms (e.g., wheelchair or bedside chair)?: A Little Help needed to walk in hospital room?: A Lot Help needed climbing 3-5 steps with a railing? : Total 6 Click Score: 15    End of Session Equipment Utilized During Treatment: Gait belt Activity Tolerance: Patient tolerated treatment well;Other (comment) ([place]) Patient left: in bed;with call bell/phone within reach Nurse Communication: Mobility status;Other (comment) PT Visit Diagnosis: Unsteadiness on feet (R26.81);Other abnormalities of gait and mobility (R26.89);Muscle weakness (generalized) (M62.81);Difficulty in walking, not elsewhere classified (R26.2);Pain Pain - Right/Left: Left Pain - part of body: Ankle and joints of foot     Time: 1201-1227 PT Time Calculation (min) (ACUTE ONLY): 26 min  Charges:  $Therapeutic Exercise: 8-22 mins $Therapeutic Activity: 8-22 mins Ramond Dial 10/03/2021, 1:48 PM  Mee Hives, PT PhD Acute Rehab Dept. Number: [place]

## 2021-10-03 NOTE — Plan of Care (Signed)
  Problem: Education: Goal: Knowledge of General Education information will improve Description: Including pain rating scale, medication(s)/side effects and non-pharmacologic comfort measures Outcome: Progressing   Problem: Health Behavior/Discharge Planning: Goal: Ability to manage health-related needs will improve Outcome: Progressing   Problem: Activity: Goal: Risk for activity intolerance will decrease Outcome: Progressing   Problem: Elimination: Goal: Will not experience complications related to bowel motility Outcome: Progressing Goal: Will not experience complications related to urinary retention Outcome: Progressing   Problem: Pain Managment: Goal: General experience of comfort will improve Outcome: Progressing   Problem: Safety: Goal: Ability to remain free from injury will improve Outcome: Progressing   Problem: Skin Integrity: Goal: Risk for impaired skin integrity will decrease Outcome: Progressing

## 2021-10-03 NOTE — TOC Transition Note (Addendum)
Transition of Care [facility]) - CM/SW Discharge Note   Patient Details  Name: ROLLIN KOTOWSKI MRN: [id number] Date of Birth: 1956-03-01  [facility] CM/SW Contact:  Joanne Chars, LCSW Phone Number: 10/03/2021, 12:59 PM   Clinical Narrative:   Pt discharging to [facility].  RN call [phone] for report.   Pt initially asked about having sister transport, sister then asked about other options, concerned about cost/insurance coverage.  Pt does not appear to need EMS transport, will go by [facility].  Final next level of care: Skilled Nursing Facility Barriers to Discharge: Barriers Resolved   Patient Goals and CMS Choice Patient states their goals for this hospitalization and ongoing recovery are:: be more mobile CMS Medicare.gov Compare Post Acute Care list provided to:: Patient Choice offered to / list presented to : Patient  Discharge Placement              Patient chooses bed at:  [facility]) Patient to be transferred to facility by: [facility] Name of family member notified: sister Hassan Rowan Patient and family notified of of transfer: 10/03/21  Discharge Plan and Services In-house Referral: Clinical Social Work   Post Acute Care Choice: [place]                               Social Determinants of Health (SDOH) Interventions     Readmission Risk Interventions     View : No data to display.

## 2021-10-03 NOTE — Progress Notes (Signed)
     Isaac [name] is a 66 y.o. male   Orthopaedic diagnosis: Left foot posterior tibial tendon rupture with resultant hindfoot valgus    Surgery 10/01/21: Left foot medial slide calcaneal osteotomy with posterior tibial tendon reconstruction and FDL transfer.   Subjective: Patient appears comfortable in bed.  He is eating and drinking well.  States pain is mostly controlled on current regimen.  He has maintained nonweightbearing with walker.  Sensation and motor function to the foot have returned.  He has been evaluated by physical therapy.  He is planning to go to a SNF today.  Family is present in the room.  No new concerns.  Objectyive: Vitals:   10/03/21 0420 10/03/21 0826  BP: 140/83 127/78  Pulse: 78 78  Resp: 19 17  Temp: (!) 97.5 F (36.4 C) 98.6 F (37 C)  SpO2: 96% 97%     Exam: Awake and alert Respirations even and unlabored No acute distress  Left foot shows clean, dry, intact lower extremity splint.  The splint was not removed.  No significant tenderness to palpation distal or proximal to the splint.  He is able to wiggle exposed toes.  Is intact sensation about the toes.  The toes are warm and well-perfused distally.  Range of motion at the knee is intact.  Exposed skin is benign.  Assessment: Postop day 2 status post above, doing well and suitable for disposition to SNF today   Plan: -Nonweightbearing left lower extremity  -Keep splint clean, dry, and intact -Continue PT/OT while in house and at SNF. -We discussed DVT prophylaxis while outpatient and he would like to do 81 mg twice a day for 30 days.  Prescription printed and placed in the patient's chart -Oxycodone 5 mg tablets every 4 hours as needed for postoperative pain. Prescription printed and placed in the patient's chart.  Plan for postoperative follow-up 2 weeks from surgery with Dr. [name] at [facility] [phone] for splint removal, suture removal appropriate, x-rays, and likely  placement of a nonweightbearing short leg cast.  All questions were answered.  Herma Uballe J. [place], PA-C

## 2021-10-03 NOTE — Discharge Instructions (Signed)

## 2021-10-03 NOTE — TOC Progression Note (Signed)
Transition of Care [facility]) - Progression Note    Patient Details  Name: Isaac [name] MRN: [id number] Date of Birth: October 18, 1955  Transition of Care [facility]) CM/SW Contact  Joanne Chars, LCSW Phone Number: 10/03/2021, 8:29 AM  Clinical Narrative:   Auth request approved in [place]: [id number], 3 days: 5/24-5/26.  MD informed.    Expected Discharge Plan: [place] Barriers to Discharge: Continued Medical Work up, SNF Pending bed offer  Expected Discharge Plan and Services Expected Discharge Plan: [place] In-house Referral: Clinical Social Work   Post Acute Care Choice: [place] Living arrangements for the past 2 months: Single Family Home                                       Social Determinants of Health (SDOH) Interventions    Readmission Risk Interventions     View : No data to display.

## 2021-10-03 NOTE — Discharge Summary (Signed)
Patient ID: Isaac [name] MRN: [id number] DOB/AGE: 66-Jun-1957 66 y.o.  Admit date: 10/01/2021 Discharge date: 10/03/2021  Admission Diagnoses:  Principal Problem:   Left foot pain   Discharge Diagnoses:  Same  Past Medical History:  Diagnosis Date   Arthritis    Bladder cancer [facility]) urologist-- dr Louis Meckel   11/ 2019 recurrent    Cancer of renal pelvis, right [facility])    oncologist-  dr [name]--  high grade papillary urothelial carcinoma, Stage III (T3 Nx M0) -- 02-12-2017  s/p  left nephrectomy--- completed chemotherapy 07-27-2017   Cataract    Chemotherapy-induced neuropathy ([place])    feet   CKD (chronic kidney disease) stage 3, GFR 30-59 ml/min (HCC)    Coronary artery disease    Coronary vasospasm ([place]) PER DR ROSS NOTE 03-22-2012--  INTER. 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/ reimplantation   Hyperlipidemia    Hypertension    Left renal artery stenosis (HCC)    Myocardial infarction ([place]) 2005   Nocturia more than twice per night    OSA on CPAP    Pneumonia    Renal artery stenosis (HCC)    Sleep apnea    Thrombocytopenia ([place])    Type 2 diabetes mellitus treated with insulin [facility])    endocrinologist-- dr Steffanie Dunn ([facility] in [place])  lov note in epic    Urgency of urination    Wears glasses    Wisdom teeth extracted     Surgeries: Procedure(s): LEFT FOOT MEDIAL DISPLACEMENT CALCANEAL OSTEOTOMY POSTERIOR TIBIAL TENDON RECONSTRUCTION CALCANEONAVICULAR LIGAMENT  RECONSTRUCTION POSSIBLE FLEXOR DIGITORUM LONGUS TENDON TRANSFER on 10/01/2021   Consultants:   Discharged Condition: Improved  Hospital Course: ERYN [name] is an 66 y.o. male who was admitted 10/01/2021 for operative treatment ofLeft foot pain. Patient has severe unremitting pain that affects sleep, daily activities, and work/hobbies. After pre-op clearance the patient was taken to the operating room on 10/01/2021 and underwent  Procedure(s): LEFT FOOT MEDIAL DISPLACEMENT CALCANEAL OSTEOTOMY POSTERIOR TIBIAL TENDON RECONSTRUCTION CALCANEONAVICULAR LIGAMENT RECONSTRUCTION POSSIBLE FLEXOR DIGITORUM LONGUS TENDON TRANSFER.    Patient was given perioperative antibiotics:  Anti-infectives (From admission, onward)    Start     Dose/Rate Route Frequency Ordered Stop   10/01/21 1700  ceFAZolin (ANCEF) IVPB 2g/100 mL premix        2 g 200 mL/hr over 30 Minutes Intravenous Every 6 hours 10/01/21 1540 10/02/21 0947   10/01/21 1415  sulfamethoxazole-trimethoprim (BACTRIM) 320 mg in dextrose 5 % 500 mL IVPB  Status:  Discontinued        320 mg 346.7 mL/hr over 90 Minutes Intravenous Every 8 hours 10/01/21 1400 10/01/21 1402   10/01/21 0800  ceFAZolin (ANCEF) IVPB 2g/100 mL premix        2 g 200 mL/hr over 30 Minutes Intravenous On call to O.R. 10/01/21 0747 10/01/21 1005        Patient was given sequential compression devices, early ambulation,  and chemoprophylaxis to prevent DVT.  Patient benefited maximally from hospital stay and there were no complications.    Recent vital signs: Patient Vitals for the past 24 hrs:  BP Temp Temp src Pulse Resp SpO2  10/03/21 0826 127/78 98.6 F (37 C) Oral 78 17 97 %  10/03/21 0420 140/83 (!) 97.5 F (36.4 C) -- 78 19 96 %  10/02/21 1941 128/75 98.1 F (36.7 C) -- 87 17 96 %  10/02/21 1454 139/77 97.6 F (36.4 C) Oral 88 16 98 %     Recent laboratory studies:  Recent Labs    10/01/21 1608  WBC 5.2  HGB 14.0  HCT 40.4  PLT 107*   CREATININE 2.13*     Discharge Medications:   Allergies as of 10/03/2021       Reactions   Nsaids Other (See Comments)   Has 1 kidney left        Medication List     TAKE these medications    acetaminophen 500 MG tablet Commonly known as: TYLENOL Take 500 mg by mouth every 6 (six) hours as needed for moderate pain or headache.   ALPRAZolam 1 MG tablet Commonly known as: XANAX Take 1 tablet (1 mg total) by mouth 3 (three) times daily as needed for anxiety.   aspirin EC 81 MG tablet Take 1 tablet (81 mg total) by mouth 2 (two) times daily. Swallow whole. What changed:  when to take this additional instructions   glimepiride 2 MG tablet Commonly known as: AMARYL TAKE 1 BY MOUTH DAILY BEFORE SUPPER What changed:  how much to take how to take this when to take this additional instructions   insulin glargine (1 Unit Dial) 300 UNIT/ML Solostar Pen Commonly known as: TOUJEO Inject 40 Units into the skin 2 (two) times daily.   metoprolol succinate 25 MG 24 hr tablet Commonly known as: TOPROL-XL TAKE 1 TABLET BY MOUTH AT BEDTIME. GENERIC EQUIVALENT FOR TOPROL-XL   multivitamin with minerals tablet Take 1 tablet by mouth daily.   nitroGLYCERIN 0.4 MG SL tablet Commonly known as: NITROSTAT Place 1 tablet (0.4 mg total) under the tongue every 5 (five) minutes as needed for chest pain. Repeat every 5 minute up to 3 doses if no relief call 911.   ondansetron 4 MG tablet Commonly known as: ZOFRAN Take 1 tablet (4 mg total) by mouth every 8 (eight) hours as needed.   oxyCODONE 5 MG immediate release tablet Commonly known as: Oxy IR/ROXICODONE Take 1 tablet (5 mg total) by mouth every 4 (four) hours as needed for moderate pain or severe pain.   Ozempic (0.25 or 0.5 MG/DOSE) 2 MG/1.5ML Sopn Generic drug: Semaglutide(0.25 or 0.'5MG'$ /DOS) Inject 0.5 mg into the skin once a week.   pantoprazole 40 MG tablet Commonly known as: PROTONIX Take 1 tablet (40 mg total) by  mouth every morning.   PRESCRIPTION MEDICATION Inhale into the lungs at bedtime. CPAP   PROBIOTIC PO Take 1 tablet by mouth daily.   ranolazine 500 MG 12 hr tablet Commonly known as: Ranexa Take 1 tablet (500 mg total) by mouth 2 (two) times daily.   rOPINIRole 2 MG tablet Commonly known as: REQUIP TAKE 1 TABLET BY MOUTH AT BEDTIME   rosuvastatin 10 MG tablet Commonly known as: CRESTOR Take 1 tablet (10 mg total) by mouth every other day.   tadalafil 20 MG tablet Commonly known as: CIALIS Take 1 tablet by mouth daily as needed   tamsulosin 0.4 MG Caps capsule Commonly  known as: FLOMAX Take 2 capsules by mouth everyday               Durable Medical Equipment  (From admission, onward)           Start     Ordered   10/01/21 1540  DME Walker rolling  Once       Question Answer Comment  Walker: With [place] Wheels   Patient needs a walker to treat with the following condition Left foot pain      10/01/21 1540              Discharge Care Instructions  (From admission, onward)           Start     Ordered   10/03/21 0000  Non weight bearing       Question Answer Comment  Laterality left   Extremity Lower      10/03/21 1211            Diagnostic Studies: DG MINI C-ARM IMAGE ONLY  Result Date: 10/01/2021 There is no interpretation for this exam.  This order is for images obtained during a surgical procedure.  Please See "Surgeries" Tab for more information regarding the procedure.    Disposition: Discharge disposition: 03-Skilled Nursing Facility     -Nonweightbearing left lower extremity  -Keep splint clean, dry, and intact -Continue PT/OT while in house and at SNF. -We discussed DVT prophylaxis while outpatient and he would like to do 81 mg twice a day for 30 days.  Prescription printed and placed in the patient's chart -Oxycodone 5 mg tablets every 4 hours as needed for postoperative pain. Prescription printed and placed in the  patient's chart.   Plan for postoperative follow-up 2 weeks from surgery with Dr. [name] at [facility] [phone] for splint removal, suture removal appropriate, x-rays, and likely placement of a nonweightbearing short leg cast.  All questions were answered.  Discharge Instructions     Call MD / Call 911   Complete by: As directed    If you experience chest pain or shortness of breath, CALL 911 and be transported to the hospital emergency room.  If you develope a fever above 101 F, pus (white drainage) or increased drainage or redness at the wound, or calf pain, call your surgeon's office.   Constipation Prevention   Complete by: As directed    Drink plenty of fluids.  Prune juice may be helpful.  You may use a stool softener, such as Colace (over the counter) 100 mg twice a day.  Use MiraLax (over the counter) for constipation as needed.   Diet - low sodium heart healthy   Complete by: As directed    Non weight bearing   Complete by: As directed    Laterality: left   Extremity: Lower   Post-operative opioid taper instructions:   Complete by: As directed    POST-OPERATIVE OPIOID TAPER INSTRUCTIONS: It is important to wean off of your opioid medication as soon as possible. If you do not need pain medication after your surgery it is ok to stop day one. Opioids include: Codeine, Hydrocodone(Norco, Vicodin), Oxycodone(Percocet, oxycontin) and hydromorphone amongst others.  Long term and even short term use of opiods can cause: Increased pain response Dependence Constipation Depression Respiratory depression And more.  Withdrawal symptoms can include Flu like symptoms Nausea, vomiting And more Techniques to manage these symptoms Hydrate well Eat regular healthy meals Stay active Use relaxation techniques(deep breathing, meditating, yoga) Do Not  substitute Alcohol to help with tapering If you have been on opioids for less than two weeks and do not have pain than it is ok  to stop all together.  Plan to wean off of opioids This plan should start within one week post op of your joint replacement. Maintain the same interval or time between taking each dose and first decrease the dose.  Cut the total daily intake of opioids by one tablet each day Next start to increase the time between doses. The last dose that should be eliminated is the evening dose.           Follow-up Information     Erle Crocker, MD Follow up in 2 week(s).   Specialty: Orthopedic Surgery Contact information: [place] [address] [phone]                  Signed: Casady Voshell J Martinique 10/03/2021, 12:24 PM

## 2021-10-03 NOTE — Plan of Care (Signed)

## 2021-10-03 NOTE — Progress Notes (Signed)
Inpatient Diabetes Program Recommendations  AACE/ADA: New Consensus Statement on Inpatient Glycemic Control (2015)  Target Ranges:  Prepandial:   less than 140 mg/dL      Peak postprandial:   less than 180 mg/dL (1-2 hours)      Critically ill patients:  140 - 180 mg/dL   Lab Results  Component Value Date   GLUCAP 95 10/03/2021   HGBA1C 7.3 (H) 09/24/2021    Review of Glycemic Control  Latest Reference Range & Units 10/02/21 08:08 10/02/21 11:26 10/02/21 16:22 10/02/21 19:42 10/03/21 08:13  Glucose-Capillary 70 - 99 mg/dL 174 (H) 259 (H) 252 (H) 258 (H) 95  (H): Data is abnormally high  Inpatient Diabetes Program Recommendations:   -Add Novolog correction 0-9 units tid + hs 0-5 units Secure chat sent to Dr. [name].  Thank you, Nani Gasser. Lachina Salsberry, RN, MSN, CDE  Diabetes Coordinator Inpatient Glycemic Control Team Team Pager [phone] (8am-5pm) 10/03/2021 10:57 AM

## 2021-10-04 NOTE — Op Note (Signed)
Isaac [name] male 66 y.o. 10/01/2021  PreOperative Diagnosis: Left foot posterior tibial tendon rupture Symptomatic left foot pes planovalgus  PostOperative Diagnosis: same  PROCEDURE: Left foot medial displacement calcaneal osteotomy Posterior tibial tendon reconstruction Calcaneonavicular ligament reconstruction Flexor digitorum longus deep tendon transfer  SURGEON: Melony Overly, MD  ASSISTANT: Jesse Martinique, PA-C was necessary for patient positioning, prep, drape, assistance with retraction and exposure as well as placement of hardware  ANESTHESIA: General with peripheral nerve blockade  FINDINGS: Tendinosis and rupturing of the posterior tibial tendon at the insertion on the navicular tuberosity   IMPLANTS: Arthrex 7.0 mm headless screws A Bio-Tenodesis screw  INDICATIONS:66 y.o. malehad pain and weakness along the area of the posterior tibial tendon insertion.  MRI scan revealed tendinosis and rupturing of the tendon.  He had symptomatic pes planovalgus that was not improving with conservative treatment.   Patient understood the risks, benefits and alternatives to surgery which include but are not limited to wound healing complications, infection, nonunion, malunion, need for further surgery as well as damage to surrounding structures. They also understood the potential for continued pain in that there were no guarantees of acceptable outcome After weighing these risks the patient opted to proceed with surgery.  PROCEDURE: Patient was identified in the preoperative holding area.  The left foot was marked by myself.  Consent was signed by myself and the patient.  Block was performed by anesthesia in the preoperative holding area.  Patient was taken to the operative suite and placed supine on the operative table.  General LMA anesthesia was induced without difficulty. Bump was placed under the operative hip and bone foam was used.  All bony prominences were well  padded.  Tourniquet was placed on the operative thigh.  Preoperative antibiotics were given. The extremity was prepped and draped in the usual sterile fashion and surgical timeout was performed.  The limb was elevated and the tourniquet was inflated to 250 mmHg.  We began by making a longitudinal incision overlying the lateral aspect of the calcaneus in an oblique manner.  It was taken sharply down through skin and subcu tissue.  Blunt dissection was used to mobilize skin flaps.  Then care was taken to identify any branch of the sural nerve which were not identified.  Then this incision was carried deep to the bone.  Periosteal dissection was carried out at the site of the osteotomy.  The sagittal saw was used to create an osteotomy about the calcaneus.  Hohmann retractors were placed dorsally and plantarly about the calcaneus prior to the osteotomy.  Then fluoroscopy confirmed appropriate position of the osteotomy was created.  Osteotome was placed within the osteotomy and the tuberosity was mobilized.  It was then translated medially.  A proximally 7 mm of translation was required.  We then placed 2 K wires from the posterior tuberosity across the osteotomy and fixated it with headless screws.  Then fluoroscopy confirmed appropriate position of the screws and length of the screws.  The wound was then irrigated and closed in a layered fashion using 3-0 Monocryl and 3-0 nylon suture.  We turned our attention to the medial foot.   Posterior tibial tendon debridement: We then turned our attention to the medial foot.  An incision was created overlying the area of the posterior tibial tendon and navicular tuberosity.  This was taken sharply down through skin and subcutaneous tissue.  It was curvilinear to the posterior medial malleolus area.  Blunt dissection was used to mobilize soft  tissue flaps.  Then the sheath overlying the posterior tibial tendon was identified.  The sheath was incised in line with the  posterior tibial tendon.  There is a rush of synovitic fluid.  Then the tendon was dislocated and inspected.  There is found to be evidence of scarring and thickening of the tendon near the level of the medial malleolus.  A portion of the tendon was excisionally debrided using a 15 blade and scissors.  Then the adhered scar tissue was also excisionally debrided from the tendon.  Then the posterior tibial tendon was removed from the navicular tuberosity through sharp dissection.  The tendon then was then further inspected and found to be scarred and thickened.  Further excisional debridement of the distal aspect of the posterior tibial tendon was performed.  Then a Fransisca Kaufmann was used to hold the posterior tibial tendon retracted.  Calcaneonavicular ligament reconstruction: Then turned our attention to the spring ligament.  It was noted there was significant amount of attenuation and tearing of the spring ligament dorsally.  A separate deep incision was created to gain access to the dorsal aspect of the spring ligament complex.  The tear in the spring ligament was completed sharply dorsally and plantarly about the level of the talonavicular joint.  Then the talonavicular joint was inspected and found to be without evidence of chondrosis. The redundant spring ligament tissue was excised. Then using a 2-0 FiberWire stitch the spring ligament was reconstructed in a pants over vest suture fashion with the foot in a maximally inverted and adducted position.  Deep tendon transfer: We then turned our attention to the flexor digitorum longus tendon.  Further deep incision was created through the tendon sheath of the flexor digitorum longus tendon to gain access to the tendon.  The tendon was then tracked distally to the knot of Mallie Mussel.  Manual retraction was performed on the flexor digitorum longus tendon and dissection scissors were used to tenotomized the tendon distally just proximal to the knot of Henry.  Then the  tendon was inspected and found to be without tears.  The appropriate position on the tendon was marked.  A fiber loop suture was used to grab the distal aspect of the tendon.  Then the appropriate position on the navicular tuberosity was identified and the insertion wire was placed in this area and drilled through the navicular.  Then small stab incision was created on the dorsal aspirate of the foot to allow for further advancement of the K wire through the navicular.  Then a tendon sizer was used to choose the appropriate Bio-Tenodesis screw size.  Then the reamer was used over the wire to the appropriate depth.  The tendon was then passed into the Bio-Tenodesis Tunl.  The Bio-Tenodesis screw was placed without difficulty.  The foot again was held in a maximally inverted and the forefoot adducted position.  Once the tendon was placed the posterior tibial tendon was then repaired back to the surrounding soft tissue and flexor digitorum longus tendon. It was reconstructed to aid with regaining strength postoperatively.  The wound was then irrigated copious with normal saline.  The tourniquet was released and hemostasis was obtained.The soft tissue was closed in a layered fashion using 3-0 Monocryl and 3-0 nylon suture.  Soft dressing was placed.  She was placed in a nonweightbearing short leg splint with the hindfoot in an inverted position.  POST OPERATIVE INSTRUCTIONS: Nonweightbearing to operative extremity Keep splint dry Call the office with concerns Take 81 mg  aspirin twice a day for DVT prophylaxis Follow-up in 2 weeks for splint removal, suture removal if appropriate and nonweightbearing x-rays of the operative foot.  He will placed into a short leg cast.   TOURNIQUET TIME:less than 2 hours  BLOOD LOSS:  Minimal         DRAINS: none         SPECIMEN: none       COMPLICATIONS:  * No complications entered in OR log *         Disposition: PACU - hemodynamically stable.          Condition: stable

## 2021-10-09 ENCOUNTER — Other Ambulatory Visit: Payer: Self-pay | Admitting: *Deleted

## 2021-10-09 DIAGNOSIS — N189 Chronic kidney disease, unspecified: Secondary | ICD-10-CM | POA: Diagnosis not present

## 2021-10-09 DIAGNOSIS — R42 Dizziness and giddiness: Secondary | ICD-10-CM | POA: Diagnosis not present

## 2021-10-09 DIAGNOSIS — M79672 Pain in left foot: Secondary | ICD-10-CM | POA: Diagnosis not present

## 2021-10-09 DIAGNOSIS — R5381 Other malaise: Secondary | ICD-10-CM | POA: Diagnosis not present

## 2021-10-09 MED ORDER — ROSUVASTATIN CALCIUM 10 MG PO TABS: 10.0000 mg | ORAL_TABLET | ORAL | 3 refills | Status: DC

## 2021-10-10 DIAGNOSIS — K5900 Constipation, unspecified: Secondary | ICD-10-CM | POA: Diagnosis not present

## 2021-10-10 DIAGNOSIS — R42 Dizziness and giddiness: Secondary | ICD-10-CM | POA: Diagnosis not present

## 2021-10-10 DIAGNOSIS — N189 Chronic kidney disease, unspecified: Secondary | ICD-10-CM | POA: Diagnosis not present

## 2021-10-10 DIAGNOSIS — M79672 Pain in left foot: Secondary | ICD-10-CM | POA: Diagnosis not present

## 2021-10-14 DIAGNOSIS — M79672 Pain in left foot: Secondary | ICD-10-CM | POA: Diagnosis not present

## 2021-10-14 DIAGNOSIS — R5381 Other malaise: Secondary | ICD-10-CM | POA: Diagnosis not present

## 2021-10-14 DIAGNOSIS — R609 Edema, unspecified: Secondary | ICD-10-CM | POA: Diagnosis not present

## 2021-10-14 DIAGNOSIS — N189 Chronic kidney disease, unspecified: Secondary | ICD-10-CM | POA: Diagnosis not present

## 2021-10-15 DIAGNOSIS — M2142 Flat foot [pes planus] (acquired), left foot: Secondary | ICD-10-CM | POA: Diagnosis not present

## 2021-10-16 DIAGNOSIS — I2510 Atherosclerotic heart disease of native coronary artery without angina pectoris: Secondary | ICD-10-CM | POA: Diagnosis not present

## 2021-10-16 DIAGNOSIS — R609 Edema, unspecified: Secondary | ICD-10-CM | POA: Diagnosis not present

## 2021-10-16 DIAGNOSIS — R5381 Other malaise: Secondary | ICD-10-CM | POA: Diagnosis not present

## 2021-10-16 DIAGNOSIS — M79672 Pain in left foot: Secondary | ICD-10-CM | POA: Diagnosis not present

## 2021-10-21 ENCOUNTER — Other Ambulatory Visit (HOSPITAL_BASED_OUTPATIENT_CLINIC_OR_DEPARTMENT_OTHER): Payer: Self-pay

## 2021-10-21 DIAGNOSIS — M79672 Pain in left foot: Secondary | ICD-10-CM | POA: Diagnosis not present

## 2021-10-21 DIAGNOSIS — I2510 Atherosclerotic heart disease of native coronary artery without angina pectoris: Secondary | ICD-10-CM | POA: Diagnosis not present

## 2021-10-21 DIAGNOSIS — R5381 Other malaise: Secondary | ICD-10-CM | POA: Diagnosis not present

## 2021-10-21 DIAGNOSIS — R609 Edema, unspecified: Secondary | ICD-10-CM | POA: Diagnosis not present

## 2021-10-21 MED ORDER — HYDROCHLOROTHIAZIDE 12.5 MG PO TABS
ORAL_TABLET | ORAL | 0 refills | Status: DC
  Filled 2021-10-21: qty 30, 30d supply, fill #0

## 2021-10-22 ENCOUNTER — Other Ambulatory Visit (HOSPITAL_BASED_OUTPATIENT_CLINIC_OR_DEPARTMENT_OTHER): Payer: Self-pay

## 2021-10-22 DIAGNOSIS — S86191D Other injury of other muscle(s) and tendon(s) of posterior muscle group at lower leg level, right leg, subsequent encounter: Secondary | ICD-10-CM | POA: Diagnosis not present

## 2021-10-22 DIAGNOSIS — I129 Hypertensive chronic kidney disease with stage 1 through stage 4 chronic kidney disease, or unspecified chronic kidney disease: Secondary | ICD-10-CM | POA: Diagnosis not present

## 2021-10-22 DIAGNOSIS — D509 Iron deficiency anemia, unspecified: Secondary | ICD-10-CM | POA: Diagnosis not present

## 2021-10-22 DIAGNOSIS — N183 Chronic kidney disease, stage 3 unspecified: Secondary | ICD-10-CM | POA: Diagnosis not present

## 2021-10-22 MED ORDER — OXYCODONE HCL 5 MG PO TABS
ORAL_TABLET | ORAL | 0 refills | Status: DC
  Filled 2021-10-22: qty 30, 5d supply, fill #0

## 2021-10-23 DIAGNOSIS — G4733 Obstructive sleep apnea (adult) (pediatric): Secondary | ICD-10-CM | POA: Diagnosis not present

## 2021-10-23 DIAGNOSIS — N400 Enlarged prostate without lower urinary tract symptoms: Secondary | ICD-10-CM | POA: Diagnosis not present

## 2021-10-23 DIAGNOSIS — I25119 Atherosclerotic heart disease of native coronary artery with unspecified angina pectoris: Secondary | ICD-10-CM | POA: Diagnosis not present

## 2021-10-23 DIAGNOSIS — I129 Hypertensive chronic kidney disease with stage 1 through stage 4 chronic kidney disease, or unspecified chronic kidney disease: Secondary | ICD-10-CM | POA: Diagnosis not present

## 2021-10-23 DIAGNOSIS — E1122 Type 2 diabetes mellitus with diabetic chronic kidney disease: Secondary | ICD-10-CM | POA: Diagnosis not present

## 2021-10-23 DIAGNOSIS — H9190 Unspecified hearing loss, unspecified ear: Secondary | ICD-10-CM | POA: Diagnosis not present

## 2021-10-23 DIAGNOSIS — K219 Gastro-esophageal reflux disease without esophagitis: Secondary | ICD-10-CM | POA: Diagnosis not present

## 2021-10-23 DIAGNOSIS — G620 Drug-induced polyneuropathy: Secondary | ICD-10-CM | POA: Diagnosis not present

## 2021-10-23 DIAGNOSIS — N183 Chronic kidney disease, stage 3 unspecified: Secondary | ICD-10-CM | POA: Diagnosis not present

## 2021-10-23 DIAGNOSIS — I252 Old myocardial infarction: Secondary | ICD-10-CM | POA: Diagnosis not present

## 2021-10-23 DIAGNOSIS — D696 Thrombocytopenia, unspecified: Secondary | ICD-10-CM | POA: Diagnosis not present

## 2021-10-23 DIAGNOSIS — F32A Depression, unspecified: Secondary | ICD-10-CM | POA: Diagnosis not present

## 2021-10-23 DIAGNOSIS — F411 Generalized anxiety disorder: Secondary | ICD-10-CM | POA: Diagnosis not present

## 2021-10-23 DIAGNOSIS — E785 Hyperlipidemia, unspecified: Secondary | ICD-10-CM | POA: Diagnosis not present

## 2021-10-23 DIAGNOSIS — I951 Orthostatic hypotension: Secondary | ICD-10-CM | POA: Diagnosis not present

## 2021-10-23 DIAGNOSIS — Z4789 Encounter for other orthopedic aftercare: Secondary | ICD-10-CM | POA: Diagnosis not present

## 2021-10-24 ENCOUNTER — Telehealth: Payer: Self-pay | Admitting: Internal Medicine

## 2021-10-24 NOTE — Telephone Encounter (Signed)
[facility] called to get verbal orders for Isaac [name], I let him know we were not aware of anything going on with him. He stated patient had ortho surgery and had been in hospital then went to facility in [facility] and released and was now needing [facility]. I let him know that normal when they have ortho surgery that ortho signs those orders. However if we need to sign them patient will need an appointment to discuss what is going on before we can sign off on orders.

## 2021-10-25 ENCOUNTER — Other Ambulatory Visit (HOSPITAL_BASED_OUTPATIENT_CLINIC_OR_DEPARTMENT_OTHER): Payer: Self-pay

## 2021-10-30 ENCOUNTER — Other Ambulatory Visit (HOSPITAL_BASED_OUTPATIENT_CLINIC_OR_DEPARTMENT_OTHER): Payer: Self-pay

## 2021-10-30 MED ORDER — OXYCODONE HCL 5 MG PO TABS
ORAL_TABLET | ORAL | 0 refills | Status: DC
  Filled 2021-10-30: qty 30, 5d supply, fill #0

## 2021-11-06 ENCOUNTER — Other Ambulatory Visit: Payer: Self-pay | Admitting: *Deleted

## 2021-11-06 MED ORDER — RANOLAZINE ER 500 MG PO TB12: 500.0000 mg | ORAL_TABLET | Freq: Two times a day (BID) | ORAL | 11 refills | Status: DC

## 2021-11-07 ENCOUNTER — Other Ambulatory Visit (HOSPITAL_BASED_OUTPATIENT_CLINIC_OR_DEPARTMENT_OTHER): Payer: Self-pay

## 2021-11-07 MED ORDER — OXYCODONE HCL 5 MG PO TABS
ORAL_TABLET | ORAL | 0 refills | Status: DC
  Filled 2021-11-07: qty 24, 6d supply, fill #0

## 2021-11-13 DIAGNOSIS — M2142 Flat foot [pes planus] (acquired), left foot: Secondary | ICD-10-CM | POA: Diagnosis not present

## 2021-11-13 DIAGNOSIS — Z9889 Other specified postprocedural states: Secondary | ICD-10-CM | POA: Diagnosis not present

## 2021-11-15 ENCOUNTER — Other Ambulatory Visit (HOSPITAL_BASED_OUTPATIENT_CLINIC_OR_DEPARTMENT_OTHER): Payer: Self-pay

## 2021-11-15 MED ORDER — OXYCODONE HCL 5 MG PO TABS
ORAL_TABLET | ORAL | 0 refills | Status: DC
  Filled 2021-11-15: qty 24, 7d supply, fill #0

## 2021-11-18 DIAGNOSIS — G4733 Obstructive sleep apnea (adult) (pediatric): Secondary | ICD-10-CM | POA: Diagnosis not present

## 2021-11-19 ENCOUNTER — Encounter: Payer: Self-pay | Admitting: Internal Medicine

## 2021-11-19 ENCOUNTER — Ambulatory Visit (INDEPENDENT_AMBULATORY_CARE_PROVIDER_SITE_OTHER): Payer: Medicare Other | Admitting: Internal Medicine

## 2021-11-19 VITALS — BP 140/90 | HR 80 | Temp 97.6°F

## 2021-11-19 DIAGNOSIS — N184 Chronic kidney disease, stage 4 (severe): Secondary | ICD-10-CM

## 2021-11-19 DIAGNOSIS — M8580 Other specified disorders of bone density and structure, unspecified site: Secondary | ICD-10-CM | POA: Diagnosis not present

## 2021-11-19 DIAGNOSIS — I10 Essential (primary) hypertension: Secondary | ICD-10-CM

## 2021-11-19 DIAGNOSIS — Z23 Encounter for immunization: Secondary | ICD-10-CM

## 2021-11-19 DIAGNOSIS — C679 Malignant neoplasm of bladder, unspecified: Secondary | ICD-10-CM

## 2021-11-19 DIAGNOSIS — M810 Age-related osteoporosis without current pathological fracture: Secondary | ICD-10-CM | POA: Diagnosis not present
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
## 2021-11-20 ENCOUNTER — Telehealth: Payer: Self-pay

## 2021-11-20 ENCOUNTER — Other Ambulatory Visit: Payer: Self-pay

## 2021-11-20 DIAGNOSIS — R7989 Other specified abnormal findings of blood chemistry: Secondary | ICD-10-CM

## 2021-11-20 LAB — CBC WITH DIFFERENTIAL/PLATELET
Absolute Monocytes: 414 cells/uL (ref 200–950)
Basophils Absolute: 18 cells/uL (ref 0–200)
Basophils Relative: 0.4 %
Eosinophils Absolute: 60 cells/uL (ref 15–500)
Eosinophils Relative: 1.3 %
HCT: 41.6 % (ref 38.5–50.0)
Hemoglobin: 13.9 g/dL (ref 13.2–17.1)
Lymphs Abs: 1444 cells/uL (ref 850–3900)
MCH: 32.6 pg (ref 27.0–33.0)
MCHC: 33.4 g/dL (ref 32.0–36.0)
MCV: 97.7 fL (ref 80.0–100.0)
MPV: 9.4 fL (ref 7.5–12.5)
Monocytes Relative: 9 %
Neutro Abs: 2663 cells/uL (ref 1500–7800)
Neutrophils Relative %: 57.9 %
Platelets: 137 10*3/uL — ABNORMAL LOW (ref 140–400)
RBC: 4.26 10*6/uL (ref 4.20–5.80)
RDW: 12 % (ref 11.0–15.0)
Total Lymphocyte: 31.4 %
WBC: 4.6 10*3/uL (ref 3.8–10.8)

## 2021-11-20 LAB — COMPLETE METABOLIC PANEL WITH GFR
AG Ratio: 2 (calc) (ref 1.0–2.5)
ALT: 29 U/L (ref 9–46)
AST: 24 U/L (ref 10–35)
Albumin: 4.3 g/dL (ref 3.6–5.1)
Alkaline phosphatase (APISO): 52 U/L (ref 35–144)
BUN/Creatinine Ratio: 7 (calc) (ref 6–22)
BUN: 20 mg/dL (ref 7–25)
CO2: 25 mmol/L (ref 20–32)
Calcium: 9.5 mg/dL (ref 8.6–10.3)
Chloride: 106 mmol/L (ref 98–110)
Creat: 2.71 mg/dL — ABNORMAL HIGH (ref 0.70–1.35)
Globulin: 2.2 g/dL (calc) (ref 1.9–3.7)
Glucose, Bld: 176 mg/dL — ABNORMAL HIGH (ref 65–99)
Potassium: 6 mmol/L — ABNORMAL HIGH (ref 3.5–5.3)
Sodium: 142 mmol/L (ref 135–146)
Total Bilirubin: 0.6 mg/dL (ref 0.2–1.2)
Total Protein: 6.5 g/dL (ref 6.1–8.1)
eGFR: 25 mL/min/{1.73_m2} — ABNORMAL LOW (ref >=60)

## 2021-11-20 LAB — VITAMIN D 25 HYDROXY (VIT D DEFICIENCY, FRACTURES): Vit D, 25-Hydroxy: 57 ng/mL (ref 30–100)

## 2021-11-20 NOTE — Patient Instructions (Addendum)
His potassium needs to be repeated stat when he is well-hydrated.  He needs referral to [facility].

## 2021-11-20 NOTE — Telephone Encounter (Signed)
Detailed message left on identifiable machine belonging to the patient. He has been notified of referral to [facility] as well as to have Kidney Ultrasound done. He has been advised to call with questions.

## 2021-11-21 ENCOUNTER — Other Ambulatory Visit: Payer: Medicare Other

## 2021-11-21 DIAGNOSIS — S86191D Other injury of other muscle(s) and tendon(s) of posterior muscle group at lower leg level, right leg, subsequent encounter: Secondary | ICD-10-CM | POA: Diagnosis not present

## 2021-11-21 DIAGNOSIS — E875 Hyperkalemia: Secondary | ICD-10-CM

## 2021-11-21 LAB — BASIC METABOLIC PANEL
BUN/Creatinine Ratio: 9 (calc) (ref 6–22)
BUN: 18 mg/dL (ref 7–25)
CO2: 27 mmol/L (ref 20–32)
Calcium: 9.4 mg/dL (ref 8.6–10.3)
Chloride: 102 mmol/L (ref 98–110)
Creat: 1.95 mg/dL — ABNORMAL HIGH (ref 0.70–1.35)
Glucose, Bld: 151 mg/dL — ABNORMAL HIGH (ref 65–99)
Potassium: 4.5 mmol/L (ref 3.5–5.3)
Sodium: 137 mmol/L (ref 135–146)

## 2021-11-27 DIAGNOSIS — F411 Generalized anxiety disorder: Secondary | ICD-10-CM | POA: Diagnosis not present

## 2021-11-27 DIAGNOSIS — F32A Depression, unspecified: Secondary | ICD-10-CM | POA: Diagnosis not present

## 2021-11-27 DIAGNOSIS — K219 Gastro-esophageal reflux disease without esophagitis: Secondary | ICD-10-CM | POA: Diagnosis not present

## 2021-11-27 DIAGNOSIS — G4733 Obstructive sleep apnea (adult) (pediatric): Secondary | ICD-10-CM | POA: Diagnosis not present

## 2021-11-27 DIAGNOSIS — I129 Hypertensive chronic kidney disease with stage 1 through stage 4 chronic kidney disease, or unspecified chronic kidney disease: Secondary | ICD-10-CM | POA: Diagnosis not present

## 2021-11-27 DIAGNOSIS — D696 Thrombocytopenia, unspecified: Secondary | ICD-10-CM | POA: Diagnosis not present

## 2021-11-27 DIAGNOSIS — G620 Drug-induced polyneuropathy: Secondary | ICD-10-CM | POA: Diagnosis not present

## 2021-11-27 DIAGNOSIS — I252 Old myocardial infarction: Secondary | ICD-10-CM | POA: Diagnosis not present

## 2021-11-27 DIAGNOSIS — E1122 Type 2 diabetes mellitus with diabetic chronic kidney disease: Secondary | ICD-10-CM | POA: Diagnosis not present

## 2021-11-27 DIAGNOSIS — N183 Chronic kidney disease, stage 3 unspecified: Secondary | ICD-10-CM | POA: Diagnosis not present

## 2021-11-27 DIAGNOSIS — N400 Enlarged prostate without lower urinary tract symptoms: Secondary | ICD-10-CM | POA: Diagnosis not present

## 2021-11-27 DIAGNOSIS — E785 Hyperlipidemia, unspecified: Secondary | ICD-10-CM | POA: Diagnosis not present

## 2021-11-27 DIAGNOSIS — I25119 Atherosclerotic heart disease of native coronary artery with unspecified angina pectoris: Secondary | ICD-10-CM | POA: Diagnosis not present

## 2021-11-27 DIAGNOSIS — Z4789 Encounter for other orthopedic aftercare: Secondary | ICD-10-CM | POA: Diagnosis not present

## 2021-11-27 DIAGNOSIS — H9190 Unspecified hearing loss, unspecified ear: Secondary | ICD-10-CM | POA: Diagnosis not present

## 2021-11-27 DIAGNOSIS — I951 Orthostatic hypotension: Secondary | ICD-10-CM | POA: Diagnosis not present

## 2021-12-03 ENCOUNTER — Other Ambulatory Visit (HOSPITAL_BASED_OUTPATIENT_CLINIC_OR_DEPARTMENT_OTHER): Payer: Self-pay

## 2021-12-03 ENCOUNTER — Ambulatory Visit (HOSPITAL_COMMUNITY)
Admission: RE | Admit: 2021-12-03 | Discharge: 2021-12-03 | Disposition: A | Discharge status: Home / Self Care | Payer: Medicare Other | Source: Ambulatory Visit | Attending: Internal Medicine | Admitting: Internal Medicine

## 2021-12-03 DIAGNOSIS — R7989 Other specified abnormal findings of blood chemistry: Secondary | ICD-10-CM | POA: Diagnosis not present

## 2021-12-03 MED ORDER — OXYCODONE HCL 5 MG PO TABS
ORAL_TABLET | ORAL | 0 refills | Status: DC
  Filled 2021-12-03: qty 24, 6d supply, fill #0

## 2021-12-10 DIAGNOSIS — E1142 Type 2 diabetes mellitus with diabetic polyneuropathy: Secondary | ICD-10-CM | POA: Diagnosis not present

## 2021-12-10 DIAGNOSIS — B351 Tinea unguium: Secondary | ICD-10-CM | POA: Diagnosis not present

## 2021-12-11 DIAGNOSIS — R531 Weakness: Secondary | ICD-10-CM | POA: Diagnosis not present

## 2021-12-11 DIAGNOSIS — M25572 Pain in left ankle and joints of left foot: Secondary | ICD-10-CM | POA: Diagnosis not present

## 2021-12-11 DIAGNOSIS — R2689 Other abnormalities of gait and mobility: Secondary | ICD-10-CM | POA: Diagnosis not present

## 2021-12-11 DIAGNOSIS — M25672 Stiffness of left ankle, not elsewhere classified: Secondary | ICD-10-CM | POA: Diagnosis not present

## 2021-12-12 DIAGNOSIS — E119 Type 2 diabetes mellitus without complications: Secondary | ICD-10-CM | POA: Diagnosis not present

## 2021-12-12 DIAGNOSIS — Z794 Long term (current) use of insulin: Secondary | ICD-10-CM | POA: Diagnosis not present

## 2021-12-12 DIAGNOSIS — N1832 Chronic kidney disease, stage 3b: Secondary | ICD-10-CM | POA: Diagnosis not present

## 2021-12-17 DIAGNOSIS — R531 Weakness: Secondary | ICD-10-CM | POA: Diagnosis not present

## 2021-12-17 DIAGNOSIS — R2689 Other abnormalities of gait and mobility: Secondary | ICD-10-CM | POA: Diagnosis not present

## 2021-12-17 DIAGNOSIS — M25572 Pain in left ankle and joints of left foot: Secondary | ICD-10-CM | POA: Diagnosis not present

## 2021-12-17 DIAGNOSIS — M25672 Stiffness of left ankle, not elsewhere classified: Secondary | ICD-10-CM | POA: Diagnosis not present

## 2021-12-18 ENCOUNTER — Ambulatory Visit (HOSPITAL_COMMUNITY)
Admission: RE | Admit: 2021-12-18 | Discharge: 2021-12-18 | Disposition: A | Discharge status: Home / Self Care | Payer: Medicare Other | Source: Ambulatory Visit | Attending: Hematology & Oncology | Admitting: Hematology & Oncology

## 2021-12-18 DIAGNOSIS — K5730 Diverticulosis of large intestine without perforation or abscess without bleeding: Secondary | ICD-10-CM | POA: Diagnosis not present

## 2021-12-18 DIAGNOSIS — C651 Malignant neoplasm of right renal pelvis: Secondary | ICD-10-CM | POA: Diagnosis not present

## 2021-12-18 DIAGNOSIS — R1012 Left upper quadrant pain: Secondary | ICD-10-CM | POA: Diagnosis not present

## 2021-12-18 LAB — POCT I-STAT CREATININE: Creatinine, Ser: 1.9 mg/dL — ABNORMAL HIGH (ref 0.61–1.24)

## 2021-12-18 MED ORDER — IOHEXOL 300 MG/ML  SOLN
80.0000 mL | Freq: Once | INTRAMUSCULAR | Status: AC | PRN
  Administered 2021-12-18: 80 mL via INTRAVENOUS

## 2021-12-19 DIAGNOSIS — M25672 Stiffness of left ankle, not elsewhere classified: Secondary | ICD-10-CM | POA: Diagnosis not present

## 2021-12-19 DIAGNOSIS — R531 Weakness: Secondary | ICD-10-CM | POA: Diagnosis not present

## 2021-12-19 DIAGNOSIS — M25572 Pain in left ankle and joints of left foot: Secondary | ICD-10-CM | POA: Diagnosis not present

## 2021-12-19 DIAGNOSIS — R2689 Other abnormalities of gait and mobility: Secondary | ICD-10-CM | POA: Diagnosis not present

## 2021-12-20 ENCOUNTER — Telehealth: Payer: Self-pay | Admitting: *Deleted

## 2021-12-20 DIAGNOSIS — I129 Hypertensive chronic kidney disease with stage 1 through stage 4 chronic kidney disease, or unspecified chronic kidney disease: Secondary | ICD-10-CM | POA: Diagnosis not present

## 2021-12-20 DIAGNOSIS — N184 Chronic kidney disease, stage 4 (severe): Secondary | ICD-10-CM | POA: Diagnosis not present

## 2021-12-20 DIAGNOSIS — R809 Proteinuria, unspecified: Secondary | ICD-10-CM | POA: Diagnosis not present

## 2021-12-20 DIAGNOSIS — D631 Anemia in chronic kidney disease: Secondary | ICD-10-CM | POA: Diagnosis not present

## 2021-12-20 NOTE — Telephone Encounter (Signed)
As noted below by Dr. [name], I left a message for patient informing him that there is NO recurrent bladder cancer. You do have atherosclerosis in the aorta and other arteries from your diabetes. Please call the office if you have any questions or concerns.

## 2021-12-20 NOTE — Telephone Encounter (Signed)
-----   Message from [name], MD sent at 12/19/2021  5:46 PM EDT ----- Please call and let him know that there is no recurrent bladder cancer.  He does have atherosclerosis in the aorta and other arteries from his diabetes.  Thanks.  Laurey Arrow

## 2021-12-22 DIAGNOSIS — S86191D Other injury of other muscle(s) and tendon(s) of posterior muscle group at lower leg level, right leg, subsequent encounter: Secondary | ICD-10-CM | POA: Diagnosis not present

## 2021-12-24 DIAGNOSIS — M25672 Stiffness of left ankle, not elsewhere classified: Secondary | ICD-10-CM | POA: Diagnosis not present

## 2021-12-24 DIAGNOSIS — R2689 Other abnormalities of gait and mobility: Secondary | ICD-10-CM | POA: Diagnosis not present

## 2021-12-24 DIAGNOSIS — M25572 Pain in left ankle and joints of left foot: Secondary | ICD-10-CM | POA: Diagnosis not present

## 2021-12-24 DIAGNOSIS — R531 Weakness: Secondary | ICD-10-CM | POA: Diagnosis not present

## 2021-12-25 ENCOUNTER — Encounter: Payer: Self-pay | Admitting: Family

## 2021-12-25 ENCOUNTER — Other Ambulatory Visit (HOSPITAL_BASED_OUTPATIENT_CLINIC_OR_DEPARTMENT_OTHER): Payer: Self-pay

## 2021-12-25 DIAGNOSIS — M2142 Flat foot [pes planus] (acquired), left foot: Secondary | ICD-10-CM | POA: Diagnosis not present

## 2021-12-25 MED ORDER — OXYCODONE HCL 5 MG PO TABS
5.0000 mg | ORAL_TABLET | Freq: Three times a day (TID) | ORAL | 0 refills | Status: AC | PRN
  Filled 2021-12-25: qty 24, 7d supply, fill #0

## 2021-12-26 DIAGNOSIS — M25672 Stiffness of left ankle, not elsewhere classified: Secondary | ICD-10-CM | POA: Diagnosis not present

## 2021-12-26 DIAGNOSIS — R2689 Other abnormalities of gait and mobility: Secondary | ICD-10-CM | POA: Diagnosis not present

## 2021-12-26 DIAGNOSIS — M25572 Pain in left ankle and joints of left foot: Secondary | ICD-10-CM | POA: Diagnosis not present

## 2021-12-26 DIAGNOSIS — R531 Weakness: Secondary | ICD-10-CM | POA: Diagnosis not present

## 2021-12-31 DIAGNOSIS — M25672 Stiffness of left ankle, not elsewhere classified: Secondary | ICD-10-CM | POA: Diagnosis not present

## 2021-12-31 DIAGNOSIS — R2689 Other abnormalities of gait and mobility: Secondary | ICD-10-CM | POA: Diagnosis not present

## 2021-12-31 DIAGNOSIS — M25572 Pain in left ankle and joints of left foot: Secondary | ICD-10-CM | POA: Diagnosis not present

## 2021-12-31 DIAGNOSIS — R531 Weakness: Secondary | ICD-10-CM | POA: Diagnosis not present

## 2022-01-02 DIAGNOSIS — R2689 Other abnormalities of gait and mobility: Secondary | ICD-10-CM | POA: Diagnosis not present

## 2022-01-02 DIAGNOSIS — M25572 Pain in left ankle and joints of left foot: Secondary | ICD-10-CM | POA: Diagnosis not present

## 2022-01-02 DIAGNOSIS — R531 Weakness: Secondary | ICD-10-CM | POA: Diagnosis not present

## 2022-01-02 DIAGNOSIS — M25672 Stiffness of left ankle, not elsewhere classified: Secondary | ICD-10-CM | POA: Diagnosis not present

## 2022-01-07 DIAGNOSIS — M25672 Stiffness of left ankle, not elsewhere classified: Secondary | ICD-10-CM | POA: Diagnosis not present

## 2022-01-07 DIAGNOSIS — M25572 Pain in left ankle and joints of left foot: Secondary | ICD-10-CM | POA: Diagnosis not present

## 2022-01-07 DIAGNOSIS — R2689 Other abnormalities of gait and mobility: Secondary | ICD-10-CM | POA: Diagnosis not present

## 2022-01-07 DIAGNOSIS — R531 Weakness: Secondary | ICD-10-CM | POA: Diagnosis not present

## 2022-01-09 DIAGNOSIS — M25672 Stiffness of left ankle, not elsewhere classified: Secondary | ICD-10-CM | POA: Diagnosis not present

## 2022-01-09 DIAGNOSIS — R2689 Other abnormalities of gait and mobility: Secondary | ICD-10-CM | POA: Diagnosis not present

## 2022-01-09 DIAGNOSIS — R531 Weakness: Secondary | ICD-10-CM | POA: Diagnosis not present

## 2022-01-09 DIAGNOSIS — M25572 Pain in left ankle and joints of left foot: Secondary | ICD-10-CM | POA: Diagnosis not present

## 2022-01-14 ENCOUNTER — Other Ambulatory Visit (HOSPITAL_BASED_OUTPATIENT_CLINIC_OR_DEPARTMENT_OTHER): Payer: Self-pay

## 2022-01-14 ENCOUNTER — Encounter: Payer: Self-pay | Admitting: Family

## 2022-01-14 DIAGNOSIS — R531 Weakness: Secondary | ICD-10-CM | POA: Diagnosis not present

## 2022-01-14 DIAGNOSIS — M25572 Pain in left ankle and joints of left foot: Secondary | ICD-10-CM | POA: Diagnosis not present

## 2022-01-14 DIAGNOSIS — R2689 Other abnormalities of gait and mobility: Secondary | ICD-10-CM | POA: Diagnosis not present

## 2022-01-14 DIAGNOSIS — M25672 Stiffness of left ankle, not elsewhere classified: Secondary | ICD-10-CM | POA: Diagnosis not present

## 2022-01-15 ENCOUNTER — Other Ambulatory Visit (HOSPITAL_BASED_OUTPATIENT_CLINIC_OR_DEPARTMENT_OTHER): Payer: Self-pay

## 2022-01-15 ENCOUNTER — Inpatient Hospital Stay: Payer: Medicare Other | Attending: Hematology & Oncology

## 2022-01-15 ENCOUNTER — Encounter: Payer: Self-pay | Admitting: Hematology & Oncology

## 2022-01-15 ENCOUNTER — Inpatient Hospital Stay: Payer: Medicare Other

## 2022-01-15 ENCOUNTER — Other Ambulatory Visit: Payer: Self-pay

## 2022-01-15 ENCOUNTER — Inpatient Hospital Stay: Payer: Medicare Other | Admitting: Hematology & Oncology

## 2022-01-15 ENCOUNTER — Telehealth: Payer: Self-pay | Admitting: *Deleted

## 2022-01-15 VITALS — BP 127/74 | HR 69 | Temp 97.6°F | Resp 18 | Wt 213.0 lb

## 2022-01-15 DIAGNOSIS — Z905 Acquired absence of kidney: Secondary | ICD-10-CM | POA: Diagnosis not present

## 2022-01-15 DIAGNOSIS — C651 Malignant neoplasm of right renal pelvis: Secondary | ICD-10-CM

## 2022-01-15 DIAGNOSIS — Z9221 Personal history of antineoplastic chemotherapy: Secondary | ICD-10-CM | POA: Diagnosis not present

## 2022-01-15 DIAGNOSIS — Z8551 Personal history of malignant neoplasm of bladder: Secondary | ICD-10-CM | POA: Diagnosis not present

## 2022-01-15 LAB — CBC WITH DIFFERENTIAL (CANCER CENTER ONLY)
Abs Immature Granulocytes: 0.02 10*3/uL (ref 0.00–0.07)
Basophils Absolute: 0 10*3/uL (ref 0.0–0.1)
Basophils Relative: 1 %
Eosinophils Absolute: 0.1 10*3/uL (ref 0.0–0.5)
Eosinophils Relative: 1 %
HCT: 42.5 % (ref 39.0–52.0)
Hemoglobin: 14.5 g/dL (ref 13.0–17.0)
Immature Granulocytes: 1 %
Lymphocytes Relative: 31 %
Lymphs Abs: 1.2 10*3/uL (ref 0.7–4.0)
MCH: 32.7 pg (ref 26.0–34.0)
MCHC: 34.1 g/dL (ref 30.0–36.0)
MCV: 95.7 fL (ref 80.0–100.0)
Monocytes Absolute: 0.4 10*3/uL (ref 0.1–1.0)
Monocytes Relative: 10 %
Neutro Abs: 2.1 10*3/uL (ref 1.7–7.7)
Neutrophils Relative %: 56 %
Platelet Count: 125 10*3/uL — ABNORMAL LOW (ref 150–400)
RBC: 4.44 MIL/uL (ref 4.22–5.81)
RDW: 13.2 % (ref 11.5–15.5)
WBC Count: 3.8 10*3/uL — ABNORMAL LOW (ref 4.0–10.5)
nRBC: 0 % (ref 0.0–0.2)

## 2022-01-15 LAB — CMP (CANCER CENTER ONLY)
ALT: 22 U/L (ref 0–44)
AST: 19 U/L (ref 15–41)
Albumin: 4.3 g/dL (ref 3.5–5.0)
Alkaline Phosphatase: 51 U/L (ref 38–126)
Anion gap: 9 (ref 5–15)
BUN: 27 mg/dL — ABNORMAL HIGH (ref 8–23)
CO2: 23 mmol/L (ref 22–32)
Calcium: 9 mg/dL (ref 8.9–10.3)
Chloride: 100 mmol/L (ref 98–111)
Creatinine: 1.89 mg/dL — ABNORMAL HIGH (ref 0.61–1.24)
GFR, Estimated: 39 mL/min — ABNORMAL LOW (ref >60)
Glucose, Bld: 305 mg/dL — ABNORMAL HIGH (ref 70–99)
Potassium: 4.5 mmol/L (ref 3.5–5.1)
Sodium: 132 mmol/L — ABNORMAL LOW (ref 135–145)
Total Bilirubin: 0.7 mg/dL (ref 0.3–1.2)
Total Protein: 6.7 g/dL (ref 6.5–8.1)

## 2022-01-15 LAB — LACTATE DEHYDROGENASE: LDH: 133 U/L (ref 98–192)

## 2022-01-15 NOTE — Patient Instructions (Signed)

## 2022-01-15 NOTE — Progress Notes (Signed)
Hematology and Oncology Follow Up Visit  Isaac [name] [id number] 15-Jun-1955 66 y.o. 01/15/2022   Principle Diagnosis:  Stage III (T3NxM0) Infiltrating high grade papillary urothelial carcinoma of the RIGHT renal hilum - right nephroureterectomy on 02/12/2017   Past Therapy:        Cisplatin/Gemcitabine s/p cycle 6 - completed on 07/27/2017   Current Therapy: Observation   Interim History:  [name] is here today for follow-up.  We last saw him back in May.  The day after we saw him, he had surgery for his left ankle.  He is still recovering from this.  He now has a brace on.  Is still doing some physical therapy.  His blood sugar is on the high side today.  He said this is because he had coffee this morning.  He did have a CT scan of the abdomen pelvis back on 12/18/2021.  This did not show any evidence of recurrent urothelial cancer.  He has had no problems with cough or shortness of breath.  He has had no change in bowel or bladder habits.  He has had no bleeding.  There is been no fever.  Overall, I would say his performance status is probably ECOG 1.   Medications:  Allergies as of 01/15/2022       Reactions   Nsaids Other (See Comments)   Has 1 kidney left        Medication List        Accurate as of January 15, 2022  2:08 PM. If you have any questions, ask your nurse or doctor.          acetaminophen 500 MG tablet Commonly known as: TYLENOL Take 500 mg by mouth every 6 (six) hours as needed for moderate pain or headache.   ALPRAZolam 1 MG tablet Commonly known as: XANAX Take 1 tablet (1 mg total) by mouth 3 (three) times daily as needed for anxiety.   glimepiride 2 MG tablet Commonly known as: AMARYL TAKE 1 BY MOUTH DAILY BEFORE SUPPER What changed:  how much to take how to take this when to take this additional instructions   insulin glargine (1 Unit Dial) 300 UNIT/ML Solostar Pen Commonly known as: TOUJEO Inject 40 Units into the skin 2  (two) times daily.   metoprolol succinate 25 MG 24 hr tablet Commonly known as: TOPROL-XL TAKE 1 TABLET BY MOUTH AT BEDTIME. GENERIC EQUIVALENT FOR TOPROL-XL   multivitamin with minerals tablet Take 1 tablet by mouth daily.   nitroGLYCERIN 0.4 MG SL tablet Commonly known as: NITROSTAT Place 1 tablet (0.4 mg total) under the tongue every 5 (five) minutes as needed for chest pain. Repeat every 5 minute up to 3 doses if no relief call 911.   ondansetron 4 MG tablet Commonly known as: ZOFRAN Take 1 tablet (4 mg total) by mouth every 8 (eight) hours as needed.   oxyCODONE 5 MG immediate release tablet Commonly known as: Oxy IR/ROXICODONE Take 1 tablet (5 mg total) by mouth every 4 (four) hours as needed for moderate pain or severe pain.   oxyCODONE 5 MG immediate release tablet Commonly known as: Oxy IR/ROXICODONE Take 1 tablet by mouth every six to eight hours as needed for pain   oxyCODONE 5 MG immediate release tablet Commonly known as: Oxy IR/ROXICODONE Take 1 tablet (5 mg total) by mouth every 6 (six) to 8 (eight) hours as needed for pain.   Ozempic (0.25 or 0.5 MG/DOSE) 2 MG/1.5ML Sopn Generic drug: Semaglutide(0.25 or  0.'5MG'$ /DOS) Inject 0.5 mg into the skin once a week.   pantoprazole 40 MG tablet Commonly known as: PROTONIX Take 1 tablet (40 mg total) by mouth every morning.   PRESCRIPTION MEDICATION Inhale into the lungs at bedtime. CPAP   PROBIOTIC PO Take 1 tablet by mouth daily.   ranolazine 500 MG 12 hr tablet Commonly known as: Ranexa Take 1 tablet (500 mg total) by mouth 2 (two) times daily.   rOPINIRole 2 MG tablet Commonly known as: REQUIP TAKE 1 TABLET BY MOUTH AT BEDTIME   rosuvastatin 10 MG tablet Commonly known as: CRESTOR Take 1 tablet (10 mg total) by mouth every other day.   tadalafil 20 MG tablet Commonly known as: CIALIS Take 1 tablet by mouth daily as needed   tamsulosin 0.4 MG Caps capsule Commonly known as: FLOMAX Take 2 capsules by  mouth everyday        Allergies:  Allergies  Allergen Reactions   Nsaids Other (See Comments)    Has 1 kidney left    Past Medical History, Surgical history, Social history, and Family History were reviewed and updated.  Review of Systems: Review of Systems  Constitutional: Negative.   HENT: Negative.    Eyes: Negative.   Respiratory: Negative.    Cardiovascular: Negative.   Gastrointestinal: Negative.   Genitourinary: Negative.   Musculoskeletal:  Positive for back pain.  Skin: Negative.   Neurological:  Positive for headaches.  Endo/Heme/Allergies: Negative.   Psychiatric/Behavioral: Negative.       Physical Exam:  weight is 213 lb (96.6 kg). His oral temperature is 97.6 F (36.4 C). His blood pressure is 127/74 and his pulse is 69. His respiration is 18 and oxygen saturation is 99%.   Wt Readings from Last 3 Encounters:  01/15/22 213 lb (96.6 kg)  10/01/21 219 lb (99.3 kg)  09/30/21 219 lb (99.3 kg)    Physical Exam Vitals reviewed.  HENT:     Head: Normocephalic and atraumatic.  Eyes:     Pupils: Pupils are equal, round, and reactive to light.  Cardiovascular:     Rate and Rhythm: Normal rate and regular rhythm.     Heart sounds: Normal heart sounds.  Pulmonary:     Effort: Pulmonary effort is normal.     Breath sounds: Normal breath sounds.  Abdominal:     General: Bowel sounds are normal.     Palpations: Abdomen is soft.  Musculoskeletal:        General: No tenderness or deformity. Normal range of motion.     Cervical back: Normal range of motion.     Comments: He has a brace on his left ankle.  There may be a little bit of swelling in the left lower leg.  Lymphadenopathy:     Cervical: No cervical adenopathy.  Skin:    General: Skin is warm and dry.     Findings: No erythema or rash.  Neurological:     Mental Status: He is alert and oriented to person, place, and time.  Psychiatric:        Behavior: Behavior normal.        Thought Content:  Thought content normal.        Judgment: Judgment normal.     Lab Results  Component Value Date   WBC 3.8 (L) 01/15/2022   HGB 14.5 01/15/2022   HCT 42.5 01/15/2022   MCV 95.7 01/15/2022   PLT 125 (L) 01/15/2022   Lab Results  Component Value Date  FERRITIN 230 06/04/2021   IRON 129 06/04/2021   TIBC 329 06/04/2021   UIBC 200 06/04/2021   IRONPCTSAT 39 06/04/2021   Lab Results  Component Value Date   RETICCTPCT 2.0 06/04/2021   RBC 4.44 01/15/2022   No results found for: "KPAFRELGTCHN", "LAMBDASER", "KAPLAMBRATIO" Lab Results  Component Value Date   IGA 232 01/25/2015   No results found for: "TOTALPROTELP", "ALBUMINELP", "A1GS", "A2GS", "BETS", "BETA2SER", "GAMS", "MSPIKE", "SPEI"   Chemistry      Component Value Date/Time   NA 132 (L) 01/15/2022 1320   NA 139 09/03/2021 1456   NA 134 05/04/2017 0925   K 4.5 01/15/2022 1320   K 4.5 05/04/2017 0925   CL 100 01/15/2022 1320   CL 97 (L) 05/04/2017 0925   CO2 23 01/15/2022 1320   CO2 23 05/04/2017 0925   BUN 27 (H) 01/15/2022 1320   BUN 21 09/03/2021 1456   BUN 28 (H) 05/04/2017 0925   CREATININE 1.89 (H) 01/15/2022 1320   CREATININE 1.95 (H) 11/21/2021 1041   GLU 86 03/31/2016 0000      Component Value Date/Time   CALCIUM 9.0 01/15/2022 1320   CALCIUM 9.3 05/04/2017 0925   ALKPHOS 51 01/15/2022 1320   ALKPHOS 58 05/04/2017 0925   AST 19 01/15/2022 1320   ALT 22 01/15/2022 1320   ALT 90 (H) 05/04/2017 0925   BILITOT 0.7 01/15/2022 1320       Impression and Plan: [name] is a very pleasant 66 yo caucasian gentleman with infiltrating high grade papillary urothelial carcinoma with right nephrectomy in October 2018 and adjuvant chemotherapy.   He really has taken some time to get the ankle healed.  Looks like he is doing better.  At this point, I think that we do not need to do any scans on him for at least 6 months.  He is now 5 years out from his adjuvant chemotherapy for the urothelial  cancer.  Hopefully, when we see him back, he will not have the brace on his left ankle.    [name], MD 9/6/20232:08 PM

## 2022-01-15 NOTE — Telephone Encounter (Signed)
Per 01/15/22 los - gave upcoming appointments - confirmed

## 2022-01-16 DIAGNOSIS — M25572 Pain in left ankle and joints of left foot: Secondary | ICD-10-CM | POA: Diagnosis not present

## 2022-01-16 DIAGNOSIS — R2689 Other abnormalities of gait and mobility: Secondary | ICD-10-CM | POA: Diagnosis not present

## 2022-01-16 DIAGNOSIS — M25672 Stiffness of left ankle, not elsewhere classified: Secondary | ICD-10-CM | POA: Diagnosis not present

## 2022-01-16 DIAGNOSIS — R531 Weakness: Secondary | ICD-10-CM | POA: Diagnosis not present

## 2022-01-21 DIAGNOSIS — R2689 Other abnormalities of gait and mobility: Secondary | ICD-10-CM | POA: Diagnosis not present

## 2022-01-21 DIAGNOSIS — M25672 Stiffness of left ankle, not elsewhere classified: Secondary | ICD-10-CM | POA: Diagnosis not present

## 2022-01-21 DIAGNOSIS — M25572 Pain in left ankle and joints of left foot: Secondary | ICD-10-CM | POA: Diagnosis not present

## 2022-01-21 DIAGNOSIS — R531 Weakness: Secondary | ICD-10-CM | POA: Diagnosis not present

## 2022-01-23 DIAGNOSIS — M25572 Pain in left ankle and joints of left foot: Secondary | ICD-10-CM | POA: Diagnosis not present

## 2022-01-23 DIAGNOSIS — R531 Weakness: Secondary | ICD-10-CM | POA: Diagnosis not present

## 2022-01-23 DIAGNOSIS — M25672 Stiffness of left ankle, not elsewhere classified: Secondary | ICD-10-CM | POA: Diagnosis not present

## 2022-01-23 DIAGNOSIS — R2689 Other abnormalities of gait and mobility: Secondary | ICD-10-CM | POA: Diagnosis not present

## 2022-01-28 ENCOUNTER — Other Ambulatory Visit (HOSPITAL_BASED_OUTPATIENT_CLINIC_OR_DEPARTMENT_OTHER): Payer: Self-pay

## 2022-01-28 ENCOUNTER — Encounter: Payer: Self-pay | Admitting: Family

## 2022-01-28 DIAGNOSIS — N401 Enlarged prostate with lower urinary tract symptoms: Secondary | ICD-10-CM | POA: Diagnosis not present

## 2022-01-28 DIAGNOSIS — C678 Malignant neoplasm of overlapping sites of bladder: Secondary | ICD-10-CM | POA: Diagnosis not present

## 2022-01-28 DIAGNOSIS — N5201 Erectile dysfunction due to arterial insufficiency: Secondary | ICD-10-CM | POA: Diagnosis not present

## 2022-01-28 DIAGNOSIS — R350 Frequency of micturition: Secondary | ICD-10-CM | POA: Diagnosis not present

## 2022-01-28 MED ORDER — TADALAFIL 5 MG PO TABS
5.0000 mg | ORAL_TABLET | Freq: Every day | ORAL | 5 refills | Status: DC
  Filled 2022-01-28: qty 30, 30d supply, fill #0
  Filled 2022-03-08: qty 30, 30d supply, fill #1
  Filled 2022-04-30: qty 30, 30d supply, fill #2
  Filled 2022-06-10: qty 30, 30d supply, fill #3
  Filled 2022-07-03 – 2022-07-07 (×2): qty 30, 30d supply, fill #4

## 2022-01-30 DIAGNOSIS — M25572 Pain in left ankle and joints of left foot: Secondary | ICD-10-CM | POA: Diagnosis not present

## 2022-01-30 DIAGNOSIS — M25672 Stiffness of left ankle, not elsewhere classified: Secondary | ICD-10-CM | POA: Diagnosis not present

## 2022-01-30 DIAGNOSIS — R2689 Other abnormalities of gait and mobility: Secondary | ICD-10-CM | POA: Diagnosis not present

## 2022-01-30 DIAGNOSIS — R531 Weakness: Secondary | ICD-10-CM | POA: Diagnosis not present

## 2022-01-31 ENCOUNTER — Other Ambulatory Visit (HOSPITAL_BASED_OUTPATIENT_CLINIC_OR_DEPARTMENT_OTHER): Payer: Self-pay

## 2022-02-05 DIAGNOSIS — M2142 Flat foot [pes planus] (acquired), left foot: Secondary | ICD-10-CM | POA: Diagnosis not present

## 2022-02-19 ENCOUNTER — Other Ambulatory Visit: Payer: Self-pay

## 2022-02-19 MED ORDER — PANTOPRAZOLE SODIUM 40 MG PO TBEC: 40.0000 mg | DELAYED_RELEASE_TABLET | Freq: Every morning | ORAL | 6 refills | Status: DC

## 2022-02-19 MED ORDER — METOPROLOL SUCCINATE ER 25 MG PO TB24: ORAL_TABLET | ORAL | 6 refills | Status: DC

## 2022-02-21 DIAGNOSIS — N183 Chronic kidney disease, stage 3 unspecified: Secondary | ICD-10-CM | POA: Diagnosis not present

## 2022-02-21 DIAGNOSIS — S86191D Other injury of other muscle(s) and tendon(s) of posterior muscle group at lower leg level, right leg, subsequent encounter: Secondary | ICD-10-CM | POA: Diagnosis not present

## 2022-02-21 DIAGNOSIS — D509 Iron deficiency anemia, unspecified: Secondary | ICD-10-CM | POA: Diagnosis not present

## 2022-02-21 DIAGNOSIS — I129 Hypertensive chronic kidney disease with stage 1 through stage 4 chronic kidney disease, or unspecified chronic kidney disease: Secondary | ICD-10-CM | POA: Diagnosis not present

## 2022-03-04 ENCOUNTER — Other Ambulatory Visit (HOSPITAL_BASED_OUTPATIENT_CLINIC_OR_DEPARTMENT_OTHER): Payer: Self-pay

## 2022-03-10 ENCOUNTER — Encounter: Payer: Self-pay | Admitting: Family

## 2022-03-10 ENCOUNTER — Other Ambulatory Visit (HOSPITAL_BASED_OUTPATIENT_CLINIC_OR_DEPARTMENT_OTHER): Payer: Self-pay

## 2022-03-12 ENCOUNTER — Other Ambulatory Visit (HOSPITAL_BASED_OUTPATIENT_CLINIC_OR_DEPARTMENT_OTHER): Payer: Self-pay

## 2022-03-12 ENCOUNTER — Encounter: Payer: Self-pay | Admitting: Family

## 2022-03-12 ENCOUNTER — Inpatient Hospital Stay: Payer: Medicare Other | Attending: Hematology & Oncology

## 2022-03-12 VITALS — BP 130/70 | HR 74 | Temp 98.1°F

## 2022-03-12 DIAGNOSIS — Z452 Encounter for adjustment and management of vascular access device: Secondary | ICD-10-CM | POA: Diagnosis not present

## 2022-03-12 DIAGNOSIS — Z8551 Personal history of malignant neoplasm of bladder: Secondary | ICD-10-CM | POA: Diagnosis not present

## 2022-03-12 DIAGNOSIS — Z95828 Presence of other vascular implants and grafts: Secondary | ICD-10-CM

## 2022-03-12 MED ORDER — FLUAD QUADRIVALENT 0.5 ML IM PRSY
PREFILLED_SYRINGE | INTRAMUSCULAR | 0 refills | Status: DC
  Filled 2022-03-12: qty 0.5, 1d supply, fill #0

## 2022-03-12 MED ORDER — HEPARIN SOD (PORK) LOCK FLUSH 100 UNIT/ML IV SOLN
500.0000 [IU] | Freq: Once | INTRAVENOUS | Status: AC
  Administered 2022-03-12: 500 [IU] via INTRAVENOUS

## 2022-03-12 MED ORDER — SODIUM CHLORIDE 0.9% FLUSH
10.0000 mL | INTRAVENOUS | Status: DC | PRN
  Administered 2022-03-12: 10 mL via INTRAVENOUS

## 2022-03-12 MED ORDER — OZEMPIC (0.25 OR 0.5 MG/DOSE) 2 MG/3ML ~~LOC~~ SOPN
0.2500 mg | PEN_INJECTOR | SUBCUTANEOUS | 3 refills | Status: AC
  Filled 2022-03-12: qty 3, 56d supply, fill #0
  Filled 2022-03-19: qty 3, 28d supply, fill #0

## 2022-03-13 ENCOUNTER — Other Ambulatory Visit (HOSPITAL_BASED_OUTPATIENT_CLINIC_OR_DEPARTMENT_OTHER): Payer: Self-pay

## 2022-03-14 ENCOUNTER — Other Ambulatory Visit (HOSPITAL_BASED_OUTPATIENT_CLINIC_OR_DEPARTMENT_OTHER): Payer: Self-pay

## 2022-03-19 ENCOUNTER — Other Ambulatory Visit (HOSPITAL_BASED_OUTPATIENT_CLINIC_OR_DEPARTMENT_OTHER): Payer: Self-pay

## 2022-03-24 DIAGNOSIS — D509 Iron deficiency anemia, unspecified: Secondary | ICD-10-CM | POA: Diagnosis not present

## 2022-03-24 DIAGNOSIS — N183 Chronic kidney disease, stage 3 unspecified: Secondary | ICD-10-CM | POA: Diagnosis not present

## 2022-03-24 DIAGNOSIS — S86191D Other injury of other muscle(s) and tendon(s) of posterior muscle group at lower leg level, right leg, subsequent encounter: Secondary | ICD-10-CM | POA: Diagnosis not present

## 2022-03-24 DIAGNOSIS — I129 Hypertensive chronic kidney disease with stage 1 through stage 4 chronic kidney disease, or unspecified chronic kidney disease: Secondary | ICD-10-CM | POA: Diagnosis not present

## 2022-03-25 DIAGNOSIS — N184 Chronic kidney disease, stage 4 (severe): Secondary | ICD-10-CM | POA: Diagnosis not present

## 2022-03-31 DIAGNOSIS — N1832 Chronic kidney disease, stage 3b: Secondary | ICD-10-CM | POA: Diagnosis not present

## 2022-03-31 DIAGNOSIS — E119 Type 2 diabetes mellitus without complications: Secondary | ICD-10-CM | POA: Diagnosis not present

## 2022-03-31 DIAGNOSIS — Z794 Long term (current) use of insulin: Secondary | ICD-10-CM | POA: Diagnosis not present

## 2022-04-01 DIAGNOSIS — N183 Chronic kidney disease, stage 3 unspecified: Secondary | ICD-10-CM | POA: Diagnosis not present

## 2022-04-01 DIAGNOSIS — D631 Anemia in chronic kidney disease: Secondary | ICD-10-CM | POA: Diagnosis not present

## 2022-04-01 DIAGNOSIS — I129 Hypertensive chronic kidney disease with stage 1 through stage 4 chronic kidney disease, or unspecified chronic kidney disease: Secondary | ICD-10-CM | POA: Diagnosis not present

## 2022-04-01 DIAGNOSIS — R809 Proteinuria, unspecified: Secondary | ICD-10-CM | POA: Diagnosis not present

## 2022-04-14 DIAGNOSIS — D225 Melanocytic nevi of trunk: Secondary | ICD-10-CM | POA: Diagnosis not present

## 2022-04-14 DIAGNOSIS — L821 Other seborrheic keratosis: Secondary | ICD-10-CM | POA: Diagnosis not present

## 2022-04-14 DIAGNOSIS — D2262 Melanocytic nevi of left upper limb, including shoulder: Secondary | ICD-10-CM | POA: Diagnosis not present

## 2022-04-14 DIAGNOSIS — Z85828 Personal history of other malignant neoplasm of skin: Secondary | ICD-10-CM | POA: Diagnosis not present

## 2022-04-15 DIAGNOSIS — E1142 Type 2 diabetes mellitus with diabetic polyneuropathy: Secondary | ICD-10-CM | POA: Diagnosis not present

## 2022-04-15 DIAGNOSIS — B351 Tinea unguium: Secondary | ICD-10-CM | POA: Diagnosis not present

## 2022-04-30 ENCOUNTER — Encounter: Payer: Self-pay | Admitting: Family

## 2022-04-30 ENCOUNTER — Ambulatory Visit (INDEPENDENT_AMBULATORY_CARE_PROVIDER_SITE_OTHER): Payer: Medicare Other

## 2022-04-30 ENCOUNTER — Other Ambulatory Visit (HOSPITAL_BASED_OUTPATIENT_CLINIC_OR_DEPARTMENT_OTHER): Payer: Self-pay

## 2022-04-30 ENCOUNTER — Telehealth: Payer: Self-pay

## 2022-04-30 VITALS — HR 66 | Wt 215.0 lb

## 2022-04-30 DIAGNOSIS — Z0000 Encounter for general adult medical examination without abnormal findings: Secondary | ICD-10-CM

## 2022-04-30 NOTE — Telephone Encounter (Signed)
Patient would like AVS mailed to his home

## 2022-04-30 NOTE — Progress Notes (Signed)
Subjective:   Isaac [name] is a 66 y.o. male who presents for Medicare Annual/Subsequent preventive examination.  Review of Systems    AWV       Objective:    There were no vitals filed for this visit. There is no height or weight on file to calculate BMI.     01/15/2022    1:42 PM 10/01/2021    3:33 PM 10/01/2021    8:08 AM 09/30/2021    9:32 AM 09/24/2021    2:21 PM 06/04/2021   12:01 PM 04/18/2021    1:40 PM  Advanced Directives  Does Patient Have a Medical Advance Directive? _0  Yes Yes  Type of Advance Directive Living will;Healthcare Power of [place];Living will [place];Living will Living will;Healthcare Power of [place];Living will [place];Living will [place];Living will  Does patient want to make changes to medical advance directive? No - Patient declined No - Patient declined  No - Patient declined  No - Patient declined No - Patient declined  Copy of [place] in Chart?   No - copy requested No - copy requested  No - copy requested No - copy requested    Current Medications (verified) Outpatient Encounter Medications as of 04/30/2022  Medication Sig   acetaminophen (TYLENOL) 500 MG tablet Take 500 mg by mouth every 6 (six) hours as needed for moderate pain or headache.   ALPRAZolam (XANAX) 1 MG tablet Take 1 tablet (1 mg total) by mouth 3 (three) times daily as needed for anxiety.   glimepiride (AMARYL) 2 MG tablet TAKE 1 BY MOUTH DAILY BEFORE SUPPER (Patient taking differently: Take 2 mg by mouth 2 (two) times daily.)   influenza vaccine adjuvanted (FLUAD QUADRIVALENT) 0.5 ML injection Inject into the muscle.   insulin glargine, 1 Unit Dial, (TOUJEO) 300 UNIT/ML Solostar Pen Inject 40 Units into the skin 2 (two) times daily.    metoprolol succinate (TOPROL-XL) 25 MG 24 hr tablet TAKE 1 TABLET BY MOUTH AT  BEDTIME. GENERIC EQUIVALENT FOR TOPROL-XL   Multiple Vitamins-Minerals (MULTIVITAMIN WITH MINERALS) tablet Take 1 tablet by mouth daily.   nitroGLYCERIN (NITROSTAT) 0.4 MG SL tablet Place 1 tablet (0.4 mg total) under the tongue every 5 (five) minutes as needed for chest pain. Repeat every 5 minute up to 3 doses if no relief call 911.   ondansetron (ZOFRAN) 4 MG tablet Take 1 tablet (4 mg total) by mouth every 8 (eight) hours as needed.   oxyCODONE (OXY IR/ROXICODONE) 5 MG immediate release tablet Take 1 tablet (5 mg total) by mouth every 4 (four) hours as needed for moderate pain or severe pain.   oxyCODONE (OXY IR/ROXICODONE) 5 MG immediate release tablet Take 1 tablet by mouth every six to eight hours as needed for pain   oxyCODONE (OXY IR/ROXICODONE) 5 MG immediate release tablet Take 1 tablet (5 mg total) by mouth every 6 (six) to 8 (eight) hours as needed for pain.   pantoprazole (PROTONIX) 40 MG tablet Take 1 tablet (40 mg total) by mouth every morning.   PRESCRIPTION MEDICATION Inhale into the lungs at bedtime. CPAP   Probiotic Product (PROBIOTIC PO) Take 1 tablet by mouth daily.   ranolazine (RANEXA) 500 MG 12 hr tablet Take 1 tablet (500 mg total) by mouth 2 (two) times daily.   rOPINIRole (REQUIP) 2 MG tablet TAKE 1 TABLET BY MOUTH AT BEDTIME  rosuvastatin (CRESTOR) 10 MG tablet Take 1 tablet (10 mg total) by mouth every other day.   Semaglutide,0.25 or 0.5MG/DOS, (OZEMPIC, 0.25 OR 0.5 MG/DOSE,) 2 MG/1.5ML SOPN Inject 0.5 mg into the skin once a week.   Semaglutide,0.25 or 0.5MG/DOS, (OZEMPIC, 0.25 OR 0.5 MG/DOSE,) 2 MG/3ML SOPN Inject 0.25 mg into the skin once a week.   tadalafil (CIALIS) 20 MG tablet Take 1 tablet by mouth daily as needed   tadalafil (CIALIS) 5 MG tablet Take 1 tablet (5 mg total) by mouth daily.   tamsulosin (FLOMAX) 0.4 MG CAPS capsule Take 2 capsules by mouth everyday   Facility-Administered Encounter Medications as of 04/30/2022  Medication   LORazepam  (ATIVAN) injection 0.5 mg   sodium chloride flush (NS) 0.9 % injection 10 mL   sodium chloride flush (NS) 0.9 % injection 10 mL    Allergies (verified) Nsaids   History: Past Medical History:  Diagnosis Date   Arthritis    Bladder cancer [facility]) urologist-- dr Louis Meckel   11/ 2019 recurrent    Cancer of renal pelvis, right [facility])    oncologist-  dr [name]--  high grade papillary urothelial carcinoma, Stage III (T3 Nx M0) -- 02-12-2017  s/p  left nephrectomy--- completed chemotherapy 07-27-2017   Cataract    Chemotherapy-induced neuropathy ([place])    feet   CKD (chronic kidney disease) stage 3, GFR 30-59 ml/min (HCC)    Coronary artery disease    Coronary vasospasm ([place]) PER DR ROSS NOTE 03-22-2012--  INTER. 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/ reimplantation   Hyperlipidemia    Hypertension    Left renal artery stenosis (HCC)    Myocardial infarction ([place]) 2005   Nocturia more than twice per night    OSA on CPAP    Pneumonia    Renal artery stenosis (HCC)    Sleep apnea    Thrombocytopenia ([place])    Type 2 diabetes mellitus treated with insulin [facility])    endocrinologist-- dr Steffanie Dunn ([place] in [place])  lov note in epic    Urgency of urination    Wears glasses    Wisdom teeth extracted    Past Surgical History:  Procedure Laterality Date   APPENDECTOMY  08/03/2004   CALCANEAL OSTEOTOMY Left 10/01/2021   Procedure: LEFT FOOT MEDIAL DISPLACEMENT CALCANEAL OSTEOTOMY;  Surgeon: Erle Crocker, MD;  Location: [place];  Service: Orthopedics;  Laterality: Left;  LENGTH OF SURGERY: 120 MINUTES   CARDIAC CATHETERIZATION  x3  last one 06-17-2007   MILD NON-OBSTRUCTIVE CAD/ NORMAL LVF/ 30% LEFT RENAL ARTERY STENOSIS   CARDIOVASCULAR STRESS TEST  02/23/2013   Low risk nuclear study w/ small inferior wall infarct at mid and basal level with no ischemia/  normal LV function and wall motion , ef 57%   COLON SURGERY     CYSTO/ BILATERAL RETROGRADE PYELOGRAM/ RIGHT URETEROSCOPIC LASER FULGURATION URETERAL TUMOR  02/16/2008   CYSTO/ BLADDER BX  09-13-2008;  08-13-2007; 04-19-2007; 06-01-2006   CYSTO/ CYSTOGRAM/ URETEROSCOPY  02/21/2009   CYSTO/ RESECTION BLADDER TUMOR/ LEFT RETROGRADE PYELOGRAM/ RIGHT URETEROSCOPY  08/07/2010   TCC OF BLADDER   S/P DISTAL URETERECTOMY/ REIMPLANTATION   CYSTO/ RIGHT URETEROSCOPY/ BX URETERAL TUMOR  10/11/2008   CYSTOSCOPY W/ RETROGRADES  04/19/2012   Procedure: CYSTOSCOPY WITH RETROGRADE PYELOGRAM;  Surgeon: Bernestine Amass, MD;  [facility];  Service: Urology;  Laterality: Bilateral;  30 MINS Cysto, Biopsy, possible TURBT, Bilateral retrograde pyelograms with Mitomycin C instilation Post op    CYSTOSCOPY W/ RETROGRADES Left 03/25/2018   Procedure: CYSTOSCOPY WITH RETROGRADE PYELOGRAM;  Surgeon: [name], MD;  Location: [facility];  Service: Urology;  Laterality: Left;   CYSTOSCOPY WITH BIOPSY  04/07/2011   Procedure: CYSTOSCOPY WITH BIOPSY/ RIGHT URETEROSCOPY;  Surgeon: Bernestine Amass, MD;  [facility];  Service: Urology;  Laterality: N/A;    cystoscopy, cystogram, biopsy and fulgeration   CYSTOSCOPY WITH BIOPSY  12/29/2011   Procedure: CYSTOSCOPY WITH BIOPSY;  Surgeon: Bernestine Amass, MD;  Location: [facility];  Service: Urology;  Laterality: N/A;  30 MIN  WITH FULGERATION     CYSTOSCOPY WITH RETROGRADE PYELOGRAM, URETEROSCOPY AND STENT PLACEMENT Left 04/28/2018    Procedure: LEFT RETROGRADE PYELOGRAM, DIAGNOSTIC LEFT URETEROSCOPY;  Surgeon: [name], MD;  [facility];  Service: Urology;  Laterality: Left;   CYSTOSCOPY WITH STENT PLACEMENT N/A 03/09/2020   Procedure: CYSTOSCOPY WITH FIREFLYER  PLACEMENT;  Surgeon: [name], MD;  [facility];  Service: Urology;  Laterality: N/A;   ELBOW SURGERY  07/07/2003   RIGHT ELBOW ARTHROSCOPY W/ OPEN RECONSTRUCTION   FLEXOR TENDON REPAIR Left 10/01/2021   Procedure: POSTERIOR TIBIAL TENDON RECONSTRUCTION;  Surgeon: Erle Crocker, MD;  Location: [place];  Service: Orthopedics;  Laterality: Left;   IR FLUORO GUIDE PORT INSERTION RIGHT  04/01/2017   IR US GUIDE VASC ACCESS RIGHT  04/01/2017   JOINT REPLACEMENT     bilateral knees   KNEE ARTHROSCOPY W/ DEBRIDEMENT  02/08/2004   RIGHT KNEE   OSTOMY N/A 03/09/2020   Procedure: POSSIBLE OSTOMY;  Surgeon: [name], MD;  [facility];  Service: General;  Laterality: N/A;   PANENDOSCOPY N/A 03/09/2020   Procedure: PANENDOSCOPY RIGID;  Surgeon: [name], MD;  [facility];  Service: General;  Laterality: N/A;   REPAIR EXTENSOR TENDON Left 10/01/2021   Procedure: CALCANEONAVICULAR LIGAMENT RECONSTRUCTION;  Surgeon: Erle Crocker, MD;  Location: [place];  Service: Orthopedics;  Laterality: Left;   RHINOPLASTY W/ MODIFIED CARTILAGE GRAFT  05/17/2007   INTERNAL NASAL VALVE COLLAPSE/ OSA/ SEPTAL PERFERATION   RIGHT DISTAL URETERECTOMY W/ REIMPLANTATION  10/30/2008   TCC OF RIGHT DISTAL URETER   ROBOT ASSITED LAPAROSCOPIC NEPHROURETERECTOMY Right 02/12/2017   Procedure: XI ROBOT ASSITED LAPAROSCOPIC NEPHROURETERECTOMY;  Surgeon: [name], MD;  [facility];  Service: Urology;  Laterality: Right;   SHOULDER SURGERY  2005   RIGHT ROTATOR CUFF REPAIR   TOTAL KNEE ARTHROPLASTY Left 05/18/2015   TOTAL KNEE ARTHROPLASTY Left 05/18/2015   Procedure: TOTAL KNEE ARTHROPLASTY;  Surgeon: Earlie Server, MD;   Location: [place];  Service: Orthopedics;  Laterality: Left;   TOTAL KNEE ARTHROPLASTY Right 03/21/2016   Procedure: TOTAL KNEE ARTHROPLASTY;  Surgeon: Earlie Server, MD;  Location: [place];  Service: Orthopedics;  Laterality: Right;   TRANSTHORACIC ECHOCARDIOGRAM  02/03/2017   ef 66-06%, grade 1 diastolic dysfunction/  trivial  PR   TRANSURETHRAL RESECTION OF BLADDER TUMOR  01/13/2007   TRANSURETHRAL RESECTION OF BLADDER TUMOR  04/19/2012   Procedure: TRANSURETHRAL RESECTION OF BLADDER TUMOR (TURBT);  Surgeon: Bernestine Amass, MD;  Location[facility];  Service: Urology;  Laterality: N/A;   TRANSURETHRAL RESECTION OF BLADDER TUMOR N/A 03/25/2018   Procedure: TRANSURETHRAL RESECTION OF BLADDER TUMOR (TURBT);  Surgeon: [name], MD;  Location: [facility];  Service: Urology;  Laterality: N/A;   TRANSURETHRAL RESECTION OF BLADDER TUMOR N/A 04/28/2018   Procedure: TRANSURETHRAL RESECTION OF BLADDER TUMOR (TURBT);  Surgeon: [name], MD;  [facility];  Service: Urology;  Laterality: N/A;   umbillical hernia repair  2004   Family History  Problem Relation Age of Onset   Coronary artery disease Father    Diabetes Maternal Grandmother    Heart disease Paternal Grandfather    Colon cancer Neg Hx    Stomach cancer Neg Hx    Esophageal cancer Neg Hx    Rectal cancer Neg Hx    Social History   Socioeconomic History   Marital status: Single    Spouse name: Not on file   Number of children: Not on file   Years of education: Not on file   Highest education level: Not on file  Occupational History   Not on file  Tobacco Use   Smoking status: Former    Years: 20.00    Types: Cigarettes    Quit date: 05/13/2007    Years since quitting: 14.9   Smokeless tobacco: Never  Vaping Use   Vaping Use: Never used  Substance and Sexual Activity   Alcohol use: Not Currently    Alcohol/week: 3.0 - 4.0 standard drinks of alcohol    Types: 3 - 4 Cans  of beer per week   Drug use: No   Sexual activity: Not on file  Other Topics Concern   Not on file  Social History Narrative   Family history: Father died of an accident at age 54.  Father with history of CABG.  Mother died at age 41 apparently of accidental carbon monoxide poisoning when she locked herself out of the house and stayed in the running car to keep warm.  Maternal grandmother with history of diabetes.  1 sister.  Maternal grandfather with history of lung cancer.   Social Determinants of Health   Financial Resource Strain: Medium Risk (04/18/2021)   Overall Financial Resource Strain (CARDIA)    Difficulty of Paying Living Expenses: Somewhat hard  Food Insecurity: No Food Insecurity (04/18/2021)   Hunger Vital Sign    Worried About Running Out of Food in the Last Year: Never true    Ran Out of Food in the Last Year: Never true  Transportation Needs: No Transportation Needs (04/18/2021)   PRAPARE - Hydrologist (Medical): No    Lack of Transportation (Non-Medical): No  Physical Activity: Inactive (04/18/2021)   Exercise Vital Sign    Days of Exercise per Week: 0 days    Minutes of Exercise per Session: 0 min  Stress: Stress Concern Present (04/18/2021)   [place]    Feeling of Stress : To some extent  Social Connections: Moderately Isolated (04/18/2021)   Social Connection and Isolation Panel [NHANES]    Frequency of Communication with Friends and Family: More than three times a week    Frequency of Social Gatherings with Friends and  Family: More than three times a week    Attends Religious Services: 1 to 4 times per year    Active Member of Clubs or Organizations: No    Attends Archivist Meetings: Never    Marital Status: Never married    Tobacco Counseling Counseling given: Not Answered   Clinical Intake:                 Diabetic? yes          Activities of Daily Living    10/01/2021    3:33 PM 09/24/2021    2:24 PM  In your present state of health, do you have any difficulty performing the following activities:  Hearing? 1   Vision? 0   Difficulty concentrating or making decisions? 0   Walking or climbing stairs? 1   Dressing or bathing? 0   Doing errands, shopping? 0 0    Patient Care Team: [name], [name], MD as PCP - General (Internal Medicine) [name], MD as PCP - Cardiology (Cardiology) [name], MD as Consulting Physician (Colon and Rectal Surgery) [name], MD as Attending Physician (Urology) [name], MD as Consulting Physician (Gastroenterology)  Indicate any recent Medical Services you may have received from other than [facility] providers in the past year (date may be approximate).     Assessment:   This is a routine wellness examination for [place].  Hearing/Vision screen No results found.  Dietary issues and exercise activities discussed:     Goals Addressed   None   Depression Screen    04/15/2021    3:04 PM 04/11/2020   11:41 AM 03/14/2019    2:12 PM 12/12/2016    5:06 PM 10/14/2016    2:08 PM  PHQ 2/9 Scores  PHQ - 2 Score 1 2 0 2 4  PHQ- 9 Score    3 12    Fall Risk    04/18/2021    1:40 PM 04/15/2021    3:04 PM 04/11/2020   11:41 AM 03/14/2019    2:12 PM 10/14/2016    2:08 PM  Fall Risk   Falls in the past year? 0 0 0 0 Yes  Number falls in past yr: 0 0 0  1  Injury with Fall? 0 0 0  No  Comment     hit his head never seen provider for it  Risk for fall due to : No Fall Risks      Follow up Falls evaluation completed  Falls evaluation completed      [place]:  Any stairs in or around the home? Yes  If so, are there any without handrails? Yes  Home free of loose throw rugs in walkways, pet beds, electrical cords, etc? Yes  Adequate lighting in your home to reduce risk of falls? Yes   ASSISTIVE DEVICES UTILIZED TO  PREVENT FALLS:  Life alert? No  Use of a cane, walker or w/c? No  Grab bars in the bathroom? Yes  Shower chair or bench in shower? Yes  Elevated toilet seat or a handicapped toilet? Yes   TIMED UP AND GO:  Was the test performed?  N/A .  Length of time to ambulate 10 feet: N/A sec.   Gait slow and steady without use of assistive device  Cognitive Function:        04/18/2021    1:40 PM  6CIT Screen  What Year? 0 points  What month?  0 points  What time? 0 points  Count back from 20 0 points  Months in reverse 0 points  Repeat phrase 0 points  Total Score 0 points    Immunizations Immunization History  Administered Date(s) Administered   Fluad Quad(high Dose 65+) 02/25/2021, 03/12/2022   Influenza Inj Mdck Quad Pf 02/13/2016   Influenza Whole 02/09/2009, 05/19/2011   Influenza, Seasonal, Injecte, Preservative Fre 03/18/2013, 02/13/2014, 02/15/2015, 12/30/2016   Influenza,inj,Quad PF,6+ Mos 03/18/2013, 02/13/2014, 02/15/2015, 12/30/2016, 03/12/2018, 01/19/2019, 02/11/2019   Influenza,inj,quad, With Preservative 02/13/2016   PFIZER Comirnaty(Gray Top)Covid-19 Tri-Sucrose Vaccine 11/07/2020   PFIZER(Purple Top)SARS-COV-2 Vaccination 08/06/2019, 08/30/2019, 04/27/2020   PNEUMOCOCCAL CONJUGATE-20 11/19/2021   PPD Test 03/24/2016   Pfizer Covid-19 Vaccine Bivalent Booster 55yr & up 02/25/2021   Pneumococcal Conjugate-13 04/26/2015   Pneumococcal Polysaccharide-23 11/09/2008, 03/23/2016   Tdap 02/05/2004, 12/30/2016   Zoster Recombinat (Shingrix) 05/07/2020    TDAP status: Up to date  Flu Vaccine status: Up to date  Pneumococcal vaccine status: Up to date  Covid-19 vaccine status: Information provided on how to obtain vaccines.   Qualifies for Shingles Vaccine? Yes   Zostavax completed Yes   Shingrix Completed?: Yes  Screening Tests Health Maintenance  Topic Date Due   Zoster Vaccines- Shingrix (2 of 2) 07/02/2020   OPHTHALMOLOGY EXAM  05/31/2021   COVID-19  Vaccine (6 - 2023-24 season) 01/10/2022   HEMOGLOBIN A1C  03/27/2022   Diabetic kidney evaluation - Urine ACR  04/09/2022   FOOT EXAM  09/24/2022   Diabetic kidney evaluation - eGFR measurement  01/16/2023   COLONOSCOPY (Pts 45-472yrInsurance coverage will need to be confirmed)  03/09/2023   Medicare Annual Wellness (AWV)  05/01/2023   DTaP/Tdap/Td (3 - Td or Tdap) 12/31/2026   Pneumonia Vaccine 6537Years old  Completed   INFLUENZA VACCINE  Completed   Hepatitis C Screening  Completed   HPV VACCINES  Aged Out    Health Maintenance  Health Maintenance Due  Topic Date Due   Zoster Vaccines- Shingrix (2 of 2) 07/02/2020   OPHTHALMOLOGY EXAM  05/31/2021   COVID-19 Vaccine (6 - 2023-24 season) 01/10/2022   HEMOGLOBIN A1C  03/27/2022   Diabetic kidney evaluation - Urine ACR  04/09/2022    Colorectal cancer screening: Type of screening: Colonoscopy. Completed 03/08/20. Repeat every 3 years  Lung Cancer Screening: (Low Dose CT Chest recommended if Age 66-80ears, 30 pack-year currently smoking OR have quit w/in 15years.) does not qualify.   Lung Cancer Screening Referral: N/A  Additional Screening:  Hepatitis C Screening: does not qualify; Completed N/A  Vision Screening: Recommended annual ophthalmology exams for early detection of glaucoma and other disorders of the eye. Is the patient up to date with their annual eye exam?  Yes  Who is the provider or what is the name of the office in which the patient attends annual eye exams? Yes If pt is not established with a provider, would they like to be referred to a provider to establish care? No .   Dental Screening: Recommended annual dental exams for proper oral hygiene  Community Resource Referral / Chronic Care Management: CRR required this visit?  No   CCM required this visit?  No      Plan:     I have personally reviewed and noted the following in the patient's chart:   Medical and social history Use of alcohol,  tobacco or illicit drugs  Current medications and supplements including opioid prescriptions. Patient is not currently taking opioid prescriptions. Functional  ability and status Nutritional status Physical activity Advanced directives List of other physicians Hospitalizations, surgeries, and ER visits in previous 12 months Vitals Screenings to include cognitive, depression, and falls Referrals and appointments  In addition, I have reviewed and discussed with patient certain preventive protocols, quality metrics, and best practice recommendations. A written personalized care plan for preventive services as well as general preventive health recommendations were provided to patient.     Pearlean Brownie, [place]   04/30/2022   Nurse Notes:

## 2022-05-16 ENCOUNTER — Other Ambulatory Visit: Payer: Self-pay

## 2022-05-16 ENCOUNTER — Inpatient Hospital Stay: Payer: Medicare Other | Admitting: Hematology & Oncology

## 2022-05-16 ENCOUNTER — Inpatient Hospital Stay: Payer: Medicare Other | Attending: Hematology & Oncology

## 2022-05-16 ENCOUNTER — Inpatient Hospital Stay: Payer: Medicare Other

## 2022-05-16 ENCOUNTER — Encounter: Payer: Self-pay | Admitting: Hematology & Oncology

## 2022-05-16 VITALS — BP 148/79 | HR 62 | Temp 98.7°F | Resp 19 | Ht 69.0 in | Wt 215.0 lb

## 2022-05-16 DIAGNOSIS — Z452 Encounter for adjustment and management of vascular access device: Secondary | ICD-10-CM | POA: Diagnosis not present

## 2022-05-16 DIAGNOSIS — Z95828 Presence of other vascular implants and grafts: Secondary | ICD-10-CM

## 2022-05-16 DIAGNOSIS — C651 Malignant neoplasm of right renal pelvis: Secondary | ICD-10-CM | POA: Diagnosis not present

## 2022-05-16 DIAGNOSIS — Z8551 Personal history of malignant neoplasm of bladder: Secondary | ICD-10-CM | POA: Diagnosis not present

## 2022-05-16 LAB — CBC WITH DIFFERENTIAL (CANCER CENTER ONLY)
Abs Immature Granulocytes: 0.02 10*3/uL (ref 0.00–0.07)
Basophils Absolute: 0 10*3/uL (ref 0.0–0.1)
Basophils Relative: 1 %
Eosinophils Absolute: 0.1 10*3/uL (ref 0.0–0.5)
Eosinophils Relative: 2 %
HCT: 40.7 % (ref 39.0–52.0)
Hemoglobin: 14 g/dL (ref 13.0–17.0)
Immature Granulocytes: 1 %
Lymphocytes Relative: 29 %
Lymphs Abs: 1.2 10*3/uL (ref 0.7–4.0)
MCH: 34.1 pg — ABNORMAL HIGH (ref 26.0–34.0)
MCHC: 34.4 g/dL (ref 30.0–36.0)
MCV: 99 fL (ref 80.0–100.0)
Monocytes Absolute: 0.4 10*3/uL (ref 0.1–1.0)
Monocytes Relative: 10 %
Neutro Abs: 2.4 10*3/uL (ref 1.7–7.7)
Neutrophils Relative %: 57 %
Platelet Count: 124 10*3/uL — ABNORMAL LOW (ref 150–400)
RBC: 4.11 MIL/uL — ABNORMAL LOW (ref 4.22–5.81)
RDW: 12.7 % (ref 11.5–15.5)
WBC Count: 4 10*3/uL (ref 4.0–10.5)
nRBC: 0 % (ref 0.0–0.2)

## 2022-05-16 LAB — CMP (CANCER CENTER ONLY)
ALT: 25 U/L (ref 0–44)
AST: 18 U/L (ref 15–41)
Albumin: 4.3 g/dL (ref 3.5–5.0)
Alkaline Phosphatase: 50 U/L (ref 38–126)
Anion gap: 8 (ref 5–15)
BUN: 24 mg/dL — ABNORMAL HIGH (ref 8–23)
CO2: 27 mmol/L (ref 22–32)
Calcium: 9 mg/dL (ref 8.9–10.3)
Chloride: 101 mmol/L (ref 98–111)
Creatinine: 1.84 mg/dL — ABNORMAL HIGH (ref 0.61–1.24)
GFR, Estimated: 40 mL/min — ABNORMAL LOW (ref >60)
Glucose, Bld: 189 mg/dL — ABNORMAL HIGH (ref 70–99)
Potassium: 3.9 mmol/L (ref 3.5–5.1)
Sodium: 136 mmol/L (ref 135–145)
Total Bilirubin: 0.6 mg/dL (ref 0.3–1.2)
Total Protein: 6.6 g/dL (ref 6.5–8.1)

## 2022-05-16 LAB — LACTATE DEHYDROGENASE: LDH: 136 U/L (ref 98–192)

## 2022-05-16 MED ORDER — HEPARIN SOD (PORK) LOCK FLUSH 100 UNIT/ML IV SOLN
500.0000 [IU] | Freq: Once | INTRAVENOUS | Status: AC
  Administered 2022-05-16: 500 [IU] via INTRAVENOUS

## 2022-05-16 MED ORDER — SODIUM CHLORIDE 0.9% FLUSH
10.0000 mL | Freq: Once | INTRAVENOUS | Status: AC
  Administered 2022-05-16: 10 mL via INTRAVENOUS

## 2022-05-16 NOTE — Progress Notes (Signed)
Hematology and Oncology Follow Up Visit  Isaac [name] [id number] Sep 22, 1955 67 y.o. 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 is here today for follow-up.  We last saw him back in September.  He had left ankle surgery back in May.  He is recovering from this.  He has been active doing quite well.  He is trying to watch his blood sugars.  His blood sugar today was 189.  He has had no problems with bowels or bladder.  There is been no bleeding.  His last PET scan was done back in July 2022.  Everything looked fine.  He has had no fever.  There is been no problems with COVID.  He does have little bit of neuropathy from the diabetes.  He has had no rashes.  He has had no swollen lymph nodes.  Overall, I would say his performance status is probably ECOG 1.     Medications:  Allergies as of 05/16/2022       Reactions   Nsaids Other (See Comments)   Has 1 kidney left        Medication List        Accurate as of May 16, 2022  3:30 PM. If you have any questions, ask your nurse or doctor.          acetaminophen 500 MG tablet Commonly known as: TYLENOL Take 500 mg by mouth every 6 (six) hours as needed for moderate pain or headache.   ALPRAZolam 1 MG tablet Commonly known as: XANAX Take 1 tablet (1 mg total) by mouth 3 (three) times daily as needed for anxiety.   Fluad Quadrivalent 0.5 ML injection Generic drug: influenza vaccine adjuvanted Inject into the muscle.   glimepiride 2 MG tablet Commonly known as: AMARYL TAKE 1 BY MOUTH DAILY BEFORE SUPPER What changed:  how much to take how to take this when to take this additional instructions   insulin glargine (1 Unit Dial) 300  UNIT/ML Solostar Pen Commonly known as: TOUJEO Inject 40 Units into the skin 2 (two) times daily.   metoprolol succinate 25 MG 24 hr tablet Commonly known as: TOPROL-XL TAKE 1 TABLET BY MOUTH AT BEDTIME. GENERIC EQUIVALENT FOR TOPROL-XL   multivitamin with minerals tablet Take 1 tablet by mouth daily.   nitroGLYCERIN 0.4 MG SL tablet Commonly known as: NITROSTAT Place 1 tablet (0.4 mg total) under the tongue every 5 (five) minutes as needed for chest pain. Repeat every 5 minute up to 3 doses if no relief call 911.   ondansetron 4 MG tablet Commonly known as: ZOFRAN Take 1 tablet (4 mg total) by mouth every 8 (eight) hours as needed.   oxyCODONE 5 MG immediate release tablet Commonly known as: Oxy IR/ROXICODONE Take 1 tablet (5 mg total) by mouth every 6 (six) to 8 (eight) hours as needed for pain.   Ozempic (0.25 or 0.5 MG/DOSE) 2 MG/1.5ML Sopn Generic drug: Semaglutide(0.25 or 0.'5MG'$ /DOS) Inject 0.5 mg into the skin once a week.   Ozempic (0.25 or 0.5 MG/DOSE) 2 MG/3ML Sopn Generic drug: Semaglutide(0.25 or 0.'5MG'$ /DOS) Inject 0.25 mg into the skin once a week.   pantoprazole 40 MG tablet Commonly known as: PROTONIX Take 1 tablet (40 mg  total) by mouth every morning.   PRESCRIPTION MEDICATION Inhale into the lungs at bedtime. CPAP   PROBIOTIC PO Take 1 tablet by mouth daily.   ranolazine 500 MG 12 hr tablet Commonly known as: Ranexa Take 1 tablet (500 mg total) by mouth 2 (two) times daily.   rOPINIRole 2 MG tablet Commonly known as: REQUIP TAKE 1 TABLET BY MOUTH AT BEDTIME   rosuvastatin 10 MG tablet Commonly known as: CRESTOR Take 1 tablet (10 mg total) by mouth every other day.   tadalafil 20 MG tablet Commonly known as: CIALIS Take 1 tablet by mouth daily as needed   tadalafil 5 MG tablet Commonly known as: CIALIS Take 1 tablet (5 mg total) by mouth daily.   tamsulosin 0.4 MG Caps capsule Commonly known as: FLOMAX Take 2 capsules by mouth everyday         Allergies:  Allergies  Allergen Reactions   Nsaids Other (See Comments)    Has 1 kidney left    Past Medical History, Surgical history, Social history, and Family History were reviewed and updated.  Review of Systems: Review of Systems  Constitutional: Negative.   HENT: Negative.    Eyes: Negative.   Respiratory: Negative.    Cardiovascular: Negative.   Gastrointestinal: Negative.   Genitourinary: Negative.   Musculoskeletal:  Positive for back pain.  Skin: Negative.   Neurological:  Positive for headaches.  Endo/Heme/Allergies: Negative.   Psychiatric/Behavioral: Negative.       Physical Exam:  vitals were not taken for this visit.   Wt Readings from Last 3 Encounters:  04/30/22 215 lb (97.5 kg)  01/15/22 213 lb (96.6 kg)  10/01/21 219 lb (99.3 kg)    Physical Exam Vitals reviewed.  HENT:     Head: Normocephalic and atraumatic.  Eyes:     Pupils: Pupils are equal, round, and reactive to light.  Cardiovascular:     Rate and Rhythm: Normal rate and regular rhythm.     Heart sounds: Normal heart sounds.  Pulmonary:     Effort: Pulmonary effort is normal.     Breath sounds: Normal breath sounds.  Abdominal:     General: Bowel sounds are normal.     Palpations: Abdomen is soft.  Musculoskeletal:        General: No tenderness or deformity. Normal range of motion.     Cervical back: Normal range of motion.     Comments: He has a brace on his left ankle.  There may be a little bit of swelling in the left lower leg.  Lymphadenopathy:     Cervical: No cervical adenopathy.  Skin:    General: Skin is warm and dry.     Findings: No erythema or rash.  Neurological:     Mental Status: He is alert and oriented to person, place, and time.  Psychiatric:        Behavior: Behavior normal.        Thought Content: Thought content normal.        Judgment: Judgment normal.     Lab Results  Component Value Date   WBC 4.0 05/16/2022   HGB 14.0 05/16/2022    HCT 40.7 05/16/2022   MCV 99.0 05/16/2022   PLT 124 (L) 05/16/2022   Lab Results  Component Value Date   FERRITIN 230 06/04/2021   IRON 129 06/04/2021   TIBC 329 06/04/2021   UIBC 200 06/04/2021   IRONPCTSAT 39 06/04/2021   Lab Results  Component Value Date  RETICCTPCT 2.0 06/04/2021   RBC 4.11 (L) 05/16/2022   No results found for: "KPAFRELGTCHN", "LAMBDASER", "KAPLAMBRATIO" Lab Results  Component Value Date   IGA 232 01/25/2015   No results found for: "TOTALPROTELP", "ALBUMINELP", "A1GS", "A2GS", "BETS", "BETA2SER", "GAMS", "MSPIKE", "SPEI"   Chemistry      Component Value Date/Time   NA 132 (L) 01/15/2022 1320   NA 139 09/03/2021 1456   NA 134 05/04/2017 0925   K 4.5 01/15/2022 1320   K 4.5 05/04/2017 0925   CL 100 01/15/2022 1320   CL 97 (L) 05/04/2017 0925   CO2 23 01/15/2022 1320   CO2 23 05/04/2017 0925   BUN 27 (H) 01/15/2022 1320   BUN 21 09/03/2021 1456   BUN 28 (H) 05/04/2017 0925   CREATININE 1.89 (H) 01/15/2022 1320   CREATININE 1.95 (H) 11/21/2021 1041   GLU 86 03/31/2016 0000      Component Value Date/Time   CALCIUM 9.0 01/15/2022 1320   CALCIUM 9.3 05/04/2017 0925   ALKPHOS 51 01/15/2022 1320   ALKPHOS 58 05/04/2017 0925   AST 19 01/15/2022 1320   ALT 22 01/15/2022 1320   ALT 90 (H) 05/04/2017 0925   BILITOT 0.7 01/15/2022 1320       Impression and Plan: [name] is a very pleasant 67 yo caucasian gentleman with infiltrating high grade papillary urothelial carcinoma with right nephrectomy in October 2018 and adjuvant chemotherapy.   I really had to leave that the chance of his cancer coming back is going to be less than 10%.  I do not see that we have to do any scans on him.  For right now, we will just plan to get him back in another 3 months.  If he does have any change in his physical status or with labs, then we can certainly do another scan on him.    [name], MD 1/5/20243:30 PM

## 2022-05-24 DIAGNOSIS — N183 Chronic kidney disease, stage 3 unspecified: Secondary | ICD-10-CM | POA: Diagnosis not present

## 2022-05-24 DIAGNOSIS — S86191D Other injury of other muscle(s) and tendon(s) of posterior muscle group at lower leg level, right leg, subsequent encounter: Secondary | ICD-10-CM | POA: Diagnosis not present

## 2022-05-24 DIAGNOSIS — I129 Hypertensive chronic kidney disease with stage 1 through stage 4 chronic kidney disease, or unspecified chronic kidney disease: Secondary | ICD-10-CM | POA: Diagnosis not present

## 2022-05-24 DIAGNOSIS — D509 Iron deficiency anemia, unspecified: Secondary | ICD-10-CM | POA: Diagnosis not present

## 2022-05-27 DIAGNOSIS — H5203 Hypermetropia, bilateral: Secondary | ICD-10-CM | POA: Diagnosis not present

## 2022-05-27 DIAGNOSIS — Z794 Long term (current) use of insulin: Secondary | ICD-10-CM | POA: Diagnosis not present

## 2022-05-27 DIAGNOSIS — H25013 Cortical age-related cataract, bilateral: Secondary | ICD-10-CM | POA: Diagnosis not present

## 2022-05-27 DIAGNOSIS — H52203 Unspecified astigmatism, bilateral: Secondary | ICD-10-CM | POA: Diagnosis not present

## 2022-05-27 DIAGNOSIS — E119 Type 2 diabetes mellitus without complications: Secondary | ICD-10-CM | POA: Diagnosis not present

## 2022-05-27 DIAGNOSIS — H524 Presbyopia: Secondary | ICD-10-CM | POA: Diagnosis not present

## 2022-05-27 DIAGNOSIS — H2513 Age-related nuclear cataract, bilateral: Secondary | ICD-10-CM | POA: Diagnosis not present

## 2022-05-30 ENCOUNTER — Encounter: Payer: Self-pay | Admitting: Family

## 2022-06-09 DIAGNOSIS — E1142 Type 2 diabetes mellitus with diabetic polyneuropathy: Secondary | ICD-10-CM | POA: Diagnosis not present

## 2022-06-09 DIAGNOSIS — M21622 Bunionette of left foot: Secondary | ICD-10-CM | POA: Diagnosis not present

## 2022-06-09 DIAGNOSIS — M21621 Bunionette of right foot: Secondary | ICD-10-CM | POA: Diagnosis not present

## 2022-06-10 ENCOUNTER — Encounter: Payer: Self-pay | Admitting: Family

## 2022-06-10 ENCOUNTER — Other Ambulatory Visit (HOSPITAL_BASED_OUTPATIENT_CLINIC_OR_DEPARTMENT_OTHER): Payer: Self-pay

## 2022-06-10 MED ORDER — DAPAGLIFLOZIN PROPANEDIOL 10 MG PO TABS
10.0000 mg | ORAL_TABLET | Freq: Every day | ORAL | 2 refills | Status: DC
  Filled 2022-06-10: qty 30, 30d supply, fill #0
  Filled 2022-07-03: qty 30, 30d supply, fill #1

## 2022-06-11 ENCOUNTER — Encounter: Payer: Self-pay | Admitting: Family

## 2022-06-24 DIAGNOSIS — S86191D Other injury of other muscle(s) and tendon(s) of posterior muscle group at lower leg level, right leg, subsequent encounter: Secondary | ICD-10-CM | POA: Diagnosis not present

## 2022-06-24 DIAGNOSIS — I129 Hypertensive chronic kidney disease with stage 1 through stage 4 chronic kidney disease, or unspecified chronic kidney disease: Secondary | ICD-10-CM | POA: Diagnosis not present

## 2022-06-24 DIAGNOSIS — D509 Iron deficiency anemia, unspecified: Secondary | ICD-10-CM | POA: Diagnosis not present

## 2022-06-24 DIAGNOSIS — N183 Chronic kidney disease, stage 3 unspecified: Secondary | ICD-10-CM | POA: Diagnosis not present

## 2022-06-27 ENCOUNTER — Inpatient Hospital Stay: Payer: Medicare Other | Attending: Hematology & Oncology

## 2022-06-27 VITALS — BP 145/69 | HR 75 | Temp 98.3°F | Resp 18

## 2022-06-27 DIAGNOSIS — Z8551 Personal history of malignant neoplasm of bladder: Secondary | ICD-10-CM | POA: Diagnosis not present

## 2022-06-27 DIAGNOSIS — Z452 Encounter for adjustment and management of vascular access device: Secondary | ICD-10-CM | POA: Insufficient documentation

## 2022-06-27 DIAGNOSIS — Z95828 Presence of other vascular implants and grafts: Secondary | ICD-10-CM

## 2022-06-27 MED ORDER — SODIUM CHLORIDE 0.9% FLUSH
10.0000 mL | Freq: Once | INTRAVENOUS | Status: AC
  Administered 2022-06-27: 10 mL via INTRAVENOUS

## 2022-06-27 MED ORDER — HEPARIN SOD (PORK) LOCK FLUSH 100 UNIT/ML IV SOLN
500.0000 [IU] | Freq: Once | INTRAVENOUS | Status: AC
  Administered 2022-06-27: 500 [IU] via INTRAVENOUS

## 2022-06-27 NOTE — Patient Instructions (Signed)

## 2022-06-29 ENCOUNTER — Other Ambulatory Visit: Payer: Self-pay | Admitting: Internal Medicine

## 2022-06-29 DIAGNOSIS — G2581 Restless legs syndrome: Secondary | ICD-10-CM

## 2022-06-30 NOTE — Telephone Encounter (Signed)
scheduled

## 2022-07-03 ENCOUNTER — Other Ambulatory Visit (HOSPITAL_BASED_OUTPATIENT_CLINIC_OR_DEPARTMENT_OTHER): Payer: Self-pay

## 2022-07-03 ENCOUNTER — Encounter: Payer: Self-pay | Admitting: Family

## 2022-07-03 DIAGNOSIS — Z794 Long term (current) use of insulin: Secondary | ICD-10-CM | POA: Diagnosis not present

## 2022-07-03 DIAGNOSIS — E1122 Type 2 diabetes mellitus with diabetic chronic kidney disease: Secondary | ICD-10-CM | POA: Diagnosis not present

## 2022-07-03 DIAGNOSIS — E1165 Type 2 diabetes mellitus with hyperglycemia: Secondary | ICD-10-CM | POA: Diagnosis not present

## 2022-07-03 DIAGNOSIS — N1832 Chronic kidney disease, stage 3b: Secondary | ICD-10-CM | POA: Diagnosis not present

## 2022-07-03 DIAGNOSIS — Z1330 Encounter for screening examination for mental health and behavioral disorders, unspecified: Secondary | ICD-10-CM | POA: Diagnosis not present

## 2022-07-07 ENCOUNTER — Encounter: Payer: Self-pay | Admitting: Family

## 2022-07-07 ENCOUNTER — Other Ambulatory Visit (HOSPITAL_BASED_OUTPATIENT_CLINIC_OR_DEPARTMENT_OTHER): Payer: Self-pay

## 2022-07-14 ENCOUNTER — Other Ambulatory Visit (HOSPITAL_BASED_OUTPATIENT_CLINIC_OR_DEPARTMENT_OTHER): Payer: Self-pay

## 2022-07-15 DIAGNOSIS — M25572 Pain in left ankle and joints of left foot: Secondary | ICD-10-CM | POA: Diagnosis not present

## 2022-07-15 DIAGNOSIS — M7752 Other enthesopathy of left foot: Secondary | ICD-10-CM | POA: Diagnosis not present

## 2022-07-15 DIAGNOSIS — M21621 Bunionette of right foot: Secondary | ICD-10-CM | POA: Diagnosis not present

## 2022-07-15 DIAGNOSIS — B351 Tinea unguium: Secondary | ICD-10-CM | POA: Diagnosis not present

## 2022-07-15 DIAGNOSIS — E1142 Type 2 diabetes mellitus with diabetic polyneuropathy: Secondary | ICD-10-CM | POA: Diagnosis not present

## 2022-07-22 ENCOUNTER — Encounter: Payer: Self-pay | Admitting: Family

## 2022-07-22 ENCOUNTER — Other Ambulatory Visit (HOSPITAL_BASED_OUTPATIENT_CLINIC_OR_DEPARTMENT_OTHER): Payer: Self-pay

## 2022-07-22 ENCOUNTER — Telehealth: Payer: Self-pay | Admitting: Pharmacist

## 2022-07-22 NOTE — Progress Notes (Unsigned)
[facility]) Care Management  [place]   07/22/2022  RAFAY SARE 07/01/1955 [id number]  Reason for referral: medication assistance  Referral source: Patient Referral medication(s): Toujeo,  Farxiga, Ozempic Current insurance:[employer] [place]  Medication Assistance Findings:  Medication assistance needs identified: Patient was called about medication adherence to rosuvastatin.  While speaking with him, he expressed concern about paying for [place], Ozempic, and Toujeo.  He had 90 day supplies of Ozempic and Toujeo filled this year and a 30 day supply of [place].  When he went to get the [place] this month, he was told it would be $400. Based on the retail cost of [place], [place], [place] and the rest of his medications, the patient may very well be in the Donut hole.   After financial review, he will most likely qualify to receive Toujeo through [employer], Ozempic from [employer], and [place] through [employer].  Patient communicated understanding about the required financial documentation.   Patient was educated about how Medicare Advantage Drug Coverage (donut hole, deductibles etc)   Additional medication assistance options reviewed with patient as warranted:  No other options identified  Plan: I will route patient assistance letter to [place] technician who will coordinate patient assistance program application process for medications listed above.  [facility] pharmacy technician will assist with obtaining all required documents from both patient and provider(s) and submit application(s) once completed.    Elayne Guerin, PharmD, [place] Clinical Pharmacist [phone]

## 2022-07-23 DIAGNOSIS — D509 Iron deficiency anemia, unspecified: Secondary | ICD-10-CM | POA: Diagnosis not present

## 2022-07-23 DIAGNOSIS — I129 Hypertensive chronic kidney disease with stage 1 through stage 4 chronic kidney disease, or unspecified chronic kidney disease: Secondary | ICD-10-CM | POA: Diagnosis not present

## 2022-07-23 DIAGNOSIS — R2689 Other abnormalities of gait and mobility: Secondary | ICD-10-CM | POA: Diagnosis not present

## 2022-07-23 DIAGNOSIS — N183 Chronic kidney disease, stage 3 unspecified: Secondary | ICD-10-CM | POA: Diagnosis not present

## 2022-07-23 DIAGNOSIS — S86191D Other injury of other muscle(s) and tendon(s) of posterior muscle group at lower leg level, right leg, subsequent encounter: Secondary | ICD-10-CM | POA: Diagnosis not present

## 2022-07-23 DIAGNOSIS — E1142 Type 2 diabetes mellitus with diabetic polyneuropathy: Secondary | ICD-10-CM | POA: Diagnosis not present

## 2022-07-25 DIAGNOSIS — G4733 Obstructive sleep apnea (adult) (pediatric): Secondary | ICD-10-CM | POA: Diagnosis not present

## 2022-07-28 DIAGNOSIS — E1142 Type 2 diabetes mellitus with diabetic polyneuropathy: Secondary | ICD-10-CM | POA: Diagnosis not present

## 2022-07-28 DIAGNOSIS — R2689 Other abnormalities of gait and mobility: Secondary | ICD-10-CM | POA: Diagnosis not present

## 2022-07-29 ENCOUNTER — Other Ambulatory Visit: Payer: Self-pay

## 2022-07-29 ENCOUNTER — Other Ambulatory Visit (HOSPITAL_BASED_OUTPATIENT_CLINIC_OR_DEPARTMENT_OTHER): Payer: Self-pay

## 2022-07-29 ENCOUNTER — Encounter: Payer: Self-pay | Admitting: Family

## 2022-07-29 DIAGNOSIS — R3915 Urgency of urination: Secondary | ICD-10-CM | POA: Diagnosis not present

## 2022-07-29 DIAGNOSIS — C678 Malignant neoplasm of overlapping sites of bladder: Secondary | ICD-10-CM | POA: Diagnosis not present

## 2022-07-29 DIAGNOSIS — N401 Enlarged prostate with lower urinary tract symptoms: Secondary | ICD-10-CM | POA: Diagnosis not present

## 2022-07-29 MED ORDER — METOPROLOL SUCCINATE ER 25 MG PO TB24: ORAL_TABLET | ORAL | 1 refills | Status: DC

## 2022-07-29 MED ORDER — DIAZEPAM 10 MG PO TABS
10.0000 mg | ORAL_TABLET | Freq: Once | ORAL | 0 refills | Status: AC
  Filled 2022-07-29: qty 2, 2d supply, fill #0

## 2022-07-29 MED ORDER — PANTOPRAZOLE SODIUM 40 MG PO TBEC: 40.0000 mg | DELAYED_RELEASE_TABLET | Freq: Every morning | ORAL | 1 refills | Status: DC

## 2022-07-29 MED ORDER — TADALAFIL 20 MG PO TABS
20.0000 mg | ORAL_TABLET | Freq: Every day | ORAL | 3 refills | Status: DC | PRN
  Filled 2022-07-29: qty 30, 30d supply, fill #0
  Filled 2022-08-26: qty 30, 30d supply, fill #1
  Filled 2022-10-30: qty 30, 30d supply, fill #2
  Filled 2022-12-10: qty 30, 30d supply, fill #3

## 2022-07-29 NOTE — Addendum Note (Signed)
Addended by: Carter Kitten D on: 07/29/2022 03:45 PM   Modules accepted: Orders

## 2022-07-31 DIAGNOSIS — E1142 Type 2 diabetes mellitus with diabetic polyneuropathy: Secondary | ICD-10-CM | POA: Diagnosis not present

## 2022-07-31 DIAGNOSIS — R2689 Other abnormalities of gait and mobility: Secondary | ICD-10-CM | POA: Diagnosis not present

## 2022-08-01 DIAGNOSIS — N183 Chronic kidney disease, stage 3 unspecified: Secondary | ICD-10-CM | POA: Diagnosis not present

## 2022-08-01 DIAGNOSIS — R809 Proteinuria, unspecified: Secondary | ICD-10-CM | POA: Diagnosis not present

## 2022-08-01 DIAGNOSIS — D631 Anemia in chronic kidney disease: Secondary | ICD-10-CM | POA: Diagnosis not present

## 2022-08-01 DIAGNOSIS — I129 Hypertensive chronic kidney disease with stage 1 through stage 4 chronic kidney disease, or unspecified chronic kidney disease: Secondary | ICD-10-CM | POA: Diagnosis not present

## 2022-08-03 LAB — LAB REPORT - SCANNED
Albumin, Urine POC: 30.3
Albumin/Creatinine Ratio, Urine, POC: 50
Creatinine, POC: 60.9 mg/dL
EGFR: 39

## 2022-08-04 ENCOUNTER — Telehealth: Payer: Self-pay | Admitting: Pharmacy Technician

## 2022-08-04 DIAGNOSIS — Z596 Low income: Secondary | ICD-10-CM

## 2022-08-04 DIAGNOSIS — K080 Exfoliation of teeth due to systemic causes: Secondary | ICD-10-CM | POA: Diagnosis not present

## 2022-08-04 NOTE — Progress Notes (Signed)
[facility])                                            [place] Team    08/04/2022  Isaac [name] 1955-12-24 [id number]                                      Medication Assistance Referral  Referral From: [place]  Medication/Company: [employer]  Patient application portion:  Education officer, museum portion: Faxed  to Dr. Susette Racer Provider address/fax verified via: Office website  Medication/Company: Larna Daughters / [employer] Patient application portion:  Education officer, museum portion: Faxed  to Dr. Susette Racer Provider address/fax verified via: Office website  Medication/Company: Wilder Glade / [employer] Patient application portion:  Mailed Provider application portion: Faxed  to Dr. Gean Quint Provider address/fax verified via: Office website  Aluel Schwarz P. [place]acarolina Evelyn, [place]  [phone]

## 2022-08-05 DIAGNOSIS — R2689 Other abnormalities of gait and mobility: Secondary | ICD-10-CM | POA: Diagnosis not present

## 2022-08-05 DIAGNOSIS — E1142 Type 2 diabetes mellitus with diabetic polyneuropathy: Secondary | ICD-10-CM | POA: Diagnosis not present

## 2022-08-07 DIAGNOSIS — E1142 Type 2 diabetes mellitus with diabetic polyneuropathy: Secondary | ICD-10-CM | POA: Diagnosis not present

## 2022-08-07 DIAGNOSIS — R2689 Other abnormalities of gait and mobility: Secondary | ICD-10-CM | POA: Diagnosis not present

## 2022-08-12 DIAGNOSIS — E1142 Type 2 diabetes mellitus with diabetic polyneuropathy: Secondary | ICD-10-CM | POA: Diagnosis not present

## 2022-08-12 DIAGNOSIS — R2689 Other abnormalities of gait and mobility: Secondary | ICD-10-CM | POA: Diagnosis not present

## 2022-08-14 DIAGNOSIS — E1142 Type 2 diabetes mellitus with diabetic polyneuropathy: Secondary | ICD-10-CM | POA: Diagnosis not present

## 2022-08-14 DIAGNOSIS — R2689 Other abnormalities of gait and mobility: Secondary | ICD-10-CM | POA: Diagnosis not present

## 2022-08-15 ENCOUNTER — Inpatient Hospital Stay: Payer: Medicare Other

## 2022-08-15 ENCOUNTER — Inpatient Hospital Stay: Payer: Medicare Other | Admitting: Oncology

## 2022-08-19 DIAGNOSIS — E1142 Type 2 diabetes mellitus with diabetic polyneuropathy: Secondary | ICD-10-CM | POA: Diagnosis not present

## 2022-08-19 DIAGNOSIS — R2689 Other abnormalities of gait and mobility: Secondary | ICD-10-CM | POA: Diagnosis not present

## 2022-08-21 DIAGNOSIS — E1142 Type 2 diabetes mellitus with diabetic polyneuropathy: Secondary | ICD-10-CM | POA: Diagnosis not present

## 2022-08-21 DIAGNOSIS — R2689 Other abnormalities of gait and mobility: Secondary | ICD-10-CM | POA: Diagnosis not present

## 2022-08-22 DIAGNOSIS — K080 Exfoliation of teeth due to systemic causes: Secondary | ICD-10-CM | POA: Diagnosis not present

## 2022-08-25 DIAGNOSIS — G4733 Obstructive sleep apnea (adult) (pediatric): Secondary | ICD-10-CM | POA: Diagnosis not present

## 2022-08-26 ENCOUNTER — Other Ambulatory Visit (HOSPITAL_BASED_OUTPATIENT_CLINIC_OR_DEPARTMENT_OTHER): Payer: Self-pay

## 2022-08-26 ENCOUNTER — Telehealth: Payer: Self-pay | Admitting: Pharmacy Technician

## 2022-08-26 ENCOUNTER — Other Ambulatory Visit: Payer: Self-pay

## 2022-08-26 ENCOUNTER — Encounter: Payer: Self-pay | Admitting: Family

## 2022-08-26 DIAGNOSIS — E1142 Type 2 diabetes mellitus with diabetic polyneuropathy: Secondary | ICD-10-CM | POA: Diagnosis not present

## 2022-08-26 DIAGNOSIS — Z5986 Financial insecurity: Secondary | ICD-10-CM

## 2022-08-26 DIAGNOSIS — K080 Exfoliation of teeth due to systemic causes: Secondary | ICD-10-CM | POA: Diagnosis not present

## 2022-08-26 DIAGNOSIS — R2689 Other abnormalities of gait and mobility: Secondary | ICD-10-CM | POA: Diagnosis not present

## 2022-08-26 MED ORDER — AMOXICILLIN 500 MG PO CAPS
2000.0000 mg | ORAL_CAPSULE | Freq: Every day | ORAL | 0 refills | Status: DC
  Filled 2022-08-26: qty 8, 2d supply, fill #0

## 2022-08-26 NOTE — Progress Notes (Signed)
[facility])                                            [facility] Quality Pharmacy Team    08/26/2022  RONEY YOUTZ [birth year] [id number]  Care coordination call placed to [employer] in regard to [facility] application.  Spoke to [place] who informs patient is APPROVED 08/07/22-05/12/23. Medication will auto fill and ship to patient's home going forward until enrollment period ends. Patient may call [employer] at [phone] at any time if feels like current supply will not last until next shipment date.  Deziyah Arvin P. Peni Rupard, CPhT [facility] [employer]  [phone]

## 2022-08-28 ENCOUNTER — Telehealth: Payer: Self-pay | Admitting: Pharmacy Technician

## 2022-08-28 DIAGNOSIS — R2689 Other abnormalities of gait and mobility: Secondary | ICD-10-CM | POA: Diagnosis not present

## 2022-08-28 DIAGNOSIS — E1142 Type 2 diabetes mellitus with diabetic polyneuropathy: Secondary | ICD-10-CM | POA: Diagnosis not present

## 2022-08-28 DIAGNOSIS — Z5986 Financial insecurity: Secondary | ICD-10-CM

## 2022-08-28 NOTE — Progress Notes (Signed)
[facility])                                            [facility] Quality Pharmacy Team    08/28/2022  BRENNER VISCONTI 1955/08/31 [id number]  Received both patient and provider portion(s) of patient assistance application(s) for Toujeo and Ozempic. Faxed completed application and required documents into [employer] and [employer] respectively.    Duane Trias P. Larrie Lucia, CPhT [facility] [employer]  [phone]

## 2022-09-01 DIAGNOSIS — K080 Exfoliation of teeth due to systemic causes: Secondary | ICD-10-CM | POA: Diagnosis not present

## 2022-09-02 DIAGNOSIS — E1142 Type 2 diabetes mellitus with diabetic polyneuropathy: Secondary | ICD-10-CM | POA: Diagnosis not present

## 2022-09-02 DIAGNOSIS — R2689 Other abnormalities of gait and mobility: Secondary | ICD-10-CM | POA: Diagnosis not present

## 2022-09-03 DIAGNOSIS — K080 Exfoliation of teeth due to systemic causes: Secondary | ICD-10-CM | POA: Diagnosis not present

## 2022-09-09 ENCOUNTER — Other Ambulatory Visit: Payer: Self-pay

## 2022-09-09 MED ORDER — RANOLAZINE ER 500 MG PO TB12: 500.0000 mg | ORAL_TABLET | Freq: Two times a day (BID) | ORAL | 1 refills | Status: DC

## 2022-09-10 ENCOUNTER — Inpatient Hospital Stay: Payer: Medicare Other | Admitting: Hematology & Oncology

## 2022-09-10 ENCOUNTER — Inpatient Hospital Stay: Payer: Medicare Other | Attending: Hematology & Oncology

## 2022-09-10 ENCOUNTER — Other Ambulatory Visit (HOSPITAL_BASED_OUTPATIENT_CLINIC_OR_DEPARTMENT_OTHER): Payer: Self-pay

## 2022-09-10 ENCOUNTER — Other Ambulatory Visit: Payer: Self-pay

## 2022-09-10 ENCOUNTER — Inpatient Hospital Stay: Payer: Medicare Other

## 2022-09-10 ENCOUNTER — Telehealth: Payer: Self-pay | Admitting: Pharmacy Technician

## 2022-09-10 ENCOUNTER — Encounter: Payer: Self-pay | Admitting: Hematology & Oncology

## 2022-09-10 VITALS — BP 158/78 | HR 74 | Temp 99.0°F | Resp 17 | Wt 216.0 lb

## 2022-09-10 DIAGNOSIS — C649 Malignant neoplasm of unspecified kidney, except renal pelvis: Secondary | ICD-10-CM | POA: Diagnosis not present

## 2022-09-10 DIAGNOSIS — F419 Anxiety disorder, unspecified: Secondary | ICD-10-CM

## 2022-09-10 DIAGNOSIS — E1140 Type 2 diabetes mellitus with diabetic neuropathy, unspecified: Secondary | ICD-10-CM | POA: Diagnosis not present

## 2022-09-10 DIAGNOSIS — C651 Malignant neoplasm of right renal pelvis: Secondary | ICD-10-CM

## 2022-09-10 DIAGNOSIS — G629 Polyneuropathy, unspecified: Secondary | ICD-10-CM | POA: Diagnosis not present

## 2022-09-10 DIAGNOSIS — Z95828 Presence of other vascular implants and grafts: Secondary | ICD-10-CM

## 2022-09-10 DIAGNOSIS — Z5986 Financial insecurity: Secondary | ICD-10-CM

## 2022-09-10 DIAGNOSIS — Z794 Long term (current) use of insulin: Secondary | ICD-10-CM | POA: Insufficient documentation

## 2022-09-10 DIAGNOSIS — Z452 Encounter for adjustment and management of vascular access device: Secondary | ICD-10-CM | POA: Insufficient documentation

## 2022-09-10 DIAGNOSIS — Z9221 Personal history of antineoplastic chemotherapy: Secondary | ICD-10-CM | POA: Diagnosis not present

## 2022-09-10 DIAGNOSIS — Z79899 Other long term (current) drug therapy: Secondary | ICD-10-CM | POA: Insufficient documentation

## 2022-09-10 DIAGNOSIS — Z8551 Personal history of malignant neoplasm of bladder: Secondary | ICD-10-CM | POA: Diagnosis not present

## 2022-09-10 LAB — CMP (CANCER CENTER ONLY)
ALT: 34 U/L (ref 0–44)
AST: 26 U/L (ref 15–41)
Albumin: 4 g/dL (ref 3.5–5.0)
Alkaline Phosphatase: 46 U/L (ref 38–126)
Anion gap: 4 — ABNORMAL LOW (ref 5–15)
BUN: 23 mg/dL (ref 8–23)
CO2: 24 mmol/L (ref 22–32)
Calcium: 8.4 mg/dL — ABNORMAL LOW (ref 8.9–10.3)
Chloride: 105 mmol/L (ref 98–111)
Creatinine: 1.84 mg/dL — ABNORMAL HIGH (ref 0.61–1.24)
GFR, Estimated: 40 mL/min — ABNORMAL LOW (ref >60)
Glucose, Bld: 133 mg/dL — ABNORMAL HIGH (ref 70–99)
Potassium: 3.7 mmol/L (ref 3.5–5.1)
Sodium: 133 mmol/L — ABNORMAL LOW (ref 135–145)
Total Bilirubin: 0.6 mg/dL (ref 0.3–1.2)
Total Protein: 6.6 g/dL (ref 6.5–8.1)

## 2022-09-10 LAB — CBC WITH DIFFERENTIAL (CANCER CENTER ONLY)
Abs Immature Granulocytes: 0.03 10*3/uL (ref 0.00–0.07)
Basophils Absolute: 0 10*3/uL (ref 0.0–0.1)
Basophils Relative: 0 %
Eosinophils Absolute: 0 10*3/uL (ref 0.0–0.5)
Eosinophils Relative: 1 %
HCT: 41.3 % (ref 39.0–52.0)
Hemoglobin: 14.4 g/dL (ref 13.0–17.0)
Immature Granulocytes: 1 %
Lymphocytes Relative: 35 %
Lymphs Abs: 1.1 10*3/uL (ref 0.7–4.0)
MCH: 34.2 pg — ABNORMAL HIGH (ref 26.0–34.0)
MCHC: 34.9 g/dL (ref 30.0–36.0)
MCV: 98.1 fL (ref 80.0–100.0)
Monocytes Absolute: 0.4 10*3/uL (ref 0.1–1.0)
Monocytes Relative: 11 %
Neutro Abs: 1.7 10*3/uL (ref 1.7–7.7)
Neutrophils Relative %: 52 %
Platelet Count: 118 10*3/uL — ABNORMAL LOW (ref 150–400)
RBC: 4.21 MIL/uL — ABNORMAL LOW (ref 4.22–5.81)
RDW: 13.6 % (ref 11.5–15.5)
WBC Count: 3.2 10*3/uL — ABNORMAL LOW (ref 4.0–10.5)
nRBC: 0 % (ref 0.0–0.2)

## 2022-09-10 LAB — LACTATE DEHYDROGENASE: LDH: 163 U/L (ref 98–192)

## 2022-09-10 MED ORDER — SODIUM CHLORIDE 0.9% FLUSH
10.0000 mL | Freq: Once | INTRAVENOUS | Status: AC
  Administered 2022-09-10: 10 mL via INTRAVENOUS

## 2022-09-10 MED ORDER — ALPRAZOLAM 1 MG PO TABS
1.0000 mg | ORAL_TABLET | Freq: Three times a day (TID) | ORAL | 0 refills | Status: DC | PRN
Start: 2022-09-10 — End: 2024-01-20
  Filled 2022-09-10: qty 30, 10d supply, fill #0

## 2022-09-10 MED ORDER — HEPARIN SOD (PORK) LOCK FLUSH 100 UNIT/ML IV SOLN
500.0000 [IU] | Freq: Once | INTRAVENOUS | Status: AC
  Administered 2022-09-10: 500 [IU] via INTRAVENOUS

## 2022-09-10 NOTE — Patient Instructions (Signed)

## 2022-09-10 NOTE — Progress Notes (Signed)
Hematology and Oncology Follow Up Visit  Isaac [name] [id number] [birth year] 67 y.o. 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 is here today for follow-up.  We last saw him back in January.  Since then, he has been doing okay.  Unfortunate, he may have hurt his left ankle.  He said that he was try to put a by light bulb.  He was standing on his bed.  Apparently he slipped off.  He is going to have an MRI of the ankle.  Hopefully there is no damage.  He is doing quite well with his blood sugars.  I think he is on insulin.  His blood sugar today was 133.  He has had no problems with bowels or bladder.  He has had no cough or shortness of breath.  He has had no fever.  Thankfully, he is avoided COVID.  There is still some neuropathy from the diabetes.  He does wear diabetic shoes.  He has had no bleeding.  He has had no rashes.  He has little bit of swelling over on the left side which is chronic.  Overall, I would say that his performance status is probably ECOG 1.      Medications:  Allergies as of 09/10/2022       Reactions   Nsaids Other (See Comments)   Has 1 kidney left        Medication List        Accurate as of Sep 10, 2022 12:55 PM. If you have any questions, ask your nurse or doctor.          STOP taking these medications    tamsulosin 0.4 MG Caps capsule Commonly known as: FLOMAX Stopped by: Josph Macho, MD       TAKE these medications    acetaminophen 500 MG tablet Commonly known as: TYLENOL Take 500 mg by mouth every 6 (six) hours as needed for moderate pain or headache.   ALPRAZolam 1 MG tablet Commonly known as: XANAX Take 1 tablet (1 mg total) by mouth 3  (three) times daily as needed for anxiety.   amoxicillin 500 MG capsule Commonly known as: AMOXIL Take 4 capsules (2,000 mg total) by mouth once for 1 dose, the day of dentist appointment.   aspirin EC 81 MG tablet Take 81 mg by mouth daily.   Farxiga 10 MG Tabs tablet Generic drug: dapagliflozin propanediol Take 1 tablet (10 mg total) by mouth daily.   glimepiride 2 MG tablet Commonly known as: AMARYL TAKE 1 BY MOUTH DAILY BEFORE SUPPER What changed:  how much to take how to take this when to take this additional instructions   insulin glargine (1 Unit Dial) 300 UNIT/ML Solostar Pen Commonly known as: TOUJEO Inject 40 Units into the skin 2 (two) times daily.   lidocaine-prilocaine cream Commonly known as: EMLA Apply topically once. Apply a dime size to port-a-cath 1-2 hours prior to access. Cover with [employer].   metoprolol succinate 25 MG 24 hr tablet Commonly known as: TOPROL-XL TAKE 1 TABLET BY MOUTH AT BEDTIME. GENERIC EQUIVALENT FOR TOPROL-XL   multivitamin with minerals tablet Take 1 tablet by mouth daily.   nitroGLYCERIN 0.4 MG SL tablet Commonly  known as: NITROSTAT Place 1 tablet (0.4 mg total) under the tongue every 5 (five) minutes as needed for chest pain. Repeat every 5 minute up to 3 doses if no relief call 911.   ondansetron 4 MG tablet Commonly known as: ZOFRAN Take 1 tablet (4 mg total) by mouth every 8 (eight) hours as needed.   oxyCODONE 5 MG immediate release tablet Commonly known as: Oxy IR/ROXICODONE Take 1 tablet (5 mg total) by mouth every 6 (six) to 8 (eight) hours as needed for pain.   Ozempic (0.25 or 0.5 MG/DOSE) 2 MG/3ML Sopn Generic drug: Semaglutide(0.25 or 0.5MG /DOS) Inject 0.25 mg into the skin once a week.   pantoprazole 40 MG tablet Commonly known as: PROTONIX Take 1 tablet (40 mg total) by mouth every morning.   PRESCRIPTION MEDICATION Inhale into the lungs at bedtime. CPAP   PROBIOTIC PO Take 1 tablet by mouth  daily.   ranolazine 500 MG 12 hr tablet Commonly known as: Ranexa Take 1 tablet (500 mg total) by mouth 2 (two) times daily.   rOPINIRole 2 MG tablet Commonly known as: REQUIP TAKE 1 TABLET BY MOUTH AT BEDTIME   rosuvastatin 10 MG tablet Commonly known as: CRESTOR Take 1 tablet (10 mg total) by mouth every other day.   tadalafil 5 MG tablet Commonly known as: CIALIS Take 1 tablet (5 mg total) by mouth daily.   tadalafil 20 MG tablet Commonly known as: CIALIS Take 1 tablet (20 mg total) by mouth daily as needed.        Allergies:  Allergies  Allergen Reactions   Nsaids Other (See Comments)    Has 1 kidney left    Past Medical History, Surgical history, Social history, and Family History were reviewed and updated.  Review of Systems: Review of Systems  Constitutional: Negative.   HENT: Negative.    Eyes: Negative.   Respiratory: Negative.    Cardiovascular: Negative.   Gastrointestinal: Negative.   Genitourinary: Negative.   Musculoskeletal:  Positive for back pain.  Skin: Negative.   Neurological:  Positive for headaches.  Endo/Heme/Allergies: Negative.   Psychiatric/Behavioral: Negative.       Physical Exam:  height is 5\' 9"  (1.753 m) and weight is 216 lb 1.3 oz (98 kg). His oral temperature is 99 F (37.2 C). His blood pressure is 158/78 (abnormal) and his pulse is 74. His respiration is 18 and oxygen saturation is 98%.   Wt Readings from Last 3 Encounters:  09/10/22 216 lb 1.3 oz (98 kg)  09/10/22 216 lb (98 kg)  05/16/22 215 lb (97.5 kg)    Physical Exam Vitals reviewed.  HENT:     Head: Normocephalic and atraumatic.  Eyes:     Pupils: Pupils are equal, round, and reactive to light.  Cardiovascular:     Rate and Rhythm: Normal rate and regular rhythm.     Heart sounds: Normal heart sounds.  Pulmonary:     Effort: Pulmonary effort is normal.     Breath sounds: Normal breath sounds.  Abdominal:     General: Bowel sounds are normal.      Palpations: Abdomen is soft.  Musculoskeletal:        General: No tenderness or deformity. Normal range of motion.     Cervical back: Normal range of motion.     Comments: He has a brace on his left ankle.  There may be a little bit of swelling in the left lower leg.  Lymphadenopathy:     Cervical: No  cervical adenopathy.  Skin:    General: Skin is warm and dry.     Findings: No erythema or rash.  Neurological:     Mental Status: He is alert and oriented to person, place, and time.  Psychiatric:        Behavior: Behavior normal.        Thought Content: Thought content normal.        Judgment: Judgment normal.     Lab Results  Component Value Date   WBC 3.2 (L) 09/10/2022   HGB 14.4 09/10/2022   HCT 41.3 09/10/2022   MCV 98.1 09/10/2022   PLT 118 (L) 09/10/2022   Lab Results  Component Value Date   FERRITIN 230 06/04/2021   IRON 129 06/04/2021   TIBC 329 06/04/2021   UIBC 200 06/04/2021   IRONPCTSAT 39 06/04/2021   Lab Results  Component Value Date   RETICCTPCT 2.0 06/04/2021   RBC 4.21 (L) 09/10/2022   No results found for: "KPAFRELGTCHN", "LAMBDASER", "KAPLAMBRATIO" Lab Results  Component Value Date   IGA 232 01/25/2015   No results found for: "TOTALPROTELP", "ALBUMINELP", "A1GS", "A2GS", "BETS", "BETA2SER", "GAMS", "MSPIKE", "SPEI"   Chemistry      Component Value Date/Time   NA 133 (L) 09/10/2022 1140   NA 139 09/03/2021 1456   NA 134 05/04/2017 0925   K 3.7 09/10/2022 1140   K 4.5 05/04/2017 0925   CL 105 09/10/2022 1140   CL 97 (L) 05/04/2017 0925   CO2 24 09/10/2022 1140   CO2 23 05/04/2017 0925   BUN 23 09/10/2022 1140   BUN 21 09/03/2021 1456   BUN 28 (H) 05/04/2017 0925   CREATININE 1.84 (H) 09/10/2022 1140   CREATININE 1.95 (H) 11/21/2021 1041   GLU 86 03/31/2016 0000      Component Value Date/Time   CALCIUM 8.4 (L) 09/10/2022 1140   CALCIUM 9.3 05/04/2017 0925   ALKPHOS 46 09/10/2022 1140   ALKPHOS 58 05/04/2017 0925   AST 26 09/10/2022  1140   ALT 34 09/10/2022 1140   ALT 90 (H) 05/04/2017 0925   BILITOT 0.6 09/10/2022 1140       Impression and Plan: [name] is a very pleasant 67 yo caucasian gentleman with infiltrating high grade papillary urothelial carcinoma with right nephrectomy in October 2018 and adjuvant chemotherapy.   So far, everything is done incredibly well with respect to the urothelial carcinoma.  Is now been 5 and half years since he had treatment.  We will still follow him up.  We see him back every 3 to 4 months.  I think we will probably move him to every 4 months now.  We will flush his Port-A-Cath every 2 months.  I do not see that we have to do any scans on him at the present time.  Josph Macho, MD 5/1/202412:55 PM

## 2022-09-10 NOTE — Progress Notes (Signed)
[facility])                                            [facility] Quality Pharmacy Team    09/10/2022  Isaac [name] Jun 18, 1955 [id number]  Two care coordination calls placed today. First one to [employer] in regard to [employer] and Second one to [employer] in regard to [employer] application.  Spoke to [place] at [employer] who informs patient is APPROVED 08/29/22-05/12/23 for [employer]. The patient's id number with [employer] is [id number]. She informs medication will be processed and delivered to the prescriber's office. Subsequent refills will be processed and shipped automatically by [employer] to prescriber's office. If patient feels as though supply is not adequate until next refill, then patient can call [employer] at [phone].  Spoke to [place] at [employer] who informs patient is APPROVED 09/05/22-05/12/23 for [employer]. She informs medication will be processed and delivered to the prescriber's office. Subsequent refills will be processed and shipped automatically by [employer] to prescriber's office. If patient feels as though supply is not adequate until next refill, then patient can call [employer] at [phone].  Mikya Don P. Shirl Ludington, CPhT [facility] [employer]  [phone]

## 2022-09-15 ENCOUNTER — Other Ambulatory Visit: Payer: Self-pay

## 2022-09-15 MED ORDER — PANTOPRAZOLE SODIUM 40 MG PO TBEC: 40.0000 mg | DELAYED_RELEASE_TABLET | Freq: Every morning | ORAL | 1 refills | Status: DC

## 2022-09-15 MED ORDER — METOPROLOL SUCCINATE ER 25 MG PO TB24: ORAL_TABLET | ORAL | 1 refills | Status: DC

## 2022-09-18 ENCOUNTER — Telehealth: Payer: Self-pay | Admitting: Internal Medicine

## 2022-09-18 DIAGNOSIS — I200 Unstable angina: Secondary | ICD-10-CM

## 2022-09-18 NOTE — Telephone Encounter (Addendum)
I spoke with the pt and he reports the same as he did for the triage nurse.. he had syncope and nausea and diaphoresis with his pain in the center if his chest... it was finally relieved after the 3rd nitro.   He has felt much better since but today he is very tired.   I have talked with Dr Tenny Craw and since unable to make him an appt for about a week.. I went over Cardiac CT instructions with him and he promises that if he has another episode or any symptoms at all he will call EMS and not wait until his CT.. I made him a follow up appt with Dr Tenny Craw for 10/10/22 but he will not wait until that appt if he has any further symptoms. I offered him a 09/26/22 appt with Dr Tenny Craw but he declined and was hoping he could get the CT results in before his appt.

## 2022-09-18 NOTE — Telephone Encounter (Signed)
Pt c/o of Chest Pain: STAT if CP now or developed within 24 hours  1. Are you having CP right now? No  2. Are you experiencing any other symptoms (ex. SOB, nausea, vomiting, sweating)? Dizzy, felt like he was going to pass out, nausea, cold body sweats  3. How long have you been experiencing CP? Started yesterday  4. Is your CP continuous or coming and going? Coming and going  5. Have you taken Nitroglycerin? Yes     ?

## 2022-09-18 NOTE — Telephone Encounter (Signed)
Pt called stating he had chest pain yesterday morning around 0630 while he was drinking water,  he became "dizzy and broke out in body sweats". Pt stated he did take one nitro the pain did subside, then pain returned more than 5 min later he took another nitroglycerin, felt nauseated, however he did not vomit. Later on in the morning he took a 3rd nitroglycerin  and the pain eventually subsided. Pt stated he took his blood pressure while have chest pain 141/87, he rechecked his blood pressure after taking the 2nd nitro 112/85 HR 83, and his blood sugar 165. As of this morning, pt stated he is asymptomatic, bp 168/92 HR 63, he did not take any of his morning medications. Pt did not seek medical attention yesterday and he did not contact PCP. Explained ED precautions, pt voiced understanding. Will forward to MD and nurse for advise.

## 2022-09-22 ENCOUNTER — Encounter (HOSPITAL_COMMUNITY): Payer: Self-pay

## 2022-09-23 ENCOUNTER — Other Ambulatory Visit (HOSPITAL_BASED_OUTPATIENT_CLINIC_OR_DEPARTMENT_OTHER): Payer: Self-pay

## 2022-09-23 MED ORDER — METOPROLOL TARTRATE 100 MG PO TABS: ORAL_TABLET | ORAL | 0 refills | Status: DC

## 2022-09-23 MED ORDER — METOPROLOL TARTRATE 100 MG PO TABS
ORAL_TABLET | ORAL | 0 refills | Status: DC
  Filled 2022-09-23 – 2022-09-24 (×2): qty 1, 1d supply, fill #0

## 2022-09-23 NOTE — Addendum Note (Signed)
Addended by: Bertram Millard on: 09/23/2022 02:04 PM   Modules accepted: Orders

## 2022-09-24 ENCOUNTER — Other Ambulatory Visit (HOSPITAL_BASED_OUTPATIENT_CLINIC_OR_DEPARTMENT_OTHER): Payer: Self-pay

## 2022-09-24 DIAGNOSIS — G4733 Obstructive sleep apnea (adult) (pediatric): Secondary | ICD-10-CM | POA: Diagnosis not present

## 2022-09-26 ENCOUNTER — Telehealth (HOSPITAL_COMMUNITY): Payer: Self-pay | Admitting: Emergency Medicine

## 2022-09-26 NOTE — Telephone Encounter (Signed)
Ach[facility]reach Reaching out to patient to offer assistance regarding upcoming cardiac imaging study; pt verbalizes understanding of appt date/time, parking situation and where to check in, pre-test NPO status and medications ordered, and verified current allergies; name and call back number provided for further questions should they arise Rockwell Alexandria RN Navigator Cardiac Imaging [facility] [phone] office [phone] cell

## 2022-09-26 NOTE — Telephone Encounter (Signed)
Attempted to call patient regarding upcoming cardiac CT appointment. °Left message on voicemail with name and callback number °Lariah Fleer RN Navigator Cardiac Imaging °[facility] °[phone] Office °[phone] Cell ° °

## 2022-09-26 NOTE — Telephone Encounter (Signed)
left

## 2022-09-29 ENCOUNTER — Ambulatory Visit (HOSPITAL_COMMUNITY)
Admission: RE | Admit: 2022-09-29 | Discharge: 2022-09-29 | Disposition: A | Discharge status: Home / Self Care | Payer: Medicare Other | Source: Ambulatory Visit | Attending: Internal Medicine | Admitting: Internal Medicine

## 2022-09-29 DIAGNOSIS — I200 Unstable angina: Secondary | ICD-10-CM | POA: Diagnosis not present

## 2022-09-29 MED ORDER — IOHEXOL 350 MG/ML SOLN
100.0000 mL | Freq: Once | INTRAVENOUS | Status: AC | PRN
  Administered 2022-09-29: 100 mL via INTRAVENOUS

## 2022-09-29 MED ORDER — NITROGLYCERIN 0.4 MG SL SUBL
SUBLINGUAL_TABLET | SUBLINGUAL | Status: AC
  Filled 2022-09-29: qty 2

## 2022-09-29 MED ORDER — NITROGLYCERIN 0.4 MG SL SUBL
0.8000 mg | SUBLINGUAL_TABLET | Freq: Once | SUBLINGUAL | Status: AC
  Administered 2022-09-29: 0.8 mg via SUBLINGUAL

## 2022-09-29 NOTE — Progress Notes (Signed)
CT scan completed. Tolerated well. D/C home ambulatory, awake and alert. In no distress. 

## 2022-10-08 NOTE — Progress Notes (Signed)
Cardiology Office Note   Date:  10/10/2022   ID:  Isaac [name], DOB 1955/09/23, MRN [id number]  PCP:  Margaree Mackintosh, MD  Cardiologist:   Dietrich Pates, MD   F/U of chest pain and CAD     History of Present Illness: Isaac [name] is a 67 y.o. male with a hx of mild CAD and probable microvascular dz( TIMI II flow in LAD and LCx )  He also has a hx of DM, HL, HTN, bladder CAD   Last cath in 2009  Minimal CAD   Myovue in 2014 Low risk I saw the pt in clinic in 2022   Since then he was seen by D Dunn in APril 2023    The pt called in earlier this month complaining of  CP  He recounts that he woke up at 6:30 one morning   Went to BR then went to kitchen  Sat down  to eat.  Then developed CP    Took NTG x 2  Went back to kitchen to get water  IT was there that he got dizzy, nauseated, sweaty   Says he almost passed out  Sat down  CP was gone  Symptoms eased and he felt better  Went on to take shower, eat and get on with day     CCTA done   Calcium score 216 (63% for age, sex)   Nonobstructive CAD  Since thie recent epiosde of CP he has had no further spells   Breathing is OK   He denies dizziness No sweats    Current Meds  Medication Sig   acetaminophen (TYLENOL) 500 MG tablet Take 500 mg by mouth every 6 (six) hours as needed for moderate pain or headache.   ALPRAZolam (XANAX) 1 MG tablet Take 1 tablet (1 mg total) by mouth 3 (three) times daily as needed for anxiety.   amoxicillin (AMOXIL) 500 MG capsule Take 4 capsules (2,000 mg total) by mouth once for 1 dose, the day of dentist appointment.   aspirin EC 81 MG tablet Take 81 mg by mouth daily.   dapagliflozin propanediol (FARXIGA) 10 MG TABS tablet Take 1 tablet (10 mg total) by mouth daily.   glimepiride (AMARYL) 2 MG tablet TAKE 1 BY MOUTH DAILY BEFORE SUPPER (Patient taking differently: Take 2 mg by mouth 2 (two) times daily.)   insulin glargine, 1 Unit Dial, (TOUJEO) 300 UNIT/ML Solostar Pen Inject 40 Units into the skin 2  (two) times daily.    lidocaine-prilocaine (EMLA) cream Apply topically once. Apply a dime size to port-a-cath 1-2 hours prior to access. Cover with [employer].   metoprolol succinate (TOPROL-XL) 25 MG 24 hr tablet TAKE 1 TABLET BY MOUTH AT BEDTIME. GENERIC EQUIVALENT FOR TOPROL-XL   metoprolol tartrate (LOPRESSOR) 100 MG tablet Take one tablet (100 mg) by mouth 2 hours prior to your cardiac CT.   Multiple Vitamins-Minerals (MULTIVITAMIN WITH MINERALS) tablet Take 1 tablet by mouth daily.   nitroGLYCERIN (NITROSTAT) 0.4 MG SL tablet Place 1 tablet (0.4 mg total) under the tongue every 5 (five) minutes as needed for chest pain. Repeat every 5 minute up to 3 doses if no relief call 911.   ondansetron (ZOFRAN) 4 MG tablet Take 1 tablet (4 mg total) by mouth every 8 (eight) hours as needed.   oxyCODONE (OXY IR/ROXICODONE) 5 MG immediate release tablet Take 1 tablet (5 mg total) by mouth every 6 (six) to 8 (eight) hours as needed for pain.  pantoprazole (PROTONIX) 40 MG tablet Take 1 tablet (40 mg total) by mouth every morning.   PRESCRIPTION MEDICATION Inhale into the lungs at bedtime. CPAP   Probiotic Product (PROBIOTIC PO) Take 1 tablet by mouth daily.   ranolazine (RANEXA) 500 MG 12 hr tablet Take 1 tablet (500 mg total) by mouth 2 (two) times daily.   rOPINIRole (REQUIP) 2 MG tablet TAKE 1 TABLET BY MOUTH AT BEDTIME   rosuvastatin (CRESTOR) 10 MG tablet Take 1 tablet (10 mg total) by mouth every other day.   Semaglutide,0.25 or 0.5MG /DOS, (OZEMPIC, 0.25 OR 0.5 MG/DOSE,) 2 MG/3ML SOPN Inject 0.25 mg into the skin once a week.   tadalafil (CIALIS) 20 MG tablet Take 1 tablet (20 mg total) by mouth daily as needed.   tadalafil (CIALIS) 5 MG tablet Take 1 tablet (5 mg total) by mouth daily.     Allergies:   Nsaids   Past Medical History:  Diagnosis Date   Arthritis    Bladder cancer [facility]) urologist-- dr Marlou Porch   11/ 2019 recurrent    Cancer of renal pelvis, right [facility])    oncologist-  dr  Myna Hidalgo--  high grade papillary urothelial carcinoma, Stage III (T3 Nx M0) -- 02-12-2017  s/p  left nephrectomy--- completed chemotherapy 07-27-2017   Cataract    Chemotherapy-induced neuropathy (HCC)    feet   CKD (chronic kidney disease) stage 3, GFR 30-59 ml/min (HCC)    Coronary artery disease    Coronary vasospasm (HCC) PER DR Senora Lacson NOTE 03-22-2012--  INTER. 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/ reimplantation   Hyperlipidemia    Hypertension    Left renal artery stenosis (HCC)    Myocardial infarction (HCC) 2005   Nocturia more than twice per night    OSA on CPAP    Pneumonia    Renal artery stenosis (HCC)    Sleep apnea    Thrombocytopenia (HCC)    Type 2 diabetes mellitus treated with insulin [facility])    endocrinologist-- dr Morrison Old ([facility] in [place])  lov note in epic    Urgency of urination    Wears glasses    Wisdom teeth extracted     Past Surgical History:  Procedure Laterality Date   APPENDECTOMY  08/03/2004   CALCANEAL OSTEOTOMY Left 10/01/2021   Procedure: LEFT FOOT MEDIAL DISPLACEMENT CALCANEAL OSTEOTOMY;  Surgeon: [name], MD;  Location: [facility] OR;  Service: Orthopedics;  Laterality: Left;  LENGTH OF SURGERY: 120 MINUTES   CARDIAC CATHETERIZATION  x3  last one 06-17-2007   MILD NON-OBSTRUCTIVE CAD/ NORMAL LVF/ 30% LEFT RENAL ARTERY STENOSIS   CARDIOVASCULAR STRESS TEST  02/23/2013   Low risk nuclear study w/ small inferior wall infarct at mid and basal level with no  ischemia/  normal LV function and wall motion , ef 57%   COLON SURGERY     CYSTO/ BILATERAL RETROGRADE PYELOGRAM/ RIGHT URETEROSCOPIC LASER FULGURATION URETERAL TUMOR  02/16/2008  CYSTO/ BLADDER BX  09-13-2008;  08-13-2007; 04-19-2007; 06-01-2006   CYSTO/ CYSTOGRAM/ URETEROSCOPY  02/21/2009   CYSTO/ RESECTION BLADDER TUMOR/ LEFT RETROGRADE PYELOGRAM/ RIGHT URETEROSCOPY  08/07/2010   TCC OF BLADDER   S/P DISTAL URETERECTOMY/ REIMPLANTATION   CYSTO/ RIGHT URETEROSCOPY/ BX URETERAL TUMOR  10/11/2008   CYSTOSCOPY W/ RETROGRADES  04/19/2012   Procedure: CYSTOSCOPY WITH RETROGRADE PYELOGRAM;  Surgeon: Valetta Fuller, MD;  Location: [facility];  Service: Urology;  Laterality: Bilateral;  30 MINS Cysto, Biopsy, possible TURBT, Bilateral retrograde pyelograms with Mitomycin C instilation Post op    CYSTOSCOPY W/ RETROGRADES Left 03/25/2018   Procedure: CYSTOSCOPY WITH RETROGRADE PYELOGRAM;  Surgeon: Crist Fat, MD;  Location[facility];  Service: Urology;  Laterality: Left;   CYSTOSCOPY WITH BIOPSY  04/07/2011   Procedure: CYSTOSCOPY WITH BIOPSY/ RIGHT URETEROSCOPY;  Surgeon: Valetta Fuller, MD;  [facility];  Service: Urology;  Laterality: N/A;    cystoscopy, cystogram, biopsy and fulgeration   CYSTOSCOPY WITH BIOPSY  12/29/2011   Procedure: CYSTOSCOPY WITH BIOPSY;  Surgeon: Valetta Fuller, MD;  Location: [facility];  Service: Urology;  Laterality: N/A;  30 MIN  WITH FULGERATION     CYSTOSCOPY WITH RETROGRADE PYELOGRAM, URETEROSCOPY AND STENT PLACEMENT Left 04/28/2018   Procedure: LEFT RETROGRADE PYELOGRAM, DIAGNOSTIC LEFT URETEROSCOPY;  Surgeon: Crist Fat, MD;  Location: [facility];  Service: Urology;  Laterality: Left;   CYSTOSCOPY WITH STENT PLACEMENT N/A 03/09/2020   Procedure: CYSTOSCOPY WITH FIREFLYER  PLACEMENT;  Surgeon: Crist Fat, MD;  Location: [facility];  Service: Urology;  Laterality: N/A;    ELBOW SURGERY  07/07/2003   RIGHT ELBOW ARTHROSCOPY W/ OPEN RECONSTRUCTION   FLEXOR TENDON REPAIR Left 10/01/2021   Procedure: POSTERIOR TIBIAL TENDON RECONSTRUCTION;  Surgeon: [name], MD;  Location: [facility] OR;  Service: Orthopedics;  Laterality: Left;   IR FLUORO GUIDE PORT INSERTION RIGHT  04/01/2017   IR US GUIDE VASC ACCESS RIGHT  04/01/2017   JOINT REPLACEMENT     bilateral knees   KNEE ARTHROSCOPY W/ DEBRIDEMENT  02/08/2004   RIGHT KNEE   OSTOMY N/A 03/09/2020   Procedure: POSSIBLE OSTOMY;  Surgeon: Karie Soda, MD;  Location: [facility];  Service: General;  Laterality: N/A;   PANENDOSCOPY N/A 03/09/2020   Procedure: PANENDOSCOPY RIGID;  Surgeon: Karie Soda, MD;  Location: [facility];  Service: General;  Laterality: N/A;   REPAIR EXTENSOR TENDON Left 10/01/2021   Procedure: CALCANEONAVICULAR LIGAMENT RECONSTRUCTION;  Surgeon: [name], MD;  Location[facility] OR;  Service: Orthopedics;  Laterality: Left;   RHINOPLASTY W/ MODIFIED CARTILAGE GRAFT  05/17/2007   INTERNAL NASAL VALVE COLLAPSE/ OSA/ SEPTAL PERFERATION   RIGHT DISTAL URETERECTOMY W/ REIMPLANTATION  10/30/2008   TCC OF RIGHT DISTAL URETER   ROBOT ASSITED LAPAROSCOPIC NEPHROURETERECTOMY Right 02/12/2017   Procedure: XI ROBOT ASSITED LAPAROSCOPIC NEPHROURETERECTOMY;  Surgeon: Crist Fat, MD;  Location: [facility];  Service: Urology;  Laterality: Right;   SHOULDER SURGERY  2005   RIGHT ROTATOR CUFF REPAIR   TOTAL KNEE ARTHROPLASTY Left 05/18/2015   TOTAL KNEE ARTHROPLASTY Left 05/18/2015   Procedure: TOTAL KNEE ARTHROPLASTY;  Surgeon: Frederico Hamman, MD;  Location: [facility] OR;  Service: Orthopedics;  Laterality: Left;   TOTAL KNEE ARTHROPLASTY Right 03/21/2016   Procedure: TOTAL KNEE ARTHROPLASTY;  Surgeon: Frederico Hamman, MD;  Location: [facility] OR;  Service: Orthopedics;  Laterality: Right;   TRANSTHORACIC ECHOCARDIOGRAM  02/03/2017   ef 60-65%, grade 1 diastolic  dysfunction/  trivial PR   TRANSURETHRAL RESECTION OF  BLADDER TUMOR  01/13/2007   TRANSURETHRAL RESECTION OF BLADDER TUMOR  04/19/2012   Procedure: TRANSURETHRAL RESECTION OF BLADDER TUMOR (TURBT);  Surgeon: Valetta Fuller, MD;  Location[facility];  Service: Urology;  Laterality: N/A;   TRANSURETHRAL RESECTION OF BLADDER TUMOR N/A 03/25/2018   Procedure: TRANSURETHRAL RESECTION OF BLADDER TUMOR (TURBT);  Surgeon: Crist Fat, MD;  Location: [facility];  Service: Urology;  Laterality: N/A;   TRANSURETHRAL RESECTION OF BLADDER TUMOR N/A 04/28/2018   Procedure: TRANSURETHRAL RESECTION OF BLADDER TUMOR (TURBT);  Surgeon: Crist Fat, MD;  Location: [facility];  Service: Urology;  Laterality: N/A;   umbillical hernia repair  2004     Social History:  The patient  reports that he quit smoking about 15 years ago. His smoking use included cigarettes. He has never used smokeless tobacco. He reports that he does not currently use alcohol after a past usage of about 4.0 - 5.0 standard drinks of alcohol per week. He reports that he does not use drugs.   Family History:  The patient's family history includes Coronary artery disease in his father; Diabetes in his maternal grandmother; Heart disease in his paternal grandfather.    ROS:  Please see the history of present illness. All other systems are reviewed and  Negative to the above problem except as noted.    PHYSICAL EXAM: VS:  BP (!) 144/80   Pulse 77   Ht 5\' 9"  (1.753 m)   Wt 218 lb 3.2 oz (99 kg)   SpO2 98%   BMI 32.22 kg/m   GEN: Obese 67 yo , in no acute distress  HEENT: normal  Neck: no JVD, no bruit  Cardiac: RRR; no murmur  No LE edema  Respiratory:  clear to auscultation GI: soft, nontender   No hepatomegaly   EKG:  EKG is ordered today.  SR 77 bpm      CT coronary angiogram   May 2024  MPRESSION: 1. Coronary artery calcium score 216 Agatston units. This places the patient in the 63rd percentile for age and gender,  suggesting intermediate risk for future cardiac events.   2.  Nonobstructive CAD.     Lipid Panel    Component Value Date/Time   CHOL 160 04/09/2021 1147   CHOL 110 06/19/2020 1641   TRIG 186 (H) 04/09/2021 1147   HDL 34 (L) 04/09/2021 1147   HDL 25 (L) 06/19/2020 1641   CHOLHDL 4.7 04/09/2021 1147   VLDL 34 (H) 10/10/2016 1043   LDLCALC 98 04/09/2021 1147   LDLDIRECT 96.0 07/20/2015 1035      Wt Readings from Last 3 Encounters:  10/10/22 218 lb 3.2 oz (99 kg)  09/10/22 216 lb 1.3 oz (98 kg)  09/10/22 216 lb (98 kg)      ASSESSMENT AND PLAN: 1  CP  Pt has a long hx of CP felt to be due to microvasc dz, spasm   Continues medical Rx for this    On Ranexa. Recent episode  CCTA with mild nonobstructive CAD Will add amlodipine low dose 2.5 mg to regimen   He was on higher dose years ago    Follow   2  CAD  Mild by recent  CTA    Remote cath he had TIMI II flow in LAD and LCx   Symptoms have been felt to represent microvascluar dz    3  HL   Recheck  lipids today     4  HTN  BP is a little elevated  Will add amlodipine   Follow     Pt denies anginal symptoms    WOuld continue current regimen     5  DM  ON several meds   Will check A1C today    6  OSA  He is using cPAP  Prescribed by K Clance years ago   Says the mask stays on    He has not had follow up  Says he is tired all the time    Will get review with T Turner  Current medicines are reviewed at length with the patient today.  The patient does not have concerns regarding medicines.  Signed, Dietrich Pates, MD  10/10/2022 3:58 PM    [facility] [address] [place], [place], [place]  [address] Phone: [phone]; Fax: [phone]

## 2022-10-10 ENCOUNTER — Ambulatory Visit: Payer: Medicare Other | Attending: Internal Medicine | Admitting: Internal Medicine

## 2022-10-10 ENCOUNTER — Encounter: Payer: Self-pay | Admitting: Internal Medicine

## 2022-10-10 VITALS — BP 144/80 | HR 77 | Ht 69.0 in | Wt 218.2 lb

## 2022-10-10 DIAGNOSIS — R079 Chest pain, unspecified: Secondary | ICD-10-CM

## 2022-10-10 DIAGNOSIS — I2510 Atherosclerotic heart disease of native coronary artery without angina pectoris: Secondary | ICD-10-CM

## 2022-10-10 DIAGNOSIS — E785 Hyperlipidemia, unspecified: Secondary | ICD-10-CM

## 2022-10-10 DIAGNOSIS — I10 Essential (primary) hypertension: Secondary | ICD-10-CM | POA: Diagnosis not present

## 2022-10-10 DIAGNOSIS — I701 Atherosclerosis of renal artery: Secondary | ICD-10-CM

## 2022-10-10 MED ORDER — AMLODIPINE BESYLATE 2.5 MG PO TABS: 2.5000 mg | ORAL_TABLET | Freq: Every day | ORAL | 3 refills | Status: DC

## 2022-10-10 NOTE — Patient Instructions (Addendum)
Medication Instructions: AMLODIPINE 2.5 MG DAILY   *If you need a refill on your cardiac medications before your next appointment, please call your pharmacy*   Lab Work: LIPID, HGBA1C, TSH  If you have labs (blood work) drawn today and your tests are completely normal, you will receive your results only by: MyChart Message (if you have MyChart) OR A paper copy in the mail If you have any lab test that is abnormal or we need to change your treatment, we will call you to review the results.   Testing/Procedures:    Follow-Up: At [facility], you and your health needs are our priority.  As part of our continuing mission to provide you with exceptional heart care, we have created designated Provider Care Teams.  These Care Teams include your primary Cardiologist (physician) and Advanced Practice Providers (APPs -  Physician Assistants and Nurse Practitioners) who all work together to provide you with the care you need, when you need it.  We recommend signing up for the patient portal called "MyChart".  Sign up information is provided on this After Visit Summary.  MyChart is used to connect with patients for Virtual Visits (Telemedicine).  Patients are able to view lab/test results, encounter notes, upcoming appointments, etc.  Non-urgent messages can be sent to your provider as well.   To learn more about what you can do with MyChart, go to [email or web address].    Your next appointment:  NURSE VISIT IN 6 WEEKS FOR BP CHECK   FOLLOW UP FEBRUARY WITH DR Tenny Craw

## 2022-10-11 LAB — LIPID PANEL
Chol/HDL Ratio: 3.9 ratio (ref 0.0–5.0)
Cholesterol, Total: 138 mg/dL (ref 100–199)
HDL: 35 mg/dL — ABNORMAL LOW (ref >39)
LDL Chol Calc (NIH): 66 mg/dL (ref 0–99)
Triglycerides: 225 mg/dL — ABNORMAL HIGH (ref 0–149)
VLDL Cholesterol Cal: 37 mg/dL (ref 5–40)

## 2022-10-11 LAB — TSH: TSH: 1.92 u[IU]/mL (ref 0.450–4.500)

## 2022-10-11 LAB — HEMOGLOBIN A1C
Est. average glucose Bld gHb Est-mCnc: 160 mg/dL
Hgb A1c MFr Bld: 7.2 % — ABNORMAL HIGH (ref 4.8–5.6)

## 2022-10-13 ENCOUNTER — Telehealth: Payer: Self-pay

## 2022-10-13 NOTE — Telephone Encounter (Signed)
-----   Message from Pricilla Riffle, MD sent at 10/10/2022  8:21 PM EDT ----- PT needs reeval/follow up for CPAP   Has no one now

## 2022-10-13 NOTE — Telephone Encounter (Signed)
Per Dr Tenny Craw, pt needs his CPAP maintained and possibly calibrated... pt was seeing a Pulmonary MD but has since stop practicing.   Per her note:   OSA He is using cPAP Prescribed by K Clance years ago Says the mask stays on He has not had follow up Says he is tired all the time Will get review with T Turner   Per Coralee North, pt needs an appt with Dr Mayford Knife to get established and continuity of care.

## 2022-10-16 DIAGNOSIS — M21621 Bunionette of right foot: Secondary | ICD-10-CM | POA: Diagnosis not present

## 2022-10-16 DIAGNOSIS — L659 Nonscarring hair loss, unspecified: Secondary | ICD-10-CM | POA: Diagnosis not present

## 2022-10-16 DIAGNOSIS — E1142 Type 2 diabetes mellitus with diabetic polyneuropathy: Secondary | ICD-10-CM | POA: Diagnosis not present

## 2022-10-16 DIAGNOSIS — M93272 Osteochondritis dissecans, left ankle and joints of left foot: Secondary | ICD-10-CM | POA: Diagnosis not present

## 2022-10-16 DIAGNOSIS — B351 Tinea unguium: Secondary | ICD-10-CM | POA: Diagnosis not present

## 2022-10-16 DIAGNOSIS — L819 Disorder of pigmentation, unspecified: Secondary | ICD-10-CM | POA: Diagnosis not present

## 2022-10-27 ENCOUNTER — Other Ambulatory Visit: Payer: Self-pay

## 2022-10-27 ENCOUNTER — Telehealth: Payer: Self-pay | Admitting: Internal Medicine

## 2022-10-27 MED ORDER — ROSUVASTATIN CALCIUM 10 MG PO TABS: 10.0000 mg | ORAL_TABLET | ORAL | 3 refills | Status: DC

## 2022-10-27 NOTE — Telephone Encounter (Signed)
*  STAT* If patient is at the pharmacy, call can be transferred to refill team.   1. Which medications need to be refilled? (please list name of each medication and dose if known) new prescription for Rosuvastatin  2. Which pharmacy/location (including street and city if local pharmacy) is medication to be sent to? [facility]  3. Do they need a 30 day or 90 day supply? 90 days and refills

## 2022-10-27 NOTE — Telephone Encounter (Signed)
Returned call to patient. He states he is having swelling while taking amlodipine. He reports taking Norvasc in the past and had to stop because it caused his feet to swell.  Patient states swelling from feet up to calves, right leg is worse than his left.  He would like to know if there is something else Dr. Tenny Craw would recommend for him to take instead of the amlodipine.  Patient has nurse visit scheduled for BP check on 11/21/22.  Will forward to Dr. Tenny Craw to review and advise.

## 2022-10-27 NOTE — Telephone Encounter (Signed)
Pt c/o medication issue:  1. Name of Medication: Amlodipine  2. How are you currently taking this medication (dosage and times per day)?   3. Are you having a reaction (difficulty breathing--STAT)?   4. What is your medication issue? feet swelling- patient wants to know if there is something else he can take?

## 2022-10-27 NOTE — Telephone Encounter (Signed)
Would try hydralaizine  10 mg tid

## 2022-10-29 DIAGNOSIS — Z79899 Other long term (current) drug therapy: Secondary | ICD-10-CM

## 2022-10-29 DIAGNOSIS — E785 Hyperlipidemia, unspecified: Secondary | ICD-10-CM

## 2022-10-29 MED ORDER — HYDRALAZINE HCL 10 MG PO TABS: 10.0000 mg | ORAL_TABLET | Freq: Three times a day (TID) | ORAL | 3 refills | Status: DC

## 2022-10-29 NOTE — Telephone Encounter (Signed)
MY CHART MESSAGE SENT TO THE PT

## 2022-10-30 ENCOUNTER — Encounter: Payer: Self-pay | Admitting: Family

## 2022-10-30 ENCOUNTER — Other Ambulatory Visit (HOSPITAL_BASED_OUTPATIENT_CLINIC_OR_DEPARTMENT_OTHER): Payer: Self-pay

## 2022-10-30 ENCOUNTER — Other Ambulatory Visit: Payer: Self-pay

## 2022-10-30 MED ORDER — AMOXICILLIN 500 MG PO CAPS
2000.0000 mg | ORAL_CAPSULE | Freq: Every day | ORAL | 0 refills | Status: DC
  Filled 2022-10-30: qty 8, 2d supply, fill #0

## 2022-10-31 MED ORDER — ROSUVASTATIN CALCIUM 10 MG PO TABS: 10.0000 mg | ORAL_TABLET | Freq: Every day | ORAL | 3 refills | Status: DC

## 2022-10-31 NOTE — Telephone Encounter (Signed)
His LDL was 66   Lets go up and take daily   TIgher control    Follow up lipomed and liver in 8 wks

## 2022-11-10 ENCOUNTER — Inpatient Hospital Stay: Payer: Medicare Other | Attending: Hematology & Oncology

## 2022-11-10 VITALS — BP 146/78 | HR 74 | Temp 97.9°F | Resp 20

## 2022-11-10 DIAGNOSIS — F419 Anxiety disorder, unspecified: Secondary | ICD-10-CM

## 2022-11-10 DIAGNOSIS — C649 Malignant neoplasm of unspecified kidney, except renal pelvis: Secondary | ICD-10-CM

## 2022-11-10 DIAGNOSIS — Z95828 Presence of other vascular implants and grafts: Secondary | ICD-10-CM

## 2022-11-10 DIAGNOSIS — Z452 Encounter for adjustment and management of vascular access device: Secondary | ICD-10-CM | POA: Insufficient documentation

## 2022-11-10 DIAGNOSIS — C651 Malignant neoplasm of right renal pelvis: Secondary | ICD-10-CM

## 2022-11-10 DIAGNOSIS — Z8551 Personal history of malignant neoplasm of bladder: Secondary | ICD-10-CM | POA: Diagnosis not present

## 2022-11-10 MED ORDER — SODIUM CHLORIDE 0.9% FLUSH
10.0000 mL | INTRAVENOUS | Status: DC | PRN
  Administered 2022-11-10: 10 mL via INTRAVENOUS

## 2022-11-10 MED ORDER — HEPARIN SOD (PORK) LOCK FLUSH 100 UNIT/ML IV SOLN
500.0000 [IU] | Freq: Once | INTRAVENOUS | Status: AC
  Administered 2022-11-10: 500 [IU] via INTRAVENOUS

## 2022-11-12 DIAGNOSIS — K080 Exfoliation of teeth due to systemic causes: Secondary | ICD-10-CM | POA: Diagnosis not present

## 2022-11-21 ENCOUNTER — Ambulatory Visit: Payer: Medicare Other | Admitting: Internal Medicine

## 2022-11-21 ENCOUNTER — Encounter: Payer: Self-pay | Admitting: Internal Medicine

## 2022-11-21 VITALS — BP 120/78 | HR 68 | Ht 69.0 in | Wt 220.8 lb

## 2022-11-21 DIAGNOSIS — I10 Essential (primary) hypertension: Secondary | ICD-10-CM

## 2022-11-21 NOTE — Patient Instructions (Signed)
Medication Instructions:  Your physician recommends that you continue on your current medications as directed. Please refer to the Current Medication list given to you today.  *If you need a refill on your cardiac medications before your next appointment, please call your pharmacy*  Lab Work: None ordered  Testing/Procedures: None ordered   Follow-Up: As scheduled.

## 2022-11-21 NOTE — Progress Notes (Signed)
   Nurse Visit   Date of Encounter: 11/21/2022 ID: Isaac [name], DOB 02-Jun-1955, MRN [id number]  PCP:  Margaree Mackintosh, MD   [facility] Providers Cardiologist:  Dietrich Pates, MD      Visit Details   VS:  BP 120/78 (BP Location: Right Arm, Patient Position: Sitting)   Pulse 68   Ht 5\' 9"  (1.753 m)   Wt 220 lb 12.8 oz (100.2 kg)   SpO2 97%   BMI 32.61 kg/m  , BMI Body mass index is 32.61 kg/m.  Wt Readings from Last 3 Encounters:  11/21/22 220 lb 12.8 oz (100.2 kg)  10/10/22 218 lb 3.2 oz (99 kg)  09/10/22 216 lb 1.3 oz (98 kg)     Reason for visit: BP Check/HTN Performed today: Vitals, Provider consulted:DOD Dr. Lynnette Caffey, and Education Changes (medications, testing, etc.) : None Length of Visit: 20 minutes  Patient is here today for BP check per Dr. Tenny Craw. Elevated BP at last OV 10/10/22. He was started on amlodipine 2.5mg  daily. He wrote in stating amlodipine causes his feet to swell. Amlodipine was discontinued and patient was started on hydralazine 10mg  TID 10/31/22. Patient has been tolerating well, he took his afternoon dose approximately 2 hours prior to today's visit. BP 120/78 manually, HR 68. Patient brought home BP cuff and checked against manual reading, home cuff reading 149/92, HR 64. Patient states BP cuff is not too old, about a couple of years old. He will try new batteries at home and will get a new BP cuff if elevated readings continue. Reviewed with DOD Dr. Lynnette Caffey, no change in treatment at this time.  Medications Adjustments/Labs and Tests Ordered: No orders of the defined types were placed in this encounter.  No orders of the defined types were placed in this encounter.  BP readings from home brought in by patient: 7/7--141/85, HR 67 7/8--152/85, HR 71 7/9--157/92, HR 79 7/10--146/86, HR 73 7/11--151/97. HR 72 7/12--156/84, HR 62  Signed, Franchot Gallo, RN  11/21/2022 2:31 PM

## 2022-11-25 ENCOUNTER — Telehealth: Payer: Self-pay

## 2022-11-25 NOTE — Telephone Encounter (Signed)
Patient is returning call. Transferred to Lelon Frohlich, Therapist, sports.

## 2022-11-25 NOTE — Telephone Encounter (Signed)
Left a message for the pt to call back to follow up with him.

## 2022-11-25 NOTE — Telephone Encounter (Signed)
I spoke with the pt and he reports that he replaced the batteries in his cuff and it is reading the same.. he plans to get a new one and he will compare the 2 when he gets it... he is seeing Dr Mayford Knife 12/29/22 and will bring his new cuff and readings with him to the office visit... he will let us know how he is doing and if he thinks he needs toi increase his BP med.

## 2022-11-25 NOTE — Telephone Encounter (Signed)
-----   Message from Dietrich Pates sent at 11/23/2022  1:28 PM EDT ----- Regarding: RE: Nurse Visit BP Check 11/21/22 Home BP cuff appears to be reading over 20 points above reading    His readings at home are high but may actually be  lower I agree with new batteries.   Need to keep validating cuff.   Whenever he goes into a doctors office he should bring cuff until this is clarified Hydralazine 10 tid is a very low dose   could increase if BP needs ----- Message ----- From: Franchot Gallo Sent: 11/21/2022   2:43 PM EDT To: Pricilla Riffle, MD; Bertram Millard, RN Subject: Nurse Visit BP Check 11/21/22                  Dr. Tenny Craw,  Here is the note from today's nurse visit for BP check:  Patient is here today for BP check per Dr. Tenny Craw. Elevated BP at last OV 10/10/22. He was started on amlodipine 2.5mg  daily. He wrote in stating amlodipine causes his feet to swell. Amlodipine was discontinued and patient was started on hydralazine 10mg  TID 10/31/22. Patient has been tolerating well, he took his afternoon dose approximately 2 hours prior to today's visit. BP 120/78 manually, HR 68. Patient brought home BP cuff and checked against manual reading, home cuff reading 149/92, HR 64. Patient states BP cuff is not too old, about a couple of years old. He will try new batteries at home and will get a new BP cuff if elevated readings continue. Reviewed with DOD Dr. Lynnette Caffey, no change in treatment at this time.  BP readings from home brought in by patient: 7/7--141/85, HR 67 7/8--152/85, HR 71 7/9--157/92, HR 79 7/10--146/86, HR 73 7/11--151/97. HR 72 7/12--156/84, HR 62

## 2022-11-26 ENCOUNTER — Telehealth: Payer: Self-pay

## 2022-11-26 NOTE — Telephone Encounter (Signed)
-----   Message from [facility]olin[facility] Junes J sent at 11/26/2022  2:58 PM EDT ----- Regarding: RE: Question Re: Next OV with Dr. Mayford Knife It does not look like he has ever seen Dr Mayford Knife, he may need to bring his machine in if he does not Ihave a chip in it. ----- Message ----- From: Luellen Pucker, RN Sent: 11/26/2022  12:28 PM EDT To: Reesa Chew, CMA Subject: FW: Question Re: Next OV with Dr. Mayford Knife       Is he one we need him to bring in his chip? Alcario Drought, RN ----- Message ----- From: Franchot Gallo Sent: 11/21/2022   5:34 PM EDT To: Luellen Pucker, RN Subject: Question Re: Next OV with Dr. Fayette Pho,  I saw patient for nurse visit today for BP check. At the visit he asked if he needs to bring his CPAP machine with him to his next appointment with Dr. Mayford Knife on 12/29/22. I told him likely not, but would reach out to you to be sure.  If you'll just let me know I will share answer with him. If you prefer to answer, he said sending a message though MyChart would be good.  Thank you, Danford Bad

## 2022-11-26 NOTE — Telephone Encounter (Signed)
Called, no answer, left detailed message per [facility] instructing patient to bring his cpap machine with him to his appt on 12/29/22 with Dr. Mayford Knife as chart review indicates it may be an older machine that requires a manual download.

## 2022-12-09 DIAGNOSIS — S61212A Laceration without foreign body of right middle finger without damage to nail, initial encounter: Secondary | ICD-10-CM | POA: Diagnosis not present

## 2022-12-09 DIAGNOSIS — E1165 Type 2 diabetes mellitus with hyperglycemia: Secondary | ICD-10-CM | POA: Diagnosis not present

## 2022-12-09 DIAGNOSIS — W268XXA Contact with other sharp object(s), not elsewhere classified, initial encounter: Secondary | ICD-10-CM | POA: Diagnosis not present

## 2022-12-09 DIAGNOSIS — Z794 Long term (current) use of insulin: Secondary | ICD-10-CM | POA: Diagnosis not present

## 2022-12-09 DIAGNOSIS — Z23 Encounter for immunization: Secondary | ICD-10-CM | POA: Diagnosis not present

## 2022-12-10 ENCOUNTER — Other Ambulatory Visit (HOSPITAL_BASED_OUTPATIENT_CLINIC_OR_DEPARTMENT_OTHER): Payer: Self-pay

## 2022-12-10 ENCOUNTER — Encounter: Payer: Self-pay | Admitting: Family

## 2022-12-10 MED ORDER — CEPHALEXIN 500 MG PO CAPS
500.0000 mg | ORAL_CAPSULE | Freq: Three times a day (TID) | ORAL | 0 refills | Status: DC
  Filled 2022-12-10: qty 21, 7d supply, fill #0

## 2022-12-11 DIAGNOSIS — M93272 Osteochondritis dissecans, left ankle and joints of left foot: Secondary | ICD-10-CM | POA: Diagnosis not present

## 2022-12-11 DIAGNOSIS — M19072 Primary osteoarthritis, left ankle and foot: Secondary | ICD-10-CM | POA: Diagnosis not present

## 2022-12-11 DIAGNOSIS — M62572 Muscle wasting and atrophy, not elsewhere classified, left ankle and foot: Secondary | ICD-10-CM | POA: Diagnosis not present

## 2022-12-24 DIAGNOSIS — Z4802 Encounter for removal of sutures: Secondary | ICD-10-CM | POA: Diagnosis not present

## 2022-12-29 ENCOUNTER — Ambulatory Visit: Payer: Medicare Other | Attending: Cardiology | Admitting: Cardiology

## 2022-12-29 ENCOUNTER — Encounter: Payer: Self-pay | Admitting: Cardiology

## 2022-12-29 VITALS — BP 130/70 | HR 76 | Ht 69.0 in | Wt 220.0 lb

## 2022-12-29 DIAGNOSIS — I10 Essential (primary) hypertension: Secondary | ICD-10-CM

## 2022-12-29 DIAGNOSIS — G4733 Obstructive sleep apnea (adult) (pediatric): Secondary | ICD-10-CM

## 2022-12-29 NOTE — Patient Instructions (Signed)
Medication Instructions:  Your physician recommends that you continue on your current medications as directed. Please refer to the Current Medication list given to you today.  *If you need a refill on your cardiac medications before your next appointment, please call your pharmacy*   Lab Work: None.  If you have labs (blood work) drawn today and your tests are completely normal, you will receive your results only by: MyChart Message (if you have MyChart) OR A paper copy in the mail If you have any lab test that is abnormal or we need to change your treatment, we will call you to review the results.   Testing/Procedures: Your physician has recommended that you have a split night sleep study. This test records several body functions during sleep, including: brain activity, eye movement, oxygen and carbon dioxide blood levels, heart rate and rhythm, breathing rate and rhythm, the flow of air through your mouth and nose, snoring, body muscle movements, and chest and belly movement.    Follow-Up: At [facility], you and your health needs are our priority.  As part of our continuing mission to provide you with exceptional heart care, we have created designated Provider Care Teams.  These Care Teams include your primary Cardiologist (physician) and Advanced Practice Providers (APPs -  Physician Assistants and Nurse Practitioners) who all work together to provide you with the care you need, when you need it.  We recommend signing up for the patient portal called "MyChart".  Sign up information is provided on this After Visit Summary.  MyChart is used to connect with patients for Virtual Visits (Telemedicine).  Patients are able to view lab/test results, encounter notes, upcoming appointments, etc.  Non-urgent messages can be sent to your provider as well.   To learn more about what you can do with MyChart, go to [email or web address].    Your next appointment will be dependent on the  results of your sleep study and it will be with:    Provider:   Dr. Armanda Magic, MD

## 2022-12-29 NOTE — Progress Notes (Signed)
Sleep Medicine CONSULT Note    Date:  12/29/2022   ID:  Jennefer Bravo, DOB Jan 30, 1956, MRN [id number]  PCP:  Margaree Mackintosh, MD  Cardiologist: Dietrich Pates, MD   Chief Complaint  Patient presents with   New Patient (Initial Visit)    Obstructive sleep apnea    History of Present Illness:  DASHAY SOELBERG is a 67 y.o. male who is being seen today for the evaluation of obstructive sleep apnea at the request of Dietrich Pates, MD.  This is a 67 year old male with a history of bladder cancer with chemo induced neuropathy, CKD stage III AAA, CAD, hyperlipidemia, depression, GERD and diabetes mellitus.  He has a history of obstructive sleep apnea using CPAP.  He was followed by Dr. Mellody Dance clamps up until 2013 when he left.  The patient has not been seen by a sleep medicine physician since then.  He is now referred for sleep medicine consultation to establish sleep care.  I do not have any prior sleep studies or PAP compliance downloads and there is nothing in Dr. Sallyanne Kuster old office notes about what pressure setting he is on.  He tells me that he was dx with OSA in 2005 and RLS.  He does not know what pressure he is on.   He is doing well with his PAP device and thinks that he has gotten used to it.  It has taken him years to tolerate his mask and the one he uses still makes his nose sore some. He currently uses a FFM. He tried the nasal mask but is a mouth breather.  He feels the pressure is adequate.  Since going on PAP he feels rested in the am but sometimes naps during the day.  He denies any significant mouth or nasal dryness or nasal congestion.  He does not think that he snores.    Past Medical History:  Diagnosis Date   Arthritis    Bladder cancer [facility]) urologist-- dr Marlou Porch   11/ 2019 recurrent    Cancer of renal pelvis, right [facility])    oncologist-  dr Myna Hidalgo--  high grade papillary urothelial carcinoma, Stage III (T3 Nx M0) -- 02-12-2017  s/p  left nephrectomy--- completed  chemotherapy 07-27-2017   Cataract    Chemotherapy-induced neuropathy (HCC)    feet   CKD (chronic kidney disease) stage 3, GFR 30-59 ml/min (HCC)    Coronary artery disease    Coronary vasospasm (HCC) PER DR ROSS NOTE 03-22-2012--  INTER. 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/ reimplantation   Hyperlipidemia    Hypertension    Left renal artery stenosis (HCC)    Myocardial infarction (HCC) 2005   Nocturia more than twice per night    OSA on CPAP    Pneumonia    Renal artery stenosis (HCC)    Sleep apnea    Thrombocytopenia (HCC)    Type 2 diabetes  mellitus treated with insulin [facility])    endocrinologist-- dr Morrison Old ([facility] in [place])  lov note in epic    Urgency of urination    Wears glasses    Wisdom teeth extracted     Past Surgical History:  Procedure Laterality Date   APPENDECTOMY  08/03/2004   CALCANEAL OSTEOTOMY Left 10/01/2021   Procedure: LEFT FOOT MEDIAL DISPLACEMENT CALCANEAL OSTEOTOMY;  Surgeon: [name], MD;  Location: [facility] OR;  Service: Orthopedics;  Laterality: Left;  LENGTH OF SURGERY: 120 MINUTES   CARDIAC CATHETERIZATION  x3  last one 06-17-2007   MILD NON-OBSTRUCTIVE CAD/ NORMAL LVF/ 30% LEFT RENAL ARTERY STENOSIS   CARDIOVASCULAR STRESS TEST  02/23/2013   Low risk nuclear study w/ small inferior wall infarct at mid and basal level with no ischemia/  normal LV function and wall motion , ef 57%   COLON SURGERY     CYSTO/ BILATERAL RETROGRADE PYELOGRAM/ RIGHT  URETEROSCOPIC LASER FULGURATION URETERAL TUMOR  02/16/2008   CYSTO/ BLADDER BX  09-13-2008;  08-13-2007; 04-19-2007; 06-01-2006   CYSTO/ CYSTOGRAM/ URETEROSCOPY  02/21/2009   CYSTO/ RESECTION BLADDER TUMOR/ LEFT RETROGRADE PYELOGRAM/ RIGHT URETEROSCOPY  08/07/2010   TCC OF BLADDER   S/P DISTAL URETERECTOMY/ REIMPLANTATION   CYSTO/ RIGHT URETEROSCOPY/ BX URETERAL TUMOR  10/11/2008   CYSTOSCOPY W/ RETROGRADES  04/19/2012   Procedure: CYSTOSCOPY WITH RETROGRADE PYELOGRAM;  Surgeon: Valetta Fuller, MD;  Location: [facility];  Service: Urology;  Laterality: Bilateral;  30 MINS Cysto, Biopsy, possible TURBT, Bilateral retrograde pyelograms with Mitomycin C instilation Post op    CYSTOSCOPY W/ RETROGRADES Left 03/25/2018   Procedure: CYSTOSCOPY WITH RETROGRADE PYELOGRAM;  Surgeon: Crist Fat, MD;  Location: [facility];  Service: Urology;  Laterality: Left;   CYSTOSCOPY WITH BIOPSY  04/07/2011   Procedure: CYSTOSCOPY WITH BIOPSY/ RIGHT URETEROSCOPY;  Surgeon: Valetta Fuller, MD;  Location: [facility];  Service: Urology;  Laterality: N/A;    cystoscopy, cystogram, biopsy and fulgeration   CYSTOSCOPY WITH BIOPSY  12/29/2011   Procedure: CYSTOSCOPY WITH BIOPSY;  Surgeon: Valetta Fuller, MD;  Location: [facility];  Service: Urology;  Laterality: N/A;  30 MIN  WITH FULGERATION     CYSTOSCOPY WITH RETROGRADE PYELOGRAM, URETEROSCOPY AND STENT PLACEMENT Left 04/28/2018   Procedure: LEFT RETROGRADE PYELOGRAM, DIAGNOSTIC LEFT URETEROSCOPY;  Surgeon: Crist Fat, MD;  Location: [facility];  Service: Urology;  Laterality: Left;   CYSTOSCOPY WITH STENT PLACEMENT N/A 03/09/2020   Procedure: CYSTOSCOPY WITH FIREFLYER  PLACEMENT;  Surgeon: Crist Fat, MD;  Location: [facility];  Service: Urology;  Laterality: N/A;   ELBOW SURGERY  07/07/2003   RIGHT ELBOW ARTHROSCOPY W/ OPEN RECONSTRUCTION   FLEXOR TENDON REPAIR Left 10/01/2021    Procedure: POSTERIOR TIBIAL TENDON RECONSTRUCTION;  Surgeon: [name], MD;  Location[facility] OR;  Service: Orthopedics;  Laterality: Left;   IR FLUORO GUIDE PORT INSERTION RIGHT  04/01/2017   IR US GUIDE VASC ACCESS RIGHT  04/01/2017   JOINT REPLACEMENT     bilateral knees   KNEE ARTHROSCOPY W/ DEBRIDEMENT  02/08/2004   RIGHT KNEE   OSTOMY N/A 03/09/2020   Procedure: POSSIBLE OSTOMY;  Surgeon: Karie Soda, MD;  Location: [facility];  Service: General;  Laterality: N/A;   PANENDOSCOPY N/A 03/09/2020   Procedure: PANENDOSCOPY RIGID;  Surgeon: Karie Soda, MD;  Location: [facility];  Service: General;  Laterality: N/A;   REPAIR EXTENSOR TENDON Left 10/01/2021   Procedure: CALCANEONAVICULAR LIGAMENT RECONSTRUCTION;  Surgeon: [name], MD;  Location[facility] OR;  Service: Orthopedics;  Laterality: Left;   RHINOPLASTY W/ MODIFIED CARTILAGE GRAFT  05/17/2007   INTERNAL NASAL VALVE COLLAPSE/ OSA/ SEPTAL PERFERATION   RIGHT DISTAL URETERECTOMY W/ REIMPLANTATION  10/30/2008   TCC OF RIGHT DISTAL URETER   ROBOT ASSITED LAPAROSCOPIC NEPHROURETERECTOMY Right 02/12/2017   Procedure: XI ROBOT ASSITED LAPAROSCOPIC NEPHROURETERECTOMY;  Surgeon: Crist Fat, MD;  Location: [facility];  Service: Urology;  Laterality: Right;   SHOULDER SURGERY  2005   RIGHT ROTATOR CUFF REPAIR   TOTAL KNEE ARTHROPLASTY Left 05/18/2015   TOTAL KNEE ARTHROPLASTY Left 05/18/2015   Procedure: TOTAL KNEE ARTHROPLASTY;  Surgeon: Frederico Hamman, MD;  Location: [facility] OR;  Service: Orthopedics;  Laterality: Left;   TOTAL KNEE ARTHROPLASTY Right 03/21/2016   Procedure: TOTAL KNEE ARTHROPLASTY;  Surgeon: Frederico Hamman, MD;  Location: [facility] OR;  Service: Orthopedics;  Laterality: Right;   TRANSTHORACIC ECHOCARDIOGRAM  02/03/2017   ef 60-65%, grade 1 diastolic dysfunction/  trivial PR   TRANSURETHRAL RESECTION OF BLADDER TUMOR  01/13/2007   TRANSURETHRAL RESECTION OF BLADDER TUMOR  04/19/2012   Procedure: TRANSURETHRAL  RESECTION OF BLADDER TUMOR (TURBT);  Surgeon: Valetta Fuller, MD;  Location: [facility];  Service: Urology;  Laterality: N/A;   TRANSURETHRAL RESECTION OF BLADDER TUMOR N/A 03/25/2018   Procedure: TRANSURETHRAL RESECTION OF BLADDER TUMOR (TURBT);  Surgeon: Crist Fat, MD;  Location: [facility];  Service: Urology;  Laterality: N/A;   TRANSURETHRAL RESECTION OF BLADDER TUMOR N/A 04/28/2018   Procedure: TRANSURETHRAL RESECTION OF BLADDER TUMOR (TURBT);  Surgeon: Crist Fat, MD;  Location: [facility];  Service: Urology;  Laterality: N/A;   umbillical hernia repair  2004    Current Medications: Current Meds  Medication Sig   acetaminophen (TYLENOL) 500 MG tablet Take 500 mg by mouth every 6 (six) hours as needed for moderate pain or headache.   ALPRAZolam (XANAX) 1 MG tablet Take 1 tablet (1 mg total) by mouth 3 (three) times daily as needed for anxiety.   amoxicillin (AMOXIL) 500 MG capsule Take 4 capsules (2,000 mg total) by mouth daily. Take 4 capsules (2,000 mg total) by mouth one hour prior to dental procedure.   aspirin EC 81 MG tablet Take 81 mg by mouth daily.   dapagliflozin propanediol (FARXIGA) 10 MG TABS tablet Take 1 tablet (10 mg total) by mouth daily.   glimepiride (AMARYL) 2 MG tablet TAKE 1 BY MOUTH DAILY BEFORE SUPPER (Patient taking differently: Take 2 mg by mouth 2 (two) times daily.)   hydrALAZINE (APRESOLINE) 10 MG tablet Take 1 tablet (10 mg total) by mouth 3 (three) times daily.   insulin glargine, 1 Unit Dial, (TOUJEO) 300 UNIT/ML Solostar Pen Inject 40 Units into the skin 2 (two) times daily.    lidocaine-prilocaine (EMLA) cream Apply topically once. Apply a dime size to port-a-cath 1-2 hours prior to access. Cover with [employer].   metoprolol succinate (TOPROL-XL) 25 MG 24 hr tablet TAKE 1 TABLET BY MOUTH AT BEDTIME. GENERIC EQUIVALENT FOR TOPROL-XL   metoprolol tartrate (LOPRESSOR) 100 MG tablet Take one tablet (100 mg) by  mouth 2 hours prior to your cardiac CT.   Multiple Vitamins-Minerals (MULTIVITAMIN WITH MINERALS) tablet Take 1 tablet by mouth daily.   nitroGLYCERIN (NITROSTAT) 0.4 MG SL tablet Place 1 tablet (0.4 mg total) under the tongue every 5 (five) minutes as needed for chest pain. Repeat every 5 minute up to 3 doses if no relief call  911.   ondansetron (ZOFRAN) 4 MG tablet Take 1 tablet (4 mg total) by mouth every 8 (eight) hours as needed.   oxyCODONE (OXY IR/ROXICODONE) 5 MG immediate release tablet Take 1 tablet (5 mg total) by mouth every 6 (six) to 8 (eight) hours as needed for pain.   pantoprazole (PROTONIX) 40 MG tablet Take 1 tablet (40 mg total) by mouth every morning.   PRESCRIPTION MEDICATION Inhale into the lungs at bedtime. CPAP   Probiotic Product (PROBIOTIC PO) Take 1 tablet by mouth daily.   ranolazine (RANEXA) 500 MG 12 hr tablet Take 1 tablet (500 mg total) by mouth 2 (two) times daily.   rOPINIRole (REQUIP) 2 MG tablet TAKE 1 TABLET BY MOUTH AT BEDTIME   rosuvastatin (CRESTOR) 10 MG tablet Take 1 tablet (10 mg total) by mouth daily.   Semaglutide,0.25 or 0.5MG /DOS, (OZEMPIC, 0.25 OR 0.5 MG/DOSE,) 2 MG/3ML SOPN Inject 0.25 mg into the skin once a week.   tadalafil (CIALIS) 20 MG tablet Take 1 tablet (20 mg total) by mouth daily as needed.   tadalafil (CIALIS) 5 MG tablet Take 1 tablet (5 mg total) by mouth daily.    Allergies:   Nsaids and Norvasc [amlodipine besylate]   Social History   Socioeconomic History   Marital status: Single    Spouse name: Not on file   Number of children: Not on file   Years of education: Not on file   Highest education level: Not on file  Occupational History   Not on file  Tobacco Use   Smoking status: Former    Current packs/day: 0.00    Types: Cigarettes    Start date: 05/13/1987    Quit date: 05/13/2007    Years since quitting: 15.6   Smokeless tobacco: Never  Vaping Use   Vaping status: Never Used  Substance and Sexual Activity    Alcohol use: Not Currently    Alcohol/week: 4.0 - 5.0 standard drinks of alcohol    Types: 1 Glasses of wine, 3 - 4 Cans of beer per week   Drug use: No   Sexual activity: Not Currently  Other Topics Concern   Not on file  Social History Narrative   Family history: Father died of an accident at age 20.  Father with history of CABG.  Mother died at age 64 apparently of accidental carbon monoxide poisoning when she locked herself out of the house and stayed in the running car to keep warm.  Maternal grandmother with history of diabetes.  1 sister.  Maternal grandfather with history of lung cancer.   Social Determinants of Health   Financial Resource Strain: Low Risk  (06/06/2022)   Received from [facility], [facility]   Overall Financial Resource Strain (CARDIA)    Difficulty of Paying Living Expenses: Not hard at all  Food Insecurity: No Food Insecurity (06/06/2022)   Received from [facility], [facility]   Hunger Vital Sign    Worried About Running Out of Food in the Last Year: Never true    Ran Out of Food in the Last Year: Never true  Transportation Needs: No Transportation Needs (06/06/2022)   Received from [facility], [facility]   PRAPARE - Transportation    Lack of Transportation (Medical): No    Lack of Transportation (Non-Medical): No  Physical Activity: Insufficiently Active (06/06/2022)   Received from [facility]   Exercise Vital Sign    Days of Exercise per Week: 1 day  Minutes of Exercise per Session: 10 min  Stress: No Stress Concern Present (06/06/2022)   Received from [facility], [facility] of Occupational Health - Occupational Stress Questionnaire    Feeling of Stress : Not at all  Social Connections: Socially Integrated (06/06/2022)   Received from [facility], [facility]   Social Network    How would you rate your social network (family, work, friends)?: Good participation with social networks   Recent Concern: Social Connections - Moderately Isolated (04/30/2022)   Social Connection and Isolation Panel [NHANES]    Frequency of Communication with Friends and Family: More than three times a week    Frequency of Social Gatherings with Friends and Family: More than three times a week    Attends Religious Services: 1 to 4 times per year    Active Member of [employer] or Organizations: No    Attends Engineer, structural: Never    Marital Status: Never married     Family History:  The patient's family history includes Coronary artery disease in his father; Diabetes in his maternal grandmother; Heart disease in his paternal grandfather.   ROS:   Please see the history of present illness.    ROS All other systems reviewed and are negative.      No data to display             PHYSICAL EXAM:   VS:  Pulse 76   Ht 5\' 9"  (1.753 m)   Wt 220 lb (99.8 kg)   BMI 32.49 kg/m    GEN: Well nourished, well developed, in no acute distress  HEENT: small posterior oropharynx Neck: no JVD, carotid bruits, or masses Cardiac: RRR; no murmurs, rubs, or gallops,no edema.  Intact distal pulses bilaterally.  Respiratory:  clear to auscultation bilaterally, normal work of breathing GI: soft, nontender, nondistended, + BS MS: no deformity or atrophy  Skin: warm and dry, no rash Neuro:  Alert and Oriented x 3, Strength and sensation are intact Psych: euthymic mood, full affect  Wt Readings from Last 3 Encounters:  12/29/22 220 lb (99.8 kg)  11/21/22 220 lb 12.8 oz (100.2 kg)  10/10/22 218 lb 3.2 oz (99 kg)      Studies/Labs Reviewed:   none  Recent Labs: 09/10/2022: ALT 34; BUN 23; Creatinine 1.84; Hemoglobin 14.4; Platelet Count 118; Potassium 3.7; Sodium 133 10/10/2022: TSH 1.920    Additional studies/ records that were reviewed today include:  none    ASSESSMENT:    1. OSA (obstructive sleep apnea)   2. Essential hypertension      PLAN:  In order of problems listed  above:  OSA - The patient is tolerating PAP therapy well without any problems. The PAP download performed by his DME was personally reviewed and interpreted by me today and showed an AHI of 1.3/hr on 12 cm H2O with 34% compliance in using more than 4 hours nightly.  The patient has been using and benefiting from PAP use and will continue to benefit from therapy.  -his device is very old and would like a new device which we can get downloads off of [employer] -I will get a new split night sleep study since his last one was 20 years ago  2.  HTN -BP controlled on exam today -Continue prescription drug management with hydralazine 10 mg 3 times daily, Toprol-XL 25 mg nightly with as needed refills  Time Spent 20 minutes total time of encounter, including 15 minutes spent  in face-to-face patient care on the date of this encounter. This time includes coordination of care and counseling regarding above mentioned problem list. Remainder of non-face-to-face time involved reviewing chart documents/testing relevant to the patient encounter and documentation in the medical record. I have independently reviewed documentation from referring provider  Medication Adjustments/Labs and Tests Ordered: Current medicines are reviewed at length with the patient today.  Concerns regarding medicines are outlined above.  Medication changes, Labs and Tests ordered today are listed in the Patient Instructions below.  There are no Patient Instructions on file for this visit.   Signed, Armanda Magic, MD  12/29/2022 2:21 PM    [facility] [address] [place], [place], [place]  [address] Phone: [phone]; Fax: [phone]

## 2022-12-30 ENCOUNTER — Ambulatory Visit: Payer: Medicare Other | Admitting: Internal Medicine

## 2023-01-01 DIAGNOSIS — E1165 Type 2 diabetes mellitus with hyperglycemia: Secondary | ICD-10-CM | POA: Diagnosis not present

## 2023-01-01 DIAGNOSIS — N1832 Chronic kidney disease, stage 3b: Secondary | ICD-10-CM | POA: Diagnosis not present

## 2023-01-01 DIAGNOSIS — Z794 Long term (current) use of insulin: Secondary | ICD-10-CM | POA: Diagnosis not present

## 2023-01-01 DIAGNOSIS — E1122 Type 2 diabetes mellitus with diabetic chronic kidney disease: Secondary | ICD-10-CM | POA: Diagnosis not present

## 2023-01-09 ENCOUNTER — Telehealth: Payer: Self-pay | Admitting: *Deleted

## 2023-01-09 NOTE — Telephone Encounter (Signed)
-----   Message from Nurse Alcario Drought E sent at 12/29/2022  3:09 PM EDT ----- Regarding: DME Patient needs a DME Company. Alcario Drought

## 2023-01-09 NOTE — Telephone Encounter (Signed)
his device is very old and would like a new device which we can get downloads off of [employer] -I will get a new split night sleep study since his last one was 20 years ago.  Split night study ordered.

## 2023-01-13 DIAGNOSIS — M93272 Osteochondritis dissecans, left ankle and joints of left foot: Secondary | ICD-10-CM | POA: Diagnosis not present

## 2023-01-13 DIAGNOSIS — B351 Tinea unguium: Secondary | ICD-10-CM | POA: Diagnosis not present

## 2023-01-13 DIAGNOSIS — M21621 Bunionette of right foot: Secondary | ICD-10-CM | POA: Diagnosis not present

## 2023-01-13 DIAGNOSIS — E1142 Type 2 diabetes mellitus with diabetic polyneuropathy: Secondary | ICD-10-CM | POA: Diagnosis not present

## 2023-01-14 ENCOUNTER — Encounter: Payer: Self-pay | Admitting: Hematology & Oncology

## 2023-01-14 ENCOUNTER — Inpatient Hospital Stay: Payer: Medicare Other | Attending: Hematology & Oncology

## 2023-01-14 ENCOUNTER — Inpatient Hospital Stay: Payer: Medicare Other | Admitting: Hematology & Oncology

## 2023-01-14 ENCOUNTER — Inpatient Hospital Stay: Payer: Medicare Other

## 2023-01-14 VITALS — BP 134/82 | HR 76 | Temp 97.8°F | Resp 20 | Ht 69.0 in | Wt 223.0 lb

## 2023-01-14 DIAGNOSIS — Z452 Encounter for adjustment and management of vascular access device: Secondary | ICD-10-CM | POA: Insufficient documentation

## 2023-01-14 DIAGNOSIS — C649 Malignant neoplasm of unspecified kidney, except renal pelvis: Secondary | ICD-10-CM

## 2023-01-14 DIAGNOSIS — Z8551 Personal history of malignant neoplasm of bladder: Secondary | ICD-10-CM | POA: Diagnosis not present

## 2023-01-14 DIAGNOSIS — Z905 Acquired absence of kidney: Secondary | ICD-10-CM | POA: Diagnosis not present

## 2023-01-14 DIAGNOSIS — C651 Malignant neoplasm of right renal pelvis: Secondary | ICD-10-CM

## 2023-01-14 LAB — CMP (CANCER CENTER ONLY)
ALT: 32 U/L (ref 0–44)
AST: 23 U/L (ref 15–41)
Albumin: 4 g/dL (ref 3.5–5.0)
Alkaline Phosphatase: 40 U/L (ref 38–126)
Anion gap: 9 (ref 5–15)
BUN: 23 mg/dL (ref 8–23)
CO2: 24 mmol/L (ref 22–32)
Calcium: 8.5 mg/dL — ABNORMAL LOW (ref 8.9–10.3)
Chloride: 101 mmol/L (ref 98–111)
Creatinine: 1.62 mg/dL — ABNORMAL HIGH (ref 0.61–1.24)
GFR, Estimated: 46 mL/min — ABNORMAL LOW (ref >60)
Glucose, Bld: 274 mg/dL — ABNORMAL HIGH (ref 70–99)
Potassium: 4 mmol/L (ref 3.5–5.1)
Sodium: 134 mmol/L — ABNORMAL LOW (ref 135–145)
Total Bilirubin: 0.7 mg/dL (ref 0.3–1.2)
Total Protein: 6.3 g/dL — ABNORMAL LOW (ref 6.5–8.1)

## 2023-01-14 LAB — CBC WITH DIFFERENTIAL (CANCER CENTER ONLY)
Abs Immature Granulocytes: 0.04 10*3/uL (ref 0.00–0.07)
Basophils Absolute: 0 10*3/uL (ref 0.0–0.1)
Basophils Relative: 1 %
Eosinophils Absolute: 0.1 10*3/uL (ref 0.0–0.5)
Eosinophils Relative: 2 %
HCT: 41.9 % (ref 39.0–52.0)
Hemoglobin: 14.3 g/dL (ref 13.0–17.0)
Immature Granulocytes: 1 %
Lymphocytes Relative: 32 %
Lymphs Abs: 1.1 10*3/uL (ref 0.7–4.0)
MCH: 33.6 pg (ref 26.0–34.0)
MCHC: 34.1 g/dL (ref 30.0–36.0)
MCV: 98.4 fL (ref 80.0–100.0)
Monocytes Absolute: 0.3 10*3/uL (ref 0.1–1.0)
Monocytes Relative: 8 %
Neutro Abs: 2.1 10*3/uL (ref 1.7–7.7)
Neutrophils Relative %: 56 %
Platelet Count: 91 10*3/uL — ABNORMAL LOW (ref 150–400)
RBC: 4.26 MIL/uL (ref 4.22–5.81)
RDW: 13 % (ref 11.5–15.5)
WBC Count: 3.6 10*3/uL — ABNORMAL LOW (ref 4.0–10.5)
nRBC: 0 % (ref 0.0–0.2)

## 2023-01-14 LAB — LACTATE DEHYDROGENASE: LDH: 157 U/L (ref 98–192)

## 2023-01-14 MED ORDER — SODIUM CHLORIDE 0.9% FLUSH
10.0000 mL | INTRAVENOUS | Status: DC | PRN
  Administered 2023-01-14: 10 mL via INTRAVENOUS

## 2023-01-14 MED ORDER — HEPARIN SOD (PORK) LOCK FLUSH 100 UNIT/ML IV SOLN
500.0000 [IU] | Freq: Once | INTRAVENOUS | Status: AC
  Administered 2023-01-14: 500 [IU] via INTRAVENOUS

## 2023-01-14 NOTE — Progress Notes (Signed)
Hematology and Oncology Follow Up Visit  Isaac [name] [id number] March 18, 1956 67 y.o. 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 is here today for follow-up.  We last saw back in May.  He has had a pretty decent summer.  Really has had no specific complaints.  He thinks he hurt his left shoulder.  He is reaching up and felt a pull.  I do not know if you might be having a little bit of tendinitis with his rotator cuff.  I told him to wait a couple weeks.  I told him that he could try to rub some Voltaren cream on there to see if this may help.  He has had no problems with fever.  He has had no bleeding.Marland Kitchen  He goes to see the urologist soon.  I think have another cystoscopy.  He is try to manage his blood sugars.  He is on Ozempic.  He has had no problems with bowels or bladder.  There has been no leg swelling.  He does have some neuropathy which is long-term.  Currently, I would say that his performance status is probably ECOG 1.   Medications:  Allergies as of 01/14/2023       Reactions   Nsaids Other (See Comments)   Has 1 kidney left   Norvasc [amlodipine Besylate] Swelling   Feet swell        Medication List        Accurate as of January 14, 2023  2:12 PM. If you have any questions, ask your nurse or doctor.          STOP taking these medications    metoprolol tartrate 100 MG tablet Commonly known as: LOPRESSOR Stopped by: Josph Macho       TAKE these medications    acetaminophen 500 MG tablet Commonly known as: TYLENOL Take 500 mg by mouth every 6 (six) hours as needed for moderate pain or headache.   ALPRAZolam 1 MG tablet Commonly known as: XANAX Take 1 tablet (1 mg  total) by mouth 3 (three) times daily as needed for anxiety.   amoxicillin 500 MG capsule Commonly known as: AMOXIL Take 4 capsules (2,000 mg total) by mouth daily. Take 4 capsules (2,000 mg total) by mouth one hour prior to dental procedure.   aspirin EC 81 MG tablet Take 81 mg by mouth daily.   Farxiga 10 MG Tabs tablet Generic drug: dapagliflozin propanediol Take 1 tablet (10 mg total) by mouth daily.   glimepiride 2 MG tablet Commonly known as: AMARYL TAKE 1 BY MOUTH DAILY BEFORE SUPPER What changed:  how much to take how to take this when to take this additional instructions   hydrALAZINE 10 MG tablet Commonly known as: APRESOLINE Take 1 tablet (10 mg total) by mouth 3 (three) times daily.   insulin glargine (1 Unit Dial) 300 UNIT/ML Solostar Pen Commonly known as: TOUJEO Inject 40 Units into the skin 2 (two) times daily.   lidocaine-prilocaine cream Commonly known as: EMLA Apply topically once. Apply a dime size to port-a-cath 1-2 hours prior to access. Cover with [employer].   metoprolol succinate 25 MG 24 hr tablet Commonly known as: TOPROL-XL TAKE 1 TABLET BY MOUTH  AT BEDTIME. GENERIC EQUIVALENT FOR TOPROL-XL   multivitamin with minerals tablet Take 1 tablet by mouth daily.   nitroGLYCERIN 0.4 MG SL tablet Commonly known as: NITROSTAT Place 1 tablet (0.4 mg total) under the tongue every 5 (five) minutes as needed for chest pain. Repeat every 5 minute up to 3 doses if no relief call 911.   ondansetron 4 MG tablet Commonly known as: ZOFRAN Take 1 tablet (4 mg total) by mouth every 8 (eight) hours as needed.   OneTouch Delica Plus Lancet33G Misc Apply topically 3 (three) times daily.   OneTouch Verio test strip Generic drug: glucose blood 1 each 3 (three) times daily.   oxyCODONE 5 MG immediate release tablet Commonly known as: Oxy IR/ROXICODONE Take 1 tablet (5 mg total) by mouth every 6 (six) to 8 (eight) hours as needed for pain.   Ozempic (0.25 or  0.5 MG/DOSE) 2 MG/3ML Sopn Generic drug: Semaglutide(0.25 or 0.5MG /DOS) Inject 0.25 mg into the skin once a week.   pantoprazole 40 MG tablet Commonly known as: PROTONIX Take 1 tablet (40 mg total) by mouth every morning.   polyethylene glycol 17 g packet Commonly known as: MIRALAX / GLYCOLAX Take 17 g by mouth daily.   PRESCRIPTION MEDICATION Inhale into the lungs at bedtime. CPAP   PROBIOTIC PO Take 1 tablet by mouth daily.   ranolazine 500 MG 12 hr tablet Commonly known as: Ranexa Take 1 tablet (500 mg total) by mouth 2 (two) times daily.   rOPINIRole 2 MG tablet Commonly known as: REQUIP TAKE 1 TABLET BY MOUTH AT BEDTIME   rosuvastatin 10 MG tablet Commonly known as: CRESTOR Take 1 tablet (10 mg total) by mouth daily.   tadalafil 5 MG tablet Commonly known as: CIALIS Take 1 tablet (5 mg total) by mouth daily.   tadalafil 20 MG tablet Commonly known as: CIALIS Take 1 tablet (20 mg total) by mouth daily as needed.        Allergies:  Allergies  Allergen Reactions   Nsaids Other (See Comments)    Has 1 kidney left   Norvasc [Amlodipine Besylate] Swelling    Feet swell    Past Medical History, Surgical history, Social history, and Family History were reviewed and updated.  Review of Systems: Review of Systems  Constitutional: Negative.   HENT: Negative.    Eyes: Negative.   Respiratory: Negative.    Cardiovascular: Negative.   Gastrointestinal: Negative.   Genitourinary: Negative.   Musculoskeletal:  Positive for back pain.  Skin: Negative.   Neurological:  Positive for headaches.  Endo/Heme/Allergies: Negative.   Psychiatric/Behavioral: Negative.       Physical Exam:  height is 5\' 9"  (1.753 m) and weight is 223 lb (101.2 kg). His oral temperature is 97.8 F (36.6 C). His blood pressure is 134/82 and his pulse is 76. His respiration is 20 and oxygen saturation is 98%.   Wt Readings from Last 3 Encounters:  01/14/23 223 lb (101.2 kg)  12/29/22  220 lb (99.8 kg)  11/21/22 220 lb 12.8 oz (100.2 kg)    Physical Exam Vitals reviewed.  HENT:     Head: Normocephalic and atraumatic.  Eyes:     Pupils: Pupils are equal, round, and reactive to light.  Cardiovascular:     Rate and Rhythm: Normal rate and regular rhythm.     Heart sounds: Normal heart sounds.  Pulmonary:     Effort: Pulmonary effort is normal.     Breath sounds: Normal breath sounds.  Abdominal:  General: Bowel sounds are normal.     Palpations: Abdomen is soft.  Musculoskeletal:        General: No tenderness or deformity. Normal range of motion.     Cervical back: Normal range of motion.     Comments: He has a brace on his left ankle.  There may be a little bit of swelling in the left lower leg.  Lymphadenopathy:     Cervical: No cervical adenopathy.  Skin:    General: Skin is warm and dry.     Findings: No erythema or rash.  Neurological:     Mental Status: He is alert and oriented to person, place, and time.  Psychiatric:        Behavior: Behavior normal.        Thought Content: Thought content normal.        Judgment: Judgment normal.     Lab Results  Component Value Date   WBC 3.6 (L) 01/14/2023   HGB 14.3 01/14/2023   HCT 41.9 01/14/2023   MCV 98.4 01/14/2023   PLT 91 (L) 01/14/2023   Lab Results  Component Value Date   FERRITIN 230 06/04/2021   IRON 129 06/04/2021   TIBC 329 06/04/2021   UIBC 200 06/04/2021   IRONPCTSAT 39 06/04/2021   Lab Results  Component Value Date   RETICCTPCT 2.0 06/04/2021   RBC 4.26 01/14/2023   No results found for: "KPAFRELGTCHN", "LAMBDASER", "KAPLAMBRATIO" Lab Results  Component Value Date   IGA 232 01/25/2015   No results found for: "TOTALPROTELP", "ALBUMINELP", "A1GS", "A2GS", "BETS", "BETA2SER", "GAMS", "MSPIKE", "SPEI"   Chemistry      Component Value Date/Time   NA 133 (L) 09/10/2022 1140   NA 139 09/03/2021 1456   NA 134 05/04/2017 0925   K 3.7 09/10/2022 1140   K 4.5 05/04/2017 0925    CL 105 09/10/2022 1140   CL 97 (L) 05/04/2017 0925   CO2 24 09/10/2022 1140   CO2 23 05/04/2017 0925   BUN 23 09/10/2022 1140   BUN 21 09/03/2021 1456   BUN 28 (H) 05/04/2017 0925   CREATININE 1.84 (H) 09/10/2022 1140   CREATININE 1.95 (H) 11/21/2021 1041   GLU 86 03/31/2016 0000      Component Value Date/Time   CALCIUM 8.4 (L) 09/10/2022 1140   CALCIUM 9.3 05/04/2017 0925   ALKPHOS 46 09/10/2022 1140   ALKPHOS 58 05/04/2017 0925   AST 26 09/10/2022 1140   ALT 34 09/10/2022 1140   ALT 90 (H) 05/04/2017 0925   BILITOT 0.6 09/10/2022 1140       Impression and Plan: [name] is a very pleasant 67 yo caucasian gentleman with infiltrating high grade papillary urothelial carcinoma with right nephrectomy in October 2018 and adjuvant chemotherapy.   So far, everything is done incredibly well with respect to the urothelial carcinoma.  It has now been 6 years since she had treatment.  I do not see any role for scans right now.  His labs look okay.  I am still awaiting the chemistries.  We will plan to get him back in another 4 months.  He still has a Port-A-Cath and so we will have to flushes every 2 months.     Josph Macho, MD 9/4/20242:12 PM

## 2023-01-14 NOTE — Patient Instructions (Signed)

## 2023-01-21 DIAGNOSIS — N183 Chronic kidney disease, stage 3 unspecified: Secondary | ICD-10-CM | POA: Diagnosis not present

## 2023-01-21 DIAGNOSIS — D631 Anemia in chronic kidney disease: Secondary | ICD-10-CM | POA: Diagnosis not present

## 2023-01-21 DIAGNOSIS — I129 Hypertensive chronic kidney disease with stage 1 through stage 4 chronic kidney disease, or unspecified chronic kidney disease: Secondary | ICD-10-CM | POA: Diagnosis not present

## 2023-01-21 DIAGNOSIS — N2581 Secondary hyperparathyroidism of renal origin: Secondary | ICD-10-CM | POA: Diagnosis not present

## 2023-01-22 LAB — LAB REPORT - SCANNED: Creatinine, POC: 76.6 mg/dL

## 2023-01-27 ENCOUNTER — Other Ambulatory Visit (HOSPITAL_BASED_OUTPATIENT_CLINIC_OR_DEPARTMENT_OTHER): Payer: Self-pay

## 2023-01-27 ENCOUNTER — Encounter: Payer: Self-pay | Admitting: Family

## 2023-01-27 DIAGNOSIS — C678 Malignant neoplasm of overlapping sites of bladder: Secondary | ICD-10-CM | POA: Diagnosis not present

## 2023-01-27 MED ORDER — TADALAFIL 20 MG PO TABS
20.0000 mg | ORAL_TABLET | Freq: Every day | ORAL | 11 refills | Status: DC | PRN
  Filled 2023-01-27: qty 30, 30d supply, fill #0
  Filled 2023-03-16: qty 30, 30d supply, fill #1
  Filled 2023-04-23: qty 30, 30d supply, fill #2
  Filled 2023-07-01: qty 30, 30d supply, fill #3
  Filled 2023-09-09: qty 30, 30d supply, fill #4
  Filled 2023-10-19: qty 30, 30d supply, fill #5
  Filled 2023-12-22: qty 30, 30d supply, fill #6
  Filled 2024-01-17: qty 30, 30d supply, fill #7

## 2023-02-10 DIAGNOSIS — K080 Exfoliation of teeth due to systemic causes: Secondary | ICD-10-CM | POA: Diagnosis not present

## 2023-02-17 ENCOUNTER — Other Ambulatory Visit: Payer: Self-pay

## 2023-02-17 MED ORDER — METOPROLOL SUCCINATE ER 25 MG PO TB24: ORAL_TABLET | ORAL | 2 refills | Status: DC

## 2023-02-17 MED ORDER — PANTOPRAZOLE SODIUM 40 MG PO TBEC: 40.0000 mg | DELAYED_RELEASE_TABLET | Freq: Every morning | ORAL | 2 refills | Status: DC

## 2023-02-18 ENCOUNTER — Encounter: Payer: Self-pay | Admitting: Gastroenterology

## 2023-02-18 DIAGNOSIS — G4733 Obstructive sleep apnea (adult) (pediatric): Secondary | ICD-10-CM | POA: Diagnosis not present

## 2023-03-16 ENCOUNTER — Inpatient Hospital Stay: Payer: Medicare Other | Attending: Hematology & Oncology

## 2023-03-16 ENCOUNTER — Other Ambulatory Visit (HOSPITAL_BASED_OUTPATIENT_CLINIC_OR_DEPARTMENT_OTHER): Payer: Self-pay

## 2023-03-16 ENCOUNTER — Encounter: Payer: Self-pay | Admitting: Family

## 2023-03-16 DIAGNOSIS — Z452 Encounter for adjustment and management of vascular access device: Secondary | ICD-10-CM | POA: Insufficient documentation

## 2023-03-16 DIAGNOSIS — C651 Malignant neoplasm of right renal pelvis: Secondary | ICD-10-CM

## 2023-03-16 DIAGNOSIS — Z8551 Personal history of malignant neoplasm of bladder: Secondary | ICD-10-CM | POA: Insufficient documentation

## 2023-03-16 MED ORDER — HEPARIN SOD (PORK) LOCK FLUSH 100 UNIT/ML IV SOLN
500.0000 [IU] | Freq: Once | INTRAVENOUS | Status: AC
  Administered 2023-03-16: 500 [IU] via INTRAVENOUS

## 2023-03-16 MED ORDER — SODIUM CHLORIDE 0.9% FLUSH
10.0000 mL | Freq: Once | INTRAVENOUS | Status: AC
  Administered 2023-03-16: 10 mL via INTRAVENOUS

## 2023-03-16 NOTE — Patient Instructions (Signed)

## 2023-03-20 ENCOUNTER — Encounter: Payer: Self-pay | Admitting: Gastroenterology

## 2023-03-24 ENCOUNTER — Telehealth: Payer: Self-pay

## 2023-03-24 NOTE — Telephone Encounter (Signed)
**Note De-Identified Aneta Hendershott Obfuscation** Per the [employer] Provider Portal: The following solutions for the service date entered do not require Pre-Authorization by [employer].  Code: 40981  Description: Sleep monitoring of patient (6 years or older) in sleep lab with continued pressured respiratory assistance by mask or breathing tube

## 2023-03-31 ENCOUNTER — Encounter: Payer: Self-pay | Admitting: Internal Medicine

## 2023-04-15 ENCOUNTER — Encounter: Payer: Self-pay | Admitting: Family

## 2023-04-15 DIAGNOSIS — D1801 Hemangioma of skin and subcutaneous tissue: Secondary | ICD-10-CM | POA: Diagnosis not present

## 2023-04-15 DIAGNOSIS — L821 Other seborrheic keratosis: Secondary | ICD-10-CM | POA: Diagnosis not present

## 2023-04-15 DIAGNOSIS — L720 Epidermal cyst: Secondary | ICD-10-CM | POA: Diagnosis not present

## 2023-04-15 DIAGNOSIS — C4441 Basal cell carcinoma of skin of scalp and neck: Secondary | ICD-10-CM | POA: Diagnosis not present

## 2023-04-15 DIAGNOSIS — D485 Neoplasm of uncertain behavior of skin: Secondary | ICD-10-CM | POA: Diagnosis not present

## 2023-04-15 DIAGNOSIS — Z85828 Personal history of other malignant neoplasm of skin: Secondary | ICD-10-CM | POA: Diagnosis not present

## 2023-04-15 DIAGNOSIS — L570 Actinic keratosis: Secondary | ICD-10-CM | POA: Diagnosis not present

## 2023-04-16 ENCOUNTER — Encounter: Payer: Self-pay | Admitting: Family

## 2023-04-23 ENCOUNTER — Other Ambulatory Visit (HOSPITAL_BASED_OUTPATIENT_CLINIC_OR_DEPARTMENT_OTHER): Payer: Self-pay

## 2023-04-23 ENCOUNTER — Ambulatory Visit (HOSPITAL_BASED_OUTPATIENT_CLINIC_OR_DEPARTMENT_OTHER): Payer: Medicare Other | Attending: Cardiology | Admitting: Cardiology

## 2023-04-23 ENCOUNTER — Ambulatory Visit: Payer: Medicare Other | Admitting: *Deleted

## 2023-04-23 VITALS — Ht 69.0 in | Wt 213.0 lb

## 2023-04-23 DIAGNOSIS — G4733 Obstructive sleep apnea (adult) (pediatric): Secondary | ICD-10-CM | POA: Diagnosis not present

## 2023-04-23 DIAGNOSIS — Z860100 Personal history of colon polyps, unspecified: Secondary | ICD-10-CM

## 2023-04-23 MED ORDER — NA SULFATE-K SULFATE-MG SULF 17.5-3.13-1.6 GM/177ML PO SOLN
1.0000 | Freq: Once | ORAL | 0 refills | Status: AC
Start: 2023-04-23 — End: 2023-04-25
  Filled 2023-04-23: qty 354, 2d supply, fill #0

## 2023-04-23 NOTE — Progress Notes (Signed)
Pt's name and DOB verified at the beginning of the pre-visit wit 2 identifiers  Pt denies any difficulty with ambulating,sitting, laying down or rolling side to side  Pt has no issues with ambulation   Pt has no issues moving head neck or swallowing  No egg or soy allergy known to patient   No issues known to pt with past sedation with any surgeries or procedures  Pt denies having issues being intubated  No FH of Malignant Hyperthermia  Pt is not on diet pills or shots  Pt is not on home 02   Pt is not on blood thinners   Pt has frequent issues with constipation RN instructed pt to use Miralax per bottles instructions a week before prep days. Pt states they will  Pt is not on dialysis  Pt denise any abnormal heart rhythms   Pt denies any upcoming cardiac testing  Pt encouraged to use to use Singlecare or Goodrx to reduce cost   Patient's chart reviewed by Cathlyn Parsons CNRA prior to pre-visit and patient appropriate for the [facility].  Pre-visit completed and red dot placed by patient's name on their procedure day (on provider's schedule).  .  Visit in person  Pt scale weight is 213 lb Instructed pt why it is important to and  to call if they have any changes in health or new medications. Directed them to the # given and on instructions.     Instructions reviewed. Pt given both [facility] main # and MD on call # prior to instructions.  Pt states understanding. Instructed to review again prior to procedure. Pt states they will.    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

## 2023-04-24 ENCOUNTER — Other Ambulatory Visit (HOSPITAL_BASED_OUTPATIENT_CLINIC_OR_DEPARTMENT_OTHER): Payer: Self-pay

## 2023-04-24 ENCOUNTER — Encounter: Payer: Self-pay | Admitting: Internal Medicine

## 2023-04-24 ENCOUNTER — Encounter: Payer: Self-pay | Admitting: Family
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
## 2023-04-28 DIAGNOSIS — C4441 Basal cell carcinoma of skin of scalp and neck: Secondary | ICD-10-CM | POA: Diagnosis not present

## 2023-04-30 NOTE — Progress Notes (Signed)
Notified patient of sleep study results and recommendations. All questions (if any) were answered and patient verbalized understanding. CPAP Titration ordered today.

## 2023-04-30 NOTE — Procedures (Signed)
   Patient Name: Isaac [name], Isaac [name] Date:112/04/2023 Gender: Male D.O.B: Sep 20, 1955 Age (years): 61 Referring Provider: Armanda Magic MD, ABSM Height (inches): 69 Interpreting Physician: Armanda Magic MD, ABSM Weight (lbs): 223 RPSGT: Cherylann Parr BMI: 33 MRN: [id number] Neck Size: 18.00  CLINICAL INFORMATION Sleep Study Type: NPSG  Indication for sleep study: Obesity, Snoring, Witnesses Apnea / Gasping During Sleep  Epworth Sleepiness Score: 8  SLEEP STUDY TECHNIQUE As per the AASM Manual for the Scoring of Sleep and Associated Events v2.3 (April 2016) with a hypopnea requiring 4% desaturations.  The channels recorded and monitored were frontal, central and occipital EEG, electrooculogram (EOG), submentalis EMG (chin), nasal and oral airflow, thoracic and abdominal wall motion, anterior tibialis EMG, snore microphone, electrocardiogram, and pulse oximetry.  MEDICATIONS Medications self-administered by patient taken the night of the study : N/A  SLEEP ARCHITECTURE The study was initiated at 9:47:48 PM and ended at 4:32:43 AM.  Sleep onset time was 12.1 minutes and the sleep efficiency was 74.3%. The total sleep time was 300.8 minutes.  Stage REM latency was 272.5 minutes.  The patient spent 3.5% of the night in stage N1 sleep, 86.4% in stage N2 sleep, 0.0% in stage N3 and 10.1% in REM.  Alpha intrusion was absent.  Supine sleep was 40.06%.  RESPIRATORY PARAMETERS The overall apnea/hypopnea index (AHI) was 30.3 per hour. There were 56 total apneas, including 53 obstructive, 2 central and 1 mixed apneas. There were 96 hypopneas and 0 RERAs.  The AHI during Stage REM sleep was 21.6 per hour.  AHI while supine was 42.3 per hour.  The mean oxygen saturation was 92.8%. The minimum SpO2 during sleep was 81.0%.  loud snoring was noted during this study.  CARDIAC DATA The 2 lead EKG demonstrated sinus rhythm. The mean heart rate was 71.5 beats per minute. Other EKG  findings include: PVCs  LEG MOVEMENT DATA The total PLMS were 0 with a resulting PLMS index of 0.0. Associated arousal with leg movement index was 0.0 .  IMPRESSIONS - Moderate obstructive sleep apnea occurred during this study (AHI = 30.3/h). - No significant central sleep apnea occurred during this study (CAI = 0.4/h). - Mild oxygen desaturation was noted during this study (Min O2 = 81.0%). - The patient snored with loud snoring volume. - PVCs were noted during this study. - Clinically significant periodic limb movements did not occur during sleep. No significant associated arousals.  DIAGNOSIS - Obstructive Sleep Apnea (G47.33) - Nocturnal Hypoxemia (G47.36)  RECOMMENDATIONS - Therapeutic CPAP titration to determine optimal pressure required to alleviate sleep disordered breathing. - Positional therapy avoiding supine position during sleep. - Avoid alcohol, sedatives and other CNS depressants that may worsen sleep apnea and disrupt normal sleep architecture. - Sleep hygiene should be reviewed to assess factors that may improve sleep quality. - Weight management and regular exercise should be initiated or continued if appropriate.  [Electronically signed] 04/30/2023 07:15 AM  Armanda Magic MD, ABSM Diplomate, American Board of Sleep Medicine

## 2023-04-30 NOTE — Addendum Note (Signed)
Addended by: Ashley Mariner C on: 04/30/2023 01:00 PM   Modules accepted: Orders

## 2023-05-01 ENCOUNTER — Other Ambulatory Visit: Payer: Medicare Other

## 2023-05-01 ENCOUNTER — Telehealth: Payer: Self-pay

## 2023-05-01 DIAGNOSIS — I10 Essential (primary) hypertension: Secondary | ICD-10-CM

## 2023-05-01 DIAGNOSIS — E119 Type 2 diabetes mellitus without complications: Secondary | ICD-10-CM

## 2023-05-01 DIAGNOSIS — N184 Chronic kidney disease, stage 4 (severe): Secondary | ICD-10-CM

## 2023-05-01 DIAGNOSIS — M8580 Other specified disorders of bone density and structure, unspecified site: Secondary | ICD-10-CM

## 2023-05-01 DIAGNOSIS — E785 Hyperlipidemia, unspecified: Secondary | ICD-10-CM | POA: Diagnosis not present

## 2023-05-01 DIAGNOSIS — Z0000 Encounter for general adult medical examination without abnormal findings: Secondary | ICD-10-CM | POA: Diagnosis not present

## 2023-05-01 DIAGNOSIS — Z125 Encounter for screening for malignant neoplasm of prostate: Secondary | ICD-10-CM

## 2023-05-01 NOTE — Telephone Encounter (Signed)
Spoke with pt. regarding PAP Sanofi,Novo Nordisk,[employer]&ME(Toujeo,Ozempic,Farxiga),pt ask to mail all three PAP, he couldn't remember if he has already fill Sanofi and dropped  in to Dr office but ask to mail then all,pt will fill and drop off at Dr. Isidore Moos.

## 2023-05-02 LAB — CBC WITH DIFFERENTIAL/PLATELET
Absolute Lymphocytes: 1325 {cells}/uL (ref 850–3900)
Absolute Monocytes: 517 {cells}/uL (ref 200–950)
Basophils Absolute: 28 {cells}/uL (ref 0–200)
Basophils Relative: 0.6 %
Eosinophils Absolute: 80 {cells}/uL (ref 15–500)
Eosinophils Relative: 1.7 %
HCT: 49.5 % (ref 38.5–50.0)
Hemoglobin: 16.4 g/dL (ref 13.2–17.1)
MCH: 33.3 pg — ABNORMAL HIGH (ref 27.0–33.0)
MCHC: 33.1 g/dL (ref 32.0–36.0)
MCV: 100.6 fL — ABNORMAL HIGH (ref 80.0–100.0)
MPV: 9.3 fL (ref 7.5–12.5)
Monocytes Relative: 11 %
Neutro Abs: 2750 {cells}/uL (ref 1500–7800)
Neutrophils Relative %: 58.5 %
Platelets: 128 10*3/uL — ABNORMAL LOW (ref 140–400)
RBC: 4.92 10*6/uL (ref 4.20–5.80)
RDW: 12.2 % (ref 11.0–15.0)
Total Lymphocyte: 28.2 %
WBC: 4.7 10*3/uL (ref 3.8–10.8)

## 2023-05-02 LAB — MICROALBUMIN / CREATININE URINE RATIO
Creatinine, Urine: 129 mg/dL (ref 20–320)
Microalb Creat Ratio: 36 mg/g{creat} — ABNORMAL HIGH (ref <30)
Microalb, Ur: 4.6 mg/dL

## 2023-05-02 LAB — PSA: PSA: 0.5 ng/mL (ref <=4.00)

## 2023-05-02 LAB — COMPLETE METABOLIC PANEL WITH GFR
AG Ratio: 1.6 (calc) (ref 1.0–2.5)
ALT: 31 U/L (ref 9–46)
AST: 22 U/L (ref 10–35)
Albumin: 4.6 g/dL (ref 3.6–5.1)
Alkaline phosphatase (APISO): 56 U/L (ref 35–144)
BUN/Creatinine Ratio: 10 (calc) (ref 6–22)
BUN: 20 mg/dL (ref 7–25)
CO2: 28 mmol/L (ref 20–32)
Calcium: 9.5 mg/dL (ref 8.6–10.3)
Chloride: 101 mmol/L (ref 98–110)
Creat: 1.94 mg/dL — ABNORMAL HIGH (ref 0.70–1.35)
Globulin: 2.9 g/dL (ref 1.9–3.7)
Glucose, Bld: 104 mg/dL — ABNORMAL HIGH (ref 65–99)
Potassium: 4.6 mmol/L (ref 3.5–5.3)
Sodium: 138 mmol/L (ref 135–146)
Total Bilirubin: 0.9 mg/dL (ref 0.2–1.2)
Total Protein: 7.5 g/dL (ref 6.1–8.1)
eGFR: 37 mL/min/{1.73_m2} — ABNORMAL LOW (ref >=60)

## 2023-05-02 LAB — HEMOGLOBIN A1C
Hgb A1c MFr Bld: 7.4 %{Hb} — ABNORMAL HIGH (ref <5.7)
Mean Plasma Glucose: 166 mg/dL
eAG (mmol/L): 9.2 mmol/L

## 2023-05-02 LAB — LIPID PANEL
Cholesterol: 152 mg/dL (ref <200)
HDL: 35 mg/dL — ABNORMAL LOW (ref >=40)
LDL Cholesterol (Calc): 89 mg/dL
Non-HDL Cholesterol (Calc): 117 mg/dL (ref <130)
Total CHOL/HDL Ratio: 4.3 (calc) (ref <5.0)
Triglycerides: 190 mg/dL — ABNORMAL HIGH (ref <150)

## 2023-05-04 ENCOUNTER — Other Ambulatory Visit: Payer: Self-pay

## 2023-05-04 ENCOUNTER — Ambulatory Visit (INDEPENDENT_AMBULATORY_CARE_PROVIDER_SITE_OTHER): Payer: Medicare Other | Admitting: Internal Medicine

## 2023-05-04 ENCOUNTER — Encounter: Payer: Self-pay | Admitting: Internal Medicine

## 2023-05-04 VITALS — BP 110/72 | HR 75 | Ht 69.0 in | Wt 215.0 lb

## 2023-05-04 DIAGNOSIS — T451X5A Adverse effect of antineoplastic and immunosuppressive drugs, initial encounter: Secondary | ICD-10-CM

## 2023-05-04 DIAGNOSIS — N401 Enlarged prostate with lower urinary tract symptoms: Secondary | ICD-10-CM

## 2023-05-04 DIAGNOSIS — I10 Essential (primary) hypertension: Secondary | ICD-10-CM

## 2023-05-04 DIAGNOSIS — E1159 Type 2 diabetes mellitus with other circulatory complications: Secondary | ICD-10-CM | POA: Diagnosis not present

## 2023-05-04 DIAGNOSIS — Z860101 Personal history of adenomatous and serrated colon polyps: Secondary | ICD-10-CM

## 2023-05-04 DIAGNOSIS — N186 End stage renal disease: Secondary | ICD-10-CM

## 2023-05-04 DIAGNOSIS — F419 Anxiety disorder, unspecified: Secondary | ICD-10-CM

## 2023-05-04 DIAGNOSIS — G620 Drug-induced polyneuropathy: Secondary | ICD-10-CM

## 2023-05-04 DIAGNOSIS — N184 Chronic kidney disease, stage 4 (severe): Secondary | ICD-10-CM

## 2023-05-04 DIAGNOSIS — H903 Sensorineural hearing loss, bilateral: Secondary | ICD-10-CM

## 2023-05-04 DIAGNOSIS — M545 Low back pain, unspecified: Secondary | ICD-10-CM

## 2023-05-04 DIAGNOSIS — C679 Malignant neoplasm of bladder, unspecified: Secondary | ICD-10-CM

## 2023-05-04 DIAGNOSIS — G2581 Restless legs syndrome: Secondary | ICD-10-CM

## 2023-05-04 DIAGNOSIS — Z905 Acquired absence of kidney: Secondary | ICD-10-CM

## 2023-05-04 DIAGNOSIS — K219 Gastro-esophageal reflux disease without esophagitis: Secondary | ICD-10-CM

## 2023-05-04 DIAGNOSIS — F32A Depression, unspecified: Secondary | ICD-10-CM

## 2023-05-04 DIAGNOSIS — H2513 Age-related nuclear cataract, bilateral: Secondary | ICD-10-CM

## 2023-05-04 DIAGNOSIS — Z96653 Presence of artificial knee joint, bilateral: Secondary | ICD-10-CM

## 2023-05-04 DIAGNOSIS — I252 Old myocardial infarction: Secondary | ICD-10-CM

## 2023-05-04 DIAGNOSIS — Z87891 Personal history of nicotine dependence: Secondary | ICD-10-CM

## 2023-05-04 DIAGNOSIS — E0842 Diabetes mellitus due to underlying condition with diabetic polyneuropathy: Secondary | ICD-10-CM

## 2023-05-04 DIAGNOSIS — E786 Lipoprotein deficiency: Secondary | ICD-10-CM

## 2023-05-04 DIAGNOSIS — E781 Pure hyperglyceridemia: Secondary | ICD-10-CM

## 2023-05-04 DIAGNOSIS — G4733 Obstructive sleep apnea (adult) (pediatric): Secondary | ICD-10-CM

## 2023-05-04 DIAGNOSIS — H9193 Unspecified hearing loss, bilateral: Secondary | ICD-10-CM

## 2023-05-04 DIAGNOSIS — Z0000 Encounter for general adult medical examination without abnormal findings: Secondary | ICD-10-CM | POA: Diagnosis not present

## 2023-05-04 DIAGNOSIS — D696 Thrombocytopenia, unspecified: Secondary | ICD-10-CM | POA: Insufficient documentation

## 2023-05-04 DIAGNOSIS — D631 Anemia in chronic kidney disease: Secondary | ICD-10-CM

## 2023-05-04 DIAGNOSIS — Z8551 Personal history of malignant neoplasm of bladder: Secondary | ICD-10-CM

## 2023-05-04 DIAGNOSIS — I2510 Atherosclerotic heart disease of native coronary artery without angina pectoris: Secondary | ICD-10-CM

## 2023-05-04 DIAGNOSIS — Z87820 Personal history of traumatic brain injury: Secondary | ICD-10-CM

## 2023-05-04 DIAGNOSIS — Z85828 Personal history of other malignant neoplasm of skin: Secondary | ICD-10-CM

## 2023-05-04 LAB — POCT URINALYSIS DIP (CLINITEK)
Bilirubin, UA: NEGATIVE
Blood, UA: NEGATIVE
Glucose, UA: 500 mg/dL — AB
Ketones, POC UA: NEGATIVE mg/dL
Leukocytes, UA: NEGATIVE
Nitrite, UA: NEGATIVE
POC PROTEIN,UA: NEGATIVE
Spec Grav, UA: 1.015 (ref 1.010–1.025)
Urobilinogen, UA: 0.2 U/dL
pH, UA: 6 (ref 5.0–8.0)

## 2023-05-04 MED ORDER — RANOLAZINE ER 500 MG PO TB12: 500.0000 mg | ORAL_TABLET | Freq: Two times a day (BID) | ORAL | 1 refills | Status: DC

## 2023-05-11 ENCOUNTER — Ambulatory Visit: Payer: Medicare Other | Admitting: Internal Medicine

## 2023-05-11 ENCOUNTER — Encounter: Payer: Self-pay | Admitting: Internal Medicine

## 2023-05-11 VITALS — BP 118/82 | HR 72 | Temp 98.4°F | Resp 15 | Ht 69.0 in | Wt 213.0 lb

## 2023-05-11 DIAGNOSIS — D123 Benign neoplasm of transverse colon: Secondary | ICD-10-CM

## 2023-05-11 DIAGNOSIS — K5730 Diverticulosis of large intestine without perforation or abscess without bleeding: Secondary | ICD-10-CM | POA: Diagnosis not present

## 2023-05-11 DIAGNOSIS — Z1211 Encounter for screening for malignant neoplasm of colon: Secondary | ICD-10-CM | POA: Diagnosis not present

## 2023-05-11 DIAGNOSIS — D122 Benign neoplasm of ascending colon: Secondary | ICD-10-CM

## 2023-05-11 DIAGNOSIS — K648 Other hemorrhoids: Secondary | ICD-10-CM

## 2023-05-11 DIAGNOSIS — Z860100 Personal history of colon polyps, unspecified: Secondary | ICD-10-CM

## 2023-05-11 HISTORY — PX: COLONOSCOPY: SHX174

## 2023-05-11 MED ORDER — SODIUM CHLORIDE 0.9 % IV SOLN: 500.0000 mL | Freq: Once | INTRAVENOUS | Status: DC

## 2023-05-11 NOTE — Patient Instructions (Signed)
Educational handout provided to patient related to Hemorrhoids, Polyps, and Diverticulosis  Resume previous diet  Continue present medications  PLEASE FOLLOW UP WITH DERMATOLOGIST  Awaiting pathology results   YOU HAD AN ENDOSCOPIC PROCEDURE TODAY AT THE [facility]:   Refer to the procedure report that was given to you for any specific questions about what was found during the examination.  If the procedure report does not answer your questions, please call your gastroenterologist to clarify.  If you requested that your care partner not be given the details of your procedure findings, then the procedure report has been included in a sealed envelope for you to review at your convenience later.  YOU SHOULD EXPECT: Some feelings of bloating in the abdomen. Passage of more gas than usual.  Walking can help get rid of the air that was put into your GI tract during the procedure and reduce the bloating. If you had a lower endoscopy (such as a colonoscopy or flexible sigmoidoscopy) you may notice spotting of blood in your stool or on the toilet paper. If you underwent a bowel prep for your procedure, you may not have a normal bowel movement for a few days.  Please Note:  You might notice some irritation and congestion in your nose or some drainage.  This is from the oxygen used during your procedure.  There is no need for concern and it should clear up in a day or so.  SYMPTOMS TO REPORT IMMEDIATELY:  Following lower endoscopy (colonoscopy or flexible sigmoidoscopy):  Excessive amounts of blood in the stool  Significant tenderness or worsening of abdominal pains  Swelling of the abdomen that is new, acute  Fever of 100F or higher  For urgent or emergent issues, a gastroenterologist can be reached at any hour by calling [phone]. Do not use MyChart messaging for urgent concerns.    DIET:  We do recommend a small meal at first, but then you may proceed to your regular diet.   Drink plenty of fluids but you should avoid alcoholic beverages for 24 hours.  ACTIVITY:  You should plan to take it easy for the rest of today and you should NOT DRIVE or use heavy machinery until tomorrow (because of the sedation medicines used during the test).    FOLLOW UP: Our staff will call the number listed on your records the next business day following your procedure.  We will call around 7:15- 8:00 am to check on you and address any questions or concerns that you may have regarding the information given to you following your procedure. If we do not reach you, we will leave a message.     If any biopsies were taken you will be contacted by phone or by letter within the next 1-3 weeks.  Please call us at [phone] if you have not heard about the biopsies in 3 weeks.    SIGNATURES/CONFIDENTIALITY: You and/or your care partner have signed paperwork which will be entered into your electronic medical record.  These signatures attest to the fact that that the information above on your After Visit Summary has been reviewed and is understood.  Full responsibility of the confidentiality of this discharge information lies with you and/or your care-partner.

## 2023-05-11 NOTE — Progress Notes (Signed)
Called to room to assist during endoscopic procedure.  Patient ID and intended procedure confirmed with present staff. Received instructions for my participation in the procedure from the performing physician.  

## 2023-05-11 NOTE — Progress Notes (Signed)
GASTROENTEROLOGY PROCEDURE H&P NOTE   Primary Care Physician: Margaree Mackintosh, MD    Reason for Procedure:   History of colon polyps  Plan:    Colonoscopy  Patient is appropriate for endoscopic procedure(s) in the ambulatory ([facility]) setting.  The nature of the procedure, as well as the risks, benefits, and alternatives were carefully and thoroughly reviewed with the patient. Ample time for discussion and questions allowed. The patient understood, was satisfied, and agreed to proceed.     HPI: Isaac [name] is a 67 y.o. male who presents for colonoscopy for history of colon polyps. Denies blood in stools, changes in bowel habits, or unintentional weight loss. Denies family history of colon cancer.  Colonoscopy 03/08/20: 9 5-7 mm colon polyps, diverticula, hemorrhoids.   Past Medical History:  Diagnosis Date   Arthritis    Bladder cancer [facility]) urologist-- dr Marlou Porch   11/ 2019 recurrent    Cancer of renal pelvis, right [facility]    oncologist-  dr Myna Hidalgo--  high grade papillary urothelial carcinoma, Stage III (T3 Nx M0) -- 02-12-2017  s/p  left nephrectomy--- completed chemotherapy 07-27-2017   Cataract    Chemotherapy-induced neuropathy (HCC)    feet   CKD (chronic kidney disease) stage 3, GFR 30-59 ml/min (HCC)    Coronary artery disease    Coronary vasospasm (HCC) PER DR ROSS NOTE 03-22-2012--  INTER. 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/ reimplantation   Hyperlipidemia    Hypertension     Left renal artery stenosis (HCC)    Myocardial infarction (HCC) 2005   Nocturia more than twice per night    OSA on CPAP    Pneumonia    Renal artery stenosis (HCC)    Sleep apnea    Thrombocytopenia (HCC)    Type 2 diabetes mellitus treated with insulin [facility])    endocrinologist-- dr Morrison Old ([facility] in [place])  lov note in epic    Urgency of urination    Wears glasses    Wisdom teeth extracted     Past Surgical History:  Procedure Laterality Date   APPENDECTOMY  08/03/2004   CALCANEAL OSTEOTOMY Left 10/01/2021   Procedure: LEFT FOOT MEDIAL DISPLACEMENT CALCANEAL OSTEOTOMY;  Surgeon: [name], MD;  Location: [facility] OR;  Service: Orthopedics;  Laterality: Left;  LENGTH OF SURGERY: 120 MINUTES   CARDIAC CATHETERIZATION  x3  last one 06-17-2007   MILD NON-OBSTRUCTIVE CAD/ NORMAL LVF/ 30% LEFT RENAL ARTERY STENOSIS   CARDIOVASCULAR STRESS TEST  02/23/2013   Low risk nuclear study w/ small inferior wall infarct at mid and basal level with no ischemia/  normal LV function and wall motion , ef 57%   COLON SURGERY     CYSTO/ BILATERAL RETROGRADE PYELOGRAM/ RIGHT URETEROSCOPIC LASER FULGURATION URETERAL TUMOR  02/16/2008   CYSTO/ BLADDER BX  09-13-2008;  08-13-2007; 04-19-2007; 06-01-2006   CYSTO/ CYSTOGRAM/ URETEROSCOPY  02/21/2009   CYSTO/ RESECTION BLADDER TUMOR/ LEFT  RETROGRADE PYELOGRAM/ RIGHT URETEROSCOPY  08/07/2010   TCC OF BLADDER   S/P DISTAL URETERECTOMY/ REIMPLANTATION   CYSTO/ RIGHT URETEROSCOPY/ BX URETERAL TUMOR  10/11/2008   CYSTOSCOPY W/ RETROGRADES  04/19/2012   Procedure: CYSTOSCOPY WITH RETROGRADE PYELOGRAM;  Surgeon: Valetta Fuller, MD;  Location: [facility];  Service: Urology;  Laterality: Bilateral;  30 MINS Cysto, Biopsy, possible TURBT, Bilateral retrograde pyelograms with Mitomycin C instilation Post op    CYSTOSCOPY W/ RETROGRADES Left 03/25/2018   Procedure: CYSTOSCOPY WITH RETROGRADE PYELOGRAM;  Surgeon: Crist Fat, MD;   Location: [facility];  Service: Urology;  Laterality: Left;   CYSTOSCOPY WITH BIOPSY  04/07/2011   Procedure: CYSTOSCOPY WITH BIOPSY/ RIGHT URETEROSCOPY;  Surgeon: Valetta Fuller, MD;  Location: [facility];  Service: Urology;  Laterality: N/A;    cystoscopy, cystogram, biopsy and fulgeration   CYSTOSCOPY WITH BIOPSY  12/29/2011   Procedure: CYSTOSCOPY WITH BIOPSY;  Surgeon: Valetta Fuller, MD;  Location: [facility];  Service: Urology;  Laterality: N/A;  30 MIN  WITH FULGERATION     CYSTOSCOPY WITH RETROGRADE PYELOGRAM, URETEROSCOPY AND STENT PLACEMENT Left 04/28/2018   Procedure: LEFT RETROGRADE PYELOGRAM, DIAGNOSTIC LEFT URETEROSCOPY;  Surgeon: Crist Fat, MD;  Location: [facility];  Service: Urology;  Laterality: Left;   CYSTOSCOPY WITH STENT PLACEMENT N/A 03/09/2020   Procedure: CYSTOSCOPY WITH FIREFLYER  PLACEMENT;  Surgeon: Crist Fat, MD;  Location: [facility];  Service: Urology;  Laterality: N/A;   ELBOW SURGERY  07/07/2003   RIGHT ELBOW ARTHROSCOPY W/ OPEN RECONSTRUCTION   FLEXOR TENDON REPAIR Left 10/01/2021   Procedure: POSTERIOR TIBIAL TENDON RECONSTRUCTION;  Surgeon: [name], MD;  Location[facility] OR;  Service: Orthopedics;  Laterality: Left;   IR FLUORO GUIDE PORT INSERTION RIGHT  04/01/2017   IR US GUIDE VASC ACCESS RIGHT  04/01/2017   JOINT REPLACEMENT     bilateral knees   KNEE ARTHROSCOPY W/ DEBRIDEMENT  02/08/2004   RIGHT KNEE   OSTOMY N/A 03/09/2020   Procedure: POSSIBLE OSTOMY;  Surgeon: Karie Soda, MD;  Location: [facility];  Service: General;  Laterality: N/A;   PANENDOSCOPY N/A 03/09/2020   Procedure: PANENDOSCOPY RIGID;  Surgeon: Karie Soda, MD;  Location: [facility];  Service: General;  Laterality: N/A;   REPAIR EXTENSOR TENDON Left 10/01/2021   Procedure: CALCANEONAVICULAR LIGAMENT RECONSTRUCTION;  Surgeon: [name], MD;  Location: [facility] OR;  Service: Orthopedics;  Laterality: Left;    RHINOPLASTY W/ MODIFIED CARTILAGE GRAFT  05/17/2007   INTERNAL NASAL VALVE COLLAPSE/ OSA/ SEPTAL PERFERATION   RIGHT DISTAL URETERECTOMY W/ REIMPLANTATION  10/30/2008   TCC OF RIGHT DISTAL URETER   ROBOT ASSITED LAPAROSCOPIC NEPHROURETERECTOMY Right 02/12/2017   Procedure: XI ROBOT ASSITED LAPAROSCOPIC NEPHROURETERECTOMY;  Surgeon: Crist Fat, MD;  Location: [facility];  Service: Urology;  Laterality: Right;   SHOULDER SURGERY  2005   RIGHT ROTATOR CUFF REPAIR   TOTAL KNEE ARTHROPLASTY Left 05/18/2015   TOTAL KNEE ARTHROPLASTY Left 05/18/2015   Procedure: TOTAL KNEE ARTHROPLASTY;  Surgeon: Frederico Hamman, MD;  Location: [facility] OR;  Service: Orthopedics;  Laterality: Left;   TOTAL KNEE ARTHROPLASTY Right 03/21/2016   Procedure: TOTAL KNEE ARTHROPLASTY;  Surgeon: Frederico Hamman, MD;  Location: [facility] OR;  Service: Orthopedics;  Laterality: Right;   TRANSTHORACIC ECHOCARDIOGRAM  02/03/2017   ef 60-65%, grade 1 diastolic dysfunction/  trivial PR   TRANSURETHRAL RESECTION OF BLADDER TUMOR  01/13/2007   TRANSURETHRAL RESECTION OF BLADDER TUMOR  04/19/2012  Procedure: TRANSURETHRAL RESECTION OF BLADDER TUMOR (TURBT);  Surgeon: Valetta Fuller, MD;  Location: [facility];  Service: Urology;  Laterality: N/A;   TRANSURETHRAL RESECTION OF BLADDER TUMOR N/A 03/25/2018   Procedure: TRANSURETHRAL RESECTION OF BLADDER TUMOR (TURBT);  Surgeon: Crist Fat, MD;  Location: [facility];  Service: Urology;  Laterality: N/A;   TRANSURETHRAL RESECTION OF BLADDER TUMOR N/A 04/28/2018   Procedure: TRANSURETHRAL RESECTION OF BLADDER TUMOR (TURBT);  Surgeon: Crist Fat, MD;  Location: [facility];  Service: Urology;  Laterality: N/A;   umbillical hernia repair  2004    Prior to Admission medications   Medication Sig Start Date End Date Taking? Authorizing Provider  aspirin EC 81 MG tablet Take 81 mg by mouth daily. 05/23/21  Yes [provider]  dapagliflozin  propanediol (FARXIGA) 10 MG TABS tablet Take 1 tablet (10 mg total) by mouth daily. 06/10/22  Yes   glimepiride (AMARYL) 2 MG tablet TAKE 1 BY MOUTH DAILY BEFORE SUPPER Patient taking differently: Take 2 mg by mouth 2 (two) times daily. 11/28/15  Yes Reather Littler, MD  hydrALAZINE (APRESOLINE) 10 MG tablet Take 1 tablet (10 mg total) by mouth 3 (three) times daily. 10/29/22  Yes Pricilla Riffle, MD  insulin glargine, 1 Unit Dial, (TOUJEO) 300 UNIT/ML Solostar Pen Inject 40 Units into the skin 2 (two) times daily.    Yes [provider]  Lancets (ONETOUCH DELICA PLUS LANCET33G) MISC Apply topically 3 (three) times daily. 11/19/22  Yes [provider]  metoprolol succinate (TOPROL-XL) 25 MG 24 hr tablet TAKE 1 TABLET BY MOUTH AT BEDTIME. GENERIC EQUIVALENT FOR TOPROL-XL 02/17/23  Yes Pricilla Riffle, MD  Multiple Vitamins-Minerals (MULTIVITAMIN WITH MINERALS) tablet Take 1 tablet by mouth daily.   Yes [provider]  pantoprazole (PROTONIX) 40 MG tablet Take 1 tablet (40 mg total) by mouth every morning. 02/17/23  Yes Pricilla Riffle, MD  PRESCRIPTION MEDICATION Inhale into the lungs at bedtime. CPAP   Yes [provider]  Probiotic Product (PROBIOTIC PO) Take 1 tablet by mouth daily.   Yes [provider]  ranolazine (RANEXA) 500 MG 12 hr tablet Take 1 tablet (500 mg total) by mouth 2 (two) times daily. 05/04/23  Yes Pricilla Riffle, MD  rOPINIRole (REQUIP) 2 MG tablet TAKE 1 TABLET BY MOUTH AT BEDTIME 06/30/22  Yes [name], Luanna Cole, MD  rosuvastatin (CRESTOR) 10 MG tablet Take 1 tablet (10 mg total) by mouth daily. 10/31/22  Yes Pricilla Riffle, MD  acetaminophen (TYLENOL) 500 MG tablet Take 500 mg by mouth every 6 (six) hours as needed for moderate pain or headache. Patient not taking: Reported on 01/14/2023    [provider]  ALPRAZolam Prudy Feeler) 1 MG tablet Take 1 tablet (1 mg total) by mouth 3 (three) times daily as needed for anxiety. Patient not taking: Reported  on 05/11/2023 09/10/22   Josph Macho, MD  lidocaine-prilocaine (EMLA) cream Apply topically once. Apply a dime size to port-a-cath 1-2 hours prior to access. Cover with [employer]. 05/23/20   [provider]  nitroGLYCERIN (NITROSTAT) 0.4 MG SL tablet Place 1 tablet (0.4 mg total) under the tongue every 5 (five) minutes as needed for chest pain. Repeat every 5 minute up to 3 doses if no relief call 911. Patient not taking: Reported on 05/11/2023 11/09/19   Pricilla Riffle, MD  ondansetron (ZOFRAN) 4 MG tablet Take 1 tablet (4 mg total) by mouth every 8 (eight)  hours as needed. Patient not taking: Reported on 05/11/2023 09/30/21   Josph Macho, MD  [facility] VERIO test strip 1 each 3 (three) times daily. Patient not taking: Reported on 05/11/2023 11/19/22   [provider]  oxyCODONE (OXY IR/ROXICODONE) 5 MG immediate release tablet Take 1 tablet (5 mg total) by mouth every 6 (six) to 8 (eight) hours as needed for pain. Patient not taking: Reported on 05/11/2023 12/25/21     polyethylene glycol (MIRALAX / GLYCOLAX) 17 g packet Take 17 g by mouth daily. Patient not taking: Reported on 05/11/2023    [provider]  Semaglutide,0.25 or 0.5MG /DOS, (OZEMPIC, 0.25 OR 0.5 MG/DOSE,) 2 MG/3ML SOPN Inject 0.25 mg into the skin once a week. 03/12/22     tadalafil (CIALIS) 20 MG tablet Take 1 tablet (20 mg total) by mouth daily as needed. Patient not taking: Reported on 04/23/2023 07/29/22     tadalafil (CIALIS) 20 MG tablet Take 1 tablet (20 mg total) by mouth daily as needed. 01/27/23     tadalafil (CIALIS) 5 MG tablet Take 1 tablet (5 mg total) by mouth daily. Patient not taking: Reported on 04/23/2023 01/28/22       Current Outpatient Medications  Medication Sig Dispense Refill   aspirin EC 81 MG tablet Take 81 mg by mouth daily.     dapagliflozin propanediol (FARXIGA) 10 MG TABS tablet Take 1 tablet (10 mg total) by mouth daily. 30 tablet 2   glimepiride (AMARYL) 2 MG tablet  TAKE 1 BY MOUTH DAILY BEFORE SUPPER (Patient taking differently: Take 2 mg by mouth 2 (two) times daily.) 90 tablet 1   hydrALAZINE (APRESOLINE) 10 MG tablet Take 1 tablet (10 mg total) by mouth 3 (three) times daily. 270 tablet 3   insulin glargine, 1 Unit Dial, (TOUJEO) 300 UNIT/ML Solostar Pen Inject 40 Units into the skin 2 (two) times daily.      Lancets (ONETOUCH DELICA PLUS LANCET33G) MISC Apply topically 3 (three) times daily.     metoprolol succinate (TOPROL-XL) 25 MG 24 hr tablet TAKE 1 TABLET BY MOUTH AT BEDTIME. GENERIC EQUIVALENT FOR TOPROL-XL 90 tablet 2   Multiple Vitamins-Minerals (MULTIVITAMIN WITH MINERALS) tablet Take 1 tablet by mouth daily.     pantoprazole (PROTONIX) 40 MG tablet Take 1 tablet (40 mg total) by mouth every morning. 90 tablet 2   PRESCRIPTION MEDICATION Inhale into the lungs at bedtime. CPAP     Probiotic Product (PROBIOTIC PO) Take 1 tablet by mouth daily.     ranolazine (RANEXA) 500 MG 12 hr tablet Take 1 tablet (500 mg total) by mouth 2 (two) times daily. 180 tablet 1   rOPINIRole (REQUIP) 2 MG tablet TAKE 1 TABLET BY MOUTH AT BEDTIME 90 tablet 3   rosuvastatin (CRESTOR) 10 MG tablet Take 1 tablet (10 mg total) by mouth daily. 90 tablet 3   acetaminophen (TYLENOL) 500 MG tablet Take 500 mg by mouth every 6 (six) hours as needed for moderate pain or headache. (Patient not taking: Reported on 01/14/2023)     ALPRAZolam (XANAX) 1 MG tablet Take 1 tablet (1 mg total) by mouth 3 (three) times daily as needed for anxiety. (Patient not taking: Reported on 05/11/2023) 30 tablet 0   lidocaine-prilocaine (EMLA) cream Apply topically once. Apply a dime size to port-a-cath 1-2 hours prior to access. Cover with [employer].     nitroGLYCERIN (NITROSTAT) 0.4 MG SL tablet Place 1 tablet (0.4 mg total) under the tongue every 5 (five) minutes as needed  for chest pain. Repeat every 5 minute up to 3 doses if no relief call 911. (Patient not taking: Reported on 05/11/2023) 25 tablet 6    ondansetron (ZOFRAN) 4 MG tablet Take 1 tablet (4 mg total) by mouth every 8 (eight) hours as needed. (Patient not taking: Reported on 05/11/2023) 30 tablet 3   ONETOUCH VERIO test strip 1 each 3 (three) times daily. (Patient not taking: Reported on 05/11/2023)     oxyCODONE (OXY IR/ROXICODONE) 5 MG immediate release tablet Take 1 tablet (5 mg total) by mouth every 6 (six) to 8 (eight) hours as needed for pain. (Patient not taking: Reported on 05/11/2023) 24 tablet 0   polyethylene glycol (MIRALAX / GLYCOLAX) 17 g packet Take 17 g by mouth daily. (Patient not taking: Reported on 05/11/2023)     Semaglutide,0.25 or 0.5MG /DOS, (OZEMPIC, 0.25 OR 0.5 MG/DOSE,) 2 MG/3ML SOPN Inject 0.25 mg into the skin once a week. 3 mL 3   tadalafil (CIALIS) 20 MG tablet Take 1 tablet (20 mg total) by mouth daily as needed. (Patient not taking: Reported on 04/23/2023) 30 tablet 3   tadalafil (CIALIS) 20 MG tablet Take 1 tablet (20 mg total) by mouth daily as needed. 30 tablet 11   tadalafil (CIALIS) 5 MG tablet Take 1 tablet (5 mg total) by mouth daily. (Patient not taking: Reported on 04/23/2023) 30 tablet 5   Current Facility-Administered Medications  Medication Dose Route Frequency Provider Last Rate Last Admin   0.9 %  sodium chloride infusion  500 mL Intravenous Once Imogene Burn, MD       Facility-Administered Medications Ordered in Other Visits  Medication Dose Route Frequency Provider Last Rate Last Admin   LORazepam (ATIVAN) injection 0.5 mg  0.5 mg Intravenous Once Erenest Blank, NP       sodium chloride flush (NS) 0.9 % injection 10 mL  10 mL Intravenous PRN Josph Macho, MD   10 mL at 06/08/17 1150   sodium chloride flush (NS) 0.9 % injection 10 mL  10 mL Intravenous PRN Josph Macho, MD   10 mL at 06/04/21 1153    Allergies as of 05/11/2023 - Review Complete 05/11/2023  Allergen Reaction Noted   Nsaids Other (See Comments) 02/21/2020   Norvasc [amlodipine besylate] Swelling 10/27/2022     Family History  Problem Relation Age of Onset   Coronary artery disease Father    Diabetes Maternal Grandmother    Heart disease Paternal Grandfather    Colon cancer Neg Hx    Stomach cancer Neg Hx    Esophageal cancer Neg Hx    Rectal cancer Neg Hx    Colon polyps Neg Hx     Social History   Socioeconomic History   Marital status: Single    Spouse name: Not on file   Number of children: Not on file   Years of education: Not on file   Highest education level: Not on file  Occupational History   Not on file  Tobacco Use   Smoking status: Former    Current packs/day: 0.00    Types: Cigarettes    Start date: 05/13/1987    Quit date: 05/13/2007    Years since quitting: 16.0   Smokeless tobacco: Never  Vaping Use   Vaping status: Never Used  Substance and Sexual Activity   Alcohol use: Not Currently    Alcohol/week: 4.0 - 5.0 standard drinks of alcohol    Types: 1 Glasses of wine, 3 - 4 Cans  of beer per week   Drug use: No   Sexual activity: Not Currently  Other Topics Concern   Not on file  Social History Narrative   Family history: Father died of an accident at age 37.  Father with history of CABG.  Mother died at age 33 apparently of accidental carbon monoxide poisoning when she locked herself out of the house and stayed in the running car to keep warm.  Maternal grandmother with history of diabetes.  1 sister.  Maternal grandfather with history of lung cancer.   Social Drivers of Corporate investment banker Strain: Low Risk  (06/06/2022)   Received from [facility], [facility]   Overall Financial Resource Strain (CARDIA)    Difficulty of Paying Living Expenses: Not hard at all  Food Insecurity: No Food Insecurity (04/29/2023)   Hunger Vital Sign    Worried About Running Out of Food in the Last Year: Never true    Ran Out of Food in the Last Year: Never true  Transportation Needs: No Transportation Needs (04/29/2023)   PRAPARE - Scientist, research (physical sciences) (Medical): No    Lack of Transportation (Non-Medical): No  Physical Activity: Insufficiently Active (06/06/2022)   Received from [facility], [facility]   Exercise Vital Sign    Days of Exercise per Week: 1 day    Minutes of Exercise per Session: 10 min  Stress: No Stress Concern Present (06/06/2022)   Received from [facility], [facility] - Occupational Stress Questionnaire    Feeling of Stress : Not at all  Social Connections: Socially Integrated (06/06/2022)   Received from [facility], [facility]   Social Network    How would you rate your social network (family, work, friends)?: Good participation with social networks  Recent Concern: Social Connections - Moderately Isolated (04/30/2022)   Social Connection and Isolation Panel [NHANES]    Frequency of Communication with Friends and Family: More than three times a week    Frequency of Social Gatherings with Friends and Family: More than three times a week    Attends Religious Services: 1 to 4 times per year    Active Member of [employer] or Organizations: No    Attends Banker Meetings: Never    Marital Status: Never married  Intimate Partner Violence: Not At Risk (06/06/2022)   Received from [employer], [facility]   HITS    Over the last 12 months how often did your partner physically hurt you?: Never    Over the last 12 months how often did your partner insult you or talk down to you?: Never    Over the last 12 months how often did your partner threaten you with physical harm?: Never    Over the last 12 months how often did your partner scream or curse at you?: Never    Physical Exam: Vital signs in last 24 hours: BP (!) 145/78   Pulse 79   Temp 98.4 F (36.9 C) (Skin)   Ht 5\' 9"  (1.753 m)   Wt 213 lb (96.6 kg)   SpO2 98%   BMI 31.45 kg/m  GEN: NAD EYE: Sclerae anicteric ENT: MMM CV: Non-tachycardic Pulm: No increased work of  breathing GI: Soft, NT/ND NEURO:  Alert & Oriented   Eulah Pont, MD [facility]  05/11/2023 2:02 PM

## 2023-05-11 NOTE — Op Note (Addendum)
[facility] Patient Name: Isaac [name] Procedure Date: 05/11/2023 2:42 PM MRN: [id number] Endoscopist: Madelyn Brunner [place] , , [id number] Age: 67 Referring MD:  Date of Birth: 09-29-1955 Gender: Male Account #: [id number] Procedure:                Colonoscopy Indications:              High risk colon cancer surveillance: Personal                            history of colonic polyps Medicines:                Monitored Anesthesia Care Procedure:                Pre-Anesthesia Assessment:                           - Prior to the procedure, a History and Physical                            was performed, and patient medications and                            allergies were reviewed. The patient's tolerance of                            previous anesthesia was also reviewed. The risks                            and benefits of the procedure and the sedation                            options and risks were discussed with the patient.                            All questions were answered, and informed consent                            was obtained. Prior Anticoagulants: The patient has                            taken no anticoagulant or antiplatelet agents. ASA                            Grade Assessment: III - A patient with severe                            systemic disease. After reviewing the risks and                            benefits, the patient was deemed in satisfactory                            condition to undergo the procedure.  After obtaining informed consent, the colonoscope                            was passed under direct vision. Throughout the                            procedure, the patient's blood pressure, pulse, and                            oxygen saturations were monitored continuously. The                            Olympus Scope SN [id number] was introduced through the                            anus and advanced to the  the terminal ileum. The                            colonoscopy was performed without difficulty. The                            patient tolerated the procedure well. The quality                            of the bowel preparation was good. The terminal                            ileum, ileocecal valve, appendiceal orifice, and                            rectum were photographed. Scope In: 2:46:30 PM Scope Out: 3:15:56 PM Scope Withdrawal Time: 0 hours 25 minutes 2 seconds  Total Procedure Duration: 0 hours 29 minutes 26 seconds  Findings:                 The terminal ileum appeared normal.                           Eight sessile polyps were found in the transverse                            colon and ascending colon. The polyps were 3 to 8                            mm in size. These polyps were removed with a cold                            snare. Resection and retrieval were complete.                           Multiple diverticula were found in the sigmoid                            colon and descending colon.  Non-bleeding internal hemorrhoids were found during                            retroflexion. Complications:            No immediate complications. Estimated Blood Loss:     Estimated blood loss was minimal. Impression:               - The examined portion of the ileum was normal.                           - Eight 3 to 8 mm polyps in the transverse colon                            and in the ascending colon, removed with a cold                            snare. Resected and retrieved.                           - Diverticulosis in the sigmoid colon and in the                            descending colon.                           - Non-bleeding internal hemorrhoids. Recommendation:           - Discharge patient to home (with escort).                           - Await pathology results.                           - Please see your dermatologist for perianal  skin                            changes.                           - The findings and recommendations were discussed                            with the patient. Dr Particia Lather "[name]" [name],  05/11/2023 3:23:01 PM

## 2023-05-11 NOTE — Progress Notes (Signed)
Pt's states no medical or surgical changes since previsit or office visit. 

## 2023-05-11 NOTE — Progress Notes (Signed)
To pacu, VSS. Report to [email or web address].tb 

## 2023-05-12 ENCOUNTER — Telehealth: Payer: Self-pay | Admitting: *Deleted

## 2023-05-12 NOTE — Patient Instructions (Signed)
He does incredibly well with multiple medical issues. These seem stable at the present time. He will continue with his routine medical appointments and follow up here in one year or as needed.

## 2023-05-12 NOTE — Telephone Encounter (Signed)
Post procedure follow up call placed, no answer and left VM.  

## 2023-05-14 DIAGNOSIS — B351 Tinea unguium: Secondary | ICD-10-CM | POA: Diagnosis not present

## 2023-05-14 DIAGNOSIS — E1142 Type 2 diabetes mellitus with diabetic polyneuropathy: Secondary | ICD-10-CM | POA: Diagnosis not present

## 2023-05-15 ENCOUNTER — Inpatient Hospital Stay (HOSPITAL_BASED_OUTPATIENT_CLINIC_OR_DEPARTMENT_OTHER): Payer: Medicare Other | Admitting: Hematology & Oncology

## 2023-05-15 ENCOUNTER — Inpatient Hospital Stay: Payer: Medicare Other | Attending: Hematology & Oncology

## 2023-05-15 ENCOUNTER — Telehealth: Payer: Self-pay

## 2023-05-15 ENCOUNTER — Inpatient Hospital Stay: Payer: Medicare Other

## 2023-05-15 ENCOUNTER — Encounter: Payer: Self-pay | Admitting: Hematology & Oncology

## 2023-05-15 VITALS — Ht 69.0 in | Wt 219.0 lb

## 2023-05-15 VITALS — BP 122/81 | HR 72 | Temp 98.0°F | Resp 16

## 2023-05-15 DIAGNOSIS — C651 Malignant neoplasm of right renal pelvis: Secondary | ICD-10-CM

## 2023-05-15 DIAGNOSIS — Z452 Encounter for adjustment and management of vascular access device: Secondary | ICD-10-CM | POA: Insufficient documentation

## 2023-05-15 DIAGNOSIS — Z8551 Personal history of malignant neoplasm of bladder: Secondary | ICD-10-CM | POA: Diagnosis not present

## 2023-05-15 DIAGNOSIS — Z905 Acquired absence of kidney: Secondary | ICD-10-CM | POA: Insufficient documentation

## 2023-05-15 LAB — CBC WITH DIFFERENTIAL (CANCER CENTER ONLY)
Abs Immature Granulocytes: 0.03 10*3/uL (ref 0.00–0.07)
Basophils Absolute: 0 10*3/uL (ref 0.0–0.1)
Basophils Relative: 1 %
Eosinophils Absolute: 0.1 10*3/uL (ref 0.0–0.5)
Eosinophils Relative: 3 %
HCT: 38.4 % — ABNORMAL LOW (ref 39.0–52.0)
Hemoglobin: 13.5 g/dL (ref 13.0–17.0)
Immature Granulocytes: 1 %
Lymphocytes Relative: 27 %
Lymphs Abs: 1.2 10*3/uL (ref 0.7–4.0)
MCH: 33.8 pg (ref 26.0–34.0)
MCHC: 35.2 g/dL (ref 30.0–36.0)
MCV: 96.2 fL (ref 80.0–100.0)
Monocytes Absolute: 0.5 10*3/uL (ref 0.1–1.0)
Monocytes Relative: 11 %
Neutro Abs: 2.5 10*3/uL (ref 1.7–7.7)
Neutrophils Relative %: 57 %
Platelet Count: 101 10*3/uL — ABNORMAL LOW (ref 150–400)
RBC: 3.99 MIL/uL — ABNORMAL LOW (ref 4.22–5.81)
RDW: 12.5 % (ref 11.5–15.5)
WBC Count: 4.4 10*3/uL (ref 4.0–10.5)
nRBC: 0 % (ref 0.0–0.2)

## 2023-05-15 LAB — CMP (CANCER CENTER ONLY)
ALT: 27 U/L (ref 0–44)
AST: 22 U/L (ref 15–41)
Albumin: 3.9 g/dL (ref 3.5–5.0)
Alkaline Phosphatase: 45 U/L (ref 38–126)
Anion gap: 8 (ref 5–15)
BUN: 26 mg/dL — ABNORMAL HIGH (ref 8–23)
CO2: 26 mmol/L (ref 22–32)
Calcium: 8.9 mg/dL (ref 8.9–10.3)
Chloride: 102 mmol/L (ref 98–111)
Creatinine: 1.94 mg/dL — ABNORMAL HIGH (ref 0.61–1.24)
GFR, Estimated: 37 mL/min — ABNORMAL LOW (ref >60)
Glucose, Bld: 120 mg/dL — ABNORMAL HIGH (ref 70–99)
Potassium: 3.8 mmol/L (ref 3.5–5.1)
Sodium: 136 mmol/L (ref 135–145)
Total Bilirubin: 0.8 mg/dL (ref 0.0–1.2)
Total Protein: 6.1 g/dL — ABNORMAL LOW (ref 6.5–8.1)

## 2023-05-15 LAB — LACTATE DEHYDROGENASE: LDH: 171 U/L (ref 98–192)

## 2023-05-15 MED ORDER — SODIUM CHLORIDE 0.9% FLUSH
10.0000 mL | INTRAVENOUS | Status: DC | PRN
Start: 2023-05-15 — End: 2023-05-15
  Administered 2023-05-15: 10 mL via INTRAVENOUS

## 2023-05-15 MED ORDER — HEPARIN SOD (PORK) LOCK FLUSH 100 UNIT/ML IV SOLN
500.0000 [IU] | Freq: Once | INTRAVENOUS | Status: AC
  Administered 2023-05-15: 500 [IU] via INTRAVENOUS

## 2023-05-15 NOTE — Progress Notes (Signed)
 Hematology and Oncology Follow Up Visit  Isaac [name] [id number] June 22, 1955 68 y.o. 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  We last saw back in September.  He has been doing quite well.  He recently had a colonoscopy.  Surprising, he had 8 polyps.  I think spike any another colonoscopy in about 3 years.  He also has a sebaceous cyst on the top of his left shoulder.  His go see the dermatologist on the 14th and try to have this opened up.  He is doing well with his weight.  His weight is coming down.  Continues on his Ozempic .  It also was helping his blood sugars.  He has had no change in bowel or bladder habits.  He has had no fever.  He has had no problems with COVID.  There is been no leg swelling.  Does have neuropathy in his feet because of his diabetes.  Overall, I would have to say that his performance status is probably ECOG 1.    Medications:  Allergies as of 05/15/2023       Reactions   Nsaids Other (See Comments)   Has 1 kidney left   Norvasc  [amlodipine  Besylate] Swelling   Feet swell        Medication List        Accurate as of May 15, 2023  1:48 PM. If you have any questions, ask your nurse or doctor.          acetaminophen  500 MG tablet Commonly known as: TYLENOL  Take 500 mg by mouth every 6 (six) hours as needed for moderate pain or headache.   ALPRAZolam  1 MG tablet Commonly known as: XANAX  Take 1 tablet (1 mg total) by mouth 3 (three) times daily as needed for anxiety.   aspirin  EC 81 MG tablet Take 81 mg by mouth daily.   Farxiga  10 MG Tabs tablet Generic drug: dapagliflozin  propanediol Take 1 tablet (10 mg total) by mouth  daily.   glimepiride  2 MG tablet Commonly known as: AMARYL  TAKE 1 BY MOUTH DAILY BEFORE SUPPER What changed:  how much to take how to take this when to take this additional instructions   hydrALAZINE  10 MG tablet Commonly known as: APRESOLINE  Take 1 tablet (10 mg total) by mouth 3 (three) times daily.   insulin  glargine (1 Unit Dial) 300 UNIT/ML Solostar Pen Commonly known as: TOUJEO  Inject 40 Units into the skin 2 (two) times daily.   lidocaine -prilocaine  cream Commonly known as: EMLA  Apply topically once. Apply a dime size to port-a-cath 1-2 hours prior to access. Cover with [employer].   metoprolol  succinate 25 MG 24 hr tablet Commonly known as: TOPROL -XL TAKE 1 TABLET BY MOUTH AT BEDTIME. GENERIC EQUIVALENT FOR TOPROL -XL   multivitamin with minerals tablet Take 1 tablet by mouth daily.   nitroGLYCERIN  0.4 MG SL tablet Commonly known as: NITROSTAT  Place 1 tablet (0.4 mg total) under the tongue every 5 (five) minutes as needed for chest pain. Repeat every 5 minute up to 3 doses if no relief call 911.   ondansetron  4 MG tablet Commonly known as: ZOFRAN  Take 1 tablet (4 mg total) by mouth every 8 (eight) hours  as needed.   OneTouch Delica Plus Lancet33G Misc Apply topically 3 (three) times daily.   OneTouch Verio test strip Generic drug: glucose blood 1 each 3 (three) times daily.   oxyCODONE  5 MG immediate release tablet Commonly known as: Oxy IR/ROXICODONE  Take 1 tablet (5 mg total) by mouth every 6 (six) to 8 (eight) hours as needed for pain.   Ozempic  (0.25 or 0.5 MG/DOSE) 2 MG/3ML Sopn Generic drug: Semaglutide (0.25 or 0.5MG /DOS) Inject 0.25 mg into the skin once a week. What changed:  how much to take additional instructions   pantoprazole  40 MG tablet Commonly known as: PROTONIX  Take 1 tablet (40 mg total) by mouth every morning.   polyethylene glycol 17 g packet Commonly known as: MIRALAX  / GLYCOLAX  Take 17 g by mouth daily.   PRESCRIPTION  MEDICATION Inhale into the lungs at bedtime. CPAP   PROBIOTIC PO Take 1 tablet by mouth daily.   ranolazine  500 MG 12 hr tablet Commonly known as: Ranexa  Take 1 tablet (500 mg total) by mouth 2 (two) times daily.   rOPINIRole  2 MG tablet Commonly known as: REQUIP  TAKE 1 TABLET BY MOUTH AT BEDTIME   rosuvastatin  10 MG tablet Commonly known as: CRESTOR  Take 1 tablet (10 mg total) by mouth daily.   tadalafil  20 MG tablet Commonly known as: CIALIS  Take 1 tablet (20 mg total) by mouth daily as needed. What changed: Another medication with the same name was removed. Continue taking this medication, and follow the directions you see here. Changed by: Maude JONELLE Crease        Allergies:  Allergies  Allergen Reactions   Nsaids Other (See Comments)    Has 1 kidney left   Norvasc  [Amlodipine  Besylate] Swelling    Feet swell    Past Medical History, Surgical history, Social history, and Family History were reviewed and updated.  Review of Systems: Review of Systems  Constitutional: Negative.   HENT: Negative.    Eyes: Negative.   Respiratory: Negative.    Cardiovascular: Negative.   Gastrointestinal: Negative.   Genitourinary: Negative.   Musculoskeletal:  Positive for back pain.  Skin: Negative.   Neurological:  Positive for headaches.  Endo/Heme/Allergies: Negative.   Psychiatric/Behavioral: Negative.       Physical Exam:  height is 5' 9 (1.753 m) and weight is 219 lb (99.3 kg).   Wt Readings from Last 3 Encounters:  05/15/23 219 lb (99.3 kg)  05/11/23 213 lb (96.6 kg)  05/04/23 215 lb (97.5 kg)    Physical Exam Vitals reviewed.  HENT:     Head: Normocephalic and atraumatic.  Eyes:     Pupils: Pupils are equal, round, and reactive to light.  Cardiovascular:     Rate and Rhythm: Normal rate and regular rhythm.     Heart sounds: Normal heart sounds.  Pulmonary:     Effort: Pulmonary effort is normal.     Breath sounds: Normal breath sounds.  Abdominal:      General: Bowel sounds are normal.     Palpations: Abdomen is soft.  Musculoskeletal:        General: No tenderness or deformity. Normal range of motion.     Cervical back: Normal range of motion.  Lymphadenopathy:     Cervical: No cervical adenopathy.  Skin:    General: Skin is warm and dry.     Findings: No erythema or rash.  Neurological:     Mental Status: He is alert and oriented to person, place, and time.  Psychiatric:  Behavior: Behavior normal.        Thought Content: Thought content normal.        Judgment: Judgment normal.     Lab Results  Component Value Date   WBC 4.4 05/15/2023   HGB 13.5 05/15/2023   HCT 38.4 (L) 05/15/2023   MCV 96.2 05/15/2023   PLT 101 (L) 05/15/2023   Lab Results  Component Value Date   FERRITIN 230 06/04/2021   IRON 129 06/04/2021   TIBC 329 06/04/2021   UIBC 200 06/04/2021   IRONPCTSAT 39 06/04/2021   Lab Results  Component Value Date   RETICCTPCT 2.0 06/04/2021   RBC 3.99 (L) 05/15/2023   No results found for: JONATHAN BONG [facility] Lab Results  Component Value Date   IGA 232 01/25/2015   No results found for: STEPHANY CARLOTA BENSON MARKEL EARLA JOANNIE DOC VICK, SPEI   Chemistry      Component Value Date/Time   NA 138 05/01/2023 0924   NA 139 09/03/2021 1456   NA 134 05/04/2017 0925   K 4.6 05/01/2023 0924   K 4.5 05/04/2017 0925   CL 101 05/01/2023 0924   CL 97 (L) 05/04/2017 0925   CO2 28 05/01/2023 0924   CO2 23 05/04/2017 0925   BUN 20 05/01/2023 0924   BUN 21 09/03/2021 1456   BUN 28 (H) 05/04/2017 0925   CREATININE 1.94 (H) 05/01/2023 0924   GLU 86 03/31/2016 0000      Component Value Date/Time   CALCIUM  9.5 05/01/2023 0924   CALCIUM  9.3 05/04/2017 0925   ALKPHOS 40 01/14/2023 1315   ALKPHOS 58 05/04/2017 0925   AST 22 05/01/2023 0924   AST 23 01/14/2023 1315   ALT 31 05/01/2023 0924   ALT 32 01/14/2023 1315   ALT 90 (H) 05/04/2017 0925    BILITOT 0.9 05/01/2023 0924   BILITOT 0.7 01/14/2023 1315       Impression and Plan: Mr. Helvey is a very pleasant 68 yo caucasian gentleman with infiltrating high grade papillary urothelial carcinoma with right nephrectomy in October 2018 and adjuvant chemotherapy.   So far, everything is done incredibly well with respect to the urothelial carcinoma.  It has now been 6 years since she had treatment.  I do not see any role for scans right now.  His labs look okay.  I am still awaiting the chemistries.  We will plan to get him back in another 4 months.  He still has a Port-A-Cath and so we will have to flushes every 2 months.     Maude JONELLE Crease, MD 1/3/20251:48 PM

## 2023-05-15 NOTE — Patient Instructions (Signed)

## 2023-05-15 NOTE — Telephone Encounter (Signed)
 Received PAP for Novo Nordisk and AZ&ME today waiting on Dr portion of application.will follow up.

## 2023-05-18 ENCOUNTER — Encounter: Payer: Self-pay | Admitting: Internal Medicine

## 2023-05-18 LAB — SURGICAL PATHOLOGY

## 2023-05-20 NOTE — Telephone Encounter (Signed)
 Fax doctor's portion for PAP Novo Nordisk,AZ&ME,[employer],pt mail all three application back,pt had said he will drop off at Dr office but mail then back.will follow up.

## 2023-05-22 NOTE — Telephone Encounter (Signed)
 Faxing back Dr potion of PAP,Dr fax back but no signature and medication was not fill in for pt application, will follow up.

## 2023-05-26 DIAGNOSIS — L720 Epidermal cyst: Secondary | ICD-10-CM | POA: Diagnosis not present

## 2023-05-26 DIAGNOSIS — L570 Actinic keratosis: Secondary | ICD-10-CM | POA: Diagnosis not present

## 2023-05-26 DIAGNOSIS — L723 Sebaceous cyst: Secondary | ICD-10-CM | POA: Diagnosis not present

## 2023-05-28 NOTE — Telephone Encounter (Signed)
Pt call back ,he explain he has been APPROVED on [employer]) and [employer] manager (AZ&ME) but not on [employer] they still have not received an PAP on Ozempic will submit appl .

## 2023-05-29 NOTE — Telephone Encounter (Signed)
Following up on pt PAP Ozempic [employer]), received Dr portion application,today and have fax entire application to [employer].will follow up.on there respond.

## 2023-06-09 NOTE — Telephone Encounter (Signed)
Received a call from pt regarding a letter he received from [employer] telling him he is missing imf on [employer] they explain he had not mark sec C or Sec D they need to have him check that part of application and on Dr portion pt was missing med imf. on application pt send it is mark and Dr part imf is showing on pap, Novo Nordisk ask to fax it again and it will take up to 72 hours for them to look at the pap to follow up with in this time.  Call pt back to let him know the process we are taking and will follow up after I speak with [employer].

## 2023-06-17 ENCOUNTER — Other Ambulatory Visit: Payer: Self-pay

## 2023-06-18 ENCOUNTER — Other Ambulatory Visit: Payer: Self-pay

## 2023-06-18 DIAGNOSIS — G2581 Restless legs syndrome: Secondary | ICD-10-CM

## 2023-06-18 DIAGNOSIS — K080 Exfoliation of teeth due to systemic causes: Secondary | ICD-10-CM | POA: Diagnosis not present

## 2023-06-18 MED ORDER — ROPINIROLE HCL 2 MG PO TABS: 2.0000 mg | ORAL_TABLET | Freq: Every day | ORAL | 3 refills | Status: AC

## 2023-06-18 NOTE — Telephone Encounter (Signed)
 Resending entired application to Novo Nordisk today and will follow up in a few days .

## 2023-06-22 NOTE — Telephone Encounter (Signed)
 Gave [employer]isk a call following up on pt pap Novo Nordisk application on Ozempic  pt has been APPROVED for 2025 and will be mailing out to Dr office with in 1014 days. Gave pt a call and is aware of mail and pick up from Dr. Clare Critchley.

## 2023-06-30 ENCOUNTER — Encounter: Payer: Self-pay | Admitting: Family

## 2023-06-30 ENCOUNTER — Other Ambulatory Visit (HOSPITAL_BASED_OUTPATIENT_CLINIC_OR_DEPARTMENT_OTHER): Payer: Self-pay

## 2023-06-30 MED ORDER — LIDOCAINE VISCOUS HCL 2 % MT SOLN
15.0000 mL | Freq: Three times a day (TID) | OROMUCOSAL | 2 refills | Status: AC | PRN
  Filled 2023-06-30: qty 474, 11d supply, fill #0

## 2023-07-01 ENCOUNTER — Other Ambulatory Visit (HOSPITAL_BASED_OUTPATIENT_CLINIC_OR_DEPARTMENT_OTHER): Payer: Self-pay

## 2023-07-02 ENCOUNTER — Encounter: Payer: Self-pay | Admitting: Family

## 2023-07-02 ENCOUNTER — Other Ambulatory Visit (HOSPITAL_BASED_OUTPATIENT_CLINIC_OR_DEPARTMENT_OTHER): Payer: Self-pay

## 2023-07-03 ENCOUNTER — Other Ambulatory Visit (HOSPITAL_BASED_OUTPATIENT_CLINIC_OR_DEPARTMENT_OTHER): Payer: Self-pay

## 2023-07-06 DIAGNOSIS — Z1330 Encounter for screening examination for mental health and behavioral disorders, unspecified: Secondary | ICD-10-CM | POA: Diagnosis not present

## 2023-07-06 DIAGNOSIS — Z794 Long term (current) use of insulin: Secondary | ICD-10-CM | POA: Diagnosis not present

## 2023-07-06 DIAGNOSIS — N1832 Chronic kidney disease, stage 3b: Secondary | ICD-10-CM | POA: Diagnosis not present

## 2023-07-06 DIAGNOSIS — E1122 Type 2 diabetes mellitus with diabetic chronic kidney disease: Secondary | ICD-10-CM | POA: Diagnosis not present

## 2023-07-06 DIAGNOSIS — E1142 Type 2 diabetes mellitus with diabetic polyneuropathy: Secondary | ICD-10-CM | POA: Diagnosis not present

## 2023-07-06 DIAGNOSIS — E119 Type 2 diabetes mellitus without complications: Secondary | ICD-10-CM | POA: Diagnosis not present

## 2023-07-16 ENCOUNTER — Inpatient Hospital Stay: Payer: Medicare Other | Attending: Hematology & Oncology

## 2023-07-16 VITALS — BP 150/88 | HR 78 | Temp 98.6°F | Resp 19

## 2023-07-16 DIAGNOSIS — Z452 Encounter for adjustment and management of vascular access device: Secondary | ICD-10-CM | POA: Insufficient documentation

## 2023-07-16 DIAGNOSIS — Z8551 Personal history of malignant neoplasm of bladder: Secondary | ICD-10-CM | POA: Diagnosis not present

## 2023-07-16 DIAGNOSIS — C651 Malignant neoplasm of right renal pelvis: Secondary | ICD-10-CM

## 2023-07-16 MED ORDER — SODIUM CHLORIDE 0.9% FLUSH
10.0000 mL | INTRAVENOUS | Status: DC | PRN
Start: 2023-07-16 — End: 2023-07-16
  Administered 2023-07-16: 10 mL via INTRAVENOUS

## 2023-07-16 MED ORDER — HEPARIN SOD (PORK) LOCK FLUSH 100 UNIT/ML IV SOLN
500.0000 [IU] | Freq: Once | INTRAVENOUS | Status: AC
  Administered 2023-07-16: 500 [IU] via INTRAVENOUS

## 2023-07-16 NOTE — Patient Instructions (Signed)

## 2023-07-20 ENCOUNTER — Other Ambulatory Visit: Payer: Self-pay

## 2023-07-20 MED ORDER — HYDRALAZINE HCL 10 MG PO TABS: 10.0000 mg | ORAL_TABLET | Freq: Three times a day (TID) | ORAL | 1 refills | Status: DC

## 2023-07-28 NOTE — Telephone Encounter (Signed)
 Received a call from [name] today pt said he had not Received Ozempic from Dr office gave [employer] a call they explain they have mail out on 07/09/23 at 9:39 AM,pt had not received a call from Dr office gave Dr office a call prescription is at Dr office ,pt will be pick up soon from office.

## 2023-07-29 DIAGNOSIS — N183 Chronic kidney disease, stage 3 unspecified: Secondary | ICD-10-CM | POA: Diagnosis not present

## 2023-07-29 DIAGNOSIS — D631 Anemia in chronic kidney disease: Secondary | ICD-10-CM | POA: Diagnosis not present

## 2023-07-29 DIAGNOSIS — I129 Hypertensive chronic kidney disease with stage 1 through stage 4 chronic kidney disease, or unspecified chronic kidney disease: Secondary | ICD-10-CM | POA: Diagnosis not present

## 2023-07-29 DIAGNOSIS — R809 Proteinuria, unspecified: Secondary | ICD-10-CM | POA: Diagnosis not present

## 2023-07-30 LAB — LAB REPORT - SCANNED
Albumin, Urine POC: 7.1
Albumin/Creatinine Ratio, Urine, POC: 46
Creatinine, POC: 15.5 mg/dL

## 2023-08-04 ENCOUNTER — Telehealth: Payer: Self-pay | Admitting: Cardiology

## 2023-08-04 NOTE — Telephone Encounter (Signed)
 What problem are you experiencing? NA  Who is your medical equipment company? NA  3)    If patient is calling about their sleep study results please route to [facility] Sleep Study Pool. Pt calling to get sleep results  Please route to the sleep study coordinator.

## 2023-08-10 NOTE — Telephone Encounter (Signed)
**Note De-Identified Alegandro Macnaughton Obfuscation** Per the [employer] Provider Portal: The following solutions for the service date entered do not require Pre-Authorization by [employer]: CPT Code: 95811-CPAP Titration.  I have made the pt aware of this approval and that I have transferred the CPAP Titration order to the Sleep lab so they can contact the pt to schedule the test.

## 2023-08-13 DIAGNOSIS — B351 Tinea unguium: Secondary | ICD-10-CM | POA: Diagnosis not present

## 2023-08-13 DIAGNOSIS — E1142 Type 2 diabetes mellitus with diabetic polyneuropathy: Secondary | ICD-10-CM | POA: Diagnosis not present

## 2023-08-17 DIAGNOSIS — G4733 Obstructive sleep apnea (adult) (pediatric): Secondary | ICD-10-CM | POA: Diagnosis not present

## 2023-09-09 ENCOUNTER — Other Ambulatory Visit (HOSPITAL_BASED_OUTPATIENT_CLINIC_OR_DEPARTMENT_OTHER): Payer: Self-pay

## 2023-09-09 ENCOUNTER — Encounter: Payer: Self-pay | Admitting: Family

## 2023-09-11 ENCOUNTER — Encounter: Payer: Self-pay | Admitting: Hematology & Oncology

## 2023-09-11 ENCOUNTER — Inpatient Hospital Stay: Payer: Medicare Other | Attending: Hematology & Oncology

## 2023-09-11 ENCOUNTER — Inpatient Hospital Stay: Payer: Medicare Other

## 2023-09-11 ENCOUNTER — Inpatient Hospital Stay (HOSPITAL_BASED_OUTPATIENT_CLINIC_OR_DEPARTMENT_OTHER): Payer: Medicare Other | Admitting: Hematology & Oncology

## 2023-09-11 ENCOUNTER — Other Ambulatory Visit: Payer: Self-pay

## 2023-09-11 VITALS — BP 131/77 | HR 61 | Temp 98.3°F | Resp 18 | Ht 69.0 in | Wt 217.0 lb

## 2023-09-11 DIAGNOSIS — Z452 Encounter for adjustment and management of vascular access device: Secondary | ICD-10-CM | POA: Diagnosis not present

## 2023-09-11 DIAGNOSIS — Z8551 Personal history of malignant neoplasm of bladder: Secondary | ICD-10-CM | POA: Diagnosis not present

## 2023-09-11 DIAGNOSIS — C649 Malignant neoplasm of unspecified kidney, except renal pelvis: Secondary | ICD-10-CM

## 2023-09-11 DIAGNOSIS — C651 Malignant neoplasm of right renal pelvis: Secondary | ICD-10-CM

## 2023-09-11 DIAGNOSIS — Z905 Acquired absence of kidney: Secondary | ICD-10-CM | POA: Insufficient documentation

## 2023-09-11 LAB — CMP (CANCER CENTER ONLY)
ALT: 26 U/L (ref 0–44)
AST: 18 U/L (ref 15–41)
Albumin: 4.2 g/dL (ref 3.5–5.0)
Alkaline Phosphatase: 47 U/L (ref 38–126)
Anion gap: 7 (ref 5–15)
BUN: 27 mg/dL — ABNORMAL HIGH (ref 8–23)
CO2: 27 mmol/L (ref 22–32)
Calcium: 9.2 mg/dL (ref 8.9–10.3)
Chloride: 103 mmol/L (ref 98–111)
Creatinine: 1.88 mg/dL — ABNORMAL HIGH (ref 0.61–1.24)
GFR, Estimated: 38 mL/min — ABNORMAL LOW (ref >60)
Glucose, Bld: 204 mg/dL — ABNORMAL HIGH (ref 70–99)
Potassium: 4.2 mmol/L (ref 3.5–5.1)
Sodium: 137 mmol/L (ref 135–145)
Total Bilirubin: 0.5 mg/dL (ref 0.0–1.2)
Total Protein: 6.3 g/dL — ABNORMAL LOW (ref 6.5–8.1)

## 2023-09-11 LAB — CBC WITH DIFFERENTIAL (CANCER CENTER ONLY)
Abs Immature Granulocytes: 0.02 10*3/uL (ref 0.00–0.07)
Basophils Absolute: 0 10*3/uL (ref 0.0–0.1)
Basophils Relative: 1 %
Eosinophils Absolute: 0.1 10*3/uL (ref 0.0–0.5)
Eosinophils Relative: 2 %
HCT: 40.2 % (ref 39.0–52.0)
Hemoglobin: 14 g/dL (ref 13.0–17.0)
Immature Granulocytes: 1 %
Lymphocytes Relative: 32 %
Lymphs Abs: 1.2 10*3/uL (ref 0.7–4.0)
MCH: 34.5 pg — ABNORMAL HIGH (ref 26.0–34.0)
MCHC: 34.8 g/dL (ref 30.0–36.0)
MCV: 99 fL (ref 80.0–100.0)
Monocytes Absolute: 0.4 10*3/uL (ref 0.1–1.0)
Monocytes Relative: 10 %
Neutro Abs: 2.2 10*3/uL (ref 1.7–7.7)
Neutrophils Relative %: 54 %
Platelet Count: 104 10*3/uL — ABNORMAL LOW (ref 150–400)
RBC: 4.06 MIL/uL — ABNORMAL LOW (ref 4.22–5.81)
RDW: 13.1 % (ref 11.5–15.5)
WBC Count: 3.9 10*3/uL — ABNORMAL LOW (ref 4.0–10.5)
nRBC: 0 % (ref 0.0–0.2)

## 2023-09-11 MED ORDER — HEPARIN SOD (PORK) LOCK FLUSH 100 UNIT/ML IV SOLN
500.0000 [IU] | Freq: Once | INTRAVENOUS | Status: AC
  Administered 2023-09-11: 500 [IU] via INTRAVENOUS

## 2023-09-11 MED ORDER — SODIUM CHLORIDE 0.9% FLUSH
10.0000 mL | Freq: Once | INTRAVENOUS | Status: AC
  Administered 2023-09-11: 10 mL via INTRAVENOUS

## 2023-09-11 NOTE — Progress Notes (Signed)
 Hematology and Oncology Follow Up Visit  Isaac [name] [id number] [birth year] 68 y.o. 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  We saw him back in January.  Since then, he has been doing pretty well.  He has had no complaints.  He has had no problems with his diabetes.  His blood sugars do tend to trend up and down.  He has had no cough.  He has had no nausea or vomiting.  There is been no change in bowel bladder habits.  He has had no leg swelling.  He has had some neuropathy which is chronic.  He has had no fever.  Thankfully, he has avoided influenza and COVID.  He has had no headache.    He continues on Ozempic .  He seems to be doing pretty well with this.  Overall, I would say his performance status is probably ECOG 1.    Medications:  Allergies as of 09/11/2023       Reactions   Nsaids Other (See Comments)   Has 1 kidney left   Norvasc  [amlodipine  Besylate] Swelling   Feet swell        Medication List        Accurate as of Sep 11, 2023  2:06 PM. If you have any questions, ask your nurse or doctor.          acetaminophen  500 MG tablet Commonly known as: TYLENOL  Take 500 mg by mouth every 6 (six) hours as needed for moderate pain or headache.   ALPRAZolam  1 MG tablet Commonly known as: XANAX  Take 1 tablet (1 mg total) by mouth 3 (three) times daily as needed for anxiety.   aspirin  EC 81 MG tablet Take 81 mg by mouth daily.   Farxiga  10 MG Tabs tablet Generic drug: dapagliflozin  propanediol Take 1 tablet (10 mg total) by mouth daily.   glimepiride  2 MG tablet Commonly known as: AMARYL  TAKE 1 BY MOUTH DAILY BEFORE SUPPER What changed:  how much  to take how to take this when to take this additional instructions   hydrALAZINE  10 MG tablet Commonly known as: APRESOLINE  Take 1 tablet (10 mg total) by mouth 3 (three) times daily.   insulin  glargine (1 Unit Dial) 300 UNIT/ML Solostar Pen Commonly known as: TOUJEO  Inject 40 Units into the skin 2 (two) times daily.   lidocaine -prilocaine  cream Commonly known as: EMLA  Apply topically once. Apply a dime size to port-a-cath 1-2 hours prior to access. Cover with [employer].   magic mouthwash (lidocaine , diphenhydrAMINE , alum & mag hydroxide) suspension Rinse mouth with 15 mLs and expectorate 3 (three) times daily as needed.   metoprolol  succinate 25 MG 24 hr tablet Commonly known as: TOPROL -XL TAKE 1 TABLET BY MOUTH AT BEDTIME. GENERIC EQUIVALENT FOR TOPROL -XL   multivitamin with minerals tablet Take 1 tablet by mouth daily.   nitroGLYCERIN  0.4 MG SL tablet Commonly known as: NITROSTAT  Place 1 tablet (0.4 mg total) under the tongue every 5 (five) minutes as needed for chest pain. Repeat every 5 minute up to 3 doses if no relief call 911.   ondansetron  4 MG tablet Commonly known as: ZOFRAN  Take 1 tablet (4 mg total) by mouth every  8 (eight) hours as needed.   OneTouch Delica Plus Lancet33G Misc Apply topically 3 (three) times daily.   OneTouch Verio test strip Generic drug: glucose blood 1 each 3 (three) times daily.   oxyCODONE  5 MG immediate release tablet Commonly known as: Oxy IR/ROXICODONE  Take 1 tablet (5 mg total) by mouth every 6 (six) to 8 (eight) hours as needed for pain.   Ozempic  (0.25 or 0.5 MG/DOSE) 2 MG/3ML Sopn Generic drug: Semaglutide (0.25 or 0.5MG /DOS) Inject 0.25 mg into the skin once a week. What changed:  how much to take additional instructions   pantoprazole  40 MG tablet Commonly known as: PROTONIX  Take 1 tablet (40 mg total) by mouth every morning.   polyethylene glycol 17 g packet Commonly known as: MIRALAX  / GLYCOLAX  Take 17 g by  mouth daily.   PRESCRIPTION MEDICATION Inhale into the lungs at bedtime. CPAP   PROBIOTIC PO Take 1 tablet by mouth daily.   ranolazine  500 MG 12 hr tablet Commonly known as: Ranexa  Take 1 tablet (500 mg total) by mouth 2 (two) times daily.   rOPINIRole  2 MG tablet Commonly known as: REQUIP  Take 1 tablet (2 mg total) by mouth at bedtime.   rosuvastatin  10 MG tablet Commonly known as: CRESTOR  Take 1 tablet (10 mg total) by mouth daily.   tadalafil  20 MG tablet Commonly known as: CIALIS  Take 1 tablet (20 mg total) by mouth daily as needed.        Allergies:  Allergies  Allergen Reactions   Nsaids Other (See Comments)    Has 1 kidney left   Norvasc  [Amlodipine  Besylate] Swelling    Feet swell    Past Medical History, Surgical history, Social history, and Family History were reviewed and updated.  Review of Systems: Review of Systems  Constitutional: Negative.   HENT: Negative.    Eyes: Negative.   Respiratory: Negative.    Cardiovascular: Negative.   Gastrointestinal: Negative.   Genitourinary: Negative.   Musculoskeletal:  Positive for back pain.  Skin: Negative.   Neurological:  Positive for headaches.  Endo/Heme/Allergies: Negative.   Psychiatric/Behavioral: Negative.       Physical Exam:  height is 5\' 9"  (1.753 m) and weight is 217 lb (98.4 kg). His oral temperature is 98.3 F (36.8 C). His blood pressure is 131/77 and his pulse is 61. His respiration is 18 and oxygen saturation is 98%.   Wt Readings from Last 3 Encounters:  09/11/23 217 lb (98.4 kg)  05/15/23 219 lb (99.3 kg)  05/11/23 213 lb (96.6 kg)    Physical Exam Vitals reviewed.  HENT:     Head: Normocephalic and atraumatic.  Eyes:     Pupils: Pupils are equal, round, and reactive to light.  Cardiovascular:     Rate and Rhythm: Normal rate and regular rhythm.     Heart sounds: Normal heart sounds.  Pulmonary:     Effort: Pulmonary effort is normal.     Breath sounds: Normal breath  sounds.  Abdominal:     General: Bowel sounds are normal.     Palpations: Abdomen is soft.  Musculoskeletal:        General: No tenderness or deformity. Normal range of motion.     Cervical back: Normal range of motion.  Lymphadenopathy:     Cervical: No cervical adenopathy.  Skin:    General: Skin is warm and dry.     Findings: No erythema or rash.  Neurological:     Mental Status: He is alert and oriented  to person, place, and time.  Psychiatric:        Behavior: Behavior normal.        Thought Content: Thought content normal.        Judgment: Judgment normal.     Lab Results  Component Value Date   WBC 3.9 (L) 09/11/2023   HGB 14.0 09/11/2023   HCT 40.2 09/11/2023   MCV 99.0 09/11/2023   PLT 104 (L) 09/11/2023   Lab Results  Component Value Date   FERRITIN 230 06/04/2021   IRON 129 06/04/2021   TIBC 329 06/04/2021   UIBC 200 06/04/2021   IRONPCTSAT 39 06/04/2021   Lab Results  Component Value Date   RETICCTPCT 2.0 06/04/2021   RBC 4.06 (L) 09/11/2023   No results found for: "KPAFRELGTCHN", "LAMBDASER", "KAPLAMBRATIO" Lab Results  Component Value Date   IGA 232 01/25/2015   No results found for: "TOTALPROTELP", "ALBUMINELP", "A1GS", "A2GS", "BETS", "BETA2SER", "GAMS", "MSPIKE", "SPEI"   Chemistry      Component Value Date/Time   NA 136 05/15/2023 1325   NA 139 09/03/2021 1456   NA 134 05/04/2017 0925   K 3.8 05/15/2023 1325   K 4.5 05/04/2017 0925   CL 102 05/15/2023 1325   CL 97 (L) 05/04/2017 0925   CO2 26 05/15/2023 1325   CO2 23 05/04/2017 0925   BUN 26 (H) 05/15/2023 1325   BUN 21 09/03/2021 1456   BUN 28 (H) 05/04/2017 0925   CREATININE 1.94 (H) 05/15/2023 1325   CREATININE 1.94 (H) 05/01/2023 0924   GLU 86 03/31/2016 0000      Component Value Date/Time   CALCIUM  8.9 05/15/2023 1325   CALCIUM  9.3 05/04/2017 0925   ALKPHOS 45 05/15/2023 1325   ALKPHOS 58 05/04/2017 0925   AST 22 05/15/2023 1325   ALT 27 05/15/2023 1325   ALT 90 (H)  05/04/2017 0925   BILITOT 0.8 05/15/2023 1325       Impression and Plan: Mr. Artiaga is a very pleasant 68 yo caucasian gentleman with infiltrating high grade papillary urothelial carcinoma with right nephrectomy in October 2018 and adjuvant chemotherapy.   So far, everything is done incredibly well with respect to the urothelial carcinoma.  It has now been over 6 years since he had treatment.  I do not see any role for scans right now.  His labs look okay.  I am still awaiting the chemistries.  We will plan to get him back in another 4 months.  He still has a Port-A-Cath and so we will have to flushes every 2 months.     Ivor Mars, MD 5/2/20252:06 PM

## 2023-09-11 NOTE — Patient Instructions (Signed)

## 2023-09-29 ENCOUNTER — Other Ambulatory Visit: Payer: Self-pay

## 2023-09-29 ENCOUNTER — Other Ambulatory Visit (HOSPITAL_BASED_OUTPATIENT_CLINIC_OR_DEPARTMENT_OTHER): Payer: Self-pay

## 2023-09-29 MED ORDER — DAPAGLIFLOZIN PROPANEDIOL 10 MG PO TABS
10.0000 mg | ORAL_TABLET | Freq: Every day | ORAL | 6 refills | Status: AC
  Filled 2023-09-29: qty 30, 30d supply, fill #0

## 2023-09-30 ENCOUNTER — Other Ambulatory Visit (HOSPITAL_BASED_OUTPATIENT_CLINIC_OR_DEPARTMENT_OTHER): Payer: Self-pay

## 2023-10-19 ENCOUNTER — Encounter: Payer: Self-pay | Admitting: Family

## 2023-10-19 ENCOUNTER — Other Ambulatory Visit (HOSPITAL_BASED_OUTPATIENT_CLINIC_OR_DEPARTMENT_OTHER): Payer: Self-pay

## 2023-10-21 ENCOUNTER — Other Ambulatory Visit (HOSPITAL_BASED_OUTPATIENT_CLINIC_OR_DEPARTMENT_OTHER): Payer: Self-pay

## 2023-10-21 ENCOUNTER — Other Ambulatory Visit: Payer: Self-pay | Admitting: *Deleted

## 2023-10-21 ENCOUNTER — Inpatient Hospital Stay: Attending: Hematology & Oncology

## 2023-10-21 VITALS — BP 151/87 | HR 72 | Temp 98.9°F | Resp 18

## 2023-10-21 DIAGNOSIS — Z452 Encounter for adjustment and management of vascular access device: Secondary | ICD-10-CM | POA: Insufficient documentation

## 2023-10-21 DIAGNOSIS — C651 Malignant neoplasm of right renal pelvis: Secondary | ICD-10-CM

## 2023-10-21 DIAGNOSIS — Z8551 Personal history of malignant neoplasm of bladder: Secondary | ICD-10-CM | POA: Diagnosis not present

## 2023-10-21 DIAGNOSIS — Z95828 Presence of other vascular implants and grafts: Secondary | ICD-10-CM

## 2023-10-21 MED ORDER — AMOXICILLIN 500 MG PO CAPS
ORAL_CAPSULE | ORAL | 0 refills | Status: DC
  Filled 2023-10-21: qty 8, 2d supply, fill #0

## 2023-10-21 MED ORDER — AMOXICILLIN 500 MG PO CAPS
2000.0000 mg | ORAL_CAPSULE | ORAL | 0 refills | Status: AC | PRN
  Filled 2023-10-21: qty 8, 2d supply, fill #0

## 2023-10-21 MED ORDER — SODIUM CHLORIDE 0.9% FLUSH
10.0000 mL | INTRAVENOUS | Status: DC | PRN
  Administered 2023-10-21: 10 mL via INTRAVENOUS

## 2023-10-21 MED ORDER — HEPARIN SOD (PORK) LOCK FLUSH 100 UNIT/ML IV SOLN
500.0000 [IU] | Freq: Once | INTRAVENOUS | Status: AC
  Administered 2023-10-21: 500 [IU] via INTRAVENOUS

## 2023-10-21 NOTE — Patient Instructions (Signed)

## 2023-10-27 DIAGNOSIS — K080 Exfoliation of teeth due to systemic causes: Secondary | ICD-10-CM | POA: Diagnosis not present

## 2023-10-28 DIAGNOSIS — L570 Actinic keratosis: Secondary | ICD-10-CM | POA: Diagnosis not present

## 2023-10-28 DIAGNOSIS — C44629 Squamous cell carcinoma of skin of left upper limb, including shoulder: Secondary | ICD-10-CM | POA: Diagnosis not present

## 2023-10-29 DIAGNOSIS — I25118 Atherosclerotic heart disease of native coronary artery with other forms of angina pectoris: Secondary | ICD-10-CM | POA: Diagnosis not present

## 2023-10-29 DIAGNOSIS — G8929 Other chronic pain: Secondary | ICD-10-CM | POA: Diagnosis not present

## 2023-11-02 DIAGNOSIS — M21611 Bunion of right foot: Secondary | ICD-10-CM | POA: Diagnosis not present

## 2023-11-02 DIAGNOSIS — M21612 Bunion of left foot: Secondary | ICD-10-CM | POA: Diagnosis not present

## 2023-11-02 DIAGNOSIS — E1142 Type 2 diabetes mellitus with diabetic polyneuropathy: Secondary | ICD-10-CM | POA: Diagnosis not present

## 2023-11-03 DIAGNOSIS — K080 Exfoliation of teeth due to systemic causes: Secondary | ICD-10-CM | POA: Diagnosis not present

## 2023-11-04 ENCOUNTER — Other Ambulatory Visit (HOSPITAL_BASED_OUTPATIENT_CLINIC_OR_DEPARTMENT_OTHER): Payer: Self-pay

## 2023-11-04 DIAGNOSIS — K080 Exfoliation of teeth due to systemic causes: Secondary | ICD-10-CM | POA: Diagnosis not present

## 2023-11-04 MED ORDER — AMOXICILLIN 500 MG PO CAPS
500.0000 mg | ORAL_CAPSULE | Freq: Three times a day (TID) | ORAL | 0 refills | Status: AC
  Filled 2023-11-04: qty 21, 7d supply, fill #0

## 2023-11-12 DIAGNOSIS — B351 Tinea unguium: Secondary | ICD-10-CM | POA: Diagnosis not present

## 2023-11-12 DIAGNOSIS — M21622 Bunionette of left foot: Secondary | ICD-10-CM | POA: Diagnosis not present

## 2023-11-12 DIAGNOSIS — M21621 Bunionette of right foot: Secondary | ICD-10-CM | POA: Diagnosis not present

## 2023-11-12 DIAGNOSIS — E1142 Type 2 diabetes mellitus with diabetic polyneuropathy: Secondary | ICD-10-CM | POA: Diagnosis not present

## 2023-12-02 ENCOUNTER — Inpatient Hospital Stay: Attending: Hematology & Oncology

## 2023-12-02 VITALS — BP 120/72 | HR 58 | Temp 98.4°F | Resp 18

## 2023-12-02 DIAGNOSIS — Z95828 Presence of other vascular implants and grafts: Secondary | ICD-10-CM

## 2023-12-02 DIAGNOSIS — Z8551 Personal history of malignant neoplasm of bladder: Secondary | ICD-10-CM | POA: Insufficient documentation

## 2023-12-02 DIAGNOSIS — Z452 Encounter for adjustment and management of vascular access device: Secondary | ICD-10-CM | POA: Insufficient documentation

## 2023-12-02 MED ORDER — SODIUM CHLORIDE 0.9% FLUSH
10.0000 mL | Freq: Once | INTRAVENOUS | Status: AC
  Administered 2023-12-02: 10 mL via INTRAVENOUS

## 2023-12-02 MED ORDER — HEPARIN SOD (PORK) LOCK FLUSH 100 UNIT/ML IV SOLN
500.0000 [IU] | Freq: Once | INTRAVENOUS | Status: AC
  Administered 2023-12-02: 500 [IU] via INTRAVENOUS

## 2023-12-02 NOTE — Patient Instructions (Signed)

## 2023-12-15 ENCOUNTER — Telehealth: Payer: Self-pay | Admitting: Internal Medicine

## 2023-12-15 ENCOUNTER — Other Ambulatory Visit: Payer: Self-pay

## 2023-12-15 MED ORDER — METOPROLOL SUCCINATE ER 25 MG PO TB24: ORAL_TABLET | ORAL | 0 refills | Status: DC

## 2023-12-15 MED ORDER — PANTOPRAZOLE SODIUM 40 MG PO TBEC: 40.0000 mg | DELAYED_RELEASE_TABLET | Freq: Every morning | ORAL | 2 refills | Status: AC

## 2023-12-15 NOTE — Telephone Encounter (Signed)
*  STAT* If patient is at the pharmacy, call can be transferred to refill team.   1. Which medications need to be refilled? (please list name of each medication and dose if known)   hydrALAZINE  (APRESOLINE ) 10 MG tablet    metoprolol  succinate (TOPROL -XL) 25 MG 24 hr tablet    pantoprazole  (PROTONIX ) 40 MG tablet    rosuvastatin  (CRESTOR ) 10 MG tablet   2. Which pharmacy/location (including street and city if local pharmacy) is medication to be sent to?  [facility] - [place], [place] - [address]      3. Do they need a 30 day or 90 day supply? 90 day

## 2023-12-16 MED ORDER — ROSUVASTATIN CALCIUM 10 MG PO TABS: 10.0000 mg | ORAL_TABLET | Freq: Every day | ORAL | 0 refills | Status: DC

## 2023-12-16 MED ORDER — HYDRALAZINE HCL 10 MG PO TABS: 10.0000 mg | ORAL_TABLET | Freq: Three times a day (TID) | ORAL | 0 refills | Status: DC

## 2023-12-16 NOTE — Telephone Encounter (Signed)
 RX sent in

## 2023-12-22 ENCOUNTER — Other Ambulatory Visit (HOSPITAL_BASED_OUTPATIENT_CLINIC_OR_DEPARTMENT_OTHER): Payer: Self-pay

## 2023-12-22 ENCOUNTER — Encounter: Payer: Self-pay | Admitting: Family

## 2023-12-23 DIAGNOSIS — M79672 Pain in left foot: Secondary | ICD-10-CM | POA: Diagnosis not present

## 2023-12-23 DIAGNOSIS — G894 Chronic pain syndrome: Secondary | ICD-10-CM | POA: Diagnosis not present

## 2023-12-23 DIAGNOSIS — M79671 Pain in right foot: Secondary | ICD-10-CM | POA: Diagnosis not present

## 2023-12-23 DIAGNOSIS — E1142 Type 2 diabetes mellitus with diabetic polyneuropathy: Secondary | ICD-10-CM | POA: Diagnosis not present

## 2023-12-31 ENCOUNTER — Telehealth: Payer: Self-pay

## 2023-12-31 NOTE — Telephone Encounter (Signed)
 Received refill reorder request form from [facility] ,fill and faxed to provider office to sing and date,can be fax to [facility] or fax it back to [phone].

## 2024-01-04 DIAGNOSIS — E1122 Type 2 diabetes mellitus with diabetic chronic kidney disease: Secondary | ICD-10-CM | POA: Diagnosis not present

## 2024-01-04 DIAGNOSIS — E1142 Type 2 diabetes mellitus with diabetic polyneuropathy: Secondary | ICD-10-CM | POA: Diagnosis not present

## 2024-01-04 DIAGNOSIS — Z794 Long term (current) use of insulin: Secondary | ICD-10-CM | POA: Diagnosis not present

## 2024-01-04 DIAGNOSIS — N1832 Chronic kidney disease, stage 3b: Secondary | ICD-10-CM | POA: Diagnosis not present

## 2024-01-18 ENCOUNTER — Encounter: Payer: Self-pay | Admitting: Family

## 2024-01-18 ENCOUNTER — Other Ambulatory Visit (HOSPITAL_BASED_OUTPATIENT_CLINIC_OR_DEPARTMENT_OTHER): Payer: Self-pay

## 2024-01-20 ENCOUNTER — Other Ambulatory Visit (HOSPITAL_BASED_OUTPATIENT_CLINIC_OR_DEPARTMENT_OTHER): Payer: Self-pay

## 2024-01-20 ENCOUNTER — Encounter: Payer: Self-pay | Admitting: Hematology & Oncology

## 2024-01-20 ENCOUNTER — Inpatient Hospital Stay

## 2024-01-20 ENCOUNTER — Inpatient Hospital Stay: Attending: Hematology & Oncology

## 2024-01-20 ENCOUNTER — Inpatient Hospital Stay (HOSPITAL_BASED_OUTPATIENT_CLINIC_OR_DEPARTMENT_OTHER): Admitting: Hematology & Oncology

## 2024-01-20 DIAGNOSIS — Z905 Acquired absence of kidney: Secondary | ICD-10-CM | POA: Insufficient documentation

## 2024-01-20 DIAGNOSIS — C649 Malignant neoplasm of unspecified kidney, except renal pelvis: Secondary | ICD-10-CM | POA: Diagnosis not present

## 2024-01-20 DIAGNOSIS — F419 Anxiety disorder, unspecified: Secondary | ICD-10-CM

## 2024-01-20 DIAGNOSIS — Z8551 Personal history of malignant neoplasm of bladder: Secondary | ICD-10-CM | POA: Insufficient documentation

## 2024-01-20 DIAGNOSIS — C651 Malignant neoplasm of right renal pelvis: Secondary | ICD-10-CM

## 2024-01-20 LAB — CBC WITH DIFFERENTIAL (CANCER CENTER ONLY)
Abs Immature Granulocytes: 0.03 K/uL (ref 0.00–0.07)
Basophils Absolute: 0 K/uL (ref 0.0–0.1)
Basophils Relative: 1 %
Eosinophils Absolute: 0.1 K/uL (ref 0.0–0.5)
Eosinophils Relative: 2 %
HCT: 40.5 % (ref 39.0–52.0)
Hemoglobin: 13.9 g/dL (ref 13.0–17.0)
Immature Granulocytes: 1 %
Lymphocytes Relative: 31 %
Lymphs Abs: 1.1 K/uL (ref 0.7–4.0)
MCH: 33.9 pg (ref 26.0–34.0)
MCHC: 34.3 g/dL (ref 30.0–36.0)
MCV: 98.8 fL (ref 80.0–100.0)
Monocytes Absolute: 0.3 K/uL (ref 0.1–1.0)
Monocytes Relative: 9 %
Neutro Abs: 2 K/uL (ref 1.7–7.7)
Neutrophils Relative %: 56 %
Platelet Count: 101 K/uL — ABNORMAL LOW (ref 150–400)
RBC: 4.1 MIL/uL — ABNORMAL LOW (ref 4.22–5.81)
RDW: 13 % (ref 11.5–15.5)
WBC Count: 3.6 K/uL — ABNORMAL LOW (ref 4.0–10.5)
nRBC: 0 % (ref 0.0–0.2)

## 2024-01-20 LAB — CMP (CANCER CENTER ONLY)
ALT: 43 U/L (ref 0–44)
AST: 33 U/L (ref 15–41)
Albumin: 3.9 g/dL (ref 3.5–5.0)
Alkaline Phosphatase: 46 U/L (ref 38–126)
Anion gap: 10 (ref 5–15)
BUN: 23 mg/dL (ref 8–23)
CO2: 23 mmol/L (ref 22–32)
Calcium: 8.3 mg/dL — ABNORMAL LOW (ref 8.9–10.3)
Chloride: 106 mmol/L (ref 98–111)
Creatinine: 1.59 mg/dL — ABNORMAL HIGH (ref 0.61–1.24)
GFR, Estimated: 47 mL/min — ABNORMAL LOW (ref >60)
Glucose, Bld: 106 mg/dL — ABNORMAL HIGH (ref 70–99)
Potassium: 4 mmol/L (ref 3.5–5.1)
Sodium: 139 mmol/L (ref 135–145)
Total Bilirubin: 0.6 mg/dL (ref 0.0–1.2)
Total Protein: 6 g/dL — ABNORMAL LOW (ref 6.5–8.1)

## 2024-01-20 LAB — LACTATE DEHYDROGENASE: LDH: 150 U/L (ref 98–192)

## 2024-01-20 MED ORDER — ALPRAZOLAM 1 MG PO TABS
1.0000 mg | ORAL_TABLET | Freq: Three times a day (TID) | ORAL | 0 refills | Status: AC | PRN
  Filled 2024-01-20: qty 30, 10d supply, fill #0

## 2024-01-20 NOTE — Progress Notes (Signed)
 Hematology and Oncology Follow Up Visit  Isaac [name] [id number] [birth year] 68 y.o. 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  -We last saw him back in May.  Since then, he has been doing pretty well.  He has been staying around the house.  He really has not traveled.SABRA  He continues on Ozempic .  His weight is doing okay.  His weight is holding steady.  He does have neuropathy.  This is probably combination of his chemotherapy and diabetes.  It sounds like he is going to have a implantable stimulator.  I think he sees the pain specialist for this.  Hopefully, he will be able to get this put in.  I am sure that this will help with the neuropathy.  He has had no issues with nausea or vomiting.  He has had no cough or shortness of breath.  He has had little bit of leg swelling.  His blood sugars are doing better.  Thankfully, he has had no problems with COVID.  There is been no bleeding.  Has had no change in bowel or bladder habits.  Overall, I would have to say that his performance status is probably ECOG 1.    Medications:  Allergies as of 01/20/2024       Reactions   Nsaids Other (See Comments)   Has 1 kidney left   Norvasc  [amlodipine  Besylate] Swelling   Feet swell        Medication List        Accurate as of January 20, 2024  1:34 PM. If you have any questions, ask your nurse or doctor.          acetaminophen  500 MG tablet Commonly known as: TYLENOL  Take 500 mg by mouth every 6 (six) hours as needed for moderate pain or headache.   ALPRAZolam  1 MG tablet Commonly known as: XANAX  Take 1 tablet (1 mg total) by mouth 3 (three) times daily as needed  for anxiety.   amoxicillin  500 MG capsule Commonly known as: AMOXIL  Take four capsules (2000 mg) by mouth one hour prior to dental appointment.   aspirin  EC 81 MG tablet Take 81 mg by mouth daily.   dapagliflozin  propanediol 10 MG Tabs tablet Commonly known as: FARXIGA  Take 1 tablet (10 mg total) by mouth daily.   Embecta Pen Needle Nano 2 Gen 32G X 4 MM Misc Generic drug: Insulin  Pen Needle 2 (two) times daily.   glimepiride  2 MG tablet Commonly known as: AMARYL  TAKE 1 BY MOUTH DAILY BEFORE SUPPER What changed:  how much to take how to take this when to take this additional instructions   hydrALAZINE  10 MG tablet Commonly known as: APRESOLINE  Take 1 tablet (10 mg total) by mouth 3 (three) times daily.   insulin  glargine (1 Unit Dial) 300 UNIT/ML Solostar Pen Commonly known as: TOUJEO  Inject 40 Units into the skin 2 (two) times daily.   lidocaine -prilocaine  cream Commonly known as: EMLA  Apply topically once. Apply a dime size to port-a-cath 1-2 hours prior to access. Cover with [employer].   magic mouthwash (lidocaine , diphenhydrAMINE , alum & mag hydroxide) suspension Rinse mouth with 15 mLs and expectorate 3 (three) times daily as  needed.   metoprolol  succinate 25 MG 24 hr tablet Commonly known as: TOPROL -XL TAKE 1 TABLET BY MOUTH AT BEDTIME. GENERIC EQUIVALENT FOR TOPROL -XL   multivitamin with minerals tablet Take 1 tablet by mouth daily.   nitroGLYCERIN  0.4 MG SL tablet Commonly known as: NITROSTAT  Place 1 tablet (0.4 mg total) under the tongue every 5 (five) minutes as needed for chest pain. Repeat every 5 minute up to 3 doses if no relief call 911.   ondansetron  4 MG tablet Commonly known as: ZOFRAN  Take 1 tablet (4 mg total) by mouth every 8 (eight) hours as needed.   OneTouch Delica Plus Lancet33G Misc Apply topically 3 (three) times daily.   OneTouch Verio test strip Generic drug: glucose blood 1 each 3 (three) times daily.   oxyCODONE  5 MG  immediate release tablet Commonly known as: Oxy IR/ROXICODONE  Take 1 tablet (5 mg total) by mouth every 6 (six) to 8 (eight) hours as needed for pain.   Ozempic  (0.25 or 0.5 MG/DOSE) 2 MG/3ML Sopn Generic drug: Semaglutide (0.25 or 0.5MG /DOS) Inject 0.25 mg into the skin once a week.   pantoprazole  40 MG tablet Commonly known as: PROTONIX  Take 1 tablet (40 mg total) by mouth every morning.   polyethylene glycol 17 g packet Commonly known as: MIRALAX  / GLYCOLAX  Take 17 g by mouth daily.   PRESCRIPTION MEDICATION Inhale into the lungs at bedtime. CPAP   PROBIOTIC PO Take 1 tablet by mouth daily.   ranolazine  500 MG 12 hr tablet Commonly known as: Ranexa  Take 1 tablet (500 mg total) by mouth 2 (two) times daily.   rOPINIRole  2 MG tablet Commonly known as: REQUIP  Take 1 tablet (2 mg total) by mouth at bedtime.   rosuvastatin  10 MG tablet Commonly known as: CRESTOR  Take 1 tablet (10 mg total) by mouth daily.   tadalafil  20 MG tablet Commonly known as: CIALIS  Take 1 tablet (20 mg total) by mouth daily as needed.        Allergies:  Allergies  Allergen Reactions   Nsaids Other (See Comments)    Has 1 kidney left   Norvasc  [Amlodipine  Besylate] Swelling    Feet swell    Past Medical History, Surgical history, Social history, and Family History were reviewed and updated.  Review of Systems: Review of Systems  Constitutional: Negative.   HENT: Negative.    Eyes: Negative.   Respiratory: Negative.    Cardiovascular: Negative.   Gastrointestinal: Negative.   Genitourinary: Negative.   Musculoskeletal:  Positive for back pain.  Skin: Negative.   Neurological:  Positive for headaches.  Endo/Heme/Allergies: Negative.   Psychiatric/Behavioral: Negative.       Physical Exam:  height is 5' 9 (1.753 m) and weight is 217 lb 14.4 oz (98.8 kg). His oral temperature is 98.7 F (37.1 C). His blood pressure is 129/76 and his pulse is 71. His respiration is 20 and oxygen  saturation is 95%.   Wt Readings from Last 3 Encounters:  01/20/24 217 lb 14.4 oz (98.8 kg)  09/11/23 217 lb (98.4 kg)  05/15/23 219 lb (99.3 kg)    Physical Exam Vitals reviewed.  HENT:     Head: Normocephalic and atraumatic.  Eyes:     Pupils: Pupils are equal, round, and reactive to light.  Cardiovascular:     Rate and Rhythm: Normal rate and regular rhythm.     Heart sounds: Normal heart sounds.  Pulmonary:     Effort: Pulmonary effort is normal.     Breath sounds:  Normal breath sounds.  Abdominal:     General: Bowel sounds are normal.     Palpations: Abdomen is soft.  Musculoskeletal:        General: No tenderness or deformity. Normal range of motion.     Cervical back: Normal range of motion.  Lymphadenopathy:     Cervical: No cervical adenopathy.  Skin:    General: Skin is warm and dry.     Findings: No erythema or rash.  Neurological:     Mental Status: He is alert and oriented to person, place, and time.  Psychiatric:        Behavior: Behavior normal.        Thought Content: Thought content normal.        Judgment: Judgment normal.     Lab Results  Component Value Date   WBC 3.6 (L) 01/20/2024   HGB 13.9 01/20/2024   HCT 40.5 01/20/2024   MCV 98.8 01/20/2024   PLT 101 (L) 01/20/2024   Lab Results  Component Value Date   FERRITIN 230 06/04/2021   IRON 129 06/04/2021   TIBC 329 06/04/2021   UIBC 200 06/04/2021   IRONPCTSAT 39 06/04/2021   Lab Results  Component Value Date   RETICCTPCT 2.0 06/04/2021   RBC 4.10 (L) 01/20/2024   No results found for: JONATHAN BONG [facility] Lab Results  Component Value Date   IGA 232 01/25/2015   No results found for: STEPHANY CARLOTA BENSON MARKEL EARLA JOANNIE DOC VICK, SPEI   Chemistry      Component Value Date/Time   NA 139 01/20/2024 1221   NA 139 09/03/2021 1456   NA 134 05/04/2017 0925   K 4.0 01/20/2024 1221   K 4.5 05/04/2017 0925   CL 106  01/20/2024 1221   CL 97 (L) 05/04/2017 0925   CO2 23 01/20/2024 1221   CO2 23 05/04/2017 0925   BUN 23 01/20/2024 1221   BUN 21 09/03/2021 1456   BUN 28 (H) 05/04/2017 0925   CREATININE 1.59 (H) 01/20/2024 1221   CREATININE 1.94 (H) 05/01/2023 0924   GLU 86 03/31/2016 0000      Component Value Date/Time   CALCIUM  8.3 (L) 01/20/2024 1221   CALCIUM  9.3 05/04/2017 0925   ALKPHOS 46 01/20/2024 1221   ALKPHOS 58 05/04/2017 0925   AST 33 01/20/2024 1221   ALT 43 01/20/2024 1221   ALT 90 (H) 05/04/2017 0925   BILITOT 0.6 01/20/2024 1221       Impression and Plan: Mr. Saiz is a very pleasant 68 yo caucasian gentleman with infiltrating high grade papillary urothelial carcinoma with right nephrectomy in October 2018 and adjuvant chemotherapy.   So far, everything is done incredibly well with respect to the urothelial carcinoma.  It has now been 6 1/2 years since he had treatment.  I have to believe that he is cured.  I really do not see need for any scans unless some shows up on his labs or some shows up with his physical.  I still think that diabetes will be his long-term issue.  I will plan to see him back in 6 months now.  He still has a Port-A-Cath in.  I will make sure this gets flushed every 2 months.   Maude JONELLE Crease, MD 9/10/20251:34 PM

## 2024-01-20 NOTE — Patient Instructions (Signed)

## 2024-01-26 DIAGNOSIS — I129 Hypertensive chronic kidney disease with stage 1 through stage 4 chronic kidney disease, or unspecified chronic kidney disease: Secondary | ICD-10-CM | POA: Diagnosis not present

## 2024-01-26 DIAGNOSIS — Z23 Encounter for immunization: Secondary | ICD-10-CM | POA: Diagnosis not present

## 2024-01-26 DIAGNOSIS — N1831 Chronic kidney disease, stage 3a: Secondary | ICD-10-CM | POA: Diagnosis not present

## 2024-01-26 DIAGNOSIS — D631 Anemia in chronic kidney disease: Secondary | ICD-10-CM | POA: Diagnosis not present

## 2024-01-26 DIAGNOSIS — E1122 Type 2 diabetes mellitus with diabetic chronic kidney disease: Secondary | ICD-10-CM | POA: Diagnosis not present

## 2024-02-05 ENCOUNTER — Other Ambulatory Visit (HOSPITAL_BASED_OUTPATIENT_CLINIC_OR_DEPARTMENT_OTHER): Payer: Self-pay

## 2024-02-05 MED ORDER — LOSARTAN POTASSIUM 25 MG PO TABS
25.0000 mg | ORAL_TABLET | Freq: Every day | ORAL | 5 refills | Status: AC
  Filled 2024-02-05: qty 30, 30d supply, fill #0
  Filled 2024-03-07: qty 30, 30d supply, fill #1
  Filled 2024-04-11: qty 30, 30d supply, fill #2
  Filled 2024-05-08: qty 30, 30d supply, fill #3
  Filled 2024-06-14: qty 30, 30d supply, fill #4

## 2024-02-08 ENCOUNTER — Other Ambulatory Visit (HOSPITAL_BASED_OUTPATIENT_CLINIC_OR_DEPARTMENT_OTHER): Payer: Self-pay

## 2024-02-09 ENCOUNTER — Encounter: Payer: Self-pay | Admitting: Family

## 2024-02-09 ENCOUNTER — Other Ambulatory Visit (HOSPITAL_BASED_OUTPATIENT_CLINIC_OR_DEPARTMENT_OTHER): Payer: Self-pay

## 2024-02-09 DIAGNOSIS — C678 Malignant neoplasm of overlapping sites of bladder: Secondary | ICD-10-CM | POA: Diagnosis not present

## 2024-02-09 MED ORDER — TADALAFIL 20 MG PO TABS
20.0000 mg | ORAL_TABLET | Freq: Every day | ORAL | 11 refills | Status: AC | PRN
  Filled 2024-02-09: qty 30, 30d supply, fill #0
  Filled 2024-04-11: qty 30, 30d supply, fill #1
  Filled 2024-05-08: qty 30, 30d supply, fill #2
  Filled 2024-06-14: qty 30, 30d supply, fill #3

## 2024-02-09 MED ORDER — DIAZEPAM 10 MG PO TABS
10.0000 mg | ORAL_TABLET | ORAL | 0 refills | Status: AC
  Filled 2024-02-09: qty 2, 2d supply, fill #0

## 2024-03-02 ENCOUNTER — Inpatient Hospital Stay: Attending: Hematology & Oncology

## 2024-03-02 DIAGNOSIS — Z8551 Personal history of malignant neoplasm of bladder: Secondary | ICD-10-CM | POA: Diagnosis not present

## 2024-03-02 DIAGNOSIS — Z452 Encounter for adjustment and management of vascular access device: Secondary | ICD-10-CM | POA: Insufficient documentation

## 2024-03-02 NOTE — Patient Instructions (Signed)

## 2024-03-06 DIAGNOSIS — M79672 Pain in left foot: Secondary | ICD-10-CM | POA: Diagnosis not present

## 2024-03-06 DIAGNOSIS — M47814 Spondylosis without myelopathy or radiculopathy, thoracic region: Secondary | ICD-10-CM | POA: Diagnosis not present

## 2024-03-06 DIAGNOSIS — E1142 Type 2 diabetes mellitus with diabetic polyneuropathy: Secondary | ICD-10-CM | POA: Diagnosis not present

## 2024-03-06 DIAGNOSIS — M79671 Pain in right foot: Secondary | ICD-10-CM | POA: Diagnosis not present

## 2024-03-14 DIAGNOSIS — G4733 Obstructive sleep apnea (adult) (pediatric): Secondary | ICD-10-CM | POA: Diagnosis not present

## 2024-03-15 DIAGNOSIS — B351 Tinea unguium: Secondary | ICD-10-CM | POA: Diagnosis not present

## 2024-03-15 DIAGNOSIS — E1142 Type 2 diabetes mellitus with diabetic polyneuropathy: Secondary | ICD-10-CM | POA: Diagnosis not present

## 2024-03-16 ENCOUNTER — Telehealth: Payer: Self-pay

## 2024-03-16 ENCOUNTER — Other Ambulatory Visit: Payer: Self-pay

## 2024-03-16 ENCOUNTER — Other Ambulatory Visit (HOSPITAL_COMMUNITY): Payer: Self-pay

## 2024-03-16 NOTE — Telephone Encounter (Signed)
 Received pt portion [employer] ([employer] ) today waiting on provider portion.

## 2024-03-17 ENCOUNTER — Other Ambulatory Visit: Payer: Self-pay | Admitting: Internal Medicine

## 2024-03-18 MED ORDER — METOPROLOL SUCCINATE ER 25 MG PO TB24: ORAL_TABLET | ORAL | 0 refills | Status: DC

## 2024-03-18 MED ORDER — ROSUVASTATIN CALCIUM 10 MG PO TABS: 10.0000 mg | ORAL_TABLET | Freq: Every day | ORAL | 0 refills | Status: DC

## 2024-03-25 ENCOUNTER — Other Ambulatory Visit: Payer: Self-pay | Admitting: Internal Medicine

## 2024-03-28 MED ORDER — METOPROLOL SUCCINATE ER 25 MG PO TB24: ORAL_TABLET | ORAL | 0 refills | Status: DC

## 2024-04-11 ENCOUNTER — Other Ambulatory Visit (HOSPITAL_BASED_OUTPATIENT_CLINIC_OR_DEPARTMENT_OTHER): Payer: Self-pay

## 2024-04-11 ENCOUNTER — Encounter: Payer: Self-pay | Admitting: Family

## 2024-04-13 ENCOUNTER — Inpatient Hospital Stay: Attending: Hematology & Oncology

## 2024-04-13 DIAGNOSIS — Z8551 Personal history of malignant neoplasm of bladder: Secondary | ICD-10-CM | POA: Insufficient documentation

## 2024-04-13 DIAGNOSIS — Z452 Encounter for adjustment and management of vascular access device: Secondary | ICD-10-CM | POA: Diagnosis present

## 2024-04-13 NOTE — Patient Instructions (Signed)

## 2024-04-25 NOTE — Progress Notes (Signed)
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 daily.  05/02/2024 HDL 30, Triglycerides 179.   Hypertension: treated with metoprolol  succinate 25 mg daily. Followed by cardiologist, Dr. Okey. Blood pressure normal today at 130/80.   Anxiety and depression treated with alprazolam  1 mg three times daily as needed.  GE Reflex:  treated with pantoprazole  40 mg daily.   BPH seen by Dr. Cam, Urologist  History of hearing loss.  History of skin malignancy seen by [facility].  chronic kidney disease: due to his hx of bladder carcinoma, DM and HTN. 05/02/2024 Creatinine 2.08, eGFR 34. Followed by Dr, Ephriam Stank at [facility].     Creatinine collected today.   Followed by podiatrist for diabetic polyneuropathy, Tailor's bunion both feet, onychomycosis, osteochondritis dissecans left ankle, capsulitis left  ankle.  Continues to see Dr. Timmy, Oncologist .  Parient has hx of Stage 3a CKD.  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  Apparently had left inguinal hernia surgery in 2022.  Dr. Timmy noted he had peripheral neuropathy from diabetes.  Dr. Timmy notes that he has 1 kidney (left).    09/29/2022 Coronary Calcium  score 216.    05/11/2023 Colonoscopy The examined portion of the ileum was normal. Eight 3 to 8 mm polyps in the transverse colon and in the ascending colon, removed with a cold snare. Resected and retrieved.  Pathology found to be benign but precancerous. Diverticulosis in the sigmoid colon and in the descending colon. Non- bleeding internal hemorrhoids.   Health maintenance: Eye exam in February   Vaccine counseling: Covid-19 vaccine declined.    Return in 1 year (on 05/09/2025).   Annual Wellness Visit done today including the all of the following: Reviewed patient's Family Medical History Reviewed patient's SDOH and reviewed tobacco, alcohol , and drug use.  Reviewed and updated list of patient's medical providers Assessment of cognitive impairment was done Assessed patient's functional ability Established a written schedule for health screening services Health Risk Assessent Completed  and Reviewed  Discussed health benefits of physical activity, and encouraged him to engage in regular exercise appropriate for his age and condition.    I,Makayla C Reid,acting as a scribe for Ronal JINNY Hailstone, MD.,have documented all relevant documentation on the behalf of Ronal JINNY Hailstone, MD,as directed by  Ronal JINNY Hailstone, MD while in the presence of Ronal JINNY Hailstone, MD.  I, Ronal JINNY Hailstone, MD, have reviewed all documentation for and agree with the above Annual Wellness Visit documentation.  Ronal JINNY Hailstone, MD Internal Medicine 05/09/2024     [1]   Allergies Allergen Reactions   Nsaids Other (See Comments)    Has 1 kidney left   Norvasc  [Amlodipine  Besylate] Swelling    Feet swell  [2]  Social History Tobacco Use   Smoking status: Former    Current packs/day: 0.00    Types: Cigarettes    Start date: 05/13/1987    Quit date: 05/13/2007    Years since quitting: 17.0   Smokeless tobacco: Never  Vaping Use   Vaping status: Never Used  Substance Use Topics   Alcohol  use: Yes    Alcohol /week: 5.0 standard drinks of alcohol     Types: 1 Glasses of wine, 4 Cans of beer per week   Drug use: Never   "

## 2024-05-02 ENCOUNTER — Other Ambulatory Visit: Payer: Medicare Other

## 2024-05-02 NOTE — Progress Notes (Signed)
 Lab only

## 2024-05-03 ENCOUNTER — Ambulatory Visit: Payer: Self-pay | Admitting: Internal Medicine

## 2024-05-03 LAB — CBC WITH DIFFERENTIAL/PLATELET
Absolute Lymphocytes: 1472 {cells}/uL (ref 850–3900)
Absolute Monocytes: 383 {cells}/uL (ref 200–950)
Basophils Absolute: 18 {cells}/uL (ref 0–200)
Basophils Relative: 0.4 %
Eosinophils Absolute: 90 {cells}/uL (ref 15–500)
Eosinophils Relative: 2 %
HCT: 47.9 % (ref 39.4–51.1)
Hemoglobin: 15.8 g/dL (ref 13.2–17.1)
MCH: 33.3 pg — ABNORMAL HIGH (ref 27.0–33.0)
MCHC: 33 g/dL (ref 31.6–35.4)
MCV: 101.1 fL (ref 81.4–101.7)
MPV: 10 fL (ref 7.5–12.5)
Monocytes Relative: 8.5 %
Neutro Abs: 2538 {cells}/uL (ref 1500–7800)
Neutrophils Relative %: 56.4 %
Platelets: 119 Thousand/uL — ABNORMAL LOW (ref 140–400)
RBC: 4.74 Million/uL (ref 4.20–5.80)
RDW: 12.9 % (ref 11.0–15.0)
Total Lymphocyte: 32.7 %
WBC: 4.5 Thousand/uL (ref 3.8–10.8)

## 2024-05-03 LAB — COMPREHENSIVE METABOLIC PANEL WITH GFR
AG Ratio: 2 (calc) (ref 1.0–2.5)
ALT: 26 U/L (ref 9–46)
AST: 19 U/L (ref 10–35)
Albumin: 4.3 g/dL (ref 3.6–5.1)
Alkaline phosphatase (APISO): 47 U/L (ref 35–144)
BUN/Creatinine Ratio: 9 (calc) (ref 6–22)
BUN: 19 mg/dL (ref 7–25)
CO2: 29 mmol/L (ref 20–32)
Calcium: 9.5 mg/dL (ref 8.6–10.3)
Chloride: 102 mmol/L (ref 98–110)
Creat: 2.08 mg/dL — ABNORMAL HIGH (ref 0.70–1.35)
Globulin: 2.2 g/dL (ref 1.9–3.7)
Glucose, Bld: 101 mg/dL — ABNORMAL HIGH (ref 65–99)
Potassium: 4.3 mmol/L (ref 3.5–5.3)
Sodium: 138 mmol/L (ref 135–146)
Total Bilirubin: 0.5 mg/dL (ref 0.2–1.2)
Total Protein: 6.5 g/dL (ref 6.1–8.1)
eGFR: 34 mL/min/1.73m2 — ABNORMAL LOW

## 2024-05-03 LAB — LIPID PANEL
Cholesterol: 118 mg/dL
HDL: 30 mg/dL — ABNORMAL LOW
LDL Cholesterol (Calc): 62 mg/dL
Non-HDL Cholesterol (Calc): 88 mg/dL
Total CHOL/HDL Ratio: 3.9 (calc)
Triglycerides: 179 mg/dL — ABNORMAL HIGH

## 2024-05-03 LAB — HEMOGLOBIN A1C
Hgb A1c MFr Bld: 6.6 % — ABNORMAL HIGH
Mean Plasma Glucose: 143 mg/dL
eAG (mmol/L): 7.9 mmol/L

## 2024-05-03 LAB — PSA: PSA: 0.45 ng/mL

## 2024-05-09 ENCOUNTER — Ambulatory Visit: Payer: Medicare Other | Admitting: Internal Medicine

## 2024-05-09 ENCOUNTER — Other Ambulatory Visit (HOSPITAL_BASED_OUTPATIENT_CLINIC_OR_DEPARTMENT_OTHER): Payer: Self-pay

## 2024-05-09 ENCOUNTER — Encounter: Payer: Self-pay | Admitting: Family

## 2024-05-09 ENCOUNTER — Encounter: Payer: Self-pay | Admitting: Internal Medicine

## 2024-05-09 VITALS — BP 130/80 | HR 60 | Ht 69.0 in | Wt 213.0 lb

## 2024-05-09 DIAGNOSIS — G2581 Restless legs syndrome: Secondary | ICD-10-CM

## 2024-05-09 DIAGNOSIS — N1832 Chronic kidney disease, stage 3b: Secondary | ICD-10-CM | POA: Diagnosis not present

## 2024-05-09 DIAGNOSIS — E781 Pure hyperglyceridemia: Secondary | ICD-10-CM

## 2024-05-09 DIAGNOSIS — Z860101 Personal history of adenomatous and serrated colon polyps: Secondary | ICD-10-CM

## 2024-05-09 DIAGNOSIS — Z905 Acquired absence of kidney: Secondary | ICD-10-CM

## 2024-05-09 DIAGNOSIS — K219 Gastro-esophageal reflux disease without esophagitis: Secondary | ICD-10-CM

## 2024-05-09 DIAGNOSIS — E0842 Diabetes mellitus due to underlying condition with diabetic polyneuropathy: Secondary | ICD-10-CM

## 2024-05-09 DIAGNOSIS — F419 Anxiety disorder, unspecified: Secondary | ICD-10-CM

## 2024-05-09 DIAGNOSIS — Z96653 Presence of artificial knee joint, bilateral: Secondary | ICD-10-CM

## 2024-05-09 DIAGNOSIS — Z0000 Encounter for general adult medical examination without abnormal findings: Secondary | ICD-10-CM | POA: Diagnosis not present

## 2024-05-09 DIAGNOSIS — G620 Drug-induced polyneuropathy: Secondary | ICD-10-CM

## 2024-05-09 DIAGNOSIS — C679 Malignant neoplasm of bladder, unspecified: Secondary | ICD-10-CM

## 2024-05-09 DIAGNOSIS — Z7985 Long-term (current) use of injectable non-insulin antidiabetic drugs: Secondary | ICD-10-CM | POA: Diagnosis not present

## 2024-05-09 DIAGNOSIS — E785 Hyperlipidemia, unspecified: Secondary | ICD-10-CM | POA: Diagnosis not present

## 2024-05-09 DIAGNOSIS — Z85828 Personal history of other malignant neoplasm of skin: Secondary | ICD-10-CM

## 2024-05-09 DIAGNOSIS — Z794 Long term (current) use of insulin: Secondary | ICD-10-CM | POA: Diagnosis not present

## 2024-05-09 DIAGNOSIS — G4730 Sleep apnea, unspecified: Secondary | ICD-10-CM

## 2024-05-09 DIAGNOSIS — F32A Depression, unspecified: Secondary | ICD-10-CM

## 2024-05-09 DIAGNOSIS — E1122 Type 2 diabetes mellitus with diabetic chronic kidney disease: Secondary | ICD-10-CM

## 2024-05-09 DIAGNOSIS — R7989 Other specified abnormal findings of blood chemistry: Secondary | ICD-10-CM

## 2024-05-09 DIAGNOSIS — E786 Lipoprotein deficiency: Secondary | ICD-10-CM

## 2024-05-09 DIAGNOSIS — N1831 Chronic kidney disease, stage 3a: Secondary | ICD-10-CM

## 2024-05-09 DIAGNOSIS — G4733 Obstructive sleep apnea (adult) (pediatric): Secondary | ICD-10-CM

## 2024-05-09 DIAGNOSIS — I252 Old myocardial infarction: Secondary | ICD-10-CM

## 2024-05-09 DIAGNOSIS — I10 Essential (primary) hypertension: Secondary | ICD-10-CM

## 2024-05-09 DIAGNOSIS — H903 Sensorineural hearing loss, bilateral: Secondary | ICD-10-CM

## 2024-05-09 NOTE — Progress Notes (Signed)
 "  Chief Complaint  Patient presents with   Annual Exam   Medicare Wellness     Subjective:   Isaac [name] is a 68 y.o. male who presents for a Medicare Annual Wellness Visit.  Visit info / Clinical Intake: Medicare Wellness Visit Type:: Subsequent Annual Wellness Visit Persons participating in visit and providing information:: patient Medicare Wellness Visit Mode:: In-person (required for WTM) Interpreter Needed?: No Pre-visit prep was completed: yes AWV questionnaire completed by patient prior to visit?: yes Date:: 05/06/24 Living arrangements:: (!) lives alone Patient's Overall Health Status Rating: good Typical amount of pain: some Does pain affect daily life?: (!) yes Are you currently prescribed opioids?: (!) yes  Dietary Habits and Nutritional Risks How many meals a day?: (Patient-Rptd) 2 Eats fruit and vegetables daily?: (!) no Most meals are obtained by: preparing own meals Diabetic:: no  Functional Status Activities of Daily Living (to include ambulation/medication): Independent Ambulation: Independent Medication Administration: (Patient-Rptd) Independent Home Management (perform basic housework or laundry): Independent Manage your own finances?: yes Primary transportation is: driving Concerns about vision?: no *vision screening is required for WTM* Concerns about hearing?: no  Fall Screening Falls in the past year?: 1 Number of falls in past year: 0 Was there an injury with Fall?: 1 Fall Risk Category Calculator: 2 Patient Fall Risk Level: Moderate Fall Risk  Fall Risk Patient at Risk for Falls Due to: Other (Comment) (standing on bed, changing a light bulb) Fall risk Follow up: Education provided; Falls prevention discussed  Home and Transportation Safety: All rugs have non-skid backing?: (!) no All stairs or steps have railings?: yes Grab bars in the bathtub or shower?: yes Have non-skid surface in bathtub or shower?: yes Good home lighting?:  yes Regular seat belt use?: yes Hospital stays in the last year:: no  Cognitive Assessment Difficulty concentrating, remembering, or making decisions? : yes Will 6CIT or Mini Cog be Completed: yes What year is it?: 0 points What month is it?: 0 points About what time is it?: 0 points Count backwards from 20 to 1: 0 points Say the months of the year in reverse: 0 points Repeat the address phrase from earlier: 0 points 6 CIT Score: 0 points  Advance Directives (For Healthcare) Does Patient Have a Medical Advance Directive?: Yes Does patient want to make changes to medical advance directive?: No - Patient declined Type of Advance Directive: Healthcare Power of [place]; Living will Copy of Healthcare Power of Attorney in Chart?: Yes - validated most recent copy scanned in chart (See row information) Copy of Living Will in Chart?: Yes - validated most recent copy scanned in chart (See row information)  Reviewed/Updated  Reviewed/Updated: Reviewed All (Medical, Surgical, Family, Medications, Allergies, Care Teams, Patient Goals)    Allergies (verified) Nsaids and Norvasc  [amlodipine  besylate]   Current Medications (verified) Outpatient Encounter Medications as of 05/09/2024  Medication Sig   acetaminophen  (TYLENOL ) 500 MG tablet Take 500 mg by mouth every 6 (six) hours as needed for moderate pain or headache.   ALPRAZolam  (XANAX ) 1 MG tablet Take 1 tablet (1 mg total) by mouth 3 (three) times daily as needed for anxiety.   amoxicillin  (AMOXIL ) 500 MG capsule Take four capsules (2000 mg) by mouth one hour prior to dental appointment.   aspirin  EC 81 MG tablet Take 81 mg by mouth daily.   dapagliflozin  propanediol (FARXIGA ) 10 MG TABS tablet Take 1 tablet (10 mg total) by mouth daily.   diazepam  (VALIUM ) 10 MG tablet Take 1  tablet (10 mg total) by mouth one hour prior to procedure.   EMBECTA PEN NEEDLE NANO 2 GEN 32G X 4 MM MISC 2 (two) times daily.   glimepiride  (AMARYL ) 2 MG tablet  TAKE 1 BY MOUTH DAILY BEFORE SUPPER (Patient taking differently: Take 2 mg by mouth daily.)   hydrALAZINE  (APRESOLINE ) 10 MG tablet Take 1 tablet (10 mg total) by mouth 3 (three) times daily.   insulin  glargine, 1 Unit Dial, (TOUJEO ) 300 UNIT/ML Solostar Pen Inject 40 Units into the skin 2 (two) times daily.    Lancets (ONETOUCH DELICA PLUS LANCET33G) MISC Apply topically 3 (three) times daily.   lidocaine -prilocaine  (EMLA ) cream Apply topically once. Apply a dime size to port-a-cath 1-2 hours prior to access. Cover with [employer].   losartan  (COZAAR ) 25 MG tablet 1 tab by mouth daily   magic mouthwash (lidocaine , diphenhydrAMINE , alum & mag hydroxide) suspension Rinse mouth with 15 mLs and expectorate 3 (three) times daily as needed.   metoprolol  succinate (TOPROL -XL) 25 MG 24 hr tablet TAKE 1 TABLET BY MOUTH AT BEDTIME. GENERIC EQUIVALENT FOR TOPROL -XL   Multiple Vitamins-Minerals (MULTIVITAMIN WITH MINERALS) tablet Take 1 tablet by mouth daily.   nitroGLYCERIN  (NITROSTAT ) 0.4 MG SL tablet Place 1 tablet (0.4 mg total) under the tongue every 5 (five) minutes as needed for chest pain. Repeat every 5 minute up to 3 doses if no relief call 911.   ondansetron  (ZOFRAN ) 4 MG tablet Take 1 tablet (4 mg total) by mouth every 8 (eight) hours as needed.   ONETOUCH VERIO test strip 1 each 3 (three) times daily.   oxyCODONE  (OXY IR/ROXICODONE ) 5 MG immediate release tablet Take 1 tablet (5 mg total) by mouth every 6 (six) to 8 (eight) hours as needed for pain.   pantoprazole  (PROTONIX ) 40 MG tablet Take 1 tablet (40 mg total) by mouth every morning.   polyethylene glycol (MIRALAX  / GLYCOLAX ) 17 g packet Take 17 g by mouth daily.   PRESCRIPTION MEDICATION Inhale into the lungs at bedtime. CPAP   Probiotic Product (PROBIOTIC PO) Take 1 tablet by mouth daily.   ranolazine  (RANEXA ) 500 MG 12 hr tablet Take 1 tablet (500 mg total) by mouth 2 (two) times daily.   rOPINIRole  (REQUIP ) 2 MG tablet Take 1 tablet (2  mg total) by mouth at bedtime.   rosuvastatin  (CRESTOR ) 10 MG tablet Take 1 tablet (10 mg total) by mouth daily.   Semaglutide ,0.25 or 0.5MG /DOS, (OZEMPIC , 0.25 OR 0.5 MG/DOSE,) 2 MG/3ML SOPN Inject 0.25 mg into the skin once a week.   tadalafil  (CIALIS ) 20 MG tablet Take 1 tablet (20 mg total) by mouth daily as needed.   Facility-Administered Encounter Medications as of 05/09/2024  Medication   LORazepam  (ATIVAN ) injection 0.5 mg   sodium chloride  flush (NS) 0.9 % injection 10 mL   sodium chloride  flush (NS) 0.9 % injection 10 mL    History: Past Medical History:  Diagnosis Date   Arthritis    Bladder cancer [facility]) urologist-- dr cam   11/ 2019 recurrent    Cancer of renal pelvis, right [facility])    oncologist-  dr timmy--  high grade papillary urothelial carcinoma, Stage III (T3 Nx M0) -- 02-12-2017  s/p  left nephrectomy--- completed chemotherapy 07-27-2017   Cataract    Chemotherapy-induced neuropathy    feet   CKD (chronic kidney disease) stage 3, GFR 30-59 ml/min (HCC)    Coronary artery disease    Coronary vasospasm PER DR ROSS NOTE 03-22-2012--  INTER. 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/ reimplantation   Hyperlipidemia    Hypertension    Left renal artery stenosis    Myocardial infarction (HCC) 2005   Nocturia more than twice per night    OSA on CPAP    Pneumonia    Renal artery stenosis    Sleep apnea    Thrombocytopenia    Type 2 diabetes mellitus treated with insulin  [facility])     endocrinologist-- dr hosie ([facility] in [place])  lov note in epic    Urgency of urination    Wears glasses    Wisdom teeth extracted    Past Surgical History:  Procedure Laterality Date   APPENDECTOMY  08/03/2004   CALCANEAL OSTEOTOMY Left 10/01/2021   Procedure: LEFT FOOT MEDIAL DISPLACEMENT CALCANEAL OSTEOTOMY;  Surgeon: Elsa Lonni SAUNDERS, MD;  Location: [facility] OR;  Service: Orthopedics;  Laterality: Left;  LENGTH OF SURGERY: 120 MINUTES   CARDIAC CATHETERIZATION  x3  last one 06-17-2007   MILD NON-OBSTRUCTIVE CAD/ NORMAL LVF/ 30% LEFT RENAL ARTERY STENOSIS   CARDIOVASCULAR STRESS TEST  02/23/2013   Low risk nuclear study w/ small inferior wall infarct at mid and basal level with no ischemia/  normal LV function and wall motion , ef 57%   COLON SURGERY  03/11/2021   colon   COLONOSCOPY  05/11/2023   [facility] at [facility]   COLONOSCOPY W/ POLYPECTOMY  03/08/2020   Gwendlyn Buddy at [facility] x3   CYSTO/ BILATERAL RETROGRADE PYELOGRAM/ RIGHT URETEROSCOPIC LASER FULGURATION URETERAL TUMOR  02/16/2008   CYSTO/ BLADDER BX  09-13-2008;  08-13-2007; 04-19-2007; 06-01-2006   CYSTO/ CYSTOGRAM/ URETEROSCOPY  02/21/2009   CYSTO/ RESECTION BLADDER TUMOR/ LEFT RETROGRADE PYELOGRAM/ RIGHT URETEROSCOPY  08/07/2010   TCC OF BLADDER   S/P DISTAL URETERECTOMY/ REIMPLANTATION   CYSTO/ RIGHT URETEROSCOPY/ BX URETERAL TUMOR  10/11/2008   CYSTOSCOPY W/ RETROGRADES  04/19/2012   Procedure: CYSTOSCOPY WITH RETROGRADE PYELOGRAM;  Surgeon: Alm GORMAN Fragmin, MD;  Location: [facility];  Service: Urology;  Laterality: Bilateral;  30 MINS Cysto, Biopsy, possible TURBT, Bilateral retrograde pyelograms with Mitomycin  C instilation Post op    CYSTOSCOPY W/ RETROGRADES Left 03/25/2018   Procedure: CYSTOSCOPY WITH RETROGRADE PYELOGRAM;  Surgeon: Cam Morene ORN, MD;  Location[facility];  Service: Urology;  Laterality: Left;   CYSTOSCOPY WITH BIOPSY  04/07/2011   Procedure:  CYSTOSCOPY WITH BIOPSY/ RIGHT URETEROSCOPY;  Surgeon: Alm GORMAN Fragmin, MD;  Location: [facility];  Service: Urology;  Laterality: N/A;    cystoscopy, cystogram, biopsy and fulgeration   CYSTOSCOPY WITH BIOPSY  12/29/2011   Procedure: CYSTOSCOPY WITH BIOPSY;  Surgeon: Alm GORMAN Fragmin, MD;  Location: [facility];  Service: Urology;  Laterality: N/A;  30 MIN  WITH FULGERATION     CYSTOSCOPY WITH RETROGRADE PYELOGRAM, URETEROSCOPY AND STENT PLACEMENT Left 04/28/2018   Procedure: LEFT RETROGRADE PYELOGRAM, DIAGNOSTIC LEFT URETEROSCOPY;  Surgeon: Cam Morene ORN, MD;  Location: [facility];  Service: Urology;  Laterality: Left;   CYSTOSCOPY WITH STENT PLACEMENT N/A 03/09/2020   Procedure: CYSTOSCOPY WITH FIREFLYER  PLACEMENT;  Surgeon: Cam Morene ORN, MD;  Location: [facility];  Service: Urology;  Laterality: N/A;   ELBOW SURGERY  07/07/2003   RIGHT ELBOW ARTHROSCOPY W/ OPEN RECONSTRUCTION   FLEXOR TENDON REPAIR Left 10/01/2021   Procedure: POSTERIOR TIBIAL TENDON RECONSTRUCTION;  Surgeon: Elsa Lonni SAUNDERS, MD;  Location: [facility] OR;  Service: Orthopedics;  Laterality: Left;   HERNIA REPAIR     IR FLUORO GUIDE PORT INSERTION RIGHT  04/01/2017   IR US  GUIDE VASC ACCESS RIGHT  04/01/2017   JOINT REPLACEMENT  2017   bilateral knees   KNEE ARTHROSCOPY W/ DEBRIDEMENT  02/08/2004   RIGHT KNEE   OSTOMY N/A 03/09/2020   Procedure: POSSIBLE OSTOMY;  Surgeon: Sheldon Standing, MD;  Location: [facility];  Service: General;  Laterality: N/A;   PANENDOSCOPY N/A 03/09/2020   Procedure: PANENDOSCOPY RIGID;  Surgeon: Sheldon Standing, MD;  Location: [facility];  Service: General;  Laterality: N/A;   REPAIR EXTENSOR TENDON Left 10/01/2021   Procedure: CALCANEONAVICULAR LIGAMENT RECONSTRUCTION;  Surgeon: Elsa Lonni SAUNDERS, MD;  Location: [facility] OR;  Service: Orthopedics;  Laterality: Left;   RHINOPLASTY W/ MODIFIED CARTILAGE GRAFT  05/17/2007   INTERNAL NASAL VALVE COLLAPSE/ OSA/ SEPTAL PERFERATION    RIGHT DISTAL URETERECTOMY W/ REIMPLANTATION  10/30/2008   TCC OF RIGHT DISTAL URETER   ROBOT ASSITED LAPAROSCOPIC NEPHROURETERECTOMY Right 02/12/2017   Procedure: XI ROBOT ASSITED LAPAROSCOPIC NEPHROURETERECTOMY;  Surgeon: Cam Morene ORN, MD;  Location: [facility];  Service: Urology;  Laterality: Right;   SHOULDER SURGERY  2005   RIGHT ROTATOR CUFF REPAIR   TOTAL KNEE ARTHROPLASTY Left 05/18/2015   TOTAL KNEE ARTHROPLASTY Left 05/18/2015   Procedure: TOTAL KNEE ARTHROPLASTY;  Surgeon: Toribio Silos, MD;  Location: [facility] OR;  Service: Orthopedics;  Laterality: Left;   TOTAL KNEE ARTHROPLASTY Right 03/21/2016   Procedure: TOTAL KNEE ARTHROPLASTY;  Surgeon: Toribio Silos, MD;  Location: [facility] OR;  Service: Orthopedics;  Laterality: Right;   TRANSTHORACIC ECHOCARDIOGRAM  02/03/2017   ef 60-65%, grade 1 diastolic dysfunction/  trivial PR   TRANSURETHRAL RESECTION OF BLADDER TUMOR  01/13/2007   TRANSURETHRAL RESECTION OF BLADDER TUMOR  04/19/2012   Procedure: TRANSURETHRAL RESECTION OF BLADDER TUMOR (TURBT);  Surgeon: Alm GORMAN Fragmin, MD;  Location: [facility];  Service: Urology;  Laterality: N/A;   TRANSURETHRAL RESECTION OF BLADDER TUMOR N/A 03/25/2018   Procedure: TRANSURETHRAL RESECTION OF BLADDER TUMOR (TURBT);  Surgeon: Cam Morene ORN, MD;  Location: [facility];  Service: Urology;  Laterality: N/A;   TRANSURETHRAL RESECTION OF BLADDER TUMOR N/A 04/28/2018   Procedure: TRANSURETHRAL RESECTION OF BLADDER TUMOR (TURBT);  Surgeon: Cam Morene ORN, MD;  Location: [facility];  Service: Urology;  Laterality: N/A;   umbillical hernia repair  2004   Family History  Problem Relation Age of Onset   Coronary artery disease Father    Cancer Father    Diabetes Maternal Grandmother    Heart disease Paternal Grandfather    Colon cancer Neg Hx    Stomach cancer Neg Hx    Esophageal cancer Neg Hx    Rectal cancer Neg Hx    Colon polyps Neg Hx    Social History    Occupational History   Not on file  Tobacco Use   Smoking status: Former    Current packs/day: 0.00    Types: Cigarettes    Start  date: 05/13/1987    Quit date: 05/13/2007    Years since quitting: 17.0   Smokeless tobacco: Never  Vaping Use   Vaping status: Never Used  Substance and Sexual Activity   Alcohol  use: Yes    Alcohol /week: 5.0 standard drinks of alcohol     Types: 1 Glasses of wine, 4 Cans of beer per week   Drug use: Never   Sexual activity: Not Currently    Birth control/protection: Abstinence   Tobacco Counseling Counseling given: Not Answered  SDOH Screenings   Food Insecurity: No Food Insecurity (05/09/2024)  Housing: Low Risk (05/09/2024)  Transportation Needs: No Transportation Needs (05/09/2024)  Utilities: Not At Risk (05/09/2024)  Alcohol  Screen: Low Risk (05/09/2024)  Depression (PHQ2-9): Low Risk (05/09/2024)  Financial Resource Strain: Medium Risk (05/09/2024)  Physical Activity: Insufficiently Active (05/09/2024)  Social Connections: Moderately Isolated (05/09/2024)  Stress: Stress Concern Present (05/09/2024)  Tobacco Use: Medium Risk (05/09/2024)  Health Literacy: Adequate Health Literacy (05/09/2024)   See flowsheets for full screening details  Depression Screen PHQ 2 & 9 Depression Scale- Over the past 2 weeks, how often have you been bothered by any of the following problems? Little interest or pleasure in doing things: 0 Feeling down, depressed, or hopeless (PHQ Adolescent also includes...irritable): 0 PHQ-2 Total Score: 0     Goals Addressed               This Visit's Progress     Activity and Exercise Increased (pt-stated)        Evidence-based guidance:  Review current exercise levels.  Assess patient perspective on exercise or activity level, barriers to increasing activity, motivation and readiness for change.  Recommend or set healthy exercise goal based on individual tolerance.  Encourage small steps toward making change  in amount of exercise or activity.  Urge reduction of sedentary activities or screen time.  Promote group activities within the community or with family or support person.  Consider referral to rehabiliation therapist for assessment and exercise/activity plan.   Notes:              Objective:    Today's Vitals   05/09/24 1051  BP: 130/80  Pulse: 60  SpO2: 97%  Weight: 213 lb (96.6 kg)  Height: 5' 9 (1.753 m)  PainSc: 0-No pain   Body mass index is 31.45 kg/m.  Hearing/Vision screen Vision Screening - Comments:: Patient stated he is up to date with his yearly eye exam done at [facility]  Immunizations and Health Maintenance Health Maintenance  Topic Date Due   OPHTHALMOLOGY EXAM  05/31/2021   COVID-19 Vaccine (6 - 2025-26 season) 01/11/2024   Diabetic kidney evaluation - Urine ACR  07/29/2024   HEMOGLOBIN A1C  10/31/2024   Diabetic kidney evaluation - eGFR measurement  05/02/2025   FOOT EXAM  05/09/2025   Medicare Annual Wellness (AWV)  05/09/2025   Colonoscopy  05/10/2026   DTaP/Tdap/Td (4 - Td or Tdap) 12/08/2032   Pneumococcal Vaccine: 50+ Years  Completed   Influenza Vaccine  Completed   Hepatitis C Screening  Completed   Zoster Vaccines- Shingrix  Completed   Meningococcal B Vaccine  Aged Out        Assessment/Plan:  This is a routine wellness examination for Jedadiah.  Patient Care Team: Perri Ronal PARAS, MD as PCP - General (Internal Medicine) Okey Vina GAILS, MD as PCP - Cardiology (Cardiology) Sheldon Standing, MD as Consulting Physician (Colon and Rectal Surgery) Cam Morene ORN, MD as Attending  Physician (Urology) Aneita Gwendlyn DASEN, MD (Inactive) as Consulting Physician (Gastroenterology)  I have personally reviewed and noted the following in the patients chart:   Medical and social history Use of alcohol , tobacco or illicit drugs  Current medications and supplements including opioid prescriptions. Functional ability and  status Nutritional status Physical activity Advanced directives List of other physicians Hospitalizations, surgeries, and ER visits in previous 12 months Vitals Screenings to include cognitive, depression, and falls Referrals and appointments  No orders of the defined types were placed in this encounter.  In addition, I have reviewed and discussed with patient certain preventive protocols, quality metrics, and best practice recommendations. A written personalized care plan for preventive services as well as general preventive health recommendations were provided to patient.   Ulysee Fyock Zelda, CMA   05/09/2024   Return in 1 year (on 05/09/2025).  After Visit Summary: (In Person-Printed) AVS printed and given to the patient  Nurse Notes: none "

## 2024-05-09 NOTE — Patient Instructions (Addendum)
 Isaac [name],  Labs are reviewed today. Creatinine was elevated at 2.08 likely due to fasting and volume depletion and is being repeated today. Continue follow up with [facility] for chronic kidney disease. Continue current meds and return in one year or as needed. Continue specialty follow up. Thank you for taking the time for your Medicare Wellness Visit. I appreciate your continued commitment to your health goals. Please review the care plan we discussed, and feel free to reach out if I can assist you further.  Please note that Annual Wellness Visits do not include a physical exam. Some assessments may be limited, especially if the visit was conducted virtually. If needed, we may recommend an in-person follow-up with your provider.  Ongoing Care Seeing your primary care provider every 3 to 6 months helps us  monitor your health and provide consistent, personalized care.   Referrals If a referral was made during today's visit and you haven't received any updates within two weeks, please contact the referred provider directly to check on the status.  Recommended Screenings:  Health Maintenance  Topic Date Due   Eye exam for diabetics  05/31/2021   COVID-19 Vaccine (6 - 2025-26 season) 01/11/2024   Medicare Annual Wellness Visit  05/03/2024   Yearly kidney health urinalysis for diabetes  07/29/2024   Hemoglobin A1C  10/31/2024   Yearly kidney function blood test for diabetes  05/02/2025   Complete foot exam   05/09/2025   Colon Cancer Screening  05/10/2026   DTaP/Tdap/Td vaccine (4 - Td or Tdap) 12/08/2032   Pneumococcal Vaccine for age over 52  Completed   Flu Shot  Completed   Hepatitis C Screening  Completed   Zoster (Shingles) Vaccine  Completed   Meningitis B Vaccine  Aged Out       05/09/2024   10:56 AM  Advanced Directives  Does Patient Have a Medical Advance Directive? Yes  Type of Estate Agent of [place];Living will  Does patient want to make  changes to medical advance directive? No - Patient declined  Copy of Healthcare Power of Attorney in Chart? Yes - validated most recent copy scanned in chart (See row information)    Vision: Annual vision screenings are recommended for early detection of glaucoma, cataracts, and diabetic retinopathy. These exams can also reveal signs of chronic conditions such as diabetes and high blood pressure.  Dental: Annual dental screenings help detect early signs of oral cancer, gum disease, and other conditions linked to overall health, including heart disease and diabetes.  Please see the attached documents for additional preventive care recommendations.

## 2024-05-10 ENCOUNTER — Ambulatory Visit: Payer: Self-pay | Admitting: Internal Medicine

## 2024-05-10 LAB — CREATININE, SERUM: Creat: 1.8 mg/dL — ABNORMAL HIGH (ref 0.70–1.35)

## 2024-05-18 ENCOUNTER — Telehealth: Payer: Self-pay | Admitting: Internal Medicine

## 2024-05-18 NOTE — Telephone Encounter (Signed)
" °*  STAT* If patient is at the pharmacy, call can be transferred to refill team.   1. Which medications need to be refilled? (please list name of each medication and dose if known)   metoprolol  succinate (TOPROL -XL) 25 MG 24 hr tablet    hydrALAZINE  (APRESOLINE ) 10 MG tablet     ranolazine  (RANEXA ) 500 MG 12 hr tablet    nitroGLYCERIN  (NITROSTAT ) 0.4 MG SL table    rosuvastatin  (CRESTOR ) 10 MG tablet   2. Which pharmacy/location (including street and city if local pharmacy) is medication to be sent to? [employer] [facility] - [place], [place] - [address] [place] [address] Phone: [phone]  Fax: [phone]       Significant History/Details      3. Do they need a 30 day or 90 day supply? Pt has made appt for 06/06/24 please refill until then  "

## 2024-05-19 MED ORDER — HYDRALAZINE HCL 10 MG PO TABS: 10.0000 mg | ORAL_TABLET | Freq: Three times a day (TID) | ORAL | 0 refills | Status: AC

## 2024-05-19 MED ORDER — RANOLAZINE ER 500 MG PO TB12: 500.0000 mg | ORAL_TABLET | Freq: Two times a day (BID) | ORAL | 0 refills | Status: AC

## 2024-05-19 MED ORDER — NITROGLYCERIN 0.4 MG SL SUBL: 0.4000 mg | SUBLINGUAL_TABLET | SUBLINGUAL | 0 refills | Status: AC | PRN

## 2024-05-19 MED ORDER — ROSUVASTATIN CALCIUM 10 MG PO TABS: 10.0000 mg | ORAL_TABLET | Freq: Every day | ORAL | 0 refills | Status: AC

## 2024-05-19 MED ORDER — METOPROLOL SUCCINATE ER 25 MG PO TB24: ORAL_TABLET | ORAL | 0 refills | Status: AC

## 2024-05-19 NOTE — Telephone Encounter (Signed)
 Pt's medications were sent to pt's pharmacy as requested. Confirmation received.

## 2024-05-20 NOTE — Telephone Encounter (Signed)
 Spoke with pt today pt mail back pap but forgot to signed pap will mail back to pt still have not received provider portion.

## 2024-05-25 ENCOUNTER — Inpatient Hospital Stay: Attending: Hematology & Oncology

## 2024-05-25 DIAGNOSIS — Z452 Encounter for adjustment and management of vascular access device: Secondary | ICD-10-CM | POA: Insufficient documentation

## 2024-05-25 DIAGNOSIS — Z8551 Personal history of malignant neoplasm of bladder: Secondary | ICD-10-CM | POA: Insufficient documentation

## 2024-05-26 ENCOUNTER — Other Ambulatory Visit: Payer: Self-pay | Admitting: Internal Medicine

## 2024-06-03 NOTE — Telephone Encounter (Signed)
 Received pt portion of [employer] (Farxiga ) faxed to [employer] along provider portion, Received [employer] (Toujeo ) pt portion have not received provider portion ,faxed to provider office.

## 2024-06-06 ENCOUNTER — Ambulatory Visit: Admitting: Emergency Medicine

## 2024-06-08 ENCOUNTER — Encounter: Payer: Self-pay | Admitting: Emergency Medicine

## 2024-06-08 ENCOUNTER — Ambulatory Visit: Attending: Cardiology | Admitting: Emergency Medicine

## 2024-06-08 VITALS — BP 128/68 | HR 77 | Ht 68.0 in | Wt 214.0 lb

## 2024-06-08 DIAGNOSIS — Z794 Long term (current) use of insulin: Secondary | ICD-10-CM | POA: Diagnosis not present

## 2024-06-08 DIAGNOSIS — I2510 Atherosclerotic heart disease of native coronary artery without angina pectoris: Secondary | ICD-10-CM

## 2024-06-08 DIAGNOSIS — G4733 Obstructive sleep apnea (adult) (pediatric): Secondary | ICD-10-CM | POA: Diagnosis not present

## 2024-06-08 DIAGNOSIS — I10 Essential (primary) hypertension: Secondary | ICD-10-CM | POA: Diagnosis not present

## 2024-06-08 DIAGNOSIS — E1169 Type 2 diabetes mellitus with other specified complication: Secondary | ICD-10-CM

## 2024-06-08 DIAGNOSIS — E785 Hyperlipidemia, unspecified: Secondary | ICD-10-CM

## 2024-06-08 NOTE — Patient Instructions (Addendum)
 Medication Instructions:  NO CHANGES  Lab Work: NONE TO BE DONE TODAY.  Testing/Procedures: NONE  Follow-Up: At [facility], you and your health needs are our priority.  As part of our continuing mission to provide you with exceptional heart care, our providers are all part of one team.  This team includes your primary Cardiologist (physician) and Advanced Practice Providers or APPs (Physician Assistants and Nurse Practitioners) who all work together to provide you with the care you need, when you need it.  Your next appointment:   NEXT AVAILABLE WITH DR. SHLOMO FOR SLEEP APNEA 1 YEAR WITH DR. OKEY, MD OR MADISON FOUNTAIN, DNP FOR GENERAL CARDIOLGY  Provider:   Vina Okey, MD     Other Instructions;

## 2024-06-08 NOTE — Progress Notes (Signed)
 " Cardiology Office Note:    Date:  06/08/2024  ID:  Isaac [name], DOB [birth year], MRN [id number] PCP: Perri Ronal PARAS, MD  [facility] Providers Cardiologist:  Vina Gull, MD Cardiology APP:  Rana Isaac CROME, NP       Patient Profile:       Chief Complaint: 1 year follow up History of Present Illness:  Isaac [name] is a 69 y.o. 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 Nuclear stress test 12/2020 was normal. Last echo 2018 showed EF 60-65%, mild LVH, G1DD. Lipids are historically followed by primary care.   He underwent coronary CTA on 09/29/2022 showing coronary calcium  score of 216 placing him in the 63rd percentile and nonobstructive CAD.  Seen by Dr. Gull on 10/10/2022.  He was started on amlodipine  2.5 mg daily for history of chest pain and slightly elevated blood pressure.  He reported that his feet began to swell and amlodipine  was discontinued and he was started on hydralazine  10 mg 3 times daily.  Last seen in clinic on 12/29/2022 by Dr. Shlomo for obstructive sleep apnea.  Blood pressure was well-controlled.  A new split-night sleep study was ordered since last 1 was 20 years ago.   Discussed the use of AI scribe software for clinical note transcription with the patient, who gave verbal consent to proceed.  History of Present Illness Isaac [name] is a 69 year old male with coronary artery disease and hypertension who presents for a 1 year cardiovascular follow-up.  Today patient is doing well without acute cardiovascular concerns or  complaints.  Has remained chest pain-free over the past year.  He frequently works on his cars and denies any exertional symptoms.  No dyspnea, orthopnea, PND, LEE, weight gain, syncope, or dizziness.  He has sleep apnea treated with an older CPAP machine without recent setting changes.  He is scheduled for spinal cord stimulator placement on February 6th for neuropathy in his feet.   Review of systems:  Please see the history of present illness. All other systems are reviewed and otherwise negative.      Studies Reviewed:    EKG Interpretation Date/Time:  Wednesday June 08 2024 10:00:11 EST Ventricular Rate:  77 PR Interval:  178 QRS Duration:  92 QT Interval:  386 QTC Calculation: 436 R Axis:   -72  Text Interpretation: Normal sinus rhythm Left axis deviation Pulmonary disease pattern Incomplete right bundle branch block Nonspecific T wave abnormality When compared with ECG of 16-Jun-2018 13:06, No significant change was found Confirmed by Rana Isaac [phone]) on 06/08/2024 11:05:20 AM   Coronary CTA 09/29/2022 1. Coronary artery calcium  score 216 Agatston units. This places the patient in the 63rd percentile for age and gender, suggesting intermediate risk for future cardiac events.   2.  Nonobstructive CAD.  Exercise Myoview  12/10/2020 Lexiscan  stress is electrcially negative for ischemia Myoview  scan shows normal perfusion No ischemia or scar Nuclear stress EF: 54%. This is a low risk study.  Echocardiogram 01/24/2017 - Left ventricle: The cavity size was normal. Wall thickness was    increased in a pattern of mild LVH. Systolic  function was normal.    The estimated ejection fraction was in the range of 60% to 65%.    Wall motion was normal; there were no regional wall motion    abnormalities. Doppler parameters are consistent with abnormal    left ventricular relaxation (grade 1 diastolic dysfunction).    GLS:-18.4%   Cardiac catheterization  06/17/2007 ASSESSMENT:  1. Mild nonobstructive coronary artery disease as described above.  2. TIMI-2 flow in most of the coronary beds likely secondary to      endothelial dysfunction or microvascular disease with a possible      component of vasospasm.  3. Normal left ventricular function.  4. Thirty-percent left renal artery stenosis.    PLAN:  Isaac [name] truly has evidence of endothelial dysfunction and  perhaps microvascular disease and vasospasm.  He will need aggressive  risk factor management.  I emphasized to him that stopping smoking was  very, very important.  He also needed to be treated with aspirin ,  nitroglycerin  and possibly calcium  channel blockers.  He will follow up  with Dr. Okey. Risk Assessment/Calculations:              Physical Exam:   VS:  BP 128/68 (BP Location: Right Arm, Patient Position: Sitting, Cuff Size: Normal)   Pulse 77   Ht 5' 8 (1.727 m)   Wt 214 lb (97.1 kg)   BMI 32.54 kg/m    Wt Readings from Last 3 Encounters:  06/08/24 214 lb (97.1 kg)  05/09/24 213 lb (96.6 kg)  01/20/24 217 lb 14.4 oz (98.8 kg)    GEN: Well nourished, well developed in no acute distress NECK: No JVD; No carotid bruits CARDIAC: RRR, no murmurs, rubs, gallops RESPIRATORY:  Clear to auscultation without rales, wheezing or rhonchi  ABDOMEN: Soft, non-tender, non-distended EXTREMITIES:  No edema; No acute deformity      Assessment and Plan:  Coronary artery disease Cath in 2019 showed mild nonobstructive CAD with TIMI-2 flow in most of the coronary beds likely secondary to endothelial dysfunction or microvascular disease with a possible component of vasospasm Coronary CTA 09/2022 showed CAC of 216 (63rd percentile) and nonobstructive CAD - Today patient is stable without chest pains.  Remains active without exertional symptoms.  No symptoms to suggest angina.  No indication for ischemic evaluation at this time - Amlodipine  discontinued in the past due to LEE -  Continue aspirin  81 mg daily, rosuvastatin  10 mg daily, metoprolol  succinate 25 mg daily, ranolazine  500 mg twice daily, and nitroglycerin  as needed - He is also managed on Ozempic  and Farxiga  for history of T2DM  Hyperlipidemia LDL 62 on 04/2024 and well-controlled - Continue rosuvastatin  10 mg daily  Hypertension Blood pressure today is 128/68 and under excellent control - Continue hydralazine  10 mg 3 times daily, losartan  25 mg daily, metoprolol  succinate 25 mg daily  T2DM A1c 6.6% on 04/2024 and well-managed - Currently managed on Farxiga  10 mg daily, glimepiride  2 mg daily, Toujeo , and Ozempic   Obstructive sleep apnea - Remains adherent to CPAP therapy - Was pending CPAP titration last year but I do not see where this was completed - I will have him follow-up with Dr. Shlomo  History of urothelial carcinoma S/p right nephrectomy in 2018 and adjuvant chemotherapy - Managed by oncology.  Has been >6 years since he last had treatment      Dispo:  Return in about 1 year (around 06/08/2025).  Signed, Isaac LITTIE Louis, NP  "

## 2024-06-14 NOTE — Telephone Encounter (Signed)
 Received provider portion [employer](Toujeo ) from provider office,Faxed to [employer] along pt portion and Ins card.

## 2024-06-15 ENCOUNTER — Other Ambulatory Visit (HOSPITAL_BASED_OUTPATIENT_CLINIC_OR_DEPARTMENT_OTHER): Payer: Self-pay

## 2024-06-15 ENCOUNTER — Encounter: Payer: Self-pay | Admitting: Family

## 2024-06-17 ENCOUNTER — Other Ambulatory Visit (HOSPITAL_BASED_OUTPATIENT_CLINIC_OR_DEPARTMENT_OTHER): Payer: Self-pay

## 2024-06-17 MED ORDER — CEPHALEXIN 500 MG PO CAPS
500.0000 mg | ORAL_CAPSULE | Freq: Four times a day (QID) | ORAL | 0 refills | Status: AC
  Filled 2024-06-17: qty 24, 6d supply, fill #0

## 2024-07-06 ENCOUNTER — Inpatient Hospital Stay

## 2024-07-07 ENCOUNTER — Inpatient Hospital Stay: Attending: Hematology & Oncology

## 2024-07-27 ENCOUNTER — Inpatient Hospital Stay: Attending: Hematology & Oncology

## 2024-07-27 ENCOUNTER — Inpatient Hospital Stay: Admitting: Hematology & Oncology

## 2024-07-27 ENCOUNTER — Inpatient Hospital Stay

## 2024-10-17 ENCOUNTER — Ambulatory Visit: Admitting: Cardiology

## 2024-10-18 ENCOUNTER — Ambulatory Visit: Admitting: Cardiology

## 2025-05-15 ENCOUNTER — Other Ambulatory Visit

## 2025-05-18 ENCOUNTER — Ambulatory Visit: Admitting: Internal Medicine
